# Patient Record
Sex: Male | Born: 1965 | Race: Black or African American | Hispanic: No | Marital: Single | Smoking: Current every day smoker
Health system: Southern US, Community
[De-identification: ages and names within clinical notes are randomized; demographics above are authoritative.]

## PROBLEM LIST (undated history)

## (undated) ENCOUNTER — Emergency Department (HOSPITAL_BASED_OUTPATIENT_CLINIC_OR_DEPARTMENT_OTHER): Admission: EM | Payer: MEDICAID

## (undated) DIAGNOSIS — K859 Acute pancreatitis without necrosis or infection, unspecified: Secondary | ICD-10-CM

## (undated) DIAGNOSIS — F419 Anxiety disorder, unspecified: Secondary | ICD-10-CM

## (undated) DIAGNOSIS — F32A Depression, unspecified: Secondary | ICD-10-CM

## (undated) DIAGNOSIS — E119 Type 2 diabetes mellitus without complications: Secondary | ICD-10-CM

## (undated) DIAGNOSIS — R45851 Suicidal ideations: Secondary | ICD-10-CM

## (undated) HISTORY — DX: Suicidal ideations: R45.851

---

## 2009-04-23 ENCOUNTER — Ambulatory Visit: Payer: Self-pay | Admitting: Internal Medicine

## 2010-11-06 ENCOUNTER — Inpatient Hospital Stay: Payer: Self-pay | Admitting: Internal Medicine

## 2011-01-13 ENCOUNTER — Ambulatory Visit: Payer: Self-pay | Admitting: Family Medicine

## 2011-01-20 ENCOUNTER — Ambulatory Visit: Payer: Self-pay | Admitting: Family Medicine

## 2011-02-20 ENCOUNTER — Ambulatory Visit: Payer: Self-pay | Admitting: Family Medicine

## 2011-06-26 ENCOUNTER — Emergency Department: Payer: Self-pay | Admitting: Emergency Medicine

## 2011-07-08 ENCOUNTER — Ambulatory Visit: Payer: Self-pay | Admitting: Adult Health

## 2011-07-08 DIAGNOSIS — R002 Palpitations: Secondary | ICD-10-CM

## 2011-11-19 ENCOUNTER — Inpatient Hospital Stay: Payer: Self-pay | Admitting: Internal Medicine

## 2011-11-23 LAB — LIPASE, BLOOD: Lipase: 319 U/L (ref 73–393)

## 2012-03-01 ENCOUNTER — Emergency Department: Payer: Self-pay | Admitting: Emergency Medicine

## 2012-03-01 LAB — CBC
MCH: 32.4 pg (ref 26.0–34.0)
RBC: 4.46 10*6/uL (ref 4.40–5.90)
WBC: 14.7 10*3/uL — ABNORMAL HIGH (ref 3.8–10.6)

## 2012-03-01 LAB — COMPREHENSIVE METABOLIC PANEL
Alkaline Phosphatase: 69 U/L (ref 50–136)
Anion Gap: 11 (ref 7–16)
BUN: 10 mg/dL (ref 7–18)
Chloride: 102 mmol/L (ref 98–107)
EGFR (African American): >60
Osmolality: 284 (ref 275–301)
Potassium: 3.6 mmol/L (ref 3.5–5.1)
Sodium: 135 mmol/L — ABNORMAL LOW (ref 136–145)
Total Protein: 8 g/dL (ref 6.4–8.2)

## 2012-03-01 LAB — LIPASE, BLOOD: Lipase: 83 U/L (ref 73–393)

## 2012-03-01 LAB — TROPONIN I: Troponin-I: <0.02 ng/mL

## 2012-04-11 ENCOUNTER — Emergency Department: Payer: Self-pay | Admitting: Unknown Physician Specialty

## 2012-04-11 LAB — COMPREHENSIVE METABOLIC PANEL
Albumin: 3.7 g/dL (ref 3.4–5.0)
Anion Gap: 9 (ref 7–16)
BUN: 8 mg/dL (ref 7–18)
Bilirubin,Total: 0.1 mg/dL — ABNORMAL LOW (ref 0.2–1.0)
Chloride: 110 mmol/L — ABNORMAL HIGH (ref 98–107)
Co2: 24 mmol/L (ref 21–32)
Creatinine: 0.7 mg/dL (ref 0.60–1.30)
EGFR (African American): >60
EGFR (Non-African Amer.): >60
Glucose: 161 mg/dL — ABNORMAL HIGH (ref 65–99)
Potassium: 3.7 mmol/L (ref 3.5–5.1)
Sodium: 143 mmol/L (ref 136–145)
Total Protein: 7.7 g/dL (ref 6.4–8.2)

## 2012-04-11 LAB — CBC
HCT: 43.8 % (ref 40.0–52.0)
HGB: 14.6 g/dL (ref 13.0–18.0)
MCHC: 33.2 g/dL (ref 32.0–36.0)
MCV: 96 fL (ref 80–100)
RBC: 4.56 10*6/uL (ref 4.40–5.90)

## 2012-06-20 ENCOUNTER — Emergency Department: Payer: Self-pay | Admitting: Emergency Medicine

## 2012-09-12 ENCOUNTER — Emergency Department: Payer: Self-pay

## 2012-09-13 ENCOUNTER — Inpatient Hospital Stay: Payer: Self-pay | Admitting: Internal Medicine

## 2012-09-13 LAB — URINALYSIS, COMPLETE
Bilirubin,UR: NEGATIVE
Glucose,UR: NEGATIVE mg/dL (ref 0–75)
Protein: 30
RBC,UR: 6 /HPF (ref 0–5)
Specific Gravity: 1.03 (ref 1.003–1.030)
Squamous Epithelial: NONE SEEN
WBC UR: 1 /HPF (ref 0–5)

## 2012-09-13 LAB — CBC WITH DIFFERENTIAL/PLATELET
Basophil #: 0.1 10*3/uL (ref 0.0–0.1)
Basophil %: 0.4 %
Eosinophil #: 0 10*3/uL (ref 0.0–0.7)
Eosinophil %: 0.1 %
HCT: 45.2 % (ref 40.0–52.0)
HGB: 15.7 g/dL (ref 13.0–18.0)
Lymphocyte #: 1.9 10*3/uL (ref 1.0–3.6)
MCH: 33.1 pg (ref 26.0–34.0)
MCHC: 34.8 g/dL (ref 32.0–36.0)
Monocyte #: 0.7 x10 3/mm (ref 0.2–1.0)
Neutrophil #: 11.9 10*3/uL — ABNORMAL HIGH (ref 1.4–6.5)
Neutrophil %: 82.2 %
RDW: 12.9 % (ref 11.5–14.5)

## 2012-09-13 LAB — COMPREHENSIVE METABOLIC PANEL
Albumin: 4 g/dL (ref 3.4–5.0)
Alkaline Phosphatase: 80 U/L (ref 50–136)
BUN: 14 mg/dL (ref 7–18)
Calcium, Total: 9 mg/dL (ref 8.5–10.1)
Co2: 22 mmol/L (ref 21–32)
EGFR (African American): >60
EGFR (Non-African Amer.): >60
Glucose: 120 mg/dL — ABNORMAL HIGH (ref 65–99)
Potassium: 3.8 mmol/L (ref 3.5–5.1)
SGOT(AST): 25 U/L (ref 15–37)
SGPT (ALT): 32 U/L (ref 12–78)
Total Protein: 8.6 g/dL — ABNORMAL HIGH (ref 6.4–8.2)

## 2012-09-13 LAB — LIPASE, BLOOD: Lipase: 1351 U/L — ABNORMAL HIGH (ref 73–393)

## 2012-09-14 LAB — LIPASE, BLOOD: Lipase: 2718 U/L — ABNORMAL HIGH (ref 73–393)

## 2012-09-14 LAB — COMPREHENSIVE METABOLIC PANEL
Alkaline Phosphatase: 78 U/L (ref 50–136)
Anion Gap: 9 (ref 7–16)
BUN: 10 mg/dL (ref 7–18)
Bilirubin,Total: 0.5 mg/dL (ref 0.2–1.0)
Calcium, Total: 8.7 mg/dL (ref 8.5–10.1)
Chloride: 103 mmol/L (ref 98–107)
Co2: 25 mmol/L (ref 21–32)
Creatinine: 0.63 mg/dL (ref 0.60–1.30)
EGFR (African American): >60
Osmolality: 275 (ref 275–301)
Potassium: 3.6 mmol/L (ref 3.5–5.1)
Sodium: 137 mmol/L (ref 136–145)

## 2012-09-14 LAB — CBC WITH DIFFERENTIAL/PLATELET
Basophil #: 0 10*3/uL (ref 0.0–0.1)
Basophil %: 0.2 %
Eosinophil #: 0 10*3/uL (ref 0.0–0.7)
HCT: 40.7 % (ref 40.0–52.0)
HGB: 14.1 g/dL (ref 13.0–18.0)
Lymphocyte %: 11.9 %
MCHC: 34.7 g/dL (ref 32.0–36.0)
Monocyte %: 7.7 %
Neutrophil #: 10.5 10*3/uL — ABNORMAL HIGH (ref 1.4–6.5)
Platelet: 193 10*3/uL (ref 150–440)
RDW: 12.8 % (ref 11.5–14.5)
WBC: 13.2 10*3/uL — ABNORMAL HIGH (ref 3.8–10.6)

## 2013-05-04 ENCOUNTER — Emergency Department: Payer: Self-pay | Admitting: Emergency Medicine

## 2013-05-04 LAB — BASIC METABOLIC PANEL
Anion Gap: 8 (ref 7–16)
Chloride: 105 mmol/L (ref 98–107)
Co2: 24 mmol/L (ref 21–32)
EGFR (African American): >60
EGFR (Non-African Amer.): >60
Potassium: 3.5 mmol/L (ref 3.5–5.1)

## 2013-05-04 LAB — CBC
HCT: 42.5 % (ref 40.0–52.0)
MCHC: 34.4 g/dL (ref 32.0–36.0)
MCV: 94 fL (ref 80–100)
RBC: 4.51 10*6/uL (ref 4.40–5.90)
RDW: 13.2 % (ref 11.5–14.5)
WBC: 19.3 10*3/uL — ABNORMAL HIGH (ref 3.8–10.6)

## 2013-05-04 LAB — ETHANOL: Ethanol: 142 mg/dL

## 2013-05-17 ENCOUNTER — Emergency Department: Payer: Self-pay | Admitting: Emergency Medicine

## 2013-05-17 LAB — COMPREHENSIVE METABOLIC PANEL
Anion Gap: 7 (ref 7–16)
BUN: 16 mg/dL (ref 7–18)
Bilirubin,Total: 0.6 mg/dL (ref 0.2–1.0)
Chloride: 96 mmol/L — ABNORMAL LOW (ref 98–107)
Co2: 27 mmol/L (ref 21–32)
Creatinine: 0.87 mg/dL (ref 0.60–1.30)
EGFR (Non-African Amer.): >60
Glucose: 273 mg/dL — ABNORMAL HIGH (ref 65–99)
Osmolality: 272 (ref 275–301)
SGPT (ALT): 34 U/L (ref 12–78)
Sodium: 130 mmol/L — ABNORMAL LOW (ref 136–145)
Total Protein: 8.9 g/dL — ABNORMAL HIGH (ref 6.4–8.2)

## 2013-05-17 LAB — URINALYSIS, COMPLETE
Bilirubin,UR: NEGATIVE
Nitrite: NEGATIVE
Protein: 100
RBC,UR: 3 /HPF (ref 0–5)
Specific Gravity: 1.042 (ref 1.003–1.030)

## 2013-05-17 LAB — CBC
MCH: 32.4 pg (ref 26.0–34.0)
MCHC: 35.2 g/dL (ref 32.0–36.0)
MCV: 92 fL (ref 80–100)
RBC: 5.05 10*6/uL (ref 4.40–5.90)

## 2013-10-23 ENCOUNTER — Emergency Department: Payer: Self-pay | Admitting: Emergency Medicine

## 2013-10-23 LAB — DRUG SCREEN, URINE
Amphetamines, Ur Screen: NEGATIVE (ref ?–1000)
Benzodiazepine, Ur Scrn: NEGATIVE (ref ?–200)
Cannabinoid 50 Ng, Ur ~~LOC~~: NEGATIVE (ref ?–50)
MDMA (Ecstasy)Ur Screen: NEGATIVE (ref ?–500)
Opiate, Ur Screen: NEGATIVE (ref ?–300)
Phencyclidine (PCP) Ur S: NEGATIVE (ref ?–25)
Tricyclic, Ur Screen: NEGATIVE (ref ?–1000)

## 2013-10-23 LAB — URINALYSIS, COMPLETE
Bilirubin,UR: NEGATIVE
Glucose,UR: >=500 mg/dL (ref 0–75)
Ketone: NEGATIVE
Ph: 6 (ref 4.5–8.0)
Specific Gravity: 1.037 (ref 1.003–1.030)
Squamous Epithelial: <1

## 2013-10-23 LAB — ETHANOL
Ethanol %: 0.027 % (ref 0.000–0.080)
Ethanol: 27 mg/dL

## 2013-10-23 LAB — COMPREHENSIVE METABOLIC PANEL
Albumin: 3.5 g/dL (ref 3.4–5.0)
Alkaline Phosphatase: 73 U/L
Anion Gap: 10 (ref 7–16)
Bilirubin,Total: 0.3 mg/dL (ref 0.2–1.0)
Calcium, Total: 9.2 mg/dL (ref 8.5–10.1)
Creatinine: 0.63 mg/dL (ref 0.60–1.30)
EGFR (African American): >60
Osmolality: 282 (ref 275–301)
Potassium: 3.5 mmol/L (ref 3.5–5.1)
SGOT(AST): 13 U/L — ABNORMAL LOW (ref 15–37)
Total Protein: 7.6 g/dL (ref 6.4–8.2)

## 2013-10-23 LAB — CBC
HCT: 41.2 % (ref 40.0–52.0)
MCH: 31.7 pg (ref 26.0–34.0)
MCHC: 33.9 g/dL (ref 32.0–36.0)
MCV: 94 fL (ref 80–100)
RBC: 4.4 10*6/uL (ref 4.40–5.90)
RDW: 13.5 % (ref 11.5–14.5)
WBC: 9 10*3/uL (ref 3.8–10.6)

## 2013-10-23 LAB — LIPASE, BLOOD: Lipase: 109 U/L (ref 73–393)

## 2014-08-02 ENCOUNTER — Emergency Department: Payer: Self-pay | Admitting: Emergency Medicine

## 2014-08-02 LAB — CBC
HCT: 47.1 % (ref 40.0–52.0)
HGB: 15.1 g/dL (ref 13.0–18.0)
MCH: 30.9 pg (ref 26.0–34.0)
MCHC: 32 g/dL (ref 32.0–36.0)
MCV: 96 fL (ref 80–100)
Platelet: 236 10*3/uL (ref 150–440)
RBC: 4.88 10*6/uL (ref 4.40–5.90)
RDW: 14.3 % (ref 11.5–14.5)
WBC: 12.5 10*3/uL — AB (ref 3.8–10.6)

## 2014-08-02 LAB — COMPREHENSIVE METABOLIC PANEL
ALBUMIN: 3.6 g/dL (ref 3.4–5.0)
ALT: 132 U/L — AB
ANION GAP: 12 (ref 7–16)
Alkaline Phosphatase: 82 U/L
BUN: 11 mg/dL (ref 7–18)
Bilirubin,Total: 0.2 mg/dL (ref 0.2–1.0)
CO2: 21 mmol/L (ref 21–32)
Calcium, Total: 8.6 mg/dL (ref 8.5–10.1)
Chloride: 109 mmol/L — ABNORMAL HIGH (ref 98–107)
Creatinine: 0.92 mg/dL (ref 0.60–1.30)
EGFR (African American): >60
GLUCOSE: 204 mg/dL — AB (ref 65–99)
Osmolality: 288 (ref 275–301)
POTASSIUM: 4.2 mmol/L (ref 3.5–5.1)
SGOT(AST): 79 U/L — ABNORMAL HIGH (ref 15–37)
Sodium: 142 mmol/L (ref 136–145)
Total Protein: 8.3 g/dL — ABNORMAL HIGH (ref 6.4–8.2)

## 2014-08-02 LAB — ETHANOL: Ethanol: 299 mg/dL

## 2014-08-02 LAB — LIPASE, BLOOD: Lipase: 141 U/L (ref 73–393)
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
## 2015-01-01 ENCOUNTER — Emergency Department: Payer: Self-pay | Admitting: Emergency Medicine

## 2015-03-10 NOTE — Discharge Summary (Signed)
PATIENT NAME:  Samuel [name], Samuel [name] MR#:  [id number] DATE OF BIRTH:  09-15-1966  DATE OF ADMISSION:  09/13/2012 DATE OF DISCHARGE:  09/15/2012  PRIMARY CARE PHYSICIAN: [facility]  DISCHARGE DIAGNOSES:  1. Acute alcoholic pancreatitis.  2. Diabetes mellitus, type 2.  3. Alcohol abuse.  4. Leukocytosis. 5. Tobacco abuse.   IMAGING STUDIES DONE: None.   CONSULTANTS: None.   ADMITTING HISTORY AND PHYSICAL: Please see detailed history and physical dictated by Dr. Vianne Bulls. In brief, 49 year old male patient presented to the Emergency Room complaining of acute onset of mid epigastric pain along with nausea, vomiting. In the ER and he was found to have a lipase of 1400, was admitted to the hospitalist service. Patient had alcohol abuse in the past, quit alcohol two months back but started to binge drink again and had the pancreatitis. Patient's LFTs were normal.   HOSPITAL COURSE:  1. Acute alcoholic pancreatitis. Patient was initially n.p.o., started on IV fluid hydration. On day two of hospitalization patients lipase elevated to over 2000. Patient was later started on liquid diet and on the day of discharge his lipase is trending down. His symptoms are much improved. He has tolerated oral food with no nausea, vomiting, minimal pain and is being discharged home in fair condition.  2. Patient's diabetes mellitus type 2 was well controlled. He is on metformin at home which will be continued.  3. Leukocytosis. Patient did have elevated white count of 13,000 during admission which has trended down with hydration. This was likely secondary to stress reaction and dehydration.  4. At the time of discharge patient's temperature 98, pulse 75, blood pressure 130/82. He has tolerated food. Abdominal examination has mild tenderness in the epigastric area with no rigidity, guarding. Bowel sounds present and he is being discharged home to follow up with his primary care physician.    DISCHARGE MEDICATIONS:   1. Percocet 10/325, 1 tablet oral 4 times a day as needed, 15 pills given.  2. Zofran ODT 4 mg oral 4 times a day as needed.  3. Metformin 500 mg oral 2 times a day.   DISCHARGE INSTRUCTIONS: Follow up with primary care physician in 1 to 2 weeks. Patient has been counseled to stay away from alcohol. He is to call his doctor or return to the Emergency Room if he has any worsening of symptoms. He will be on a diabetic diet with no restriction in activity. This plan was discussed with the patient who has verbalized understanding and is okay with the plan.   TIME SPENT: Time spent today on this discharge dictation along with coordinating care and counseling of the patient was 35 minutes.  ____________________________ Leia Alf Zanayah Shadowens, MD srs:cms D: 09/15/2012 13:29:20 ET T: 09/16/2012 15:41:03 ET JOB#: [id number]  cc: Alveta Heimlich R. Hero Kulish, MD, <Dictator> [facility] Neita Carp MD ELECTRONICALLY SIGNED 09/17/2012 13:58

## 2015-03-10 NOTE — H&P (Signed)
PATIENT NAME:  Samuel [name], Samuel [name] MR#:  [id number] DATE OF BIRTH:  09-Apr-1966  DATE OF ADMISSION:  09/13/2012  PRIMARY DOCTOR: None local.   ER PHYSICIAN: Dr. Benjaman Lobe   CHIEF COMPLAINT: Abdominal pain.   HISTORY OF PRESENT ILLNESS: The patient is a 49 year old male who started to have abdominal pain since this morning mainly in the midepigastric region radiating to the back associated with nausea and vomiting. The pain is increased by food intake and is partly relieved with pain medication given in the ER. The patient was here yesterday with right shoulder pain and was given ketorolac. The patient says that after taking the medicine he felt more nauseous and started to have abdominal pain. The patient also says he has been drinking heavily for the last three days including both liquor and wine. The patient had history of alcohol related pancreatitis before also. The patient's LFTs are normal. I was asked to admit the patient for acute pancreatitis.   PAST MEDICAL HISTORY: Significant for diabetes. No hypertension. Past medical history also includes admission December 29th to January 2nd for alcohol related pancreatitis.   ALLERGIES: Penicillin.   SOCIAL HISTORY: Smokes less than a pack of cigarettes a day. Heavy drinker, drinking for the last 3 to 4 days. No drugs. The patient works at [employer].   PAST SURGICAL HISTORY: No history of operations.   FAMILY HISTORY: Hypertension and diabetes in both father and mother.   MEDICATIONS: Metformin 500 mg p.o. b.i.d.   REVIEW OF SYSTEMS: CONSTITUTIONAL: Has no fever. No fatigue. EYES: No blurred vision. ENT: No tinnitus. No ear pain. No epistaxis. No difficulty swallowing. RESPIRATORY: No cough. No wheezing. CARDIOVASCULAR: No chest pain. No orthopnea. GI: Has nausea and abdominal pain. The patient has no jaundice. No constipation. Had a normal bowel movement this morning. GU: No dysuria. ENDOCRINE: No polyuria or nocturia. The patient has diabetes but no  increased sweating or cold intolerance. HEMATOLOGIC: No anemia. INTEGUMENTARY: No skin rashes. MUSCULOSKELETAL: Complains of right shoulder pain. NEUROLOGIC: No numbness or weakness. PSYCH: No anxiety or insomnia.   PHYSICAL EXAMINATION:   VITALS: Temperature 98.2, pulse 70, respirations 20, blood pressure 134/66, saturation 98% on room air.   GENERAL: Alert, awake, oriented in mild distress secondary to abdominal pain.   HEENT: Head atraumatic, normocephalic. Pupils equally reacting to light. Extraocular movements are intact.   ENT: No tympanic membrane congestion. No turbinate hypertrophy. No oropharyngeal erythema.   NECK: Normal range of motion. No JVD. No. No carotid bruit.   CARDIOVASCULAR: S1, S2 regular. No murmurs. PMI not displaced.   LUNGS: Clear to auscultation. No wheeze. No rales.   ABDOMEN: Abdomen is tender in the midepigastric region. Bowel sounds are diminished. No organomegaly. No hernia. Not distended.   EXTREMITIES: No cyanosis. No clubbing. No edema.    NEUROLOGIC: Alert, awake, oriented. No focal neurological deficit. Cranial nerves II through XII intact. Power 5/5 in upper and lower extremities. Sensation intact. Deep tendon reflexes 2+ bilaterally.   PSYCH: Mood and affect are within normal limits.   LABORATORY DATA: WBC elevated at 14.5, hemoglobin 15.7, hematocrit 45.2, platelets 223. Electrolytes sodium 140, potassium 3.8, chloride 106, bicarb 22, BUN 14, creatinine 0.81, glucose 120. Lipase 1351.   ASSESSMENT AND PLAN:  1. The patient is a 49 year old male with abdominal pain and elevated lipase with heavy alcohol abuse for the last three days, has acute alcoholic pancreatitis. The patient will be admitted to the hospitalist service. Keep him n.p.o. IV fluids. Check lipase in the  morning. Continue IV pain medication along with Zofran.  2. Elevated white count. Will empirically start him on meropenem and check the CBC tomorrow.  3. Diabetes mellitus. Hold  metformin because he is not eating and he is n.p.o. Continue sliding scale with coverage.  4. GI prophylaxis with Protonix.  5. Counseled the patient against drinking heavily. He has had two episodes of pancreatitis, one in January and one now. Explained that pancreatitis is coming because of his alcohol abuse. LFTs are normal. 6. Tobacco abuse. Counseled against smoking also for 3 to 10 minutes. Discussed risk of COPD and lung cancer risk involved with smoking.   TIME SPENT ON HISTORY AND PHYSICAL: About 60 minutes.   ____________________________ Epifanio Lesches, MD sk:drc D: 09/13/2012 19:54:53 ET T: 09/14/2012 06:34:33 ET JOB#: [id number]  cc: Epifanio Lesches, MD, <Dictator> Epifanio Lesches MD ELECTRONICALLY SIGNED 10/02/2012 19:37

## 2015-03-15 NOTE — H&P (Signed)
PATIENT NAME:  Samuel [name], Samuel [name] MR#:  [id number] DATE OF BIRTH:  [birth year]  DATE OF ADMISSION:  11/19/2011  CHIEF COMPLAINT: Abdominal pain.   HISTORY OF PRESENT ILLNESS: Mr. 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 His last alcoholic drink was two days prior to admission, at which time he drank rum. He has not been abstinent from alcohol despite prior hospitalizations for pancreatitis. He does drink beer occasionally but has historically avoided liquor with the misconception that liquor and beer were processed differently by the body. Several days ago, he had several drinks of Bacardi with his family. The following morning he developed abdominal pain, was still able to eat until the day prior to admission when he developed vomiting and vomited several times at home. He felt shaky and tremulous one day prior to admission, but today he denies any symptoms of such. He has no history of DTs.   PRIMARY CARE PHYSICIAN: He is a patient at the [facility], was last seen there this summer.   PAST MEDICAL HISTORY:  1. 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  2. History of alcohol abuse and alcoholic pancreatitis.  3. History of gastroesophageal reflux disease.  4. Tobacco abuse.  5. His last hospitalization was 12/17 through 46/96/2952 for alcoholic pancreatitis.   MEDICATIONS: He takes no medications. He was on metformin until this summer when it was discontinued.   PAST SURGICAL HISTORY: He has no past surgical history.   ALLERGIES:  He has no known drug allergies.   FAMILY HISTORY: Family history is notable for mom who is healthy except for a mild anemia and arthritis. She has diet-controlled diabetes as well. His father died of stomach cancer.  SOCIAL HISTORY: He is  a 30 pack-year smoker. He continues to drink alcohol.   REVIEW OF SYSTEMS: CONSTITUTIONAL: Negative for fever, fatigue, and weakness. No recent weight loss or weight gain. HEENT: He denies any changes in vision. He has no history of cataracts or glaucoma. He denies any history of seasonal rhinitis, snoring, postnasal drip or sinus pain. RESPIRATORY: He denies any history of cough, wheezing, or hemoptysis recently. CARDIOVASCULAR: He denies chest pain, orthopnea, edema, and dyspnea on exertion. GASTROINTESTINAL: He does note abdominal pain and nausea and vomiting, per history of present illness. No changes in bowel habits. GENITOURINARY: No history of dysuria, hematuria, or renal calculi. ENDOCRINE: He has no history of thyroid problems or nocturia. HEMATOLOGIC/LYMPHATIC: He has a history of mild anemia but with no history of bruising or bleeding. MUSCULOSKELETAL: He has no history of chronic neck, back, shoulder, knee or hip pain. NEUROLOGICAL: No numbness, weakness, or dysarthria. No history of seizures. PSYCHIATRIC: No history of anxiety or insomnia.   PHYSICAL EXAMINATION:  GENERAL: The patient is a well nourished, well developed middle-aged male in no apparent distress.   VITAL SIGNS: Blood pressure 135/77, pulse is 74 and regular, temperature is 98.5, respirations are 18, and he is sating 99% on room air. He is 5 feet 8 inches tall, and his current weight is 195.   HEENT: Pupils are equal, round, and reactive to light. Extraocular movements are intact. Sclerae are anicteric. Mouth is dry with halitosis.   NECK: Supple without lymphadenopathy, JVD, or thyromegaly.   LUNGS: Clear to auscultation bilaterally. No wheezing or rhonchi.   CARDIOVASCULAR: Regular rate and  rhythm with no murmurs, rubs, or gallops. Chest wall is nontender. He has good pedal pulses and no lower extremity edema.   ABDOMEN: Soft, tender in the mid epigastrium region without guarding or rebound. No masses appreciated. Bowel  sounds are very quiet.   MUSCULOSKELETAL: Exam is nonfocal. Gait was not tested.   SKIN: Skin is warm and dry without rashes or lesions.   LYMPH: There is no cervical, axillary, or inguinal lymphadenopathy.   NEUROLOGICAL: He is neurologically intact. He is alert and oriented to person, place, and time.   ADMISSION LABORATORY DATA: Sodium 139, potassium 3.7, chloride 98, bicarbonate 29, BUN 12, creatinine 0.86, glucose 164, white count 19.9, hemoglobin 16, platelets 234. Hepatic enzymes are normal. Lipase is elevated at 3027.   ASSESSMENT AND PLAN:  1. Alcoholic pancreatitis: We will admit to a Med/Surg bed and make n.p.o. except for ice chips and continue n.p.o. status until abdominal pain improves. A CT scan has not been ordered given the nature of his illness. We reserve that for worsening symptoms. We will use Dilaudid for pain control and IV Protonix for reflux symptoms.  2. History of alcohol abuse: Since he did note some tremors yesterday, we will place him on IV CIWA protocol,  3. Reflux: Again, Protonix IV.  4. History of diabetes: We will place the patient on sliding scale insulin and check hemoglobin A1c in the morning.  5. Alcohol and tobacco abuse: Counseling given today. His more immediate problem is alcohol abuse and his need for complete abstinence from all alcohol. He understands this now, and his mother was present and is supportive of his recovery.  6. Tobacco abuse: Counseling was given; however, given his current symptoms he is unlikely to give up tobacco at the same time as alcohol. He has requested a NicoDerm patch here in the ED.   ESTIMATED TIME OF CARE: 40 minutes.   ____________________________ Deborra Medina, MD tt:cbb D: 11/19/2011 21:52:47 ET T: 11/20/2011 10:41:41 ET JOB#: [id number]  cc: Deborra Medina, MD, <Dictator> [facility] Deborra Medina MD ELECTRONICALLY SIGNED 12/23/2011 18:21

## 2015-03-15 NOTE — Discharge Summary (Signed)
PATIENT NAME:  Samuel [name], Samuel [name] MR#:  [id number] DATE OF BIRTH:  [birth year]  DATE OF ADMISSION:  11/19/2011 DATE OF DISCHARGE:  11/23/2011  DISCHARGE DIAGNOSES:  1. Acute alcoholic pancreatitis, now resolved. 2. Alcoholism, counseled, not ready to stop completely yet.   SECONDARY DIAGNOSES:  1. Diabetes.  2. History of alcohol abuse and alcoholic pancreatitis.  3. Gastroesophageal reflux disease.   CONSULTATIONS: None.   LABORATORY, DIAGNOSTIC AND RADIOLOGICAL DATA: Abdominal ultrasound on 12/30 showed no cholelithiasis or acute cholecystitis.   HISTORY AND SHORT HOSPITAL COURSE: The patient is a 49 year old male with above-mentioned medical problems who was admitted for alcoholic pancreatitis. He was made n.p.o. and was started on pain control medication with Dilaudid. He was also placed on CIWA protocol. He was slowly started on advancing diet from clear liquid to full liquid and tolerated it well, although he did have one time flare while in the hospital and was again placed back as n.p.o., although his lipase continued to trend in a positive direction with an admission lipase of 3027 which was normalized on the date of discharge with a value of 319. He tolerated the diet well. On 01/02, he did not have much pain. His hemoglobin A1c was 6.7 and was doing much better on 01/02 and was discharged home in stable condition.   PERTINENT PHYSICAL EXAMINATION ON THE DATE OF DISCHARGE: On the date of discharge, his vital signs were as follows: Temperature 98, heart rate 106 per minute, respirations 20 per minute, blood pressure 131/83. He was saturating 95% on room air. CARDIOVASCULAR: S1, S2 normal. No murmur, rubs, or gallop. LUNGS: Clear to auscultation bilaterally. No wheezing, rales, rhonchi, or crepitation. ABDOMEN: Soft, benign. NEUROLOGIC: Nonfocal examination. All other physical examination remained at baseline.   DISCHARGE MEDICATIONS:  1. TUMS 500 mg chewable once daily as needed.   2. Tramadol 50 mg p.o. every six hours as needed, 15 tablets provided. No refill.   DISCHARGE DIET: Low sodium, 1,800 ADA. He was requested to eat light meal for the first time after which gradually advance as tolerated.   DISCHARGE ACTIVITY: As tolerated.   DISCHARGE INSTRUCTIONS AND FOLLOW-UP:  1. The patient was instructed to avoid fatty foods.  2. He was requested to follow up with [facility] with his primary care physician in 1 to 2 weeks.   TOTAL TIME DISCHARGING THIS PATIENT: 45 minutes.    ____________________________ Samuel Mellow. Manuella Ghazi, MD vss:ap D: 11/28/2011 11:28:18 ET T: 11/29/2011 13:09:06 ET JOB#: [id number]  cc: Mallarie Voorhies S. Manuella Ghazi, MD, <Dictator> [facility] Samuel Mellow [facility] MD ELECTRONICALLY SIGNED 11/29/2011 19:52

## 2015-04-02 ENCOUNTER — Encounter: Payer: Self-pay | Admitting: Emergency Medicine

## 2015-04-02 ENCOUNTER — Emergency Department
Admission: EM | Admit: 2015-04-02 | Discharge: 2015-04-02 | Disposition: A | Discharge status: Home / Self Care | Payer: Self-pay | Attending: Emergency Medicine | Admitting: Emergency Medicine

## 2015-04-02 DIAGNOSIS — Z880 Allergy status to penicillin: Secondary | ICD-10-CM | POA: Insufficient documentation

## 2015-04-02 DIAGNOSIS — H538 Other visual disturbances: Secondary | ICD-10-CM | POA: Insufficient documentation

## 2015-04-02 DIAGNOSIS — E1165 Type 2 diabetes mellitus with hyperglycemia: Secondary | ICD-10-CM | POA: Insufficient documentation

## 2015-04-02 DIAGNOSIS — Z720 Tobacco use: Secondary | ICD-10-CM | POA: Insufficient documentation

## 2015-04-02 DIAGNOSIS — R739 Hyperglycemia, unspecified: Secondary | ICD-10-CM

## 2015-04-02 HISTORY — DX: Type 2 diabetes mellitus without complications: E11.9

## 2015-04-02 HISTORY — DX: Acute pancreatitis without necrosis or infection, unspecified: K85.90

## 2015-04-02 LAB — URINALYSIS COMPLETE WITH MICROSCOPIC (ARMC ONLY)
BACTERIA UA: NONE SEEN
Bilirubin Urine: NEGATIVE
Glucose, UA: 50 mg/dL — AB
KETONES UR: NEGATIVE mg/dL
Leukocytes, UA: NEGATIVE
Nitrite: NEGATIVE
Protein, ur: NEGATIVE mg/dL
Specific Gravity, Urine: 1.014 (ref 1.005–1.030)
Squamous Epithelial / LPF: NONE SEEN
pH: 5 (ref 5.0–8.0)

## 2015-04-02 LAB — GLUCOSE, CAPILLARY: GLUCOSE-CAPILLARY: 189 mg/dL — AB (ref 65–99)

## 2015-04-02 LAB — CBC
HEMATOCRIT: 41.4 % (ref 40.0–52.0)
HEMOGLOBIN: 14.1 g/dL (ref 13.0–18.0)
MCH: 32.2 pg (ref 26.0–34.0)
MCHC: 34.1 g/dL (ref 32.0–36.0)
MCV: 94.4 fL (ref 80.0–100.0)
Platelets: 222 10*3/uL (ref 150–440)
RBC: 4.38 MIL/uL — AB (ref 4.40–5.90)
RDW: 12.8 % (ref 11.5–14.5)
WBC: 8.3 10*3/uL (ref 3.8–10.6)

## 2015-04-02 LAB — COMPREHENSIVE METABOLIC PANEL
ALT: 18 U/L (ref 17–63)
AST: 23 U/L (ref 15–41)
Albumin: 3.8 g/dL (ref 3.5–5.0)
Alkaline Phosphatase: 66 U/L (ref 38–126)
Anion gap: 8 (ref 5–15)
BILIRUBIN TOTAL: 0.4 mg/dL (ref 0.3–1.2)
BUN: 11 mg/dL (ref 6–20)
CO2: 26 mmol/L (ref 22–32)
Calcium: 9.1 mg/dL (ref 8.9–10.3)
Chloride: 103 mmol/L (ref 101–111)
Creatinine, Ser: 0.66 mg/dL (ref 0.61–1.24)
GFR calc Af Amer: >60 mL/min (ref >60)
Glucose, Bld: 223 mg/dL — ABNORMAL HIGH (ref 65–99)
Potassium: 3.8 mmol/L (ref 3.5–5.1)
SODIUM: 137 mmol/L (ref 135–145)
Total Protein: 7.6 g/dL (ref 6.5–8.1)

## 2015-04-02 MED ORDER — METFORMIN HCL 500 MG PO TABS: 500.0000 mg | ORAL_TABLET | Freq: Two times a day (BID) | ORAL | Status: DC

## 2015-04-02 MED ORDER — METFORMIN HCL 500 MG PO TABS
ORAL_TABLET | ORAL | Status: AC
  Administered 2015-04-02: 500 mg via ORAL
  Filled 2015-04-02: qty 1

## 2015-04-02 MED ORDER — METFORMIN HCL 500 MG PO TABS
500.0000 mg | ORAL_TABLET | Freq: Once | ORAL | Status: AC
  Administered 2015-04-02: 500 mg via ORAL

## 2015-04-02 MED ORDER — SODIUM CHLORIDE 0.9 % IV SOLN
Freq: Once | INTRAVENOUS | Status: AC
  Administered 2015-04-02: 13:00:00 via INTRAVENOUS

## 2015-04-02 NOTE — ED Notes (Signed)
Awaiting IV fluids to finish infusing before discharge.

## 2015-04-02 NOTE — ED Notes (Signed)
Pt states that he has been "feeling foggy" X 2 days. Denies CP, denies SOB. States vision has been blurry. Family gave him metformin today. Hx of diabetes. Pt alert and oriented X4, active, cooperative, pt in NAD. RR even and unlabored, color WNL. No neuro deficits

## 2015-04-02 NOTE — Discharge Instructions (Signed)
Blood Glucose Monitoring °Monitoring your blood glucose (also know as blood sugar) helps you to manage your diabetes. It also helps you and your health care provider monitor your diabetes and determine how well your treatment plan is working. °WHY SHOULD YOU MONITOR YOUR BLOOD GLUCOSE? °· It can help you understand how food, exercise, and medicine affect your blood glucose. °· It allows you to know what your blood glucose is at any given moment. You can quickly tell if you are having low blood glucose (hypoglycemia) or high blood glucose (hyperglycemia). °· It can help you and your health care provider know how to adjust your medicines. °· It can help you understand how to manage an illness or adjust medicine for exercise. °WHEN SHOULD YOU TEST? °Your health care provider will help you decide how often you should check your blood glucose. This may depend on the type of diabetes you have, your diabetes control, or the types of medicines you are taking. Be sure to write down all of your blood glucose readings so that this information can be reviewed with your health care provider. See below for examples of testing times that your health care provider may suggest. °Type 1 Diabetes °· Test 4 times a day if you are in good control, using an insulin pump, or perform multiple daily injections. °· If your diabetes is not well controlled or if you are sick, you may need to monitor more often. °· It is a good idea to also monitor: °· Before and after exercise. °· Between meals and 2 hours after a meal. °· Occasionally between 2:00 a.m. and 3:00 a.m. °Type 2 Diabetes °· It can vary with each person, but generally, if you are on insulin, test 4 times a day. °· If you take medicines by mouth (orally), test 2 times a day. °· If you are on a controlled diet, test once a day. °· If your diabetes is not well controlled or if you are sick, you may need to monitor more often. °HOW TO MONITOR YOUR BLOOD GLUCOSE °Supplies  Needed °· Blood glucose meter. °· Test strips for your meter. Each meter has its own strips. You must use the strips that go with your own meter. °· A pricking needle (lancet). °· A device that holds the lancet (lancing device). °· A journal or log book to write down your results. °Procedure °· Wash your hands with soap and water. Alcohol is not preferred. °· Prick the side of your finger (not the tip) with the lancet. °· Gently milk the finger until a small drop of blood appears. °· Follow the instructions that come with your meter for inserting the test strip, applying blood to the strip, and using your blood glucose meter. °Other Areas to Get Blood for Testing °Some meters allow you to use other areas of your body (other than your finger) to test your blood. These areas are called alternative sites. The most common alternative sites are: °· The forearm. °· The thigh. °· The back area of the lower leg. °· The palm of the hand. °The blood flow in these areas is slower. Therefore, the blood glucose values you get may be delayed, and the numbers are different from what you would get from your fingers. Do not use alternative sites if you think you are having hypoglycemia. Your reading will not be accurate. Always use a finger if you are having hypoglycemia. Also, if you cannot feel your lows (hypoglycemia unawareness), always use your fingers for your   blood glucose checks. ADDITIONAL TIPS FOR GLUCOSE MONITORING  Do not reuse lancets.  Always carry your supplies with you.  All blood glucose meters have a 24-hour "hotline" number to call if you have questions or need help.  Adjust (calibrate) your blood glucose meter with a control solution after finishing a few boxes of strips. BLOOD GLUCOSE RECORD KEEPING It is a good idea to keep a daily record or log of your blood glucose readings. Most glucose meters, if not all, keep your glucose records stored in the meter. Some meters come with the ability to download  your records to your home computer. Keeping a record of your blood glucose readings is especially helpful if you are wanting to look for patterns. Make notes to go along with the blood glucose readings because you might forget what happened at that exact time. Keeping good records helps you and your health care provider to work together to achieve good diabetes management.  Document Released: 11/10/2003 Document Revised: 03/24/2014 Document Reviewed: 04/01/2013 [facility] Patient Information 2015 [place], [place]. This information is not intended to replace advice given to you by your health care provider. Make sure you discuss any questions you have with your health care provider.  High Blood Sugar High blood sugar (hyperglycemia) means that the level of sugar in your blood is higher than it should be. Signs of high blood sugar include:  Feeling thirsty.  Frequent peeing (urinating).  Feeling tired or sleepy.  Dry mouth.  Vision changes.  Feeling weak.  Feeling hungry but losing weight.  Numbness and tingling in your hands or feet.  Headache. When you ignore these signs, your blood sugar may keep going up. These problems may get worse, and other problems may begin. HOME CARE  Check your blood sugars as told by your doctor. Write down the numbers with the date and time.  Take the right amount of insulin or diabetes pills at the right time. Write down the dose with date and time.  Refill your insulin or diabetes pills before running out.  Watch what you eat. Follow your meal plan.  Drink liquids without sugar, such as water. Check with your doctor if you have kidney or heart disease.  Follow your doctor's orders for exercise. Exercise at the same time of day.  Keep your doctor's appointments. GET HELP RIGHT AWAY IF:   You have trouble thinking or are confused.  You have fast breathing with fruity smelling breath.  You pass out (faint).  You have 2 to 3 days of high blood  sugars and you do not know why.  You have chest pain.  You are feeling sick to your stomach (nauseous) or throwing up (vomiting).  You have sudden vision changes. MAKE SURE YOU:   Understand these instructions.  Will watch your condition.  Will get help right away if you are not doing well or get worse. Document Released: 09/04/2009 Document Revised: 01/30/2012 Document Reviewed: 09/04/2009 [facility] Patient Information 2015 [place], [place]. This information is not intended to replace advice given to you by your health care provider. Make sure you discuss any questions you have with your health care provider.

## 2015-04-02 NOTE — ED Provider Notes (Signed)
[facility] Emergency Department Provider Note    Time seen: 12 PM  I have reviewed the triage vital signs and the nursing notes.   HISTORY  Chief Complaint Altered Mental Status    HPI Samuel [name] is a 49 y.o. male ER for some blurry vision,he states like he just didn't feel quite right. He was concerned that his blood sugar was elevated. He had been out of his metformin for some time, but did take 1 dose yesterday. Denies any fevers chills chest pain shortness of breath nausea vomiting diarrhea.  Past Medical History  Diagnosis Date  . Diabetes mellitus without complication   . Pancreatitis unk    There are no active problems to display for this patient.   History reviewed. No pertinent past surgical history.  No current outpatient prescriptions on file.  Allergies Penicillins  History reviewed. No pertinent family history.  Social History History  Substance Use Topics  . Smoking status: Current Every Day Smoker  . Smokeless tobacco: Not on file  . Alcohol Use: Yes    Review of Systems Constitutional: Negative for fever. Eyes: Positive for somewhat blurry vision ENT: Negative for sore throat. Cardiovascular: Negative for chest pain. Respiratory: Negative for shortness of breath. Gastrointestinal: Negative for abdominal pain, vomiting and diarrhea. Genitourinary: Negative for dysuria. Musculoskeletal: Negative for back pain. Skin: Negative for rash. Neurological: Negative for headaches, positive for generalized weakness  10-point ROS otherwise negative.  ____________________________________________   PHYSICAL EXAM:  VITAL SIGNS: ED Triage Vitals  Enc Vitals Group     BP 04/02/15 0935 143/89 mmHg     Pulse Rate 04/02/15 0935 63     Resp 04/02/15 0935 20     Temp 04/02/15 0935 99.1 F (37.3 C)     Temp Source 04/02/15 0935 Oral     SpO2 04/02/15 0935 99 %     Weight 04/02/15 0935 172 lb (78.019 kg)     Height 04/02/15  0935 '5\' 8"'$  (1.727 m)     Head Cir --      Peak Flow --      Pain Score --      Pain Loc --      Pain Edu? --      Excl. in [place]? --     Constitutional: Alert and oriented. Well appearing and in no distress. Eyes: Conjunctivae are normal. PERRL. Normal extraocular movements. ENT   Head: Normocephalic and atraumatic.   Nose: No congestion/rhinnorhea.   Mouth/Throat: Mucous membranes are moist.   Neck: No stridor. Hematological/Lymphatic/Immunilogical: No cervical lymphadenopathy. Cardiovascular: Normal rate, regular rhythm. Normal and symmetric distal pulses are present in all extremities. No murmurs, rubs, or gallops. Respiratory: Normal respiratory effort without tachypnea nor retractions. Breath sounds are clear and equal bilaterally. No wheezes/rales/rhonchi. Gastrointestinal: Soft and nontender. No distention. No abdominal bruits. There is no CVA tenderness. Musculoskeletal: Nontender with normal range of motion in all extremities. No joint effusions.  No lower extremity tenderness nor edema. Neurologic:  Normal speech and language. No gross focal neurologic deficits are appreciated. Speech is normal. No gait instability. Skin:  Skin is warm, dry and intact. No rash noted. Psychiatric: Mood and affect are normal. Speech and behavior are normal. Patient exhibits appropriate insight and judgment.  ____________________________________________    LABS (pertinent positives/negatives)  Labs Reviewed  GLUCOSE, CAPILLARY - Abnormal; Notable for the following:    Glucose-Capillary 189 (*)    All other components within normal limits  CBC - Abnormal; Notable for the  following:    RBC 4.38 (*)    All other components within normal limits  COMPREHENSIVE METABOLIC PANEL - Abnormal; Notable for the following:    Glucose, Bld 223 (*)    All other components within normal limits  URINALYSIS COMPLETEWITH MICROSCOPIC ([facility])  - Abnormal; Notable for the following:    Color,  Urine YELLOW (*)    APPearance CLEAR (*)    Glucose, UA 50 (*)    Hgb urine dipstick 1+ (*)    All other components within normal limits  CBG MONITORING, ED     ____________________________________________    RADIOLOGY  None  ____________________________________________    ED COURSE  Pertinent labs & imaging results that were available during my care of the patient were reviewed by me and considered in my medical decision making (see chart for details).  Patient hyperglycemia due to medication noncompliance. He'll receive normal saline Glucophage.  FINAL ASSESSMENT AND PLAN  Hyperglycemia  Plan: Patient received fluids and Glucophage and will receive refill prescription for same for him. He is in no acute distress can follow-up as an outpatient    Earleen Newport, MD   Earleen Newport, MD 04/02/15 1228

## 2015-04-02 NOTE — ED Notes (Signed)
Pt alert and oriented X4, active, cooperative, pt in NAD. RR even and unlabored, color WNL.  Pt informed to return if any life threatening symptoms occur.   

## 2015-04-02 NOTE — ED Notes (Signed)
MD at bedside. 

## 2015-04-02 NOTE — ED Notes (Signed)
States he has been a little "foggy"  With some blurred vision The patient has a history of diabetes and has not checked sugar recently

## 2015-09-07 ENCOUNTER — Emergency Department
Admission: EM | Admit: 2015-09-07 | Discharge: 2015-09-07 | Disposition: A | Discharge status: Home / Self Care | Payer: Self-pay | Attending: Emergency Medicine | Admitting: Emergency Medicine

## 2015-09-07 DIAGNOSIS — Z720 Tobacco use: Secondary | ICD-10-CM | POA: Insufficient documentation

## 2015-09-07 DIAGNOSIS — H538 Other visual disturbances: Secondary | ICD-10-CM | POA: Insufficient documentation

## 2015-09-07 DIAGNOSIS — R739 Hyperglycemia, unspecified: Secondary | ICD-10-CM

## 2015-09-07 DIAGNOSIS — R55 Syncope and collapse: Secondary | ICD-10-CM | POA: Insufficient documentation

## 2015-09-07 DIAGNOSIS — Z880 Allergy status to penicillin: Secondary | ICD-10-CM | POA: Insufficient documentation

## 2015-09-07 DIAGNOSIS — K08409 Partial loss of teeth, unspecified cause, unspecified class: Secondary | ICD-10-CM | POA: Insufficient documentation

## 2015-09-07 DIAGNOSIS — E1165 Type 2 diabetes mellitus with hyperglycemia: Secondary | ICD-10-CM | POA: Insufficient documentation

## 2015-09-07 LAB — BASIC METABOLIC PANEL WITH GFR
Anion gap: 11 (ref 5–15)
BUN: 8 mg/dL (ref 6–20)
CO2: 23 mmol/L (ref 22–32)
Calcium: 8.9 mg/dL (ref 8.9–10.3)
Chloride: 101 mmol/L (ref 101–111)
Creatinine, Ser: 0.59 mg/dL — ABNORMAL LOW (ref 0.61–1.24)
GFR calc Af Amer: >60 mL/min
GFR calc non Af Amer: >60 mL/min
Glucose, Bld: 383 mg/dL — ABNORMAL HIGH (ref 65–99)
Potassium: 3.8 mmol/L (ref 3.5–5.1)
Sodium: 135 mmol/L (ref 135–145)

## 2015-09-07 LAB — URINALYSIS COMPLETE WITH MICROSCOPIC (ARMC ONLY)
Bacteria, UA: NONE SEEN
Bilirubin Urine: NEGATIVE
Glucose, UA: >500 mg/dL — AB
Hgb urine dipstick: NEGATIVE
Leukocytes, UA: NEGATIVE
Nitrite: NEGATIVE
Protein, ur: NEGATIVE mg/dL
Specific Gravity, Urine: 1.036 — ABNORMAL HIGH (ref 1.005–1.030)
Squamous Epithelial / HPF: NONE SEEN
pH: 6 (ref 5.0–8.0)

## 2015-09-07 LAB — CBC
HCT: 39.5 % — ABNORMAL LOW (ref 40.0–52.0)
Hemoglobin: 13.3 g/dL (ref 13.0–18.0)
MCH: 32 pg (ref 26.0–34.0)
MCHC: 33.6 g/dL (ref 32.0–36.0)
MCV: 95.4 fL (ref 80.0–100.0)
Platelets: 268 K/uL (ref 150–440)
RBC: 4.14 MIL/uL — ABNORMAL LOW (ref 4.40–5.90)
RDW: 12.7 % (ref 11.5–14.5)
WBC: 9.1 K/uL (ref 3.8–10.6)

## 2015-09-07 LAB — GLUCOSE, CAPILLARY: Glucose-Capillary: 367 mg/dL — ABNORMAL HIGH (ref 65–99)

## 2015-09-07 MED ORDER — METFORMIN HCL 500 MG PO TABS: 500.0000 mg | ORAL_TABLET | Freq: Two times a day (BID) | ORAL | Status: DC

## 2015-09-07 NOTE — ED Provider Notes (Signed)
[facility] Emergency Department Provider Note  ____________________________________________  Time seen: 3:30 PM  I have reviewed the triage vital signs and the nursing notes.   HISTORY  Chief Complaint Dizziness    HPI Samuel [name] is a 49 y.o. male who has diabetes but does not take his metformin. Today he was brushing his teeth getting ready for work, brushing the back of his tongue when he suddenly got very dizzy. He also felt like his eyes were blurry so he sat down on the couch. He took a gram of metformin and then came to the ER. He also reports that he's had some nasal congestion for the past few days. No chest pain or shortness of breath. No headaches syncope numbness tingling or weakness. No vomiting or diarrhea. No fevers or chills     Past Medical History  Diagnosis Date  . Diabetes mellitus without complication   . Pancreatitis unk     There are no active problems to display for this patient.    No past surgical history on file.   Current Outpatient Rx  Name  Route  Sig  Dispense  Refill  . ibuprofen (ADVIL,MOTRIN) 200 MG tablet   Oral   Take 800 mg by mouth every 6 (six) hours as needed.         . metFORMIN (GLUCOPHAGE) 500 MG tablet   Oral   Take 1 tablet (500 mg total) by mouth 2 (two) times daily with a meal.   60 tablet   3      Allergies Penicillins   No family history on file.  Social History Social History  Substance Use Topics  . Smoking status: Current Every Day Smoker  . Smokeless tobacco: Not on file  . Alcohol Use: Yes    Review of Systems  Constitutional:   No fever or chills. No weight changes Eyes:   Positive blurry vision briefly which is now resolved ENT:   No sore throat. Cardiovascular:   No chest pain. Respiratory:   No dyspnea or cough. Gastrointestinal:   Negative for abdominal pain, vomiting and diarrhea.  No BRBPR or melena. Genitourinary:   Negative for dysuria, urinary retention,  bloody urine, or difficulty urinating. Musculoskeletal:   Negative for back pain. No joint swelling or pain. Skin:   Negative for rash. Neurological:   Negative for headaches, focal weakness or numbness. Psychiatric:  No anxiety or depression.   Endocrine:  No hot/cold intolerance, changes in energy, or sleep difficulty.  10-point ROS otherwise negative.  ____________________________________________   PHYSICAL EXAM:  VITAL SIGNS: ED Triage Vitals  Enc Vitals Group     BP 09/07/15 1425 133/86 mmHg     Pulse Rate 09/07/15 1425 86     Resp 09/07/15 1425 20     Temp 09/07/15 1425 98.4 F (36.9 C)     Temp Source 09/07/15 1425 Oral     SpO2 09/07/15 1425 98 %     Weight 09/07/15 1425 172 lb (78.019 kg)     Height 09/07/15 1425 '5\' 8"'$  (1.727 m)     Head Cir --      Peak Flow --      Pain Score --      Pain Loc --      Pain Edu? --      Excl. in [place]? --      Constitutional:   Alert and oriented. Well appearing and in no distress. Eyes:   No scleral icterus. No conjunctival pallor.  PERRL. EOMI ENT   Head:   Normocephalic and atraumatic.   Nose:   Nasal congestion. No septal hematoma   Mouth/Throat:   MMM, no pharyngeal erythema. No peritonsillar mass. No uvula shift. Multiple missing teeth.   Neck:   No stridor. No SubQ emphysema. No meningismus. Hematological/Lymphatic/Immunilogical:   No cervical lymphadenopathy. Cardiovascular:   RRR. Normal and symmetric distal pulses are present in all extremities. No murmurs, rubs, or gallops. Respiratory:   Normal respiratory effort without tachypnea nor retractions. Breath sounds are clear and equal bilaterally. No wheezes/rales/rhonchi. Gastrointestinal:   Soft and nontender. No distention. There is no CVA tenderness.  No rebound, rigidity, or guarding. Genitourinary:   deferred Musculoskeletal:   Nontender with normal range of motion in all extremities. No joint effusions.  No lower extremity tenderness.  No  edema. Neurologic:   Normal speech and language.  CN 2-10 normal. Motor grossly intact. No pronator drift.  Normal gait. No gross focal neurologic deficits are appreciated.  Skin:    Skin is warm, dry and intact. No rash noted.  No petechiae, purpura, or bullae. Psychiatric:   Mood and affect are normal. Speech and behavior are normal. Patient exhibits appropriate insight and judgment.  ____________________________________________    LABS (pertinent positives/negatives) (all labs ordered are listed, but only abnormal results are displayed) Labs Reviewed  BASIC METABOLIC PANEL - Abnormal; Notable for the following:    Glucose, Bld 383 (*)    Creatinine, Ser 0.59 (*)    All other components within normal limits  CBC - Abnormal; Notable for the following:    RBC 4.14 (*)    HCT 39.5 (*)    All other components within normal limits  URINALYSIS COMPLETEWITH MICROSCOPIC ([facility] ONLY) - Abnormal; Notable for the following:    Color, Urine STRAW (*)    APPearance CLEAR (*)    Glucose, UA >500 (*)    Ketones, ur TRACE (*)    Specific Gravity, Urine 1.036 (*)    All other components within normal limits  GLUCOSE, CAPILLARY - Abnormal; Notable for the following:    Glucose-Capillary 367 (*)    All other components within normal limits  CBG MONITORING, ED  CBG MONITORING, ED   ____________________________________________   EKG  Interpreted by me  Date: 09/07/2015  Rate: 69  Rhythm: normal sinus rhythm  QRS Axis: normal  Intervals: normal  ST/T Wave abnormalities: normal  Conduction Disutrbances: none  Narrative Interpretation: unremarkable      ____________________________________________    RADIOLOGY    ____________________________________________   PROCEDURES   ____________________________________________   INITIAL IMPRESSION / ASSESSMENT AND PLAN / ED COURSE  Pertinent labs & imaging results that were available during my care of the patient were  reviewed by me and considered in my medical decision making (see chart for details).  Patient presents with hyperglycemia due to metformin noncompliance. He is otherwise well-appearing. With his recent illness and the tooth brushing activity when the dizziness started I think that this is very likely to be due to a vagal stimulation from the back of the tongue with a toothbrush. Additionally the blurry vision appears to be due to his hyperglycemia. He is feeling much better and back to normal now with IV fluids. No evidence of any ketoacidosis, no evidence of stroke or intracranial hemorrhage. CT stable for discharge home. I will refill his metformin as he reports that his metformin at home is very old. I strongly encouraged him to be compliant with the as an inexpensive  means of controlling his chronic medical illness to prevent further decompensation and injury to organs.     ____________________________________________   FINAL CLINICAL IMPRESSION(S) / ED DIAGNOSES  Final diagnoses:  Hyperglycemia   vagal reaction  Carrie Mew, MD 09/07/15 1557

## 2015-09-07 NOTE — Discharge Instructions (Signed)
Hyperglycemia °Hyperglycemia occurs when the glucose (sugar) in your blood is too high. Hyperglycemia can happen for many reasons, but it most often happens to people who do not know they have diabetes or are not managing their diabetes properly.  °CAUSES  °Whether you have diabetes or not, there are other causes of hyperglycemia. Hyperglycemia can occur when you have diabetes, but it can also occur in other situations that you might not be as aware of, such as: °Diabetes °· If you have diabetes and are having problems controlling your blood glucose, hyperglycemia could occur because of some of the following reasons: °¨ Not following your meal plan. °¨ Not taking your diabetes medications or not taking it properly. °¨ Exercising less or doing less activity than you normally do. °¨ Being sick. °Pre-diabetes °· This cannot be ignored. Before people develop Type 2 diabetes, they almost always have "pre-diabetes." This is when your blood glucose levels are higher than normal, but not yet high enough to be diagnosed as diabetes. Research has shown that some long-term damage to the body, especially the heart and circulatory system, may already be occurring during pre-diabetes. If you take action to manage your blood glucose when you have pre-diabetes, you may delay or prevent Type 2 diabetes from developing. °Stress °· If you have diabetes, you may be "diet" controlled or on oral medications or insulin to control your diabetes. However, you may find that your blood glucose is higher than usual in the hospital whether you have diabetes or not. This is often referred to as "stress hyperglycemia." Stress can elevate your blood glucose. This happens because of hormones put out by the body during times of stress. If stress has been the cause of your high blood glucose, it can be followed regularly by your caregiver. That way he/she can make sure your hyperglycemia does not continue to get worse or progress to  diabetes. °Steroids °· Steroids are medications that act on the infection fighting system (immune system) to block inflammation or infection. One side effect can be a rise in blood glucose. Most people can produce enough extra insulin to allow for this rise, but for those who cannot, steroids make blood glucose levels go even higher. It is not unusual for steroid treatments to "uncover" diabetes that is developing. It is not always possible to determine if the hyperglycemia will go away after the steroids are stopped. A special blood test called an A1c is sometimes done to determine if your blood glucose was elevated before the steroids were started. °SYMPTOMS °· Thirsty. °· Frequent urination. °· Dry mouth. °· Blurred vision. °· Tired or fatigue. °· Weakness. °· Sleepy. °· Tingling in feet or leg. °DIAGNOSIS  °Diagnosis is made by monitoring blood glucose in one or all of the following ways: °· A1c test. This is a chemical found in your blood. °· Fingerstick blood glucose monitoring. °· Laboratory results. °TREATMENT  °First, knowing the cause of the hyperglycemia is important before the hyperglycemia can be treated. Treatment may include, but is not be limited to: °· Education. °· Change or adjustment in medications. °· Change or adjustment in meal plan. °· Treatment for an illness, infection, etc. °· More frequent blood glucose monitoring. °· Change in exercise plan. °· Decreasing or stopping steroids. °· Lifestyle changes. °HOME CARE INSTRUCTIONS  °· Test your blood glucose as directed. °· Exercise regularly. Your caregiver will give you instructions about exercise. Pre-diabetes or diabetes which comes on with stress is helped by exercising. °· Eat wholesome,   balanced meals. Eat often and at regular, fixed times. Your caregiver or nutritionist will give you a meal plan to guide your sugar intake. °· Being at an ideal weight is important. If needed, losing as little as 10 to 15 pounds may help improve blood  glucose levels. °SEEK MEDICAL CARE IF:  °· You have questions about medicine, activity, or diet. °· You continue to have symptoms (problems such as increased thirst, urination, or weight gain). °SEEK IMMEDIATE MEDICAL CARE IF:  °· You are vomiting or have diarrhea. °· Your breath smells fruity. °· You are breathing faster or slower. °· You are very sleepy or incoherent. °· You have numbness, tingling, or pain in your feet or hands. °· You have chest pain. °· Your symptoms get worse even though you have been following your caregiver's orders. °· If you have any other questions or concerns. °  °This information is not intended to replace advice given to you by your health care provider. Make sure you discuss any questions you have with your health care provider. °  °Document Released: 05/03/2001 Document Revised: 01/30/2012 Document Reviewed: 07/14/2015 °Elsevier Interactive Patient Education ©2016 [employer] ° °

## 2015-09-07 NOTE — ED Notes (Signed)
Pt reports was brushing his teeth and all of a sudden he started feeling dizzy and he couldn't get his eyes to adjust right. Pt also reports feet felt heavy. Denies difficulty speaking. Pt reports has trouble with his sugar levels so he took a metformin '100mg'$  when the sx's came on. Pt reports has no way to check his sugar at home.

## 2015-09-07 NOTE — ED Notes (Signed)
Pt says had dizzy spell this am.  Says he has beenoff his metformin due to no doctor and no insurance.  But he just started a job and will have insurance soon.  [facility] expllained and patient agrees to follo wup .  He as ivf infusing now annd in in nad.  Denies pain.

## 2015-10-26 ENCOUNTER — Emergency Department
Admission: EM | Admit: 2015-10-26 | Discharge: 2015-10-26 | Disposition: A | Discharge status: Home / Self Care | Payer: Self-pay | Attending: Emergency Medicine | Admitting: Emergency Medicine

## 2015-10-26 DIAGNOSIS — E119 Type 2 diabetes mellitus without complications: Secondary | ICD-10-CM | POA: Insufficient documentation

## 2015-10-26 DIAGNOSIS — K029 Dental caries, unspecified: Secondary | ICD-10-CM | POA: Insufficient documentation

## 2015-10-26 DIAGNOSIS — H53149 Visual discomfort, unspecified: Secondary | ICD-10-CM | POA: Insufficient documentation

## 2015-10-26 DIAGNOSIS — Z7984 Long term (current) use of oral hypoglycemic drugs: Secondary | ICD-10-CM | POA: Insufficient documentation

## 2015-10-26 DIAGNOSIS — F17200 Nicotine dependence, unspecified, uncomplicated: Secondary | ICD-10-CM | POA: Insufficient documentation

## 2015-10-26 DIAGNOSIS — Z880 Allergy status to penicillin: Secondary | ICD-10-CM | POA: Insufficient documentation

## 2015-10-26 MED ORDER — ERYTHROMYCIN BASE 500 MG PO TABS: 500.0000 mg | ORAL_TABLET | Freq: Four times a day (QID) | ORAL | Status: AC

## 2015-10-26 MED ORDER — IBUPROFEN 800 MG PO TABS: 800.0000 mg | ORAL_TABLET | Freq: Three times a day (TID) | ORAL | Status: DC | PRN

## 2015-10-26 MED ORDER — TRAMADOL HCL 50 MG PO TABS: 50.0000 mg | ORAL_TABLET | Freq: Four times a day (QID) | ORAL | Status: AC | PRN

## 2015-10-26 NOTE — ED Notes (Signed)
Pt in w/ complaints of dental pain and ear pain on left side. Pt reports waking up this morning w/ difficulty seeing out of left eye; pt states he has a tooth that needs to be removed but unable to get to dentist due to financial reasons.

## 2015-10-26 NOTE — ED Notes (Signed)
Pt c/o left toothache for the past week.Marland KitchenMarland Kitchen

## 2015-10-26 NOTE — ED Provider Notes (Signed)
[facility] Emergency Department Provider Note  ____________________________________________  Time seen: Approximately 4:51 PM  I have reviewed the triage vital signs and the nursing notes.   HISTORY  Chief Complaint Dental Pain    HPI Sheila Gervasi is a 49 y.o. male patient complaining of dental pain which is chronic in nature but has increased in the last 3 days. Patient also state he has difficulty seeing out of the left eye. Patient said  his dental issues is due due to lack of insurance. Patient state his dental pain is rated as a 7/10. Describes a sharp. Patient states been overdosing on ibuprofen which has not helped. Patient also noticed increased edema to the gum area on the left side today. No other palliative measures taken for this complaint.  Past Medical History  Diagnosis Date  . Diabetes mellitus without complication ([place])   . Pancreatitis unk    There are no active problems to display for this patient.   History reviewed. No pertinent past surgical history.  Current Outpatient Rx  Name  Route  Sig  Dispense  Refill  . erythromycin base (E-MYCIN) 500 MG tablet   Oral   Take 1 tablet (500 mg total) by mouth 4 (four) times daily.   40 tablet   0   . ibuprofen (ADVIL,MOTRIN) 200 MG tablet   Oral   Take 800 mg by mouth every 6 (six) hours as needed.         [name] ibuprofen (ADVIL,MOTRIN) 800 MG tablet   Oral   Take 1 tablet (800 mg total) by mouth every 8 (eight) hours as needed.   30 tablet   0   . metFORMIN (GLUCOPHAGE) 500 MG tablet   Oral   Take 1 tablet (500 mg total) by mouth 2 (two) times daily with a meal.   60 tablet   3   . traMADol (ULTRAM) 50 MG tablet   Oral   Take 1 tablet (50 mg total) by mouth every 6 (six) hours as needed.   20 tablet   0     Allergies Penicillins  No family history on file.  Social History Social History  Substance Use Topics  . Smoking status: Current Every Day Smoker  . Smokeless  tobacco: None  . Alcohol Use: Yes    Review of Systems Constitutional: No fever/chills Eyes: Decreased vision left eye ENT: No sore throat. Dental pain  Cardiovascular: Denies chest pain. Respiratory: Denies shortness of breath. Gastrointestinal: No abdominal pain.  No nausea, no vomiting.  No diarrhea.  No constipation. Genitourinary: Negative for dysuria. Musculoskeletal: Negative for back pain. Skin: Negative for rash. Neurological: Negative for headaches, focal weakness or numbness. Endocrine:Diabetes 10-point ROS otherwise negative.  ____________________________________________   PHYSICAL EXAM:  VITAL SIGNS: ED Triage Vitals  Enc Vitals Group     BP 10/26/15 1627 139/90 mmHg     Pulse Rate 10/26/15 1627 108     Resp 10/26/15 1627 18     Temp 10/26/15 1627 98.2 F (36.8 C)     Temp Source 10/26/15 1627 Oral     SpO2 10/26/15 1627 94 %     Weight 10/26/15 1627 170 lb (77.111 kg)     Height 10/26/15 1627 '5\' 8"'$  (1.727 m)     Head Cir --      Peak Flow --      Pain Score 10/26/15 1630 7     Pain Loc --      Pain Edu? --  Excl. in [place]? --     Constitutional: Alert and oriented. Well appearing and in no acute distress. Eyes: Conjunctivae are normal. PERRL. EOMI. see vision acuity results Head: Atraumatic. Nose: No congestion/rhinnorhea. Mouth/Throat: Mucous membranes are moist.  Oropharynx non-erythematous. Edematous gingiva multiple dental caries and devitalized teeth . No obvious abscess.  Neck: No stridor.  No cervical spine tenderness to palpation. Hematological/Lymphatic/Immunilogical: No cervical lymphadenopathy. Cardiovascular: Normal rate, regular rhythm. Grossly normal heart sounds.  Good peripheral circulation. Respiratory: Normal respiratory effort.  No retractions. Lungs CTAB. Gastrointestinal: Soft and nontender. No distention. No abdominal bruits. No CVA tenderness. Musculoskeletal: No lower extremity tenderness nor edema.  No joint  effusions. Neurologic:  Normal speech and language. No gross focal neurologic deficits are appreciated. No gait instability. Skin:  Skin is warm, dry and intact. No rash noted. Psychiatric: Mood and affect are normal. Speech and behavior are normal.  ____________________________________________   LABS (all labs ordered are listed, but only abnormal results are displayed)  Labs Reviewed - No data to display ____________________________________________  EKG   ____________________________________________  RADIOLOGY   ____________________________________________   PROCEDURES  Procedure(s) performed: None  Critical Care performed: No  ____________________________________________   INITIAL IMPRESSION / ASSESSMENT AND PLAN / ED COURSE  Pertinent labs & imaging results that were available during my care of the patient were reviewed by me and considered in my medical decision making (see chart for details).  Dental pain. Patient given a list of dental clinics but advised [facility] with [facility] in the morning since they have walk-in appointments. Patient given a prescription for erythromycin, ibuprofen, and tramadol. ____________________________________________   FINAL CLINICAL IMPRESSION(S) / ED DIAGNOSES  Final diagnoses:  Pain due to dental caries      Sable Feil, PA-C 10/26/15 [place], MD 10/27/15 [phone]

## 2015-10-26 NOTE — Discharge Instructions (Signed)
Follow-up from the list of dental clinics provided. OPTIONS FOR DENTAL FOLLOW UP CARE  [place] [facility] and [place] [name] [phone])  [facility] [phone])  [place] [phone])  [place] [phone])  [facility] [phone]) This clinic caters to the indigent population and is on a lottery system. Location: [employer] [facility], [facility], [place], [place] Clinic Hours: Wednesdays from 6pm - 9pm, patients seen by a lottery system. For dates, call or go to [email or web address] Services: Cleanings, fillings and simple extractions. Payment Options: DENTAL WORK IS FREE OF CHARGE. Bring proof of income or support. Best way to get seen: Arrive at 5:15 pm - this is a lottery, NOT first come/first serve, so arriving earlier will not increase your chances of being seen.     [facility] [phone] Select option 1 for emergencies   Location: [facility], [place], [address] [place] Clinic Hours: No walk-ins accepted - call the day before to schedule an appointment. Check in times are 9:30 am and 1:30 pm. Services: Simple extractions, temporary fillings, pulpectomy/pulp debridement, uncomplicated abscess drainage. Payment Options: PAYMENT IS DUE AT THE TIME OF SERVICE.  Fee is usually $100-200, additional surgical procedures (e.g. abscess drainage) may be extra. Cash, checks, Visa/MasterCard accepted.  Can file Medicaid if patient is covered for dental - patient should call case worker to check. No discount for [facility] patients. Best way to get seen: MUST call the day before and get onto the schedule. Can usually be seen the next 1-2 days. No walk-ins accepted.      [place] [phone]   Location: [place], [place] Clinic Hours: M, W, Th, F 8am or 1:30pm, Tues 9a or 1:30 - first come/first served. Services: Simple extractions, temporary fillings, uncomplicated abscess drainage.  You do not need to be an [facility] resident. Payment Options: PAYMENT IS DUE AT THE TIME OF SERVICE. Dental insurance, otherwise sliding scale - bring proof of income or support. Depending on income and treatment needed, cost is usually $50-200. Best way to get seen: Arrive early as it is first come/first served.     [facility] [phone]   Location: [place] [facility] Hours: Mon-Thu 8a-5p Services: Most basic dental services including extractions and fillings. Payment Options: PAYMENT IS DUE AT THE TIME OF SERVICE. Sliding scale, up to 50% off - bring proof if income or support. Medicaid with dental option accepted. Best way to get seen: Call to schedule an appointment, can usually be seen within 2 weeks OR they will try to see walk-ins - show up at [place] or 2p (you may have to wait).     [facility] [place] RESIDENTS ONLY   Location: [facility], [place] [address] [place],  [address] Clinic Hours: By appointment only. Monday - Thursday 8am-5pm, Friday 8am-12pm Services: Cleanings, fillings, extractions. Payment Options: PAYMENT IS DUE AT THE TIME OF SERVICE. Cash, Visa or MasterCard. Sliding scale - $30 minimum per service. Best way to get seen: Come in to office, complete packet and make an appointment - need proof of income or support monies for each household member and proof of [facility] residence. Usually takes about a month to get in.     [facility] [phone]   Location: [address], [facility] Hours: Walk-in Urgent Care  Dental Services are offered Monday-Friday mornings only. The numbers of emergencies accepted daily is limited to the number of providers available. Maximum 15 - Mondays, Wednesdays & Thursdays Maximum 10 - Tuesdays & Fridays Services: You do not need to be a [facility] resident to be seen for a dental emergency. Emergencies are defined as pain, swelling, abnormal bleeding, or dental trauma. Walkins will receive x-rays if needed. NOTE: Dental cleaning is not an emergency. Payment Options: PAYMENT IS DUE AT THE TIME OF SERVICE. Minimum co-pay is $40.00 for uninsured patients. Minimum co-pay is $3.00 for Medicaid with dental coverage. Dental Insurance is accepted and must be presented at time of visit. Medicare does not cover dental. Forms of payment: Cash, credit card, checks. Best way to get seen: If not previously registered with the clinic, walk-in dental registration begins at 7:15 am and is on a first come/first serve basis. If previously registered with the clinic, call to make an appointment.     [facility] [place] ONLY   Location: [address], [place], [place] Clinic Hours: Mon-Thu 10a-2p Services: Extractions only! Payment Options: FREE (donations accepted) - bring proof of income or support Best way to get seen: Call and schedule an appointment OR come at 8am on the 1st Monday of every month (except for holidays) when it is first come/first served.     [facility] [phone]   Location: [place], [place] Clinic Hours: Friday mornings Services, Payment Options, Best way to get seen: Call for info

## 2015-10-27 ENCOUNTER — Telehealth: Payer: Self-pay | Admitting: Emergency Medicine

## 2015-10-27 NOTE — ED Notes (Signed)
[facility] called asking for cheaper med as erythromycin as ordered would be about $800.  Per  Dr Jimmye Norman can change to clindamycin 300 mg 4 times a day for 10 days.  Can break this down into 2 -'150mg'$  tabs instead of 300 mg tab if that makes the medication cheaper.

## 2016-11-13 ENCOUNTER — Emergency Department
Admission: EM | Admit: 2016-11-13 | Discharge: 2016-11-13 | Payer: Self-pay | Attending: Emergency Medicine | Admitting: Emergency Medicine

## 2016-11-13 ENCOUNTER — Emergency Department: Payer: Self-pay

## 2016-11-13 ENCOUNTER — Encounter: Payer: Self-pay | Admitting: Emergency Medicine

## 2016-11-13 DIAGNOSIS — E119 Type 2 diabetes mellitus without complications: Secondary | ICD-10-CM | POA: Insufficient documentation

## 2016-11-13 DIAGNOSIS — F10929 Alcohol use, unspecified with intoxication, unspecified: Secondary | ICD-10-CM

## 2016-11-13 DIAGNOSIS — Z791 Long term (current) use of non-steroidal anti-inflammatories (NSAID): Secondary | ICD-10-CM | POA: Insufficient documentation

## 2016-11-13 DIAGNOSIS — F17200 Nicotine dependence, unspecified, uncomplicated: Secondary | ICD-10-CM | POA: Insufficient documentation

## 2016-11-13 DIAGNOSIS — Y9389 Activity, other specified: Secondary | ICD-10-CM | POA: Insufficient documentation

## 2016-11-13 DIAGNOSIS — F10129 Alcohol abuse with intoxication, unspecified: Secondary | ICD-10-CM | POA: Insufficient documentation

## 2016-11-13 DIAGNOSIS — Y999 Unspecified external cause status: Secondary | ICD-10-CM | POA: Insufficient documentation

## 2016-11-13 DIAGNOSIS — Y92410 Unspecified street and highway as the place of occurrence of the external cause: Secondary | ICD-10-CM | POA: Insufficient documentation

## 2016-11-13 DIAGNOSIS — S2241XA Multiple fractures of ribs, right side, initial encounter for closed fracture: Secondary | ICD-10-CM | POA: Insufficient documentation

## 2016-11-13 DIAGNOSIS — R319 Hematuria, unspecified: Secondary | ICD-10-CM | POA: Insufficient documentation

## 2016-11-13 DIAGNOSIS — Z7984 Long term (current) use of oral hypoglycemic drugs: Secondary | ICD-10-CM | POA: Insufficient documentation

## 2016-11-13 DIAGNOSIS — S80211A Abrasion, right knee, initial encounter: Secondary | ICD-10-CM | POA: Insufficient documentation

## 2016-11-13 DIAGNOSIS — S066X9A Traumatic subarachnoid hemorrhage with loss of consciousness of unspecified duration, initial encounter: Secondary | ICD-10-CM | POA: Insufficient documentation

## 2016-11-13 DIAGNOSIS — S0083XA Contusion of other part of head, initial encounter: Secondary | ICD-10-CM

## 2016-11-13 DIAGNOSIS — S129XXA Fracture of neck, unspecified, initial encounter: Secondary | ICD-10-CM | POA: Insufficient documentation

## 2016-11-13 LAB — CBC WITH DIFFERENTIAL/PLATELET
BASOS PCT: 0 %
Basophils Absolute: 0.1 10*3/uL (ref 0–0.1)
EOS ABS: 0.1 10*3/uL (ref 0–0.7)
EOS PCT: 1 %
HCT: 44.6 % (ref 40.0–52.0)
Hemoglobin: 14.8 g/dL (ref 13.0–18.0)
Lymphocytes Relative: 19 %
Lymphs Abs: 3.6 10*3/uL (ref 1.0–3.6)
MCH: 32.1 pg (ref 26.0–34.0)
MCHC: 33.2 g/dL (ref 32.0–36.0)
MCV: 96.8 fL (ref 80.0–100.0)
Monocytes Absolute: 1 10*3/uL (ref 0.2–1.0)
Monocytes Relative: 5 %
Neutro Abs: 14.4 10*3/uL — ABNORMAL HIGH (ref 1.4–6.5)
Neutrophils Relative %: 75 %
PLATELETS: 166 10*3/uL (ref 150–440)
RBC: 4.61 MIL/uL (ref 4.40–5.90)
RDW: 13 % (ref 11.5–14.5)
WBC: 19.2 10*3/uL — AB (ref 3.8–10.6)

## 2016-11-13 LAB — URINALYSIS, ROUTINE W REFLEX MICROSCOPIC
BACTERIA UA: NONE SEEN
BILIRUBIN URINE: NEGATIVE
Glucose, UA: >=500 mg/dL — AB
Ketones, ur: NEGATIVE mg/dL
Leukocytes, UA: NEGATIVE
NITRITE: NEGATIVE
PROTEIN: 100 mg/dL — AB
Specific Gravity, Urine: 1.012 (ref 1.005–1.030)
Squamous Epithelial / LPF: NONE SEEN
pH: 6 (ref 5.0–8.0)

## 2016-11-13 LAB — COMPREHENSIVE METABOLIC PANEL
ALBUMIN: 4.2 g/dL (ref 3.5–5.0)
ALT: 280 U/L — AB (ref 17–63)
AST: 624 U/L — AB (ref 15–41)
Alkaline Phosphatase: 63 U/L (ref 38–126)
Anion gap: 14 (ref 5–15)
BUN: 11 mg/dL (ref 6–20)
CHLORIDE: 100 mmol/L — AB (ref 101–111)
CO2: 21 mmol/L — AB (ref 22–32)
CREATININE: 0.79 mg/dL (ref 0.61–1.24)
Calcium: 9 mg/dL (ref 8.9–10.3)
GFR calc non Af Amer: >60 mL/min (ref >60)
Glucose, Bld: 446 mg/dL — ABNORMAL HIGH (ref 65–99)
Potassium: 3.5 mmol/L (ref 3.5–5.1)
Sodium: 135 mmol/L (ref 135–145)
Total Bilirubin: 0.3 mg/dL (ref 0.3–1.2)
Total Protein: 7.6 g/dL (ref 6.5–8.1)

## 2016-11-13 LAB — LIPASE, BLOOD: Lipase: 16 U/L (ref 11–51)

## 2016-11-13 LAB — TROPONIN I

## 2016-11-13 LAB — PROTIME-INR
INR: 0.99
Prothrombin Time: 13.1 seconds (ref 11.4–15.2)

## 2016-11-13 LAB — TYPE AND SCREEN
ABO/RH(D): A POS
Antibody Screen: NEGATIVE

## 2016-11-13 LAB — ETHANOL: Alcohol, Ethyl (B): 317 mg/dL (ref <5)

## 2016-11-13 MED ORDER — IOPAMIDOL (ISOVUE-300) INJECTION 61%
100.0000 mL | Freq: Once | INTRAVENOUS | Status: AC | PRN
  Administered 2016-11-13: 100 mL via INTRAVENOUS

## 2016-11-13 MED ORDER — ONDANSETRON HCL 4 MG/2ML IJ SOLN: 4.0000 mg | INTRAMUSCULAR | Status: DC

## 2016-11-13 MED ORDER — SODIUM CHLORIDE 0.9 % IV BOLUS (SEPSIS)
1000.0000 mL | INTRAVENOUS | Status: AC
  Administered 2016-11-13: 1000 mL via INTRAVENOUS

## 2016-11-13 MED ORDER — FENTANYL CITRATE (PF) 100 MCG/2ML IJ SOLN
100.0000 ug | INTRAMUSCULAR | Status: DC | PRN
  Administered 2016-11-13: 100 ug via INTRAVENOUS
  Filled 2016-11-13: qty 2

## 2016-11-13 MED ORDER — TETANUS-DIPHTH-ACELL PERTUSSIS 5-2.5-18.5 LF-MCG/0.5 IM SUSP
0.5000 mL | Freq: Once | INTRAMUSCULAR | Status: AC
  Administered 2016-11-13: 0.5 mL via INTRAMUSCULAR
  Filled 2016-11-13: qty 0.5

## 2016-11-13 NOTE — ED Notes (Signed)
Pt heard calling out for help; in to check on pt who voices need to void; in using urinal, pt accidentally spilled container; assist x 3 with proper c-spine alignment, to clean pt and place clean sheets on bed; side rails up x 2 for safety
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
## 2016-11-13 NOTE — ED Notes (Signed)
Lab called to report critical ethanal of 317, doctor notified.
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
## 2016-11-13 NOTE — ED Notes (Signed)
Irrigated skin tears of rt. Knee.

## 2016-11-14 LAB — URINE CULTURE: SPECIAL REQUESTS: NORMAL

## 2016-12-22 ENCOUNTER — Encounter: Payer: Self-pay | Admitting: *Deleted

## 2016-12-22 ENCOUNTER — Emergency Department
Admission: EM | Admit: 2016-12-22 | Discharge: 2016-12-23 | Disposition: A | Discharge status: Other Acute Inpatient Hospital | Payer: Self-pay | Attending: Student in an Organized Health Care Education/Training Program | Admitting: Student in an Organized Health Care Education/Training Program

## 2016-12-22 ENCOUNTER — Emergency Department: Payer: Self-pay

## 2016-12-22 DIAGNOSIS — Z7984 Long term (current) use of oral hypoglycemic drugs: Secondary | ICD-10-CM | POA: Insufficient documentation

## 2016-12-22 DIAGNOSIS — R45851 Suicidal ideations: Secondary | ICD-10-CM

## 2016-12-22 DIAGNOSIS — M25511 Pain in right shoulder: Secondary | ICD-10-CM | POA: Insufficient documentation

## 2016-12-22 DIAGNOSIS — E119 Type 2 diabetes mellitus without complications: Secondary | ICD-10-CM | POA: Insufficient documentation

## 2016-12-22 DIAGNOSIS — F17210 Nicotine dependence, cigarettes, uncomplicated: Secondary | ICD-10-CM | POA: Insufficient documentation

## 2016-12-22 DIAGNOSIS — G8929 Other chronic pain: Secondary | ICD-10-CM | POA: Insufficient documentation

## 2016-12-22 DIAGNOSIS — Z79899 Other long term (current) drug therapy: Secondary | ICD-10-CM | POA: Insufficient documentation

## 2016-12-22 DIAGNOSIS — Z791 Long term (current) use of non-steroidal anti-inflammatories (NSAID): Secondary | ICD-10-CM | POA: Insufficient documentation

## 2016-12-22 DIAGNOSIS — F1010 Alcohol abuse, uncomplicated: Secondary | ICD-10-CM | POA: Insufficient documentation

## 2016-12-22 LAB — URINE DRUG SCREEN, QUALITATIVE (ARMC ONLY)
Amphetamines, Ur Screen: NOT DETECTED
BARBITURATES, UR SCREEN: NOT DETECTED
Benzodiazepine, Ur Scrn: NOT DETECTED
CANNABINOID 50 NG, UR ~~LOC~~: NOT DETECTED
COCAINE METABOLITE, UR ~~LOC~~: NOT DETECTED
MDMA (Ecstasy)Ur Screen: NOT DETECTED
Methadone Scn, Ur: NOT DETECTED
Opiate, Ur Screen: NOT DETECTED
Phencyclidine (PCP) Ur S: NOT DETECTED
TRICYCLIC, UR SCREEN: NOT DETECTED

## 2016-12-22 LAB — CBC
HCT: 41.9 % (ref 40.0–52.0)
HEMOGLOBIN: 14.4 g/dL (ref 13.0–18.0)
MCH: 32.9 pg (ref 26.0–34.0)
MCHC: 34.4 g/dL (ref 32.0–36.0)
MCV: 95.8 fL (ref 80.0–100.0)
PLATELETS: 249 10*3/uL (ref 150–440)
RBC: 4.37 MIL/uL — ABNORMAL LOW (ref 4.40–5.90)
RDW: 13 % (ref 11.5–14.5)
WBC: 10.3 10*3/uL (ref 3.8–10.6)

## 2016-12-22 LAB — COMPREHENSIVE METABOLIC PANEL
ALK PHOS: 98 U/L (ref 38–126)
ALT: 99 U/L — AB (ref 17–63)
AST: 91 U/L — ABNORMAL HIGH (ref 15–41)
Albumin: 3.8 g/dL (ref 3.5–5.0)
Anion gap: 11 (ref 5–15)
BILIRUBIN TOTAL: 0.4 mg/dL (ref 0.3–1.2)
BUN: 9 mg/dL (ref 6–20)
CALCIUM: 9 mg/dL (ref 8.9–10.3)
CO2: 22 mmol/L (ref 22–32)
CREATININE: 0.59 mg/dL — AB (ref 0.61–1.24)
Chloride: 99 mmol/L — ABNORMAL LOW (ref 101–111)
Glucose, Bld: 334 mg/dL — ABNORMAL HIGH (ref 65–99)
Potassium: 3.8 mmol/L (ref 3.5–5.1)
Sodium: 132 mmol/L — ABNORMAL LOW (ref 135–145)
TOTAL PROTEIN: 7.4 g/dL (ref 6.5–8.1)

## 2016-12-22 LAB — ETHANOL: ALCOHOL ETHYL (B): 137 mg/dL — AB (ref <5)

## 2016-12-22 LAB — SALICYLATE LEVEL

## 2016-12-22 LAB — ACETAMINOPHEN LEVEL: Acetaminophen (Tylenol), Serum: <10 ug/mL — ABNORMAL LOW (ref 10–30)

## 2016-12-22 NOTE — ED Notes (Signed)
Report from Stephen, RN.

## 2016-12-22 NOTE — ED Notes (Signed)
Pt finished with telepsych
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
## 2016-12-22 NOTE — ED Notes (Signed)
Roslyn set up at bedside.

## 2016-12-22 NOTE — ED Notes (Signed)
Pt reports he was recently kicked out of house and is homeless at this time. This has increased the thoughts of suicide as well.

## 2016-12-22 NOTE — ED Notes (Signed)
X-ray at bedside
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
## 2016-12-23 ENCOUNTER — Inpatient Hospital Stay
Admission: RE | Admit: 2016-12-23 | Discharge: 2016-12-28 | DRG: 881 | Disposition: A | Discharge status: Home / Self Care | Payer: No Typology Code available for payment source | Source: Intra-hospital | Attending: Psychiatry | Admitting: Psychiatry

## 2016-12-23 DIAGNOSIS — F17200 Nicotine dependence, unspecified, uncomplicated: Secondary | ICD-10-CM

## 2016-12-23 DIAGNOSIS — Y906 Blood alcohol level of 120-199 mg/100 ml: Secondary | ICD-10-CM | POA: Diagnosis present

## 2016-12-23 DIAGNOSIS — F329 Major depressive disorder, single episode, unspecified: Principal | ICD-10-CM

## 2016-12-23 DIAGNOSIS — F17210 Nicotine dependence, cigarettes, uncomplicated: Secondary | ICD-10-CM | POA: Diagnosis present

## 2016-12-23 DIAGNOSIS — E1165 Type 2 diabetes mellitus with hyperglycemia: Secondary | ICD-10-CM | POA: Diagnosis present

## 2016-12-23 DIAGNOSIS — F1994 Other psychoactive substance use, unspecified with psychoactive substance-induced mood disorder: Secondary | ICD-10-CM

## 2016-12-23 DIAGNOSIS — R45851 Suicidal ideations: Secondary | ICD-10-CM | POA: Diagnosis present

## 2016-12-23 DIAGNOSIS — F10239 Alcohol dependence with withdrawal, unspecified: Secondary | ICD-10-CM | POA: Diagnosis present

## 2016-12-23 DIAGNOSIS — E119 Type 2 diabetes mellitus without complications: Secondary | ICD-10-CM

## 2016-12-23 DIAGNOSIS — Z590 Homelessness: Secondary | ICD-10-CM | POA: Diagnosis not present

## 2016-12-23 DIAGNOSIS — F1020 Alcohol dependence, uncomplicated: Secondary | ICD-10-CM

## 2016-12-23 DIAGNOSIS — G4700 Insomnia, unspecified: Secondary | ICD-10-CM | POA: Diagnosis present

## 2016-12-23 DIAGNOSIS — F10939 Alcohol use, unspecified with withdrawal, unspecified: Secondary | ICD-10-CM

## 2016-12-23 LAB — GLUCOSE, CAPILLARY: GLUCOSE-CAPILLARY: 244 mg/dL — AB (ref 65–99)

## 2016-12-23 MED ORDER — ACETAMINOPHEN 325 MG PO TABS
ORAL_TABLET | ORAL | Status: AC
Start: 2016-12-23 — End: 2016-12-23
  Administered 2016-12-23: 650 mg via ORAL
  Filled 2016-12-23: qty 2

## 2016-12-23 MED ORDER — ACETAMINOPHEN 325 MG PO TABS
650.0000 mg | ORAL_TABLET | Freq: Once | ORAL | Status: AC
  Administered 2016-12-23: 650 mg via ORAL

## 2016-12-23 MED ORDER — LORAZEPAM 2 MG PO TABS
0.0000 mg | ORAL_TABLET | Freq: Four times a day (QID) | ORAL | Status: DC
  Administered 2016-12-23: 1 mg via ORAL
  Administered 2016-12-23: 2 mg via ORAL
  Filled 2016-12-23: qty 1

## 2016-12-23 MED ORDER — CITALOPRAM HYDROBROMIDE 20 MG PO TABS: 20.0000 mg | ORAL_TABLET | Freq: Every day | ORAL | Status: DC

## 2016-12-23 MED ORDER — OXYCODONE-ACETAMINOPHEN 5-325 MG PO TABS
1.0000 | ORAL_TABLET | Freq: Once | ORAL | Status: AC
  Administered 2016-12-23: 1 via ORAL
  Filled 2016-12-23: qty 1

## 2016-12-23 MED ORDER — LORAZEPAM 2 MG PO TABS: 0.0000 mg | ORAL_TABLET | Freq: Two times a day (BID) | ORAL | Status: DC

## 2016-12-23 MED ORDER — IBUPROFEN 800 MG PO TABS
800.0000 mg | ORAL_TABLET | Freq: Once | ORAL | Status: AC
  Administered 2016-12-23: 800 mg via ORAL
  Filled 2016-12-23: qty 1

## 2016-12-23 MED ORDER — METFORMIN HCL 500 MG PO TABS
500.0000 mg | ORAL_TABLET | Freq: Two times a day (BID) | ORAL | Status: DC
  Administered 2016-12-23 (×2): 500 mg via ORAL
  Filled 2016-12-23 (×2): qty 1

## 2016-12-23 MED ORDER — LORAZEPAM 2 MG PO TABS
ORAL_TABLET | ORAL | Status: AC
  Administered 2016-12-23: 2 mg via ORAL
  Filled 2016-12-23: qty 1

## 2016-12-23 NOTE — Plan of Care (Signed)
Problem: Coping: Goal: Ability to verbalize feelings will improve Outcome: Progressing Able to discuss his feelings and concerns

## 2016-12-23 NOTE — Progress Notes (Signed)
Inpatient Diabetes Program Recommendations  AACE/ADA: New Consensus Statement on Inpatient Glycemic Control (2015)  Target Ranges:  Prepandial:   less than 140 mg/dL      Peak postprandial:   less than 180 mg/dL (1-2 hours)      Critically ill patients:  140 - 180 mg/dL   Lab Results  Component Value Date   GLUCAP 244 (H) 12/23/2016    Review of Glycemic Control:  Results for BENNEY, [place] (MRN [id number]) as of 12/23/2016 12:16  Ref. Range 12/23/2016 00:41  Glucose-Capillary Latest Ref Range: 65 - 99 mg/dL 244 (H)   Diabetes history: Type 2 diabetes Outpatient Diabetes medications: Metformin 500 mg bid Current orders for Inpatient glycemic control:  Metformin 500 mg bid  Inpatient Diabetes Program Recommendations:    Note history of ETOH use up to 18-24 beers daily.  May consider d/c of metformin due to history of alcohol use. Consider adding Novolog sensitive correction tid with meals and HS.    Thanks, Adah Perl, RN, BC-ADM Inpatient Diabetes Coordinator Pager [phone] (8a-5p)

## 2016-12-23 NOTE — ED Notes (Signed)
Patient resting quietly in room. No noted distress or abnormal behaviors noted. Will continue 15 minute checks and observation by security camera for safety. 

## 2016-12-23 NOTE — ED Notes (Signed)
ED BHU [place] Is the patient under IVC or is there intent for IVC: No. Is the patient medically cleared: Yes.   Is there vacancy in the ED BHU: Yes.   Is the population mix appropriate for patient: Yes.   Is the patient awaiting placement in inpatient or outpatient setting: No. Has the patient had a psychiatric consult: Yes.   Survey of unit performed for contraband, proper placement and condition of furniture, tampering with fixtures in bathroom, shower, and each patient room: Yes.  ; Findings: NA APPEARANCE/BEHAVIOR calm, cooperative and adequate rapport can be established NEURO ASSESSMENT Orientation: time, place and person Hallucinations: No.None noted (Hallucinations) Speech: Normal Gait: normal RESPIRATORY ASSESSMENT Normal expansion.  Clear to auscultation.  No rales, rhonchi, or wheezing. CARDIOVASCULAR ASSESSMENT regular rate and rhythm, S1, S2 normal, no murmur, click, rub or gallop GASTROINTESTINAL ASSESSMENT soft, nontender, BS WNL, no r/g EXTREMITIES normal strength, tone, and muscle mass PLAN OF CARE Provide calm/safe environment. Vital signs assessed twice daily. ED BHU Assessment once each 12-hour shift. Collaborate with intake RN daily or as condition indicates. Assure the ED provider has rounded once each shift. Provide and encourage hygiene. Provide redirection as needed. Assess for escalating behavior; address immediately and inform ED provider.  Assess family dynamic and appropriateness for visitation as needed: Yes.  ; If necessary, describe findings: NA Educate the patient/family about BHU procedures/visitation: Yes.  ; If necessary, describe findings: NA

## 2016-12-23 NOTE — ED Provider Notes (Signed)
-----------------------------------------   7:08 AM on 12/23/2016 -----------------------------------------   Blood pressure (!) 138/107, pulse 95, temperature 98.1 F (36.7 C), temperature source Oral, resp. rate 18, height '5\' 8"'$  (1.727 m), weight 160 lb (72.6 kg), SpO2 96 %.  The patient had no acute events since last update.  Calm and cooperative at this time.  Disposition is pending Psychiatry/Behavioral Medicine team recommendations.     Paulette Blanch, MD 12/23/16 [phone]

## 2016-12-23 NOTE — Progress Notes (Signed)
Per Dr. Weber Cooks request TTS has faxed pt referral to [facility] for review.

## 2016-12-23 NOTE — ED Notes (Signed)
Patient made aware of transfer to BMU unit. Patient in agreement with transfer. Patient states, "I really need to work on myself and this will be good."

## 2016-12-23 NOTE — Progress Notes (Signed)
Patient ID: Samuel [name], male   DOB: 1965/12/31, 51 y.o.   MRN: [id number] Patient presents voluntarily, complaining of increased depression, hopelessness, helplessness and suicidal thoughts. States that sometimes he wants to drink too much and "die in sleep". Patient reports multiple stressors. Reports that he has recently lost his job. His girlfriend left him and patient became homeless. Patient reports that he started drinking heavily after he lost his job. Has been drinking a 24 pack of beer on daily basis. Has been experiencing fatigue, insomnia, weight loss and increased depression. Has been having diarrhea and nausea and mild tremors. Lethargic and restless. Skin assessment performed by this RN assisted by Janne Lab, RN. No abnormal findings. Skin is within normal limits. No open wound, no rash, no bruises. Patient was educated and oriented to the unit. Food provided. Safety precautions initiated.

## 2016-12-23 NOTE — Consult Note (Signed)
[name]   Reason for Consult:  Consult for 51 year old man with alcohol abuse and mood symptoms Referring Physician:  Jimmye Norman Patient Identification: Samuel [name] MRN:  [id number] Principal Diagnosis: Substance induced mood disorder ([place]) Diagnosis:   Patient Active Problem List   Diagnosis Date Noted  . Substance induced mood disorder ([place]) [F19.94] 12/23/2016  . Alcohol abuse [F10.10] 12/23/2016    Total Time spent with patient: 1 hour  Subjective:   Samuel [name] is a 51 y.o. male patient admitted with "I've had a lot of stress".  HPI:  Patient interviewed. Chart reviewed. 51 year old man with a history of alcohol abuse. Today he was drinking heavily. He's been under a lot of stress lately. He lost his job, his girlfriend left him and left him homeless. He's been feeling overwhelmed and alone. Drank very heavily. Drinks most days. Has blackouts frequently. Get shaky when he tries to stop. Mood has been down and sad. Passive suicidal thoughts without specific intent or plan. No hallucinations. No homicidal thoughts. He is only occasionally compliant with his prescribed medicine for his diabetes. Reports self-care.  Medical history: Has had several episodes of pancreatitis and has diabetes. Not compliant with medication. Probably has had liver problems related to alcohol abuse.  Social history: Currently homeless. Put out of the apartment by his girlfriend. Not working right now. Feels overwhelmed and without resources.  Substance abuse history: Long-standing alcohol abuse. Denies that he is ever been in any kind of detox or mental health treatment in the past. Does not know of any history of seizures or delirium tremens.  Past Psychiatric History: No history of psychiatric hospitalization. No history of suicide attempt. Never been on any psychiatric medicine he knows of.  Risk to Self: Suicidal Ideation: Yes-Currently Present Suicidal Intent: Yes-Currently  Present Is patient at risk for suicide?: Yes Suicidal Plan?: Yes-Currently Present Specify Current Suicidal Plan: Pt reports plan to drink himself to death. Access to Means: Yes Specify Access to Suicidal Means: Pt has access to alcohol What has been your use of drugs/alcohol within the last 12 months?: 4 40oz beer How many times?: 0 Other Self Harm Risks: none identified Triggers for Past Attempts: None known Intentional Self Injurious Behavior: None Risk to Others: Homicidal Ideation: No Thoughts of Harm to Others: No Current Homicidal Intent: No Current Homicidal Plan: No Access to Homicidal Means: No Identified Victim: none identified History of harm to others?: No Assessment of Violence: None Noted Violent Behavior Description: None identified Does patient have access to weapons?: No Criminal Charges Pending?: No Does patient have a court date: No Prior Inpatient Therapy: Prior Inpatient Therapy: No Prior Therapy Dates: na Prior Therapy Facilty/Provider(s): na Reason for Treatment: na Prior Outpatient Therapy: Prior Outpatient Therapy: No Prior Therapy Dates: na Prior Therapy Facilty/Provider(s): na Reason for Treatment: na Does patient have an ACCT team?: No Does patient have Intensive In-House Services?  : No Does patient have [facility] services? : No Does patient have P4CC services?: No  Past Medical History:  Past Medical History:  Diagnosis Date  . Diabetes mellitus without complication ([place])   . Pancreatitis unk   History reviewed. No pertinent surgical history. Family History: History reviewed. No pertinent family history. Family Psychiatric  History: No known family mental health history Social History:  History  Alcohol Use  . 30.0 oz/week  . 50 Cans of beer per week     History  Drug Use No    Social History   Social History  .  Marital status: Single    Spouse name: N/A  . Number of children: N/A  . Years of education: N/A   Social History  Main Topics  . Smoking status: Current Every Day Smoker    Packs/day: 2.00    Types: Cigarettes  . Smokeless tobacco: Never Used  . Alcohol use 30.0 oz/week    50 Cans of beer per week  . Drug use: No  . Sexual activity: Not Asked   Other Topics Concern  . None   Social History Narrative  . None   Additional Social History:    Allergies:   Allergies  Allergen Reactions  . Penicillins Hives    Labs:  Results for orders placed or performed during the hospital encounter of 12/22/16 (from the past 48 hour(s))  Comprehensive metabolic panel     Status: Abnormal   Collection Time: 12/22/16  7:25 PM  Result Value Ref Range   Sodium 132 (L) 135 - 145 mmol/L   Potassium 3.8 3.5 - 5.1 mmol/L   Chloride 99 (L) 101 - 111 mmol/L   CO2 22 22 - 32 mmol/L   Glucose, Bld 334 (H) 65 - 99 mg/dL   BUN 9 6 - 20 mg/dL   Creatinine, Ser 0.59 (L) 0.61 - 1.24 mg/dL   Calcium 9.0 8.9 - 10.3 mg/dL   Total Protein 7.4 6.5 - 8.1 g/dL   Albumin 3.8 3.5 - 5.0 g/dL   AST 91 (H) 15 - 41 U/L   ALT 99 (H) 17 - 63 U/L   Alkaline Phosphatase 98 38 - 126 U/L   Total Bilirubin 0.4 0.3 - 1.2 mg/dL   GFR calc non Af Amer >60 >60 mL/min   GFR calc Af Amer >60 >60 mL/min    Comment: (NOTE) The eGFR has been calculated using the CKD EPI equation. This calculation has not been validated in all clinical situations. eGFR's persistently <60 mL/min signify possible Chronic Kidney Disease.    Anion gap 11 5 - 15  Ethanol     Status: Abnormal   Collection Time: 12/22/16  7:25 PM  Result Value Ref Range   Alcohol, Ethyl (B) 137 (H) <5 mg/dL    Comment:        LOWEST DETECTABLE LIMIT FOR SERUM ALCOHOL IS 5 mg/dL FOR MEDICAL PURPOSES ONLY   Salicylate level     Status: None   Collection Time: 12/22/16  7:25 PM  Result Value Ref Range   Salicylate Lvl <8.1 2.8 - 30.0 mg/dL  Acetaminophen level     Status: Abnormal   Collection Time: 12/22/16  7:25 PM  Result Value Ref Range   Acetaminophen (Tylenol),  Serum <10 (L) 10 - 30 ug/mL    Comment:        THERAPEUTIC CONCENTRATIONS VARY SIGNIFICANTLY. A RANGE OF 10-30 ug/mL MAY BE AN EFFECTIVE CONCENTRATION FOR MANY PATIENTS. HOWEVER, SOME ARE BEST TREATED AT CONCENTRATIONS OUTSIDE THIS RANGE. ACETAMINOPHEN CONCENTRATIONS >150 ug/mL AT 4 HOURS AFTER INGESTION AND >50 ug/mL AT 12 HOURS AFTER INGESTION ARE OFTEN ASSOCIATED WITH TOXIC REACTIONS.   cbc     Status: Abnormal   Collection Time: 12/22/16  7:25 PM  Result Value Ref Range   WBC 10.3 3.8 - 10.6 K/uL   RBC 4.37 (L) 4.40 - 5.90 MIL/uL   Hemoglobin 14.4 13.0 - 18.0 g/dL   HCT 41.9 40.0 - 52.0 %   MCV 95.8 80.0 - 100.0 fL   MCH 32.9 26.0 - 34.0 pg   MCHC 34.4 32.0 -  36.0 g/dL   RDW 13.0 11.5 - 14.5 %   Platelets 249 150 - 440 K/uL  Urine Drug Screen, Qualitative     Status: None   Collection Time: 12/22/16  7:25 PM  Result Value Ref Range   Tricyclic, Ur Screen NONE DETECTED NONE DETECTED   Amphetamines, Ur Screen NONE DETECTED NONE DETECTED   MDMA (Ecstasy)Ur Screen NONE DETECTED NONE DETECTED   Cocaine Metabolite,Ur [place] NONE DETECTED NONE DETECTED   Opiate, Ur Screen NONE DETECTED NONE DETECTED   Phencyclidine (PCP) Ur S NONE DETECTED NONE DETECTED   Cannabinoid 50 Ng, Ur [place] NONE DETECTED NONE DETECTED   Barbiturates, Ur Screen NONE DETECTED NONE DETECTED   Benzodiazepine, Ur Scrn NONE DETECTED NONE DETECTED   Methadone Scn, Ur NONE DETECTED NONE DETECTED    Comment: (NOTE) 416  Tricyclics, urine               Cutoff 1000 ng/mL 200  Amphetamines, urine             Cutoff 1000 ng/mL 300  MDMA (Ecstasy), urine           Cutoff 500 ng/mL 400  Cocaine Metabolite, urine       Cutoff 300 ng/mL 500  Opiate, urine                   Cutoff 300 ng/mL 600  Phencyclidine (PCP), urine      Cutoff 25 ng/mL 700  Cannabinoid, urine              Cutoff 50 ng/mL 800  Barbiturates, urine             Cutoff 200 ng/mL 900  Benzodiazepine, urine           Cutoff 200 ng/mL 1000 Methadone,  urine                Cutoff 300 ng/mL 1100 1200 The urine drug screen provides only a preliminary, unconfirmed 1300 analytical test result and should not be used for non-medical 1400 purposes. Clinical consideration and professional judgment should 1500 be applied to any positive drug screen result due to possible 1600 interfering substances. A more specific alternate chemical method 1700 must be used in order to obtain a confirmed analytical result.  1800 Gas chromato graphy / mass spectrometry (GC/MS) is the preferred 1900 confirmatory method.   Glucose, capillary     Status: Abnormal   Collection Time: 12/23/16 12:41 AM  Result Value Ref Range   Glucose-Capillary 244 (H) 65 - 99 mg/dL    Current Facility-Administered Medications  Medication Dose Route Frequency Provider Last Rate Last Dose  . citalopram (CELEXA) tablet 20 mg  20 mg Oral Daily Gonzella Lex, MD      . LORazepam (ATIVAN) tablet 0-4 mg  0-4 mg Oral Q6H Paulette Blanch, MD   1 mg at 12/23/16 1202   Followed by  . [START ON 12/25/2016] LORazepam (ATIVAN) tablet 0-4 mg  0-4 mg Oral Q12H Paulette Blanch, MD      . metFORMIN (GLUCOPHAGE) tablet 500 mg  500 mg Oral BID WC Paulette Blanch, MD   500 mg at 12/23/16 1751   Current Outpatient Prescriptions  Medication Sig Dispense Refill  . acetaminophen (TYLENOL) 325 MG tablet Take 650 mg by mouth every 4 (four) hours as needed.    [name] ibuprofen (ADVIL,MOTRIN) 200 MG tablet Take 800 mg by mouth every 6 (six) hours as needed.    [name] ibuprofen (  ADVIL,MOTRIN) 800 MG tablet Take 1 tablet (800 mg total) by mouth every 8 (eight) hours as needed. (Patient not taking: Reported on 12/22/2016) 30 tablet 0  . metFORMIN (GLUCOPHAGE) 500 MG tablet Take 1 tablet (500 mg total) by mouth 2 (two) times daily with a meal. 60 tablet 3    Musculoskeletal: Strength & Muscle Tone: within normal limits Gait & Station: normal Patient leans: N/A  Psychiatric Specialty Exam: Physical Exam  Nursing note and vitals  reviewed. Constitutional: He appears well-developed and well-nourished.  HENT:  Head: Normocephalic and atraumatic.  Eyes: Conjunctivae are normal. Pupils are equal, round, and reactive to light.  Neck: Normal range of motion.  Cardiovascular: Regular rhythm and normal heart sounds.   Respiratory: Effort normal. No respiratory distress.  GI: Soft.  Musculoskeletal: Normal range of motion.  Neurological: He is alert.  Skin: Skin is warm and dry.  Psychiatric: His affect is blunt. His speech is delayed. He is slowed and withdrawn. He expresses impulsivity. He exhibits a depressed mood. He expresses suicidal ideation. He expresses no suicidal plans. He exhibits abnormal recent memory.    Review of Systems  Constitutional: Negative.   HENT: Negative.   Eyes: Negative.   Respiratory: Negative.   Cardiovascular: Negative.   Gastrointestinal: Positive for abdominal pain.  Genitourinary: Positive for flank pain.  Skin: Negative.   Neurological: Negative.   Psychiatric/Behavioral: Positive for depression, memory loss, substance abuse and suicidal ideas. Negative for hallucinations. The patient has insomnia. The patient is not nervous/anxious.     Blood pressure 129/76, pulse 93, temperature 98.1 F (36.7 C), temperature source Oral, resp. rate 18, height _0  (1.727 m), weight 72.6 kg (160 lb), SpO2 96 %.Body mass index is 24.33 kg/m.  General Appearance: Disheveled  Eye Contact:  Minimal  Speech:  Slow  Volume:  Decreased  Mood:  Dysphoric  Affect:  Constricted  Thought Process:  Goal Directed  Orientation:  Full (Time, Place, and Person)  Thought Content:  Tangential  Suicidal Thoughts:  Yes.  without intent/plan  Homicidal Thoughts:  No  Memory:  Immediate;   Good Recent;   Fair Remote;   Fair  Judgement:  Impaired  Insight:  Shallow  Psychomotor Activity:  Decreased  Concentration:  Concentration: Fair  Recall:  [employer] of Knowledge:  Fair  Language:  Fair  Akathisia:   No  Handed:  Right  AIMS (if indicated):     Assets:  Desire for Improvement Resilience  ADL's:  Impaired  Cognition:  Impaired,  Mild  Sleep:        Treatment Plan Summary: Plan 51 year old man with alcohol abuse. Presented intoxicated to the emergency room. He is feeling a little bit jittery and sick to his stomach. Has blackouts with alcohol abuse. Mood is depressed with passive suicidal thoughts. He is not eligible to go to residential treatment services. We'll admit him to the psychiatric unit here for stabilization. Supportive counseling completed and reviewed plan with patient. Detox medicines in place. Continue metformin. Start citalopram for depression. 15 minute checks in place. Labs will be evaluated.  Disposition: Recommend psychiatric Inpatient admission when medically cleared.  Alethia Berthold, MD 12/23/2016 7:12 PM

## 2016-12-23 NOTE — ED Notes (Addendum)
Pt. To BHU from ED ambulatory without difficulty, to room  BHU 2. Report from [facility]. Pt. Is alert and oriented, warm and dry in no distress. Pt. Denies SI, HI, and AVH. Pt. Calm and cooperative. Pt complained of withdraw symptoms from alcohol CIWA = 6. Pt. Made aware of security cameras and Q15 minute rounds. Pt. Encouraged to let Nursing staff know of any concerns or needs.

## 2016-12-23 NOTE — ED Notes (Signed)
Pt up to bathroom, requesting medication for arm pain.

## 2016-12-23 NOTE — BHH Counselor (Signed)
Per Dr. Weber Cooks, pt meets criteria for inpatient admission.  Case reviewed with BMU Charge Nurse, Cyril Mourning.  Pt has been accepted at [facility]. Bed assignment:  460Q Attending MD: Dr. Jerilee Hoh. (Notified: Johnney Ou, BHU RN, Patient Access, EDP).

## 2016-12-23 NOTE — ED Notes (Signed)
Pt states he drinks 18-24 beers a day.

## 2016-12-23 NOTE — Plan of Care (Signed)
Problem: Education: Goal: Utilization of techniques to improve thought processes will improve Outcome: Progressing Behavior expectations discussed and understood

## 2016-12-23 NOTE — ED Notes (Signed)
Pt. Alert and oriented, warm and dry, in no distress. Pt. Denies SI, HI, and AVH. Pt. Encouraged to let nursing staff know of any concerns or needs. 

## 2016-12-23 NOTE — ED Notes (Signed)
Pt given medication for pain as ordered by ER MD.

## 2016-12-23 NOTE — ED Notes (Signed)

## 2016-12-23 NOTE — Tx Team (Signed)
Initial Treatment Plan 12/23/2016 11:51 PM [name] [id number]    PATIENT STRESSORS: Financial difficulties Marital or family conflict Medication change or noncompliance Occupational concerns Substance abuse   PATIENT STRENGTHS: Capable of independent living Communication skills General fund of knowledge Supportive family/friends   PATIENT IDENTIFIED PROBLEMS: "Get a hold of my alcohol problem." 12/23/2016  "Some help with my depresson." 12/23/2016                   DISCHARGE CRITERIA:  Adequate post-discharge living arrangements Improved stabilization in mood, thinking, and/or behavior Need for constant or close observation no longer present Verbal commitment to aftercare and medication compliance Withdrawal symptoms are absent or subacute and managed without 24-hour nursing intervention  PRELIMINARY DISCHARGE PLAN: Attend 12-step recovery group Outpatient therapy Placement in alternative living arrangements  PATIENT/FAMILY INVOLVEMENT: This treatment plan has been presented to and reviewed with the patient, Samuel [name].  The patient has been given the opportunity to ask questions and make suggestions.  Philomena Doheny, RN 12/23/2016, 11:51 PM

## 2016-12-23 NOTE — ED Notes (Addendum)
CBG 244. Pt type 2 diabetic, takes metformin '500mg'$  PO BID. Pt has not taken "in months". Pt reports 244 CBG "is good for me".   EDP informed.

## 2016-12-23 NOTE — ED Notes (Signed)
TTS called this writer to inform patient has been accepted to the BMU to bed 306 A. Admitting Dr Bari Edward, with Dr Jerilee Hoh as Attending.

## 2016-12-23 NOTE — ED Notes (Signed)
EDP made aware of Pt BP and CIWA score. Orders received for CIWA and Ativan protocol.

## 2016-12-23 NOTE — Tx Team (Signed)
Initial Treatment Plan 12/23/2016 11:55 PM Samuel [name] [id number]    PATIENT STRESSORS: Financial difficulties Substance abuse   PATIENT STRENGTHS: Curator fund of knowledge Motivation for treatment/growth   PATIENT IDENTIFIED PROBLEMS:     Substance Abuse  Depression    "To set up long term treatment"           DISCHARGE CRITERIA:  Improved stabilization in mood, thinking, and/or behavior Motivation to continue treatment in a less acute level of care  PRELIMINARY DISCHARGE PLAN: Outpatient therapy  PATIENT/FAMILY INVOLVEMENT: This treatment plan has been presented to and reviewed with the patient, Samuel [name], and/or family member,.  The patient and family have been given the opportunity to ask questions and make suggestions.  Amie Portland, RN 12/23/2016, 11:55 PM

## 2016-12-23 NOTE — ED Notes (Signed)
Pt pleasant and cooperative with RN. Pt endorses SI. "If I left here I don't know what I would do." Pt denies SI/AVH.  Pain 8/10 in right shoulder, arm and ribs. ER MD to be notified. No further concerns voiced. Maintained on 15 minute checks and observation by security camera for safety.

## 2016-12-23 NOTE — ED Notes (Signed)
Pt will be referred to [facility] per psychiatrist. Pt accepting. No concerns voiced. Maintained on 15 minute checks and observation by security camera for safety.

## 2016-12-23 NOTE — ED Notes (Addendum)
Pt walked to [facility] with ED Tech and ODS officer.   Report to Maudie Mercury, Therapist, sports.

## 2016-12-23 NOTE — ED Notes (Signed)
Report called to [employer] in BMU.

## 2016-12-24 DIAGNOSIS — F1994 Other psychoactive substance use, unspecified with psychoactive substance-induced mood disorder: Secondary | ICD-10-CM

## 2016-12-24 MED ORDER — CITALOPRAM HYDROBROMIDE 20 MG PO TABS
20.0000 mg | ORAL_TABLET | Freq: Every day | ORAL | Status: DC
  Administered 2016-12-24 – 2016-12-28 (×5): 20 mg via ORAL
  Filled 2016-12-24 (×5): qty 1

## 2016-12-24 MED ORDER — LORAZEPAM 2 MG PO TABS
0.0000 mg | ORAL_TABLET | Freq: Four times a day (QID) | ORAL | Status: AC
  Filled 2016-12-24: qty 1

## 2016-12-24 MED ORDER — ALUM & MAG HYDROXIDE-SIMETH 200-200-20 MG/5ML PO SUSP: 30.0000 mL | ORAL | Status: DC | PRN

## 2016-12-24 MED ORDER — NICOTINE 21 MG/24HR TD PT24
21.0000 mg | MEDICATED_PATCH | Freq: Every day | TRANSDERMAL | Status: DC
  Administered 2016-12-24 – 2016-12-28 (×5): 21 mg via TRANSDERMAL
  Filled 2016-12-24 (×5): qty 1

## 2016-12-24 MED ORDER — LORAZEPAM 2 MG PO TABS
0.0000 mg | ORAL_TABLET | Freq: Two times a day (BID) | ORAL | Status: DC
  Administered 2016-12-25 – 2016-12-26 (×3): 2 mg via ORAL
  Filled 2016-12-24 (×2): qty 1

## 2016-12-24 MED ORDER — ACETAMINOPHEN 325 MG PO TABS
650.0000 mg | ORAL_TABLET | Freq: Four times a day (QID) | ORAL | Status: DC | PRN
Start: 2016-12-24 — End: 2016-12-28
  Administered 2016-12-24 – 2016-12-27 (×3): 650 mg via ORAL
  Filled 2016-12-24 (×3): qty 2

## 2016-12-24 MED ORDER — METFORMIN HCL 500 MG PO TABS
500.0000 mg | ORAL_TABLET | Freq: Two times a day (BID) | ORAL | Status: DC
  Administered 2016-12-24 – 2016-12-26 (×5): 500 mg via ORAL
  Filled 2016-12-24 (×5): qty 1

## 2016-12-24 MED ORDER — MAGNESIUM HYDROXIDE 400 MG/5ML PO SUSP
30.0000 mL | Freq: Every day | ORAL | Status: DC | PRN
Start: 2016-12-24 — End: 2016-12-28

## 2016-12-24 NOTE — BHH Group Notes (Signed)
[facility] LCSW Group Therapy  12/24/2016 12:54 PM  Type of Therapy:  Group Therapy  Participation Level:  Active  Participation Quality:  Appropriate  Affect:  Appropriate  Cognitive:  Appropriate  Insight:  Developing/Improving  Engagement in Therapy:  Developing/Improving  Modes of Intervention:  Discussion, Education, Exploration and Support  Summary of Progress/Problems: The topic for group was balance in life. Today's group focused on defining balance in one's own words, identifying things that can knock one off balance, and exploring healthy ways to maintain balance in life. Group members were asked to provide an example of a time when they felt off balance, describe how they handled that situation, and process healthier ways to regain balance in the future. Group members were asked to share the most important tool for maintaining balance that they learned while at [facility] and how they plan to apply this method after discharge. Patient was rather withdrawn a bit today and did laugh appropriate at others during group activities.    [employer] LCSW [phone]  Joana Reamer 12/24/2016,
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
## 2016-12-24 NOTE — Progress Notes (Signed)
Care of patient taken over from [place] RN  D: Patient is alert and oriented on the unit this shift. Patient not attended and actively participated in groups today. Patient denies suicidal ideation, homicidal ideation, auditory or visual hallucinations at the present time.  A: Scheduled medications are administered to patient as per MD orders. Emotional support and encouragement are provided. Patient is maintained on q.15 minute safety checks. Patient is informed to notify staff with questions or concerns. R: No adverse medication reactions are noted. Patient is cooperative with medication administration  . Patient is receptive, calm,depressed and anxious and cooperative on the unit at this time. Patient does not interact with others on the unit this shift. Patient contracts for safety at this time. Patient remains safe at this time. Depression 8/10 Anxiety 8/10
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
## 2016-12-24 NOTE — BHH Group Notes (Signed)
[place] Group Notes:  (Nursing/MHT/Case Management/Adjunct)  Date:  12/24/2016  Time:  9:23 PM  Type of Therapy:  Psychoeducational Skills  Participation Level:  Active  Participation Quality:  Appropriate, Attentive and Sharing  Affect:  Appropriate  Cognitive:  Appropriate  Insight:  Appropriate and Good  Engagement in Group:  Engaged  Modes of Intervention:  Discussion, Socialization and Support  Summary of Progress/Problems:  Samuel [name] 12/24/2016, 9:23 PM

## 2016-12-24 NOTE — BHH Suicide Risk Assessment (Signed)
[facility] Admission Suicide Risk Assessment   Nursing information obtained from:   review of chart and discussion with nursing staff Demographic factors:   male. Recently lost living place. Substance abuse. Depressed mood Current Mental Status:   dysphoric and anxious. Denies suicidal ideation. Not psychotic. Loss Factors:   loss of a place to live. 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 No history of suicide attempt Risk Reduction Factors:   denies suicidal ideation. Has some positive things in his life he can articulate  Total Time spent with patient: 45 minutes Principal Problem: Substance induced mood disorder ([place]) Diagnosis:   Patient Active Problem List   Diagnosis Date Noted  . Substance induced mood disorder ([place]) [F19.94] 12/23/2016  . Alcohol abuse [F10.10] 12/23/2016   Subjective Data: Patient interviewed. Seen yesterday in the emergency room. Casually dressed gentleman moving up and around the unit. Says his mood today is feeling anxious but less depressed. Denies suicidal ideation. Has some complaints of alcohol withdrawal symptoms including tremulousness area denies any hallucinations.  Continued Clinical Symptoms:  Alcohol Use Disorder Identification Test Final Score (AUDIT): 29 The "Alcohol Use Disorders Identification Test", Guidelines for Use in Primary Care, Second Edition.  World Pharmacologist [facility]). Score between 0-7:  no or low risk or alcohol related problems. Score between 8-15:  moderate risk of alcohol related problems. Score between 16-19:  high risk of alcohol related problems. Score 20 or above:  warrants further diagnostic evaluation for alcohol dependence and treatment.   CLINICAL FACTORS:   Depression:   Anhedonia Dysthymia Alcohol/Substance Abuse/Dependencies   Musculoskeletal: Strength & Muscle Tone: within normal limits Gait & Station: unsteady Patient leans:  N/A  Psychiatric Specialty Exam: Physical Exam  Nursing note and vitals reviewed. Constitutional: He appears well-developed and well-nourished.  HENT:  Head: Normocephalic and atraumatic.  Eyes: Conjunctivae are normal. Pupils are equal, round, and reactive to light.  Neck: Normal range of motion.  Cardiovascular: Regular rhythm and normal heart sounds.   Respiratory: Effort normal.  GI: Soft.  Musculoskeletal: Normal range of motion.  Neurological: He is alert. He displays tremor.  Skin: Skin is warm and dry.  Psychiatric: Judgment normal. His mood appears anxious. His speech is delayed. He is slowed. He expresses no suicidal ideation. He exhibits abnormal recent memory.    Review of Systems  Constitutional: Negative.   HENT: Negative.   Eyes: Negative.   Respiratory: Negative.   Cardiovascular: Negative.   Gastrointestinal: Negative.   Musculoskeletal: Negative.   Skin: Negative.   Neurological: Positive for dizziness and tremors.  Psychiatric/Behavioral: Positive for depression, memory loss and substance abuse. Negative for hallucinations and suicidal ideas. The patient is nervous/anxious and has insomnia.     Blood pressure 135/81, pulse 87, temperature 98.7 F (37.1 C), temperature source Oral, resp. rate 18, height '5\' 8"'$  (1.727 m), weight 74.4 kg (164 lb 1.6 oz), SpO2 100 %.Body mass index is 24.95 kg/m.  General Appearance: Casual  Eye Contact:  Fair  Speech:  Slow  Volume:  Decreased  Mood:  Dysphoric  Affect:  Depressed  Thought Process:  Goal Directed  Orientation:  Full (Time, Place, and Person)  Thought Content:  Logical  Suicidal Thoughts:  No  Homicidal Thoughts:  No  Memory:  Immediate;   Good Recent;   Fair Remote;   Fair  Judgement:  Fair  Insight:  Fair  Psychomotor Activity:  Decreased  Concentration:  Concentration: Fair  Recall:  Fair  Fund of Knowledge:  Fair  Language:  Fair  Akathisia:  No  Handed:  Right  AIMS (if indicated):      Assets:  Desire for Improvement  ADL's:  Intact  Cognition:  Impaired,  Mild  Sleep:  Number of Hours: 5.3      COGNITIVE FEATURES THAT CONTRIBUTE TO RISK:  Loss of executive function    SUICIDE RISK:   Mild:  Suicidal ideation of limited frequency, intensity, duration, and specificity.  There are no identifiable plans, no associated intent, mild dysphoria and related symptoms, good self-control (both objective and subjective assessment), few other risk factors, and identifiable protective factors, including available and accessible social support.  PLAN OF CARE: Patient is on 15 minute checks. Continue alcohol detox. Medicine started for depression. Including group and individual therapy and regularly reassess suicidal thinking  I certify that inpatient services furnished can reasonably be expected to improve the patient's condition.   Alethia Berthold, MD 12/24/2016, 3:04 PM

## 2016-12-24 NOTE — Plan of Care (Signed)
Problem: Coping: Goal: Ability to cope will improve Outcome: Not Progressing [place] RN   Patient new to unit and not progressing at this time

## 2016-12-24 NOTE — Progress Notes (Signed)
D: Patient  Voice of drinking a 24 pack of beers daily of recent weeks . Stated his girlfriend left with the rent  Money . Stated he has  Lost his house and his job. Goal for today work on coping skill Patient stated slept                      poor last night .Stated appetite poor and energy level  Is normal. Stated concentration is good . Stated on Depression scale 7 , hopeless 7and anxiety 7 .( low 0-10 high) Denies suicidal  homicidal ideations  .  No auditory hallucinations  No pain concerns . Appropriate ADL'S. Interacting with peers and staff.  Voice of  Withdrawal  Symptoms tremors diarrhea chills agitation runny nose  Writer did not observe  Any.A: Encourage patient participation with unit programming . Instruction  Given on  Medication , verbalize understanding. R: Voice no other concerns. Staff continue to monitor

## 2016-12-24 NOTE — Progress Notes (Signed)
1930: Patient currently in the day room with staff and peers. Alert and oriented. Tired but reporting that "I finally got some rest". Pleasant and cooperative. Encouragements provided and safety maintained.

## 2016-12-24 NOTE — Plan of Care (Signed)
Problem: Coping: Goal: Ability to verbalize frustrations and anger appropriately will improve Outcome: Progressing Working on. Coping  Skills .   

## 2016-12-25 LAB — HEMOGLOBIN A1C
HEMOGLOBIN A1C: 11.3 % — AB (ref 4.8–5.6)
Mean Plasma Glucose: 278 mg/dL

## 2016-12-25 MED ORDER — TRAZODONE HCL 100 MG PO TABS
100.0000 mg | ORAL_TABLET | Freq: Every evening | ORAL | Status: DC | PRN
Start: 2016-12-25 — End: 2016-12-26
  Administered 2016-12-25: 100 mg via ORAL
  Filled 2016-12-25: qty 1

## 2016-12-25 NOTE — Progress Notes (Signed)
D: Pt denies SI/HI/AVH. Pt is pleasant and cooperative. Pt stated he felt "better" today due to not feeling as depressed. Pt stated he was hopeful for the future, pt is trying to get into [facility]. Pt is able to see things moving forward.   A: Pt was offered support and encouragement. Pt was given scheduled medications. Pt was encourage to attend groups. Q 15 minute checks were done for safety.   R:Pt attends groups and interacts well with peers and staff. Pt is taking medication. Pt has no complaints.Pt receptive to treatment and safety maintained on unit.

## 2016-12-25 NOTE — Progress Notes (Signed)
NUTRITION ASSESSMENT  Pt identified as at risk on the Malnutrition Screen Tool  INTERVENTION: 1. Educated patient on the importance of nutrition and encouraged intake of food and beverages. 2. Discussed weight goals. 3. Supplements: None at this time  NUTRITION DIAGNOSIS: Unintentional weight loss related to sub-optimal intake as evidenced by pt report.   Goal: Pt to meet >/= 90% of their estimated nutrition needs.  Monitor:  PO intake  Assessment:  Samuel [name] is a 51 yo male presents with depressed mood, suicidal ideations, alcohol abuse. Complains of poor appetite PTA related to alcohol use. Complains of 10# wt loss over 1 month - has regained a few pounds since admission. Overall 6#/3.5% insignificant wt loss over 1 month PO is 100% thus far.  Height: Ht Readings from Last 1 Encounters:  12/23/16 '5\' 8"'$  (1.727 m)    Weight: Wt Readings from Last 1 Encounters:  12/23/16 164 lb 1.6 oz (74.4 kg)    Weight Hx: Wt Readings from Last 10 Encounters:  12/23/16 164 lb 1.6 oz (74.4 kg)  12/22/16 160 lb (72.6 kg)  11/13/16 170 lb (77.1 kg)  10/26/15 170 lb (77.1 kg)  09/07/15 172 lb (78 kg)  04/02/15 172 lb (78 kg)    BMI:  Body mass index is 24.95 kg/m. Pt meets criteria for normal based on current BMI.  Estimated Nutritional Needs: Kcal: 25-30 kcal/kg Protein: > 1 gram protein/kg Fluid: 1 ml/kcal  Diet Order: Diet Carb Modified Fluid consistency: Thin; Room service appropriate? Yes Pt is also offered choice of unit snacks mid-morning and mid-afternoon.  Pt is eating as desired.   Lab results and medications reviewed.   Satira Anis. Chara Marquard, MS, RD LDN Inpatient Clinical Dietitian Pager [phone]

## 2016-12-25 NOTE — BHH Suicide Risk Assessment (Signed)
[place] INPATIENT:  Family/Significant Other Suicide Prevention Education  Suicide Prevention Education:  Education Completed;[place] Drollinger(sister [phone]), has been identified by the patient as the family member/significant other with whom the patient will be residing, and identified as the person(s) who will aid the patient in the event of a mental health crisis (suicidal ideations/suicide attempt).  With written consent from the patient, the family member/significant other has been provided the following suicide prevention education, prior to the and/or following the discharge of the patient. Sister confirms that patient will be residing with her in [place] once discharged. Sister stated she will work with patient on getting a new I.D. with [place] address so patient can go into residential treatment at [facility] in [place].   The suicide prevention education provided includes the following:  Suicide risk factors  Suicide prevention and interventions  National Suicide Hotline telephone number  [facility] assessment telephone number  [facility] Emergency Assistance [place] and/or Residential Mobile Crisis Unit telephone number  Request made of family/significant other to:  Remove weapons (e.g., guns, rifles, knives), all items previously/currently identified as safety concern.    Remove drugs/medications (over-the-counter, prescriptions, illicit drugs), all items previously/currently identified as a safety concern.  The family member/significant other verbalizes understanding of the suicide prevention education information provided.  The family member/significant other agrees to remove the items of safety concern listed above.  Kamarie Palma G. [place], [place] 12/25/2016, 1:41 PM

## 2016-12-25 NOTE — Plan of Care (Signed)
Problem: Coping: Goal: Ability to cope will improve Outcome: Progressing Pt stated he was not as depressed today as he as been in the past

## 2016-12-25 NOTE — BHH Group Notes (Signed)
Feb 4th 2018 1pm  Type of Therapy:  Group Therapy Emotional Regulation  Participation Level:  Active  Participation Quality:  Attentive  Affect:  Appropriate  Cognitive:  Appropriate  Insight:  Developing/Improving  Engagement in Therapy:  Developing/Improving  Modes of Intervention:  Activity, Discussion, Education, Exploration and Support  Summary of Progress/Problems:   The topic for group today was emotional regulation. This group focused on both positive and negative emotion identification and allowed group members to process ways to identify feelings, regulate negative emotions, and find healthy ways to manage internal/external emotions. Group members were asked to reflect on a time when their reaction to an emotion led to a negative outcome and explored how alternative responses using emotion regulation would have benefited them. Group members were also asked to discuss a time when emotion regulation was utilized when a negative emotion was experienced.  This patient today seemed to be more alert and had good group interaction and participation  Anilah Huck LCSW

## 2016-12-25 NOTE — Progress Notes (Signed)
Patient stated that he could not sleep last night.Denies suicidal or homicidal ideations and AV hallucinations.Stayed in most of the time.Rated his depression & anxiety 5/10.Denies suicidal ideations.Compliant with medications.Attended groups.Support & encouragement given.

## 2016-12-25 NOTE — Plan of Care (Signed)
Problem: Safety: Goal: Ability to disclose and discuss suicidal ideas will improve Outcome: Progressing Able to discuss his feelings and concerns. Currently denies SI/HI

## 2016-12-25 NOTE — Plan of Care (Signed)
Problem: Coping: Goal: Ability to demonstrate self-control will improve Outcome: Progressing Cooperative, interacting with staff and peers appropriately

## 2016-12-25 NOTE — Progress Notes (Signed)
Patient stayed around, often in the dayroom. Attended evening group, had a snack. Complained of shoulder pain and received Tylenol. Pleasant and cooperative "I feel rested, after taking a good shower". Had no concern throughout this shift. Currently in bed resting. Staff continue to monitor for safety and for other needs.

## 2016-12-25 NOTE — Progress Notes (Signed)
Samuel [name] Progress Note  12/25/2016 12:29 PM Samuel [name]  MRN:  [id number] Subjective:  Follow-up note for 51 year old man with alcohol abuse and depression. Patient states he is feeling better today in terms of his anxiety. Does not feel tremulous or overly nervous today area and still little bit worried although getting more confident. He reports that he had a great deal of difficulty sleeping last night and that his mind was racing but he has calm down a bit today. Denies any suicidal or homicidal ideation. He is neatly dressed today and participating in appropriate treatment on the unit. Principal Problem: Substance induced mood disorder ([place]) Diagnosis:   Patient Active Problem List   Diagnosis Date Noted  . Substance induced mood disorder ([place]) [F19.94] 12/23/2016  . Alcohol abuse [F10.10] 12/23/2016   Total Time spent with patient: 30 minutes  Past Psychiatric History: Patient has a history of alcohol abuse and depression with no history of real suicide attempts. Has had major losses in his life related to his behavior and alcohol abuse. Minimal outpatient compliance in the past  Past Medical History:  Past Medical History:  Diagnosis Date  . Diabetes mellitus without complication ([place])   . Pancreatitis unk   History reviewed. No pertinent surgical history. Family History: History reviewed. No pertinent family history. Family Psychiatric  History: Positive for alcohol Social History:  History  Alcohol Use  . 30.0 oz/week  . 50 Cans of beer per week     History  Drug Use No    Social History   Social History  . Marital status: Single    Spouse name: N/A  . Number of children: N/A  . Years of education: N/A   Social History Main Topics  . Smoking status: Current Every Day Smoker    Packs/day: 2.00    Types: Cigarettes  . Smokeless tobacco: Never Used  . Alcohol use 30.0 oz/week    50 Cans of beer per week  . Drug use: No  . Sexual activity: Not Asked   Other  Topics Concern  . None   Social History Narrative  . None   Additional Social History:    Pain Medications: See PTA Prescriptions: See PTA Over the Counter: See PTA History of alcohol / drug use?: Yes Longest period of sobriety (when/how long): 25 years Negative Consequences of Use: Legal, Financial, Personal relationships, Work / School Withdrawal Symptoms: Sweats Name of Substance 1: Alcohol 1 - Age of First Use: 12 1 - Amount (size/oz): 40 oz 1 - Frequency: daily 1 - Duration: daily 1 - Last Use / Amount: 12/22/2016                  Sleep: Poor  Appetite:  Good  Current Medications: Current Facility-Administered Medications  Medication Dose Route Frequency Provider Last Rate Last Dose  . acetaminophen (TYLENOL) tablet 650 mg  650 mg Oral Q6H PRN Gonzella Lex, [name]   650 mg at 12/24/16 2131  . alum & mag hydroxide-simeth (MAALOX/MYLANTA) 200-200-20 MG/5ML suspension 30 mL  30 mL Oral Q4H PRN Gonzella Lex, [name]      . citalopram (CELEXA) tablet 20 mg  20 mg Oral Daily Gonzella Lex, [name]   20 mg at 12/25/16 0901  . LORazepam (ATIVAN) tablet 0-4 mg  0-4 mg Oral Q12H Gonzella Lex, [name]   2 mg at 12/25/16 0900  . magnesium hydroxide (MILK OF MAGNESIA) suspension 30 mL  30 mL Oral Daily PRN Lazariah Savard T Dawayne Ohair,  [name]      . metFORMIN (GLUCOPHAGE) tablet 500 mg  500 mg Oral BID WC Gonzella Lex, [name]   500 mg at 12/25/16 0850  . nicotine (NICODERM CQ - dosed in mg/24 hours) patch 21 mg  21 mg Transdermal Daily Gonzella Lex, [name]   21 mg at 12/25/16 0901  . traZODone (DESYREL) tablet 100 mg  100 mg Oral QHS PRN Gonzella Lex, [name]        Lab Results:  Results for orders placed or performed during the hospital encounter of 12/23/16 (from the past 48 hour(s))  Hemoglobin A1c     Status: Abnormal   Collection Time: 12/24/16  7:06 AM  Result Value Ref Range   Hgb A1c MFr Bld 11.3 (H) 4.8 - 5.6 %    Comment: (NOTE)         Pre-diabetes: 5.7 - 6.4         Diabetes: >6.4          Glycemic control for adults with diabetes: <7.0    Mean Plasma Glucose 278 mg/dL    Comment: (NOTE) Performed At: [facility] [address] [place], [place] [id number] Lindon Romp [name] EX:[id number]     Blood Alcohol level:  Lab Results  Component Value Date   ETH 137 (H) 12/22/2016   ETH 317 (HH) 09/17/2535    Metabolic Disorder Labs: Lab Results  Component Value Date   HGBA1C 11.3 (H) 12/24/2016   MPG 278 12/24/2016   No results found for: PROLACTIN No results found for: CHOL, TRIG, HDL, CHOLHDL, VLDL, LDLCALC  Physical Findings: AIMS: Facial and Oral Movements Muscles of Facial Expression: None, normal Lips and Perioral Area: None, normal Jaw: None, normal Tongue: None, normal,Extremity Movements Upper (arms, wrists, hands, fingers): None, normal Lower (legs, knees, ankles, toes): None, normal, Trunk Movements Neck, shoulders, hips: None, normal, Overall Severity Severity of abnormal movements (highest score from questions above): None, normal Incapacitation due to abnormal movements: None, normal Patient's awareness of abnormal movements (rate only patient's report): No Awareness, Dental Status Current problems with teeth and/or dentures?: Yes Does patient usually wear dentures?: No  CIWA:  CIWA-Ar Total: 0 COWS:  COWS Total Score: 5  Musculoskeletal: Strength & Muscle Tone: within normal limits Gait & Station: normal Patient leans: N/A  Psychiatric Specialty Exam: Physical Exam  Nursing note and vitals reviewed. Constitutional: He appears well-developed and well-nourished.  HENT:  Head: Normocephalic and atraumatic.  Eyes: Conjunctivae are normal. Pupils are equal, round, and reactive to light.  Neck: Normal range of motion.  Cardiovascular: Regular rhythm and normal heart sounds.   Respiratory: Effort normal and breath sounds normal. No respiratory distress.  GI: Soft.  Musculoskeletal: Normal range of motion.  Neurological: He is alert.   Skin: Skin is warm and dry.  Psychiatric: His speech is normal. Judgment and thought content normal. His mood appears anxious. He is slowed. Cognition and memory are normal.    Review of Systems  Constitutional: Negative.   HENT: Negative.   Eyes: Negative.   Respiratory: Negative.   Cardiovascular: Negative.   Gastrointestinal: Negative.   Musculoskeletal: Negative.   Skin: Negative.   Neurological: Negative.  Negative for tremors.  Psychiatric/Behavioral: Positive for substance abuse. Negative for depression, hallucinations, memory loss and suicidal ideas. The patient has insomnia. The patient is not nervous/anxious.     Blood pressure 124/85, pulse 84, temperature 98.5 F (36.9 C), temperature source Oral, resp. rate 20, height '5\' 8"'$  (1.727 m), weight  74.4 kg (164 lb 1.6 oz), SpO2 100 %.Body mass index is 24.95 kg/m.  General Appearance: Casual  Eye Contact:  Fair  Speech:  Clear and Coherent  Volume:  Decreased  Mood:  Anxious  Affect:  Congruent  Thought Process:  Goal Directed  Orientation:  Full (Time, Place, and Person)  Thought Content:  Logical  Suicidal Thoughts:  No  Homicidal Thoughts:  No  Memory:  Immediate;   Good Recent;   Fair Remote;   Fair  Judgement:  Fair  Insight:  Fair  Psychomotor Activity:  Normal  Concentration:  Concentration: Fair  Recall:  [employer] of Knowledge:  Fair  Language:  Fair  Akathisia:  No  Handed:  Right  AIMS (if indicated):     Assets:  Desire for Improvement Resilience  ADL's:  Intact  Cognition:  WNL  Sleep:  Number of Hours: 6     Treatment Plan Summary: Daily contact with patient to assess and evaluate symptoms and progress in treatment, Medication management and Plan 51 year old man with depression and alcohol abuse. Mood is improving. Anxiety is improving. His ability to think lucidly about a future plan is improving. No longer feeling hopeless. Alcohol withdrawal symptoms appeared to be remitting and his vitals  are stable and he is not tremulous today. I have added a when necessary dose of trazodone if he continues to have difficulty sleeping. Continue the current citalopram for depression. Supportive counseling and encouraged patient to think seriously about discharge planning. Estimated length of stay 1-2 days.  Alethia Berthold, [name] 12/25/2016, 12:29 PM

## 2016-12-26 ENCOUNTER — Encounter: Payer: Self-pay | Admitting: Psychiatry

## 2016-12-26 DIAGNOSIS — F10939 Alcohol use, unspecified with withdrawal, unspecified: Secondary | ICD-10-CM

## 2016-12-26 DIAGNOSIS — F329 Major depressive disorder, single episode, unspecified: Secondary | ICD-10-CM

## 2016-12-26 DIAGNOSIS — E119 Type 2 diabetes mellitus without complications: Secondary | ICD-10-CM

## 2016-12-26 DIAGNOSIS — F10239 Alcohol dependence with withdrawal, unspecified: Secondary | ICD-10-CM

## 2016-12-26 DIAGNOSIS — F1020 Alcohol dependence, uncomplicated: Secondary | ICD-10-CM

## 2016-12-26 DIAGNOSIS — F17200 Nicotine dependence, unspecified, uncomplicated: Secondary | ICD-10-CM

## 2016-12-26 HISTORY — DX: Alcohol dependence, uncomplicated: F10.20

## 2016-12-26 LAB — GLUCOSE, CAPILLARY
GLUCOSE-CAPILLARY: 426 mg/dL — AB (ref 65–99)
GLUCOSE-CAPILLARY: 338 mg/dL — AB (ref 65–99)
GLUCOSE-CAPILLARY: 145 mg/dL — AB (ref 65–99)

## 2016-12-26 MED ORDER — GLIMEPIRIDE 2 MG PO TABS
2.0000 mg | ORAL_TABLET | Freq: Every day | ORAL | Status: DC
  Administered 2016-12-26 – 2016-12-28 (×3): 2 mg via ORAL
  Filled 2016-12-26 (×3): qty 1

## 2016-12-26 MED ORDER — LIVING WELL WITH DIABETES BOOK
Freq: Once | Status: AC
  Administered 2016-12-26: 12:00:00
  Filled 2016-12-26: qty 1

## 2016-12-26 MED ORDER — INSULIN ASPART 100 UNIT/ML ~~LOC~~ SOLN
0.0000 [IU] | Freq: Three times a day (TID) | SUBCUTANEOUS | Status: DC
Start: 2016-12-26 — End: 2016-12-26

## 2016-12-26 MED ORDER — INSULIN ASPART 100 UNIT/ML ~~LOC~~ SOLN
0.0000 [IU] | Freq: Every day | SUBCUTANEOUS | Status: DC
  Filled 2016-12-26 (×2): qty 5
  Filled 2016-12-26: qty 1

## 2016-12-26 MED ORDER — METFORMIN HCL 500 MG PO TABS
1000.0000 mg | ORAL_TABLET | Freq: Two times a day (BID) | ORAL | Status: DC
  Administered 2016-12-26 – 2016-12-28 (×5): 1000 mg via ORAL
  Filled 2016-12-26 (×5): qty 2

## 2016-12-26 MED ORDER — TRAZODONE HCL 100 MG PO TABS
100.0000 mg | ORAL_TABLET | Freq: Every day | ORAL | Status: DC
  Administered 2016-12-26 – 2016-12-27 (×2): 100 mg via ORAL
  Filled 2016-12-26 (×2): qty 1

## 2016-12-26 MED ORDER — INSULIN ASPART 100 UNIT/ML ~~LOC~~ SOLN
0.0000 [IU] | Freq: Three times a day (TID) | SUBCUTANEOUS | Status: DC
Start: 2016-12-26 — End: 2016-12-28
  Administered 2016-12-26: 11 [IU] via SUBCUTANEOUS
  Administered 2016-12-26: 15 [IU] via SUBCUTANEOUS
  Administered 2016-12-27: 11 [IU] via SUBCUTANEOUS
  Administered 2016-12-27: 5 [IU] via SUBCUTANEOUS
  Administered 2016-12-27: 11 [IU] via SUBCUTANEOUS
  Administered 2016-12-27 – 2016-12-28 (×2): 15 [IU] via SUBCUTANEOUS
  Administered 2016-12-28: 5 [IU] via SUBCUTANEOUS
  Administered 2016-12-28: 15 [IU] via SUBCUTANEOUS
  Filled 2016-12-26: qty 11
  Filled 2016-12-26: qty 9
  Filled 2016-12-26 (×2): qty 15
  Filled 2016-12-26: qty 11
  Filled 2016-12-26: qty 15

## 2016-12-26 MED ORDER — INSULIN ASPART 100 UNIT/ML ~~LOC~~ SOLN: 0.0000 [IU] | Freq: Every day | SUBCUTANEOUS | Status: DC

## 2016-12-26 MED ORDER — CHLORDIAZEPOXIDE HCL 25 MG PO CAPS
25.0000 mg | ORAL_CAPSULE | Freq: Three times a day (TID) | ORAL | Status: DC
  Administered 2016-12-26 – 2016-12-28 (×8): 25 mg via ORAL
  Filled 2016-12-26 (×8): qty 1

## 2016-12-26 NOTE — Progress Notes (Signed)
Recreation Therapy Notes  Date: 02.05.18 Time: 1:00 pm Location: Craft Room  Group Topic: Self-expression  Goal Area(s) Addresses:  Patient will effectively use art as a means of self-expression. Patient will be able to identify one emotion experienced during group session.  Behavioral Response: Attentive, Interactive  Intervention: Bottled Up  Activity: Patients were instructed to draw a bottle the way they see themselves. Inside the bottle, patients were instructed to write all the emotions they were feeling.  Education: LRT educated patients on different forms of self-expression.  Education Outcome: Acknowledges education/In group clarification offered  Clinical Observations/Feedback: Patient drew bottle and wrote how he felt. Patient contributed to group discussion by explaining his bottle and how this activity made him feel.  Leonette Monarch, LRT/CTRS 12/26/2016 1:59 PM

## 2016-12-26 NOTE — Plan of Care (Signed)
Problem: Safety: Goal: Ability to disclose and discuss suicidal ideas will improve Outcome: Progressing Discussed his feeling and denying SI

## 2016-12-26 NOTE — Plan of Care (Signed)
Problem: Activity: Goal: Interest or engagement in leisure activities will improve Outcome: Progressing Was visible in the milieu, attended unit activities

## 2016-12-26 NOTE — BHH Group Notes (Signed)
[facility] Group Notes:  (Nursing/MHT/Case Management/Adjunct)  Date:  12/26/2016  Time:  12:46 AM  Type of Therapy:  Psychoeducational Skills  Participation Level:  Active  Participation Quality:  Appropriate and Sharing  Affect:  Appropriate  Cognitive:  Appropriate  Insight:  Appropriate and Good  Engagement in Group:  Engaged  Modes of Intervention:  Discussion, Socialization and Support  Summary of Progress/Problems:  Samuel [name] 12/26/2016, 12:46 AM
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
## 2016-12-26 NOTE — Progress Notes (Signed)
1945: Patient currently in the dayroom with staff and peers. Alert and oriented. Pleasant and denying SI/HI. Denying hallucinations. Reports that he is feeling more rested. "They finally put me on the medications that help me". Motivated for outpatient services upon discharge. Has no concern so far. Therapeutic milieu promoted. Support and encouragements provided. Patient's safety maintained.

## 2016-12-26 NOTE — Progress Notes (Signed)
Inpatient Diabetes Program Recommendations  AACE/ADA: New Consensus Statement on Inpatient Glycemic Control (2015)  Target Ranges:  Prepandial:   less than 140 mg/dL      Peak postprandial:   less than 180 mg/dL (1-2 hours)      Critically ill patients:  140 - 180 mg/dL  Results for Samuel [name], Samuel [name] (MRN [id number]) as of 12/26/2016 10:12  Ref. Range 12/23/2016 00:41  Glucose-Capillary Latest Ref Range: 65 - 99 mg/dL 244 (H)   Results for Samuel [name], Samuel [name] (MRN [id number]) as of 12/26/2016 10:12  Ref. Range 12/22/2016 19:25 12/24/2016 07:06  Glucose Latest Ref Range: 65 - 99 mg/dL 334 (H)   Hemoglobin A1C Latest Ref Range: 4.8 - 5.6 %  11.3 (H)   Review of Glycemic Control  Diabetes history: DM2 Outpatient Diabetes medications: Metformin 500 mg BID Current orders for Inpatient glycemic control: Metformin 500 mg BID  Inpatient Diabetes Program Recommendations: Correction (SSI): Please consider using Glycemic Control order set and ordering CBGs with Novolog 0-15 units TID (Moderate scale) with meals and Novolog 0-5 units QHS. Oral Agents: Please consider increasing Metformin to 1000 mg BID and adding Amaryl 2 mg daily. HgbA1C: A1C 11.3% on 12/24/2016 indicating an average glucose of 278 mg/dl over the past 2-3 months. Patient needs to follow up with PCP regarding DM control.  Thanks, Barnie Alderman, RN, MSN, CDE Diabetes Coordinator Inpatient Diabetes Program [phone] (Team Pager from 8am to 5pm)

## 2016-12-26 NOTE — Plan of Care (Signed)
Problem: Role Relationship: Goal: Ability to demonstrate positive changes in social behaviors and relationships will improve Outcome: Progressing Patient noted in dayroom socializing with peers.

## 2016-12-26 NOTE — Progress Notes (Signed)
Pt pleasant and cooperative. Reports he rested well. States, "my body feels better, but my depression is still here" Pt reports going to [facility] when discharged. Pt reports poor care of diabetes and beginning to have vision concerns.  Encouragement and support offered. Encouraged pt to attend group and manage diet in reference to diabetes. Diabetes wellness book given to patient. Safety checks maintained. Pt receptive, interacts appropriately with staff and peers. Medication taken as prescribed. Pt remains safe with q 15 min checks.
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
## 2016-12-27 LAB — GLUCOSE, CAPILLARY
GLUCOSE-CAPILLARY: 338 mg/dL — AB (ref 65–99)
Glucose-Capillary: 407 mg/dL — ABNORMAL HIGH (ref 65–99)
Glucose-Capillary: 223 mg/dL — ABNORMAL HIGH (ref 65–99)
Glucose-Capillary: 302 mg/dL — ABNORMAL HIGH (ref 65–99)

## 2016-12-27 MED ORDER — CITALOPRAM HYDROBROMIDE 20 MG PO TABS: 20.0000 mg | ORAL_TABLET | Freq: Every day | ORAL | 0 refills | Status: DC

## 2016-12-27 MED ORDER — GLIMEPIRIDE 2 MG PO TABS: 2.0000 mg | ORAL_TABLET | Freq: Every day | ORAL | 0 refills | Status: DC

## 2016-12-27 MED ORDER — METFORMIN HCL 1000 MG PO TABS: 1000.0000 mg | ORAL_TABLET | Freq: Two times a day (BID) | ORAL | 0 refills | Status: DC

## 2016-12-27 MED ORDER — TRAZODONE HCL 100 MG PO TABS: 100.0000 mg | ORAL_TABLET | Freq: Every day | ORAL | 0 refills | Status: DC

## 2016-12-27 NOTE — Plan of Care (Signed)
Problem: [facility] Participation in Recreation Therapeutic Interventions Goal: STG-Patient will identify at least five coping skills for ** STG: Coping Skills - Within 4 treatment sessions, patient will verbalize at least 5 coping skills for substance abuse in each of 2 treatment sessions to decrease substance abuse post d/c.  Outcome: Progressing Treatment Session 1; Completed 1 out of 2: At approximately 3:55 pm, LRT met with patient in consultation room. Patient verbalized 5 coping skills for substance abuse. LRT educated patient on leisure and why it is important to implement it into his schedule. LRT educated and provided patient with blank schedules to help him plan his day and try to avoid using substances. LRT educated patient on healthy support systems.  Leonette Monarch, LRT/CTRS 02.06.18 4:11 pm Goal: STG-Other Recreation Therapy Goal (Specify) STG: Stress Management - Within 4 treatment sessions, patient will verbalize understanding of the stress management techniques in each of 2 treatment sessions to increase stress management skills post d/c.  Outcome: Progressing Treatment Session 1; Completed 1 out of 2: At approximately 3:55 pm, LRT met with patient in consultation room. LRT educated and provided patient with handouts on stress management techniques. Patient verbalized understanding. LRT encouraged patient to read over and practice the stress management techniques.  Leonette Monarch, LRT/CTRS 02.06.18 4:13 pm
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
## 2016-12-27 NOTE — BHH Group Notes (Signed)
[place] Group Notes:  (Nursing/MHT/Case Management/Adjunct)  Date:  12/27/2016  Time:  4:04 PM  Type of Therapy:  Psychoeducational Skills  Participation Level:  Active  Participation Quality:  Appropriate, Attentive and Sharing  Affect:  Appropriate  Cognitive:  Alert and Appropriate  Insight:  Appropriate  Engagement in Group:  Engaged  Modes of Intervention:  Discussion, Education and Support  Summary of Progress/Problems:  Adela Lank Fabricio Endsley 12/27/2016, 4:04 PM

## 2016-12-27 NOTE — Progress Notes (Signed)
Inpatient Diabetes Program Recommendations  AACE/ADA: New Consensus Statement on Inpatient Glycemic Control (2015)  Target Ranges:  Prepandial:   less than 140 mg/dL      Peak postprandial:   less than 180 mg/dL (1-2 hours)      Critically ill patients:  140 - 180 mg/dL   Lab Results  Component Value Date   GLUCAP 338 (H) 12/27/2016   HGBA1C 11.3 (H) 12/24/2016    Review of Glycemic Control  Results for ZACKERIAH, KISSLER (MRN [id number]) as of 12/27/2016 08:28  Ref. Range 12/26/2016 12:24 12/26/2016 16:24 12/26/2016 20:40 12/27/2016 07:14  Glucose-Capillary Latest Ref Range: 65 - 99 mg/dL 426 (H) 338 (H) 145 (H) 338 (H)    Diabetes history: DM2 Outpatient Diabetes medications: Metformin 500 mg BID Current orders for Inpatient glycemic control: Metformin 1000 mg BID, Novolog moderate correction 0-15 units tid, Novolog 0-5 units qhs,  Amaryl '2mg'$  qam  Inpatient Diabetes Program Recommendations:  Fasting blood sugar remains high- will follow and make recommendations tomorrow- I want to give the Amaryl some time to work.  May want to assess the appropriateness of Metformin with this patient since he has been drinking a lot of alcohol - Metformin is contraindicated for binge drinkers.   Gentry Fitz, RN, BA, MHA, CDE Diabetes Coordinator Inpatient Diabetes Program  [phone] (Team Pager) [phone] ([place]) 12/27/2016 8:35 AM

## 2016-12-27 NOTE — Plan of Care (Signed)
Problem: Nutrition: Goal: Adequate nutrition will be maintained Outcome: Not Progressing Patient continues to have high blood sugar results. Pt not selecting diabetic snacks. Encouraged patient to make better snack choices.

## 2016-12-27 NOTE — BHH Group Notes (Signed)
Goals Group Date/Time: 12/27/2016 9:00 AM Type of Therapy and Topic: Group Therapy: Goals Group: SMART Goals   Participation Level: PT did not attend group.  Description of Group:    The purpose of a daily goals group is to assist and guide patients in setting recovery/wellness-related goals. The objective is to set goals as they relate to the crisis in which they were admitted. Patients will be using SMART goal modalities to set measurable goals. Characteristics of realistic goals will be discussed and patients will be assisted in setting and processing how one will reach their goal. Facilitator will also assist patients in applying interventions and coping skills learned in psycho-education groups to the SMART goal and process how one will achieve defined goal.   Therapeutic Goals:   -Patients will develop and document one goal related to or their crisis in which brought them into treatment.  -Patients will be guided by LCSW using SMART goal setting modality in how to set a measurable, attainable, realistic and time sensitive goal.  -Patients will process barriers in reaching goal.  -Patients will process interventions in how to overcome and successful in reaching goal.   Patient's Goal:   Therapeutic Modalities:  Motivational Interviewing  Cognitive Behavioral Therapy  Crisis Intervention Model  SMART goals setting   Lurline Idol, LCSW

## 2016-12-27 NOTE — Plan of Care (Signed)
Problem: Food- and Nutrition-Related Knowledge Deficit (NB-1.1) Goal: Nutrition education Formal process to instruct or train a patient/client in a skill or to impart knowledge to help patients/clients voluntarily manage or modify food choices and eating behavior to maintain or improve health. Outcome: Completed/Met Date Met: 12/27/16  RD consulted for nutrition education regarding diabetes.   Lab Results  Component Value Date   HGBA1C 11.3 (H) 12/24/2016   CBG: 145-426 past 24 hrs  Met with patient in day room. Patient reports he has had DM for the past 20 years but has never met with a Dietitian to discuss carbohydrate counting before. He reports his CBGs are typically 160-170 at home and almost never go over 200. Patient reports typical intake of 3 meals per day and one snack at bedtime.  Typical Intake:  Breakfast: eggs, 2 slices toast with margarine, sausage Lunch: six inch sub sandwich with [place], ham, lettuce, tomato, onions Dinner: meat loaf with potatoes Snack: apple with chips Beverages: water  RD provided "Carbohydrate Counting for People with Diabetes" handout from the Academy of Nutrition and Dietetics. Discussed different food groups and their effects on blood sugar, emphasizing carbohydrate-containing foods. Provided list of carbohydrates and recommended serving sizes of common foods.  RD provided "Using Nutrition Labels: Carbohydrates" handout from the Academy of Nutrition and Dietetics. Discussed how to read a nutrition label including looking at serving size and servings per container. Reviewed that there are 15 grams of carbohydrate in one carbohydrate choice. Encouraged patient to use chart for range of carbohydrate grams per choice when reading nutrition labels.  Discussed importance of controlled and consistent carbohydrate intake throughout the day. Provided examples of ways to balance meals/snacks and encouraged intake of high-fiber, whole grain complex  carbohydrates. Teach back method used.  Expect good compliance.  Body mass index is 24.95 kg/m. Pt meets criteria for Normal Weight based on current BMI.  Current diet order is Carbohydrate Modified, patient is consuming approximately 100% of meals at this time. Labs and medications reviewed. No further nutrition interventions warranted at this time. RD contact information provided. If additional nutrition issues arise, please re-consult RD.  Willey Blade, MS, RD, LDN Pager: [phone] After Hours Pager: [phone]
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
## 2016-12-27 NOTE — [facility] Group Notes (Signed)
[place] LCSW Group Therapy Note  Date/Time  Type of Therapy/Topic:  Group Therapy:  Feelings about Diagnosis  Participation Level:  Active   Mood:pleasant  Description of Group:    This group will allow patients to explore their thoughts and feelings about diagnoses they have received. Patients will be guided to explore their level of understanding and acceptance of these diagnoses. Facilitator will encourage patients to process their thoughts and feelings about the reactions of others to their diagnosis, and will guide patients in identifying ways to discuss their diagnosis with significant others in their lives. This group will be process-oriented, with patients participating in exploration of their own experiences as well as giving and receiving support and challenge from other group members.   Therapeutic Goals: 1. Patient will demonstrate understanding of diagnosis as evidence by identifying two or more symptoms of the disorder:  2. Patient will be able to express two feelings regarding the diagnosis 3. Patient will demonstrate ability to communicate their needs through discussion and/or role plays  Summary of Patient Progress: Pt came in late due but participated actively upon arrival.  Pt shared that he recently was charged with his 6th DUI in the past 9 years and he has had to accept his diagnosis as an alcoholic.  Pt identified feeling both relief and anxiety in accepting this diagnosis.  He also processed his family's frustration due to the multiple legal charges that have led up to this point.   Therapeutic Modalities:   Cognitive Behavioral Therapy Brief Therapy Feelings Identification   Lurline Idol, LCSW

## 2016-12-27 NOTE — Progress Notes (Signed)
Recreation Therapy Notes  Date: 02.06.18 Time: 1:00 pm Location: Craft Room  Group Topic: Coping Skills  Goal Area(s) Addresses:  Patient will identify emotions related to their recovery. Patient will identify coping skills to aid in their recovery.  Behavioral Response: Attentive, Interactive  Intervention: Emotion Wheel  Activity: Patients were given a worksheet with eight sections. Patients were instructed to come up with eight emotions their feel during the recovery process. Patients were instructed to write or draw coping skills to cope with each emotion inside the section.  Education: LRT educated patients on healthy coping skills.  Education Outcome: Acknowledges education/In group clarification offered   Clinical Observations/Feedback: Patient helped identify emotions towards recovery. Patient wrote coping skills to help him cope with each emotion. Patient contributed to group discussion by stating it was difficult to think of emotions, it was easy to think of coping skills, but difficult to use them, and how he can remind himself to use his coping skills.  Leonette Monarch, LRT/CTRS 12/27/2016 1:49 PM

## 2016-12-27 NOTE — Progress Notes (Signed)
Patient pleasant and cooperative with care. Med and group compliant, appropriate with staff and peers. Denies SI, HI, AVH. CBG's continue to be elevated. Insulins given accordingly. Pt continues to endorse depression. Encouragement and support offered. Safety checks maintained. Medications given as prescribed. Pt receptive and remains safe on unit with q 15 min checks.

## 2016-12-28 LAB — GLUCOSE, CAPILLARY
GLUCOSE-CAPILLARY: 241 mg/dL — AB (ref 65–99)
Glucose-Capillary: 375 mg/dL — ABNORMAL HIGH (ref 65–99)
Glucose-Capillary: 384 mg/dL — ABNORMAL HIGH (ref 65–99)

## 2016-12-28 MED ORDER — INSULIN STARTER KIT- SYRINGES (ENGLISH)
1.0000 | Freq: Once | Status: AC
  Administered 2016-12-28: 1
  Filled 2016-12-28: qty 1

## 2016-12-28 MED ORDER — INSULIN NPH ISOPHANE & REGULAR (70-30) 100 UNIT/ML ~~LOC~~ SUSP: 20.0000 [IU] | Freq: Two times a day (BID) | SUBCUTANEOUS | 0 refills | Status: DC

## 2016-12-28 MED ORDER — BLOOD GLUCOSE MONITOR KIT: PACK | 0 refills | Status: DC

## 2016-12-28 MED ORDER — INSULIN STARTER KIT- SYRINGES (ENGLISH): 1.0000 | Freq: Once | 0 refills | Status: AC

## 2016-12-28 MED ORDER — LIVING WELL WITH DIABETES BOOK
Freq: Once | Status: AC
  Administered 2016-12-28: 12:00:00
  Filled 2016-12-28 (×2): qty 1

## 2016-12-28 MED ORDER — "INSULIN SYRINGE 31G X 5/16"" 0.3 ML MISC": 1.0000 | Freq: Two times a day (BID) | 0 refills | Status: DC

## 2016-12-28 NOTE — Progress Notes (Signed)
.  D: Patient is aware of  Discharge this shift .Patient denies suicidal /homicidal ideations. Patient received all belongings brought in  A: No Storage medications. Writer reviewed Discharge Summary, Suicide Risk Assessment, and Transitional Record. Patient also received Prescriptions   from  MD. A 7 day supply of medications given to patient A: Writer instructed on discharge criteria   Aware  Of follow up appointment . R: Patient left unit with no questions  Or concerns  With family
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
## 2016-12-28 NOTE — Progress Notes (Addendum)
Inpatient Diabetes Program Recommendations  AACE/ADA: New Consensus Statement on Inpatient Glycemic Control (2015)  Target Ranges:  Prepandial:   less than 140 mg/dL      Peak postprandial:   less than 180 mg/dL (1-2 hours)      Critically ill patients:  140 - 180 mg/dL   Results for Samuel [name], Samuel [name] (MRN [id number]) as of 12/28/2016 08:12  Ref. Range 12/27/2016 07:14 12/27/2016 11:44 12/27/2016 16:23 12/27/2016 20:26 12/28/2016 06:25  Glucose-Capillary Latest Ref Range: 65 - 99 mg/dL 338 (H) 407 (H) 223 (H) 302 (H) 375 (H)   Review of Glycemic Control  Diabetes history: DM2 Outpatient Diabetes medications: Metformin 500 mg BID Current orders for Inpatient glycemic control: Metformin 1000 mg BID, Amaryl 2 mg QAM, Novolog 0-15 units TID with meals, Novolog 0-5 units QHS  Inpatient Diabetes Program Recommendations:  Insulin - Basal: Please consider ordering 70/30 15 units BID (with breakfast and supper) to start now. This dose of 70/30 will provide a total of 21 units for basal and 9 units for meal coverage per day. Oral Agents: Does not appear that oral DM medications are improving glucose at all as glucose has ranged from 223-407 mg/dl over the past 24 hours and fasting glucose is 375 mg/dl. Please consider discontinuing Amaryl and Metformin and use insulin at this time.  HgbA1C: A1C 11.3% on 12/24/2016 indicating an average glucose of 278 mg/dl over the past 2-3 months. Patient needs to follow up with PCP regarding DM control. Inpatient Consult: Please consider consulting hospitalist for assistance with inpatient glycemic control and recommendations for discharge planning for DM control as an outpatient.  NOTE: Would recommend bedside nursing start educating patient on insulin and allow patient to practice self administering insulin in case patient is discharged on insulin.  Addendum2/7/18'@12'$ :4- Talked with patient regarding DM control and insulin. Patient does not have any insurance and is unemployed at  this time. Patient plans to go to his mothers home in [place] when he is discharged today and will stay with her until he goes to [facility] on February 12th. Patient reports that he has not been taking Metformin consistently and he has been under a lot of stress over the past several months. Discussed A1C results (11.3%% on 12/24/16) and explained that his current A1C indicates an average glucose of 278 mg/dl over the past 2-3 months. Patient reports that when he was checking his glucose (has been a few months since he checked) it was usually in the low to mid 200's mg/dl. Discussed glucose and A1C goals. Discussed importance of checking CBGs and maintaining good CBG control to prevent long-term and short-term complications. Explained how hyperglycemia leads to damage within blood vessels which lead to the common complications seen with uncontrolled diabetes. Stressed to the patient the importance of improving glycemic control to prevent further complications from uncontrolled diabetes. Discussed impact of nutrition, exercise, stress, sickness, and medications on diabetes control.Provided Living Well with Diabetes booklet and encouraged patient to read entire book.  Informed patient that he will be prescribed Novolin 70/30 since it is more affordable. Informed patient that Novolin 70/30 can be purchased at [facility] for $25 per vial. Provided patient with handout information on [employer] products. Patient plans to get Novolin 70/30 [facility] and will get glucometer, testing supplies, and syringes from [facility] and [facility]) today as well.  Discussed 70/30 insulin in detail (how to take it, when to take it) and instructed patient he would  begin taking it today with supper. Asked patient to check his glucose 4 times per day (before meals and at bedtime) and to keep a log book of glucose readings and insulin taken. Discussed  hypoglycemia and hyperglycemia along with treatment. Patient will be following up with [facility] and has an appointment for this Friday (12/31/16) @ 1:30pm. Asked patient to call [facility] if he experiences any issues at all with hypoglycemia for recommendations.  Explained how the doctor he follows up with can use the log book to continue to make insulin adjustments if needed. Reviewed and demonstrated how to draw up and administer insulin with vial and syringe. Patient was able to successfully demonstrate how to draw up and administer insulin with vial and syringe. Patient reports that he use to administer insulin injections to his mother in the past.  Informed patient that RN will be asking him to self-administer insulin to ensure proper technique and ability to administer self insulin shots.  Informed patient that Dr. Jerilee Hoh will be prescribing 70/30 15 units BID (decreased from previous recommendation of 20 units BID since patient will not be inpatient to determine effect on glucose). Patient verbalized understanding of information discussed and he states that he has no further questions at this time related to diabetes.   RNs to provide ongoing basic DM education at bedside with this patient and engage patient to actively check blood glucose and administer insulin injections.   Talked with Gwynn, RN and updated on conversation and patient. Patient will need Rx for: Novolin 70/30 15 units BID, Glucometer, test strips, lancets, and insulin syringes.  Thanks, Barnie Alderman, RN, MSN, CDE Diabetes Coordinator Inpatient Diabetes Program [phone] (Team Pager from 8am to 5pm)

## 2016-12-28 NOTE — Plan of Care (Signed)
Problem: [facility] Participation in Recreation Therapeutic Interventions Goal: STG-Patient will identify at least five coping skills for ** STG: Coping Skills - Within 4 treatment sessions, patient will verbalize at least 5 coping skills for substance abuse in each of 2 treatment sessions to decrease substance abuse post d/c.  Outcome: Completed/Met Date Met: 12/28/16 Treatment Session 2; Completed 2 out of 2: At approximately 8:35 am, LRT met with patient in craft room. Patient verbalized 5 coping skills. LRT encouraged patient to use his coping skills instead of substances.  Leonette Monarch, LRT/CTRS  02.0.18 3:18 pm Goal: STG-Other Recreation Therapy Goal (Specify) STG: Stress Management - Within 4 treatment sessions, patient will verbalize understanding of the stress management techniques in each of 2 treatment sessions to increase stress management skills post d/c.  Outcome: Completed/Met Date Met: 12/28/16 Treatment Session 2; Completed 2 out of 2: At approximately 8:35 am, LRT met with patient in craft room. Patient reported he read over and practiced the stress management techniques. Patient verbalized understanding and reported the techniques were helpful. LRT encouraged patient to continue practicing the techniques.  Leonette Monarch, LRT/CTRS 02.07.18 3:22 pm

## 2016-12-28 NOTE — Plan of Care (Signed)
Problem: Activity: Goal: Sleeping patterns will improve Outcome: Progressing Patient slept for Estimated Hours of 7; every 15 minutes safety round maintained, no injury or falls during this shift.    

## 2016-12-28 NOTE — Tx Team (Addendum)
Interdisciplinary Treatment and Diagnostic Plan Update  12/28/2016 Time of Session: 10:30 AM [name] MRN: [id number]  Principal Diagnosis: Depression  Secondary Diagnoses: Principal Problem:   Unspecified depresive disorder Active Problems:   Alcohol withdrawal (HCC)   Alcohol use disorder, severe, dependence ([place])   Diabetes ([place])   Tobacco use disorder   Current Medications:  Current Facility-Administered Medications  Medication Dose Route Frequency Provider Last Rate Last Dose  . acetaminophen (TYLENOL) tablet 650 mg  650 mg Oral Q6H PRN Gonzella Lex, MD   650 mg at 12/27/16 2028  . alum & mag hydroxide-simeth (MAALOX/MYLANTA) 200-200-20 MG/5ML suspension 30 mL  30 mL Oral Q4H PRN Gonzella Lex, MD      . chlordiazePOXIDE (LIBRIUM) capsule 25 mg  25 mg Oral TID Hildred Priest, MD   25 mg at 12/28/16 4098  . citalopram (CELEXA) tablet 20 mg  20 mg Oral Daily Gonzella Lex, MD   20 mg at 12/28/16 1191  . glimepiride (AMARYL) tablet 2 mg  2 mg Oral Q breakfast Hildred Priest, MD   2 mg at 12/28/16 4782  . insulin aspart (novoLOG) injection 0-15 Units  0-15 Units Subcutaneous TID WC Hildred Priest, MD   15 Units at 12/28/16 0631  . insulin aspart (novoLOG) injection 0-5 Units  0-5 Units Subcutaneous QHS Hildred Priest, MD      . insulin starter kit- syringes (English) 1 kit  1 kit Other Once Hildred Priest, MD      . living well with diabetes book MISC   Does not apply Once Hildred Priest, MD      . magnesium hydroxide (MILK OF MAGNESIA) suspension 30 mL  30 mL Oral Daily PRN Gonzella Lex, MD      . metFORMIN (GLUCOPHAGE) tablet 1,000 mg  1,000 mg Oral BID WC Hildred Priest, MD   1,000 mg at 12/28/16 9562  . nicotine (NICODERM CQ - dosed in mg/24 hours) patch 21 mg  21 mg Transdermal Daily Gonzella Lex, MD   21 mg at 12/28/16 0815  . traZODone (DESYREL) tablet 100 mg  100 mg Oral QHS Hildred Priest, MD   100 mg at 12/27/16 2116   PTA Medications: Prescriptions Prior to Admission  Medication Sig Dispense Refill Last Dose  . acetaminophen (TYLENOL) 325 MG tablet Take 650 mg by mouth every 4 (four) hours as needed.   PRN at PRN  . ibuprofen (ADVIL,MOTRIN) 200 MG tablet Take 800 mg by mouth every 6 (six) hours as needed.   PRN at PRN  . ibuprofen (ADVIL,MOTRIN) 800 MG tablet Take 1 tablet (800 mg total) by mouth every 8 (eight) hours as needed. (Patient not taking: Reported on 12/22/2016) 30 tablet 0 Completed Course at Unknown time  . metFORMIN (GLUCOPHAGE) 500 MG tablet Take 1 tablet (500 mg total) by mouth 2 (two) times daily with a meal. 60 tablet 3 Unknown at Unknown    Patient Stressors: Financial difficulties Substance abuse  Patient Strengths: Network engineer for treatment/growth  Treatment Modalities: Medication Management, Group therapy, Case management,  1 to 1 session with clinician, Psychoeducation, Recreational therapy.   Physician Treatment Plan for Primary Diagnosis: Depression Long Term Goal(s): Improvement in symptoms so as ready for discharge Improvement in symptoms so as ready for discharge   Short Term Goals: Ability to disclose and discuss suicidal ideas Ability to maintain clinical measurements within normal limits will improve Ability to verbalize feelings will improve Ability to demonstrate self-control  will improve  Medication Management: Evaluate patient's response, side effects, and tolerance of medication regimen.  Therapeutic Interventions: 1 to 1 sessions, Unit Group sessions and Medication administration.  Evaluation of Outcomes: Progressing  Physician Treatment Plan for Secondary Diagnosis: Principal Problem:   Unspecified depresive disorder Active Problems:   Alcohol withdrawal ([place])   Alcohol use disorder, severe, dependence ([place])   Diabetes ([place])   Tobacco use disorder  Long  Term Goal(s): Improvement in symptoms so as ready for discharge Improvement in symptoms so as ready for discharge   Short Term Goals: Ability to disclose and discuss suicidal ideas Ability to maintain clinical measurements within normal limits will improve Ability to verbalize feelings will improve Ability to demonstrate self-control will improve     Medication Management: Evaluate patient's response, side effects, and tolerance of medication regimen.  Therapeutic Interventions: 1 to 1 sessions, Unit Group sessions and Medication administration.  Evaluation of Outcomes: Progressing   RN Treatment Plan for Primary Diagnosis: Depression Long Term Goal(s): Knowledge of disease and therapeutic regimen to maintain health will improve  Short Term Goals: Ability to remain free from injury will improve, Ability to demonstrate self-control, Ability to participate in decision making will improve, Ability to identify and develop effective coping behaviors will improve and Compliance with prescribed medications will improve  Medication Management: RN will administer medications as ordered by provider, will assess and evaluate patient's response and provide education to patient for prescribed medication. RN will report any adverse and/or side effects to prescribing provider.  Therapeutic Interventions: 1 on 1 counseling sessions, Psychoeducation, Medication administration, Evaluate responses to treatment, Monitor vital signs and CBGs as ordered, Perform/monitor CIWA, COWS, AIMS and Fall Risk screenings as ordered, Perform wound care treatments as ordered.  Evaluation of Outcomes: Progressing   LCSW Treatment Plan for Primary Diagnosis: Depression Long Term Goal(s): Safe transition to appropriate next level of care at discharge, Engage patient in therapeutic group addressing interpersonal concerns.  Short Term Goals: Engage patient in aftercare planning with referrals and resources, Increase social  support, Increase emotional regulation, Facilitate acceptance of mental health diagnosis and concerns and Increase skills for wellness and recovery  Therapeutic Interventions: Assess for all discharge needs, 1 to 1 time with Social worker, Explore available resources and support systems, Assess for adequacy in community support network, Educate family and significant other(s) on suicide prevention, Complete Psychosocial Assessment, Interpersonal group therapy.  Evaluation of Outcomes: Progressing    Recreational Therapy Treatment Plan for Primary Diagnosis: Depression Long Term Goal(s): Interest or engagement in recreation therapeutic interventions will improve  Short Term Goals: Decrease substance abuse, Increase stress management skills  Treatment Modalities: Group and Pet Therapy  Therapeutic Interventions: Psychoeducation  Evaluation of Outcomes: Met   Progress in Treatment: Attending groups: Yes. Participating in groups: Yes. Taking medication as prescribed: Yes. Toleration medication: Yes. Family/Significant other contact made: Yes, individual(s) contacted:  CSW contacted sister Patient understands diagnosis: Yes. Discussing patient identified problems/goals with staff: Yes. Medical problems stabilized or resolved: Yes. Denies suicidal/homicidal ideation: Yes. Issues/concerns per patient self-inventory: No.   New problem(s) identified: No, Describe:  None identified.  Discharge Plan or Barriers: [facility] intake assessment   Reason for Continuation of Hospitalization: Depression Withdrawal symptoms Other; describe alcoholism  Estimated Length of Stay: D/C 12/28/2016  Attendees: Patient: Samuel [name] 12/28/2016 11:01 AM  Physician: Dr. Merlyn Albert, MD  12/28/2016 11:01 AM  Nursing: Carolynn Sayers, RN 12/28/2016 11:01 AM  RN Care Manager: 12/28/2016 11:01 AM  Social Worker: Glorious Peach, MSW, LCSW-A  12/28/2016 11:01 AM  Recreational Therapist: Everitt Amber, LRT/CTRS   12/28/2016 11:01 AM   Scribe for Treatment Team: Emilie Rutter, [place] 12/28/2016 11:01 AM

## 2016-12-28 NOTE — Progress Notes (Signed)
Recreation Therapy Notes  Date: 02.07.18 Time: 1:00 pm Location: Craft Room  Group Topic: Self-esteem  Goal Area(s) Addresses:  Patient will be able to identify benefit of healthy self-esteem. Patient will be able to identify ways to increase self-esteem.  Behavioral Response: Did not attend  Intervention: Self-Portrait  Activity: Patients were given a blank face worksheet and were instructed to draw their self-portrait of how they were feeling. Patients were given construction paper and were instructed to write a positive trait about themselves and their peers. Patients were instructed to draw their self-portrait again, but based on how they felt after reading positive comments from peers.  Education: LRT educated patients on ways they can increase their self-esteem.  Education Outcome: Patient did not attend group.  Clinical Observations/Feedback: Patient did not attend group.   Leonette [facility], LRT/CTRS 12/28/2016 2:05 PM

## 2016-12-28 NOTE — BHH Group Notes (Signed)
[place] Group Notes:  (Nursing/MHT/Case Management/Adjunct)  Date:  12/28/2016  Time:  6:58 AM  Type of Therapy:  Psychoeducational Skills  Participation Level:  Active  Participation Quality:  Appropriate  Affect:  Appropriate  Cognitive:  Appropriate  Insight:  Appropriate  Engagement in Group:  Supportive  Modes of Intervention:  Support  Summary of Progress/Problems:  Samuel [name] 12/28/2016, 6:58 AM

## 2016-12-28 NOTE — Progress Notes (Signed)
  [facility] Adult Case Management Discharge Plan :  Will you be returning to the same living situation after discharge:  Yes,  return with sister. At discharge, do you have transportation home?: Yes,  sister will pick pt up. Do you have the ability to pay for your medications: Yes,  medication management clinic assistance program.  Release of information consent forms completed and in the chart;  Patient's signature needed at discharge.  Patient to Follow up at: Follow-up Information    [facility]. Go in 7 day(s).   Specialty:  Behavioral Health Why:  Follow-up with [facility] for outpatient services including medication management/outpatient therapy within 7 days of discharge. Walk-in hours for new patients are M-F 8a-4p.Bring photo I.D., current medications, & discharge summary to first appointment. Contact information: [address] [place] [address] [phone]        [facility]. Call on 01/02/2017.   Why:  11:45am, Follow-up with [facility] for inpatient alcohol treatment.  MUST have proof of [facility] residence. Either, an ID, or Bill, or letter from someone you are living with letting saying you live in [place].  Must also have 30 DAY of MEDS. Contact information: [address] [address] [phone]        [place]. Schedule an appointment as soon as possible for a visit on 12/28/2016.   Why:  Per receptionist, patient will need to call tomorrow to schedule prior to discharge.. I did give her the patient's demographic information Contact information: [address] [place] [address] [phone]          Next level of care provider has access to [place] and Suicide Prevention discussed: Yes,  SPE completed with sister and patient.  Have you used any form of tobacco in the last 30 days? (Cigarettes, Smokeless Tobacco, Cigars, and/or Pipes):  Yes  Has patient been referred to the Quitline?: Patient refused referral  Patient has been referred for addiction treatment: Yes  Emilie Rutter, MSW, LCSW-A 12/28/2016, 10:01 AM

## 2016-12-28 NOTE — BHH Group Notes (Signed)
[place] LCSW Group Therapy  12/28/2016 10:21 AM  Type of Therapy:  Group Therapy  Participation Level:  Active  Participation Quality:  Appropriate and Sharing  Affect:  Appropriate  Cognitive:  Appropriate  Insight:  Developing/Improving  Engagement in Therapy:  Engaged  Modes of Intervention:  Discussion, Education, Problem-solving, Reality Testing and Support  Summary of Progress/Problems: Emotional Regulation: Patients will identify both negative and positive emotions. They will discuss emotions they have difficulty regulating and how they impact their lives. Patients will be asked to identify healthy coping skills to combat unhealthy reactions to negative emotions. Patient discussed his discharge for today and how he plans to maintain his sobriety. CSW offered support to patient and discussed ways to manage stress.   Ravyn Nikkel G. [place], [place] 12/28/2016, 10:22 AM

## 2016-12-29 NOTE — Progress Notes (Signed)
Recreation Therapy Notes  INPATIENT RECREATION TR PLAN  Patient Details Name: Samuel [name] MRN: [id number] DOB: 1966-05-13 Today's Date: 12/29/2016  Rec Therapy Plan Is patient appropriate for Therapeutic Recreation?: Yes Treatment times per week: At least once a week TR Treatment/Interventions: 1:1 session, Group participation (Comment) (Appropriate participation in daily recreational therapy tx)  Discharge Criteria Pt will be discharged from therapy if:: Treatment goals are met, Discharged Treatment plan/goals/alternatives discussed and agreed upon by:: Patient/family  Discharge Summary Short term goals set: See Care Plan Short term goals met: Complete Progress toward goals comments: One-to-one attended Which groups?: Coping skills, Other (Comment) (Self-expression) One-to-one attended: Stress management, coping skills Reason goals not met: N/A Therapeutic equipment acquired: None Reason patient discharged from therapy: Discharge from hospital Pt/family agrees with progress & goals achieved: Yes Date patient discharged from therapy: 12/28/16   Leon[facility], LRT/CTRS 12/29/2016, 11:54 AM

## 2016-12-30 ENCOUNTER — Inpatient Hospital Stay: Payer: Self-pay

## 2017-01-02 ENCOUNTER — Encounter: Payer: Self-pay | Admitting: Physician Assistant

## 2017-01-02 ENCOUNTER — Encounter: Payer: Self-pay | Admitting: Licensed Clinical Social Worker

## 2017-01-02 ENCOUNTER — Ambulatory Visit: Payer: Self-pay | Attending: Internal Medicine | Admitting: Physician Assistant

## 2017-01-02 VITALS — BP 114/70 | HR 68 | Temp 98.4°F | Resp 18 | Ht 68.0 in | Wt 158.6 lb

## 2017-01-02 DIAGNOSIS — E1165 Type 2 diabetes mellitus with hyperglycemia: Secondary | ICD-10-CM | POA: Insufficient documentation

## 2017-01-02 DIAGNOSIS — Z794 Long term (current) use of insulin: Secondary | ICD-10-CM | POA: Insufficient documentation

## 2017-01-02 DIAGNOSIS — F419 Anxiety disorder, unspecified: Secondary | ICD-10-CM | POA: Insufficient documentation

## 2017-01-02 DIAGNOSIS — F329 Major depressive disorder, single episode, unspecified: Secondary | ICD-10-CM | POA: Insufficient documentation

## 2017-01-02 DIAGNOSIS — F418 Other specified anxiety disorders: Secondary | ICD-10-CM

## 2017-01-02 DIAGNOSIS — Z590 Homelessness: Secondary | ICD-10-CM | POA: Insufficient documentation

## 2017-01-02 DIAGNOSIS — Z880 Allergy status to penicillin: Secondary | ICD-10-CM | POA: Insufficient documentation

## 2017-01-02 DIAGNOSIS — Z720 Tobacco use: Secondary | ICD-10-CM

## 2017-01-02 DIAGNOSIS — F17200 Nicotine dependence, unspecified, uncomplicated: Secondary | ICD-10-CM | POA: Insufficient documentation

## 2017-01-02 DIAGNOSIS — F1010 Alcohol abuse, uncomplicated: Secondary | ICD-10-CM

## 2017-01-02 DIAGNOSIS — Z8719 Personal history of other diseases of the digestive system: Secondary | ICD-10-CM | POA: Insufficient documentation

## 2017-01-02 MED ORDER — GLUCOSE BLOOD VI STRP: ORAL_STRIP | 12 refills | Status: DC

## 2017-01-02 MED ORDER — TRAZODONE HCL 100 MG PO TABS: 100.0000 mg | ORAL_TABLET | Freq: Every day | ORAL | 1 refills | Status: DC

## 2017-01-02 MED ORDER — NICOTINE 7 MG/24HR TD PT24: 14.0000 mg | MEDICATED_PATCH | Freq: Every day | TRANSDERMAL | Status: DC

## 2017-01-02 MED ORDER — INSULIN NPH ISOPHANE & REGULAR (70-30) 100 UNIT/ML ~~LOC~~ SUSP: 20.0000 [IU] | Freq: Two times a day (BID) | SUBCUTANEOUS | 1 refills | Status: DC

## 2017-01-02 MED ORDER — TRUE METRIX METER DEVI: 1.0000 | Freq: Four times a day (QID) | 0 refills | Status: DC

## 2017-01-02 MED ORDER — TRUEPLUS LANCETS 28G MISC: 1.0000 | Freq: Four times a day (QID) | 1 refills | Status: DC

## 2017-01-02 MED ORDER — "INSULIN SYRINGE 31G X 5/16"" 0.3 ML MISC": 1.0000 | Freq: Two times a day (BID) | 1 refills | Status: DC

## 2017-01-02 MED ORDER — CITALOPRAM HYDROBROMIDE 20 MG PO TABS: 20.0000 mg | ORAL_TABLET | Freq: Every day | ORAL | 1 refills | Status: DC

## 2017-01-02 MED FILL — TRUEplus LANCETS 28G MISC: 25 days supply | Qty: 100 | Fill #0

## 2017-01-02 MED FILL — !TRUE METRIX BLOOD GLUCOSE: 1 days supply | Qty: 1 | Fill #0

## 2017-01-02 MED FILL — ?CITALOPRAM HBR 20 MG TABLE: 20 | 30 days supply | Qty: 30 | Fill #0

## 2017-01-02 MED FILL — !NOVOLIN 70/30 100 UNITS/ML: (70-30) 100 | 25 days supply | Qty: 10 | Fill #0

## 2017-01-02 MED FILL — TRUE METRIX TEST STRIP: 25 days supply | Qty: 100 | Fill #0

## 2017-01-02 MED FILL — TRUEPLUS SYR 0.3ML 31GX5/16: 31G X 5/16" | 50 days supply | Qty: 100 | Fill #0

## 2017-01-02 MED FILL — traZODone HCL 100 MG TABS: 100 | 30 days supply | Qty: 30 | Fill #0
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
## 2017-01-02 NOTE — Patient Instructions (Signed)
Congratulations! Keep up the good work!

## 2017-01-03 LAB — MICROALBUMIN / CREATININE URINE RATIO
Creatinine, Urine: 97 mg/dL (ref 20–370)
MICROALB UR: 5.9 mg/dL
Microalb Creat Ratio: 61 mcg/mg creat — ABNORMAL HIGH (ref <30)

## 2017-01-03 NOTE — BH Specialist Note (Signed)
Session Start time: 8:45 AM   End Time: 9:15 AM Total Time:  30 minutes Type of Service: [place]: No.   Interpreter Name & Language: N/A # [facility] Visits July 2017-June 2018: 1st   SUBJECTIVE: Samuel [name] is a 51 y.o. male  Pt. was referred by Erline Levine for:  anxiety, depression and excessive alcohol consumption. Pt. reports the following symptoms/concerns: overwhelming feelings of sadness and worry, difficulty sleeping, low energy/motivation, difficulty concentrating, irritability, substance use, and suicidal ideations Duration of problem:  Pt reported that he was incarcerated at 51 years old and was released at 51 years old. In 2009 he began to notice symptoms; however, it became "really bad" in 2014 Severity: severe Previous treatment: Pt was admitted at [facility] for approx a week for suicidal ideations. Pt will be admitted in a treatment program at [facility] today for approx 30 days   OBJECTIVE: Mood: Pleasant & Affect: Appropriate Risk of harm to self or others: Pt has hx of suicidal ideations. Pt denies plan or intent to self-harm and/or harm others Assessments administered: PHQ-9; GAD-7  LIFE CONTEXT:  Family & Social: Pt relocated from [place] to [place]. He currently resides with his mother and sister. Pt reports having a large family who resides nearby School/ Work: Pt is unemployed. He receives food stamps [phone]) Self-Care: Pt reports hx of substance use (alcohol) noting he was drinking a 24 pack of beer daily. Pt reports that he has been sober from alcohol for two weeks. Pt reports smoking electronic cigarettes Life changes: Pt was incarcerated from [age] years of age. He has difficulty obtaining employment and has hx of substance use. Pt is open to treatment What is important to pt/family (values): Family, Good Health, Sobriety, Independence   GOALS ADDRESSED:  Decrease symptoms of depression Decrease symptoms of anxiety Increase  knowledge of coping skills  INTERVENTIONS: Solution Focused, Strength-based and Supportive   ASSESSMENT:  Pt currently experiencing depression and anxiety triggered by substance use and financial difficulties. Pt reports overwhelming feelings of sadness and worry, difficulty sleeping, low energy/motivation, difficulty concentrating, irritability, substance use, and suicidal ideations. He currently resides with mother and sister to assist in providing emotional support in sobriety. Pt may benefit from psychoeducation, psychotherapy, and medication management. [place] educated pt on the cycle of depression and anxiety and discussed how substance use can negatively impact pt's mental and physical health. LCSWA discussed healthy coping skills that can assist in a decrease symptoms and pt identified healthy strategies to assist in mental health and sobriety. Pt will be admitted in a treatment program at [facility] today for approx 30 days. He plans to follow up with [facility] for behavioral health services. LCSWA provided pt with community resources for crisis intervention, therapy, and medication management.     PLAN: 1. F/U with behavioral health clinician: Pt was encouraged to contact LCSWA if symptoms worsen or fail to improve to schedule behavioral appointments at [facility]. 2. Behavioral Health meds: Citalopram 3. Behavioral recommendations: LCSWA recommends that pt apply healthy coping skills discussed and follow through with substance use treatment. Pt is encouraged to schedule follow up appointment with LCSWA 4. Referral: Brief Counseling/Psychotherapy, [employer], Problem-solving teaching/coping strategies, Psychoeducation and Supportive Counseling 5. From scale of 1-10, how likely are you to follow plan: 9/10   Rebekah [place], MSW, [facility]  Clinical Social Worker 01/04/17 11:18 AM  Warmhandoff:   Warm Hand Off Completed.

## 2017-01-30 ENCOUNTER — Telehealth: Payer: Self-pay | Admitting: Pharmacist

## 2017-01-30 MED ORDER — INSULIN NPH ISOPHANE & REGULAR (70-30) 100 UNIT/ML ~~LOC~~ SUSP: 20.0000 [IU] | Freq: Two times a day (BID) | SUBCUTANEOUS | 1 refills | Status: DC

## 2017-01-30 MED ORDER — GLUCOSE BLOOD VI STRP
ORAL_STRIP | 12 refills | Status: DC
Start: 2017-01-30 — End: 2020-03-20

## 2017-01-30 NOTE — Telephone Encounter (Signed)
Pt Called requesting a refill on insulin until he can get an appt. Would like meds to go to our pharmacy. Please f/u with pt. Needs test strips as well.

## 2017-01-30 NOTE — Telephone Encounter (Signed)
Refilled insulin and test strips. Patient must have office visit for further refills

## 2017-02-28 ENCOUNTER — Encounter: Payer: Self-pay | Admitting: Family Medicine

## 2017-02-28 ENCOUNTER — Ambulatory Visit: Payer: Self-pay | Attending: Family Medicine | Admitting: Family Medicine

## 2017-02-28 VITALS — BP 106/70 | HR 74 | Temp 98.4°F | Ht 68.0 in | Wt 166.6 lb

## 2017-02-28 DIAGNOSIS — F1020 Alcohol dependence, uncomplicated: Secondary | ICD-10-CM | POA: Insufficient documentation

## 2017-02-28 DIAGNOSIS — R079 Chest pain, unspecified: Secondary | ICD-10-CM | POA: Insufficient documentation

## 2017-02-28 DIAGNOSIS — S42001A Fracture of unspecified part of right clavicle, initial encounter for closed fracture: Secondary | ICD-10-CM | POA: Insufficient documentation

## 2017-02-28 DIAGNOSIS — F321 Major depressive disorder, single episode, moderate: Secondary | ICD-10-CM

## 2017-02-28 DIAGNOSIS — Z794 Long term (current) use of insulin: Secondary | ICD-10-CM | POA: Insufficient documentation

## 2017-02-28 DIAGNOSIS — F329 Major depressive disorder, single episode, unspecified: Secondary | ICD-10-CM | POA: Insufficient documentation

## 2017-02-28 DIAGNOSIS — S2241XA Multiple fractures of ribs, right side, initial encounter for closed fracture: Secondary | ICD-10-CM | POA: Insufficient documentation

## 2017-02-28 DIAGNOSIS — Z880 Allergy status to penicillin: Secondary | ICD-10-CM | POA: Insufficient documentation

## 2017-02-28 DIAGNOSIS — S42009A Fracture of unspecified part of unspecified clavicle, initial encounter for closed fracture: Secondary | ICD-10-CM | POA: Insufficient documentation

## 2017-02-28 DIAGNOSIS — X58XXXA Exposure to other specified factors, initial encounter: Secondary | ICD-10-CM | POA: Insufficient documentation

## 2017-02-28 DIAGNOSIS — E1100 Type 2 diabetes mellitus with hyperosmolarity without nonketotic hyperglycemic-hyperosmolar coma (NKHHC): Secondary | ICD-10-CM | POA: Insufficient documentation

## 2017-02-28 DIAGNOSIS — Z79899 Other long term (current) drug therapy: Secondary | ICD-10-CM | POA: Insufficient documentation

## 2017-02-28 DIAGNOSIS — S2239XA Fracture of one rib, unspecified side, initial encounter for closed fracture: Secondary | ICD-10-CM | POA: Insufficient documentation

## 2017-02-28 DIAGNOSIS — G4700 Insomnia, unspecified: Secondary | ICD-10-CM | POA: Insufficient documentation

## 2017-02-28 LAB — GLUCOSE, POCT (MANUAL RESULT ENTRY): POC GLUCOSE: 362 mg/dL — AB (ref 70–99)

## 2017-02-28 MED ORDER — NALTREXONE HCL 50 MG PO TABS: 50.0000 mg | ORAL_TABLET | Freq: Every day | ORAL | 1 refills | Status: DC

## 2017-02-28 MED ORDER — TRAZODONE HCL 100 MG PO TABS: 100.0000 mg | ORAL_TABLET | Freq: Every day | ORAL | 5 refills | Status: DC

## 2017-02-28 MED ORDER — INSULIN NPH ISOPHANE & REGULAR (70-30) 100 UNIT/ML ~~LOC~~ SUSP: 20.0000 [IU] | Freq: Two times a day (BID) | SUBCUTANEOUS | 5 refills | Status: DC

## 2017-02-28 MED ORDER — ATORVASTATIN CALCIUM 20 MG PO TABS: 20.0000 mg | ORAL_TABLET | Freq: Every day | ORAL | 5 refills | Status: DC

## 2017-02-28 MED ORDER — MELOXICAM 7.5 MG PO TABS: 7.5000 mg | ORAL_TABLET | Freq: Every day | ORAL | 1 refills | Status: DC

## 2017-02-28 MED ORDER — CITALOPRAM HYDROBROMIDE 20 MG PO TABS: 20.0000 mg | ORAL_TABLET | Freq: Every day | ORAL | 5 refills | Status: DC

## 2017-02-28 MED FILL — NALTREXONE 50 MG TABLET: 50 | 30 days supply | Qty: 30 | Fill #0

## 2017-02-28 MED FILL — ?MELOXICAM 7.5 MG TABLET: 7.5 | 30 days supply | Qty: 30 | Fill #0

## 2017-02-28 MED FILL — traZODone HCL 100 MG TABS: 100 | 30 days supply | Qty: 30 | Fill #0

## 2017-02-28 MED FILL — TRUE METRIX TEST STRIP: 30 days supply | Qty: 100 | Fill #0

## 2017-02-28 MED FILL — !NOVOLIN 70/30 100 UNITS/ML: (70-30) 100 | 28 days supply | Qty: 10 | Fill #0

## 2017-02-28 MED FILL — ?CITALOPRAM HBR 20 MG TABLE: 20 | 30 days supply | Qty: 30 | Fill #0

## 2017-02-28 MED FILL — ?ATORVASTATIN 20 MG TABLET: 20 | 30 days supply | Qty: 30 | Fill #0

## 2017-02-28 NOTE — Progress Notes (Signed)
Refills on celexa, tramadol, test strips, insulin, trazodone  Couldn't get into rehab because he is on insulin and there is no medical staff where he was going.

## 2017-02-28 NOTE — Patient Instructions (Signed)
Clavicle Fracture A clavicle fracture is a broken collarbone. The collarbone is the long bone that connects your shoulder to your chest wall. A broken collarbone may be treated with a sling or with surgery. Treatment depends on whether the broken ends of the bone are out of place or not. Follow these instructions at home: If you have a sling:   Wear the sling as told by your doctor. Take it off only as told by your doctor.  Loosen the sling if your fingers tingle, become numb, or turn cold and blue.  Do not lift your arm. Keep it across your chest.  Keep the sling clean.  Ask your doctor if you may take off the sling for bathing.  If your sling is not waterproof, do not let it get wet. Cover the sling with a watertight covering if you take a bath or a shower while wearing it.  If you may take off your sling when you take a bath or a shower, keep your shoulder in the same position as when the sling is on. Managing pain, stiffness, and swelling   If told, put ice on the injured area:  If you have a removable sling, take it off as told by your doctor.  Put ice in a plastic bag.  Place a towel between your skin and the bag.  Leave the ice on for 20 minutes, 2-3 times a day. Activity   Avoid activities that make your symptoms worse for 4-6 weeks, or as long as told.  Ask your doctor when it is safe for you to drive.  Do exercises as told by your doctor. General instructions   Do not use any products that contain nicotine or tobacco, such as cigarettes and e-cigarettes. These can delay bone healing. If you need help quitting, ask your doctor.  Take over-the-counter and prescription medicines only as told by your doctor.  Keep all follow-up visits as told by your doctor. This is important. Contact a doctor if:  Your medicine is not making you feel less pain.  Your medicine is not making swelling better. Get help right away if:  Your cannot feel your arm (your arm is  numb).  Your arm is cold.  Your arm is a lighter color than normal. Summary  A clavicle fracture is a broken collarbone. The collarbone is the long bone that connects your shoulder to your chest wall.  Treatment depends on whether the broken ends of the bone are out of place or not.  If you have a sling, wear it as told by your doctor.  Do exercises when your doctor says you can. The exercises will help your arm get strong and move like it used to. This information is not intended to replace advice given to you by your health care provider. Make sure you discuss any questions you have with your health care provider. Document Released: 04/25/2008 Document Revised: 09/26/2016 Document Reviewed: 09/26/2016 Elsevier Interactive Patient Education  2017 [employer].
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
## 2017-03-29 ENCOUNTER — Ambulatory Visit: Payer: Self-pay | Admitting: Family Medicine

## 2017-05-01 ENCOUNTER — Ambulatory Visit: Payer: Self-pay | Admitting: Family Medicine

## 2017-05-17 ENCOUNTER — Encounter: Payer: Self-pay | Admitting: Family Medicine

## 2017-05-17 ENCOUNTER — Ambulatory Visit: Payer: Self-pay | Attending: Family Medicine | Admitting: Family Medicine

## 2017-05-17 VITALS — BP 91/54 | HR 90 | Temp 98.0°F | Wt 159.8 lb

## 2017-05-17 DIAGNOSIS — F4310 Post-traumatic stress disorder, unspecified: Secondary | ICD-10-CM | POA: Insufficient documentation

## 2017-05-17 DIAGNOSIS — F329 Major depressive disorder, single episode, unspecified: Secondary | ICD-10-CM | POA: Insufficient documentation

## 2017-05-17 DIAGNOSIS — F17200 Nicotine dependence, unspecified, uncomplicated: Secondary | ICD-10-CM

## 2017-05-17 DIAGNOSIS — E1100 Type 2 diabetes mellitus with hyperosmolarity without nonketotic hyperglycemic-hyperosmolar coma (NKHHC): Secondary | ICD-10-CM | POA: Insufficient documentation

## 2017-05-17 DIAGNOSIS — F1020 Alcohol dependence, uncomplicated: Secondary | ICD-10-CM | POA: Insufficient documentation

## 2017-05-17 DIAGNOSIS — K859 Acute pancreatitis without necrosis or infection, unspecified: Secondary | ICD-10-CM | POA: Insufficient documentation

## 2017-05-17 DIAGNOSIS — Z794 Long term (current) use of insulin: Secondary | ICD-10-CM | POA: Insufficient documentation

## 2017-05-17 DIAGNOSIS — F321 Major depressive disorder, single episode, moderate: Secondary | ICD-10-CM

## 2017-05-17 DIAGNOSIS — Z880 Allergy status to penicillin: Secondary | ICD-10-CM | POA: Insufficient documentation

## 2017-05-17 DIAGNOSIS — Z720 Tobacco use: Secondary | ICD-10-CM | POA: Insufficient documentation

## 2017-05-17 DIAGNOSIS — Z9119 Patient's noncompliance with other medical treatment and regimen: Secondary | ICD-10-CM | POA: Insufficient documentation

## 2017-05-17 LAB — GLUCOSE, POCT (MANUAL RESULT ENTRY)
POC GLUCOSE: 422 mg/dL — AB (ref 70–99)
POC Glucose: 346 mg/dl — AB (ref 70–99)

## 2017-05-17 LAB — POCT GLYCOSYLATED HEMOGLOBIN (HGB A1C): HEMOGLOBIN A1C: 12.3

## 2017-05-17 MED ORDER — ATORVASTATIN CALCIUM 20 MG PO TABS: 20.0000 mg | ORAL_TABLET | Freq: Every day | ORAL | 5 refills | Status: DC

## 2017-05-17 MED ORDER — INSULIN NPH ISOPHANE & REGULAR (70-30) 100 UNIT/ML ~~LOC~~ SUSP: 28.0000 [IU] | Freq: Two times a day (BID) | SUBCUTANEOUS | 5 refills | Status: DC

## 2017-05-17 MED ORDER — CITALOPRAM HYDROBROMIDE 20 MG PO TABS: 20.0000 mg | ORAL_TABLET | Freq: Every day | ORAL | 5 refills | Status: DC

## 2017-05-17 MED ORDER — INSULIN ASPART 100 UNIT/ML ~~LOC~~ SOLN
15.0000 [IU] | Freq: Once | SUBCUTANEOUS | Status: AC
  Administered 2017-05-17: 15 [IU] via SUBCUTANEOUS

## 2017-05-17 MED FILL — ATORVASTATIN 20 MG TABLET: 20 | 30 days supply | Qty: 30 | Fill #0

## 2017-05-17 MED FILL — NOVOLIN 70/30 100 UNITS/ML: (70-30) 100 | 35 days supply | Qty: 20 | Fill #0

## 2017-05-17 MED FILL — CITALOPRAM HBR 20 MG TABLET: 20 | 30 days supply | Qty: 30 | Fill #0

## 2017-05-17 NOTE — Patient Instructions (Signed)

## 2017-05-17 NOTE — Progress Notes (Signed)
Subjective:  Patient ID: Samuel [name], male    DOB: 17-Jun-1966  Age: 51 y.o. MRN: [id number]  CC: Diabetes   HPI Samuel [name] is a 51 year old male with a history of type 2 diabetes mellitus (A1c 12.3), tobacco abuse, alcohol abuse, major depressive disorder who presents today for a follow-up visit.  He ran out of his insulin one week ago and his fasting blood sugar today is 422 and 15 units of NovoLog was administered. He has not been compliant with a diabetic diet or exercise. He informs me that his blood sugars at home have been in the 200-300 range.  Currently takes Celexa and trazodone for depression and is seen by mental health [facility]; he informs me he was diagnosed with major depressive disorder and PTSD.  He informs me he will be going to jail in the next few days as he has had several counts of DUI. He admits that he is an alcoholic but states he has been unable to seek help due to not being able to drive and not having transportation. He is unable to get established with alcohol anonymous and inpatient rehabilitation is not an option because he takes insulin for diabetes mellitus.  Past Medical History:  Diagnosis Date  . Diabetes mellitus without complication ([place])   . Pancreatitis unk    No past surgical history on file.  Allergies  Allergen Reactions  . Penicillins Hives     Outpatient Medications Prior to Visit  Medication Sig Dispense Refill  . blood glucose meter kit and supplies KIT Dispense based on patient and insurance preference. Use up to four times daily as directed. (FOR ICD-9 250.00, 250.01). 1 each 0  . Blood Glucose Monitoring Suppl (TRUE METRIX METER) DEVI 1 kit by Does not apply route QID. 1 Device 0  . glucose blood (TRUE METRIX BLOOD GLUCOSE TEST) test strip Use as instructed 100 each 12  . Insulin Syringe-Needle U-100 (INSULIN SYRINGE .3CC/31GX5/16") 31G X 5/16" 0.3 ML MISC 1 Syringe by Does not apply route 2 (two) times daily with a meal. 100  each 1  . naltrexone (DEPADE) 50 MG tablet Take 1 tablet (50 mg total) by mouth daily. 30 tablet 1  . traZODone (DESYREL) 100 MG tablet Take 1 tablet (100 mg total) by mouth at bedtime. 30 tablet 5  . TRUEPLUS LANCETS 28G MISC 1 Stick by Does not apply route 4 (four) times daily. 120 each 1  . atorvastatin (LIPITOR) 20 MG tablet Take 1 tablet (20 mg total) by mouth daily. 30 tablet 5  . citalopram (CELEXA) 20 MG tablet Take 1 tablet (20 mg total) by mouth daily. 30 tablet 5  . insulin NPH-regular Human (NOVOLIN 70/30) (70-30) 100 UNIT/ML injection Inject 20 Units into the skin 2 (two) times daily with a meal. 30 mL 5  . meloxicam (MOBIC) 7.5 MG tablet Take 1 tablet (7.5 mg total) by mouth daily. 30 tablet 1   Facility-Administered Medications Prior to Visit  Medication Dose Route Frequency Provider Last Rate Last Dose  . nicotine (NICODERM CQ - dosed in mg/24 hr) patch 14 mg  14 mg Transdermal Daily Noel, Tiffany S, PA-C        ROS Review of Systems  Constitutional: Negative for activity change and appetite change.  HENT: Negative for sinus pressure and sore throat.   Eyes: Negative for visual disturbance.  Respiratory: Negative for cough, chest tightness and shortness of breath.   Cardiovascular: Negative for chest pain and leg swelling.  Gastrointestinal: Negative  for abdominal distention, abdominal pain, constipation and diarrhea.  Endocrine: Negative.   Genitourinary: Negative for dysuria.  Musculoskeletal: Negative for joint swelling and myalgias.  Skin: Negative for rash.  Allergic/Immunologic: Negative.   Neurological: Negative for weakness, light-headedness and numbness.  Psychiatric/Behavioral: Negative for dysphoric mood and suicidal ideas.    Objective:  BP (!) 91/54   Pulse 90   Temp 98 F (36.7 C) (Oral)   Wt 159 lb 12.8 oz (72.5 kg)   SpO2 98%   BMI 24.30 kg/m   BP/Weight 05/17/2017 02/28/2017 4/66/5993  Systolic BP 91 570 177  Diastolic BP 54 70 70  Wt. (Lbs)  159.8 166.6 158.6  BMI 24.3 25.33 24.12  Some encounter information is confidential and restricted. Go to Review Flowsheets activity to see all data.      Physical Exam  Constitutional: He is oriented to person, place, and time. He appears well-developed and well-nourished.  HENT:  Right Ear: External ear normal.  Left Ear: External ear normal.  Mouth/Throat: Oropharynx is clear and moist.  Poor dental hygiene  Cardiovascular: Normal rate, normal heart sounds and intact distal pulses.   No murmur heard. Pulmonary/Chest: Effort normal and breath sounds normal. He has no wheezes. He has no rales. He exhibits no tenderness.  Abdominal: Soft. Bowel sounds are normal. He exhibits no distension and no mass. There is no tenderness.  Musculoskeletal: Normal range of motion.  Neurological: He is alert and oriented to person, place, and time.     CMP Latest Ref Rng & Units 12/22/2016 11/13/2016 09/07/2015  Glucose 65 - 99 mg/dL 334(H) 446(H) 383(H)  BUN 6 - 20 mg/dL '9 11 8  ' Creatinine 0.61 - 1.24 mg/dL 0.59(L) 0.79 0.59(L)  Sodium 135 - 145 mmol/L 132(L) 135 135  Potassium 3.5 - 5.1 mmol/L 3.8 3.5 3.8  Chloride 101 - 111 mmol/L 99(L) 100(L) 101  CO2 22 - 32 mmol/L 22 21(L) 23  Calcium 8.9 - 10.3 mg/dL 9.0 9.0 8.9  Total Protein 6.5 - 8.1 g/dL 7.4 7.6 -  Total Bilirubin 0.3 - 1.2 mg/dL 0.4 0.3 -  Alkaline Phos 38 - 126 U/L 98 63 -  AST 15 - 41 U/L 91(H) 624(H) -  ALT 17 - 63 U/L 99(H) 280(H) -    Lab Results  Component Value Date   HGBA1C 12.3 05/17/2017    Assessment & Plan:   1. Type 2 diabetes mellitus with hyperosmolarity without coma, with long-term current use of insulin (HCC) A1c of 12.3 partly due to noncompliance CBG of 422; NovoLog 15 units administered and patient observed for 45 minutes prior to repeat Increase Novolin 70/30 to 28 units twice daily Diabetic diet, lifestyle modification - POCT glucose (manual entry) - POCT glycosylated hemoglobin (Hb A1C) - insulin  NPH-regular Human (NOVOLIN 70/30) (70-30) 100 UNIT/ML injection; Inject 28 Units into the skin 2 (two) times daily with a meal.  Dispense: 30 mL; Refill: 5 - atorvastatin (LIPITOR) 20 MG tablet; Take 1 tablet (20 mg total) by mouth daily.  Dispense: 30 tablet; Refill: 5 - Lipid panel - CMP14+EGFR  2. Alcohol use disorder, severe, dependence ([place]) He will need alcohol anonymous versus inpatient rehabilitation Inpatient rehabilitation not an option at this time as he currently takes insulin Transportation is a major hindrance to his attending alcohol anonymous-I have informed him the bus transportation could be explored Once he gets some reason we will explore other options  3. Tobacco use disorder Spent 3 minutes counseling on cessation and adverse effect He  is not ready to quit at this time  4. Current moderate episode of major depressive disorder without prior episode ([place]) Continue Celexa and trazodone Keep appointment with mental health [facility] - citalopram (CELEXA) 20 MG tablet; Take 1 tablet (20 mg total) by mouth daily.  Dispense: 30 tablet; Refill: 5   Meds ordered this encounter  Medications  . insulin NPH-regular Human (NOVOLIN 70/30) (70-30) 100 UNIT/ML injection    Sig: Inject 28 Units into the skin 2 (two) times daily with a meal.    Dispense:  30 mL    Refill:  5  . citalopram (CELEXA) 20 MG tablet    Sig: Take 1 tablet (20 mg total) by mouth daily.    Dispense:  30 tablet    Refill:  5  . atorvastatin (LIPITOR) 20 MG tablet    Sig: Take 1 tablet (20 mg total) by mouth daily.    Dispense:  30 tablet    Refill:  5    Follow-up: Return in about 3 months (around 08/17/2017) for Follow-up of diabetes mellitus.   Arnoldo Morale MD

## 2017-05-18 LAB — CMP14+EGFR
ALT: 28 IU/L (ref 0–44)
AST: 21 IU/L (ref 0–40)
Albumin/Globulin Ratio: 1.5 (ref 1.2–2.2)
Albumin: 4.3 g/dL (ref 3.5–5.5)
Alkaline Phosphatase: 94 IU/L (ref 39–117)
BUN/Creatinine Ratio: 11 (ref 9–20)
BUN: 8 mg/dL (ref 6–24)
Bilirubin Total: 0.2 mg/dL (ref 0.0–1.2)
CALCIUM: 9.3 mg/dL (ref 8.7–10.2)
CO2: 21 mmol/L (ref 20–29)
CREATININE: 0.73 mg/dL — AB (ref 0.76–1.27)
Chloride: 101 mmol/L (ref 96–106)
GFR, EST AFRICAN AMERICAN: 122 mL/min/{1.73_m2} (ref >59)
GFR, EST NON AFRICAN AMERICAN: 106 mL/min/{1.73_m2} (ref >59)
GLOBULIN, TOTAL: 2.8 g/dL (ref 1.5–4.5)
Glucose: 342 mg/dL — ABNORMAL HIGH (ref 65–99)
Potassium: 4 mmol/L (ref 3.5–5.2)
SODIUM: 137 mmol/L (ref 134–144)
TOTAL PROTEIN: 7.1 g/dL (ref 6.0–8.5)

## 2017-05-18 LAB — LIPID PANEL
CHOLESTEROL TOTAL: 159 mg/dL (ref 100–199)
Chol/HDL Ratio: 3.3 ratio (ref 0.0–5.0)
HDL: 48 mg/dL (ref >39)
LDL Calculated: 95 mg/dL (ref 0–99)
TRIGLYCERIDES: 79 mg/dL (ref 0–149)
VLDL Cholesterol Cal: 16 mg/dL (ref 5–40)

## 2017-10-18 DIAGNOSIS — E1100 Type 2 diabetes mellitus with hyperosmolarity without nonketotic hyperglycemic-hyperosmolar coma (NKHHC): Secondary | ICD-10-CM

## 2018-06-24 IMAGING — DX DG FOREARM 2V*R*
2 series · 2 of 2 positions shown · non-contrast
Comparison: None.

CLINICAL DATA: Right forearm pain after moving furniture today.
Known fracture 5-6 weeks ago; patient removed cast himself.

EXAM:
RIGHT FOREARM - 2 VIEW

[forearm ap]
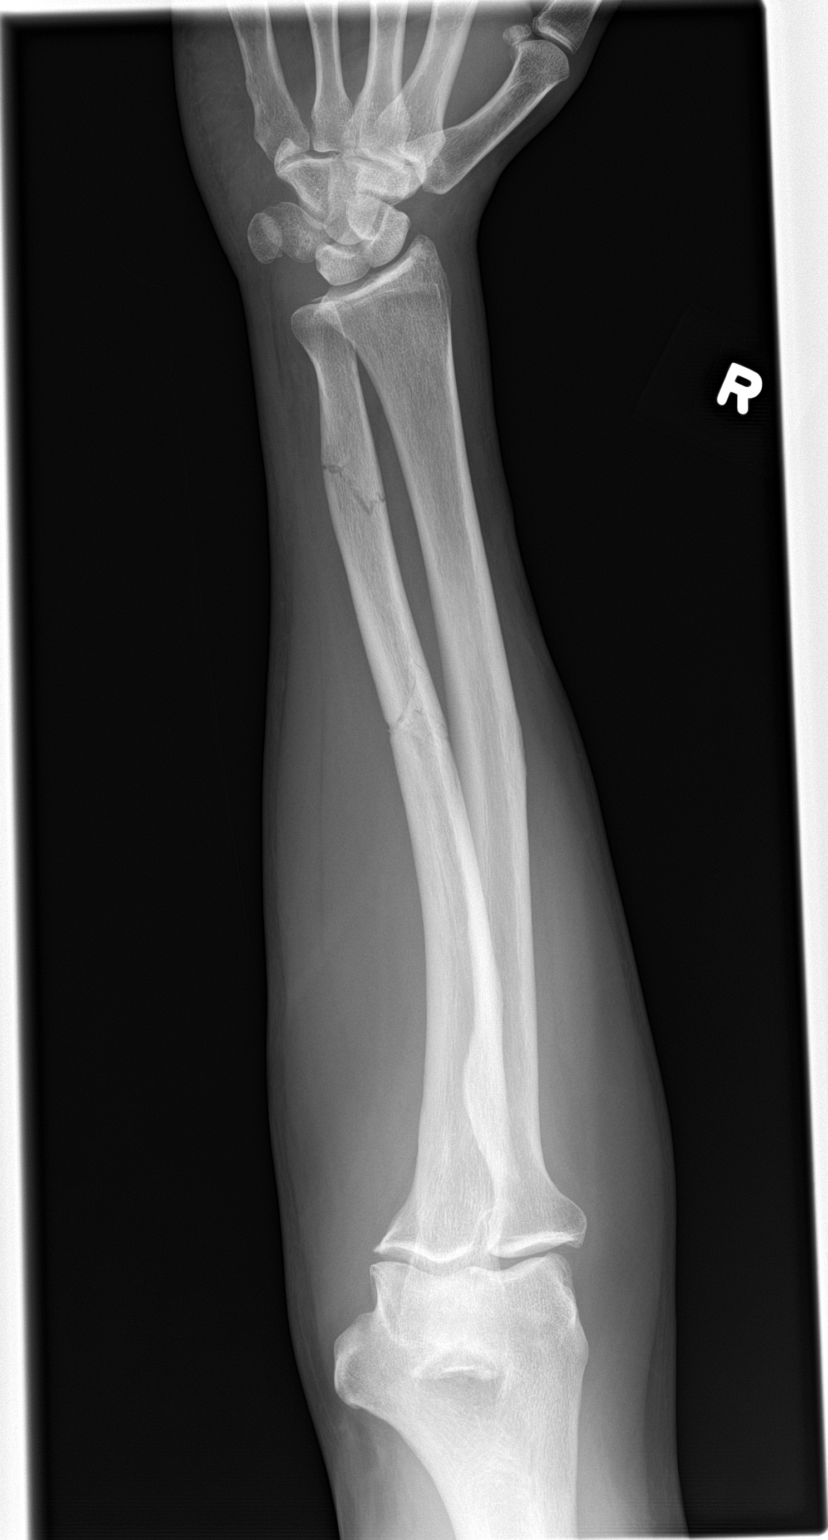

[forearm lat]
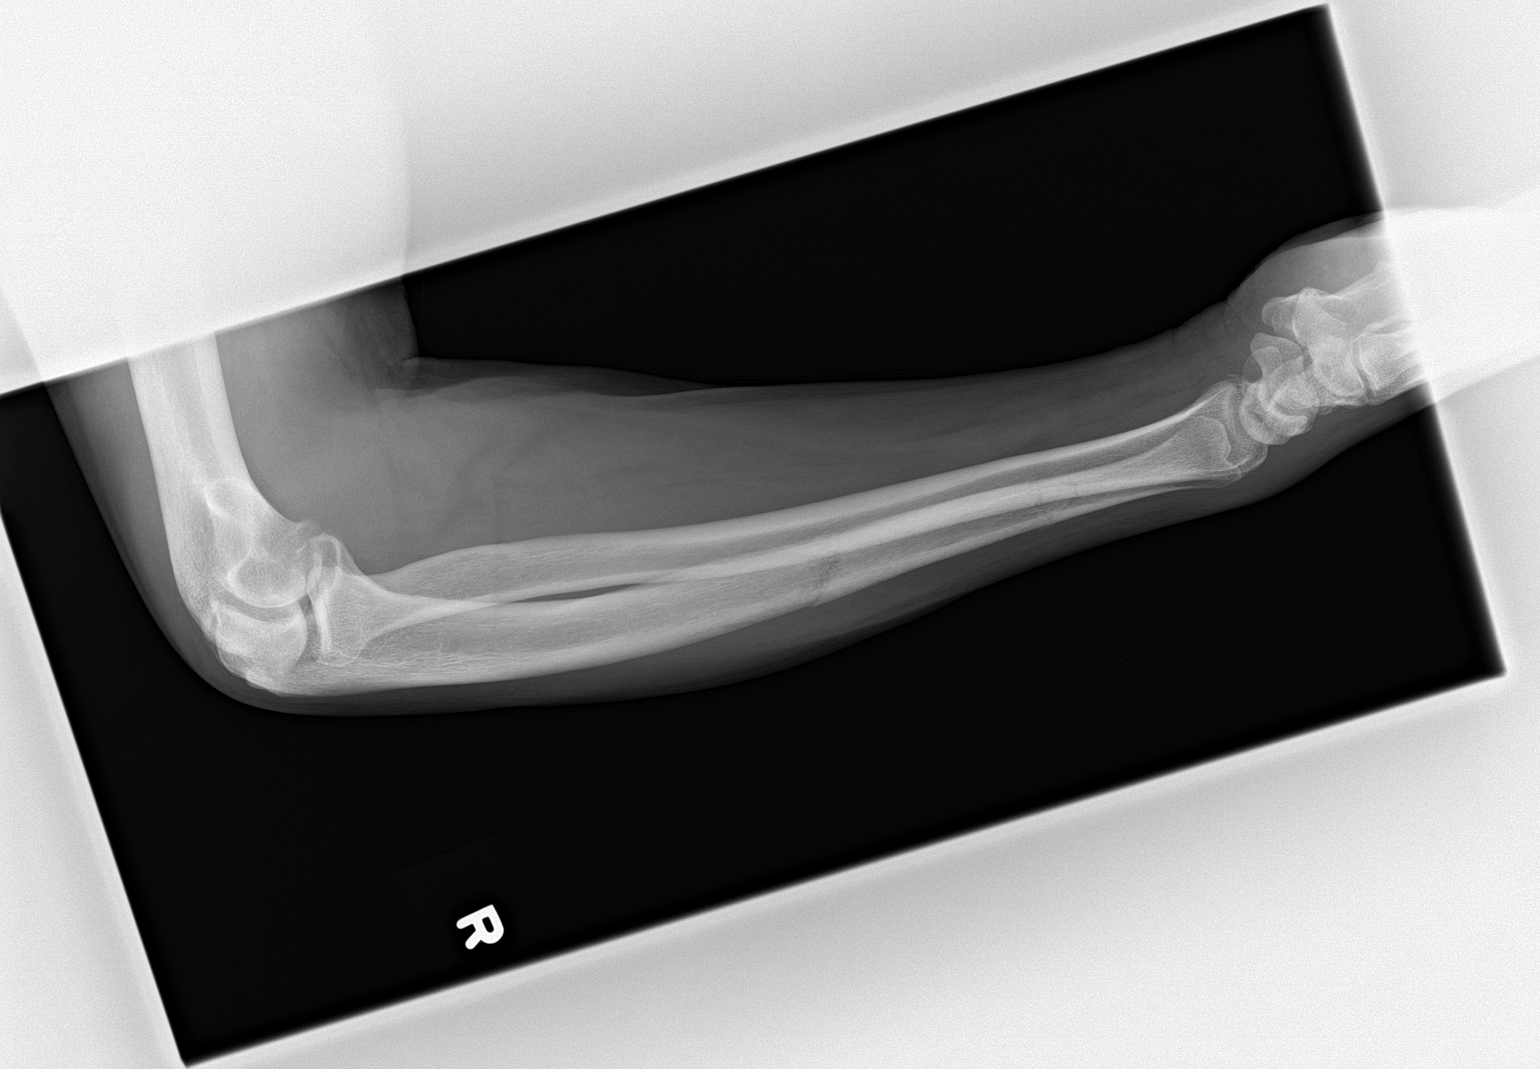

[2 of 2 positions shown; findings below may reference images not displayed]

FINDINGS: There are subacute diaphyseal fractures of the ulna at the midshaft
and distal diaphysis. These are healing in anatomic alignment. No
acute fracture is evident. No acute soft tissue abnormality. No
radiopaque foreign body.
IMPRESSION: Subacute fractures of the ulna, healing in anatomic alignment. No
acute findings.
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
## 2019-08-13 DIAGNOSIS — E1142 Type 2 diabetes mellitus with diabetic polyneuropathy: Secondary | ICD-10-CM | POA: Insufficient documentation

## 2019-08-13 DIAGNOSIS — F17200 Nicotine dependence, unspecified, uncomplicated: Secondary | ICD-10-CM | POA: Insufficient documentation

## 2019-09-10 DIAGNOSIS — R809 Proteinuria, unspecified: Secondary | ICD-10-CM | POA: Insufficient documentation

## 2019-09-10 DIAGNOSIS — E1129 Type 2 diabetes mellitus with other diabetic kidney complication: Secondary | ICD-10-CM | POA: Insufficient documentation

## 2019-11-20 DIAGNOSIS — N528 Other male erectile dysfunction: Secondary | ICD-10-CM | POA: Insufficient documentation

## 2020-03-20 ENCOUNTER — Observation Stay
Admission: EM | Admit: 2020-03-20 | Discharge: 2020-03-21 | Disposition: A | Discharge status: Home / Self Care | Payer: Medicaid Other | Attending: Family Medicine | Admitting: Family Medicine

## 2020-03-20 ENCOUNTER — Other Ambulatory Visit: Payer: Self-pay

## 2020-03-20 ENCOUNTER — Emergency Department: Payer: Medicaid Other

## 2020-03-20 DIAGNOSIS — R112 Nausea with vomiting, unspecified: Secondary | ICD-10-CM

## 2020-03-20 DIAGNOSIS — E872 Acidosis: Secondary | ICD-10-CM | POA: Diagnosis not present

## 2020-03-20 DIAGNOSIS — Z880 Allergy status to penicillin: Secondary | ICD-10-CM | POA: Diagnosis not present

## 2020-03-20 DIAGNOSIS — Z8719 Personal history of other diseases of the digestive system: Secondary | ICD-10-CM | POA: Insufficient documentation

## 2020-03-20 DIAGNOSIS — Z9114 Patient's other noncompliance with medication regimen: Secondary | ICD-10-CM | POA: Diagnosis not present

## 2020-03-20 DIAGNOSIS — F10239 Alcohol dependence with withdrawal, unspecified: Secondary | ICD-10-CM | POA: Diagnosis present

## 2020-03-20 DIAGNOSIS — F1020 Alcohol dependence, uncomplicated: Secondary | ICD-10-CM | POA: Insufficient documentation

## 2020-03-20 DIAGNOSIS — Z79899 Other long term (current) drug therapy: Secondary | ICD-10-CM | POA: Diagnosis not present

## 2020-03-20 DIAGNOSIS — E1142 Type 2 diabetes mellitus with diabetic polyneuropathy: Secondary | ICD-10-CM | POA: Insufficient documentation

## 2020-03-20 DIAGNOSIS — R932 Abnormal findings on diagnostic imaging of liver and biliary tract: Secondary | ICD-10-CM | POA: Insufficient documentation

## 2020-03-20 DIAGNOSIS — I700 Atherosclerosis of aorta: Secondary | ICD-10-CM | POA: Diagnosis not present

## 2020-03-20 DIAGNOSIS — Z20822 Contact with and (suspected) exposure to covid-19: Secondary | ICD-10-CM | POA: Insufficient documentation

## 2020-03-20 DIAGNOSIS — E119 Type 2 diabetes mellitus without complications: Secondary | ICD-10-CM

## 2020-03-20 DIAGNOSIS — R0789 Other chest pain: Secondary | ICD-10-CM | POA: Diagnosis not present

## 2020-03-20 DIAGNOSIS — F17210 Nicotine dependence, cigarettes, uncomplicated: Secondary | ICD-10-CM | POA: Insufficient documentation

## 2020-03-20 DIAGNOSIS — F329 Major depressive disorder, single episode, unspecified: Secondary | ICD-10-CM | POA: Insufficient documentation

## 2020-03-20 DIAGNOSIS — F17200 Nicotine dependence, unspecified, uncomplicated: Secondary | ICD-10-CM | POA: Diagnosis present

## 2020-03-20 DIAGNOSIS — K529 Noninfective gastroenteritis and colitis, unspecified: Secondary | ICD-10-CM | POA: Diagnosis not present

## 2020-03-20 DIAGNOSIS — K5730 Diverticulosis of large intestine without perforation or abscess without bleeding: Secondary | ICD-10-CM | POA: Diagnosis not present

## 2020-03-20 DIAGNOSIS — F10939 Alcohol use, unspecified with withdrawal, unspecified: Secondary | ICD-10-CM | POA: Diagnosis present

## 2020-03-20 DIAGNOSIS — K2980 Duodenitis without bleeding: Secondary | ICD-10-CM | POA: Diagnosis not present

## 2020-03-20 DIAGNOSIS — R1013 Epigastric pain: Secondary | ICD-10-CM

## 2020-03-20 DIAGNOSIS — Z720 Tobacco use: Secondary | ICD-10-CM

## 2020-03-20 DIAGNOSIS — K859 Acute pancreatitis without necrosis or infection, unspecified: Secondary | ICD-10-CM

## 2020-03-20 LAB — CBC
HCT: 49.1 % (ref 39.0–52.0)
HCT: 44.2 % (ref 39.0–52.0)
Hemoglobin: 16.7 g/dL (ref 13.0–17.0)
Hemoglobin: 14.9 g/dL (ref 13.0–17.0)
MCH: 32.7 pg (ref 26.0–34.0)
MCH: 33.2 pg (ref 26.0–34.0)
MCHC: 34 g/dL (ref 30.0–36.0)
MCHC: 33.7 g/dL (ref 30.0–36.0)
MCV: 96.1 fL (ref 80.0–100.0)
MCV: 98.4 fL (ref 80.0–100.0)
Platelets: 210 10*3/uL (ref 150–400)
Platelets: 189 10*3/uL (ref 150–400)
RBC: 5.11 MIL/uL (ref 4.22–5.81)
RBC: 4.49 MIL/uL (ref 4.22–5.81)
RDW: 11.9 % (ref 11.5–15.5)
RDW: 12.1 % (ref 11.5–15.5)
WBC: 7.6 10*3/uL (ref 4.0–10.5)
WBC: 7.6 10*3/uL (ref 4.0–10.5)
nRBC: 0 % (ref 0.0–0.2)
nRBC: 0 % (ref 0.0–0.2)

## 2020-03-20 LAB — COMPREHENSIVE METABOLIC PANEL
ALT: 24 U/L (ref 0–44)
AST: 20 U/L (ref 15–41)
Albumin: 4.3 g/dL (ref 3.5–5.0)
Alkaline Phosphatase: 57 U/L (ref 38–126)
Anion gap: 16 — ABNORMAL HIGH (ref 5–15)
BUN: 10 mg/dL (ref 6–20)
CO2: 22 mmol/L (ref 22–32)
Calcium: 9.5 mg/dL (ref 8.9–10.3)
Chloride: 96 mmol/L — ABNORMAL LOW (ref 98–111)
Creatinine, Ser: 0.56 mg/dL — ABNORMAL LOW (ref 0.61–1.24)
GFR calc Af Amer: >60 mL/min (ref >60)
GFR calc non Af Amer: >60 mL/min (ref >60)
Glucose, Bld: 268 mg/dL — ABNORMAL HIGH (ref 70–99)
Potassium: 4 mmol/L (ref 3.5–5.1)
Sodium: 134 mmol/L — ABNORMAL LOW (ref 135–145)
Total Bilirubin: 1.6 mg/dL — ABNORMAL HIGH (ref 0.3–1.2)
Total Protein: 8.1 g/dL (ref 6.5–8.1)

## 2020-03-20 LAB — BLOOD GAS, VENOUS
Acid-base deficit: 3.9 mmol/L — ABNORMAL HIGH (ref 0.0–2.0)
Bicarbonate: 20.8 mmol/L (ref 20.0–28.0)
O2 Saturation: 80.2 %
Patient temperature: 37
pCO2, Ven: 36 mmHg — ABNORMAL LOW (ref 44.0–60.0)
pH, Ven: 7.37 (ref 7.250–7.430)
pO2, Ven: 46 mmHg — ABNORMAL HIGH (ref 32.0–45.0)

## 2020-03-20 LAB — RESPIRATORY PANEL BY RT PCR (FLU A&B, COVID)
Influenza A by PCR: NEGATIVE
Influenza B by PCR: NEGATIVE
SARS Coronavirus 2 by RT PCR: NEGATIVE

## 2020-03-20 LAB — LIPASE, BLOOD: Lipase: 18 U/L (ref 11–51)

## 2020-03-20 LAB — CREATININE, SERUM
Creatinine, Ser: 0.46 mg/dL — ABNORMAL LOW (ref 0.61–1.24)
GFR calc Af Amer: >60 mL/min (ref >60)
GFR calc non Af Amer: >60 mL/min (ref >60)

## 2020-03-20 LAB — GLUCOSE, CAPILLARY
Glucose-Capillary: 237 mg/dL — ABNORMAL HIGH (ref 70–99)
Glucose-Capillary: 152 mg/dL — ABNORMAL HIGH (ref 70–99)
Glucose-Capillary: 178 mg/dL — ABNORMAL HIGH (ref 70–99)
Glucose-Capillary: 185 mg/dL — ABNORMAL HIGH (ref 70–99)

## 2020-03-20 LAB — URINALYSIS, COMPLETE (UACMP) WITH MICROSCOPIC
Bacteria, UA: NONE SEEN
Bilirubin Urine: NEGATIVE
Glucose, UA: >=500 mg/dL — AB
Hgb urine dipstick: NEGATIVE
Ketones, ur: 80 mg/dL — AB
Leukocytes,Ua: NEGATIVE
Nitrite: NEGATIVE
Protein, ur: 100 mg/dL — AB
Specific Gravity, Urine: 1.032 — ABNORMAL HIGH (ref 1.005–1.030)
pH: 5 (ref 5.0–8.0)

## 2020-03-20 LAB — TROPONIN I (HIGH SENSITIVITY)
Troponin I (High Sensitivity): 3 ng/L (ref <18)
Troponin I (High Sensitivity): 5 ng/L (ref <18)

## 2020-03-20 MED ORDER — SODIUM CHLORIDE 0.9 % IV BOLUS
1000.0000 mL | Freq: Once | INTRAVENOUS | Status: AC
  Administered 2020-03-20: 1000 mL via INTRAVENOUS

## 2020-03-20 MED ORDER — MORPHINE SULFATE (PF) 2 MG/ML IV SOLN
2.0000 mg | INTRAVENOUS | Status: DC | PRN
  Administered 2020-03-20: 2 mg via INTRAVENOUS
  Filled 2020-03-20: qty 1

## 2020-03-20 MED ORDER — ADULT MULTIVITAMIN W/MINERALS CH
1.0000 | ORAL_TABLET | Freq: Every day | ORAL | Status: DC
  Administered 2020-03-21: 1 via ORAL
  Filled 2020-03-20: qty 1

## 2020-03-20 MED ORDER — NICOTINE 14 MG/24HR TD PT24
14.0000 mg | MEDICATED_PATCH | Freq: Every day | TRANSDERMAL | Status: DC
  Administered 2020-03-20 – 2020-03-21 (×2): 14 mg via TRANSDERMAL
  Filled 2020-03-20 (×2): qty 1

## 2020-03-20 MED ORDER — PANTOPRAZOLE SODIUM 40 MG PO TBEC
40.0000 mg | DELAYED_RELEASE_TABLET | Freq: Two times a day (BID) | ORAL | Status: DC
  Administered 2020-03-20 – 2020-03-21 (×2): 40 mg via ORAL
  Filled 2020-03-20 (×2): qty 1

## 2020-03-20 MED ORDER — THIAMINE HCL 100 MG PO TABS
100.0000 mg | ORAL_TABLET | Freq: Every day | ORAL | Status: DC
  Administered 2020-03-20 – 2020-03-21 (×2): 100 mg via ORAL
  Filled 2020-03-20 (×2): qty 1

## 2020-03-20 MED ORDER — ONDANSETRON 4 MG PO TBDP
8.0000 mg | ORAL_TABLET | Freq: Three times a day (TID) | ORAL | Status: DC | PRN
  Administered 2020-03-20: 8 mg via ORAL
  Filled 2020-03-20: qty 2

## 2020-03-20 MED ORDER — FOLIC ACID 1 MG PO TABS
1.0000 mg | ORAL_TABLET | Freq: Every day | ORAL | Status: DC
  Administered 2020-03-21: 1 mg via ORAL
  Filled 2020-03-20: qty 1

## 2020-03-20 MED ORDER — LORAZEPAM 2 MG/ML IJ SOLN: 1.0000 mg | INTRAMUSCULAR | Status: DC | PRN

## 2020-03-20 MED ORDER — INSULIN GLARGINE 100 UNIT/ML ~~LOC~~ SOLN
10.0000 [IU] | Freq: Every day | SUBCUTANEOUS | Status: DC
  Administered 2020-03-20: 10 [IU] via SUBCUTANEOUS
  Filled 2020-03-20 (×2): qty 0.1

## 2020-03-20 MED ORDER — MORPHINE SULFATE (PF) 4 MG/ML IV SOLN
4.0000 mg | Freq: Once | INTRAVENOUS | Status: AC
  Administered 2020-03-20: 4 mg via INTRAVENOUS
  Filled 2020-03-20: qty 1

## 2020-03-20 MED ORDER — INSULIN ASPART 100 UNIT/ML ~~LOC~~ SOLN
10.0000 [IU] | Freq: Once | SUBCUTANEOUS | Status: AC
  Administered 2020-03-20: 10 [IU] via INTRAVENOUS
  Filled 2020-03-20: qty 1

## 2020-03-20 MED ORDER — INSULIN ASPART 100 UNIT/ML ~~LOC~~ SOLN
0.0000 [IU] | Freq: Three times a day (TID) | SUBCUTANEOUS | Status: DC
  Administered 2020-03-21 (×2): 5 [IU] via SUBCUTANEOUS
  Filled 2020-03-20 (×2): qty 1

## 2020-03-20 MED ORDER — GABAPENTIN 100 MG PO CAPS
100.0000 mg | ORAL_CAPSULE | Freq: Three times a day (TID) | ORAL | Status: DC
  Administered 2020-03-20 – 2020-03-21 (×2): 100 mg via ORAL
  Filled 2020-03-20 (×2): qty 1

## 2020-03-20 MED ORDER — ONDANSETRON HCL 4 MG/2ML IJ SOLN
4.0000 mg | Freq: Once | INTRAMUSCULAR | Status: AC
  Administered 2020-03-20: 4 mg via INTRAVENOUS
  Filled 2020-03-20: qty 2

## 2020-03-20 MED ORDER — LORAZEPAM 1 MG PO TABS
1.0000 mg | ORAL_TABLET | ORAL | Status: DC | PRN
  Administered 2020-03-20: 1 mg via ORAL
  Filled 2020-03-20: qty 1

## 2020-03-20 MED ORDER — ENOXAPARIN SODIUM 40 MG/0.4ML ~~LOC~~ SOLN
40.0000 mg | SUBCUTANEOUS | Status: DC
  Administered 2020-03-20: 40 mg via SUBCUTANEOUS
  Filled 2020-03-20: qty 0.4

## 2020-03-20 MED ORDER — IOHEXOL 300 MG/ML  SOLN
100.0000 mL | Freq: Once | INTRAMUSCULAR | Status: AC | PRN
  Administered 2020-03-20: 100 mL via INTRAVENOUS

## 2020-03-20 MED ORDER — SODIUM CHLORIDE 0.9 % IV SOLN: INTRAVENOUS | Status: DC

## 2020-03-20 NOTE — ED Notes (Signed)
Pt called out to let staff know he was finished using urinal.

## 2020-03-20 NOTE — ED Notes (Signed)
This RN at bedside. Pt IV leaking. This RN changed tube but IV still continued to leak. IV removed and another IV placed at this time

## 2020-03-20 NOTE — ED Provider Notes (Signed)
[facility] Emergency Department Provider Note  Time seen: 4:32 PM  I have reviewed the triage vital signs and the nursing notes.   HISTORY  Chief Complaint Abdominal Pain   HPI Samuel [name] is a 54 y.o. male with a past medical history of diabetes, alcohol use, recurrent pancreatitis, presents to the emergency department for upper abdominal pain nausea vomiting diarrhea.  According to the patient over the past 5 days or so he has been extremely nauseated with frequent episodes of vomiting.  States he can barely keep any fluids down.  Has had intermittent diarrhea.  Patient is a diabetic but has been out of his medications for the past 1 week.  States this feels like past episodes of pancreatitis per patient.  Denies any fever.  No cough.   Past Medical History:  Diagnosis Date  . Diabetes mellitus without complication ([place])   . Pancreatitis unk    Patient Active Problem List   Diagnosis Date Noted  . Clavicle fracture 02/28/2017  . Alcohol withdrawal ([place]) 12/26/2016  . Alcohol use disorder, severe, dependence ([place]) 12/26/2016  . Major depressive disorder 12/26/2016  . Diabetes ([place]) 12/26/2016  . Tobacco use disorder 12/26/2016    History reviewed. No pertinent surgical history.  Prior to Admission medications   Medication Sig Start Date End Date Taking? Authorizing Provider  atorvastatin (LIPITOR) 20 MG tablet Take 1 tablet (20 mg total) by mouth daily. 05/17/17   Charlott Rakes, MD  blood glucose meter kit and supplies KIT Dispense based on patient and insurance preference. Use up to four times daily as directed. (FOR ICD-9 250.00, 250.01). 12/28/16   Hildred Priest, MD  Blood Glucose Monitoring Suppl (TRUE METRIX METER) DEVI 1 kit by Does not apply route QID. 01/02/17   Brayton Caves, PA-C  citalopram (CELEXA) 20 MG tablet Take 1 tablet (20 mg total) by mouth daily. 05/17/17   Charlott Rakes, MD  glucose blood (TRUE METRIX BLOOD  GLUCOSE TEST) test strip Use as instructed 01/30/17   Brayton Caves, PA-C  insulin NPH-regular Human (NOVOLIN 70/30) (70-30) 100 UNIT/ML injection Inject 28 Units into the skin 2 (two) times daily with a meal. 05/17/17   Charlott Rakes, MD  Insulin Syringe-Needle U-100 (INSULIN SYRINGE .3CC/31GX5/16") 31G X 5/16" 0.3 ML MISC 1 Syringe by Does not apply route 2 (two) times daily with a meal. 01/02/17   Ena Dawley, Tiffany S, PA-C  naltrexone (DEPADE) 50 MG tablet Take 1 tablet (50 mg total) by mouth daily. 02/28/17   Charlott Rakes, MD  traZODone (DESYREL) 100 MG tablet Take 1 tablet (100 mg total) by mouth at bedtime. 02/28/17   Charlott Rakes, MD  TRUEPLUS LANCETS 28G MISC 1 Stick by Does not apply route 4 (four) times daily. 01/02/17   Brayton Caves, PA-C    Allergies  Allergen Reactions  . Penicillins Hives    No family history on file.  Social History Social History   Tobacco Use  . Smoking status: Current Every Day Smoker    Packs/day: 1.00    Types: Cigarettes  . Smokeless tobacco: Never Used  Substance Use Topics  . Alcohol use: Yes    Alcohol/week: 50.0 standard drinks    Types: 50 Cans of beer per week    Comment: last thursday - a couple of beers  . Drug use: No    Review of Systems Constitutional: Negative for fever. Cardiovascular: Negative for chest pain. Respiratory: Negative for shortness of breath. Gastrointestinal: Moderate cramping  epigastric pain.  Positive for nausea vomiting.  Occasional diarrhea. Genitourinary: Negative for urinary compaints Musculoskeletal: Negative for musculoskeletal complaints Neurological: Negative for headache All other ROS negative  ____________________________________________   PHYSICAL EXAM:  VITAL SIGNS: ED Triage Vitals  Enc Vitals Group     BP 03/20/20 1438 130/80     Pulse Rate 03/20/20 1438 88     Resp 03/20/20 1438 16     Temp 03/20/20 1438 98.3 F (36.8 C)     Temp Source 03/20/20 1438 Oral     SpO2 03/20/20 1438  99 %     Weight 03/20/20 1436 162 lb (73.5 kg)     Height 03/20/20 1436 _0  (1.727 m)     Head Circumference --      Peak Flow --      Pain Score 03/20/20 1436 9     Pain Loc --      Pain Edu? --      Excl. in [place]? --    Constitutional: Alert and oriented. Well appearing and in no distress. Eyes: Normal exam ENT      Head: Normocephalic and atraumatic.      Mouth/Throat: Mucous membranes are moist. Cardiovascular: Normal rate, regular rhythm.  Respiratory: Normal respiratory effort without tachypnea nor retractions. Breath sounds are clear  Gastrointestinal: Soft, moderate epigastric tenderness palpation without rebound guarding or distention. Musculoskeletal: Nontender with normal range of motion in all extremities.  Neurologic:  Normal speech and language. No gross focal neurologic deficits  Skin:  Skin is warm, dry and intact.  Psychiatric: Mood and affect are normal.  ____________________________________________    EKG  EKG viewed and interpreted by myself shows a sinus rhythm 87 bpm with a narrow QRS, normal axis, normal intervals, no concerning ST changes.  ____________________________________________    RADIOLOGY  IMPRESSION:  1. Fluid-filled duodenum with some mild mural thickening along the  second and third portions additional fluid-filled loops of non  thickened, nondistended small bowel. Findings are nonspecific but  can be seen in the setting of a duodenitis and/or gastroenteritis.  2. New region of linear hypoattenuation in the posterior right lobe  liver between segments VII and VIII, not evident on comparison  exams. Appearance is indeterminate on this examination with  potential etiologies including a small hepatic infarct, sequela of  prior injury or trauma, or irregular fatty infiltration. Could  consider further evaluation with MRI.  3. Colonic diverticulosis without evidence of diverticulitis.  4. Multilevel degenerative changes most pronounced at  L5-S1 with  mild retrolisthesis and facet hypertrophic changes resulting in mild  canal stenosis and moderate bilateral foraminal narrowing.  5. Aortic Atherosclerosis (ICD10-I70.0).   ____________________________________________   INITIAL IMPRESSION / ASSESSMENT AND PLAN / ED COURSE  Pertinent labs & imaging results that were available during my care of the patient were reviewed by me and considered in my medical decision making (see chart for details).   Patient presents emergency department for moderate epigastric pain along with nausea vomiting over the past several days and has been out of his insulin medication over the past 1 week.  Differential would include pancreatitis, DKA, HHS, gastroenteritis, gastritis, gallbladder pathology.  Patient is a chronic alcohol user but states he has not used alcohol in approximately 3 days due to the abdominal pain and vomiting.  States he is not been able to keep down fluids.  Patient states it feels like past episodes of pancreatitis.  Patient's lipase is normal however he states he has probably had  8-10 episodes of pancreatitis, we will obtain a CT scan to evaluate for pancreatic inflammation along with other intra-abdominal pathology.  Patient agreeable to plan of care.  Patient's labs show a blood glucose of 268 with an anion gap of 16.  Urinalysis shows ketones within the urine.  We will check a VBG, IV hydrate.  We will treat pain and nausea while awaiting results.  Patient with continued abdominal pain and nausea.  CT scan shows possible liver infarction among other possibilities of the differential.  Also shows what appears to be gastroenteritis.  Given the patient's continued abdominal pain and nausea we will discuss admission with the hospitalist team.  Jaymes Graff Luckow [name] was evaluated in Emergency Department on 03/20/2020 for the symptoms described in the history of present illness. He was evaluated in the context of the global COVID-19  pandemic, which necessitated consideration that the patient might be at risk for infection with the SARS-CoV-2 virus that causes COVID-19. Institutional protocols and algorithms that pertain to the evaluation of patients at risk for COVID-19 are in a state of rapid change based on information released by regulatory bodies including the CDC and federal and state organizations. These policies and algorithms were followed during the patient's care in the ED.  ____________________________________________   FINAL CLINICAL IMPRESSION(S) / ED DIAGNOSES  Epigastric pain Nausea vomiting   Harvest Dark, MD 03/20/20 1840

## 2020-03-20 NOTE — ED Notes (Signed)
Pt transported to CT ?

## 2020-03-20 NOTE — ED Notes (Signed)
This RN called lab to have them add on troponin to lab work sent earlier.

## 2020-03-20 NOTE — ED Triage Notes (Signed)
Reports his "pancreatitis is flaring up real bad". LUQ pain X 2 days. NVD. Upper back pain. Pt alert and oriented X4, cooperative, RR even and unlabored, color WNL. Pt in NAD.

## 2020-03-20 NOTE — ED Notes (Signed)
Pt family at bedside

## 2020-03-20 NOTE — H&P (Signed)
History and Physical    SHIGEO BAUGH II QBV:[id number] DOB: 1966/03/27 DOA: 03/20/2020  PCP: Patient, No Pcp Per  Patient coming from: home   Chief Complaint: nausea and vomiting  HPI: GENO SYDNOR II is a 54 y.o. male with medical history significant for etoh abuse, mdd, t2dm, smoking, pancreatitis, presenting with above.  Reports out of all meds for one week.  Symptoms began this Tuesday. Developed epigastric and LUQ abd pain with nausea and one episode of nbnb emesis. Last drink was shortly prior. Normally drinks daily. Cites history of alcohol withdral and says did withdraw for 2 days, with chills and shaking. Says those symptoms are resolved. However has had persistent nausea, vomited again yesterday. Has had some loose stool, none of it watery. No fevers, no sick contacts. Pain currently LUQ radiating to back. No RUQ pain. Also says since last night left chest pressure. Not exertional. Says has had rib fractures and sometimes has similar chest pain he attributes to hx of these fractures. Denies hx CAD/MI.   ED Course: 2 L fluids, morphine, labs, CT, insulin.  Review of Systems: As per HPI otherwise 10 point review of systems negative.    Past Medical History:  Diagnosis Date  . Diabetes mellitus without complication ([place])   . Pancreatitis unk    History reviewed. No pertinent surgical history.   reports that he has been smoking cigarettes. He has been smoking about 1.00 pack per day. He has never used smokeless tobacco. He reports current alcohol use of about 50.0 standard drinks of alcohol per week. He reports that he does not use drugs.  Allergies  Allergen Reactions  . Penicillins Hives    No family history on file.  Prior to Admission medications   Medication Sig Start Date End Date Taking? Authorizing Provider  atorvastatin (LIPITOR) 20 MG tablet Take 1 tablet (20 mg total) by mouth daily. 05/17/17   Charlott Rakes, MD  blood glucose meter kit and supplies  KIT Dispense based on patient and insurance preference. Use up to four times daily as directed. (FOR ICD-9 250.00, 250.01). 12/28/16   Hildred Priest, MD  Blood Glucose Monitoring Suppl (TRUE METRIX METER) DEVI 1 kit by Does not apply route QID. 01/02/17   Brayton Caves, PA-C  citalopram (CELEXA) 20 MG tablet Take 1 tablet (20 mg total) by mouth daily. 05/17/17   Charlott Rakes, MD  glucose blood (TRUE METRIX BLOOD GLUCOSE TEST) test strip Use as instructed 01/30/17   Brayton Caves, PA-C  insulin NPH-regular Human (NOVOLIN 70/30) (70-30) 100 UNIT/ML injection Inject 28 Units into the skin 2 (two) times daily with a meal. 05/17/17   Charlott Rakes, MD  Insulin Syringe-Needle U-100 (INSULIN SYRINGE .3CC/31GX5/16") 31G X 5/16" 0.3 ML MISC 1 Syringe by Does not apply route 2 (two) times daily with a meal. 01/02/17   Ena Dawley, Tiffany S, PA-C  naltrexone (DEPADE) 50 MG tablet Take 1 tablet (50 mg total) by mouth daily. 02/28/17   Charlott Rakes, MD  traZODone (DESYREL) 100 MG tablet Take 1 tablet (100 mg total) by mouth at bedtime. 02/28/17   Charlott Rakes, MD  TRUEPLUS LANCETS 28G MISC 1 Stick by Does not apply route 4 (four) times daily. 01/02/17   Brayton Caves, PA-C    Physical Exam: Vitals:   03/20/20 1438 03/20/20 1709 03/20/20 1710 03/20/20 1711  BP: 130/80   133/81  Pulse: 88 71 72 73  Resp: 16   16  Temp: 98.3 F (36.8 C)  TempSrc: Oral     SpO2: 99% 99% 99% 97%  Weight:      Height:        Constitutional: No acute distress Head: Atraumatic Eyes: Conjunctiva clear ENM: Moist mucous membranes. Normal dentition.  Neck: Supple Respiratory: Clear to auscultation bilaterally, no wheezing/rales/rhonchi. Normal respiratory effort. No accessory muscle use. . Cardiovascular: Regular rate and rhythm. No murmurs/rubs/gallops. Abdomen: mildly distended, active bowel sounds, mild ttp epigastrum and luq, no ruq tenderness, no rebound Musculoskeletal: No joint deformity upper and  lower extremities. Normal ROM, no contractures. Normal muscle tone.  Skin: No rashes, lesions, or ulcers.  Extremities: No peripheral edema. Palpable peripheral pulses. Neurologic: Alert, moving all 4 extremities. Psychiatric: Normal insight and judgement.   Labs on Admission: I have personally reviewed following labs and imaging studies  CBC: Recent Labs  Lab 03/20/20 1438  WBC 7.6  HGB 16.7  HCT 49.1  MCV 96.1  PLT 333   Basic Metabolic Panel: Recent Labs  Lab 03/20/20 1438  NA 134*  K 4.0  CL 96*  CO2 22  GLUCOSE 268*  BUN 10  CREATININE 0.56*  CALCIUM 9.5   GFR: Estimated Creatinine Clearance: 100.9 mL/min (A) (by C-G formula based on SCr of 0.56 mg/dL (L)). Liver Function Tests: Recent Labs  Lab 03/20/20 1438  AST 20  ALT 24  ALKPHOS 57  BILITOT 1.6*  PROT 8.1  ALBUMIN 4.3   Recent Labs  Lab 03/20/20 1438  LIPASE 18   No results for input(s): AMMONIA in the last 168 hours. Coagulation Profile: No results for input(s): INR, PROTIME in the last 168 hours. Cardiac Enzymes: No results for input(s): CKTOTAL, CKMB, CKMBINDEX, TROPONINI in the last 168 hours. BNP (last 3 results) No results for input(s): PROBNP in the last 8760 hours. HbA1C: No results for input(s): HGBA1C in the last 72 hours. CBG: Recent Labs  Lab 03/20/20 1719 03/20/20 1838  GLUCAP 237* 152*   Lipid Profile: No results for input(s): CHOL, HDL, LDLCALC, TRIG, CHOLHDL, LDLDIRECT in the last 72 hours. Thyroid Function Tests: No results for input(s): TSH, T4TOTAL, FREET4, T3FREE, THYROIDAB in the last 72 hours. Anemia Panel: No results for input(s): VITAMINB12, FOLATE, FERRITIN, TIBC, IRON, RETICCTPCT in the last 72 hours. Urine analysis:    Component Value Date/Time   COLORURINE YELLOW (A) 03/20/2020 1438   APPEARANCEUR CLEAR (A) 03/20/2020 1438   APPEARANCEUR Clear 10/23/2013 0801   LABSPEC 1.032 (H) 03/20/2020 1438   LABSPEC 1.037 10/23/2013 0801   PHURINE 5.0 03/20/2020  1438   GLUCOSEU >=500 (A) 03/20/2020 1438   GLUCOSEU >=500 10/23/2013 0801   HGBUR NEGATIVE 03/20/2020 1438   BILIRUBINUR NEGATIVE 03/20/2020 1438   BILIRUBINUR Negative 10/23/2013 0801   KETONESUR 80 (A) 03/20/2020 1438   PROTEINUR 100 (A) 03/20/2020 1438   NITRITE NEGATIVE 03/20/2020 1438   LEUKOCYTESUR NEGATIVE 03/20/2020 1438   LEUKOCYTESUR Negative 10/23/2013 0801    Radiological Exams on Admission: CT ABDOMEN PELVIS W CONTRAST  Result Date: 03/20/2020 CLINICAL DATA:  Epigastric and left upper quadrant pain for 2 days, nausea, vomiting and diarrhea. EXAM: CT ABDOMEN AND PELVIS WITH CONTRAST TECHNIQUE: Multidetector CT imaging of the abdomen and pelvis was performed using the standard protocol following bolus administration of intravenous contrast. CONTRAST:  115m OMNIPAQUE IOHEXOL 300 MG/ML  SOLN COMPARISON:  CT 11/13/2016 FINDINGS: Lower chest: Lung bases are clear. Normal heart size. No pericardial effusion. Hepatobiliary: New region of linear hypoattenuation in posterior right lobe liver between segments VII and VIII, extends from the  porta hepatis to the posterior surface of the liver, not evident on comparison exams. No other focal liver lesion. Smooth liver surface contour. Normal hepatic attenuation. Normal gallbladder. No visible calcified gallstones or biliary ductal dilatation. Pancreas: Mild pancreatic atrophy. No peripancreatic inflammation or ductal dilatation. Spleen: Normal in size without focal abnormality. Adrenals/Urinary Tract: Normal adrenal glands. Kidneys enhance and excrete symmetrically. No worrisome renal lesions. No urolithiasis or hydronephrosis. Urinary bladder is unremarkable. Stomach/Bowel: Distal esophagus and stomach are unremarkable. Fluid-filled duodenum with some mild mural thickening along the second and third portions. Numerous fluid-filled loops of small bowel. No frank wall thickening or distention. A normal appendix is visualized. No colonic dilatation or  wall thickening. Scattered colonic diverticula without focal pericolonic inflammation to suggest diverticulitis. No evidence of mechanical obstruction. Vascular/Lymphatic: Atherosclerotic plaque within the normal caliber aorta. No suspicious or enlarged lymph nodes in the included lymphatic chains. Reproductive: The prostate and seminal vesicles are unremarkable. Other: No abdominopelvic free fluid or free gas. No bowel containing hernias. Musculoskeletal: Multilevel degenerative changes are present in the imaged portions of the spine. Features most pronounced at L5-S1 with mild retrolisthesis and facet hypertrophic changes resulting in mild canal stenosis and moderate bilateral foraminal narrowing. IMPRESSION: 1. Fluid-filled duodenum with some mild mural thickening along the second and third portions additional fluid-filled loops of non thickened, nondistended small bowel. Findings are nonspecific but can be seen in the setting of a duodenitis and/or gastroenteritis. 2. New region of linear hypoattenuation in the posterior right lobe liver between segments VII and VIII, not evident on comparison exams. Appearance is indeterminate on this examination with potential etiologies including a small hepatic infarct, sequela of prior injury or trauma, or irregular fatty infiltration. Could consider further evaluation with MRI. 3. Colonic diverticulosis without evidence of diverticulitis. 4. Multilevel degenerative changes most pronounced at L5-S1 with mild retrolisthesis and facet hypertrophic changes resulting in mild canal stenosis and moderate bilateral foraminal narrowing. 5. Aortic Atherosclerosis (ICD10-I70.0). These results were called by telephone at the time of interpretation on 03/20/2020 at 5:11 pm to provider [facility] , who verbally acknowledged these results. Electronically Signed   By: Lovena Le M.D.   On: 03/20/2020 17:16    EKG: Independently reviewed. nsr  Assessment/Plan Active  Problems:   Alcohol withdrawal (HCC)   Alcohol use disorder, severe, dependence (HCC)   Major depressive disorder   Diabetes ([place])   Tobacco use disorder   Nausea and vomiting   Pancreatitis   # Nausea and vomiting:  Pt does report symptoms are improving and is tolerating fluids. Kidney function and potassium are wnl. As out of all meds and glucose elevated on presentation (268), developing dka is posible - gap was 16 and ketones in urine, though ph wnl. To that end has received 2 units NS and most recent glucose is 152. Withdrawal from alcohol may have contributed, but symptoms began very shortly after last drink so unlikely the precipitating cause. Pt has hx chronic pancreatitis; here lipase wnl and no signs pancreatitis on exam, but a mild case of pancreatitis is possible. CT shows signs duodenitis/enteritis, suggesting possible infectious cause; if so suspect viral and should resolve w/ supportive care. CT shows ill-defined lesion in the liver that could represent thrombus, but were that the case would suspect pt to have ruq pain and/or tenderness, which he does not. tbili is mildly elevated to 1.6; lfts are o/w wnl. Plan for now will be supportive, with monitoring. Denies melena/hematochezia - cont NS @ 150 ml/hr -  zofran prn for nausea, morphine prn for pain - repeat cmp in morning - increase home protonix to 40 bid - consider hepatic MRI w/ contrast clinical picture begins to point more toward hepatic of if symptoms persist - f/u hiv, hcv - f/u h pylori serology (ordered given ct findings of duodenitis)  # Left chest pain - atypical (non-exertional, at rest). Risk factors for acs present. ECG non-ischemic. Think unlikely ACS. - troponin x2, monitor symptoms closely  # New liver finding - "indeterminite" hypoattenuating linear region of liver on ct, ddx includes infarct. As above, given absence of ruq symptoms, for now presuming this to be incidental, pt does have hx multiple prior traumas  and sequelae of prior injury is in ddx  # Alcohol dependence - hx uncomplicated withdrawal in the past. Last drink over 72 hours ago. Pt reports 2 days of withdrawal symptoms, resolved since yesterday. On exam does not appear to be withdrawing. - monitor closely for s/s withdrawal - thiamine ordered - outpatient substance abuse tx  # T2DM w/ peripheral neuropathy - 1 wk w/o meds, poss partial dka on presentation as above. - hold home metformin, jardiance, and victoza - start lantus 10 units qhs and ssi - fluids as above - cont home gabapentin - hold home atorvastatin, aspirin, lisinopril  # tobacco abuse - patch ordered  DVT prophylaxis: lovenox Code Status: full  Family Communication: sister sydney, updated @ bedside  Disposition Plan: tbd  Consults called: none  Admission status: obs med/surg    Desma Maxim MD [facility] Pager [phone]  If 7PM-7AM, please contact night-coverage [email or web address] Password [facility]  03/20/2020, 7:21 PM

## 2020-03-21 DIAGNOSIS — K529 Noninfective gastroenteritis and colitis, unspecified: Secondary | ICD-10-CM

## 2020-03-21 DIAGNOSIS — E119 Type 2 diabetes mellitus without complications: Secondary | ICD-10-CM | POA: Diagnosis not present

## 2020-03-21 DIAGNOSIS — R112 Nausea with vomiting, unspecified: Secondary | ICD-10-CM

## 2020-03-21 DIAGNOSIS — E872 Acidosis: Secondary | ICD-10-CM | POA: Diagnosis not present

## 2020-03-21 LAB — COMPREHENSIVE METABOLIC PANEL WITH GFR
ALT: 16 U/L (ref 0–44)
AST: 15 U/L (ref 15–41)
Albumin: 3.3 g/dL — ABNORMAL LOW (ref 3.5–5.0)
Alkaline Phosphatase: 40 U/L (ref 38–126)
Anion gap: 11 (ref 5–15)
BUN: 8 mg/dL (ref 6–20)
CO2: 19 mmol/L — ABNORMAL LOW (ref 22–32)
Calcium: 8.1 mg/dL — ABNORMAL LOW (ref 8.9–10.3)
Chloride: 103 mmol/L (ref 98–111)
Creatinine, Ser: 0.61 mg/dL (ref 0.61–1.24)
GFR calc Af Amer: >60 mL/min (ref >60)
GFR calc non Af Amer: >60 mL/min (ref >60)
Glucose, Bld: 245 mg/dL — ABNORMAL HIGH (ref 70–99)
Potassium: 3.9 mmol/L (ref 3.5–5.1)
Sodium: 133 mmol/L — ABNORMAL LOW (ref 135–145)
Total Bilirubin: 1.2 mg/dL (ref 0.3–1.2)
Total Protein: 6 g/dL — ABNORMAL LOW (ref 6.5–8.1)

## 2020-03-21 LAB — HIV ANTIBODY (ROUTINE TESTING W REFLEX): HIV Screen 4th Generation wRfx: NONREACTIVE

## 2020-03-21 LAB — GLUCOSE, CAPILLARY
Glucose-Capillary: 225 mg/dL — ABNORMAL HIGH (ref 70–99)
Glucose-Capillary: 205 mg/dL — ABNORMAL HIGH (ref 70–99)

## 2020-03-21 LAB — HEMOGLOBIN A1C
Hgb A1c MFr Bld: 10.2 % — ABNORMAL HIGH (ref 4.8–5.6)
Mean Plasma Glucose: 246.04 mg/dL

## 2020-03-21 MED ORDER — ADULT MULTIVITAMIN W/MINERALS CH: 1.0000 | ORAL_TABLET | Freq: Every day | ORAL | Status: DC

## 2020-03-21 MED ORDER — THIAMINE HCL 100 MG PO TABS: 100.0000 mg | ORAL_TABLET | Freq: Every day | ORAL | Status: DC

## 2020-03-21 MED ORDER — FOLIC ACID 1 MG PO TABS: 1.0000 mg | ORAL_TABLET | Freq: Every day | ORAL | Status: DC

## 2020-03-21 NOTE — Plan of Care (Signed)
Continuing with plan of care. 
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
## 2020-03-21 NOTE — Plan of Care (Signed)
Discharge teaching completed with patient who verbalized understanding of teaching.  Patient is in stable condition and has all belongings.

## 2020-03-23 LAB — H PYLORI, IGM, IGG, IGA AB
H Pylori IgG: 0.27 Index Value (ref 0.00–0.79)
H. Pylogi, Iga Abs: 15.6 units — ABNORMAL HIGH (ref 0.0–8.9)
H. Pylogi, Igm Abs: <9 units (ref 0.0–8.9)

## 2020-03-25 LAB — HCV INTERPRETATION

## 2020-03-25 LAB — HCV AB W REFLEX TO QUANT PCR: HCV Ab: <0.1 s/co ratio (ref 0.0–0.9)

## 2020-12-21 ENCOUNTER — Telehealth: Payer: Self-pay | Admitting: Licensed Clinical Social Worker

## 2020-12-21 NOTE — Telephone Encounter (Signed)
LCSW received a voice message from patient. Return call placed to patient. Patient is interested in establishing primary care. States that he is a diabetic and is currently out of all his medications. Surveyor, quantity at [employer] at [facility] was informed and agreed to contact pt to schedule earliest appointment available. Pt agreed to contact LCSW with additional resource needs after meeting with PCP. No additional concerns noted.

## 2020-12-24 ENCOUNTER — Encounter: Payer: Self-pay | Admitting: Physician Assistant

## 2020-12-24 ENCOUNTER — Ambulatory Visit (INDEPENDENT_AMBULATORY_CARE_PROVIDER_SITE_OTHER): Payer: Medicaid Other | Admitting: Licensed Clinical Social Worker

## 2020-12-24 ENCOUNTER — Other Ambulatory Visit: Payer: Self-pay

## 2020-12-24 ENCOUNTER — Other Ambulatory Visit: Payer: Self-pay | Admitting: Physician Assistant

## 2020-12-24 ENCOUNTER — Ambulatory Visit (INDEPENDENT_AMBULATORY_CARE_PROVIDER_SITE_OTHER): Payer: Medicaid Other | Admitting: Physician Assistant

## 2020-12-24 VITALS — BP 131/82 | HR 79 | Temp 97.3°F | Resp 18 | Ht 68.0 in | Wt 146.0 lb

## 2020-12-24 DIAGNOSIS — Z794 Long term (current) use of insulin: Secondary | ICD-10-CM

## 2020-12-24 DIAGNOSIS — E559 Vitamin D deficiency, unspecified: Secondary | ICD-10-CM

## 2020-12-24 DIAGNOSIS — E1169 Type 2 diabetes mellitus with other specified complication: Secondary | ICD-10-CM

## 2020-12-24 DIAGNOSIS — K219 Gastro-esophageal reflux disease without esophagitis: Secondary | ICD-10-CM

## 2020-12-24 DIAGNOSIS — G629 Polyneuropathy, unspecified: Secondary | ICD-10-CM

## 2020-12-24 DIAGNOSIS — F332 Major depressive disorder, recurrent severe without psychotic features: Secondary | ICD-10-CM

## 2020-12-24 DIAGNOSIS — F331 Major depressive disorder, recurrent, moderate: Secondary | ICD-10-CM | POA: Diagnosis not present

## 2020-12-24 DIAGNOSIS — F411 Generalized anxiety disorder: Secondary | ICD-10-CM

## 2020-12-24 DIAGNOSIS — F5104 Psychophysiologic insomnia: Secondary | ICD-10-CM

## 2020-12-24 DIAGNOSIS — E785 Hyperlipidemia, unspecified: Secondary | ICD-10-CM

## 2020-12-24 DIAGNOSIS — Z125 Encounter for screening for malignant neoplasm of prostate: Secondary | ICD-10-CM

## 2020-12-24 LAB — POCT GLYCOSYLATED HEMOGLOBIN (HGB A1C): Hemoglobin A1C: 13.1 % — AB (ref 4.0–5.6)

## 2020-12-24 MED ORDER — ASPIRIN EC 81 MG PO TBEC: 81.0000 mg | DELAYED_RELEASE_TABLET | Freq: Every day | ORAL | 2 refills | Status: DC

## 2020-12-24 MED ORDER — ACCU-CHEK AVIVA PLUS VI STRP: ORAL_STRIP | 12 refills | Status: DC

## 2020-12-24 MED ORDER — METFORMIN HCL 1000 MG PO TABS: 1000.0000 mg | ORAL_TABLET | Freq: Two times a day (BID) | ORAL | 2 refills | Status: DC

## 2020-12-24 MED ORDER — PANTOPRAZOLE SODIUM 40 MG PO TBEC: 40.0000 mg | DELAYED_RELEASE_TABLET | Freq: Every day | ORAL | 2 refills | Status: DC

## 2020-12-24 MED ORDER — TRAZODONE HCL 50 MG PO TABS: 50.0000 mg | ORAL_TABLET | Freq: Every day | ORAL | 2 refills | Status: DC

## 2020-12-24 MED ORDER — TRUEPLUS 5-BEVEL PEN NEEDLES 32G X 4 MM MISC
1.0000 | Freq: Two times a day (BID) | 11 refills | Status: DC
Start: 2020-12-24 — End: 2021-01-21

## 2020-12-24 MED ORDER — ATORVASTATIN CALCIUM 40 MG PO TABS
40.0000 mg | ORAL_TABLET | Freq: Every day | ORAL | 2 refills | Status: DC
Start: 2020-12-24 — End: 2021-03-10

## 2020-12-24 MED ORDER — GABAPENTIN 100 MG PO CAPS: 100.0000 mg | ORAL_CAPSULE | Freq: Every day | ORAL | 2 refills | Status: DC

## 2020-12-24 MED ORDER — ACCU-CHEK SOFTCLIX LANCETS MISC
12 refills | Status: DC
Start: 2020-12-24 — End: 2021-01-21

## 2020-12-24 MED ORDER — ACCU-CHEK AVIVA PLUS W/DEVICE KIT: 1.0000 [IU] | PACK | Freq: Once | 0 refills | Status: AC

## 2020-12-24 MED ORDER — HYDROXYZINE HCL 25 MG PO TABS: 25.0000 mg | ORAL_TABLET | Freq: Four times a day (QID) | ORAL | 2 refills | Status: DC | PRN

## 2020-12-24 MED ORDER — SERTRALINE HCL 50 MG PO TABS
50.0000 mg | ORAL_TABLET | Freq: Every day | ORAL | 2 refills | Status: DC
Start: 2020-12-24 — End: 2021-03-10

## 2020-12-24 MED ORDER — INSULIN NPH ISOPHANE & REGULAR (70-30) 100 UNIT/ML ~~LOC~~ SUSP: 20.0000 [IU] | Freq: Two times a day (BID) | SUBCUTANEOUS | 2 refills | Status: DC

## 2020-12-24 MED FILL — TRUEplus LANCETS 28G MISC: 30 days supply | Qty: 100 | Fill #0

## 2020-12-24 MED FILL — GABAPENTIN 100 MG CAPSULE: 100 | 30 days supply | Qty: 30 | Fill #0

## 2020-12-24 MED FILL — TRUE METRIX GLUCOSE TEST ST: 30 days supply | Qty: 100 | Fill #0

## 2020-12-24 MED FILL — !TRUE METRIX BLOOD GLUCOSE: 1 days supply | Qty: 1 | Fill #0

## 2020-12-24 MED FILL — METFORMIN HCL 1000 MG TABS: 1000 | 30 days supply | Qty: 60 | Fill #0

## 2020-12-24 MED FILL — ?SERTRALINE HCL 50 MG TABS: 50 | 30 days supply | Qty: 30 | Fill #0

## 2020-12-24 MED FILL — ?TRAZODONE HCL 50 TABS: 50 | 30 days supply | Qty: 30 | Fill #0

## 2020-12-24 MED FILL — ATORVASTATIN CALCIUM 40 MG: 40 | 30 days supply | Qty: 30 | Fill #0

## 2020-12-24 MED FILL — hydrOXYzine HCL 25 MG TABS: 25 | 15 days supply | Qty: 60 | Fill #0

## 2020-12-24 MED FILL — TRUEPLUS PEN NDL 32GX5/32: 32G X 4 MM | 50 days supply | Qty: 100 | Fill #0

## 2020-12-24 MED FILL — PANTOPRAZOLE SOD DR 40 MG T: 40 | 30 days supply | Qty: 30 | Fill #0

## 2020-12-24 MED FILL — ?HUMULIN 70/30 VIAL: (70-30) 100 | 30 days supply | Qty: 10 | Fill #0

## 2020-12-24 NOTE — Progress Notes (Signed)
Patient presents for restart of DM medications Patient denies pain at this time. Patient has not eaten today.

## 2020-12-24 NOTE — Patient Instructions (Signed)
I sent your medications for you to restart them.  Please check your blood glucose levels at least twice a day, keep a written log and have that available when you follow-up with the clinic pharmacist in 1 month.  I did restart your gabapentin, we will start with 100 mg at bedtime, as your blood glucose levels become better under control the neuropathy should improve.  Please let us know if there is anything else we can do for you, we will call you with your lab results.   Kennieth Rad, PA-C Physician Assistant [facility] [email or web address]    Neuropathic Pain Neuropathic pain is pain caused by damage to the nerves that are responsible for certain sensations in your body (sensory nerves). The pain can be caused by:  Damage to the sensory nerves that send signals to your spinal cord and brain (peripheral nervous system).  Damage to the sensory nerves in your brain or spinal cord (central nervous system). Neuropathic pain can make you more sensitive to pain. Even a minor sensation can feel very painful. This is usually a long-term condition that can be difficult to treat. The type of pain differs from person to person. It may:  Start suddenly (acute), or it may develop slowly and last for a long time (chronic).  Come and go as damaged nerves heal, or it may stay at the same level for years.  Cause emotional distress, loss of sleep, and a lower quality of life. What are the causes? The most common cause of this condition is diabetes. Many other diseases and conditions can also cause neuropathic pain. Causes of neuropathic pain can be classified as:  Toxic. This is caused by medicines and chemicals. The most common cause of toxic neuropathic pain is damage from cancer treatments (chemotherapy).  Metabolic. This can be caused by: ? Diabetes. This is the most common disease that damages the nerves. ? Lack of vitamin B from  long-term alcohol abuse.  Traumatic. Any injury that cuts, crushes, or stretches a nerve can cause damage and pain. A common example is feeling pain after losing an arm or leg (phantom limb pain).  Compression-related. If a sensory nerve gets trapped or compressed for a long period of time, the blood supply to the nerve can be cut off.  Vascular. Many blood vessel diseases can cause neuropathic pain by decreasing blood supply and oxygen to nerves.  Autoimmune. This type of pain results from diseases in which the body's defense system (immune system) mistakenly attacks sensory nerves. Examples of autoimmune diseases that can cause neuropathic pain include lupus and multiple sclerosis.  Infectious. Many types of viral infections can damage sensory nerves and cause pain. Shingles infection is a common cause of this type of pain.  Inherited. Neuropathic pain can be a symptom of many diseases that are passed down through families (genetic). What increases the risk? You are more likely to develop this condition if:  You have diabetes.  You smoke.  You drink too much alcohol.  You are taking certain medicines, including medicines that kill cancer cells (chemotherapy) or that treat immune system disorders. What are the signs or symptoms? The main symptom is pain. Neuropathic pain is often described as:  Burning.  Shock-like.  Stinging.  Hot or cold.  Itching. How is this diagnosed? No single test can diagnose neuropathic pain. It is diagnosed based on:  Physical exam and your symptoms. Your health care provider will ask you about your pain. You may be asked  to use a pain scale to describe how bad your pain is.  Tests. These may be done to see if you have a high sensitivity to pain and to help find the cause and location of any sensory nerve damage. They include: ? Nerve conduction studies to test how well nerve signals travel through your sensory nerves (electrodiagnostic  testing). ? Stimulating your sensory nerves through electrodes on your skin and measuring the response in your spinal cord and brain (somatosensory evoked potential).  Imaging studies, such as: ? X-rays. ? CT scan. ? MRI. How is this treated? Treatment for neuropathic pain may change over time. You may need to try different treatment options or a combination of treatments. Some options include:  Treating the underlying cause of the neuropathy, such as diabetes, kidney disease, or vitamin deficiencies.  Stopping medicines that can cause neuropathy, such as chemotherapy.  Medicine to relieve pain. Medicines may include: ? Prescription or over-the-counter pain medicine. ? Anti-seizure medicine. ? Antidepressant medicines. ? Pain-relieving patches that are applied to painful areas of skin. ? A medicine to numb the area (local anesthetic), which can be injected as a nerve block.  Transcutaneous nerve stimulation. This uses electrical currents to block painful nerve signals. The treatment is painless.  Alternative treatments, such as: ? Acupuncture. ? Meditation. ? Massage. ? Physical therapy. ? Pain management programs. ? Counseling. Follow these instructions at home: Medicines  Take over-the-counter and prescription medicines only as told by your health care provider.  Do not drive or use heavy machinery while taking prescription pain medicine.  If you are taking prescription pain medicine, take actions to prevent or treat constipation. Your health care provider may recommend that you: ? Drink enough fluid to keep your urine pale yellow. ? Eat foods that are high in fiber, such as fresh fruits and vegetables, whole grains, and beans. ? Limit foods that are high in fat and processed sugars, such as fried or sweet foods. ? Take an over-the-counter or prescription medicine for constipation.   Lifestyle  Have a good support system at home.  Consider joining a chronic pain  support group.  Do not use any products that contain nicotine or tobacco, such as cigarettes and e-cigarettes. If you need help quitting, ask your health care provider.  Do not drink alcohol.   General instructions  Learn as much as you can about your condition.  Work closely with all your health care providers to find the treatment plan that works best for you.  Ask your health care provider what activities are safe for you.  Keep all follow-up visits as told by your health care provider. This is important. Contact a health care provider if:  Your pain treatments are not working.  You are having side effects from your medicines.  You are struggling with tiredness (fatigue), mood changes, depression, or anxiety. Summary  Neuropathic pain is pain caused by damage to the nerves that are responsible for certain sensations in your body (sensory nerves).  Neuropathic pain may come and go as damaged nerves heal, or it may stay at the same level for years.  Neuropathic pain is usually a long-term condition that can be difficult to treat. Consider joining a chronic pain support group. This information is not intended to replace advice given to you by your health care provider. Make sure you discuss any questions you have with your health care provider. Document Revised: 02/28/2019 Document Reviewed: 11/24/2017 Elsevier Patient Education  2021 [employer].

## 2020-12-24 NOTE — Progress Notes (Signed)
New Patient Office Visit  Subjective:  Patient ID: Samuel [name], male    DOB: [birth year]  Age: 55 y.o. MRN: [id number]  CC:  Chief Complaint  Patient presents with  . Diabetes    HPI Samuel [name] reports that he would like to get restarted on his medications.  Reports that he has been treated for diabetes, states that he was hospitalized in [place] in September 2021 and his medications were changed to insulin injections twice a day and Metformin.  Reports that he ran out of his medications in November and has not been taking anything since then.  Does endorse that he has been feeling poorly, increased thirst, increased urination.  Reports that he was previously treated for depression and insomnia.  Requests refills of those medications as well.    Reports previous treatment for neuropathy, states that he was taking 100 mg 3 times a day, states when he was in substance abuse treatment, they would not allow him to take the medication.  Reports that he has neuropathy in both feet.  His chart does show a history of H. pylori, states that he does not remember if he was treated for this, states that was when he was drinking heavily, also had episodes of pancreatitis.  Past Medical History:  Diagnosis Date  . Diabetes mellitus without complication ([place])   . Pancreatitis unk    History reviewed. No pertinent surgical history.  History reviewed. No pertinent family history.  Social History   Socioeconomic History  . Marital status: Single    Spouse name: Not on file  . Number of children: Not on file  . Years of education: Not on file  . Highest education level: Not on file  Occupational History  . Not on file  Tobacco Use  . Smoking status: Current Every Day Smoker    Packs/day: 1.00    Types: Cigarettes  . Smokeless tobacco: Never Used  Substance and Sexual Activity  . Alcohol use: Yes    Alcohol/week: 50.0 standard drinks    Types: 50 Cans of beer per  week    Comment: last thursday - a couple of beers  . Drug use: No  . Sexual activity: Not on file  Other Topics Concern  . Not on file  Social History Narrative  . Not on file   Social Determinants of Health   Financial Resource Strain: Not on file  Food Insecurity: Not on file  Transportation Needs: Not on file  Physical Activity: Not on file  Stress: Not on file  Social Connections: Not on file  Intimate Partner Violence: Not on file    ROS Review of Systems  Constitutional: Negative for chills and fever.  HENT: Negative.   Eyes: Negative.   Respiratory: Negative for shortness of breath.   Cardiovascular: Negative for chest pain.  Gastrointestinal: Negative for abdominal pain, nausea and vomiting.  Endocrine: Positive for polydipsia and polyuria.  Genitourinary: Negative for dysuria and hematuria.  Musculoskeletal: Negative.   Allergic/Immunologic: Negative.   Neurological: Negative.   Hematological: Negative.   Psychiatric/Behavioral: Positive for dysphoric mood and sleep disturbance. Negative for self-injury and suicidal ideas. The patient is nervous/anxious.     Objective:   Today's Vitals: BP 131/82 (BP Location: Left Arm, Patient Position: Sitting, Cuff Size: Normal)   Pulse 79   Temp (!) 97.3 F (36.3 C) (Oral)   Resp 18   Ht '5\' 8"'  (1.727 m)   Wt 146 lb (66.2 kg)  SpO2 97%   BMI 22.20 kg/m   Physical Exam Vitals and nursing note reviewed.  Constitutional:      General: He is not in acute distress.    Appearance: Normal appearance. He is not ill-appearing.  HENT:     Head: Normocephalic and atraumatic.     Right Ear: External ear normal.     Left Ear: External ear normal.     Nose: Nose normal.     Mouth/Throat:     Mouth: Mucous membranes are moist.     Pharynx: Oropharynx is clear.  Eyes:     Extraocular Movements: Extraocular movements intact.     Conjunctiva/sclera: Conjunctivae normal.     Pupils: Pupils are equal, round, and reactive to  light.  Cardiovascular:     Rate and Rhythm: Normal rate and regular rhythm.     Pulses: Normal pulses.     Heart sounds: Normal heart sounds.  Pulmonary:     Effort: Pulmonary effort is normal.     Breath sounds: Normal breath sounds.  Musculoskeletal:        General: Normal range of motion.     Cervical back: Normal range of motion and neck supple.  Skin:    General: Skin is warm.     Coloration: Skin is pale.  Neurological:     General: No focal deficit present.     Mental Status: He is alert and oriented to person, place, and time.  Psychiatric:        Mood and Affect: Mood normal.        Thought Content: Thought content normal.        Judgment: Judgment normal.     Assessment & Plan:   Problem List Items Addressed This Visit      Endocrine   Diabetes ([place]) - Primary   Relevant Medications   aspirin EC 81 MG tablet   atorvastatin (LIPITOR) 40 MG tablet   glucose blood (ACCU-CHEK AVIVA PLUS) test strip   Accu-Chek Softclix Lancets lancets   insulin NPH-regular Human (70-30) 100 UNIT/ML injection   Insulin Pen Needle (TRUEPLUS 5-BEVEL PEN NEEDLES) 32G X 4 MM MISC   metFORMIN (GLUCOPHAGE) 1000 MG tablet   Other Relevant Orders   HgB A1c (Completed)   Microalbumin / creatinine urine ratio (Completed)   CBC with Differential/Platelet (Completed)   Comp. Metabolic Panel (12) (Completed)   TSH (Completed)     Other   Major depressive disorder   Relevant Medications   hydrOXYzine (ATARAX/VISTARIL) 25 MG tablet   sertraline (ZOLOFT) 50 MG tablet   traZODone (DESYREL) 50 MG tablet    Other Visit Diagnoses    Hyperlipidemia, unspecified hyperlipidemia type       Relevant Medications   aspirin EC 81 MG tablet   atorvastatin (LIPITOR) 40 MG tablet   Other Relevant Orders   Lipid panel (Completed)   Vitamin D deficiency       Relevant Orders   Vitamin D, 25-hydroxy (Completed)   Psychophysiological insomnia       Neuropathy       Relevant Medications    gabapentin (NEURONTIN) 100 MG capsule   Screening PSA (prostate specific antigen)       Relevant Orders   PSA (Completed)   Gastroesophageal reflux disease without esophagitis       Relevant Medications   pantoprazole (PROTONIX) 40 MG tablet      Outpatient Encounter Medications as of 12/24/2020  Medication Sig  . Accu-Chek Softclix Lancets lancets Use as  instructed  . [EXPIRED] Blood Glucose Monitoring Suppl (ACCU-CHEK AVIVA PLUS) w/Device KIT 1 Units by Does not apply route once for 1 dose.  [name] glucose blood (ACCU-CHEK AVIVA PLUS) test strip Use as instructed  . [DISCONTINUED] aspirin EC 81 MG tablet Take 81 mg by mouth daily.  . [DISCONTINUED] atorvastatin (LIPITOR) 40 MG tablet Take 40 mg by mouth at bedtime.  . [DISCONTINUED] Blood Glucose Monitoring Suppl ([place]) w/Device KIT by Does not apply route.  . [DISCONTINUED] glucose blood test strip 1 each by Other route as needed for other. Use as instructed  . [DISCONTINUED] hydrOXYzine (ATARAX/VISTARIL) 25 MG tablet Take 25 mg by mouth every 6 (six) hours as needed.  . [DISCONTINUED] insulin NPH-regular Human (70-30) 100 UNIT/ML injection Inject 20 Units into the skin 2 (two) times daily with a meal.  . [DISCONTINUED] Insulin Pen Needle (TRUEPLUS 5-BEVEL PEN NEEDLES) 32G X 4 MM MISC 1 each by Does not apply route.  . [DISCONTINUED] metFORMIN (GLUCOPHAGE) 1000 MG tablet Take 1,000 mg by mouth 2 (two) times daily.  . [DISCONTINUED] OneTouch Delica Lancets 20U MISC 1 each by Does not apply route in the morning, at noon, and at bedtime.  . [DISCONTINUED] pantoprazole (PROTONIX) 40 MG tablet Take 40 mg by mouth daily.  . [DISCONTINUED] sertraline (ZOLOFT) 25 MG tablet Take 50 mg by mouth daily.  . [DISCONTINUED] traZODone (DESYREL) 50 MG tablet Take 50 mg by mouth at bedtime.  [name] aspirin EC 81 MG tablet Take 1 tablet (81 mg total) by mouth daily.  [name] atorvastatin (LIPITOR) 40 MG tablet Take 1 tablet (40 mg total) by mouth at  bedtime.  . folic acid (FOLVITE) 1 MG tablet Take 1 tablet (1 mg total) by mouth daily. (Patient not taking: Reported on 12/24/2020)  . gabapentin (NEURONTIN) 100 MG capsule Take 1 capsule (100 mg total) by mouth at bedtime.  . hydrOXYzine (ATARAX/VISTARIL) 25 MG tablet Take 1 tablet (25 mg total) by mouth every 6 (six) hours as needed.  . insulin NPH-regular Human (70-30) 100 UNIT/ML injection Inject 20 Units into the skin 2 (two) times daily with a meal.  . Insulin Pen Needle (TRUEPLUS 5-BEVEL PEN NEEDLES) 32G X 4 MM MISC 1 each by Does not apply route in the morning and at bedtime.  . metFORMIN (GLUCOPHAGE) 1000 MG tablet Take 1 tablet (1,000 mg total) by mouth 2 (two) times daily.  . Multiple Vitamin (MULTIVITAMIN WITH MINERALS) TABS tablet Take 1 tablet by mouth daily. (Patient not taking: Reported on 12/24/2020)  . pantoprazole (PROTONIX) 40 MG tablet Take 1 tablet (40 mg total) by mouth daily.  . sertraline (ZOLOFT) 50 MG tablet Take 1 tablet (50 mg total) by mouth daily.  [name] thiamine 100 MG tablet Take 1 tablet (100 mg total) by mouth daily.  . traZODone (DESYREL) 50 MG tablet Take 1 tablet (50 mg total) by mouth at bedtime.  . [DISCONTINUED] gabapentin (NEURONTIN) 100 MG capsule Take 100 mg by mouth 3 (three) times daily.  . [DISCONTINUED] JARDIANCE 25 MG TABS tablet Take 25 mg by mouth daily.  . [DISCONTINUED] lisinopril (ZESTRIL) 2.5 MG tablet Take 2.5 mg by mouth daily.  . [DISCONTINUED] VICTOZA 18 MG/3ML SOPN Inject 0.6 mg into the skin daily.   Facility-Administered Encounter Medications as of 12/24/2020  Medication  . nicotine (NICODERM CQ - dosed in mg/24 hr) patch 14 mg   1. Type 2 diabetes mellitus with other specified complication, with long-term current use of insulin [facility]) Patient had medication  list from his hospitalization in [place] in September 2021.  Medications refilled based on those records.  Gabapentin restarted at 100 mg at bedtime.  Fasting labs completed today.   Patient given appointment to follow-up with clinical pharmacist in 1 month, establish care with PCP in 2 to 3 months. - HgB A1c - Microalbumin / creatinine urine ratio - CBC with Differential/Platelet - Comp. Metabolic Panel (12) - TSH - Blood Glucose Monitoring Suppl (ACCU-CHEK AVIVA PLUS) w/Device KIT; 1 Units by Does not apply route once for 1 dose.  Dispense: 1 kit; Refill: 0 - glucose blood (ACCU-CHEK AVIVA PLUS) test strip; Use as instructed  Dispense: 100 each; Refill: 12 - Accu-Chek Softclix Lancets lancets; Use as instructed  Dispense: 100 each; Refill: 12 - insulin NPH-regular Human (70-30) 100 UNIT/ML injection; Inject 20 Units into the skin 2 (two) times daily with a meal.  Dispense: 10 mL; Refill: 2 - Insulin Pen Needle (TRUEPLUS 5-BEVEL PEN NEEDLES) 32G X 4 MM MISC; 1 each by Does not apply route in the morning and at bedtime.  Dispense: 100 each; Refill: 11 - metFORMIN (GLUCOPHAGE) 1000 MG tablet; Take 1 tablet (1,000 mg total) by mouth 2 (two) times daily.  Dispense: 60 tablet; Refill: 2  2. Moderate episode of recurrent major depressive disorder (HCC)  - hydrOXYzine (ATARAX/VISTARIL) 25 MG tablet; Take 1 tablet (25 mg total) by mouth every 6 (six) hours as needed.  Dispense: 60 tablet; Refill: 2 - sertraline (ZOLOFT) 50 MG tablet; Take 1 tablet (50 mg total) by mouth daily.  Dispense: 30 tablet; Refill: 2 - traZODone (DESYREL) 50 MG tablet; Take 1 tablet (50 mg total) by mouth at bedtime.  Dispense: 30 tablet; Refill: 2  3. Hyperlipidemia, unspecified hyperlipidemia type  - Lipid panel - aspirin EC 81 MG tablet; Take 1 tablet (81 mg total) by mouth daily.  Dispense: 30 tablet; Refill: 2 - atorvastatin (LIPITOR) 40 MG tablet; Take 1 tablet (40 mg total) by mouth at bedtime.  Dispense: 30 tablet; Refill: 2  4. Vitamin D deficiency  - Vitamin D, 25-hydroxy  5. Psychophysiological insomnia   6. Neuropathy  - gabapentin (NEURONTIN) 100 MG capsule; Take 1 capsule (100 mg  total) by mouth at bedtime.  Dispense: 30 capsule; Refill: 2  7. Screening PSA (prostate specific antigen)  - PSA  8. Gastroesophageal reflux disease without esophagitis  - pantoprazole (PROTONIX) 40 MG tablet; Take 1 tablet (40 mg total) by mouth daily.  Dispense: 30 tablet; Refill: 2   I have reviewed the patient's medical history (PMH, PSH, Social History, Family History, Medications, and allergies) , and have been updated if relevant. I spent 30 minutes reviewing chart and  face to face time with patient.     Follow-up: Return in about 1 month (around 01/21/2021).   Loraine Grip Sabreen Kitchen, PA-C

## 2020-12-25 ENCOUNTER — Telehealth: Payer: Self-pay | Admitting: General Practice

## 2020-12-25 LAB — CBC WITH DIFFERENTIAL/PLATELET
Basophils Absolute: 0 10*3/uL (ref 0.0–0.2)
Basos: 0 %
EOS (ABSOLUTE): 0.1 10*3/uL (ref 0.0–0.4)
Eos: 1 %
Hematocrit: 44.3 % (ref 37.5–51.0)
Hemoglobin: 14.7 g/dL (ref 13.0–17.7)
Immature Grans (Abs): 0 10*3/uL (ref 0.0–0.1)
Immature Granulocytes: 0 %
Lymphocytes Absolute: 2.5 10*3/uL (ref 0.7–3.1)
Lymphs: 29 %
MCH: 31.1 pg (ref 26.6–33.0)
MCHC: 33.2 g/dL (ref 31.5–35.7)
MCV: 94 fL (ref 79–97)
Monocytes Absolute: 0.7 10*3/uL (ref 0.1–0.9)
Monocytes: 8 %
Neutrophils Absolute: 5.4 10*3/uL (ref 1.4–7.0)
Neutrophils: 62 %
Platelets: 201 10*3/uL (ref 150–450)
RBC: 4.72 x10E6/uL (ref 4.14–5.80)
RDW: 12.3 % (ref 11.6–15.4)
WBC: 8.6 10*3/uL (ref 3.4–10.8)

## 2020-12-25 LAB — COMP. METABOLIC PANEL (12)
AST: 26 IU/L (ref 0–40)
Albumin/Globulin Ratio: 1.7 (ref 1.2–2.2)
Albumin: 4.3 g/dL (ref 3.8–4.9)
Alkaline Phosphatase: 74 IU/L (ref 44–121)
BUN/Creatinine Ratio: 12 (ref 9–20)
BUN: 7 mg/dL (ref 6–24)
Bilirubin Total: <0.2 mg/dL (ref 0.0–1.2)
Calcium: 9.8 mg/dL (ref 8.7–10.2)
Chloride: 102 mmol/L (ref 96–106)
Creatinine, Ser: 0.6 mg/dL — ABNORMAL LOW (ref 0.76–1.27)
GFR calc Af Amer: 130 mL/min/{1.73_m2} (ref >59)
GFR calc non Af Amer: 112 mL/min/{1.73_m2} (ref >59)
Globulin, Total: 2.6 g/dL (ref 1.5–4.5)
Glucose: 284 mg/dL — ABNORMAL HIGH (ref 65–99)
Potassium: 4.8 mmol/L (ref 3.5–5.2)
Sodium: 140 mmol/L (ref 134–144)
Total Protein: 6.9 g/dL (ref 6.0–8.5)

## 2020-12-25 LAB — MICROALBUMIN / CREATININE URINE RATIO
Creatinine, Urine: 67.2 mg/dL
Microalb/Creat Ratio: 58 mg/g creat — ABNORMAL HIGH (ref 0–29)
Microalbumin, Urine: 38.8 ug/mL

## 2020-12-25 LAB — LIPID PANEL
Chol/HDL Ratio: 2.8 ratio (ref 0.0–5.0)
Cholesterol, Total: 156 mg/dL (ref 100–199)
HDL: 55 mg/dL (ref >39)
LDL Chol Calc (NIH): 89 mg/dL (ref 0–99)
Triglycerides: 62 mg/dL (ref 0–149)
VLDL Cholesterol Cal: 12 mg/dL (ref 5–40)

## 2020-12-25 LAB — TSH: TSH: 0.953 u[IU]/mL (ref 0.450–4.500)

## 2020-12-25 LAB — VITAMIN D 25 HYDROXY (VIT D DEFICIENCY, FRACTURES): Vit D, 25-Hydroxy: 6.6 ng/mL — ABNORMAL LOW (ref 30.0–100.0)

## 2020-12-25 LAB — PSA: Prostate Specific Ag, Serum: 0.7 ng/mL (ref 0.0–4.0)

## 2020-12-25 NOTE — Telephone Encounter (Addendum)
Pt states he got his covid vaccines in [place].  He was living there for a while, but has returned home to [facility].  Pt doesn't remember which covid vaccine he go, but the last one was in August. Pt states he is going to go get his booster this week

## 2020-12-26 ENCOUNTER — Other Ambulatory Visit: Payer: Self-pay | Admitting: Physician Assistant

## 2020-12-26 DIAGNOSIS — E785 Hyperlipidemia, unspecified: Secondary | ICD-10-CM | POA: Insufficient documentation

## 2020-12-26 DIAGNOSIS — E559 Vitamin D deficiency, unspecified: Secondary | ICD-10-CM | POA: Insufficient documentation

## 2020-12-26 DIAGNOSIS — G4700 Insomnia, unspecified: Secondary | ICD-10-CM | POA: Insufficient documentation

## 2020-12-26 DIAGNOSIS — F5104 Psychophysiologic insomnia: Secondary | ICD-10-CM | POA: Insufficient documentation

## 2020-12-26 DIAGNOSIS — K219 Gastro-esophageal reflux disease without esophagitis: Secondary | ICD-10-CM | POA: Insufficient documentation

## 2020-12-26 DIAGNOSIS — G629 Polyneuropathy, unspecified: Secondary | ICD-10-CM | POA: Insufficient documentation

## 2020-12-26 MED ORDER — VITAMIN D (ERGOCALCIFEROL) 1.25 MG (50000 UNIT) PO CAPS: 50000.0000 [IU] | ORAL_CAPSULE | ORAL | 2 refills | Status: DC

## 2020-12-26 NOTE — Addendum Note (Signed)
Addended by: Kennieth Rad on: 12/26/2020 03:04 PM   Modules accepted: Orders

## 2020-12-28 ENCOUNTER — Telehealth: Payer: Self-pay | Admitting: *Deleted

## 2020-12-28 MED FILL — VIT D2 1.25 MG (50,000 UNIT: 1.25 MG | 28 days supply | Qty: 4 | Fill #0

## 2020-12-28 NOTE — Telephone Encounter (Signed)
Patient verified DOB Patient is aware of labs being normal except for needing to take once a week vitamin d and restarting his chronic medications to address increased creatinine levels. Patient understood and had no concerns.

## 2020-12-28 NOTE — Telephone Encounter (Signed)
-----   Message from Kennieth Rad, [place] sent at 12/26/2020  3:04 PM EST ----- Please call patient and let him know that his kidney and liver function are within normal limits, he does have increased microalbumin creatinine levels in his urine, however this should improve as he resumes his medications.  His screening for prostate cancer was negative, and his thyroid is within normal limits.  He does not show signs of anemia.  His vitamin D is very low, he needs to take 50,000 units once a week for the next 12 weeks.  Prescription sent to his pharmacy.

## 2021-01-01 NOTE — BH Specialist Note (Signed)
Integrated Behavioral Health Initial In-Person Visit  MRN: [id number] Name: Samuel [name]  Number of [place] Clinician visits:: 1/6 Session Start time: 9:50 AM  Session End time: 10:20 AM Total time: 30 minutes  Types of Service: Individual psychotherapy  Interpretor:No. Interpretor Name and Language: NA   Warm Hand Off Completed.       Subjective: Samuel [name] is a 55 y.o. male  Patient was referred by [facility] for depression. Patient reports the following symptoms/concerns: Pt reports difficulty managing symptoms of depression. Reports hx of alcohol abuse; however, he reports significant decrease in intake. Pt is experiencing psychosocial stressors, including upcoming court hearing Duration of problem: Ongoing; Severity of problem: severe  Objective: Mood: Anxious and Affect: Appropriate Risk of harm to self or others: Suicidal ideation No plan to harm self or others Pt scored positive on the phq9; however, denies current suicidal/homicidal ideation with no plan or intent. Protective factors identified, safety plan discussed, and crisis intervention resources provided  Life Context: Family and Social: Pt receives strong support from family. He currently resides with sister School/Work: Pt is not employed at this time. Currently uninsured Self-Care: Pt reports hx of alcohol use; however, endorses significant decrease in intake. Has participated in services through a half-way house in [place]. Life Changes: Pt is experiencing psychosocial stressors that has negatively impacted his ability to manage mental and physical health conditions  Patient and/or Family's Strengths/Protective Factors: Social connections, Social and Emotional competence, Concrete supports in place (healthy food, safe environments, etc.) and Sense of purpose  Goals Addressed: Patient will: 1. Reduce symptoms of: anxiety and depression Pt agreed to comply with prescribed  medications 2. Increase knowledge and/or ability of: coping skills and healthy habits Pt agreed to continue utilizing healthy coping skills discussed 3. Demonstrate ability to: Increase healthy adjustment to current life circumstances, Increase adequate support systems for patient/family and Decrease self-medicating behaviors Pt agreed to utilize crisis intervention resource, if needed. He will continue to limit alcohol use  Progress towards Goals: Ongoing  Interventions: Interventions utilized: Solution-Focused Strategies, Supportive Counseling, Psychoeducation and/or Health Education and Link to [employer]  Standardized Assessments completed: GAD-7 and PHQ 2&9 with C-SSRS  Patient Response: Pt was engaged during session and participated in identifying goals to assist with management of symptoms  Patient Centered Plan: Patient is on the following Treatment Plan(s):  Anxiety, Depression  Assessment: Patient currently experiencing psychosocial stressors that has negatively impacted his ability to manage mental and physical health conditions. Pt scored positive on the phq9; however, denies current suicidal/homicidal ideation with no plan or intent. Protective factors identified, safety plan discussed, and crisis intervention resources provided   Patient may benefit from establishing therapy and continuing medication management through PCP.  Plan: 1. Follow up with behavioral health clinician on : Contact LCSW with any additional behavioral health and/or resource needs 2. Behavioral recommendations: Utilize strategies discussed and resources provided  3. Referral(s): [place] (In Clinic) and [place] (LME/Outside Clinic) 4. "From scale of 1-10, how likely are you to follow plan?":   Rebekah Chesterfield, LCSW  01/01/21 3:55 PM

## 2021-01-20 ENCOUNTER — Encounter: Payer: Self-pay | Admitting: Family

## 2021-01-20 ENCOUNTER — Ambulatory Visit (INDEPENDENT_AMBULATORY_CARE_PROVIDER_SITE_OTHER): Payer: Self-pay | Admitting: Family

## 2021-01-20 VITALS — BP 116/78 | HR 99 | Ht 68.47 in | Wt 150.2 lb

## 2021-01-20 DIAGNOSIS — G629 Polyneuropathy, unspecified: Secondary | ICD-10-CM

## 2021-01-20 DIAGNOSIS — E1169 Type 2 diabetes mellitus with other specified complication: Secondary | ICD-10-CM

## 2021-01-20 DIAGNOSIS — Z794 Long term (current) use of insulin: Secondary | ICD-10-CM

## 2021-01-20 DIAGNOSIS — F331 Major depressive disorder, recurrent, moderate: Secondary | ICD-10-CM

## 2021-01-20 DIAGNOSIS — K219 Gastro-esophageal reflux disease without esophagitis: Secondary | ICD-10-CM

## 2021-01-20 DIAGNOSIS — Z7689 Persons encountering health services in other specified circumstances: Secondary | ICD-10-CM

## 2021-01-20 DIAGNOSIS — E785 Hyperlipidemia, unspecified: Secondary | ICD-10-CM

## 2021-01-20 DIAGNOSIS — F411 Generalized anxiety disorder: Secondary | ICD-10-CM

## 2021-01-20 NOTE — Patient Instructions (Signed)
Return for annual physical examination, labs, and health maintenance. Arrive fasting meaning having had no food and/or nothing to drink for at least 8 hours prior to appointment.  Keep diabetes checkup appointment with pharmacist scheduled for tomorrow 01/21/2021. Referral to Psychiatry. [facility] [address] [place], [place] [address] Phone # [phone] Walk-Ins Welcome Thank you for choosing Primary Care at [facility] for your medical home!    Samuel [name] was seen by Camillia Herter, NP today.   Samuel [name]'s primary care provider is Camillia Herter, NP.   For the best care possible,  you should try to see Samuel Fruits, NP whenever you come to clinic.   We look forward to seeing you again soon!  If you have any questions about your visit today,  please call us at [phone]  Or feel free to reach your provider via [place].    Type 2 Diabetes Mellitus, Diagnosis, Adult Type 2 diabetes (type 2 diabetes mellitus) is a long-term disease. It may happen when there is one or both of these problems:  The pancreas does not make enough insulin.  The body does not react in a normal way to insulin that it makes. Insulin lets sugars go into cells in your body. If you have type 2 diabetes, sugars cannot get into your cells. Sugars build up in the blood. This causes high blood sugar. What are the causes? The exact cause of this condition is not known. What increases the risk? The following factors may make you more likely to develop this condition:  Having type 2 diabetes in your family.  Being overweight or very overweight.  Not being active.  Your body not reacting in a normal way to the insulin it makes.  Having higher than normal blood sugar over time.  Having a type of diabetes when you were pregnant.  Having a condition that causes small fluid-filled sacs on your ovaries. What are the signs or symptoms? At first, you may  have no symptoms. You will get symptoms slowly. They may include:  More thirst than normal.  More hunger than normal.  Needing to pee more than normal.  Losing weight without trying.  Feeling tired.  Feeling weak.  Seeing things blurry.  Dark patches on your skin. How is this treated? This condition may be treated by a diabetes expert. You may need to:  Follow an eating plan made by a food expert (dietitian).  Get regular exercise.  Find ways to deal with stress.  Check blood sugar as often as told.  Take medicines. Your doctor will set treatment goals for you. Your blood sugar should be at these levels:  Before meals: 80-130 mg/dL (4.4-7.2 mmol/L).  After meals: below 180 mg/dL (10 mmol/L).  Over the last 2-3 months: less than 7%. Follow these instructions at home: Medicines  Take your diabetes medicines or insulin every day.  Take medicines to help you not get other problems caused by this condition. You may need: ? Aspirin. ? Medicine to lower cholesterol. ? Medicine to control blood pressure. Questions to ask your doctor  Should I meet with a diabetes educator?  What medicines do I need, and when should I take them?  What will I need to treat my condition at home?  When should I check my blood sugar?  Where can I find a support group?  Who can I call if I have questions?  When is my  next doctor visit? General instructions  Take over-the-counter and prescription medicines only as told by your doctor.  Keep all follow-up visits as told by your doctor. This is important. Where to find more information  American Diabetes Association (ADA): www.diabetes.org  American Association of Diabetes Care and Education Specialists (ADCES): www.diabeteseducator.org  International Diabetes Federation (IDF): MemberVerification.ca Contact a doctor if:  Your blood sugar is at or above 240 mg/dL (13.3 mmol/L) for 2 days in a row.  You have been sick for 2 days or  more, and you are not getting better.  You have had a fever for 2 days or more, and you are not getting better.  You have any of these problems for more than 6 hours: ? You cannot eat or drink. ? You feel like you may vomit. ? You vomit. ? You have watery poop (diarrhea). Get help right away if:  Your blood sugar is very low. This means it is lower than 54 mg/dL (3 mmol/L).  You feel mixed up (confused).  You have trouble thinking clearly.  You have trouble breathing.  You have medium or large ketone levels in your pee. These symptoms may be an emergency. Do not wait to see if the symptoms will go away. Get medical help right away. Call your local emergency services (911 in the [place] Do not drive yourself to the hospital. Summary  Type 2 diabetes is a long-term disease. Your pancreas may not make enough insulin, or your body may not react in a normal way to insulin that it makes.  This condition is treated with an eating plan, lifestyle changes, and medicines.  Your doctor will set treatment goals for you. These will help you keep your blood sugar in a healthy range.  Keep all follow-up visits as told by your doctor. This is important. This information is not intended to replace advice given to you by your health care provider. Make sure you discuss any questions you have with your health care provider. Document Revised: 06/03/2020 Document Reviewed: 06/03/2020 Elsevier Patient Education  [place].

## 2021-01-20 NOTE — Progress Notes (Signed)
Subjective:    Samuel [name] - 55 y.o. male MRN [id number]  Date of birth: 05/28/1966  HPI  Samuel [name] is to establish care. 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   Current issues and/or concerns: 1. DIABETES TYPE 2 FOLLOW-UP: Visit 12/24/2020 per PA note: Patient had medication list from his hospitalization in [place] in September 2021.  Medications refilled based on those records.  Gabapentin restarted at 100 mg at bedtime.  Fasting labs completed today.  Patient given appointment to follow-up with clinical pharmacist in 1 month, establish care with PCP in 2 to 3 months. Prescribed insulin and Metformin.  01/20/2021: Last A1C: 13.1% on 12/24/2020 Med Adherence:  []  Yes    [x]  No, states initially he was on an insulin pen. Then was switched to needles with the insulin bottle for what he believes is financial reasons. Difficulty to draw up the insulin with the needles because he feels shaky especially when withdrawing from alcohol (consuming a 6-pack at least 3 days weekly). Taking insulin at least every 2 days. Taking Metformin as prescribed. Medication side effects:  []  Yes    [x]  No Home Monitoring?  []  Yes    [x]  No Home glucose results range: none Diet Adherence: []  Yes    [x]  No Exercise: walking   2. ANXIETY AND DEPRESSION FOLLOW-UP: Visit 12/24/2020 per PA note: Prescribed Hydroxyzine, Sertraline, and Trazodone.  01/20/2021: Reports he quit taking the anxiety and depression medication because it made him feel loopy. Not interested in beginning new medications at this time. Reports anxiety and depression primarily related to be in prison for 26 years and 9 months. Says he would like  someone to talk to. Denies thoughts of self-harm, suicidal ideations, and homicidal ideations.   ROS per HPI   Health Maintenance:  Health Maintenance Due  Topic Date Due  . COVID-19 Vaccine (1) Never done  . FOOT EXAM  Never done  . OPHTHALMOLOGY EXAM  Never done  . COLONOSCOPY (Pts 45-43yrs Insurance coverage will need to be confirmed)  Never done  . INFLUENZA VACCINE  06/21/2020    Past Medical History: Patient Active Problem List   Diagnosis Date Noted  . Hyperlipidemia 12/26/2020  . Vitamin D deficiency 12/26/2020  . Psychophysiological insomnia 12/26/2020  . Neuropathy 12/26/2020  . Gastroesophageal reflux disease without esophagitis 12/26/2020  . Nausea and vomiting 03/20/2020  . Pancreatitis 03/20/2020  . Other male erectile dysfunction 11/20/2019  . Microalbuminuria due to type 2 diabetes mellitus ([place]) 09/10/2019  . Tobacco dependence 08/13/2019  . DM type 2 with diabetic peripheral neuropathy ([place]) 08/13/2019  . Clavicle fracture 02/28/2017  . Alcohol withdrawal ([place]) 12/26/2016  . Alcohol use disorder, severe, dependence ([place]) 12/26/2016  . Major depressive disorder 12/26/2016  . Diabetes ([place]) 12/26/2016  . Tobacco use disorder 12/26/2016    Social History   reports that he has been smoking cigarettes. He has been smoking about 1.00 pack per day. He has never used smokeless tobacco. He reports current alcohol use of about 50.0 standard drinks of alcohol per week. He reports that he does not use drugs.   Family History  family history is not on file.   Medications: reviewed and updated   Objective:   Physical Exam BP 116/78 (BP Location: Left Arm, Patient Position: Sitting)  Pulse 99   Ht 5' 8.47" (1.739 m)   Wt 150 lb 3.2 oz (68.1 kg)   SpO2 98%   BMI 22.53 kg/m  Physical Exam Constitutional:      Appearance: He is normal weight.  HENT:     Head: Normocephalic and atraumatic.  Eyes:     Extraocular Movements: Extraocular movements intact.      Pupils: Pupils are equal, round, and reactive to light.  Cardiovascular:     Rate and Rhythm: Normal rate and regular rhythm.     Pulses: Normal pulses.     Heart sounds: Normal heart sounds.  Pulmonary:     Effort: Pulmonary effort is normal.     Breath sounds: Normal breath sounds.  Musculoskeletal:     Cervical back: Normal range of motion and neck supple.  Neurological:     General: No focal deficit present.     Mental Status: He is alert and oriented to person, place, and time.  Psychiatric:        Mood and Affect: Mood normal.        Behavior: Behavior normal.      Assessment & Plan:  1. Encounter to establish care: - Patient presents today to establish care.  - Return for annual physical examination, labs, and health maintenance. Arrive fasting meaning having had no food and/or nothing to drink for at least 8 hours prior to appointment.  Please take scheduled medications as normal.  2. Type 2 diabetes mellitus with other specified complication, with long-term current use of insulin ([place]): - Continue Metformin, Insulin, and Gabapentin as prescribed.  - Keep appointment with pharmacist at [facility] scheduled tomorrow 01/21/2021. - Follow-up with primary provider as scheduled.   3. Moderate episode of recurrent major depressive disorder ([place]): 4. GAD (generalized anxiety disorder): - Reports he quit taking the anxiety medication because it made him feel loopy. Not interested in beginning new medications at this time. Reports anxiety and depression primarily related to be in prison for 26 years and 9 months. Says he would like someone to talk to. Denies thoughts of self-harm, suicidal ideations, and homicidal ideations.  - Referral to Psychiatry for further evaluation and management. - Follow-up with primary provider as needed.  - Ambulatory referral to Psychiatry  5. Hyperlipidemia, unspecified hyperlipidemia type: - Continue  Atorvastatin as prescribed.  - Practice low-fat heart healthy diet and at least 150 minutes of moderate intensity exercise weekly as tolerated.  - Follow-up with primary provider as scheduled.   6. Neuropathy: - Continue Gabapentin as prescribed. - Follow-up with primary provider as scheduled.   7. Gastroesophageal reflux disease without esophagitis: - Continue Pantoprazole as prescribed.  - Follow-up with primary provider as scheduled.   Patient was given clear instructions to go to Emergency Department or return to medical center if symptoms don't improve, worsen, or new problems develop.The patient verbalized understanding.  I discussed the assessment and treatment plan with the patient. The patient was provided an opportunity to ask questions and all were answered. The patient agreed with the plan and demonstrated an understanding of the instructions.   The patient was advised to call back or seek an in-person evaluation if the symptoms worsen or if the condition fails to improve as anticipated.    Durene Fruits, NP 01/21/2021, 11:12 PM Primary Care at [facility]

## 2021-01-20 NOTE — Progress Notes (Unsigned)
Follow-up pt seen Carrolyn Meiers, PA on 12/24/2020

## 2021-01-21 ENCOUNTER — Encounter: Payer: Self-pay | Admitting: Pharmacist

## 2021-01-21 ENCOUNTER — Other Ambulatory Visit: Payer: Self-pay | Admitting: Family Medicine

## 2021-01-21 ENCOUNTER — Ambulatory Visit: Payer: Self-pay | Attending: Family | Admitting: Pharmacist

## 2021-01-21 ENCOUNTER — Other Ambulatory Visit: Payer: Self-pay

## 2021-01-21 VITALS — BP 136/76 | HR 99

## 2021-01-21 DIAGNOSIS — E1142 Type 2 diabetes mellitus with diabetic polyneuropathy: Secondary | ICD-10-CM

## 2021-01-21 MED ORDER — ACCU-CHEK GUIDE VI STRP: ORAL_STRIP | 2 refills | Status: DC

## 2021-01-21 MED ORDER — TRUEPLUS PEN NEEDLES 32G X 4 MM MISC: 2 refills | Status: DC

## 2021-01-21 MED ORDER — TRUEPLUS LANCETS 28G MISC: 2 refills | Status: DC

## 2021-01-21 MED ORDER — ACCU-CHEK GUIDE ME W/DEVICE KIT: PACK | 0 refills | Status: DC

## 2021-01-21 MED ORDER — LANTUS SOLOSTAR 100 UNIT/ML ~~LOC~~ SOPN: 28.0000 [IU] | PEN_INJECTOR | Freq: Every day | SUBCUTANEOUS | 1 refills | Status: DC

## 2021-01-21 MED ORDER — ACCU-CHEK FASTCLIX LANCETS MISC: 2 refills | Status: DC

## 2021-01-21 MED FILL — TRUEplus LANCETS 28G MISC: 33 days supply | Qty: 100 | Fill #0

## 2021-01-21 MED FILL — TRUE METRIX GLUCOSE TEST ST: 30 days supply | Qty: 100 | Fill #1

## 2021-01-21 MED FILL — METFORMIN HCL 1000 MG TABS: 1000 | 30 days supply | Qty: 60 | Fill #1

## 2021-01-21 MED FILL — !LANTUS SOLOSTAR 100UNITS/M: 100 | 32 days supply | Qty: 9 | Fill #0

## 2021-01-21 NOTE — Progress Notes (Signed)
    S:    PCP: Durene Fruits  No chief complaint on file.  Patient arrives in good spirits. Presents for diabetes evaluation, education, and management. Patient was referred and last seen by Primary Care Provider on 01/20/2021.   Patient reports Diabetes was diagnosed in ~2011.   Family/Social History:  - FHx: no pertinent positives  - Tobacco: current 1 PPD - Alcohol: suffers from alcohol use disorder; denies use today since d/c from hospital  Insurance coverage/medication affordability: Dnbi  Medication adherence reported.   Current diabetes medications include: Novolin 70/30 20u BID, metformin 1000 mg BID  Patient denies hypoglycemic events.  Patient reported dietary habits:  - Diet is limited to what he can get   Patient-reported exercise habits:  - None   Patient reports nocturia (nighttime urination).  Patient reports neuropathy (nerve pain). Patient reports visual changes. Patient denies self foot exams.     O:  POCT: 232  Lab Results  Component Value Date   HGBA1C 13.1 (A) 12/24/2020   There were no vitals filed for this visit.  Lipid Panel     Component Value Date/Time   CHOL 156 12/24/2020 1144   TRIG 62 12/24/2020 1144   HDL 55 12/24/2020 1144   CHOLHDL 2.8 12/24/2020 1144   LDLCALC 89 12/24/2020 1144   Home fasting blood sugars: not checking  2 hour post-meal/random blood sugars: not checking.  Clinical Atherosclerotic Cardiovascular Disease (ASCVD): No  The 10-year ASCVD risk score Mikey Bussing DC Jr., et al., 2013) is: 23.3%   Values used to calculate the score:     Age: 55 years     Sex: Male     Is Non-Hispanic African American: Yes     Diabetic: Yes     Tobacco smoker: Yes     Systolic Blood Pressure: 341 mmHg     Is BP treated: No     HDL Cholesterol: 55 mg/dL     Total Cholesterol: 156 mg/dL    A/P: Diabetes longstanding currently uncontrolled. Complicated by alcohol use disorder. Patient is able to verbalize appropriate hypoglycemia  management plan. Medication adherence denied. Control is suboptimal due to medication nonadherence, dietary indiscretion, alcohol abuse, and sedentary lifestyle. -Discontinued Novolin 70/30. -Start Lantus 28 units daily.  -Continued metformin 1000 mg BID  -Extensively discussed pathophysiology of diabetes, recommended lifestyle interventions, dietary effects on blood sugar control -Counseled on s/sx of and management of hypoglycemia -Next A1C anticipated 03/2021.   Written patient instructions provided. Total time in face to face counseling 30 minutes.  Follow up Pharmacist Clinic Visit in 49.     Benard Halsted, PharmD, [facility], [place] [phone]

## 2021-02-12 ENCOUNTER — Emergency Department (HOSPITAL_COMMUNITY)
Admission: EM | Admit: 2021-02-12 | Discharge: 2021-02-12 | Disposition: A | Discharge status: Home / Self Care | Payer: BLUE CROSS/BLUE SHIELD | Attending: Emergency Medicine | Admitting: Emergency Medicine

## 2021-02-12 ENCOUNTER — Ambulatory Visit: Payer: BLUE CROSS/BLUE SHIELD | Attending: Family Medicine | Admitting: Pharmacist

## 2021-02-12 ENCOUNTER — Other Ambulatory Visit: Payer: Self-pay

## 2021-02-12 ENCOUNTER — Encounter (HOSPITAL_COMMUNITY): Payer: Self-pay | Admitting: Emergency Medicine

## 2021-02-12 DIAGNOSIS — E1140 Type 2 diabetes mellitus with diabetic neuropathy, unspecified: Secondary | ICD-10-CM | POA: Diagnosis not present

## 2021-02-12 DIAGNOSIS — Z7982 Long term (current) use of aspirin: Secondary | ICD-10-CM | POA: Diagnosis not present

## 2021-02-12 DIAGNOSIS — F17210 Nicotine dependence, cigarettes, uncomplicated: Secondary | ICD-10-CM | POA: Diagnosis not present

## 2021-02-12 DIAGNOSIS — Z7984 Long term (current) use of oral hypoglycemic drugs: Secondary | ICD-10-CM | POA: Insufficient documentation

## 2021-02-12 DIAGNOSIS — E1142 Type 2 diabetes mellitus with diabetic polyneuropathy: Secondary | ICD-10-CM

## 2021-02-12 DIAGNOSIS — R739 Hyperglycemia, unspecified: Secondary | ICD-10-CM

## 2021-02-12 DIAGNOSIS — E1165 Type 2 diabetes mellitus with hyperglycemia: Secondary | ICD-10-CM | POA: Insufficient documentation

## 2021-02-12 DIAGNOSIS — Z794 Long term (current) use of insulin: Secondary | ICD-10-CM | POA: Insufficient documentation

## 2021-02-12 LAB — BASIC METABOLIC PANEL
Anion gap: 7 (ref 5–15)
BUN: 8 mg/dL (ref 6–20)
CO2: 25 mmol/L (ref 22–32)
Calcium: 9.3 mg/dL (ref 8.9–10.3)
Chloride: 102 mmol/L (ref 98–111)
Creatinine, Ser: 0.59 mg/dL — ABNORMAL LOW (ref 0.61–1.24)
GFR, Estimated: >60 mL/min (ref >60)
Glucose, Bld: 361 mg/dL — ABNORMAL HIGH (ref 70–99)
Potassium: 4 mmol/L (ref 3.5–5.1)
Sodium: 134 mmol/L — ABNORMAL LOW (ref 135–145)

## 2021-02-12 LAB — POCT URINALYSIS DIP (CLINITEK)
Bilirubin, UA: NEGATIVE
Blood, UA: NEGATIVE
Glucose, UA: 500 mg/dL — AB
Leukocytes, UA: NEGATIVE
Nitrite, UA: NEGATIVE
POC PROTEIN,UA: NEGATIVE
Spec Grav, UA: 1.02 (ref 1.010–1.025)
Urobilinogen, UA: 0.2 E.U./dL
pH, UA: 6.5 (ref 5.0–8.0)

## 2021-02-12 LAB — CBC
HCT: 42.4 % (ref 39.0–52.0)
Hemoglobin: 14 g/dL (ref 13.0–17.0)
MCH: 32 pg (ref 26.0–34.0)
MCHC: 33 g/dL (ref 30.0–36.0)
MCV: 97 fL (ref 80.0–100.0)
Platelets: 226 10*3/uL (ref 150–400)
RBC: 4.37 MIL/uL (ref 4.22–5.81)
RDW: 12.6 % (ref 11.5–15.5)
WBC: 9.1 10*3/uL (ref 4.0–10.5)
nRBC: 0 % (ref 0.0–0.2)

## 2021-02-12 LAB — GLUCOSE, POCT (MANUAL RESULT ENTRY): POC Glucose: 406 mg/dl — AB (ref 70–99)

## 2021-02-12 LAB — URINALYSIS, ROUTINE W REFLEX MICROSCOPIC
Bacteria, UA: NONE SEEN
Bilirubin Urine: NEGATIVE
Glucose, UA: >=500 mg/dL — AB
Hgb urine dipstick: NEGATIVE
Ketones, ur: 20 mg/dL — AB
Leukocytes,Ua: NEGATIVE
Nitrite: NEGATIVE
Protein, ur: NEGATIVE mg/dL
Specific Gravity, Urine: 1.038 — ABNORMAL HIGH (ref 1.005–1.030)
pH: 6 (ref 5.0–8.0)

## 2021-02-12 LAB — CBG MONITORING, ED: Glucose-Capillary: 341 mg/dL — ABNORMAL HIGH (ref 70–99)

## 2021-02-12 MED ORDER — INSULIN GLARGINE 100 UNIT/ML ~~LOC~~ SOLN
28.0000 [IU] | Freq: Once | SUBCUTANEOUS | Status: AC
  Administered 2021-02-12: 28 [IU] via SUBCUTANEOUS
  Filled 2021-02-12: qty 0.28

## 2021-02-12 NOTE — ED Provider Notes (Addendum)
[facility] EMERGENCY DEPARTMENT Provider Note   CSN: [id number] Arrival date & time: 02/12/21  1027     History Chief Complaint  Patient presents with  . Hyperglycemia    Samuel [name] is a 55 y.o. male.  55 year old male presents with hyperglycemia from clinic.  Patient has been noncompliant with his insulin as well as adhering to a diabetic diet.  He denies any polyuria or polydipsia.  Denies any weakness.  Feels at his baseline.  Had a CBG that was elevated and some ketones in his urine.  was sent here for IV fluids        Past Medical History:  Diagnosis Date  . Diabetes mellitus without complication ([place])   . Pancreatitis unk    Patient Active Problem List   Diagnosis Date Noted  . Hyperlipidemia 12/26/2020  . Vitamin D deficiency 12/26/2020  . Psychophysiological insomnia 12/26/2020  . Neuropathy 12/26/2020  . Gastroesophageal reflux disease without esophagitis 12/26/2020  . Nausea and vomiting 03/20/2020  . Pancreatitis 03/20/2020  . Other male erectile dysfunction 11/20/2019  . Microalbuminuria due to type 2 diabetes mellitus ([place]) 09/10/2019  . Tobacco dependence 08/13/2019  . DM type 2 with diabetic peripheral neuropathy ([place]) 08/13/2019  . Clavicle fracture 02/28/2017  . Alcohol withdrawal ([place]) 12/26/2016  . Alcohol use disorder, severe, dependence ([place]) 12/26/2016  . Major depressive disorder 12/26/2016  . Diabetes ([place]) 12/26/2016  . Tobacco use disorder 12/26/2016    History reviewed. No pertinent surgical history.     No family history on file.  Social History   Tobacco Use  . Smoking status: Current Every Day Smoker    Packs/day: 1.00    Types: Cigarettes  . Smokeless tobacco: Never Used  Substance Use Topics  . Alcohol use: Yes    Alcohol/week: 50.0 standard drinks    Types: 50 Cans of beer per week    Comment: last thursday - a couple of beers  . Drug use: No    Home Medications Prior to Admission  medications   Medication Sig Start Date End Date Taking? Authorizing Provider  aspirin EC 81 MG tablet Take 1 tablet (81 mg total) by mouth daily. 12/24/20   Mayers, Cari S, PA-C  atorvastatin (LIPITOR) 40 MG tablet Take 1 tablet (40 mg total) by mouth at bedtime. 12/24/20   Mayers, Cari S, PA-C  Blood Glucose Monitoring Suppl (ACCU-CHEK GUIDE ME) w/Device KIT Use to check sugar TID. 01/21/21   Charlott Rakes, MD  folic acid (FOLVITE) 1 MG tablet Take 1 tablet (1 mg total) by mouth daily. Patient not taking: Reported on 12/24/2020 03/22/20   Samuella Cota, MD  gabapentin (NEURONTIN) 100 MG capsule Take 1 capsule (100 mg total) by mouth at bedtime. 12/24/20   Mayers, Cari S, PA-C  glucose blood (ACCU-CHEK GUIDE) test strip Use to check sugar TID. 01/21/21   Charlott Rakes, MD  hydrOXYzine (ATARAX/VISTARIL) 25 MG tablet Take 1 tablet (25 mg total) by mouth every 6 (six) hours as needed. Patient not taking: Reported on 01/20/2021 12/24/20   Mayers, Cari S, PA-C  insulin glargine (LANTUS SOLOSTAR) 100 UNIT/ML Solostar Pen Inject 28 Units into the skin daily. 01/21/21   Charlott Rakes, MD  Insulin Pen Needle (TRUEPLUS PEN NEEDLES) 32G X 4 MM MISC Use to inject Lantus once daily. 01/21/21   Charlott Rakes, MD  metFORMIN (GLUCOPHAGE) 1000 MG tablet Take 1 tablet (1,000 mg total) by mouth 2 (two) times daily. 12/24/20   Mayers, Johnette Abraham  S, PA-C  Multiple Vitamin (MULTIVITAMIN WITH MINERALS) TABS tablet Take 1 tablet by mouth daily. 03/22/20   Samuella Cota, MD  pantoprazole (PROTONIX) 40 MG tablet Take 1 tablet (40 mg total) by mouth daily. 12/24/20   Mayers, Cari S, PA-C  sertraline (ZOLOFT) 50 MG tablet Take 1 tablet (50 mg total) by mouth daily. Patient not taking: Reported on 01/20/2021 12/24/20   Mayers, Cari S, PA-C  thiamine 100 MG tablet Take 1 tablet (100 mg total) by mouth daily. 03/22/20   Samuella Cota, MD  traZODone (DESYREL) 50 MG tablet Take 1 tablet (50 mg total) by mouth at bedtime. Patient not taking:  Reported on 01/20/2021 12/24/20   Mayers, Cari S, PA-C  TRUEplus Lancets 28G MISC Use to check TID. 01/21/21   Charlott Rakes, MD  Vitamin D, Ergocalciferol, (DRISDOL) 1.25 MG (50000 UNIT) CAPS capsule Take 1 capsule (50,000 Units total) by mouth every 7 (seven) days. 12/26/20   Mayers, Cari S, PA-C    Allergies    Penicillins  Review of Systems   Review of Systems  All other systems reviewed and are negative.   Physical Exam Updated Vital Signs BP 128/81 (BP Location: Left Arm)   Pulse 86   Temp 98.7 F (37.1 C)   Resp 16   SpO2 100%   Physical Exam Vitals and nursing note reviewed.  Constitutional:      General: He is not in acute distress.    Appearance: Normal appearance. He is well-developed. He is not toxic-appearing.  HENT:     Head: Normocephalic and atraumatic.  Eyes:     General: Lids are normal.     Conjunctiva/sclera: Conjunctivae normal.     Pupils: Pupils are equal, round, and reactive to light.  Neck:     Thyroid: No thyroid mass.     Trachea: No tracheal deviation.  Cardiovascular:     Rate and Rhythm: Normal rate and regular rhythm.     Heart sounds: Normal heart sounds. No murmur heard. No gallop.   Pulmonary:     Effort: Pulmonary effort is normal. No respiratory distress.     Breath sounds: Normal breath sounds. No stridor. No decreased breath sounds, wheezing, rhonchi or rales.  Abdominal:     General: Bowel sounds are normal. There is no distension.     Palpations: Abdomen is soft.     Tenderness: There is no abdominal tenderness. There is no rebound.  Musculoskeletal:        General: No tenderness. Normal range of motion.     Cervical back: Normal range of motion and neck supple.  Skin:    General: Skin is warm and dry.     Findings: No abrasion or rash.  Neurological:     Mental Status: He is alert and oriented to person, place, and time.     GCS: GCS eye subscore is 4. GCS verbal subscore is 5. GCS motor subscore is 6.     Cranial Nerves: No  cranial nerve deficit.     Sensory: No sensory deficit.  Psychiatric:        Speech: Speech normal.        Behavior: Behavior normal.     ED Results / Procedures / Treatments   Labs (all labs ordered are listed, but only abnormal results are displayed) Labs Reviewed  CBG MONITORING, ED - Abnormal; Notable for the following components:      Result Value   Glucose-Capillary 341 (*)    All  other components within normal limits  BASIC METABOLIC PANEL  CBC  URINALYSIS, ROUTINE W REFLEX MICROSCOPIC    EKG None  Radiology No results found.  Procedures Procedures   Medications Ordered in ED Medications  insulin glargine (LANTUS) injection 28 Units (has no administration in time range)    ED Course  I have reviewed the triage vital signs and the nursing notes.  Pertinent labs & imaging results that were available during my care of the patient were reviewed by me and considered in my medical decision making (see chart for details).    MDM Rules/Calculators/A&P                         Patient's blood sugar here in the 300s.  He is well-appearing. Patient offered IV fluids here and has declined.  Patient given dose of his Lantus here.  And return precautions given Final Clinical Impression(s) / ED Diagnoses Final diagnoses:  None    Rx / DC Orders ED Discharge Orders    None       Lacretia Leigh, MD 02/12/21 1109    Lacretia Leigh, MD 02/12/21 1110

## 2021-02-12 NOTE — ED Triage Notes (Addendum)
[facility], CBG 402. Patient states he started a new job recently and has not been taking his lantus regularly. Patient alert, oriented, and in no apparent distress at this time. Patient has no complaints and [facility] for [facility]up.

## 2021-02-12 NOTE — Progress Notes (Signed)
    S:    PCP: Durene Fruits  No chief complaint on file.  Patient arrives in good spirits. Presents for diabetes evaluation, education, and management. Patient was referred and last seen by Primary Care Provider on 01/20/2021.  Last seen by CPP on 01/21/21. At this visit, the novolin 70/30 was discontinued and lantus was initiated. Pt arrives today reporting poor medication adherence without taking insulin in 2-3 days. Reports some neuropathy in his feet and "foggy vision" when he wakes up.   Patient reports Diabetes was diagnosed in ~2011.   Family/Social History:  - FHx: no pertinent positives  - Tobacco: current 1 PPD - Alcohol: suffers from alcohol use disorder  Insurance coverage/medication affordability: Dnbi  Medication adherence poor. (missed half of his doses in the past 2 weeks) Current diabetes medications include: lantus 28 units daily, metformin 1000 mg BID  Patient denies hypoglycemic events.  Patient reported dietary habits:  - Diet is limited to what he can get   Patient-reported exercise habits:  - None   Patient denies nocturia (nighttime urination).  Patient reports neuropathy (nerve pain). Patient reports visual changes. Patient denies self foot exams.     O:  POCT: 406  Lab Results  Component Value Date   HGBA1C 13.1 (A) 12/24/2020   There were no vitals filed for this visit.  Lipid Panel     Component Value Date/Time   CHOL 156 12/24/2020 1144   TRIG 62 12/24/2020 1144   HDL 55 12/24/2020 1144   CHOLHDL 2.8 12/24/2020 1144   LDLCALC 89 12/24/2020 1144   Home fasting blood sugars: not checking  2 hour post-meal/random blood sugars: not checking.  Clinical Atherosclerotic Cardiovascular Disease (ASCVD): No  The 10-year ASCVD risk score Mikey Bussing DC Jr., et al., 2013) is: 19.3%   Values used to calculate the score:     Age: 55 years     Sex: Male     Is Non-Hispanic African American: Yes     Diabetic: Yes     Tobacco smoker: Yes      Systolic Blood Pressure: 010 mmHg     Is BP treated: No     HDL Cholesterol: 55 mg/dL     Total Cholesterol: 156 mg/dL    A/P: Diabetes longstanding currently uncontrolled. Complicated by alcohol use disorder. Patient is able to verbalize appropriate hypoglycemia management plan. Medication adherence denied. Control is suboptimal due to medication nonadherence, dietary indiscretion, alcohol abuse, and sedentary lifestyle. Pt did have glucose of 406, urine was tested and came back with trace ketones. Coordinated with his PCP and she instructed to send him to the ED due to risk for DKA. No changes were made today due to non-adherence. -Continue lantus 28 units daily -Continue metformin 1000 mg BID  -Extensively discussed pathophysiology of diabetes, recommended lifestyle interventions, dietary effects on blood sugar control -Counseled on s/sx of and management of hypoglycemia -Next A1C anticipated 03/2021.   Written patient instructions provided. Total time in face to face counseling 30 minutes.  Follow up Pharmacist Clinic Visit in 41.     Benard Halsted, PharmD, [facility], [place]  Cheree Ditto, PharmD Candidate [employer] Class of 2024

## 2021-02-21 ENCOUNTER — Other Ambulatory Visit: Payer: Self-pay

## 2021-02-21 MED ORDER — TRUE METRIX METER W/DEVICE KIT: 1.0000 | PACK | Freq: Every day | 0 refills | Status: DC

## 2021-02-21 MED ORDER — TRAZODONE HCL 50 MG PO TABS: 50.0000 mg | ORAL_TABLET | Freq: Every day | ORAL | 2 refills | Status: DC

## 2021-02-21 MED ORDER — SERTRALINE HCL 50 MG PO TABS
50.0000 mg | ORAL_TABLET | Freq: Every day | ORAL | 2 refills | Status: DC
Start: 2020-12-24 — End: 2021-03-15

## 2021-02-23 ENCOUNTER — Encounter: Payer: BLUE CROSS/BLUE SHIELD | Admitting: Family

## 2021-02-23 NOTE — Progress Notes (Signed)
Patient did not show for appointment.   

## 2021-02-26 ENCOUNTER — Ambulatory Visit: Payer: Medicaid Other | Admitting: Pharmacist

## 2021-03-09 ENCOUNTER — Other Ambulatory Visit: Payer: Self-pay

## 2021-03-09 ENCOUNTER — Ambulatory Visit (HOSPITAL_COMMUNITY)
Admission: EM | Admit: 2021-03-09 | Discharge: 2021-03-10 | Disposition: A | Discharge status: Other Acute Inpatient Hospital | Payer: BLUE CROSS/BLUE SHIELD | Attending: Nurse Practitioner | Admitting: Nurse Practitioner

## 2021-03-09 ENCOUNTER — Emergency Department (HOSPITAL_BASED_OUTPATIENT_CLINIC_OR_DEPARTMENT_OTHER): Payer: BLUE CROSS/BLUE SHIELD | Admitting: Radiology

## 2021-03-09 ENCOUNTER — Encounter (HOSPITAL_BASED_OUTPATIENT_CLINIC_OR_DEPARTMENT_OTHER): Payer: Self-pay | Admitting: Obstetrics and Gynecology

## 2021-03-09 ENCOUNTER — Emergency Department (HOSPITAL_BASED_OUTPATIENT_CLINIC_OR_DEPARTMENT_OTHER)
Admission: EM | Admit: 2021-03-09 | Discharge: 2021-03-09 | Disposition: A | Discharge status: Other Acute Inpatient Hospital | Payer: BLUE CROSS/BLUE SHIELD | Source: Home / Self Care | Attending: Emergency Medicine | Admitting: Emergency Medicine

## 2021-03-09 ENCOUNTER — Ambulatory Visit
Admission: EM | Admit: 2021-03-09 | Discharge: 2021-03-09 | Disposition: A | Discharge status: Home / Self Care | Payer: BLUE CROSS/BLUE SHIELD

## 2021-03-09 DIAGNOSIS — F1020 Alcohol dependence, uncomplicated: Secondary | ICD-10-CM

## 2021-03-09 DIAGNOSIS — F10930 Alcohol use, unspecified with withdrawal, uncomplicated: Secondary | ICD-10-CM

## 2021-03-09 DIAGNOSIS — R45851 Suicidal ideations: Secondary | ICD-10-CM | POA: Insufficient documentation

## 2021-03-09 DIAGNOSIS — F332 Major depressive disorder, recurrent severe without psychotic features: Secondary | ICD-10-CM

## 2021-03-09 DIAGNOSIS — F10230 Alcohol dependence with withdrawal, uncomplicated: Secondary | ICD-10-CM

## 2021-03-09 DIAGNOSIS — F411 Generalized anxiety disorder: Secondary | ICD-10-CM | POA: Insufficient documentation

## 2021-03-09 LAB — ACETAMINOPHEN LEVEL: Acetaminophen (Tylenol), Serum: <10 ug/mL — ABNORMAL LOW (ref 10–30)

## 2021-03-09 LAB — COMPREHENSIVE METABOLIC PANEL
ALT: 23 U/L (ref 0–44)
AST: 19 U/L (ref 15–41)
Albumin: 4.2 g/dL (ref 3.5–5.0)
Alkaline Phosphatase: 67 U/L (ref 38–126)
Anion gap: 10 (ref 5–15)
BUN: 11 mg/dL (ref 6–20)
CO2: 28 mmol/L (ref 22–32)
Calcium: 9.9 mg/dL (ref 8.9–10.3)
Chloride: 100 mmol/L (ref 98–111)
Creatinine, Ser: 0.59 mg/dL — ABNORMAL LOW (ref 0.61–1.24)
GFR, Estimated: >60 mL/min (ref >60)
Glucose, Bld: 237 mg/dL — ABNORMAL HIGH (ref 70–99)
Potassium: 4 mmol/L (ref 3.5–5.1)
Sodium: 138 mmol/L (ref 135–145)
Total Bilirubin: 0.7 mg/dL (ref 0.3–1.2)
Total Protein: 7.5 g/dL (ref 6.5–8.1)

## 2021-03-09 LAB — RAPID URINE DRUG SCREEN, HOSP PERFORMED
Amphetamines: NOT DETECTED
Barbiturates: NOT DETECTED
Benzodiazepines: NOT DETECTED
Cocaine: NOT DETECTED
Opiates: NOT DETECTED
Tetrahydrocannabinol: NOT DETECTED

## 2021-03-09 LAB — URINALYSIS, ROUTINE W REFLEX MICROSCOPIC
Bilirubin Urine: NEGATIVE
Glucose, UA: >1000 mg/dL — AB
Hgb urine dipstick: NEGATIVE
Ketones, ur: 40 mg/dL — AB
Leukocytes,Ua: NEGATIVE
Nitrite: NEGATIVE
Protein, ur: 30 mg/dL — AB
Specific Gravity, Urine: 1.027 (ref 1.005–1.030)
pH: 7.5 (ref 5.0–8.0)

## 2021-03-09 LAB — CBC WITH DIFFERENTIAL/PLATELET
Abs Immature Granulocytes: 0.03 10*3/uL (ref 0.00–0.07)
Basophils Absolute: 0 10*3/uL (ref 0.0–0.1)
Basophils Relative: 0 %
Eosinophils Absolute: 0 10*3/uL (ref 0.0–0.5)
Eosinophils Relative: 0 %
HCT: 46.2 % (ref 39.0–52.0)
Hemoglobin: 15.2 g/dL (ref 13.0–17.0)
Immature Granulocytes: 0 %
Lymphocytes Relative: 17 %
Lymphs Abs: 1.5 10*3/uL (ref 0.7–4.0)
MCH: 31.7 pg (ref 26.0–34.0)
MCHC: 32.9 g/dL (ref 30.0–36.0)
MCV: 96.5 fL (ref 80.0–100.0)
Monocytes Absolute: 0.5 10*3/uL (ref 0.1–1.0)
Monocytes Relative: 5 %
Neutro Abs: 6.7 10*3/uL (ref 1.7–7.7)
Neutrophils Relative %: 78 %
Platelets: 216 10*3/uL (ref 150–400)
RBC: 4.79 MIL/uL (ref 4.22–5.81)
RDW: 13.2 % (ref 11.5–15.5)
WBC: 8.7 10*3/uL (ref 4.0–10.5)
nRBC: 0 % (ref 0.0–0.2)

## 2021-03-09 LAB — CBG MONITORING, ED
Glucose-Capillary: 298 mg/dL — ABNORMAL HIGH (ref 70–99)
Glucose-Capillary: 103 mg/dL — ABNORMAL HIGH (ref 70–99)

## 2021-03-09 LAB — RESP PANEL BY RT-PCR (FLU A&B, COVID) ARPGX2
Influenza A by PCR: NEGATIVE
Influenza B by PCR: NEGATIVE
SARS Coronavirus 2 by RT PCR: NEGATIVE

## 2021-03-09 LAB — HEMOGLOBIN A1C
Hgb A1c MFr Bld: 9.3 % — ABNORMAL HIGH (ref 4.8–5.6)
Mean Plasma Glucose: 220.21 mg/dL

## 2021-03-09 LAB — LIPASE, BLOOD: Lipase: 49 U/L (ref 11–51)

## 2021-03-09 LAB — TROPONIN I (HIGH SENSITIVITY): Troponin I (High Sensitivity): 4 ng/L (ref <18)

## 2021-03-09 LAB — ETHANOL: Alcohol, Ethyl (B): <10 mg/dL (ref <10)

## 2021-03-09 LAB — SALICYLATE LEVEL: Salicylate Lvl: <7 mg/dL — ABNORMAL LOW (ref 7.0–30.0)

## 2021-03-09 MED ORDER — THIAMINE HCL 100 MG/ML IJ SOLN
100.0000 mg | Freq: Every day | INTRAMUSCULAR | Status: DC
  Filled 2021-03-09: qty 2

## 2021-03-09 MED ORDER — INSULIN ASPART 100 UNIT/ML ~~LOC~~ SOLN
0.0000 [IU] | Freq: Three times a day (TID) | SUBCUTANEOUS | Status: DC
  Administered 2021-03-10: 2 [IU] via SUBCUTANEOUS
  Administered 2021-03-10: 3 [IU] via SUBCUTANEOUS
  Administered 2021-03-10: 2 [IU] via SUBCUTANEOUS

## 2021-03-09 MED ORDER — VITAMIN D (ERGOCALCIFEROL) 1.25 MG (50000 UNIT) PO CAPS
50000.0000 [IU] | ORAL_CAPSULE | ORAL | Status: DC
  Administered 2021-03-10: 50000 [IU] via ORAL
  Filled 2021-03-09: qty 1

## 2021-03-09 MED ORDER — PANTOPRAZOLE SODIUM 40 MG PO TBEC
40.0000 mg | DELAYED_RELEASE_TABLET | Freq: Every day | ORAL | Status: DC
  Administered 2021-03-09: 40 mg via ORAL
  Filled 2021-03-09: qty 1

## 2021-03-09 MED ORDER — METFORMIN HCL 500 MG PO TABS: 1000.0000 mg | ORAL_TABLET | Freq: Two times a day (BID) | ORAL | Status: DC

## 2021-03-09 MED ORDER — INSULIN ASPART 100 UNIT/ML ~~LOC~~ SOLN: 0.0000 [IU] | Freq: Every day | SUBCUTANEOUS | Status: DC

## 2021-03-09 MED ORDER — ACETAMINOPHEN 325 MG PO TABS: 650.0000 mg | ORAL_TABLET | ORAL | Status: DC | PRN

## 2021-03-09 MED ORDER — ACETAMINOPHEN 325 MG PO TABS
650.0000 mg | ORAL_TABLET | Freq: Four times a day (QID) | ORAL | Status: DC | PRN
  Administered 2021-03-10: 650 mg via ORAL
  Filled 2021-03-09: qty 2

## 2021-03-09 MED ORDER — LORAZEPAM 1 MG PO TABS
0.0000 mg | ORAL_TABLET | Freq: Four times a day (QID) | ORAL | Status: DC
  Administered 2021-03-09: 2 mg via ORAL
  Filled 2021-03-09: qty 2

## 2021-03-09 MED ORDER — SODIUM CHLORIDE 0.9 % IV BOLUS
1000.0000 mL | Freq: Once | INTRAVENOUS | Status: AC
  Administered 2021-03-09: 1000 mL via INTRAVENOUS

## 2021-03-09 MED ORDER — METFORMIN HCL 500 MG PO TABS
1000.0000 mg | ORAL_TABLET | Freq: Two times a day (BID) | ORAL | Status: DC
  Administered 2021-03-10 (×2): 1000 mg via ORAL
  Filled 2021-03-09 (×3): qty 2

## 2021-03-09 MED ORDER — ALUM & MAG HYDROXIDE-SIMETH 200-200-20 MG/5ML PO SUSP: 30.0000 mL | ORAL | Status: DC | PRN

## 2021-03-09 MED ORDER — INSULIN ASPART 100 UNIT/ML ~~LOC~~ SOLN
0.0000 [IU] | Freq: Three times a day (TID) | SUBCUTANEOUS | Status: DC
  Administered 2021-03-09: 5 [IU] via SUBCUTANEOUS

## 2021-03-09 MED ORDER — INSULIN ASPART 100 UNIT/ML ~~LOC~~ SOLN
3.0000 [IU] | Freq: Three times a day (TID) | SUBCUTANEOUS | Status: DC
  Administered 2021-03-09: 3 [IU] via SUBCUTANEOUS

## 2021-03-09 MED ORDER — ADULT MULTIVITAMIN W/MINERALS CH
1.0000 | ORAL_TABLET | Freq: Every day | ORAL | Status: DC
  Administered 2021-03-10: 1 via ORAL
  Filled 2021-03-09 (×2): qty 1

## 2021-03-09 MED ORDER — LORAZEPAM 2 MG/ML IJ SOLN: 0.0000 mg | Freq: Two times a day (BID) | INTRAMUSCULAR | Status: DC

## 2021-03-09 MED ORDER — LORAZEPAM 2 MG/ML IJ SOLN: 0.0000 mg | Freq: Four times a day (QID) | INTRAMUSCULAR | Status: DC

## 2021-03-09 MED ORDER — HYDROXYZINE HCL 25 MG PO TABS
25.0000 mg | ORAL_TABLET | Freq: Four times a day (QID) | ORAL | Status: DC | PRN
  Administered 2021-03-10: 25 mg via ORAL
  Filled 2021-03-09: qty 1

## 2021-03-09 MED ORDER — GABAPENTIN 300 MG PO CAPS
300.0000 mg | ORAL_CAPSULE | Freq: Three times a day (TID) | ORAL | Status: DC
  Administered 2021-03-10 (×2): 300 mg via ORAL
  Filled 2021-03-09 (×3): qty 1

## 2021-03-09 MED ORDER — THIAMINE HCL 100 MG PO TABS
100.0000 mg | ORAL_TABLET | Freq: Every day | ORAL | Status: DC
  Administered 2021-03-10: 100 mg via ORAL
  Filled 2021-03-09 (×2): qty 1

## 2021-03-09 MED ORDER — ATORVASTATIN CALCIUM 40 MG PO TABS: 40.0000 mg | ORAL_TABLET | Freq: Every day | ORAL | Status: DC

## 2021-03-09 MED ORDER — ESCITALOPRAM OXALATE 5 MG PO TABS: 5.0000 mg | ORAL_TABLET | Freq: Every day | ORAL | Status: DC

## 2021-03-09 MED ORDER — LORAZEPAM 1 MG PO TABS
1.0000 mg | ORAL_TABLET | Freq: Four times a day (QID) | ORAL | Status: DC | PRN
Start: 2021-03-09 — End: 2021-03-10

## 2021-03-09 MED ORDER — ASPIRIN EC 81 MG PO TBEC
81.0000 mg | DELAYED_RELEASE_TABLET | Freq: Every day | ORAL | Status: DC
  Administered 2021-03-10: 81 mg via ORAL
  Filled 2021-03-09 (×2): qty 1

## 2021-03-09 MED ORDER — THIAMINE HCL 100 MG PO TABS
100.0000 mg | ORAL_TABLET | Freq: Every day | ORAL | Status: DC
  Filled 2021-03-09: qty 1

## 2021-03-09 MED ORDER — GABAPENTIN 100 MG PO CAPS: 100.0000 mg | ORAL_CAPSULE | Freq: Every day | ORAL | Status: DC

## 2021-03-09 MED ORDER — MAGNESIUM HYDROXIDE 400 MG/5ML PO SUSP: 30.0000 mL | Freq: Every day | ORAL | Status: DC | PRN

## 2021-03-09 MED ORDER — TRAZODONE HCL 50 MG PO TABS: 50.0000 mg | ORAL_TABLET | Freq: Every day | ORAL | Status: DC

## 2021-03-09 MED ORDER — LORAZEPAM 1 MG PO TABS: 0.0000 mg | ORAL_TABLET | Freq: Two times a day (BID) | ORAL | Status: DC

## 2021-03-09 MED ORDER — METFORMIN HCL 500 MG PO TABS
1000.0000 mg | ORAL_TABLET | Freq: Two times a day (BID) | ORAL | Status: DC
  Administered 2021-03-09: 1000 mg via ORAL
  Filled 2021-03-09: qty 2

## 2021-03-09 MED ORDER — ASPIRIN EC 81 MG PO TBEC
81.0000 mg | DELAYED_RELEASE_TABLET | Freq: Every day | ORAL | Status: DC
  Administered 2021-03-09: 81 mg via ORAL
  Filled 2021-03-09: qty 1

## 2021-03-09 MED ORDER — PANTOPRAZOLE SODIUM 40 MG PO TBEC
40.0000 mg | DELAYED_RELEASE_TABLET | Freq: Every day | ORAL | Status: DC
  Administered 2021-03-10: 40 mg via ORAL
  Filled 2021-03-09 (×2): qty 1

## 2021-03-09 MED ORDER — LOPERAMIDE HCL 2 MG PO CAPS: 2.0000 mg | ORAL_CAPSULE | ORAL | Status: DC | PRN

## 2021-03-09 MED ORDER — INSULIN GLARGINE 100 UNIT/ML ~~LOC~~ SOLN
28.0000 [IU] | Freq: Every day | SUBCUTANEOUS | Status: DC
  Administered 2021-03-10: 28 [IU] via SUBCUTANEOUS

## 2021-03-09 MED ORDER — ONDANSETRON 4 MG PO TBDP: 4.0000 mg | ORAL_TABLET | Freq: Four times a day (QID) | ORAL | Status: DC | PRN

## 2021-03-09 MED ORDER — ONDANSETRON HCL 4 MG/2ML IJ SOLN
4.0000 mg | Freq: Once | INTRAMUSCULAR | Status: AC
  Administered 2021-03-09: 4 mg via INTRAVENOUS
  Filled 2021-03-09: qty 2
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
## 2021-03-09 NOTE — ED Notes (Signed)
Informed ED Provider that he did have SI thoughts yesterday and has had in the past and does have a plan in place

## 2021-03-09 NOTE — ED Provider Notes (Signed)
Patient has been evaluated by behavioral health.  Blood sugar has stabilized.  Derrek Monaco recommends transfer to be held for overnight observation and reevaluation by provider tomorrow morning.  Patient and spouse are happy with this plan.  Patient stable at time of transfer.   Lorelle Gibbs, DO 03/09/21 2131

## 2021-03-09 NOTE — ED Provider Notes (Signed)
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 Prostate Specific Ag, Serum 12/24/2020 0.7  0.0 - 4.0 ng/mL Final   Comment: Roche ECLIA methodology. According to the American Urological Association, Serum PSA should decrease and remain at undetectable levels after radical prostatectomy. The AUA defines biochemical recurrence as an  initial PSA value 0.2 ng/mL or greater followed by a subsequent confirmatory PSA value 0.2 ng/mL or greater. Values obtained with different assay methods or kits cannot be used interchangeably. Results cannot be interpreted as absolute evidence of the presence or absence of malignant disease.   . Vit D, 25-Hydroxy 12/24/2020 6.6* 30.0 - 100.0 ng/mL Final   Comment: Vitamin D deficiency has been defined by the [place] practice guideline as a level of serum 25-OH vitamin D less than 20 ng/mL (1,2). The Endocrine Society went on to further define vitamin D insufficiency as a level between 21 and 29 ng/mL (2). 1. IOM (Institute of Medicine). 2010. Dietary reference    intakes for calcium and D. [place]: The    [employer]. 2. Holick MF, Binkley Mel[place], Bischoff-Ferrari HA, et al.    Evaluation, treatment, and prevention of vitamin D    deficiency: an Endocrine Society clinical practice    guideline. JCEM. 2011 Jul; 96(7):1911-30.     Allergies: Penicillins  PTA Medications: (Not in a hospital admission)   Medical Decision Making  Patient was medically cleared in the ED  Reviewed TSH 0.953 and Lipid Panel WNLs on 12/24/2020. Hgb A1C 9. 3 on 03/09/2021, which has improved from previous.   Discussed risks/benefits and side effects of lexapro.  Patient is in agreement with starting Lexapro.  Patient takes gabapentin 100 mg daily for neuropathy.  Plan to increase to 300 mg 3 times daily to address alcohol use disorder.  Start Lexapro 5 mg daily for depression, first dose tonight. Increase gabapentin to 300 mg 3 times daily for alcohol use disorder and neuropathy Continue Glucophage 1000 mg twice daily for diabetes mellitus Continue Lantus 28 units daily for diabetes mellitus Continue Protonix 40 mg daily for GERD Continue trazodone 50 mg nightly as needed for sleep Continue hydroxyzine 25 mg as needed for anxiety  NovoLog sliding  scale insulin 0 to 60 units 3 times daily with meals  Monitor CBGs before meals and at bedtime  Initiate CIWA protocol -lorazepam 1 mg every 6 hours prn for CIWA >10 -thiamine 100 mg daily for nutritional supplementation -ondansetron 4 mg ODT every 6 hours prn nausea/vomiting -loperamide 2-4 mg capsule prn diarrhea or loose stools    Recommendations  Based on my evaluation the patient does not appear to have an emergency medical condition.  Rozetta Nunnery, NP 03/10/21  4:40  AM

## 2021-03-09 NOTE — ED Notes (Signed)
RN reached out to TTS and was told that patient had multiple people ahead of him for TTS assessment.

## 2021-03-09 NOTE — ED Notes (Signed)
Patient given frozen dinner tray and drink.

## 2021-03-09 NOTE — ED Notes (Signed)
Pt is calm and cooperative. Answers questions appropriately. Sister at bedside. ED Provider aware of CIWA score

## 2021-03-09 NOTE — ED Notes (Signed)
Pt's CBG result was 298. Informed Kaitlin - RN.

## 2021-03-09 NOTE — ED Notes (Signed)
[facility] Called to transport patient to [facility].

## 2021-03-09 NOTE — BH Assessment (Signed)
Sent nursing Fara Chute, RN) a secure chat requesting TTS machine to be placed in patients room for initial assessment.

## 2021-03-09 NOTE — ED Notes (Signed)
TTS assessing patient at this time.

## 2021-03-09 NOTE — ED Notes (Signed)
[facility] phoned re: TTS request, Unit Secretary stated they were aware. Currently awaiting call from [place]. Charge RN aware

## 2021-03-09 NOTE — ED Notes (Signed)
States had an anxiety attack yesterday, yesterday am was last ETOH drink , having some abdominal pain, which began last PM

## 2021-03-09 NOTE — ED Triage Notes (Signed)
Patient reports to the ER for Withdrawal and anxiety. Patient reports last drink was yesterday around 10:30am. Patient reports shakes and feeling SI at home. Patient reports he has been drinking for 12-13 years at current level. Patient states he took trazodone to help him sleep but is still having severe anxiety.

## 2021-03-09 NOTE — ED Notes (Signed)
Patient assisted to [facility]. Paperwork given to driver. Patient to the vehicle without incident
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
## 2021-03-10 ENCOUNTER — Inpatient Hospital Stay (HOSPITAL_COMMUNITY)
Admission: AD | Admit: 2021-03-10 | Discharge: 2021-03-15 | DRG: 885 | Disposition: A | Discharge status: Home / Self Care | Payer: BLUE CROSS/BLUE SHIELD | Source: Intra-hospital | Attending: Psychiatry | Admitting: Psychiatry

## 2021-03-10 DIAGNOSIS — F1020 Alcohol dependence, uncomplicated: Secondary | ICD-10-CM | POA: Diagnosis not present

## 2021-03-10 DIAGNOSIS — G4700 Insomnia, unspecified: Secondary | ICD-10-CM | POA: Diagnosis present

## 2021-03-10 DIAGNOSIS — Z794 Long term (current) use of insulin: Secondary | ICD-10-CM

## 2021-03-10 DIAGNOSIS — F17210 Nicotine dependence, cigarettes, uncomplicated: Secondary | ICD-10-CM | POA: Diagnosis present

## 2021-03-10 DIAGNOSIS — F322 Major depressive disorder, single episode, severe without psychotic features: Principal | ICD-10-CM | POA: Diagnosis present

## 2021-03-10 DIAGNOSIS — E119 Type 2 diabetes mellitus without complications: Secondary | ICD-10-CM | POA: Diagnosis present

## 2021-03-10 DIAGNOSIS — F410 Panic disorder [episodic paroxysmal anxiety] without agoraphobia: Secondary | ICD-10-CM | POA: Diagnosis present

## 2021-03-10 DIAGNOSIS — K219 Gastro-esophageal reflux disease without esophagitis: Secondary | ICD-10-CM | POA: Diagnosis present

## 2021-03-10 DIAGNOSIS — E1169 Type 2 diabetes mellitus with other specified complication: Secondary | ICD-10-CM

## 2021-03-10 DIAGNOSIS — F411 Generalized anxiety disorder: Secondary | ICD-10-CM | POA: Diagnosis present

## 2021-03-10 DIAGNOSIS — F332 Major depressive disorder, recurrent severe without psychotic features: Secondary | ICD-10-CM | POA: Diagnosis not present

## 2021-03-10 DIAGNOSIS — R45851 Suicidal ideations: Secondary | ICD-10-CM | POA: Diagnosis present

## 2021-03-10 LAB — GLUCOSE, CAPILLARY
Glucose-Capillary: 241 mg/dL — ABNORMAL HIGH (ref 70–99)
Glucose-Capillary: 236 mg/dL — ABNORMAL HIGH (ref 70–99)
Glucose-Capillary: 261 mg/dL — ABNORMAL HIGH (ref 70–99)

## 2021-03-10 MED ORDER — ESCITALOPRAM OXALATE 10 MG PO TABS
10.0000 mg | ORAL_TABLET | Freq: Every day | ORAL | Status: DC
  Administered 2021-03-11 – 2021-03-12 (×2): 10 mg via ORAL
  Filled 2021-03-10 (×3): qty 1

## 2021-03-10 MED ORDER — HYDROXYZINE HCL 25 MG PO TABS
25.0000 mg | ORAL_TABLET | Freq: Four times a day (QID) | ORAL | Status: AC | PRN
  Administered 2021-03-10 – 2021-03-13 (×5): 25 mg via ORAL
  Filled 2021-03-10 (×6): qty 1

## 2021-03-10 MED ORDER — PANTOPRAZOLE SODIUM 40 MG PO TBEC
40.0000 mg | DELAYED_RELEASE_TABLET | Freq: Every day | ORAL | Status: DC
  Administered 2021-03-11 – 2021-03-15 (×5): 40 mg via ORAL
  Filled 2021-03-10 (×8): qty 1

## 2021-03-10 MED ORDER — NICOTINE 21 MG/24HR TD PT24
21.0000 mg | MEDICATED_PATCH | Freq: Every day | TRANSDERMAL | Status: DC
  Administered 2021-03-11 – 2021-03-15 (×5): 21 mg via TRANSDERMAL
  Filled 2021-03-10 (×8): qty 1

## 2021-03-10 MED ORDER — TRAZODONE HCL 50 MG PO TABS
50.0000 mg | ORAL_TABLET | Freq: Every evening | ORAL | Status: DC | PRN
  Administered 2021-03-10 – 2021-03-14 (×5): 50 mg via ORAL
  Filled 2021-03-10 (×5): qty 1

## 2021-03-10 MED ORDER — THIAMINE HCL 100 MG/ML IJ SOLN: 100.0000 mg | Freq: Once | INTRAMUSCULAR | Status: DC

## 2021-03-10 MED ORDER — ADULT MULTIVITAMIN W/MINERALS CH
1.0000 | ORAL_TABLET | Freq: Every day | ORAL | Status: DC
  Administered 2021-03-11 – 2021-03-15 (×5): 1 via ORAL
  Filled 2021-03-10 (×8): qty 1

## 2021-03-10 MED ORDER — ESCITALOPRAM OXALATE 10 MG PO TABS
10.0000 mg | ORAL_TABLET | Freq: Every day | ORAL | Status: DC
  Administered 2021-03-10: 10 mg via ORAL
  Filled 2021-03-10 (×2): qty 1

## 2021-03-10 MED ORDER — ASPIRIN EC 81 MG PO TBEC
81.0000 mg | DELAYED_RELEASE_TABLET | Freq: Every day | ORAL | Status: DC
  Administered 2021-03-11 – 2021-03-15 (×5): 81 mg via ORAL
  Filled 2021-03-10 (×8): qty 1

## 2021-03-10 MED ORDER — THIAMINE HCL 100 MG PO TABS
100.0000 mg | ORAL_TABLET | Freq: Every day | ORAL | Status: DC
  Administered 2021-03-11 – 2021-03-15 (×5): 100 mg via ORAL
  Filled 2021-03-10 (×8): qty 1

## 2021-03-10 MED ORDER — LOPERAMIDE HCL 2 MG PO CAPS: 2.0000 mg | ORAL_CAPSULE | ORAL | Status: AC | PRN

## 2021-03-10 MED ORDER — ALUM & MAG HYDROXIDE-SIMETH 200-200-20 MG/5ML PO SUSP: 30.0000 mL | ORAL | Status: DC | PRN

## 2021-03-10 MED ORDER — LORAZEPAM 1 MG PO TABS: 1.0000 mg | ORAL_TABLET | Freq: Four times a day (QID) | ORAL | Status: AC | PRN

## 2021-03-10 MED ORDER — GABAPENTIN 300 MG PO CAPS
300.0000 mg | ORAL_CAPSULE | Freq: Three times a day (TID) | ORAL | Status: DC
  Administered 2021-03-11 – 2021-03-13 (×7): 300 mg via ORAL
  Filled 2021-03-10 (×10): qty 1

## 2021-03-10 MED ORDER — ATORVASTATIN CALCIUM 40 MG PO TABS
40.0000 mg | ORAL_TABLET | Freq: Every day | ORAL | Status: DC
  Administered 2021-03-11 – 2021-03-15 (×5): 40 mg via ORAL
  Filled 2021-03-10 (×8): qty 1

## 2021-03-10 MED ORDER — TRAZODONE HCL 50 MG PO TABS: 50.0000 mg | ORAL_TABLET | Freq: Every evening | ORAL | Status: DC | PRN

## 2021-03-10 MED ORDER — ESCITALOPRAM OXALATE 10 MG PO TABS: 10.0000 mg | ORAL_TABLET | Freq: Every day | ORAL | Status: DC

## 2021-03-10 MED ORDER — NICOTINE 21 MG/24HR TD PT24
21.0000 mg | MEDICATED_PATCH | Freq: Once | TRANSDERMAL | Status: DC
  Administered 2021-03-10: 21 mg via TRANSDERMAL
  Filled 2021-03-10: qty 1

## 2021-03-10 MED ORDER — ONDANSETRON 4 MG PO TBDP: 4.0000 mg | ORAL_TABLET | Freq: Four times a day (QID) | ORAL | Status: AC | PRN

## 2021-03-10 MED ORDER — FOLIC ACID 1 MG PO TABS
1.0000 mg | ORAL_TABLET | Freq: Every day | ORAL | Status: DC
  Administered 2021-03-10: 1 mg via ORAL
  Filled 2021-03-10 (×2): qty 1

## 2021-03-10 MED ORDER — MAGNESIUM HYDROXIDE 400 MG/5ML PO SUSP: 30.0000 mL | Freq: Every day | ORAL | Status: DC | PRN

## 2021-03-10 MED ORDER — METFORMIN HCL 500 MG PO TABS
1000.0000 mg | ORAL_TABLET | Freq: Two times a day (BID) | ORAL | Status: DC
  Administered 2021-03-11 – 2021-03-15 (×9): 1000 mg via ORAL
  Filled 2021-03-10 (×14): qty 2

## 2021-03-10 MED ORDER — INSULIN ASPART 100 UNIT/ML ~~LOC~~ SOLN
0.0000 [IU] | Freq: Three times a day (TID) | SUBCUTANEOUS | Status: DC
  Administered 2021-03-11: 3 [IU] via SUBCUTANEOUS
  Administered 2021-03-11: 2 [IU] via SUBCUTANEOUS
  Administered 2021-03-11: 1 [IU] via SUBCUTANEOUS
  Administered 2021-03-12: 2 [IU] via SUBCUTANEOUS
  Administered 2021-03-12: 4 [IU] via SUBCUTANEOUS
  Administered 2021-03-12 – 2021-03-13 (×2): 1 [IU] via SUBCUTANEOUS
  Administered 2021-03-13: 2 [IU] via SUBCUTANEOUS
  Administered 2021-03-14: 4 [IU] via SUBCUTANEOUS
  Administered 2021-03-14: 2 [IU] via SUBCUTANEOUS
  Administered 2021-03-14 – 2021-03-15 (×2): 1 [IU] via SUBCUTANEOUS

## 2021-03-10 MED ORDER — ADULT MULTIVITAMIN W/MINERALS CH: 1.0000 | ORAL_TABLET | Freq: Every day | ORAL | Status: DC

## 2021-03-10 MED ORDER — ACETAMINOPHEN 325 MG PO TABS
650.0000 mg | ORAL_TABLET | Freq: Four times a day (QID) | ORAL | Status: DC | PRN
  Administered 2021-03-11: 650 mg via ORAL
  Filled 2021-03-10: qty 2
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
## 2021-03-10 NOTE — Progress Notes (Signed)
Pt is alert and oriented. No signs of acute distress noted. Administered meds with no incident. Pt denies pain, SI, HI and AVH at this time. Staff will monitor for pt's safety.

## 2021-03-10 NOTE — ED Provider Notes (Signed)
Behavioral Health Progress Note  Date and Time: 03/10/2021 10:58 AM Name: Samuel [name] MRN:  [id number]  Subjective: Patient evaluated at bedside this morning.  He admits to active suicidal ideations and unable to contract for safety.  He states that he does not have a concrete plan but he would hurt himself with anything he can get his hands on.  He is endorsing depressed mood, difficulty sleeping at night, poor appetite, hopelessness, worthlessness.  He states he lives with his sister and has positive support system and would not want to hurt himself in front of the sister or at her home.  He states he recently started a job but he is unable to concentrate there and does not have energy to complete his job.  Diagnosis:  Final diagnoses:  Severe recurrent major depression without psychotic features (HCC)  Alcohol use disorder, severe, dependence ([place])    Total Time spent with patient: 30 minutes  Past Psychiatric History: Major depressive disorder, generalized anxiety disorder, alcohol use disorder, polysubstance abuse disorder Past Medical History:  Past Medical History:  Diagnosis Date  . Diabetes mellitus without complication ([place])   . Pancreatitis unk   No past surgical history on file. Family History: No family history on file. Family Psychiatric  History: Not pertinent Social History:  Social History   Substance and Sexual Activity  Alcohol Use Yes  . Alcohol/week: 50.0 standard drinks  . Types: 50 Cans of beer per week   Comment: last thursday - a couple of beers     Social History   Substance and Sexual Activity  Drug Use No    Social History   Socioeconomic History  . Marital status: Single    Spouse name: Not on file  . Number of children: Not on file  . Years of education: Not on file  . Highest education level: Not on file  Occupational History  . Not on file  Tobacco Use  . Smoking status: Current Every Day Smoker    Packs/day: 1.00    Types:  Cigarettes  . Smokeless tobacco: Never Used  Substance and Sexual Activity  . Alcohol use: Yes    Alcohol/week: 50.0 standard drinks    Types: 50 Cans of beer per week    Comment: last thursday - a couple of beers  . Drug use: No  . Sexual activity: Yes  Other Topics Concern  . Not on file  Social History Narrative  . Not on file   Social Determinants of Health   Financial Resource Strain: Not on file  Food Insecurity: Not on file  Transportation Needs: Not on file  Physical Activity: Not on file  Stress: Not on file  Social Connections: Not on file   SDOH:  SDOH Screenings   Alcohol Screen: Not on file  Depression (PHQ2-9): Medium Risk  . PHQ-2 Score: 11  Financial Resource Strain: Not on file  Food Insecurity: Not on file  Housing: Not on file  Physical Activity: Not on file  Social Connections: Not on file  Stress: Not on file  Tobacco Use: High Risk  . Smoking Tobacco Use: Current Every Day Smoker  . Smokeless Tobacco Use: Never Used  Transportation Needs: Not on file   Additional Social History:                         Sleep: Fair  Appetite:  Fair  Current Medications:  Current Facility-Administered Medications  Medication Dose Route Frequency  Provider Last Rate Last Admin  . acetaminophen (TYLENOL) tablet 650 mg  650 mg Oral Q6H PRN Lindon Romp A, NP      . alum & mag hydroxide-simeth (MAALOX/MYLANTA) 200-200-20 MG/5ML suspension 30 mL  30 mL Oral Q4H PRN Lindon Romp A, NP      . aspirin EC tablet 81 mg  81 mg Oral Daily Lindon Romp A, NP   81 mg at 03/10/21 0830  . atorvastatin (LIPITOR) tablet 40 mg  40 mg Oral QHS Lindon Romp A, NP      . escitalopram (LEXAPRO) tablet 10 mg  10 mg Oral Daily Rhyan Radler, Meredith Staggers, MD   10 mg at 03/10/21 6004  . folic acid (FOLVITE) tablet 1 mg  1 mg Oral Daily Kayvan Hoefling, MD   1 mg at 03/10/21 5997  . gabapentin (NEURONTIN) capsule 300 mg  300 mg Oral TID Lindon Romp A, NP   300 mg at 03/10/21 0831  .  hydrOXYzine (ATARAX/VISTARIL) tablet 25 mg  25 mg Oral Q6H PRN Lindon Romp A, NP      . insulin aspart (novoLOG) injection 0-6 Units  0-6 Units Subcutaneous TID WC Rozetta Nunnery, NP   2 Units at 03/10/21 [phone]  . insulin glargine (LANTUS) injection 28 Units  28 Units Subcutaneous Daily Lindon Romp A, NP   28 Units at 03/10/21 0829  . loperamide (IMODIUM) capsule 2-4 mg  2-4 mg Oral PRN Lindon Romp A, NP      . LORazepam (ATIVAN) tablet 1 mg  1 mg Oral Q6H PRN Lindon Romp A, NP      . magnesium hydroxide (MILK OF MAGNESIA) suspension 30 mL  30 mL Oral Daily PRN Lindon Romp A, NP      . metFORMIN (GLUCOPHAGE) tablet 1,000 mg  1,000 mg Oral BID WC Lindon Romp A, NP   1,000 mg at 03/10/21 0831  . multivitamin with minerals tablet 1 tablet  1 tablet Oral Daily Lindon Romp A, NP   1 tablet at 03/10/21 0830  . nicotine (NICODERM CQ - dosed in mg/24 hours) patch 21 mg  21 mg Transdermal Once Loneta Tamplin, Meredith Staggers, MD   21 mg at 03/10/21 0832  . ondansetron (ZOFRAN-ODT) disintegrating tablet 4 mg  4 mg Oral Q6H PRN Lindon Romp A, NP      . pantoprazole (PROTONIX) EC tablet 40 mg  40 mg Oral Daily Lindon Romp A, NP   40 mg at 03/10/21 0830  . thiamine tablet 100 mg  100 mg Oral Daily Lindon Romp A, NP   100 mg at 03/10/21 0831  . traZODone (DESYREL) tablet 50 mg  50 mg Oral QHS PRN Tristyn Demarest, Meredith Staggers, MD      . Vitamin D (Ergocalciferol) (DRISDOL) capsule 50,000 Units  50,000 Units Oral Q7 days Rozetta Nunnery, NP   50,000 Units at 03/10/21 0945   Current Outpatient Medications  Medication Sig Dispense Refill  . aspirin EC 81 MG tablet Take 1 tablet (81 mg total) by mouth daily. 30 tablet 2  . atorvastatin (LIPITOR) 40 MG tablet TAKE 1 TABLET (40 MG TOTAL) BY MOUTH AT BEDTIME. 30 tablet 2  . gabapentin (NEURONTIN) 100 MG capsule Take 1 capsule (100 mg total) by mouth at bedtime. (Patient taking differently: Take 200 mg by mouth at bedtime.) 30 capsule 2  . gabapentin (NEURONTIN) 100 MG capsule TAKE 1 CAPSULE  (100 MG TOTAL) BY MOUTH AT BEDTIME. 30 capsule 2  . hydrOXYzine (ATARAX/VISTARIL) 25 MG tablet TAKE 1 TABLET (25  MG TOTAL) BY MOUTH EVERY 6 (SIX) HOURS AS NEEDED. 60 tablet 2  . insulin glargine (LANTUS) 100 UNIT/ML Solostar Pen INJECT 28 UNITS INTO THE SKIN DAILY. 15 mL 1  . Insulin Pen Needle 32G X 4 MM MISC 1 EACH BY DOES NOT APPLY ROUTE IN THE MORNING AND AT BEDTIME. 100 each 11  . metFORMIN (GLUCOPHAGE) 1000 MG tablet Take 1 tablet (1,000 mg total) by mouth 2 (two) times daily. 60 tablet 2  . Multiple Vitamin (MULTIVITAMIN WITH MINERALS) TABS tablet Take 1 tablet by mouth daily.    . pantoprazole (PROTONIX) 40 MG tablet TAKE 1 TABLET (40 MG TOTAL) BY MOUTH DAILY. 30 tablet 2  . sertraline (ZOLOFT) 50 MG tablet TAKE 1 TABLET (50 MG TOTAL) BY MOUTH DAILY. 30 tablet 2  . traZODone (DESYREL) 50 MG tablet TAKE 1 TABLET (50 MG TOTAL) BY MOUTH AT BEDTIME. 30 tablet 2  . TRUEplus Lancets 28G MISC USE AS INSTRUCTED 100 each 12    Labs  Lab Results:  Admission on 03/09/2021, Discharged on 03/09/2021  Component Date Value Ref Range Status  . WBC 03/09/2021 8.7  4.0 - 10.5 K/uL Final  . RBC 03/09/2021 4.79  4.22 - 5.81 MIL/uL Final  . Hemoglobin 03/09/2021 15.2  13.0 - 17.0 g/dL Final  . HCT 03/09/2021 46.2  39.0 - 52.0 % Final  . MCV 03/09/2021 96.5  80.0 - 100.0 fL Final  . MCH 03/09/2021 31.7  26.0 - 34.0 pg Final  . MCHC 03/09/2021 32.9  30.0 - 36.0 g/dL Final  . RDW 03/09/2021 13.2  11.5 - 15.5 % Final  . Platelets 03/09/2021 216  150 - 400 K/uL Final  . nRBC 03/09/2021 0.0  0.0 - 0.2 % Final  . Neutrophils Relative % 03/09/2021 78  % Final  . Neutro Abs 03/09/2021 6.7  1.7 - 7.7 K/uL Final  . Lymphocytes Relative 03/09/2021 17  % Final  . Lymphs Abs 03/09/2021 1.5  0.7 - 4.0 K/uL Final  . Monocytes Relative 03/09/2021 5  % Final  . Monocytes Absolute 03/09/2021 0.5  0.1 - 1.0 K/uL Final  . Eosinophils Relative 03/09/2021 0  % Final  . Eosinophils Absolute 03/09/2021 0.0  0.0 - 0.5  K/uL Final  . Basophils Relative 03/09/2021 0  % Final  . Basophils Absolute 03/09/2021 0.0  0.0 - 0.1 K/uL Final  . Immature Granulocytes 03/09/2021 0  % Final  . Abs Immature Granulocytes 03/09/2021 0.03  0.00 - 0.07 K/uL Final   Performed at [facility]  . Sodium 03/09/2021 138  135 - 145 mmol/L Final  . Potassium 03/09/2021 4.0  3.5 - 5.1 mmol/L Final  . Chloride 03/09/2021 100  98 - 111 mmol/L Final  . CO2 03/09/2021 28  22 - 32 mmol/L Final  . Glucose, Bld 03/09/2021 237* 70 - 99 mg/dL Final   Glucose reference range applies only to samples taken after fasting for at least 8 hours.  . BUN 03/09/2021 11  6 - 20 mg/dL Final  . Creatinine, Ser 03/09/2021 0.59* 0.61 - 1.24 mg/dL Final  . Calcium 03/09/2021 9.9  8.9 - 10.3 mg/dL Final  . Total Protein 03/09/2021 7.5  6.5 - 8.1 g/dL Final  . Albumin 03/09/2021 4.2  3.5 - 5.0 g/dL Final  . AST 03/09/2021 19  15 - 41 U/L Final  . ALT 03/09/2021 23  0 - 44 U/L Final  . Alkaline Phosphatase 03/09/2021 67  38 - 126 U/L Final  . Total  Bilirubin 03/09/2021 0.7  0.3 - 1.2 mg/dL Final  . GFR, Estimated 03/09/2021 >60  >60 mL/min Final   Comment: (NOTE) Calculated using the CKD-EPI Creatinine Equation (2021)   . Anion gap 03/09/2021 10  5 - 15 Final   Performed at [facility]  . Lipase 03/09/2021 49  11 - 51 U/L Final   Performed at [facility]  . Acetaminophen (Tylenol), Serum 03/09/2021 <10* 10 - 30 ug/mL Final   Comment: (NOTE) Therapeutic concentrations vary significantly. A range of 10-30 ug/mL  may be an effective concentration for many patients. However, some  are best treated at concentrations outside of this range. Acetaminophen concentrations >150 ug/mL at 4 hours after ingestion  and >50 ug/mL at 12 hours after ingestion are often associated with  toxic reactions.  Performed at [facility]   . Salicylate Lvl 95/62/1308 <7.0* 7.0 - 30.0 mg/dL Final    Performed at [facility]  . Opiates 03/09/2021 NONE DETECTED  NONE DETECTED Corrected  . Cocaine 03/09/2021 NONE DETECTED  NONE DETECTED Corrected  . Benzodiazepines 03/09/2021 NONE DETECTED  NONE DETECTED Corrected  . Amphetamines 03/09/2021 NONE DETECTED  NONE DETECTED Corrected  . Tetrahydrocannabinol 03/09/2021 NONE DETECTED  NONE DETECTED Corrected  . Barbiturates 03/09/2021 NONE DETECTED  NONE DETECTED Corrected   Comment: (NOTE) DRUG SCREEN FOR MEDICAL PURPOSES ONLY.  IF CONFIRMATION IS NEEDED FOR ANY PURPOSE, NOTIFY LAB WITHIN 5 DAYS.  LOWEST DETECTABLE LIMITS FOR URINE DRUG SCREEN Drug Class                     Cutoff (ng/mL) Amphetamine and metabolites    1000 Barbiturate and metabolites    200 Benzodiazepine                 657 Tricyclics and metabolites     300 Opiates and metabolites        300 Cocaine and metabolites        300 THC                            50 Performed at [facility] CORRECTED ON 04/19 AT 1311: PREVIOUSLY REPORTED AS NONE DETECTED   . Color, Urine 03/09/2021 YELLOW  YELLOW Final  . APPearance 03/09/2021 CLEAR  CLEAR Final  . Specific Gravity, Urine 03/09/2021 1.027  1.005 - 1.030 Final  . pH 03/09/2021 7.5  5.0 - 8.0 Final  . Glucose, UA 03/09/2021 >1,000* NEGATIVE mg/dL Final  . Hgb urine dipstick 03/09/2021 NEGATIVE  NEGATIVE Final  . Bilirubin Urine 03/09/2021 NEGATIVE  NEGATIVE Final  . Ketones, ur 03/09/2021 40* NEGATIVE mg/dL Final  . Protein, ur 03/09/2021 30* NEGATIVE mg/dL Final  . Nitrite 03/09/2021 NEGATIVE  NEGATIVE Final  . Chalmers Guest 03/09/2021 NEGATIVE  NEGATIVE Final  . RBC / HPF 03/09/2021 0-5  0 - 5 RBC/hpf Final  . WBC, UA 03/09/2021 0-5  0 - 5 WBC/hpf Final  . Bacteria, UA 03/09/2021 RARE* NONE SEEN Final  . Squamous Epithelial / LPF 03/09/2021 0-5  0 - 5 Final  . Mucus 03/09/2021 PRESENT   Final   Performed at [facility]  . Alcohol, Ethyl (B) 03/09/2021 <10  <10  mg/dL Final   Comment: (NOTE) Lowest detectable limit for serum alcohol is 10 mg/dL.  For medical purposes only. Performed at [facility]   . Troponin I (High Sensitivity) 03/09/2021 4  <  18 ng/L Final   Comment: (NOTE) Elevated high sensitivity troponin I (hsTnI) values and significant  changes across serial measurements may suggest ACS but many other  chronic and acute conditions are known to elevate hsTnI results.  Refer to the "Links" section for chest pain algorithms and additional  guidance. Performed at [facility]   . SARS Coronavirus 2 by RT PCR 03/09/2021 NEGATIVE  NEGATIVE Final   Comment: (NOTE) SARS-CoV-2 target nucleic acids are NOT DETECTED.  The SARS-CoV-2 RNA is generally detectable in upper respiratory specimens during the acute phase of infection. The lowest concentration of SARS-CoV-2 viral copies this assay can detect is 138 copies/mL. A negative result does not preclude SARS-Cov-2 infection and should not be used as the sole basis for treatment or other patient management decisions. A negative result may occur with  improper specimen collection/handling, submission of specimen other than nasopharyngeal swab, presence of viral mutation(s) within the areas targeted by this assay, and inadequate number of viral copies(<138 copies/mL). A negative result must be combined with clinical observations, patient history, and epidemiological information. The expected result is Negative.  Fact Sheet for Patients:  EntrepreneurPulse.com.au  Fact Sheet for Healthcare Providers:  IncredibleEmployment.be  This test is no                          t yet approved or cleared by the [place] FDA and  has been authorized for detection and/or diagnosis of SARS-CoV-2 by FDA under an Emergency Use Authorization (EUA). This EUA will remain  in effect (meaning this test can be used) for the duration of  the COVID-19 declaration under Section 564(b)(1) of the Act, 21 U.S.C.section 360bbb-3(b)(1), unless the authorization is terminated  or revoked sooner.      . Influenza A by PCR 03/09/2021 NEGATIVE  NEGATIVE Final  . Influenza B by PCR 03/09/2021 NEGATIVE  NEGATIVE Final   Comment: (NOTE) The Xpert Xpress SARS-CoV-2/FLU/RSV plus assay is intended as an aid in the diagnosis of influenza from Nasopharyngeal swab specimens and should not be used as a sole basis for treatment. Nasal washings and aspirates are unacceptable for Xpert Xpress SARS-CoV-2/FLU/RSV testing.  Fact Sheet for Patients: EntrepreneurPulse.com.au  Fact Sheet for Healthcare Providers: IncredibleEmployment.be  This test is not yet approved or cleared by the [place] FDA and has been authorized for detection and/or diagnosis of SARS-CoV-2 by FDA under an Emergency Use Authorization (EUA). This EUA will remain in effect (meaning this test can be used) for the duration of the COVID-19 declaration under Section 564(b)(1) of the Act, 21 U.S.C. section 360bbb-3(b)(1), unless the authorization is terminated or revoked.  Performed at [facility]   . Hgb A1c MFr Bld 03/09/2021 9.3* 4.8 - 5.6 % Final   Comment: (NOTE) Pre diabetes:          5.7%-6.4%  Diabetes:              >6.4%  Glycemic control for   <7.0% adults with diabetes   . Mean Plasma Glucose 03/09/2021 220.21  mg/dL Final   Performed at [facility] [address], [place], [place] [address]  . Glucose-Capillary 03/09/2021 298* 70 - 99 mg/dL Final   Glucose reference range applies only to samples taken after fasting for at least 8 hours.  . Glucose-Capillary 03/09/2021 103* 70 - 99 mg/dL Final   Glucose reference range applies only to samples taken after fasting for at least 8  hours.  Admission on 02/12/2021, Discharged on 02/12/2021  Component Date Value Ref Range Status  . Sodium  02/12/2021 134* 135 - 145 mmol/L Final  . Potassium 02/12/2021 4.0  3.5 - 5.1 mmol/L Final  . Chloride 02/12/2021 102  98 - 111 mmol/L Final  . CO2 02/12/2021 25  22 - 32 mmol/L Final  . Glucose, Bld 02/12/2021 361* 70 - 99 mg/dL Final   Glucose reference range applies only to samples taken after fasting for at least 8 hours.  . BUN 02/12/2021 8  6 - 20 mg/dL Final  . Creatinine, Ser 02/12/2021 0.59* 0.61 - 1.24 mg/dL Final  . Calcium 02/12/2021 9.3  8.9 - 10.3 mg/dL Final  . GFR, Estimated 02/12/2021 >60  >60 mL/min Final   Comment: (NOTE) Calculated using the CKD-EPI Creatinine Equation (2021)   . Anion gap 02/12/2021 7  5 - 15 Final   Performed at [facility] [address] [place], [place] [address]  . WBC 02/12/2021 9.1  4.0 - 10.5 K/uL Final  . RBC 02/12/2021 4.37  4.22 - 5.81 MIL/uL Final  . Hemoglobin 02/12/2021 14.0  13.0 - 17.0 g/dL Final  . HCT 02/12/2021 42.4  39.0 - 52.0 % Final  . MCV 02/12/2021 97.0  80.0 - 100.0 fL Final  . MCH 02/12/2021 32.0  26.0 - 34.0 pg Final  . MCHC 02/12/2021 33.0  30.0 - 36.0 g/dL Final  . RDW 02/12/2021 12.6  11.5 - 15.5 % Final  . Platelets 02/12/2021 226  150 - 400 K/uL Final  . nRBC 02/12/2021 0.0  0.0 - 0.2 % Final   Performed at [facility], [place] [address], [place], [place] [address]  . Color, Urine 02/12/2021 YELLOW  YELLOW Final  . APPearance 02/12/2021 CLEAR  CLEAR Final  . Specific Gravity, Urine 02/12/2021 1.038* 1.005 - 1.030 Final  . pH 02/12/2021 6.0  5.0 - 8.0 Final  . Glucose, UA 02/12/2021 >=500* NEGATIVE mg/dL Final  . Hgb urine dipstick 02/12/2021 NEGATIVE  NEGATIVE Final  . Bilirubin Urine 02/12/2021 NEGATIVE  NEGATIVE Final  . Ketones, ur 02/12/2021 20* NEGATIVE mg/dL Final  . Protein, ur 02/12/2021 NEGATIVE  NEGATIVE mg/dL Final  . Nitrite 02/12/2021 NEGATIVE  NEGATIVE Final  . Chalmers Guest 02/12/2021 NEGATIVE  NEGATIVE Final  . RBC / HPF 02/12/2021 0-5  0 - 5 RBC/hpf Final  . WBC, UA 02/12/2021 0-5   0 - 5 WBC/hpf Final  . Bacteria, UA 02/12/2021 NONE SEEN  NONE SEEN Final   Performed at [facility], [place] [address], [place],  [place] [address]  . Glucose-Capillary 02/12/2021 341* 70 - 99 mg/dL Final   Glucose reference range applies only to samples taken after fasting for at least 8 hours.  . Comment 1 02/12/2021 Notify RN   Final  . Comment 2 02/12/2021 Document in Chart   Final  Office Visit on 02/12/2021  Component Date Value Ref Range Status  . Color, UA 02/12/2021 yellow  yellow Final  . Clarity, UA 02/12/2021 clear  clear Final  . Glucose, UA 02/12/2021 =500* negative mg/dL Final  . Bilirubin, UA 02/12/2021 negative  negative Final  . Ketones, POC UA 02/12/2021 trace (5)* negative mg/dL Final  . Spec Grav, UA 02/12/2021 1.020  1.010 - 1.025 Final  . Blood, UA 02/12/2021 negative  negative Final  . pH, UA 02/12/2021 6.5  5.0 - 8.0 Final  . POC PROTEIN,UA 02/12/2021 negative  negative, trace Final  . Urobilinogen, UA 02/12/2021  0.2  0.2 or 1.0 E.U./dL Final  . Nitrite, UA 02/12/2021 Negative  Negative Final  . Leukocytes, UA 02/12/2021 Negative  Negative Final  . POC Glucose 02/12/2021 406* 70 - 99 mg/dl Final  Office Visit on 12/24/2020  Component Date Value Ref Range Status  . Hemoglobin A1C 12/24/2020 13.1* 4.0 - 5.6 % Final  . Creatinine, Urine 12/24/2020 67.2  Not Estab. mg/dL Final  . Microalbumin, Urine 12/24/2020 38.8  Not Estab. ug/mL Final  . Microalb/Creat Ratio 12/24/2020 58* 0 - 29 mg/g creat Final   Comment:                        Normal:                0 -  29                        Moderately increased: 30 - 300                        Severely increased:       >300   . WBC 12/24/2020 8.6  3.4 - 10.8 x10E3/uL Final  . RBC 12/24/2020 4.72  4.14 - 5.80 x10E6/uL Final  . Hemoglobin 12/24/2020 14.7  13.0 - 17.7 g/dL Final  . Hematocrit 12/24/2020 44.3  37.5 - 51.0 % Final  . MCV 12/24/2020 94  79 - 97 fL Final  . MCH 12/24/2020 31.1  26.6 - 33.0 pg Final   . MCHC 12/24/2020 33.2  31.5 - 35.7 g/dL Final  . RDW 12/24/2020 12.3  11.6 - 15.4 % Final  . Platelets 12/24/2020 201  150 - 450 x10E3/uL Final  . Neutrophils 12/24/2020 62  Not Estab. % Final  . Lymphs 12/24/2020 29  Not Estab. % Final  . Monocytes 12/24/2020 8  Not Estab. % Final  . Eos 12/24/2020 1  Not Estab. % Final  . Basos 12/24/2020 0  Not Estab. % Final  . Neutrophils Absolute 12/24/2020 5.4  1.4 - 7.0 x10E3/uL Final  . Lymphocytes Absolute 12/24/2020 2.5  0.7 - 3.1 x10E3/uL Final  . Monocytes Absolute 12/24/2020 0.7  0.1 - 0.9 x10E3/uL Final  . EOS (ABSOLUTE) 12/24/2020 0.1  0.0 - 0.4 x10E3/uL Final  . Basophils Absolute 12/24/2020 0.0  0.0 - 0.2 x10E3/uL Final  . Immature Granulocytes 12/24/2020 0  Not Estab. % Final  . Immature Grans (Abs) 12/24/2020 0.0  0.0 - 0.1 x10E3/uL Final  . Glucose 12/24/2020 284* 65 - 99 mg/dL Final  . BUN 12/24/2020 7  6 - 24 mg/dL Final  . Creatinine, Ser 12/24/2020 0.60* 0.76 - 1.27 mg/dL Final  . GFR calc non Af Amer 12/24/2020 112  >59 mL/min/1.73 Final  . GFR calc Af Amer 12/24/2020 130  >59 mL/min/1.73 Final   Comment: **In accordance with recommendations from the NKF-ASN Task force,**   [facility] is in the process of updating its eGFR calculation to the   2021 CKD-EPI creatinine equation that estimates kidney function   without a race variable.   . BUN/Creatinine Ratio 12/24/2020 12  9 - 20 Final  . Sodium 12/24/2020 140  134 - 144 mmol/L Final  . Potassium 12/24/2020 4.8  3.5 - 5.2 mmol/L Final  . Chloride 12/24/2020 102  96 - 106 mmol/L Final  . Calcium 12/24/2020 9.8  8.7 - 10.2 mg/dL Final  . Total Protein 12/24/2020 6.9  6.0 - 8.5 g/dL  Final  . Albumin 12/24/2020 4.3  3.8 - 4.9 g/dL Final  . Globulin, Total 12/24/2020 2.6  1.5 - 4.5 g/dL Final  . Albumin/Globulin Ratio 12/24/2020 1.7  1.2 - 2.2 Final  . Bilirubin Total 12/24/2020 <0.2  0.0 - 1.2 mg/dL Final  . Alkaline Phosphatase 12/24/2020 74  44 - 121 IU/L Final  . AST  12/24/2020 26  0 - 40 IU/L Final  . Cholesterol, Total 12/24/2020 156  100 - 199 mg/dL Final  . Triglycerides 12/24/2020 62  0 - 149 mg/dL Final  . HDL 12/24/2020 55  >39 mg/dL Final  . VLDL Cholesterol Cal 12/24/2020 12  5 - 40 mg/dL Final  . LDL Chol Calc (NIH) 12/24/2020 89  0 - 99 mg/dL Final  . Chol/HDL Ratio 12/24/2020 2.8  0.0 - 5.0 ratio Final   Comment:                                   T. Chol/HDL Ratio                                             Men  Women                               1/2 Avg.Risk  3.4    3.3                                   Avg.Risk  5.0    4.4                                2X Avg.Risk  9.6    7.1                                3X Avg.Risk 23.4   11.0   . TSH 12/24/2020 0.953  0.450 - 4.500 uIU/mL Final  . Prostate Specific Ag, Serum 12/24/2020 0.7  0.0 - 4.0 ng/mL Final   Comment: Roche ECLIA methodology. According to the American Urological Association, Serum PSA should decrease and remain at undetectable levels after radical prostatectomy. The AUA defines biochemical recurrence as an initial PSA value 0.2 ng/mL or greater followed by a subsequent confirmatory PSA value 0.2 ng/mL or greater. Values obtained with different assay methods or kits cannot be used interchangeably. Results cannot be interpreted as absolute evidence of the presence or absence of malignant disease.   . Vit D, 25-Hydroxy 12/24/2020 6.6* 30.0 - 100.0 ng/mL Final   Comment: Vitamin D deficiency has been defined by the [place] practice guideline as a level of serum 25-OH vitamin D less than 20 ng/mL (1,2). The Endocrine Society went on to further define vitamin D insufficiency as a level between 21 and 29 ng/mL (2). 1. IOM (Institute of Medicine). 2010. Dietary reference    intakes for calcium and D. [place]: The    [employer]. 2. Holick MF, Binkley [place], Bischoff-Ferrari HA, et al.    Evaluation, treatment, and  prevention of vitamin D  deficiency: an Endocrine Society clinical practice    guideline. JCEM. 2011 Jul; 96(7):1911-30.     Blood Alcohol level:  Lab Results  Component Value Date   ETH <10 03/09/2021   ETH 137 (H) 94/70/9628    Metabolic Disorder Labs: Lab Results  Component Value Date   HGBA1C 9.3 (H) 03/09/2021   MPG 220.21 03/09/2021   MPG 246.04 03/21/2020   No results found for: PROLACTIN Lab Results  Component Value Date   CHOL 156 12/24/2020   TRIG 62 12/24/2020   HDL 55 12/24/2020   CHOLHDL 2.8 12/24/2020   LDLCALC 89 12/24/2020   [place] 95 05/17/2017    Therapeutic Lab Levels: No results found for: LITHIUM No results found for: VALPROATE No components found for:  CBMZ  Physical Findings   AIMS   Flowsheet Row Admission (Discharged) from 12/23/2016 in [facility] Total Score 0    AUDIT   [facility] Admission (Discharged) from 12/23/2016 in [facility]  Alcohol Use Disorder Identification Test Final Score (AUDIT) 29    GAD-7   Flowsheet Row ED from 03/09/2021 in [facility] from 12/24/2020 in [facility] at [facility] Visit from 05/17/2017 in [facility] Office Visit from 02/28/2017 in [facility] Office Visit from 01/02/2017 in [facility]  Total GAD-7 Score '15 15 20 17 17    ' PHQ2-9   [place] Office Visit from 01/20/2021 in [facility] at [place] from 12/24/2020 in [place] [facility] [place] Visit from 05/17/2017 in [place] Office Visit from 02/28/2017 in [place] Office Visit from 01/02/2017 in [place]  PHQ-2 Total Score '3 4 6 4 6  ' PHQ-9 Total Score '11 20 24 19 25    ' Flowsheet Row ED from 03/09/2021 in  [facility] ED from 02/12/2021 in [place] from 12/24/2020 in Primary [facility] at [place] High Risk Error: Question 6 not populated Low Risk       Musculoskeletal  Strength & Muscle Tone: within normal limits Gait & Station: normal Patient leans: N/A  Psychiatric Specialty Exam  Presentation  General Appearance: Appropriate for Environment  Eye Contact:Fair  Speech:Normal Rate  Speech Volume:Decreased  Handedness:Right   Mood and Affect  Mood:Anxious; Depressed; Hopeless; Worthless  Affect:Tearful   Thought Production assistant, radio Processes:Coherent  Descriptions of Associations:Intact  Orientation:Full (Time, Place and Person)  Thought Content:Logical  Diagnosis of Schizophrenia or Schizoaffective disorder in past: No    Hallucinations:Hallucinations: None Description of Auditory Hallucinations: states that he is hearing music play inside his head  Ideas of Reference:None  Suicidal Thoughts:Suicidal Thoughts: Yes, Active SI Active Intent and/or Plan: With Intent  Homicidal Thoughts:Homicidal Thoughts: No   Sensorium  Memory:Immediate Good; Recent Good; Remote Good  Judgment:Good  Insight:Good   Executive Functions  Concentration:Good  Attention Span:Good  [place] of Knowledge:Good  Language:Good   Psychomotor Activity  Psychomotor Activity:Psychomotor Activity: Normal   Assets  Assets:Communication Skills; Desire for Improvement; Housing; Resilience; Social Support; Vocational/Educational   Sleep  Sleep:Sleep: Fair   Nutritional Assessment (For OBS and FBC admissions only) Has the patient had a weight loss or gain of 10 pounds or more in the last 3 months?: No Has the  patient had a decrease in food intake/or appetite?: No Does the patient have dental problems?: No Does the patient have eating habits or  behaviors that may be indicators of an eating disorder including binging or inducing vomiting?: No Has the patient recently lost weight without trying?: No Has the patient been eating poorly because of a decreased appetite?: Yes Malnutrition Screening Tool Score: 1    Physical Exam  Physical Exam Vitals and nursing note reviewed.  Constitutional:      Appearance: He is well-developed.  HENT:     Head: Normocephalic and atraumatic.  Eyes:     Conjunctiva/sclera: Conjunctivae normal.  Cardiovascular:     Rate and Rhythm: Normal rate and regular rhythm.     Heart sounds: No murmur heard.   Pulmonary:     Effort: Pulmonary effort is normal. No respiratory distress.     Breath sounds: Normal breath sounds.  Abdominal:     Palpations: Abdomen is soft.     Tenderness: There is no abdominal tenderness.  Musculoskeletal:        General: Normal range of motion.     Cervical back: Neck supple.  Skin:    General: Skin is warm and dry.  Neurological:     Mental Status: He is alert and oriented to person, place, and time.    Review of Systems  Constitutional: Negative.   HENT: Negative.   Respiratory: Negative.   Cardiovascular: Negative.   Gastrointestinal: Negative.   Musculoskeletal: Negative.   Neurological: Negative.   Psychiatric/Behavioral: Positive for depression and suicidal ideas. The patient is nervous/anxious.    Blood pressure 127/87, pulse 74, temperature 98 F (36.7 C), resp. rate 16, SpO2 100 %. There is no height or weight on file to calculate BMI.  Assessment: 55 year old male with past psychiatric history of major depressive disorder, alcohol use disorder who presented to [facility] voluntarily due to worsening anxiety and suicidal ideations.  He was transferred to behavioral health urgent care for continuous assessment and further evaluation. #Major depressive disorder --On evaluation patient admits to suicidal ideation with a plan " whatever I can  get my hands on", endorses depressed mood, poor concentration and attention, hopelessness, worthlessness, low self-esteem, decreased energy .  He is unable to contract for safety, feeling helpless and asking for help.  He is alert, awake, oriented to time place and person and does not seem like responding to internal stimuli but looks extremely dysphoric, was tearful during whole conversation --Patient started on Lexapro 10 mg, gabapentin secure chat sent for patient case evaluation for inpatient psych bed. --Appreciate the social workers assistance   Treatment Plan Summary: -- Patient is suicidal and meets criteria for inpatient psychiatric hospitalization.  He would benefit from continued medication management and observation until able to find inpatient psych bed for him --Continue Lexapro 10 mg daily for depression and anxiety -- Daily contact with patient to assess and evaluate symptoms and progress in treatment  Honor Junes, MD 03/10/2021 10:58 AM  PGY-1, Resident

## 2021-03-10 NOTE — Discharge Instructions (Addendum)
Discharge to [facility]

## 2021-03-10 NOTE — ED Notes (Signed)
Patient is sitting in chair beside bed eating and watching television.

## 2021-03-10 NOTE — Progress Notes (Signed)
Pt under review with [place]. CSW awaiting decision from [facility].  Assunta Curtis, MSW, LCSW 03/10/2021 12:04 PM

## 2021-03-10 NOTE — Progress Notes (Signed)
Patient was accepted to [facility].  Meets inpatient criteria per Dr. Demaris Callander.    Attending physician is Dr. Mallie Darting.  Notified Ryta Kathlen Brunswick, RN and Marinda Elk, RN of acceptance.  Nurses call report to [phone].   Patient can arrive 03/10/2021 after 8:00 PM.    Assunta Curtis, MSW, LCSW 03/10/2021 3:37 PM

## 2021-03-10 NOTE — ED Notes (Signed)
Patient denies pain and is resting comfortably.  

## 2021-03-10 NOTE — ED Notes (Signed)
Pt asleep in bed. Respirations even and unlabored. Will continue to monitor for safety. ?

## 2021-03-10 NOTE — ED Notes (Signed)
Pt admitted to continuous assessment due to SI with plan to "jump into traffic." Pt A&O x4, calm and cooperative. Pt tolerated skin assessment well. Pt ambulated independently to unit. Oriented to unit/staff. No signs of acute distress noted. Will continue to monitor for safety.

## 2021-03-10 NOTE — ED Notes (Signed)
Called [facility] and provided hand off report to, RN Rudi

## 2021-03-11 ENCOUNTER — Encounter (HOSPITAL_COMMUNITY): Payer: Self-pay | Admitting: Student in an Organized Health Care Education/Training Program

## 2021-03-11 ENCOUNTER — Other Ambulatory Visit: Payer: Self-pay

## 2021-03-11 DIAGNOSIS — F322 Major depressive disorder, single episode, severe without psychotic features: Principal | ICD-10-CM

## 2021-03-11 LAB — TSH: TSH: 1.203 u[IU]/mL (ref 0.350–4.500)

## 2021-03-11 LAB — GLUCOSE, CAPILLARY
Glucose-Capillary: 232 mg/dL — ABNORMAL HIGH (ref 70–99)
Glucose-Capillary: 189 mg/dL — ABNORMAL HIGH (ref 70–99)
Glucose-Capillary: 285 mg/dL — ABNORMAL HIGH (ref 70–99)
Glucose-Capillary: 306 mg/dL — ABNORMAL HIGH (ref 70–99)

## 2021-03-11 LAB — LIPID PANEL
Cholesterol: 139 mg/dL (ref 0–200)
HDL: 58 mg/dL (ref >40)
LDL Cholesterol: 58 mg/dL (ref 0–99)
Total CHOL/HDL Ratio: 2.4 RATIO
Triglycerides: 117 mg/dL (ref <150)
VLDL: 23 mg/dL (ref 0–40)

## 2021-03-11 MED ORDER — INSULIN GLARGINE 100 UNIT/ML ~~LOC~~ SOLN
28.0000 [IU] | Freq: Every day | SUBCUTANEOUS | Status: DC
  Administered 2021-03-11 – 2021-03-14 (×4): 28 [IU] via SUBCUTANEOUS
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
## 2021-03-11 NOTE — Progress Notes (Signed)
Inpatient Diabetes Program Recommendations  AACE/ADA: New Consensus Statement on Inpatient Glycemic Control (2015)  Target Ranges:  Prepandial:   less than 140 mg/dL      Peak postprandial:   less than 180 mg/dL (1-2 hours)      Critically ill patients:  140 - 180 mg/dL   Lab Results  Component Value Date   GLUCAP 189 (H) 03/11/2021   HGBA1C 9.3 (H) 03/09/2021    Review of Glycemic Control Results for Samuel [name], Samuel [name] (MRN [id number]) as of 03/11/2021 15:00  Ref. Range 03/10/2021 16:59 03/11/2021 05:56 03/11/2021 11:24  Glucose-Capillary Latest Ref Range: 70 - 99 mg/dL 261 (H) 232 (H) 189 (H)   Diabetes history: Dm 2 Outpatient Diabetes medications:  Lantus 28 units daily, Metformin 1000 mg bid Current orders for Inpatient glycemic control:  Novolog 0-6 units tid with meals, Metformin 1000 mg bid  Inpatient Diabetes Program Recommendations:    Please add patients home dose of Lantus 28 units daily.  Thanks Adah Perl, RN, BC-ADM Inpatient Diabetes Coordinator Pager [phone] (8a-5p)

## 2021-03-11 NOTE — Progress Notes (Signed)
Admission Note:  Samuel [name] 03/10/2021 MRN: [id number] Patient is 55 year old male who presents voluntarily to [facility] from [facility] for worsening anxiety, depression, and alcohol abuse. Patient reports earlier in the week he had an anxiety attack at work and was unable to calm himself down. Patient stated that when the anxiety was not relieved by drinking he contemplated walking in front of a bus. Patient lives with sister and identifies her as his main support system. Patient states he would like help with coping mechanisms for his depression and relaxation techniques for his anxiety. Patient presents with depressed and anxious affect at time of assessment. Patient denies SI/HI at this time. Patient reports AH of music but denies VH. Patient contracts for safety.

## 2021-03-11 NOTE — Plan of Care (Signed)
  Problem: Education: Goal: Knowledge of [facility] Education information/materials will improve Outcome: Progressing Goal: Emotional status will improve Outcome: Progressing Goal: Mental status will improve Outcome: Progressing

## 2021-03-11 NOTE — Tx Team (Signed)
Initial Treatment Plan 03/11/2021 2:30 AM Seger Jani Girdler II [id number]    PATIENT STRESSORS: Substance abuse Other: anxiety attacks   PATIENT STRENGTHS: Ability for insight Active sense of humor Communication skills Supportive family/friends   PATIENT IDENTIFIED PROBLEMS: Suicidal ideation  Anxiety   (relaxation techniques and coping mechanisms)                 DISCHARGE CRITERIA:  Improved stabilization in mood, thinking, and/or behavior Reduction of life-threatening or endangering symptoms to within safe limits Verbal commitment to aftercare and medication compliance  PRELIMINARY DISCHARGE PLAN: Outpatient therapy Return to previous living arrangement Return to previous work or school arrangements  PATIENT/FAMILY INVOLVEMENT: This treatment plan has been presented to and reviewed with the patient, Keyon Liller Leh II, and/or family member.  The patient and family have been given the opportunity to ask questions and make suggestions.  Arvid Right, RN 03/11/2021, 2:30 AM
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
## 2021-03-11 NOTE — Progress Notes (Signed)
Progress note    03/11/21 0823  Psych Admission Type (Psych Patients Only)  Admission Status Voluntary  Psychosocial Assessment  Patient Complaints Anxiety;Depression  Eye Contact Fair  Facial Expression Anxious;Sullen;Sad;Worried  Affect Anxious;Depressed;Sad;Sullen  Catering manager Activity Slow  Appearance/Hygiene Unremarkable  Behavior Characteristics Cooperative;Appropriate to situation;Anxious  Mood Depressed;Anxious;Sad;Sullen;Pleasant  Thought Pension scheme manager thinking  Content WDL  Delusions None reported or observed  Perception Hallucinations  Hallucination Auditory  Judgment Poor  Confusion None  Danger to Self  Current suicidal ideation? Denies  Self-Injurious Behavior No self-injurious ideation or behavior indicators observed or expressed   Danger to Others  Danger to Others None reported or observed

## 2021-03-12 LAB — GLUCOSE, CAPILLARY
Glucose-Capillary: 207 mg/dL — ABNORMAL HIGH (ref 70–99)
Glucose-Capillary: 153 mg/dL — ABNORMAL HIGH (ref 70–99)
Glucose-Capillary: 306 mg/dL — ABNORMAL HIGH (ref 70–99)
Glucose-Capillary: 232 mg/dL — ABNORMAL HIGH (ref 70–99)

## 2021-03-12 MED ORDER — ESCITALOPRAM OXALATE 20 MG PO TABS
20.0000 mg | ORAL_TABLET | Freq: Every day | ORAL | Status: DC
  Administered 2021-03-13 – 2021-03-15 (×3): 20 mg via ORAL
  Filled 2021-03-12 (×2): qty 2
  Filled 2021-03-12 (×5): qty 1

## 2021-03-12 NOTE — BHH Counselor (Signed)
Pt requested scheduled for SAIOP. CSW informed him that at [facility] it is MWF from 9am-12pm. Pt states he is going to call his job and see if they are willing to work with him on these times before he makes a final decision between inpatient or outpatient.   Toney Reil, [place] Worker [employer]

## 2021-03-12 NOTE — Progress Notes (Signed)
Progress note    03/12/21 0742  Psych Admission Type (Psych Patients Only)  Admission Status Voluntary  Psychosocial Assessment  Patient Complaints Anxiety  Eye Contact Fair  Facial Expression Anxious;Pensive  Affect Anxious  Speech Logical/coherent  Interaction Assertive  Motor Activity Slow  Appearance/Hygiene Unremarkable  Behavior Characteristics Cooperative;Appropriate to situation;Anxious  Mood Anxious;Pleasant  Thought Process  Coherency Concrete thinking  Content WDL  Delusions None reported or observed  Perception WDL  Hallucination None reported or observed  Judgment Poor  Confusion None  Danger to Self  Current suicidal ideation? Denies  Danger to Others  Danger to Others None reported or observed

## 2021-03-12 NOTE — Progress Notes (Signed)
Recreation Therapy Notes  Date:  4.22.22 Time: 0930 Location: [address] Dayroom  Group Topic: Stress Management  Goal Area(s) Addresses:  Patient will identify positive stress management techniques. Patient will identify benefits of using stress management post d/c.  Intervention: Stress Management  Activity :  Progressive Muscle Relaxation.  LRT read a script that guided patients in tensing each muscle group individually and then relaxing it to release any tension they may have in the body.  Education:  Stress Management, Discharge Planning.   Education Outcome: Acknowledges Education  Clinical Observations/Feedback: Pt did not attend group session.    Victorino Sparrow, LRT/CTRS         Ria Comment, Pate Aylward A 03/12/2021 11:00 AM

## 2021-03-12 NOTE — Progress Notes (Signed)
[facility] MD Progress Note  03/12/2021 10:56 AM SHA AMER II  MRN:  [id number]   Subjective: Brantly reported " bad panic attack."  Evaluation: Ameet observed resting in bed.  He reports "I had a really bad panic/anxiety attack the worst of ever had before."   He reports he was prescribed medication for his depression and anxiety however has been noncompliant with medications. " I thought I could use drink and handle it on my own."  He denied suicidal or homicidal ideations.  Denies auditory or visual hallucinations.  Norris reports he is hopeful to follow-up with chemical dependency outpatient programming at discharge.  Rates his depression 8 out of 10 with 10 being the worst.   Denies alcohol cravings and/or withdrawal symptoms currently.  Discussed increasing Lexapro 10 mg to 20 mg.  Patient was receptive to plan.    Patient reports he is hopeful that he will have a job at discharge as he was working through a [employer], [employer].  Reports a fair appetite.  States he is present okay throughout the night. Staff to continue to monitor for safety.  Support,  Encouragement and reassurance was provided.  Principal Problem: Major depressive disorder, severe (HCC) Diagnosis: Principal Problem:   Major depressive disorder, severe ([place])  Total Time spent with patient: 15 minutes  Past Psychiatric History:   Past Medical History:  Past Medical History:  Diagnosis Date  . Diabetes mellitus without complication ([place])   . Pancreatitis unk   History reviewed. No pertinent surgical history. Family History: History reviewed. No pertinent family history. Family Psychiatric  History:  Social History:  Social History   Substance and Sexual Activity  Alcohol Use Yes  . Alcohol/week: 50.0 standard drinks  . Types: 50 Cans of beer per week   Comment: last thursday - a couple of beers     Social History   Substance and Sexual Activity  Drug Use No    Social History   Socioeconomic History  .  Marital status: Single    Spouse name: Not on file  . Number of children: Not on file  . Years of education: Not on file  . Highest education level: Not on file  Occupational History  . Not on file  Tobacco Use  . Smoking status: Current Every Day Smoker    Packs/day: 1.00    Types: Cigarettes  . Smokeless tobacco: Never Used  Substance and Sexual Activity  . Alcohol use: Yes    Alcohol/week: 50.0 standard drinks    Types: 50 Cans of beer per week    Comment: last thursday - a couple of beers  . Drug use: No  . Sexual activity: Yes  Other Topics Concern  . Not on file  Social History Narrative  . Not on file   Social Determinants of Health   Financial Resource Strain: Not on file  Food Insecurity: Not on file  Transportation Needs: Not on file  Physical Activity: Not on file  Stress: Not on file  Social Connections: Not on file   Additional Social History:                         Sleep: Fair  Appetite:  Fair  Current Medications: Current Facility-Administered Medications  Medication Dose Route Frequency Provider Last Rate Last Admin  . acetaminophen (TYLENOL) tablet 650 mg  650 mg Oral Q6H PRN Money, Lowry Ram, FNP   650 mg at 03/11/21 9381  . alum & mag  hydroxide-simeth (MAALOX/MYLANTA) 200-200-20 MG/5ML suspension 30 mL  30 mL Oral Q4H PRN Money, Lowry Ram, FNP      . aspirin EC tablet 81 mg  81 mg Oral Daily Money, Lowry Ram, FNP   81 mg at 03/12/21 1610  . atorvastatin (LIPITOR) tablet 40 mg  40 mg Oral Daily Money, Lowry Ram, FNP   40 mg at 03/12/21 9604  . escitalopram (LEXAPRO) tablet 10 mg  10 mg Oral Daily Money, Lowry Ram, FNP   10 mg at 03/12/21 5409  . gabapentin (NEURONTIN) capsule 300 mg  300 mg Oral TID Money, Lowry Ram, FNP   300 mg at 03/12/21 8119  . hydrOXYzine (ATARAX/VISTARIL) tablet 25 mg  25 mg Oral Q6H PRN Money, Lowry Ram, FNP   25 mg at 03/11/21 1147  . insulin aspart (novoLOG) injection 0-6 Units  0-6 Units Subcutaneous TID WC Money,  Lowry Ram, FNP   2 Units at 03/12/21 [phone]  . insulin glargine (LANTUS) injection 28 Units  28 Units Subcutaneous QHS [name], NP   28 Units at 03/11/21 2139  . loperamide (IMODIUM) capsule 2-4 mg  2-4 mg Oral PRN Money, Lowry Ram, FNP      . LORazepam (ATIVAN) tablet 1 mg  1 mg Oral Q6H PRN Money, Lowry Ram, FNP      . magnesium hydroxide (MILK OF MAGNESIA) suspension 30 mL  30 mL Oral Daily PRN Money, [name], FNP      . metFORMIN (GLUCOPHAGE) tablet 1,000 mg  1,000 mg Oral BID WC Money, [name], FNP   1,000 mg at 03/12/21 0742  . multivitamin with minerals tablet 1 tablet  1 tablet Oral Daily Money, Lowry Ram, FNP   1 tablet at 03/12/21 [phone]  . nicotine (NICODERM CQ - dosed in mg/24 hours) patch 21 mg  21 mg Transdermal Daily Money, Lowry Ram, FNP   21 mg at 03/12/21 0742  . ondansetron (ZOFRAN-ODT) disintegrating tablet 4 mg  4 mg Oral Q6H PRN Money, Lowry Ram, FNP      . pantoprazole (PROTONIX) EC tablet 40 mg  40 mg Oral Daily Money, Lowry Ram, FNP   40 mg at 03/12/21 2130  . thiamine (B-1) injection 100 mg  100 mg Intramuscular Once Money, [name], FNP      . thiamine tablet 100 mg  100 mg Oral Daily Money, Lowry Ram, FNP   100 mg at 03/12/21 8657  . traZODone (DESYREL) tablet 50 mg  50 mg Oral QHS PRN Money, Lowry Ram, FNP   50 mg at 03/11/21 2108    Lab Results:  Results for orders placed or performed during the hospital encounter of 03/10/21 (from the past 48 hour(s))  Glucose, capillary     Status: Abnormal   Collection Time: 03/11/21  5:56 AM  Result Value Ref Range   Glucose-Capillary 232 (H) 70 - 99 mg/dL    Comment: Glucose reference range applies only to samples taken after fasting for at least 8 hours.   Comment 1 Notify RN    Comment 2 Document in Chart   Glucose, capillary     Status: Abnormal   Collection Time: 03/11/21 11:24 AM  Result Value Ref Range   Glucose-Capillary 189 (H) 70 - 99 mg/dL    Comment: Glucose reference range applies only to samples taken after  fasting for at least 8 hours.  Glucose, capillary     Status: Abnormal   Collection Time: 03/11/21  5:05 PM  Result Value Ref  Range   Glucose-Capillary 285 (H) 70 - 99 mg/dL    Comment: Glucose reference range applies only to samples taken after fasting for at least 8 hours.  Lipid panel     Status: None   Collection Time: 03/11/21  6:43 PM  Result Value Ref Range   Cholesterol 139 0 - 200 mg/dL   Triglycerides 117 <150 mg/dL   HDL 58 >40 mg/dL   Total CHOL/HDL Ratio 2.4 RATIO   VLDL 23 0 - 40 mg/dL   LDL Cholesterol 58 0 - 99 mg/dL    Comment:        Total Cholesterol/HDL:CHD Risk Coronary Heart Disease Risk Table                     Men   Women  1/2 Average Risk   3.4   3.3  Average Risk       5.0   4.4  2 X Average Risk   9.6   7.1  3 X Average Risk  23.4   11.0        Use the calculated Patient Ratio above and the CHD Risk Table to determine the patient's CHD Risk.        ATP III CLASSIFICATION (LDL):  <100     mg/dL   Optimal  100-129  mg/dL   Near or Above                    Optimal  130-159  mg/dL   Borderline  160-189  mg/dL   High  >190     mg/dL   Very High Performed at [facility] [address], [place], [place] [address]   TSH     Status: None   Collection Time: 03/11/21  6:43 PM  Result Value Ref Range   TSH 1.203 0.350 - 4.500 uIU/mL    Comment: Performed by a 3rd Generation assay with a functional sensitivity of <=0.01 uIU/mL. Performed at [facility], [place] [address] [place], [place] [address]   Glucose, capillary     Status: Abnormal   Collection Time: 03/11/21  9:30 PM  Result Value Ref Range   Glucose-Capillary 306 (H) 70 - 99 mg/dL    Comment: Glucose reference range applies only to samples taken after fasting for at least 8 hours.  Glucose, capillary     Status: Abnormal   Collection Time: 03/12/21  6:19 AM  Result Value Ref Range   Glucose-Capillary 207 (H) 70 - 99 mg/dL    Comment: Glucose reference  range applies only to samples taken after fasting for at least 8 hours.    Blood Alcohol level:  Lab Results  Component Value Date   ETH <10 03/09/2021   ETH 137 (H) 51/12/5850    Metabolic Disorder Labs: Lab Results  Component Value Date   HGBA1C 9.3 (H) 03/09/2021   MPG 220.21 03/09/2021   MPG 246.04 03/21/2020   No results found for: PROLACTIN Lab Results  Component Value Date   CHOL 139 03/11/2021   TRIG 117 03/11/2021   HDL 58 03/11/2021   CHOLHDL 2.4 03/11/2021   VLDL 23 03/11/2021   LDLCALC 58 03/11/2021   LDLCALC 89 12/24/2020    Physical Findings: AIMS: Facial and Oral Movements Muscles of Facial Expression: None, normal Lips and Perioral Area: None, normal Jaw: None, normal Tongue: None, normal,Extremity Movements Upper (arms, wrists, hands, fingers): None, normal Lower (legs, knees, ankles, toes): None, normal, Trunk  Movements Neck, shoulders, hips: None, normal, Overall Severity Severity of abnormal movements (highest score from questions above): None, normal Incapacitation due to abnormal movements: None, normal Patient's awareness of abnormal movements (rate only patient's report): No Awareness, Dental Status Current problems with teeth and/or dentures?: Yes Does patient usually wear dentures?: Yes  CIWA:  CIWA-Ar Total: 0 COWS:     Musculoskeletal: Strength & Muscle Tone: within normal limits Gait & Station: normal Patient leans: N/A  Psychiatric Specialty Exam:  Presentation  General Appearance: Appropriate for Environment  Eye Contact:Fair  Speech:Normal Rate  Speech Volume:Decreased  Handedness:Right   Mood and Affect  Mood:Anxious; Depressed; Hopeless; Worthless  Affect:Tearful   Thought Production assistant, radio Processes:Coherent  Descriptions of Associations:Intact  Orientation:Full (Time, Place and Person)  Thought Content:Logical  History of Schizophrenia/Schizoaffective disorder:No  Duration of Psychotic Symptoms:No  data recorded Hallucinations:No data recorded Ideas of Reference:None  Suicidal Thoughts:No data recorded Homicidal Thoughts:No data recorded  Sensorium  Memory:Immediate Good; Recent Good; Remote Good  Judgment:Good  Insight:Good   Executive Functions  Concentration:Good  Attention Span:Good  [place] of Knowledge:Good  Language:Good   Psychomotor Activity  Psychomotor Activity:No data recorded  Assets  Assets:Communication Skills; Desire for Improvement; Housing; Resilience; Social Support; Vocational/Educational   Sleep  Sleep:No data recorded   Physical Exam: Physical Exam Vitals reviewed.  Neurological:     Mental Status: He is alert.  Psychiatric:        Mood and Affect: Mood normal.        Thought Content: Thought content normal.    Review of Systems  Psychiatric/Behavioral: Positive for depression. Negative for suicidal ideas. The patient is nervous/anxious.   All other systems reviewed and are negative.  Blood pressure 96/67, pulse 84, temperature 98.9 F (37.2 C), temperature source Oral, resp. rate 18, height 5\' 8"  (1.727 m), weight 67.1 kg, SpO2 100 %. Body mass index is 22.5 kg/m.   Treatment Plan Summary: Daily contact with patient to assess and evaluate symptoms and progress in treatment and Medication management   Continue with current treatment plan on 03/12/2021 as listed below except were noted  Major depressive disorder Generalized anxiety  Increase Lexapro 10 mg to 20 mg p.o. daily Continue gabapentin 300 mg p.o. 3 times daily Continue trazodone 50 mg p.o. nightly  Ativan detox/CIWA protocol as needed -Consider CD IOP at [facility] urgent care facility  CSW to continue working on discharge disposition Patient encouraged to participate in the milieu    Derrill Center, NP 03/12/2021, 10:56 AM

## 2021-03-12 NOTE — Tx Team (Signed)
Interdisciplinary Treatment and Diagnostic Plan Update  03/12/2021 Time of Session:  Samuel [name] MRN: [id number]  Principal Diagnosis: Major depressive disorder, severe (Samuel [name])  Secondary Diagnoses: Principal Problem:   Major depressive disorder, severe (Samuel [name])   Current Medications:  Current Facility-Administered Medications  Medication Dose Route Frequency Provider Last Rate Last Admin  . acetaminophen (TYLENOL) tablet 650 mg  650 mg Oral Q6H PRN Money, Lowry Ram, FNP   650 mg at 03/11/21 3086  . alum & mag hydroxide-simeth (MAALOX/MYLANTA) 200-200-20 MG/5ML suspension 30 mL  30 mL Oral Q4H PRN Money, Lowry Ram, FNP      . aspirin EC tablet 81 mg  81 mg Oral Daily Money, Lowry Ram, FNP   81 mg at 03/12/21 5784  . atorvastatin (LIPITOR) tablet 40 mg  40 mg Oral Daily Money, Lowry Ram, FNP   40 mg at 03/12/21 6962  . [START ON 03/13/2021] escitalopram (LEXAPRO) tablet 20 mg  20 mg Oral Daily Derrill Center, NP      . gabapentin (NEURONTIN) capsule 300 mg  300 mg Oral TID Money, [name], FNP   300 mg at 03/12/21 1116  . hydrOXYzine (ATARAX/VISTARIL) tablet 25 mg  25 mg Oral Q6H PRN Money, Lowry Ram, FNP   25 mg at 03/11/21 1147  . insulin aspart (novoLOG) injection 0-6 Units  0-6 Units Subcutaneous TID WC Money, Lowry Ram, FNP   1 Units at 03/12/21 1205  . insulin glargine (LANTUS) injection 28 Units  28 Units Subcutaneous QHS [name], NP   28 Units at 03/11/21 2139  . loperamide (IMODIUM) capsule 2-4 mg  2-4 mg Oral PRN Money, Lowry Ram, FNP      . LORazepam (ATIVAN) tablet 1 mg  1 mg Oral Q6H PRN Money, Lowry Ram, FNP      . magnesium hydroxide (MILK OF MAGNESIA) suspension 30 mL  30 mL Oral Daily PRN Money, [name], FNP      . metFORMIN (GLUCOPHAGE) tablet 1,000 mg  1,000 mg Oral BID WC Money, [name], FNP   1,000 mg at 03/12/21 0742  . multivitamin with minerals tablet 1 tablet  1 tablet Oral Daily Money, Lowry Ram, FNP   1 tablet at 03/12/21 [phone]  . nicotine (NICODERM CQ - dosed  in mg/24 hours) patch 21 mg  21 mg Transdermal Daily Money, Lowry Ram, FNP   21 mg at 03/12/21 0742  . ondansetron (ZOFRAN-ODT) disintegrating tablet 4 mg  4 mg Oral Q6H PRN Money, Lowry Ram, FNP      . pantoprazole (PROTONIX) EC tablet 40 mg  40 mg Oral Daily Money, Lowry Ram, FNP   40 mg at 03/12/21 4132  . thiamine (B-1) injection 100 mg  100 mg Intramuscular Once Money, [name], FNP      . thiamine tablet 100 mg  100 mg Oral Daily Money, Lowry Ram, FNP   100 mg at 03/12/21 4401  . traZODone (DESYREL) tablet 50 mg  50 mg Oral QHS PRN Money, Lowry Ram, FNP   50 mg at 03/11/21 2108   PTA Medications: Medications Prior to Admission  Medication Sig Dispense Refill Last Dose  . aspirin EC 81 MG tablet Take 1 tablet (81 mg total) by mouth daily. 30 tablet 2   . atorvastatin (LIPITOR) 40 MG tablet TAKE 1 TABLET (40 MG TOTAL) BY MOUTH AT BEDTIME. 30 tablet 2   . escitalopram (LEXAPRO) 10 MG tablet Take 1 tablet (10 mg total) by mouth daily.     [name]  gabapentin (NEURONTIN) 100 MG capsule Take 1 capsule (100 mg total) by mouth at bedtime. (Patient taking differently: Take 200 mg by mouth at bedtime.) 30 capsule 2   . gabapentin (NEURONTIN) 100 MG capsule TAKE 1 CAPSULE (100 MG TOTAL) BY MOUTH AT BEDTIME. 30 capsule 2   . hydrOXYzine (ATARAX/VISTARIL) 25 MG tablet TAKE 1 TABLET (25 MG TOTAL) BY MOUTH EVERY 6 (SIX) HOURS AS NEEDED. 60 tablet 2   . insulin glargine (LANTUS) 100 UNIT/ML Solostar Pen INJECT 28 UNITS INTO THE SKIN DAILY. 15 mL 1   . Insulin Pen Needle 32G X 4 MM MISC 1 EACH BY DOES NOT APPLY ROUTE IN THE MORNING AND AT BEDTIME. 100 each 11   . metFORMIN (GLUCOPHAGE) 1000 MG tablet Take 1 tablet (1,000 mg total) by mouth 2 (two) times daily. 60 tablet 2   . Multiple Vitamin (MULTIVITAMIN WITH MINERALS) TABS tablet Take 1 tablet by mouth daily.     . pantoprazole (PROTONIX) 40 MG tablet TAKE 1 TABLET (40 MG TOTAL) BY MOUTH DAILY. 30 tablet 2   . sertraline (ZOLOFT) 50 MG tablet TAKE 1 TABLET (50 MG  TOTAL) BY MOUTH DAILY. 30 tablet 2   . traZODone (DESYREL) 50 MG tablet TAKE 1 TABLET (50 MG TOTAL) BY MOUTH AT BEDTIME. 30 tablet 2   . TRUEplus Lancets 28G MISC USE AS INSTRUCTED 100 each 12     Patient Stressors: Substance abuse Other: anxiety attacks  Patient Strengths: Ability for insight Active sense of humor Communication skills Supportive family/friends  Treatment Modalities: Medication Management, Group therapy, Case management,  1 to 1 session with clinician, Psychoeducation, Recreational therapy.   Physician Treatment Plan for Primary Diagnosis: Major depressive disorder, severe (Samuel [name]) Long Term Goal(s): Improvement in symptoms so as ready for discharge Improvement in symptoms so as ready for discharge   Short Term Goals: Ability to identify changes in lifestyle to reduce recurrence of condition will improve Ability to verbalize feelings will improve Ability to disclose and discuss suicidal ideas Ability to demonstrate self-control will improve Ability to identify and develop effective coping behaviors will improve Ability to maintain clinical measurements within normal limits will improve Compliance with prescribed medications will improve Ability to identify triggers associated with substance abuse/mental health issues will improve Ability to identify changes in lifestyle to reduce recurrence of condition will improve Ability to verbalize feelings will improve Ability to disclose and discuss suicidal ideas Ability to demonstrate self-control will improve Ability to identify and develop effective coping behaviors will improve Ability to maintain clinical measurements within normal limits will improve Compliance with prescribed medications will improve Ability to identify triggers associated with substance abuse/mental health issues will improve  Medication Management: Evaluate patient's response, side effects, and tolerance of medication regimen.  Therapeutic  Interventions: 1 to 1 sessions, Unit Group sessions and Medication administration.  Evaluation of Outcomes: Not Met  Physician Treatment Plan for Secondary Diagnosis: Principal Problem:   Major depressive disorder, severe ([place])  Long Term Goal(s): Improvement in symptoms so as ready for discharge Improvement in symptoms so as ready for discharge   Short Term Goals: Ability to identify changes in lifestyle to reduce recurrence of condition will improve Ability to verbalize feelings will improve Ability to disclose and discuss suicidal ideas Ability to demonstrate self-control will improve Ability to identify and develop effective coping behaviors will improve Ability to maintain clinical measurements within normal limits will improve Compliance with prescribed medications will improve Ability to identify triggers associated with substance abuse/mental health issues  will improve Ability to identify changes in lifestyle to reduce recurrence of condition will improve Ability to verbalize feelings will improve Ability to disclose and discuss suicidal ideas Ability to demonstrate self-control will improve Ability to identify and develop effective coping behaviors will improve Ability to maintain clinical measurements within normal limits will improve Compliance with prescribed medications will improve Ability to identify triggers associated with substance abuse/mental health issues will improve     Medication Management: Evaluate patient's response, side effects, and tolerance of medication regimen.  Therapeutic Interventions: 1 to 1 sessions, Unit Group sessions and Medication administration.  Evaluation of Outcomes: Not Met   RN Treatment Plan for Primary Diagnosis: Major depressive disorder, severe ([place]) Long Term Goal(s): Knowledge of disease and therapeutic regimen to maintain health will improve  Short Term Goals: Ability to demonstrate self-control, Ability to participate in  decision making will improve and Ability to verbalize feelings will improve  Medication Management: RN will administer medications as ordered by provider, will assess and evaluate patient's response and provide education to patient for prescribed medication. RN will report any adverse and/or side effects to prescribing provider.  Therapeutic Interventions: 1 on 1 counseling sessions, Psychoeducation, Medication administration, Evaluate responses to treatment, Monitor vital signs and CBGs as ordered, Perform/monitor CIWA, COWS, AIMS and Fall Risk screenings as ordered, Perform wound care treatments as ordered.  Evaluation of Outcomes: Not Met   LCSW Treatment Plan for Primary Diagnosis: Major depressive disorder, severe ([place]) Long Term Goal(s): Safe transition to appropriate next level of care at discharge, Engage patient in therapeutic group addressing interpersonal concerns.  Short Term Goals: Engage patient in aftercare planning with referrals and resources, Increase social support and Increase ability to appropriately verbalize feelings  Therapeutic Interventions: Assess for all discharge needs, 1 to 1 time with Social worker, Explore available resources and support systems, Assess for adequacy in community support network, Educate family and significant other(s) on suicide prevention, Complete Psychosocial Assessment, Interpersonal group therapy.  Evaluation of Outcomes: Not Met   Progress in Treatment: Attending groups: Yes. Participating in groups: Yes. Taking medication as prescribed: Yes. Toleration medication: Yes. Family/Significant other contact made: No, will contact:  sister Patient understands diagnosis: Yes. Discussing patient identified problems/goals with staff: Yes. Medical problems stabilized or resolved: Yes. Denies suicidal/homicidal ideation: Yes. Issues/concerns per patient self-inventory: Yes. Other: None  New problem(s) identified: No, Describe:  None  New  Short Term/Long Term Goal(s):medication stabilization, elimination of SI thoughts, development of comprehensive mental wellness plan.  Patient Goals:  Unable to attend  Discharge Plan or Barriers: Patient recently admitted. CSW will continue to follow and assess for appropriate referrals and possible discharge planning.  Reason for Continuation of Hospitalization: Anxiety Depression Medication stabilization Suicidal ideation  Estimated Length of Stay:3-5 days  Attendees: Patient: 03/12/2021   Physician:  03/12/2021   Nursing:  03/12/2021   RN Care Manager: 03/12/2021   Social Worker: Toney Reil, Latanya Presser 03/12/2021   Recreational Therapist:  03/12/2021   Other:  03/12/2021   Other:  03/12/2021   Other: 03/12/2021     Scribe for Treatment Team: Mliss Fritz, Latanya Presser 03/12/2021 2:24 PM

## 2021-03-12 NOTE — [facility] Group Notes (Signed)
Type of Therapy and Topic: Group Therapy: Anger Management   Participation: Attended  Description of Group: In this group, patients will learn helpful strategies and techniques to manage anger, express anger in alternative ways, change hostile attitudes, and prevent aggressive acts, such as verbal abuse and violence.This group will be process-oriented and eductional, with patients participating in exploration of their own experiences as well as giving and receiving support and challenge from other group members.  Therapeutic Goals: 1. Patient will learn to manage anger. 2. Patient will learn to stop violence or the threat of violence. 3. Patient will learn to develop self control over thoughts and actions. 4. Patient will receive support and feedback from others  Therapeutic Modalities: Cognitive Behavioral Therapy Solution Focused Therapy Motivational Interviewing  Sagrario Lineberry, LCSWA Clinicial Social Worker [facility] 

## 2021-03-12 NOTE — Plan of Care (Signed)
  Problem: Education: Goal: Verbalization of understanding the information provided will improve Outcome: Progressing   Problem: Activity: Goal: Interest or engagement in activities will improve Outcome: Progressing Goal: Sleeping patterns will improve Outcome: Progressing

## 2021-03-13 LAB — GLUCOSE, CAPILLARY
Glucose-Capillary: 176 mg/dL — ABNORMAL HIGH (ref 70–99)
Glucose-Capillary: 223 mg/dL — ABNORMAL HIGH (ref 70–99)
Glucose-Capillary: 170 mg/dL — ABNORMAL HIGH (ref 70–99)

## 2021-03-13 MED ORDER — GABAPENTIN 300 MG PO CAPS
300.0000 mg | ORAL_CAPSULE | Freq: Three times a day (TID) | ORAL | Status: DC
  Administered 2021-03-13 – 2021-03-15 (×6): 300 mg via ORAL
  Filled 2021-03-13 (×14): qty 1

## 2021-03-13 NOTE — Progress Notes (Signed)
   03/12/21 2310  Psych Admission Type (Psych Patients Only)  Admission Status Voluntary  Psychosocial Assessment  Patient Complaints None  Eye Contact Fair  Facial Expression Anxious;Pensive  Affect Anxious  Speech Logical/coherent  Interaction Assertive  Motor Activity Slow  Appearance/Hygiene Unremarkable  Behavior Characteristics Cooperative  Mood Pleasant  Thought Process  Coherency Concrete thinking  Content WDL  Delusions None reported or observed  Perception WDL  Hallucination None reported or observed  Judgment Poor  Confusion None  Danger to Self  Current suicidal ideation? Denies  Self-Injurious Behavior No self-injurious ideation or behavior indicators observed or expressed   Agreement Not to Harm Self No  Description of Agreement verbal contract  Danger to Others  Danger to Others None reported or observed

## 2021-03-13 NOTE — [facility] Group Notes (Signed)
LCSW Group Therapy Note  03/13/2021     10:00-11:00AM  Type of Therapy and Topic:  Group Therapy:  Decisional Balance Exercise for Substance Use  Participation Level:  Active        . Description of Group:  The main focus of today's process group was learning how to use a decisional balance exercise to make a decision about whether to change an unhealthy coping skill, as well as how to use the information gathered in the actual process of planning that change.  Stages of Change were explained, then patients listed some of their most frequently utilized unhealthy coping techniques and CSW pointed out the similarities.  Motivational Interviewing and the whiteboard were utilized to help patients explore in-depth the perceived benefits and costs of a specific, shared unhealthy coping technique (drinking & drugging) as well as the benefits and costs of replacing that with other, healthy coping skills.    Therapeutic Goals Patient will be able to utilize the decision balance exercise on their own Patient will list coping skills they use to fulfill their needs Patient will identify the differences between healthy and  unhealthy coping skills Patient will verbalize the costs and benefits of drinking/drugging versus making the choice to change Patient will learn how to use the exercise to identify the most important supports to put in place so that they can succeed in a change to which they commit  Summary of Patient Progress: During group, patient expressed that he self-sabotages with alcohol.  He was one of the most participatory group members and each of his comments had meaning.  He appeared to be insightful and was appropriate at all times.   Therapeutic Modalities Cognitive Behavioral Therapy Motivational Interviewing   Maretta Los, LCSW

## 2021-03-13 NOTE — Progress Notes (Signed)
[place] NOVEL CORONAVIRUS (COVID-19) DAILY CHECK-OFF SYMPTOMS - answer yes or no to each - every day NO YES  Have you had a fever in the past 24 hours?  . Fever (Temp > 37.80C / 100F) X   Have you had any of these symptoms in the past 24 hours? . New Cough .  Sore Throat  .  Shortness of Breath .  Difficulty Breathing .  Unexplained Body Aches   X   Have you had any one of these symptoms in the past 24 hours not related to allergies?   . Runny Nose .  Nasal Congestion .  Sneezing   X   If you have had runny nose, nasal congestion, sneezing in the past 24 hours, has it worsened?  X   EXPOSURES - check yes or no X   Have you traveled outside the state in the past 14 days?  X   Have you been in contact with someone with a confirmed diagnosis of COVID-19 or PUI in the past 14 days without wearing appropriate PPE?  X   Have you been living in the same home as a person with confirmed diagnosis of COVID-19 or a PUI (household contact)?    X   Have you been diagnosed with COVID-19?    X              What to do next: Answered NO to all: Answered YES to anything:   Proceed with unit schedule Follow the [facility] Inpatient Flowsheet.   
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
## 2021-03-13 NOTE — Progress Notes (Signed)
   03/13/21 1500  Psych Admission Type (Psych Patients Only)  Admission Status Voluntary  Psychosocial Assessment  Patient Complaints None  Eye Contact Fair  Facial Expression Anxious;Pensive  Affect Anxious  Speech Logical/coherent  Interaction Assertive  Motor Activity Slow  Appearance/Hygiene Unremarkable  Behavior Characteristics Cooperative  Mood Pleasant  Thought Process  Coherency Concrete thinking  Content WDL  Delusions None reported or observed  Perception WDL  Hallucination None reported or observed  Judgment Poor  Confusion None  Danger to Self  Current suicidal ideation? Denies  Self-Injurious Behavior No self-injurious ideation or behavior indicators observed or expressed   Agreement Not to Harm Self No  Description of Agreement verbal contract  Danger to Others  Danger to Others None reported or observed

## 2021-03-13 NOTE — Progress Notes (Signed)
Adult Psychoeducational Group Note  Date:  03/13/2021 Time:  11:23 PM  Group Topic/Focus:  Wrap-Up Group:   The focus of this group is to help patients review their daily goal of treatment and discuss progress on daily workbooks.  Participation Level:  Minimal  Participation Quality:  Appropriate  Affect:  Appropriate  Cognitive:  Oriented  Insight: Appropriate and Good  Engagement in Group:  Engaged  Modes of Intervention:  Education and Support  Additional Comments:  Patient attended and participatedin group tonight.  Salley Scarlet [facility] 03/13/2021, 11:23 PM

## 2021-03-14 LAB — GLUCOSE, CAPILLARY
Glucose-Capillary: 216 mg/dL — ABNORMAL HIGH (ref 70–99)
Glucose-Capillary: 190 mg/dL — ABNORMAL HIGH (ref 70–99)
Glucose-Capillary: 325 mg/dL — ABNORMAL HIGH (ref 70–99)
Glucose-Capillary: 260 mg/dL — ABNORMAL HIGH (ref 70–99)

## 2021-03-14 NOTE — Progress Notes (Signed)
DAR NOTE: Patient presents with calm  affect and pleasant mood.  Denies pain, auditory and visual hallucinations.  Maintained on routine safety checks.  Medications given as prescribed.  Support and encouragement offered as needed.  Attended group and participated.  Will continue to monitor.

## 2021-03-14 NOTE — Progress Notes (Signed)
D. Pt presents as friendly,smiling during interactions- reported an improved mood- stating, "my thoughts are much clearer today".  Per pt's self inventory, pt rated his depression, hopelessness and anxiety all 3's today. Pt reported that his goal was to work on getting more rest and relaxation. Pt observed in the dayroom watching a movie with his peers this am.  Pt currently denies pain, SI/HI and AVH  A. Labs and vitals monitored. Pt given and educated on medications. Pt supported emotionally and encouraged to express concerns and ask questions.   R. Pt remains safe with 15 minute checks. Will continue POC.
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
## 2021-03-14 NOTE — Progress Notes (Signed)
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
## 2021-03-15 LAB — GLUCOSE, CAPILLARY
Glucose-Capillary: 130 mg/dL — ABNORMAL HIGH (ref 70–99)
Glucose-Capillary: 168 mg/dL — ABNORMAL HIGH (ref 70–99)
Glucose-Capillary: 192 mg/dL — ABNORMAL HIGH (ref 70–99)

## 2021-03-15 MED ORDER — ATORVASTATIN CALCIUM 40 MG PO TABS: 40.0000 mg | ORAL_TABLET | Freq: Every day | ORAL | 0 refills | Status: DC

## 2021-03-15 MED ORDER — METFORMIN HCL 1000 MG PO TABS: 1000.0000 mg | ORAL_TABLET | Freq: Two times a day (BID) | ORAL | 0 refills | Status: DC

## 2021-03-15 MED ORDER — ESCITALOPRAM OXALATE 20 MG PO TABS: 20.0000 mg | ORAL_TABLET | Freq: Every day | ORAL | 0 refills | Status: DC

## 2021-03-15 MED ORDER — ASPIRIN 81 MG PO TBEC: 81.0000 mg | DELAYED_RELEASE_TABLET | Freq: Every day | ORAL | 11 refills | Status: DC

## 2021-03-15 MED ORDER — HYDROXYZINE HCL 25 MG PO TABS: ORAL_TABLET | ORAL | 0 refills | Status: DC

## 2021-03-15 MED ORDER — PANTOPRAZOLE SODIUM 40 MG PO TBEC: 40.0000 mg | DELAYED_RELEASE_TABLET | Freq: Every day | ORAL | 0 refills | Status: DC

## 2021-03-15 MED ORDER — GABAPENTIN 300 MG PO CAPS: 300.0000 mg | ORAL_CAPSULE | Freq: Three times a day (TID) | ORAL | 0 refills | Status: DC

## 2021-03-15 MED ORDER — TRAZODONE HCL 50 MG PO TABS: 50.0000 mg | ORAL_TABLET | Freq: Every evening | ORAL | 0 refills | Status: DC | PRN

## 2021-03-15 NOTE — Progress Notes (Signed)
Recreation Therapy Notes  Date: 4.25.22 Time: 0930 Location: [address] Dayroom  Group Topic: Stress Management  Goal Area(s) Addresses:  Patient will identify positive stress management techniques. Patient will identify benefits of using stress management post d/c.  Behavioral Response: Engaged  Intervention: Stress Management  Activity: Meditation.  LRT played a meditation that focused on making the most of your day and putting an emphasis on things they wish to accomplish throughout the day.    Education:  Stress Management, Discharge Planning.   Education Outcome: Acknowledges Education  Clinical Observations/Feedback:  Pt attended and participated in group session.    Victorino Sparrow, LRT/CTRS         Victorino Sparrow A 03/15/2021 11:53 AM

## 2021-03-15 NOTE — Progress Notes (Signed)
RN met with pt and reviewed pt's discharge instructions.  Pt verbalized understanding of discharge instructions and pt did not have any questions. RN reviewed and provided pt with a copy of SRA, AVS and Transition Record.  RN returned pt's belongings to pt.  Pt denied SI/HI/AVH and voiced no concerns.  Pt was appreciative of the care pt received at [facility].  Patient discharged to the lobby without incident. 
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
## 2021-03-15 NOTE — Progress Notes (Signed)
  [facility] Adult Case Management Discharge Plan :  Will you be returning to the same living situation after discharge:  Yes,  sister's home At discharge, do you have transportation home?: Yes,  bus or sister Do you have the ability to pay for your medications: Yes,  has insurance  Release of information consent forms completed and in the chart;  Patient's signature needed at discharge.  Patient to Follow up at:  [name] Follow up.   Specialty: Behavioral Health Why: You have an intake appointment for SAIOP services on 5/5 at 10am. This appointment will be held in person.  Contact information: [place] [phone]              Next level of care provider has access to [place] and Suicide Prevention discussed: Yes,  w/ sister  Have you used any form of tobacco in the last 30 days? (Cigarettes, Smokeless Tobacco, Cigars, and/or Pipes): Yes  Has patient been referred to the Quitline?: Patient refused referral  Patient has been referred for addiction treatment: Yes  Mliss Fritz, [place] 03/15/2021, 10:25 AM

## 2021-03-15 NOTE — BHH Suicide Risk Assessment (Signed)
[facility] Discharge Suicide Risk Assessment   Principal Problem: Major depressive disorder, severe ([place]) Discharge Diagnoses: Principal Problem:   Major depressive disorder, severe ([place])   Total Time spent with patient: 20 minutes  Musculoskeletal: Strength & Muscle Tone: within normal limits Gait & Station: normal Patient leans: N/A  Psychiatric Specialty Exam: Review of Systems  Constitutional: Negative for fever.  HENT: Negative for sneezing.   Eyes: Negative for discharge.  Respiratory: Negative for cough and shortness of breath.   Cardiovascular: Negative for chest pain.  Gastrointestinal: Negative for nausea and vomiting.  Genitourinary: Negative for difficulty urinating.  Musculoskeletal: Negative for gait problem.  Neurological: Negative for tremors and headaches.  Psychiatric/Behavioral: Negative for dysphoric mood, hallucinations, self-injury and suicidal ideas. The patient is not nervous/anxious.     Blood pressure 115/70, pulse 70, temperature 98.1 F (36.7 C), temperature source Oral, resp. rate 18, height 5\' 8"  (1.727 m), weight 67.1 kg, SpO2 100 %.Body mass index is 22.5 kg/m.  General Appearance: Casual and Well Groomed  Eye Contact::  Good  Speech:  Clear and Coherent and Normal Rate  Volume:  Normal  Mood:  Euthymic  Affect:  Appropriate and Congruent  Thought Process:  Coherent, Goal Directed and Linear  Orientation:  Full (Time, Place, and Person)  Thought Content:  Logical  Suicidal Thoughts:  No  Homicidal Thoughts:  No  Memory:  Immediate;   Good Recent;   Good Remote;   Good  Judgement:  Good  Insight:  Good  Psychomotor Activity:  Normal  Concentration:  Good  Recall:  Good  Fund of Knowledge:Good  Language: Good  Akathisia:  No    AIMS (if indicated):     Assets:  Communication Skills Desire for Improvement Housing Leisure Time Physical Health Resilience Social Support Transportation Vocational/Educational  Sleep:  Number of Hours:  6.25  Cognition: WNL  ADL's:  Intact   Mental Status Per Nursing Assessment::   On Admission:  NA  Demographic Factors:  Male  Loss Factors: NA  Historical Factors: Prior suicide attempts, Family history of mental illness or substance abuse and Impulsivity  Risk Reduction Factors:   Sense of responsibility to family, Employed, Living with another person, especially a relative and Positive social support  Continued Clinical Symptoms:  Panic Attacks - improved Depression - improved Alcohol/Substance Abuse/Dependencies More than one psychiatric diagnosis Previous Psychiatric Diagnoses and Treatments Medical Diagnoses and Treatments/Surgeries  Cognitive Features That Contribute To Risk:  None    Suicide Risk:  Mild:  Suicidal ideation of limited frequency, intensity, duration, and specificity.  There are no identifiable plans, no associated intent, mild dysphoria and related symptoms, good self-control (both objective and subjective assessment), few other risk factors, and identifiable protective factors, including available and accessible social support.   [facility] Follow up.   Specialty: Behavioral Health Why: You have an intake appointment for SAIOP services on 5/5 at 10am. This appointment will be held in person.  Contact information: [place] [phone]              Plan Of Care/Follow-up recommendations:  Activity:  as tolerated  Other:   - Take medications as prescribed.   - Keep outpatient psychiatry and outpatient therapy follow-up appointments.   - Attend outpatient substance abuse treatment program.   - Do not drink alcohol.  Do not use marijuana or other drugs.   - Follow-up with primary care provider regarding treatment of  diabetes mellitus, hypertension and any other medical problems or issues.  Arthor Captain, MD 03/15/2021, 11:07 AM

## 2021-03-15 NOTE — BHH Group Notes (Signed)
The focus of this group is to help patients establish daily goals to achieve during treatment and discuss how the patient can incorporate goal setting into their daily lives to aide in recovery.  Pt attended group, goal is to get out today  Stay clean

## 2021-03-15 NOTE — Progress Notes (Signed)
   03/14/21 2223  Psych Admission Type (Psych Patients Only)  Admission Status Voluntary  Psychosocial Assessment  Patient Complaints None  Eye Contact Fair  Facial Expression Anxious;Pensive  Affect Anxious  Speech Logical/coherent  Interaction Assertive  Motor Activity Slow  Appearance/Hygiene Unremarkable  Behavior Characteristics Cooperative;Calm  Mood Pleasant  Thought Process  Coherency Concrete thinking  Content WDL  Delusions None reported or observed  Perception WDL  Hallucination None reported or observed  Judgment Poor  Confusion None  Danger to Self  Current suicidal ideation? Denies  Self-Injurious Behavior No self-injurious ideation or behavior indicators observed or expressed   Agreement Not to Harm Self No  Description of Agreement verbal contract  Danger to Others  Danger to Others None reported or observed

## 2021-03-15 NOTE — BHH Suicide Risk Assessment (Signed)
[place] INPATIENT:  Family/Significant Other Suicide Prevention Education  Suicide Prevention Education:  Education Completed; Samuel [name] (Sister)  [phone] ,  (name of family member/significant other) has been identified by the patient as the family member/significant other with whom the patient will be residing, and identified as the person(s) who will aid the patient in the event of a mental health crisis (suicidal ideations/suicide attempt).  With written consent from the patient, the family member/significant other has been provided the following suicide prevention education, prior to the and/or following the discharge of the patient.  The suicide prevention education provided includes the following:  Suicide risk factors  Suicide prevention and interventions  National Suicide Hotline telephone number  [facility] assessment telephone number  [facility] Emergency Assistance [place] and/or Residential Mobile Crisis Unit telephone number  Request made of family/significant other to:  Remove weapons (e.g., guns, rifles, knives), all items previously/currently identified as safety concern.    Remove drugs/medications (over-the-counter, prescriptions, illicit drugs), all items previously/currently identified as a safety concern.  The family member/significant other verbalizes understanding of the suicide prevention education information provided.  The family member/significant other agrees to remove the items of safety concern listed above.  No safety concerns noted. CSW informed sister of SAIOP follow-up in place. Sister states that there are weapons in the home but that they are secured.   Mliss Fritz 03/15/2021, 10:24 AM

## 2021-03-17 NOTE — BHH Group Notes (Signed)
ADULT GRIEF GROUP NOTE:   Spiritual care group on grief and loss facilitated by Kathrynn Humble, Bcc   Group Goal:   Support / Education around grief and loss   Members engage in facilitated group support and psycho-social education.   Group Description:   Following introductions and group rules, group members engaged in facilitated group dialog and support around topic of loss, with particular support around experiences of loss in their lives. Group Identified types of loss (relationships / self / things) and identified patterns, circumstances, and changes that precipitate losses. Reflected on thoughts / feelings around loss, normalized grief responses, and recognized variety in grief experience. Group noted Worden's four tasks of grief in discussion.   Group drew on Adlerian / Rogerian, narrative, MI,   Patient Progress:  Samuel [name] attended and participated in group.  He shared about loss of self and was able to articulate feelings related to his experience.  He showed compassion to other group members as they shared and actively participated in conversation.  [place], [place] Pager, [phone] 10:29 AM

## 2021-03-22 NOTE — Progress Notes (Signed)
Patient did not show for appointment.   

## 2021-03-23 ENCOUNTER — Encounter: Payer: BLUE CROSS/BLUE SHIELD | Admitting: Family

## 2021-03-23 DIAGNOSIS — E1169 Type 2 diabetes mellitus with other specified complication: Secondary | ICD-10-CM

## 2021-03-23 DIAGNOSIS — Z13220 Encounter for screening for lipoid disorders: Secondary | ICD-10-CM

## 2021-03-23 DIAGNOSIS — Z0000 Encounter for general adult medical examination without abnormal findings: Secondary | ICD-10-CM

## 2021-03-23 DIAGNOSIS — Z1329 Encounter for screening for other suspected endocrine disorder: Secondary | ICD-10-CM

## 2021-03-23 DIAGNOSIS — E785 Hyperlipidemia, unspecified: Secondary | ICD-10-CM

## 2021-03-23 DIAGNOSIS — Z130 Encounter for screening for diseases of the blood and blood-forming organs and certain disorders involving the immune mechanism: Secondary | ICD-10-CM

## 2021-03-23 DIAGNOSIS — F322 Major depressive disorder, single episode, severe without psychotic features: Secondary | ICD-10-CM

## 2021-03-23 DIAGNOSIS — Z13228 Encounter for screening for other metabolic disorders: Secondary | ICD-10-CM

## 2021-03-25 ENCOUNTER — Other Ambulatory Visit: Payer: Self-pay

## 2021-03-25 ENCOUNTER — Ambulatory Visit (INDEPENDENT_AMBULATORY_CARE_PROVIDER_SITE_OTHER): Payer: BLUE CROSS/BLUE SHIELD | Admitting: Behavioral Health

## 2021-03-25 ENCOUNTER — Telehealth (HOSPITAL_COMMUNITY): Payer: Self-pay | Admitting: Behavioral Health

## 2021-03-25 DIAGNOSIS — F1020 Alcohol dependence, uncomplicated: Secondary | ICD-10-CM | POA: Diagnosis not present

## 2021-03-25 DIAGNOSIS — F322 Major depressive disorder, single episode, severe without psychotic features: Secondary | ICD-10-CM

## 2021-03-25 NOTE — Progress Notes (Signed)
Samuel [name] is a 55 year old male who presents to [facility] outpatient for a scheduled SAIOP intake appointment with this Probation officer. Client reports alcohol to be his drug(s) of choice. Client was assessed on 03/11/2021 and meets criteria for ASAM Level 2.1 treatment. Samuel [name] is being recommended for Substance Abuse Intensive Outpatient Program (SAIOP). Client completed a person-centered treatment plan. Client has agreed to attend [place] group therapy on Mondays, Wednesdays, and Fridays from 9:00am - 12:00pm. Group rules and group expectations were discussed with client during today's session. Client did not express any concerns regarding group rules and expectations.    SAIOP Start Date:  03/29/2021

## 2021-03-26 ENCOUNTER — Other Ambulatory Visit (HOSPITAL_COMMUNITY): Payer: Self-pay | Admitting: Physician Assistant

## 2021-03-26 DIAGNOSIS — F411 Generalized anxiety disorder: Secondary | ICD-10-CM

## 2021-03-26 DIAGNOSIS — G4700 Insomnia, unspecified: Secondary | ICD-10-CM

## 2021-03-26 DIAGNOSIS — F322 Major depressive disorder, single episode, severe without psychotic features: Secondary | ICD-10-CM

## 2021-03-26 DIAGNOSIS — E785 Hyperlipidemia, unspecified: Secondary | ICD-10-CM

## 2021-03-26 DIAGNOSIS — K219 Gastro-esophageal reflux disease without esophagitis: Secondary | ICD-10-CM

## 2021-03-26 DIAGNOSIS — Z794 Long term (current) use of insulin: Secondary | ICD-10-CM

## 2021-03-26 DIAGNOSIS — E1169 Type 2 diabetes mellitus with other specified complication: Secondary | ICD-10-CM

## 2021-03-26 MED ORDER — ESCITALOPRAM OXALATE 20 MG PO TABS
20.0000 mg | ORAL_TABLET | Freq: Every day | ORAL | 0 refills | Status: DC
Start: 2021-03-26 — End: 2021-04-21

## 2021-03-26 MED ORDER — HYDROXYZINE HCL 25 MG PO TABS: ORAL_TABLET | ORAL | 0 refills | Status: DC

## 2021-03-26 MED ORDER — ASPIRIN 81 MG PO TBEC: 81.0000 mg | DELAYED_RELEASE_TABLET | Freq: Every day | ORAL | 11 refills | Status: DC

## 2021-03-26 MED ORDER — ATORVASTATIN CALCIUM 40 MG PO TABS: 40.0000 mg | ORAL_TABLET | Freq: Every day | ORAL | 0 refills | Status: DC

## 2021-03-26 MED ORDER — METFORMIN HCL 1000 MG PO TABS: 1000.0000 mg | ORAL_TABLET | Freq: Two times a day (BID) | ORAL | 0 refills | Status: DC

## 2021-03-26 MED ORDER — PANTOPRAZOLE SODIUM 40 MG PO TBEC
40.0000 mg | DELAYED_RELEASE_TABLET | Freq: Every day | ORAL | 0 refills | Status: DC
Start: 2021-03-26 — End: 2021-04-27

## 2021-03-26 MED ORDER — TRAZODONE HCL 50 MG PO TABS: 50.0000 mg | ORAL_TABLET | Freq: Every evening | ORAL | 0 refills | Status: DC | PRN

## 2021-03-26 NOTE — Progress Notes (Signed)
Patient informed Licensed Clinical Social Worker Field seismologist) that medications were e-prescribed to incorrect pharmacy upon discharge from [facility]. Patient request that medications be sent to [facility] on [address].  The following medications were e-prescribed to pharmacy of choice:  1. Type 2 diabetes mellitus with other specified complication, with long-term current use of insulin (HCC)  - metFORMIN (GLUCOPHAGE) 1000 MG tablet; Take 1 tablet (1,000 mg total) by mouth 2 (two) times daily.  Dispense: 60 tablet; Refill: 0  2. Gastroesophageal reflux disease without esophagitis  - pantoprazole (PROTONIX) 40 MG tablet; Take 1 tablet (40 mg total) by mouth daily.  Dispense: 30 tablet; Refill: 0  3. Hyperlipidemia, unspecified hyperlipidemia type  - aspirin 81 MG EC tablet; Take 1 tablet (81 mg total) by mouth daily. Swallow whole.  Dispense: 30 tablet; Refill: 11 - atorvastatin (LIPITOR) 40 MG tablet; Take 1 tablet (40 mg total) by mouth daily.  Dispense: 30 tablet; Refill: 0  4. Major depressive disorder, severe (HCC)  - escitalopram (LEXAPRO) 20 MG tablet; Take 1 tablet (20 mg total) by mouth daily.  Dispense: 30 tablet; Refill: 0  5. GAD (generalized anxiety disorder)  - escitalopram (LEXAPRO) 20 MG tablet; Take 1 tablet (20 mg total) by mouth daily.  Dispense: 30 tablet; Refill: 0 - hydrOXYzine (ATARAX/VISTARIL) 25 MG tablet; TAKE 1 TABLET (25 MG TOTAL) BY MOUTH EVERY 6 (SIX) HOURS AS NEEDED.  Dispense: 30 tablet; Refill: 0  Patient plans on attending group on Monday morning. Provider will follow up with therapist to see if patient received his medications.  6. Insomnia, unspecified type  - traZODone (DESYREL) 50 MG tablet; Take 1 tablet (50 mg total) by mouth at bedtime as needed for sleep.  Dispense: 30 tablet; Refill: 0

## 2021-03-29 ENCOUNTER — Ambulatory Visit (HOSPITAL_COMMUNITY): Payer: BLUE CROSS/BLUE SHIELD | Admitting: Behavioral Health

## 2021-03-29 ENCOUNTER — Other Ambulatory Visit: Payer: Self-pay

## 2021-03-29 NOTE — Group Note (Unsigned)
Group Topic: General Coping Skills  Group Date: 03/29/2021 Start Time:  9:00 AM End Time: 12:00 PM Facilitators: Allyne Gee, Counselor  Department: [facility]  Number of Participants: 4  Group Focus: affirmation, anxiety, communication, coping skills, depression, family, feeling awareness/expression, forgiveness, goals/reality orientation, and relapse prevention Treatment Modality:  Cognitive Behavioral Therapy Interventions utilized were clarification, confrontation, and exploration Purpose: enhance coping skills, explore maladaptive thinking, express feelings, express irrational fears, improve communication skills, increase insight, regain self-worth, reinforce self-care, relapse prevention strategies, and trigger / craving management   Name: Samuel [name] Date of Birth: 1966-03-24  MR: [id number]    Level of Participation: {THERAPIES; PSYCH GROUP PARTICIPATION NOIBB:04888} Quality of Participation: {THERAPIES; PSYCH QUALITY OF PARTICIPATION:23992} Interactions with others: {THERAPIES; PSYCH INTERACTIONS:23993} Mood/Affect: {THERAPIES; PSYCH MOOD/AFFECT:23994} Triggers (if applicable): *** Cognition: {THERAPIES; PSYCH COGNITION:23995} Progress: {THERAPIES; PSYCH PROGRESS:23997} Response: *** Plan: {THERAPIES; PSYCH BVQX:45038}  Patients Problems:  Patient Active Problem List   Diagnosis Date Noted   Major depressive disorder, severe ([place]) 03/10/2021   Suicidal ideation    GAD (generalized anxiety disorder)    Hyperlipidemia 12/26/2020   Vitamin D deficiency 12/26/2020   Insomnia 12/26/2020   Neuropathy 12/26/2020   Gastroesophageal reflux disease without esophagitis 12/26/2020   Nausea and vomiting 03/20/2020   Pancreatitis 03/20/2020   Other male erectile dysfunction 11/20/2019   Microalbuminuria due to type 2 diabetes mellitus ([place]) 09/10/2019   Tobacco dependence 08/13/2019   DM type 2 with diabetic peripheral neuropathy ([place])  08/13/2019   Clavicle fracture 02/28/2017   Alcohol withdrawal ([place]) 12/26/2016   Alcohol use disorder, severe, dependence ([place]) 12/26/2016   Major depressive disorder 12/26/2016   Diabetes ([place]) 12/26/2016   Tobacco use disorder 12/26/2016     Family Program: Family present? {BHH YES OR NO:22294}   Name of family member(s): ***  UDS collected: {BHH YES OR NO:22294} Results: {Findings; urine drug screen:60936}  AA/NA attended?: {BHH YES OR NO:22294}{DAYS OF UEKC:00349}  Sponsor?: {BHH YES OR ZP:91505}

## 2021-04-14 ENCOUNTER — Ambulatory Visit: Payer: Medicaid Other | Admitting: Pharmacist

## 2021-04-16 ENCOUNTER — Ambulatory Visit (HOSPITAL_COMMUNITY): Payer: BLUE CROSS/BLUE SHIELD | Admitting: Behavioral Health

## 2021-04-16 ENCOUNTER — Other Ambulatory Visit: Payer: Self-pay

## 2021-04-21 ENCOUNTER — Encounter (HOSPITAL_COMMUNITY): Payer: Self-pay

## 2021-04-21 ENCOUNTER — Other Ambulatory Visit: Payer: Self-pay

## 2021-04-21 ENCOUNTER — Ambulatory Visit (INDEPENDENT_AMBULATORY_CARE_PROVIDER_SITE_OTHER): Payer: BLUE CROSS/BLUE SHIELD | Admitting: Behavioral Health

## 2021-04-21 DIAGNOSIS — F19980 Other psychoactive substance use, unspecified with psychoactive substance-induced anxiety disorder: Secondary | ICD-10-CM

## 2021-04-21 DIAGNOSIS — F341 Dysthymic disorder: Secondary | ICD-10-CM | POA: Diagnosis not present

## 2021-04-21 DIAGNOSIS — F1994 Other psychoactive substance use, unspecified with psychoactive substance-induced mood disorder: Secondary | ICD-10-CM

## 2021-04-21 DIAGNOSIS — F515 Nightmare disorder: Secondary | ICD-10-CM

## 2021-04-21 DIAGNOSIS — F1020 Alcohol dependence, uncomplicated: Secondary | ICD-10-CM

## 2021-04-21 DIAGNOSIS — Z811 Family history of alcohol abuse and dependence: Secondary | ICD-10-CM | POA: Diagnosis not present

## 2021-04-21 DIAGNOSIS — F4312 Post-traumatic stress disorder, chronic: Secondary | ICD-10-CM | POA: Diagnosis not present

## 2021-04-21 DIAGNOSIS — F411 Generalized anxiety disorder: Secondary | ICD-10-CM

## 2021-04-21 DIAGNOSIS — F322 Major depressive disorder, single episode, severe without psychotic features: Secondary | ICD-10-CM

## 2021-04-21 DIAGNOSIS — G4700 Insomnia, unspecified: Secondary | ICD-10-CM

## 2021-04-21 MED ORDER — GABAPENTIN 300 MG PO CAPS
300.0000 mg | ORAL_CAPSULE | Freq: Three times a day (TID) | ORAL | 0 refills | Status: DC
  Filled 2021-04-21: qty 90, 30d supply, fill #0

## 2021-04-21 MED ORDER — TRAZODONE HCL 50 MG PO TABS
50.0000 mg | ORAL_TABLET | Freq: Every evening | ORAL | 0 refills | Status: DC | PRN
  Filled 2021-04-21: qty 30, 30d supply, fill #0

## 2021-04-21 MED ORDER — ESCITALOPRAM OXALATE 20 MG PO TABS
20.0000 mg | ORAL_TABLET | Freq: Every day | ORAL | 0 refills | Status: DC
  Filled 2021-04-21: qty 30, 30d supply, fill #0

## 2021-04-21 MED ORDER — HYDROXYZINE HCL 25 MG PO TABS
ORAL_TABLET | ORAL | 0 refills | Status: DC
  Filled 2021-04-21: qty 30, 7d supply, fill #0

## 2021-04-21 MED ORDER — BACLOFEN 10 MG PO TABS
10.0000 mg | ORAL_TABLET | Freq: Three times a day (TID) | ORAL | 1 refills | Status: AC
  Filled 2021-04-21: qty 90, 30d supply, fill #0

## 2021-04-21 NOTE — Group Note (Signed)
Group Topic: Cognitive Distortion  Group Date: 04/21/2021 Start Time:  9:00 AM End Time: 12:00 PM Facilitators: Allyne Gee, Counselor  Department: [facility]  Number of Participants: 2  Group Focus: acceptance, affirmation, anger management, anxiety, check in, clarity of thought, co-dependency, communication, coping skills, depression, family, impulsivity, leisure skills, personal responsibility, and relapse prevention Treatment Modality:  Cognitive Behavioral Therapy Interventions utilized were clarification, confrontation, exploration, story telling, and support Purpose: enhance coping skills, explore maladaptive thinking, express feelings, express irrational fears, improve communication skills, increase insight, regain self-worth, reinforce self-care, relapse prevention strategies, and trigger / craving management  Name: Samuel [name] Date of Birth: [birth year]  MR: [id number]    Level of Participation: active Quality of Participation: attentive, cooperative and offered feedback Interactions with others: gave feedback Mood/Affect: appropriate Triggers (if applicable): N/A Cognition: coherent/clear Progress: Gaining insight Response: Clt checked-in by sharing "I've been pretty good since Friday. I've been even kill. I'm conscience when I binge drink. I bought 3 beers and I noticed that on Monday I had 1 beer remaining". Clt appeared to be justifying that he refrained from drinking an extra beer he had purchased. Group leader asked clt to process if he relapsed. Clt did not see this as a relapse. Clt appeared to believe that he was doing well by not drinking the last beer he had in his fridge. Continue to monitor. 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 No   Name of family member(s): N/A  UDS collected: No Results: N/A  AA/NA attended?: No  Sponsor?: No
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
## 2021-04-27 ENCOUNTER — Other Ambulatory Visit: Payer: Self-pay

## 2021-04-27 ENCOUNTER — Other Ambulatory Visit: Payer: Self-pay | Admitting: Pharmacist

## 2021-04-27 ENCOUNTER — Ambulatory Visit: Payer: BLUE CROSS/BLUE SHIELD | Attending: Family | Admitting: Pharmacist

## 2021-04-27 DIAGNOSIS — E785 Hyperlipidemia, unspecified: Secondary | ICD-10-CM

## 2021-04-27 DIAGNOSIS — E1142 Type 2 diabetes mellitus with diabetic polyneuropathy: Secondary | ICD-10-CM

## 2021-04-27 DIAGNOSIS — K219 Gastro-esophageal reflux disease without esophagitis: Secondary | ICD-10-CM

## 2021-04-27 DIAGNOSIS — Z794 Long term (current) use of insulin: Secondary | ICD-10-CM

## 2021-04-27 LAB — GLUCOSE, POCT (MANUAL RESULT ENTRY): POC Glucose: 591 mg/dl — AB (ref 70–99)

## 2021-04-27 MED ORDER — INSULIN GLARGINE 100 UNIT/ML SOLOSTAR PEN
PEN_INJECTOR | SUBCUTANEOUS | 1 refills | Status: DC
  Filled 2021-04-27: qty 9, 32d supply, fill #0
  Filled 2021-04-27: qty 6, 21d supply, fill #0

## 2021-04-27 MED ORDER — METFORMIN HCL 1000 MG PO TABS
1000.0000 mg | ORAL_TABLET | Freq: Two times a day (BID) | ORAL | 0 refills | Status: DC
  Filled 2021-04-27: qty 60, 30d supply, fill #0

## 2021-04-27 MED ORDER — PANTOPRAZOLE SODIUM 40 MG PO TBEC
40.0000 mg | DELAYED_RELEASE_TABLET | Freq: Every day | ORAL | 0 refills | Status: DC
  Filled 2021-04-27: qty 30, 30d supply, fill #0

## 2021-04-27 MED ORDER — ATORVASTATIN CALCIUM 40 MG PO TABS
40.0000 mg | ORAL_TABLET | Freq: Every day | ORAL | 0 refills | Status: DC
  Filled 2021-04-27: qty 30, 30d supply, fill #0

## 2021-04-27 NOTE — Progress Notes (Signed)
S:    PCP: Durene Fruits  No chief complaint on file.  Patient arrives in good spirits. Presents for diabetes evaluation, education, and management. Patient was referred and last seen by Primary Care Provider on 01/20/2021.  Last seen by CPP on 02/12/21. At this visit, patient reported poor medication adherence with BG 406 with trace ketones in urine. Patient sent to ED for evaluation - he received IV fluids and insulin in ED. Discharged shortly after with no medication changes.   Today, patient reports not having any medications at all for the past 2 months (since being seen at the behavorial health facility). Patient states he has not had any medications due to affordability issues and insurance discrepancies. Overall, patient appears fatigued and drowsy. He admits to frequent alcohol use; last drink this AM at 10 am (40 oz beer). Reports blurry vision and tingling in feet.  BP taken at today's visit (92/60 mmHg). BG was 591. Appears that patient is highly dehydrated. We strongly recommended that patient leave clinic and go to ED. Patient refused, and verbalized understanding of risks of not going to be appropriately treated. Patient strongly encouraged to seek emergency help if he starts to experience chest pain or severe dizziness once leaving the clinic.   Patient reports Diabetes was diagnosed in ~2011.   Family/Social History:  - FHx: no pertinent positives  - Tobacco: current 1 PPD - Alcohol: suffers from alcohol use disorder  Insurance coverage/medication affordability: Medicaid   Medication adherence denied.   Current diabetes medications include: lantus 28 units daily, metformin 1000 mg BID (not taking)  Patient denies hypoglycemic events.   Patient denies nocturia (nighttime urination).  Patient reports neuropathy (nerve pain). Patient reports visual changes. Patient denies self foot exams.     O:  POCT: 332  Lab Results  Component Value Date   HGBA1C 9.3 (H)  03/09/2021   There were no vitals filed for this visit.  Lipid Panel     Component Value Date/Time   CHOL 139 03/11/2021 1843   CHOL 156 12/24/2020 1144   TRIG 117 03/11/2021 1843   HDL 58 03/11/2021 1843   HDL 55 12/24/2020 1144   CHOLHDL 2.4 03/11/2021 1843   VLDL 23 03/11/2021 1843   LDLCALC 58 03/11/2021 1843   LDLCALC 89 12/24/2020 1144   Home fasting blood sugars: not checking  2 hour post-meal/random blood sugars: not checking.  Clinical Atherosclerotic Cardiovascular Disease (ASCVD): No  The 10-year ASCVD risk score Mikey Bussing DC Jr., et al., 2013) is: 15.5%   Values used to calculate the score:     Age: 77 years     Sex: Male     Is Non-Hispanic African American: Yes     Diabetic: Yes     Tobacco smoker: Yes     Systolic Blood Pressure: 951 mmHg     Is BP treated: No     HDL Cholesterol: 58 mg/dL     Total Cholesterol: 139 mg/dL    A/P: Diabetes longstanding currently uncontrolled. Complicated by alcohol use disorder. Patient is able to verbalize appropriate hypoglycemia management plan. Medication adherence denied. States he has not been able to access medications due to cost and insurance issues. We are sending all scripts to [facility] today. I have confirmed all medications were picked up today after visit. Due to significantly high BG, we strongly recommended that patient leave clinic and go to ED. Patient refused, and verbalized understanding of risks of not going to be appropriately treated.  No changes were made today due to non-adherence. -Continue lantus 28 units daily -Continue metformin 1000 mg BID  -Extensively discussed pathophysiology of diabetes, recommended lifestyle interventions, dietary effects on blood sugar control -Counseled on s/sx of and management of hypoglycemia  Written patient instructions provided. Total time in face to face counseling 30 minutes.  Follow up Pharmacist Clinic Visit in 2 weeks.    Harriet Pho, PharmD PGY-1 [facility]  Pharmacy Resident   04/27/2021 3:58 PM

## 2021-04-30 ENCOUNTER — Encounter (HOSPITAL_COMMUNITY): Payer: Self-pay

## 2021-04-30 ENCOUNTER — Other Ambulatory Visit: Payer: Self-pay

## 2021-04-30 ENCOUNTER — Ambulatory Visit (INDEPENDENT_AMBULATORY_CARE_PROVIDER_SITE_OTHER): Payer: BLUE CROSS/BLUE SHIELD | Admitting: Licensed Clinical Social Worker

## 2021-04-30 DIAGNOSIS — F1020 Alcohol dependence, uncomplicated: Secondary | ICD-10-CM | POA: Diagnosis not present

## 2021-04-30 DIAGNOSIS — G4700 Insomnia, unspecified: Secondary | ICD-10-CM

## 2021-04-30 DIAGNOSIS — F4312 Post-traumatic stress disorder, chronic: Secondary | ICD-10-CM

## 2021-04-30 DIAGNOSIS — F19980 Other psychoactive substance use, unspecified with psychoactive substance-induced anxiety disorder: Secondary | ICD-10-CM

## 2021-04-30 DIAGNOSIS — F322 Major depressive disorder, single episode, severe without psychotic features: Secondary | ICD-10-CM

## 2021-04-30 DIAGNOSIS — Z794 Long term (current) use of insulin: Secondary | ICD-10-CM

## 2021-04-30 DIAGNOSIS — F411 Generalized anxiety disorder: Secondary | ICD-10-CM

## 2021-04-30 DIAGNOSIS — F515 Nightmare disorder: Secondary | ICD-10-CM

## 2021-04-30 DIAGNOSIS — F341 Dysthymic disorder: Secondary | ICD-10-CM

## 2021-04-30 DIAGNOSIS — F1994 Other psychoactive substance use, unspecified with psychoactive substance-induced mood disorder: Secondary | ICD-10-CM

## 2021-04-30 DIAGNOSIS — Z811 Family history of alcohol abuse and dependence: Secondary | ICD-10-CM

## 2021-04-30 NOTE — Progress Notes (Signed)
Pt came by to report his apprecition for meds . He has been able to resume his diabetes treatment and has seen PCP.He says they wanted him to go to ER because sugar ws 428 but he declined.

## 2021-04-30 NOTE — Progress Notes (Signed)
Patient ID: Samuel [name], male   DOB: 1966-10-18, 55 y.o.   MRN: [id number]  S Pt came by to report his apprecition for meds . He has been able to resume his diabetes treatment and has seen PCP.He says they wanted him to go to ER because sugar ws 428 but he declined he has found a comfortable psychological space in the frame work og 1 day at time so that the pst and future are not creating anxiety.Says he drank 1 ber yesterday.  O- Alert NAD Oriented fully Comprehensible  A- Beginning to attempt recovery again  P-Continue SAIOP FU PRN

## 2021-05-02 NOTE — Progress Notes (Signed)
    Daily Group Progress Note  Program: CD-IOP   Group Time: 9am-10:30am   Participation Level: Active  Behavioral Response: Appropriate and Sharing  Type of Therapy: Process Group  Topic: Clinician checked in with group members, assessing for SI/HI/psychosis and overall level of functioning, including cravings or triggers and coping skills used to deal with uncomfortable feelings. Clinician inquired about attendance at community support meetings and connection with sober support system. Clinician and group members discussed what went well and challenges to sobriety. Clinician facilitated group processing of triggers and relapses over the weekend and common struggles in early sobriety, such as wanting the instant gratification, acceptance of physical/mental/emotional states sober in comparison to expectations and related disappointments or resentments    Group Time: 10:30am-12pm  Participation Level: Active  Behavioral Response: Appropriate and Sharing  Type of Therapy: Psycho-education Group  Topic: Clinician presented the psycho-educational topic of Adult Children of Alcoholics (ACOA). Clinician reviewed with clients the 'Laundry List' and common thought and behavior patterns of families with substance use in the home.    Summary: Client presented fully oriented, did not endorse SI/HI, did not appear intoxicated or psychotic at the time of session Client voiced being thankful for being linked with diabetes medication but frustrated with lack of access to services and medications. Client stated previous group missed due to lack of finances for transportation. Client reported 7/10 with 10 being worst for both anxiety and depression. Client processed relapse with group with a sobriety date of 04/29/21 related to coping with mental health symptoms. Client was receptive to psycho-education and the idea of "being comfortable with being uncomfortable."   Family Program: Family present? No    Name of family member(s): NA  UDS collected: No Results: NA  AA/NA attended?: No  Sponsor?: No   Olegario Messier, LCSW

## 2021-05-03 ENCOUNTER — Ambulatory Visit (INDEPENDENT_AMBULATORY_CARE_PROVIDER_SITE_OTHER): Payer: BLUE CROSS/BLUE SHIELD | Admitting: Licensed Clinical Social Worker

## 2021-05-03 ENCOUNTER — Other Ambulatory Visit: Payer: Self-pay

## 2021-05-03 DIAGNOSIS — F1020 Alcohol dependence, uncomplicated: Secondary | ICD-10-CM

## 2021-05-03 DIAGNOSIS — F411 Generalized anxiety disorder: Secondary | ICD-10-CM

## 2021-05-03 DIAGNOSIS — F322 Major depressive disorder, single episode, severe without psychotic features: Secondary | ICD-10-CM

## 2021-05-04 ENCOUNTER — Ambulatory Visit (HOSPITAL_COMMUNITY): Payer: Self-pay

## 2021-05-05 ENCOUNTER — Ambulatory Visit (HOSPITAL_COMMUNITY): Payer: Self-pay

## 2021-05-06 ENCOUNTER — Ambulatory Visit (HOSPITAL_COMMUNITY): Payer: Self-pay

## 2021-05-07 ENCOUNTER — Ambulatory Visit (INDEPENDENT_AMBULATORY_CARE_PROVIDER_SITE_OTHER): Payer: BLUE CROSS/BLUE SHIELD | Admitting: Licensed Clinical Social Worker

## 2021-05-07 ENCOUNTER — Other Ambulatory Visit: Payer: Self-pay

## 2021-05-07 DIAGNOSIS — F1020 Alcohol dependence, uncomplicated: Secondary | ICD-10-CM | POA: Diagnosis not present

## 2021-05-07 DIAGNOSIS — F322 Major depressive disorder, single episode, severe without psychotic features: Secondary | ICD-10-CM

## 2021-05-07 DIAGNOSIS — F411 Generalized anxiety disorder: Secondary | ICD-10-CM

## 2021-05-10 ENCOUNTER — Ambulatory Visit (HOSPITAL_COMMUNITY): Payer: Self-pay | Admitting: Behavioral Health

## 2021-05-10 NOTE — Progress Notes (Signed)
    Daily Group Progress Note  Program: CD-IOP   Group Time: 9am-10:30am  Participation Level: Active  Behavioral Response: Appropriate  Type of Therapy: Process Group  Topic: Clinician checked in with group members, assessed for SI/HI/psychosis and overall level of functioning including continued cravings or use. Clinician facilitated processing changing automatic thinking from addicted brain to rational thinking. Clinician and group members discussed the effect of modeling behaviors from family of origin, including identification and coping with uncomfortable feelings on addiction. Clinician and group members processed the importance of pairing consequences with craving thoughts, including utilizing opposite action and the phrase "yet."    Group Time: 10:30am-12pm  Participation Level: Active  Behavioral Response: Appropriate  Type of Therapy: Psycho-education Group  Topic: Clinician provided mindfulness activity in session for client practice. Clinician presented the psycho-education topic of Guilt and Shame. Clinician presented Almond Lint video 'Listening to Shame.' Clinician and group members processed thoughts, feelings, behaviors, and previous events related to Guilt, Toxic Guilt, and Shame. Clinician provided active listening, summarizing statements and validated client feelings. Clinician presented the option to replace 'I'm sorry' when shaming with 'Thank you for.' to avoid inappropriate guilt with gratitude. Clinician and group members discussed regret in relation to behaviors in addiction and utilizing radical acceptance to acknowledge past behaviors and utilizing change in behaviors to address previous behavior patterns.   Summary: Client reports continuing to struggle with automatic unhealthy thoughts related to use. Client was receptive to group feedback and acknowledged identifying unhealthy thoughts was a positive step. Client identified multiple thoughts and some  other feelings related to shame and disappointment in personal behaviors.Client reported 7/10 for anxiety and depression with 10 being worst. Client reported trouble with drowsiness causing decreased motivation. Client notes despite recent use, he does maintain a desire to achieve and maintain sobriety from all substances.   Family Program: Family present? No   Name of family member(s): NA  UDS collected: No Results: NA  AA/NA attended?: No  Sponsor?: No   Olegario Messier, LCSW

## 2021-05-11 ENCOUNTER — Ambulatory Visit (HOSPITAL_COMMUNITY): Payer: Self-pay

## 2021-05-11 NOTE — Progress Notes (Signed)
    Daily Group Progress Note  Program: CD-IOP    Group Time: 9am-10:30am   Participation Level: Active   Behavioral Response: Appropriate   Type of Therapy: Process Group   Topic: Clinician checked in with group members, assessing for SI/HI/psychosis and overall level of functioning, including cravings or triggers and coping skills used to deal with uncomfortable feelings. Clinician inquired about attendance at community support meetings and connection with sober support system. Clinician and group members discussed what went well and challenges to sobriety. Clinician facilitated group processing of triggers and relapses over the weekend and common struggles in early sobriety.     Group Time: 10:30am-12pm   Participation Level: Active   Behavioral Response: Appropriate and Sharing   Type of Therapy: Psycho-education Group   Topic: Psycho-educational group facilitated by Pharmacist from [facility]. Pharmacist provided information on medications, side effects, and interactions. Clients were provided with time to as questions about medications. Pharmacist provided feedback on medications to help with cravings and encouraged the need for behavioral intervention in addition to medication changes. Group reviewed possible negative side effects of THC vs CBD product use and potential for abuse of substances other than drug of choice. Group discussed ways to advocate for self when meeting with doctors to avoid potentially addictive medications. Clinician inquired a self-care activity to support recovery to be completed prior to next session.   Summary: Client presented with reported sobriety date of 04/23/21 with 0 community support meetings attended. Client notes public transportation is useful but sometimes not possible. Client reported 3/10 for anxiety and depression with 10 being the worst. Client reports he is working on "staying in the middle" with his mindset though  struggling to manage intense uncomfortable emotions.   Family Program: Family present? No   Name of family member(s): NA  UDS collected: No Results: NA  AA/NA attended?: No  Sponsor?: NO   Olegario Messier, LCSW

## 2021-05-12 ENCOUNTER — Ambulatory Visit (HOSPITAL_COMMUNITY): Payer: Self-pay | Admitting: Behavioral Health

## 2021-05-18 ENCOUNTER — Encounter: Payer: Self-pay | Admitting: Family

## 2021-05-18 ENCOUNTER — Other Ambulatory Visit: Payer: Self-pay

## 2021-05-18 ENCOUNTER — Ambulatory Visit (INDEPENDENT_AMBULATORY_CARE_PROVIDER_SITE_OTHER): Payer: BLUE CROSS/BLUE SHIELD | Admitting: Family

## 2021-05-18 VITALS — BP 127/87 | HR 92 | Temp 98.0°F | Resp 16 | Ht 68.47 in | Wt 145.2 lb

## 2021-05-18 DIAGNOSIS — Z1211 Encounter for screening for malignant neoplasm of colon: Secondary | ICD-10-CM

## 2021-05-18 DIAGNOSIS — R1013 Epigastric pain: Secondary | ICD-10-CM

## 2021-05-18 DIAGNOSIS — R253 Fasciculation: Secondary | ICD-10-CM | POA: Diagnosis not present

## 2021-05-18 DIAGNOSIS — G629 Polyneuropathy, unspecified: Secondary | ICD-10-CM

## 2021-05-18 DIAGNOSIS — E1169 Type 2 diabetes mellitus with other specified complication: Secondary | ICD-10-CM

## 2021-05-18 DIAGNOSIS — Z0100 Encounter for examination of eyes and vision without abnormal findings: Secondary | ICD-10-CM

## 2021-05-18 DIAGNOSIS — Z13228 Encounter for screening for other metabolic disorders: Secondary | ICD-10-CM

## 2021-05-18 DIAGNOSIS — N529 Male erectile dysfunction, unspecified: Secondary | ICD-10-CM

## 2021-05-18 DIAGNOSIS — Z0000 Encounter for general adult medical examination without abnormal findings: Secondary | ICD-10-CM | POA: Diagnosis not present

## 2021-05-18 DIAGNOSIS — Z130 Encounter for screening for diseases of the blood and blood-forming organs and certain disorders involving the immune mechanism: Secondary | ICD-10-CM

## 2021-05-18 DIAGNOSIS — Z794 Long term (current) use of insulin: Secondary | ICD-10-CM

## 2021-05-18 DIAGNOSIS — E119 Type 2 diabetes mellitus without complications: Secondary | ICD-10-CM

## 2021-05-18 DIAGNOSIS — Z13220 Encounter for screening for lipoid disorders: Secondary | ICD-10-CM

## 2021-05-18 DIAGNOSIS — Z1329 Encounter for screening for other suspected endocrine disorder: Secondary | ICD-10-CM

## 2021-05-18 MED ORDER — SILDENAFIL CITRATE 25 MG PO TABS
ORAL_TABLET | ORAL | 1 refills | Status: DC
  Filled 2021-05-18: qty 10, 30d supply, fill #0

## 2021-05-18 NOTE — Progress Notes (Signed)
Pt presents for annual physical exam pt states that he has been experiencing aching stomach for about 1 month states does have acid reflux but doesn't feel like that -worried about stomach cancer Pt stated that right foot is going numb more that usual  Request refill on sildenafil

## 2021-05-18 NOTE — Progress Notes (Signed)
Patient ID: Samuel [name], male    DOB: 1966/04/04  MRN: [id number]  CC: Annual Exam   Subjective: Samuel [name] is a 55 y.o. male who presents for annual physical exam.   His concerns today include:  DIABETES FOLLOW-UP: Visit 04/27/2021 at [facility] [place] per PharmD note: Diabetes longstanding currently uncontrolled. Complicated by alcohol use disorder. Patient is able to verbalize appropriate hypoglycemia management plan. Medication adherence denied. States he has not been able to access medications due to cost and insurance issues. We are sending all scripts to [facility] today. I have confirmed all medications were picked up today after visit. Due to significantly high BG, we strongly recommended that patient leave clinic and go to ED. Patient refused, and verbalized understanding of risks of not going to be appropriately treated. No changes were made today due to non-adherence. -Continue lantus 28 units daily -Continue metformin 1000 mg BID -Extensively discussed pathophysiology of diabetes, recommended lifestyle interventions, dietary effects on blood sugar control -Counseled on s/sx of and management of hypoglycemia  05/18/2021: Doing well on current regimen. No side effects. No issues/concerns. Still having bilateral lower feet neuropathy. Only taking Gabapentin at bedtime because of the side effect of drowsiness. Reports he is currently not working so may try Gabapentin during the day to see if it helps with neuropathy.   2.  STOMACH PAIN: For at least 1 month located in the central area. Denies radiation. Reports difficult to describe. Initially began with diarrhea which soon resolved. Denies nausea and vomiting. Eating and drinking as normal. Family history of abdominal aneurysms and stomach cancer.   3. MUSCLE TWITCHING: Reports usually having twitching 3 days or less after discontinuing alcohol consumption. Reports it has been 5 days  without alcohol and still having muscle twitching. He is very concerned. Reports recently cutting an onion with a knife and unable to hold still. Would like further evaluation.    3. ERECTILE DYSFUNCTION: Requesting refills of Sildenafil.   Patient Active Problem List   Diagnosis Date Noted   Major depressive disorder, severe ([place]) 03/10/2021   Suicidal ideation    GAD (generalized anxiety disorder)    Hyperlipidemia 12/26/2020   Vitamin D deficiency 12/26/2020   Insomnia 12/26/2020   Neuropathy 12/26/2020   Gastroesophageal reflux disease without esophagitis 12/26/2020   Nausea and vomiting 03/20/2020   Pancreatitis 03/20/2020   Other male erectile dysfunction 11/20/2019   Microalbuminuria due to type 2 diabetes mellitus ([place]) 09/10/2019   Tobacco dependence 08/13/2019   DM type 2 with diabetic peripheral neuropathy ([place]) 08/13/2019   Clavicle fracture 02/28/2017   Alcohol withdrawal ([place]) 12/26/2016   Alcohol use disorder, severe, dependence ([place]) 12/26/2016   Major depressive disorder 12/26/2016   Diabetes ([place]) 12/26/2016   Tobacco use disorder 12/26/2016     Current Outpatient Medications on File Prior to Visit  Medication Sig Dispense Refill   aspirin 81 MG EC tablet Take 1 tablet (81 mg total) by mouth daily. Swallow whole. 30 tablet 11   atorvastatin (LIPITOR) 40 MG tablet Take 1 tablet (40 mg total) by mouth daily. 30 tablet 0   baclofen (LIORESAL) 10 MG tablet Take 1 tablet (10 mg total) by mouth 3 (three) times daily. 90 tablet 1   escitalopram (LEXAPRO) 20 MG tablet Take 1 tablet (20 mg total) by mouth daily. 30 tablet 0   gabapentin (NEURONTIN) 300 MG capsule Take 1 capsule (300 mg total) by mouth 3 (three) times daily. 90 capsule  0   hydrOXYzine (ATARAX/VISTARIL) 25 MG tablet TAKE 1 TABLET (25 MG TOTAL) BY MOUTH EVERY 6 (SIX) HOURS AS NEEDED. 30 tablet 0   insulin glargine (LANTUS) 100 UNIT/ML Solostar Pen INJECT 28 UNITS INTO THE SKIN DAILY. 15 mL 1   Insulin Pen  Needle 32G X 4 MM MISC 1 EACH BY DOES NOT APPLY ROUTE IN THE MORNING AND AT BEDTIME. 100 each 11   metFORMIN (GLUCOPHAGE) 1000 MG tablet Take 1 tablet (1,000 mg total) by mouth 2 (two) times daily. 60 tablet 0   Multiple Vitamin (MULTIVITAMIN WITH MINERALS) TABS tablet Take 1 tablet by mouth daily.     pantoprazole (PROTONIX) 40 MG tablet Take 1 tablet (40 mg total) by mouth daily. 30 tablet 0   traZODone (DESYREL) 50 MG tablet Take 1 tablet (50 mg total) by mouth at bedtime as needed for sleep. 30 tablet 0   TRUEplus Lancets 28G MISC USE AS INSTRUCTED 100 each 12   No current facility-administered medications on file prior to visit.    Allergies  Allergen Reactions   Penicillins Hives    Social History   Socioeconomic History   Marital status: Single    Spouse name: Not on file   Number of children: Not on file   Years of education: Not on file   Highest education level: Not on file  Occupational History   Not on file  Tobacco Use   Smoking status: Every Day    Packs/day: 1.00    Pack years: 0.00    Types: Cigarettes   Smokeless tobacco: Never  Substance and Sexual Activity   Alcohol use: Yes    Alcohol/week: 50.0 standard drinks    Types: 50 Cans of beer per week    Comment: last thursday - a couple of beers   Drug use: No   Sexual activity: Yes  Other Topics Concern   Not on file  Social History Narrative   Not on file   Social Determinants of Health   Financial Resource Strain: Not on file  Food Insecurity: Not on file  Transportation Needs: Not on file  Physical Activity: Not on file  Stress: Not on file  Social Connections: Not on file  Intimate Partner Violence: Not on file    History reviewed. No pertinent family history.  History reviewed. No pertinent surgical history.  ROS: Review of Systems Negative except as stated above  PHYSICAL EXAM: BP 127/87 (BP Location: Left Arm, Patient Position: Sitting, Cuff Size: Normal)   Pulse 92   Temp 98 F  (36.7 C)   Resp 16   Ht 5' 8.47" (1.739 m)   Wt 145 lb 3.2 oz (65.9 kg)   SpO2 97%   BMI 21.78 kg/m   Physical Exam Constitutional:      Appearance: He is normal weight.  HENT:     Head: Normocephalic and atraumatic.     Right Ear: Tympanic membrane, ear canal and external ear normal.     Left Ear: Tympanic membrane, ear canal and external ear normal.  Eyes:     Extraocular Movements: Extraocular movements intact.     Conjunctiva/sclera: Conjunctivae normal.     Pupils: Pupils are equal, round, and reactive to light.  Cardiovascular:     Rate and Rhythm: Normal rate and regular rhythm.     Pulses: Normal pulses.     Heart sounds: Normal heart sounds.  Pulmonary:     Effort: Pulmonary effort is normal.     Breath sounds:  Normal breath sounds.  Abdominal:     General: Abdomen is flat. Bowel sounds are normal.     Palpations: Abdomen is soft.  Genitourinary:    Comments: Patient declined examination.  Musculoskeletal:        General: Normal range of motion.     Cervical back: Normal range of motion and neck supple.  Skin:    General: Skin is warm and dry.     Capillary Refill: Capillary refill takes less than 2 seconds.  Neurological:     General: No focal deficit present.     Mental Status: He is alert and oriented to person, place, and time.  Psychiatric:        Mood and Affect: Mood normal.        Behavior: Behavior normal.   Physical Exam Constitutional:      Appearance: He is normal weight.  HENT:     Head: Normocephalic and atraumatic.     Right Ear: Tympanic membrane, ear canal and external ear normal.     Left Ear: Tympanic membrane, ear canal and external ear normal.  Eyes:     Extraocular Movements: Extraocular movements intact.     Conjunctiva/sclera: Conjunctivae normal.     Pupils: Pupils are equal, round, and reactive to light.  Cardiovascular:     Rate and Rhythm: Normal rate and regular rhythm.     Pulses: Normal pulses.     Heart sounds: Normal  heart sounds.  Pulmonary:     Effort: Pulmonary effort is normal.     Breath sounds: Normal breath sounds.  Abdominal:     General: Abdomen is flat. Bowel sounds are normal.     Palpations: Abdomen is soft.  Genitourinary:    Comments: Patient declined examination.  Musculoskeletal:        General: Normal range of motion.     Cervical back: Normal range of motion and neck supple.  Skin:    General: Skin is warm and dry.     Capillary Refill: Capillary refill takes less than 2 seconds.  Neurological:     General: No focal deficit present.     Mental Status: He is alert and oriented to person, place, and time.  Psychiatric:        Mood and Affect: Mood normal.        Behavior: Behavior normal.   Diabetic foot exam was performed with the following findings:   No deformities, ulcerations, or other skin breakdown Intact posterior tibialis and dorsalis pedis pulses Decrease sensation to touch and monofilament testing bilaterally.      ASSESSMENT AND PLAN: 1. Annual physical exam: - Counseled on 150 minutes of exercise per week as tolerated, healthy eating (including decreased daily intake of saturated fats, cholesterol, added sugars, sodium), STI prevention, and routine healthcare maintenance.  2. Screening for metabolic disorder: - CMP last obtained 03/09/2021 and essentially normal at that time.   3. Screening for deficiency anemia: - CBC last obtained 03/09/2021 and normal at that time.   4. Screening cholesterol level: - Lipid panel last obtained 03/11/2021 and normal at that time.   5. Thyroid disorder screen: - TSH last obtained 03/11/2021 and normal at that time.   6. Epigastric pain: - Ultrasound abdomen for further evaluation.  - US Abdomen Complete; Future  7. Colon cancer screening: - Referral to Gastroenterology for colon cancer screening by colonoscopy. - Ambulatory referral to Gastroenterology  8. Muscle twitching: - Per patient request referral to Neurology  for further evaluation  and management.  9. Erectile dysfunction, unspecified erectile dysfunction type: - Continue Sildenafil as prescribed.  - Follow-up with primary provider as scheduled.  - sildenafil (VIAGRA) 25 MG tablet; Take 1 tablet (25 mg total) 1/2 hour to 1 hour prior to intercourse as needed. Limit use to 1/2 tablet or 1 tablet per 24 hours.  Dispense: 30 tablet; Refill: 1  10. Type 2 diabetes mellitus with other specified complication, with long-term current use of insulin ([place]): 11. Neuropathy: - Hemoglobin last obtained 03/09/2021 and not at goal at 9.3%, goal < 7%. However, this was improved from previous hemoglobin A1c of 13.1% on 12/24/2020. Next hemoglobin A1c due July 2022.  - Continue Metformin and Lantus as prescribed.  - Keep appointment with Benard Halsted, PharmD for diabetes checkup in July 2022.   12. Diabetic eye exam ([place]): - Referral to Ophthalmology for diabetic eye exam.  - Ambulatory referral to Ophthalmology     Patient was given the opportunity to ask questions.  Patient verbalized understanding of the plan and was able to repeat key elements of the plan. Patient was given clear instructions to go to Emergency Department or return to medical center if symptoms don't improve, worsen, or new problems develop.The patient verbalized understanding.   Orders Placed This Encounter  Procedures   US Abdomen Complete   Ambulatory referral to Ophthalmology   Ambulatory referral to Gastroenterology     Requested Prescriptions   Signed Prescriptions Disp Refills   sildenafil (VIAGRA) 25 MG tablet 30 tablet 1    Sig: Take 1 tablet (25 mg total) 1/2 hour to 1 hour prior to intercourse as needed. Limit use to 1/2 tablet or 1 tablet per 24 hours.    Return in about 4 weeks (around 06/15/2021) for [facility] at [facility] for diabetes.  Camillia Herter, NP

## 2021-05-21 ENCOUNTER — Other Ambulatory Visit: Payer: Self-pay

## 2021-05-26 ENCOUNTER — Ambulatory Visit: Payer: BLUE CROSS/BLUE SHIELD | Admitting: Pharmacist

## 2021-05-26 ENCOUNTER — Ambulatory Visit: Payer: BLUE CROSS/BLUE SHIELD

## 2021-05-28 ENCOUNTER — Telehealth: Payer: Self-pay | Admitting: Family

## 2021-05-28 NOTE — Telephone Encounter (Signed)
I called the Pt, Samuel [name] to inform him that will be cancel the financial appt since Pt has BC/BS insurance active

## 2021-05-31 ENCOUNTER — Ambulatory Visit: Payer: BLUE CROSS/BLUE SHIELD

## 2021-06-01 ENCOUNTER — Ambulatory Visit: Payer: BLUE CROSS/BLUE SHIELD | Admitting: Pharmacist

## 2021-06-14 NOTE — Progress Notes (Signed)
S:    PCP: Durene Fruits  Patient arrives in good spirits. Presents for diabetes evaluation, education, and management. Patient was referred by Primary Care Provider on 01/20/2021. Last seen by CPP 04/27/21 at which time he reported not having any medications for the prior 2 months due to cost/insurance issues. BG was 591 and hypotensive. Recommended go to ED but patient refused. Lantus and metformin were resumed and confirmed to be picked up by [facility] pharmacy. Last seen by PCP 05/18/21, he reported to be doing well on current regimen and was referred to ophthalmology for diabetic eye exam.   Today, patient reports he has started taking his medications 4-5 times per week. When asked, he does not take it the other days because he does not like being on medications. He does report feeling much better, his appetite has been back, and states he has gained weight, which he is encouraged by. He has gained 17 lbs since his visit 1 month ago. He knows this is due to taking his medications and has motivated him to want to take his medications more. However, he has not been checking his BG at home due to issues with his glucometer. Denies symptoms of hypoglycemia including dizziness, lightheadedness, shakiness, sweating. Reports he has a court date tomorrow so he has been stressed. Drank 3 "fruity beers" today, with the last one around 1pm (~1-2 hours ago). Last ate around 4am when he had a meatloaf and mashed potato instant meal. Took 28 units of insulin last night and metformin this morning. He has been scheduled for an eye doctor appointment and with the neurologist but reports he has not yet heard about his colonoscopy and ultrasound referral.   Patient reports Diabetes was diagnosed in ~2011.   Family/Social History:  - FHx: no pertinent positives  - Tobacco: current 1 PPD - Alcohol: suffers from alcohol use disorder  Insurance coverage/medication affordability: Medicaid   Medication adherence denied. See  above.  Current diabetes medications include: lantus 28 units daily, metformin 1000 mg BID  Patient denies hypoglycemic events.   Patient reports nocturia (nighttime urination). States this is improving.  Patient reports neuropathy (nerve pain). Patient reports visual changes. Patient denies self foot exams.     O:  POCT: 322  Lab Results  Component Value Date   HGBA1C 9.3 (H) 03/09/2021   There were no vitals filed for this visit.  Lipid Panel     Component Value Date/Time   CHOL 139 03/11/2021 1843   CHOL 156 12/24/2020 1144   TRIG 117 03/11/2021 1843   HDL 58 03/11/2021 1843   HDL 55 12/24/2020 1144   CHOLHDL 2.4 03/11/2021 1843   VLDL 23 03/11/2021 1843   LDLCALC 58 03/11/2021 1843   LDLCALC 89 12/24/2020 1144   Home fasting blood sugars: not checking  2 hour post-meal/random blood sugars: not checking.  Clinical Atherosclerotic Cardiovascular Disease (ASCVD): No  The 10-year ASCVD risk score Mikey Bussing DC Jr., et al., 2013) is: 19%   Values used to calculate the score:     Age: 21 years     Sex: Male     Is Non-Hispanic African American: Yes     Diabetic: Yes     Tobacco smoker: Yes     Systolic Blood Pressure: 831 mmHg     Is BP treated: No     HDL Cholesterol: 58 mg/dL     Total Cholesterol: 139 mg/dL    A/P: Diabetes longstanding currently uncontrolled. Complicated by alcohol use  disorder. Patient is able to verbalize appropriate hypoglycemia management plan. Medication adherence has improved with reports of taking his medications 4-5 times per week and is taking the correct doses, though he is not checking BG. In clinic today BG is 322, which while not at goal, is much improved from his last visit. Encouraged patient regarding weight gain and symptom improvement and linked this to his improved adherence to medications. Encouraged him to start taking his medications 6-7 days per week until his next visit and start checking his BG at home to see how that improves  his symptoms and BG control. Will also increase dose of Lantus because even with daily adherence, do not expect current dose to bring BG to goal given BG today still significantly above goal.  -Increase Lantus to 32 units daily -Continue metformin 1000 mg BID  -Checking A1c today -Extensively discussed pathophysiology of diabetes, recommended lifestyle interventions, dietary effects on blood sugar control -Counseled on s/sx of and management of hypoglycemia  Written patient instructions provided. Total time in face to face counseling 30 minutes.  Follow up Pharmacist Clinic Visit in 4 weeks.    Rebbeca Paul, PharmD PGY2 Ambulatory Care Pharmacy Resident 06/16/2021 3:51 PM

## 2021-06-16 ENCOUNTER — Other Ambulatory Visit: Payer: Self-pay

## 2021-06-16 ENCOUNTER — Ambulatory Visit: Payer: BLUE CROSS/BLUE SHIELD | Attending: Family | Admitting: Pharmacist

## 2021-06-16 ENCOUNTER — Ambulatory Visit: Payer: BLUE CROSS/BLUE SHIELD

## 2021-06-16 ENCOUNTER — Encounter: Payer: Self-pay | Admitting: Pharmacist

## 2021-06-16 VITALS — Wt 162.2 lb

## 2021-06-16 DIAGNOSIS — E785 Hyperlipidemia, unspecified: Secondary | ICD-10-CM

## 2021-06-16 DIAGNOSIS — G4700 Insomnia, unspecified: Secondary | ICD-10-CM

## 2021-06-16 DIAGNOSIS — F411 Generalized anxiety disorder: Secondary | ICD-10-CM

## 2021-06-16 DIAGNOSIS — Z794 Long term (current) use of insulin: Secondary | ICD-10-CM

## 2021-06-16 DIAGNOSIS — F322 Major depressive disorder, single episode, severe without psychotic features: Secondary | ICD-10-CM

## 2021-06-16 DIAGNOSIS — K219 Gastro-esophageal reflux disease without esophagitis: Secondary | ICD-10-CM

## 2021-06-16 DIAGNOSIS — E1169 Type 2 diabetes mellitus with other specified complication: Secondary | ICD-10-CM

## 2021-06-16 LAB — GLUCOSE, POCT (MANUAL RESULT ENTRY): POC Glucose: 322 mg/dl — AB (ref 70–99)

## 2021-06-16 MED ORDER — GABAPENTIN 300 MG PO CAPS
300.0000 mg | ORAL_CAPSULE | Freq: Three times a day (TID) | ORAL | 2 refills | Status: DC
  Filled 2021-06-16: qty 90, 30d supply, fill #0

## 2021-06-16 MED ORDER — ATORVASTATIN CALCIUM 40 MG PO TABS
40.0000 mg | ORAL_TABLET | Freq: Every day | ORAL | 2 refills | Status: DC
  Filled 2021-06-16: qty 30, 30d supply, fill #0

## 2021-06-16 MED ORDER — METFORMIN HCL 1000 MG PO TABS
1000.0000 mg | ORAL_TABLET | Freq: Two times a day (BID) | ORAL | 2 refills | Status: DC
  Filled 2021-06-16: qty 60, 30d supply, fill #0

## 2021-06-16 MED ORDER — TRUE METRIX BLOOD GLUCOSE TEST VI STRP
ORAL_STRIP | 2 refills | Status: DC
  Filled 2021-06-16: qty 100, 33d supply, fill #0

## 2021-06-16 MED ORDER — INSULIN GLARGINE 100 UNIT/ML SOLOSTAR PEN
32.0000 [IU] | PEN_INJECTOR | Freq: Every day | SUBCUTANEOUS | 1 refills | Status: DC
  Filled 2021-06-16: qty 9, 28d supply, fill #0

## 2021-06-16 MED ORDER — PANTOPRAZOLE SODIUM 40 MG PO TBEC
40.0000 mg | DELAYED_RELEASE_TABLET | Freq: Every day | ORAL | 2 refills | Status: DC
  Filled 2021-06-16: qty 30, 30d supply, fill #0

## 2021-06-16 MED ORDER — TRAZODONE HCL 50 MG PO TABS
50.0000 mg | ORAL_TABLET | Freq: Every evening | ORAL | 2 refills | Status: DC | PRN
  Filled 2021-06-16: qty 30, 30d supply, fill #0

## 2021-06-16 MED ORDER — TRUE METRIX METER W/DEVICE KIT
PACK | 0 refills | Status: DC
  Filled 2021-06-16: qty 1, 365d supply, fill #0

## 2021-06-16 MED ORDER — TRUEPLUS LANCETS 28G MISC
2 refills | Status: DC
  Filled 2021-06-16: qty 100, 33d supply, fill #0

## 2021-06-16 MED ORDER — ESCITALOPRAM OXALATE 20 MG PO TABS
20.0000 mg | ORAL_TABLET | Freq: Every day | ORAL | 2 refills | Status: DC
  Filled 2021-06-16: qty 30, 30d supply, fill #0

## 2021-06-17 LAB — HEMOGLOBIN A1C
Est. average glucose Bld gHb Est-mCnc: 258 mg/dL
Hgb A1c MFr Bld: 10.6 % — ABNORMAL HIGH (ref 4.8–5.6)

## 2021-06-23 ENCOUNTER — Other Ambulatory Visit: Payer: Self-pay

## 2021-07-06 NOTE — Progress Notes (Unsigned)
S:    PCP: Durene Fruits  Patient arrives in good spirits. Presents for diabetes evaluation, education, and management. Patient was referred by Primary Care Provider on 01/20/2021. Seen by CPP 04/27/21 at which time he reported not having any medications for the prior 2 months due to cost/insurance issues. BG was 591 and hypotensive. Recommended go to ED but patient refused. Lantus and metformin were resumed and confirmed to be picked up by [facility] pharmacy. Last seen by PCP 05/18/21, he reported to be doing well on current regimen and was referred to ophthalmology for diabetic eye exam. At last CPP visit on 7/27, patient only taking medications 4-5 days per week. BG 322, increased Lantus from 28 to 32 units daily. A1c at that time was 10.6.   Today, *** BG at home?  Adherence?  Titrate Lantus  Patient reports Diabetes was diagnosed in ~2011.   Family/Social History:  - FHx: no pertinent positives  - Tobacco: current 1 PPD - Alcohol: suffers from alcohol use disorder  Insurance coverage/medication affordability: Medicaid   Medication adherence denied. See above.  Current diabetes medications include: lantus 32 units daily, metformin 1000 mg BID  Patient denies hypoglycemic events.   Patient reports nocturia (nighttime urination). States this is improving.  Patient reports neuropathy (nerve pain). Patient reports visual changes. Patient denies self foot exams.     O:  POCT: ***  Lab Results  Component Value Date   HGBA1C 10.6 (H) 06/16/2021   There were no vitals filed for this visit.  Lipid Panel     Component Value Date/Time   CHOL 139 03/11/2021 1843   CHOL 156 12/24/2020 1144   TRIG 117 03/11/2021 1843   HDL 58 03/11/2021 1843   HDL 55 12/24/2020 1144   CHOLHDL 2.4 03/11/2021 1843   VLDL 23 03/11/2021 1843   LDLCALC 58 03/11/2021 1843   LDLCALC 89 12/24/2020 1144   Home fasting blood sugars: not checking  2 hour post-meal/random blood sugars: not  checking.  Clinical Atherosclerotic Cardiovascular Disease (ASCVD): No  The 10-year ASCVD risk score Mikey Bussing DC Jr., et al., 2013) is: 19%   Values used to calculate the score:     Age: 55 years     Sex: Male     Is Non-Hispanic African American: Yes     Diabetic: Yes     Tobacco smoker: Yes     Systolic Blood Pressure: 202 mmHg     Is BP treated: No     HDL Cholesterol: 58 mg/dL     Total Cholesterol: 139 mg/dL    A/P: Diabetes longstanding currently uncontrolled. Complicated by alcohol use disorder. Patient is able to verbalize appropriate hypoglycemia management plan. Medication adherence has improved with reports of taking his medications 4-5 times per week and is taking the correct doses, though he is not checking BG. In clinic today BG is 322, which while not at goal, is much improved from his last visit. Encouraged patient regarding weight gain and symptom improvement and linked this to his improved adherence to medications. Encouraged him to start taking his medications 6-7 days per week until his next visit and start checking his BG at home to see how that improves his symptoms and BG control. Will also increase dose of Lantus because even with daily adherence, do not expect current dose to bring BG to goal given BG today still significantly above goal.  -Increase Lantus to 32 units daily -Continue metformin 1000 mg BID  -Checking A1c today -Extensively  discussed pathophysiology of diabetes, recommended lifestyle interventions, dietary effects on blood sugar control -Counseled on s/sx of and management of hypoglycemia  Written patient instructions provided. Total time in face to face counseling 30 minutes.  Follow up Pharmacist Clinic Visit in 4 weeks.    Rebbeca Paul, PharmD PGY2 Ambulatory Care Pharmacy Resident 07/06/2021 6:45 PM

## 2021-07-07 ENCOUNTER — Ambulatory Visit: Payer: BLUE CROSS/BLUE SHIELD | Admitting: Pharmacist

## 2021-08-05 ENCOUNTER — Telehealth: Payer: Self-pay | Admitting: Neurology

## 2021-08-05 NOTE — Telephone Encounter (Signed)
Dr. Krista Blue will be out of the office on 10/10. I called the patient to reschedule and it went right to vm but I wasn't able to leave a voice mail. I reschedule him for 10/14 at 10am. I sent a mychart message letting him know.

## 2021-08-13 ENCOUNTER — Other Ambulatory Visit: Payer: Self-pay

## 2021-08-30 ENCOUNTER — Ambulatory Visit: Payer: BLUE CROSS/BLUE SHIELD | Admitting: Neurology

## 2021-09-03 ENCOUNTER — Encounter: Payer: Self-pay | Admitting: Neurology

## 2021-09-03 ENCOUNTER — Ambulatory Visit: Payer: BLUE CROSS/BLUE SHIELD | Admitting: Neurology

## 2021-12-02 ENCOUNTER — Telehealth: Payer: Self-pay | Admitting: Family

## 2021-12-02 NOTE — Telephone Encounter (Signed)
Copied from [place] [id number]. Topic: General - Other >> Jun 15, 2021  3:25 PM Pawlus, Brayton Layman A wrote: Reason for CRM: Pt requested a call back from [place] regarding paperwork, pt went to the [place] office and needs further information.  I return Pt call, unable to LVM is not set up to inform him that he has [employer], that I verified today and is active for this reason he can not apply for the financial programs

## 2022-06-13 DIAGNOSIS — M899 Disorder of bone, unspecified: Secondary | ICD-10-CM | POA: Insufficient documentation

## 2022-06-13 DIAGNOSIS — R634 Abnormal weight loss: Secondary | ICD-10-CM | POA: Insufficient documentation

## 2022-06-13 DIAGNOSIS — E1165 Type 2 diabetes mellitus with hyperglycemia: Secondary | ICD-10-CM

## 2022-06-13 HISTORY — DX: Type 2 diabetes mellitus with hyperglycemia: E11.65

## 2022-07-17 ENCOUNTER — Encounter (HOSPITAL_BASED_OUTPATIENT_CLINIC_OR_DEPARTMENT_OTHER): Payer: Self-pay | Admitting: Emergency Medicine

## 2022-07-17 ENCOUNTER — Other Ambulatory Visit: Payer: Self-pay

## 2022-07-17 ENCOUNTER — Emergency Department (HOSPITAL_BASED_OUTPATIENT_CLINIC_OR_DEPARTMENT_OTHER): Payer: BLUE CROSS/BLUE SHIELD

## 2022-07-17 ENCOUNTER — Emergency Department (HOSPITAL_BASED_OUTPATIENT_CLINIC_OR_DEPARTMENT_OTHER)
Admission: EM | Admit: 2022-07-17 | Discharge: 2022-07-17 | Disposition: A | Discharge status: Home / Self Care | Payer: BLUE CROSS/BLUE SHIELD | Attending: Emergency Medicine | Admitting: Emergency Medicine

## 2022-07-17 DIAGNOSIS — R531 Weakness: Secondary | ICD-10-CM | POA: Diagnosis not present

## 2022-07-17 DIAGNOSIS — Z794 Long term (current) use of insulin: Secondary | ICD-10-CM | POA: Insufficient documentation

## 2022-07-17 DIAGNOSIS — S32020G Wedge compression fracture of second lumbar vertebra, subsequent encounter for fracture with delayed healing: Secondary | ICD-10-CM

## 2022-07-17 DIAGNOSIS — M545 Low back pain, unspecified: Secondary | ICD-10-CM | POA: Insufficient documentation

## 2022-07-17 DIAGNOSIS — E119 Type 2 diabetes mellitus without complications: Secondary | ICD-10-CM | POA: Insufficient documentation

## 2022-07-17 DIAGNOSIS — M549 Dorsalgia, unspecified: Secondary | ICD-10-CM | POA: Diagnosis present

## 2022-07-17 DIAGNOSIS — Z7984 Long term (current) use of oral hypoglycemic drugs: Secondary | ICD-10-CM | POA: Diagnosis not present

## 2022-07-17 DIAGNOSIS — Z7982 Long term (current) use of aspirin: Secondary | ICD-10-CM | POA: Diagnosis not present

## 2022-07-17 DIAGNOSIS — C7951 Secondary malignant neoplasm of bone: Secondary | ICD-10-CM

## 2022-07-17 MED ORDER — CYCLOBENZAPRINE HCL 10 MG PO TABS: 10.0000 mg | ORAL_TABLET | Freq: Two times a day (BID) | ORAL | 0 refills | Status: DC | PRN

## 2022-07-17 MED ORDER — OXYCODONE HCL 5 MG PO TABS
5.0000 mg | ORAL_TABLET | Freq: Once | ORAL | Status: AC
  Administered 2022-07-17: 5 mg via ORAL
  Filled 2022-07-17: qty 1

## 2022-07-17 MED ORDER — LIDOCAINE 5 % EX PTCH
1.0000 | MEDICATED_PATCH | Freq: Once | CUTANEOUS | Status: DC
  Administered 2022-07-17: 1 via TRANSDERMAL
  Filled 2022-07-17: qty 1

## 2022-07-17 MED ORDER — KETOROLAC TROMETHAMINE 30 MG/ML IJ SOLN
30.0000 mg | Freq: Once | INTRAMUSCULAR | Status: AC
  Administered 2022-07-17: 30 mg via INTRAMUSCULAR
  Filled 2022-07-17: qty 1

## 2022-07-17 MED ORDER — METHYLPREDNISOLONE 4 MG PO TBPK: ORAL_TABLET | ORAL | 0 refills | Status: DC

## 2022-07-17 MED ORDER — ACETAMINOPHEN 500 MG PO TABS
1000.0000 mg | ORAL_TABLET | Freq: Once | ORAL | Status: AC
  Administered 2022-07-17: 1000 mg via ORAL
  Filled 2022-07-17: qty 2

## 2022-07-17 MED ORDER — OXYCODONE HCL 5 MG PO TABS: 5.0000 mg | ORAL_TABLET | Freq: Four times a day (QID) | ORAL | 0 refills | Status: DC | PRN

## 2022-07-17 MED ORDER — CYCLOBENZAPRINE HCL 10 MG PO TABS
10.0000 mg | ORAL_TABLET | Freq: Once | ORAL | Status: AC
  Administered 2022-07-17: 10 mg via ORAL
  Filled 2022-07-17: qty 1

## 2022-07-17 NOTE — ED Notes (Signed)
Bladder scan results : 3ml

## 2022-07-17 NOTE — Progress Notes (Signed)
Orthopedic Tech Progress Note Patient Details:  Samuel [name] Jan 14, 1966 [id number]  Patient ID: Samuel [name], male   DOB: 05-15-1966, 56 y.o.   MRN: [id number] Order placed with [facility] for TLSO. Vernona Rieger 07/17/2022, 4:56 PM

## 2022-07-17 NOTE — ED Provider Notes (Signed)
56 yo male here with back pain, midline back ttp Pt had outpatient MRI done recently per his report at [facility] but it is not visible on our medical records  Pending CT L spine  Physical Exam  BP 112/70 (BP Location: Right Arm)   Pulse 89   Temp 98.6 F (37 C) (Oral)   Resp 16   SpO2 99%   Physical Exam  Procedures  Procedures  ED Course / MDM   Clinical Course as of 07/18/22 1049  Sun Jul 17, 2022  1545 Patient signed out to Dr. Ranelle Oyster pending CT and reassessment [VK]  1714 Dr Ladona Horns from neurosurgery recommending to the patient is able to walk and seems to have no neurological deficits he can be discharged with TLSO back brace, pain control, needs primarily follow-up with an oncologist to establish with primary source of malignancy, and treatment may largely include radiation or other forms of treatment.  However the patient can also follow-up in the neurosurgery clinic for the compression fracture, potential biopsy as well. [MT]  4401 The patient and his sister at the bedside and sister by phone were all updated regarding the compression fractures and likely diagnosis.  This appears to be malignancy but is not clear what the primary is.  A TLSO brace is ordered.  His pain is significantly improved with medications given here and we can represcribe these for home until he has the appropriate follow-up.  He would prefer a more local oncologist and we can give him the information for 1.  I did advise that he follow-up with his PCP regarding the outpatient work-up that is currently pending, as he reports he had a CT scan of the chest and an MRI performed recently but we are not able to review those results. [MT]    Clinical Course User Index [MT] Uchechukwu Dhawan, Carola Rhine, MD [VK] Ottie Glazier, DO   Medical Decision Making Amount and/or Complexity of Data Reviewed Radiology: ordered.  Risk OTC drugs. Prescription drug management.         Wyvonnia Dusky,  MD 07/18/22 1049

## 2022-07-17 NOTE — ED Notes (Signed)
TLSO was applied by Ortho Tech.

## 2022-07-17 NOTE — ED Notes (Signed)
Called Ortho Tech @ [facility]; stated that the order will be put in for someone to bring the TSLO Brace.

## 2022-07-17 NOTE — ED Triage Notes (Signed)
Pt has had back pain for months now, he has had MRI /no results given to pt yet. He was given percocet but feels like throat was closing up and his Md was on vacation and unable to change med. Has been off percocet for 2 weeks, now severe pain, hard to walk.

## 2022-07-17 NOTE — Discharge Instructions (Addendum)
You were diagnosed with a spinal fracture on your CAT scan today.  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  This cancer can weaken the bones and cause fractures.  I recommend that you wear the back brace that was provided at all times.  You will need to follow-up with an oncologist, please call the number above to schedule an appointment with an oncologist here.  You should also follow-up in the spine clinic.  Please reach out to your primary care doctor's office as well to let them know about your diagnosis and your work-up today.  Part of the cancer work-up has been started by your doctor.  They can coordinate with the oncologist.

## 2022-07-17 NOTE — ED Provider Notes (Signed)
[facility] EMERGENCY DEPT Provider Note   CSN: [id number] Arrival date & time: 07/17/22  1231     History  Chief Complaint  Patient presents with   Back Pain    Samuel [name] is a 56 y.o. male.  Patient is a 56 year old male with a history of diabetes and back pain currently being worked up by his primary doctor presenting to the emergency department with acute worsening of his back pain.  The patient states that his back pain has been going on for several months.  He had CT imaging that showed lytic lesions and states that he had a MRI by his primary doctor within the last few weeks.  He states that he was referred to urology for cancer work-up.  He states he was prescribed Percocet for his pain which had been helping but he felt like it made it difficult to swallow.  He states that he ran out 2 weeks ago and has been taking Tylenol and Motrin for pain but the pain has been worsening over the last few days.  He states that his primary doctor is on vacation so he was unable to get a medication refill or his results of his MRI.  He states that he feels a burning sensation in his bilateral thighs and feels generally weak in his legs.  He states he is not have any saddle anesthesia and denies any difficulty with urination or bowel movements.  He denies any fevers or chills.  He denies any trauma or falls.  He denies any history of IVDU.  The history is provided by the patient and the spouse.  Back Pain      Home Medications Prior to Admission medications   Medication Sig Start Date End Date Taking? Authorizing Provider  aspirin 81 MG EC tablet Take 1 tablet (81 mg total) by mouth daily. Swallow whole. 03/26/21   Nwoko, Terese Door, PA  atorvastatin (LIPITOR) 40 MG tablet Take 1 tablet (40 mg total) by mouth daily. 06/16/21   Charlott Rakes, MD  Blood Glucose Monitoring Suppl (TRUE METRIX METER) w/Device KIT Check blood sugar 3 time daily. 06/16/21   Charlott Rakes, MD   escitalopram (LEXAPRO) 20 MG tablet Take 1 tablet (20 mg total) by mouth daily. 06/16/21   Charlott Rakes, MD  gabapentin (NEURONTIN) 300 MG capsule Take 1 capsule (300 mg total) by mouth 3 (three) times daily. 06/16/21   Charlott Rakes, MD  glucose blood (TRUE METRIX BLOOD GLUCOSE TEST) test strip Check blood sugar 3 times daily. 06/16/21   Charlott Rakes, MD  hydrOXYzine (ATARAX/VISTARIL) 25 MG tablet TAKE 1 TABLET (25 MG TOTAL) BY MOUTH EVERY 6 (SIX) HOURS AS NEEDED. 04/21/21   Dara Hoyer, PA-C  insulin glargine (LANTUS) 100 UNIT/ML Solostar Pen Inject 32 Units into the skin daily. 06/16/21 09/18/21  Charlott Rakes, MD  metFORMIN (GLUCOPHAGE) 1000 MG tablet Take 1 tablet (1,000 mg total) by mouth 2 (two) times daily. 06/16/21   Charlott Rakes, MD  Multiple Vitamin (MULTIVITAMIN WITH MINERALS) TABS tablet Take 1 tablet by mouth daily. 03/22/20   Samuella Cota, MD  pantoprazole (PROTONIX) 40 MG tablet Take 1 tablet (40 mg total) by mouth daily. 06/16/21   Charlott Rakes, MD  sildenafil (VIAGRA) 25 MG tablet Take 1 tablet (25 mg total) 1/2 hour to 1 hour prior to intercourse as needed. Limit use to 1/2 tablet or 1 tablet per 24 hours. 05/18/21   Camillia Herter, NP  traZODone (DESYREL) 50 MG  tablet Take 1 tablet (50 mg total) by mouth at bedtime as needed for sleep. 06/16/21   Charlott Rakes, MD  TRUEplus Lancets 28G MISC Check blood sugar 3 times daily. 06/16/21   Charlott Rakes, MD      Allergies    Penicillins    Review of Systems   Review of Systems  Musculoskeletal:  Positive for back pain.    Physical Exam Updated Vital Signs BP 111/71 (BP Location: Left Arm)   Pulse 99   Temp 98.5 F (36.9 C)   Resp 16   SpO2 100%  Physical Exam Vitals and nursing note reviewed.  Constitutional:      Appearance: Normal appearance.     Comments: Uncomfortable appearing laying on left side  HENT:     Head: Normocephalic and atraumatic.     Nose: Nose normal.     Mouth/Throat:      Mouth: Mucous membranes are moist.     Pharynx: Oropharynx is clear.  Eyes:     Extraocular Movements: Extraocular movements intact.     Conjunctiva/sclera: Conjunctivae normal.  Neck:     Comments: No midline neck tenderness Cardiovascular:     Rate and Rhythm: Normal rate and regular rhythm.     Pulses: Normal pulses.     Heart sounds: Normal heart sounds.  Pulmonary:     Effort: Pulmonary effort is normal.     Breath sounds: Normal breath sounds.  Abdominal:     General: Abdomen is flat.     Palpations: Abdomen is soft.     Tenderness: There is no abdominal tenderness. There is no right CVA tenderness or left CVA tenderness.  Musculoskeletal:        General: Normal range of motion.     Cervical back: Normal range of motion and neck supple.     Comments: Midline lumbar spinal tenderness, right-sided lumbar paraspinal muscle tenderness with palpable spasm  Skin:    General: Skin is warm and dry.     Findings: No rash.  Neurological:     General: No focal deficit present.     Mental Status: He is alert and oriented to person, place, and time.     Sensory: No sensory deficit.     Motor: No weakness.  Psychiatric:        Mood and Affect: Mood normal.        Behavior: Behavior normal.     ED Results / Procedures / Treatments   Labs (all labs ordered are listed, but only abnormal results are displayed) Labs Reviewed - No data to display  EKG None  Radiology No results found.  Procedures Procedures    Medications Ordered in ED Medications  lidocaine (LIDODERM) 5 % 1-3 patch (1 patch Transdermal Patch Applied 07/17/22 1528)  acetaminophen (TYLENOL) tablet 1,000 mg (1,000 mg Oral Given 07/17/22 1523)  ketorolac (TORADOL) 30 MG/ML injection 30 mg (30 mg Intramuscular Given 07/17/22 1526)  cyclobenzaprine (FLEXERIL) tablet 10 mg (10 mg Oral Given 07/17/22 1523)  oxyCODONE (Oxy IR/ROXICODONE) immediate release tablet 5 mg (5 mg Oral Given 07/17/22 1523)    ED Course/  Medical Decision Making/ A&P Clinical Course as of 07/17/22 1546  Sun Jul 17, 2022  1545 Patient signed out to Dr. Ranelle Oyster pending CT and reassessment [VK]    Clinical Course User Index [VK] Ottie Glazier, DO  Medical Decision Making This patient presents to the ED with chief complaint(s) of back pain with pertinent past medical history of spinal lytic lesions which further complicates the presenting complaint. The complaint involves an extensive differential diagnosis and also carries with it a high risk of complications and morbidity.    The differential diagnosis includes fracture, spinal cord injury such as cauda equina or epidural abscess less likely as the patient has no neurologic deficits on exam, no fevers and no IVDU history, nephrolithiasis or pyelonephritis less likely as the patient has no urinary symptoms or CVA tenderness  Additional history obtained: Additional history obtained from family Records reviewed Primary Care Documents  ED Course and Reassessment: Patient will have CT of his lumbar spine performed to evaluate for occult fracture.  I am unfortunately unable to review the results of his recent MRI, but the patient has no acute neurologic deficits on exam at this time making cauda equina or spinal epidural abscess less likely.  The patient will have a PVR performed to evaluate for any urinary retention.  He will be given pain control and be reassessed.  Independent labs interpretation:  N/A  Independent visualization of imaging: - Pending     Amount and/or Complexity of Data Reviewed Radiology: ordered.  Risk OTC drugs. Prescription drug management.           Final Clinical Impression(s) / ED Diagnoses Final diagnoses:  None    Rx / DC Orders ED Discharge Orders     None         Ottie Glazier, DO 07/17/22 1546

## 2022-07-18 ENCOUNTER — Telehealth: Payer: Self-pay | Admitting: Radiation Therapy

## 2022-07-18 ENCOUNTER — Encounter: Payer: Self-pay | Admitting: Radiation Oncology

## 2022-07-18 NOTE — Telephone Encounter (Addendum)
Unable to reach the patient with the telephone numbers on file. I have also attempted contact by sending an e-mail to the address on file, awaiting reply to get patient set up with Dr. Tammi Klippel for metastatic cancer workup/staging and treatment.    Update:  Pt is homeless and has been staying at [facility] [place], [phone]. He was last seen yesterday, before going to the ED. I left a message with them requesting to have the patient call me back, if he returns.   Mont Dutton R.T.(R)(T) Radiation Special Procedures Navigator

## 2022-07-19 ENCOUNTER — Other Ambulatory Visit: Payer: Self-pay | Admitting: Radiation Therapy

## 2022-07-19 ENCOUNTER — Ambulatory Visit
Admission: RE | Admit: 2022-07-19 | Discharge: 2022-07-19 | Disposition: A | Discharge status: Home / Self Care | Payer: Self-pay | Source: Ambulatory Visit | Attending: Radiation Oncology | Admitting: Radiation Oncology

## 2022-07-19 ENCOUNTER — Other Ambulatory Visit: Payer: Self-pay

## 2022-07-19 ENCOUNTER — Ambulatory Visit
Admission: RE | Admit: 2022-07-19 | Discharge: 2022-07-19 | Disposition: A | Discharge status: Home / Self Care | Payer: BLUE CROSS/BLUE SHIELD | Source: Ambulatory Visit | Attending: Radiation Oncology | Admitting: Radiation Oncology

## 2022-07-19 DIAGNOSIS — C7951 Secondary malignant neoplasm of bone: Secondary | ICD-10-CM

## 2022-07-19 HISTORY — DX: Secondary malignant neoplasm of bone: C79.51

## 2022-07-19 NOTE — Progress Notes (Signed)
Radiation Oncology         [phone] ________________________________  Initial outpatient Consultation - Conducted via telephone due to current COVID-19 concerns for limiting Samuel exposure  Name: Samuel [name] MRN: [id number]  Date of Service: 07/19/2022 DOB: [birth year]  BE:MLJQGB, Nyoka Cowden, MD  Consuella Lose, MD   REFERRING PHYSICIAN: Consuella Lose, MD  DIAGNOSIS: 56 yo gentleman with L2 pathologic fracture from suspected malignancy    ICD-10-CM   1. 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 male seen at the request of the [facility]. He initially presented to the ED with back pain on 04/04/22, and abdomen/pelvic CT showed an expansile lucency in the L2 vertebral body and lucent lesion at L4.  Same day MRI lumbar spine confirmed these findings suggesting malignancy. He was given pain medications and released. He returned to the ED when his pain began to involve his right hip and radiate down his right leg. He established care with Dr. Martinique on 06/13/22. At that visit, in addition to the back pain, he also reported weight loss of ~30 lbs since 02/2022. PSA obtained that day was 0.6.  Serum protein electrophoresis was negative for myeloma.  He also underwent chest CT on June 28, 2022 which showed a few ill-defined elongated nodules within the lung fissures but no evidence of malignancy within the chest.  During this time, Samuel was homeless and residing in a shelter in [place] which led to some challenges with his work-up.  His back pain became precipitously worse and he proceeded to the ED at [facility] on 07/17/22 for back pain. Lumbar spine CT performed at that time showed: worsening pathologic fracture of L2 vertebral body, now with >50% height loss and approximately 5 mm of retropulsion; pathologic fracture of right L2 pedicle with intra-aricular extension into right L1-2 facet joint; lytic  lesion in right L3 pedicle with subtle pathologic fracture and in spinous process of L4 with involvement of bilateral lamina; canal stenosis at L2 secondary to bony retropulsion in presumed tumor involvement of epidural space and right L2-3 neural foramina.  The ED provider reviewed the case with neurosurgery and his case was presented at our brain and spine conference earlier this week.  I reached out today, to help establish care and expedite work-up of presumed new cancer.  PREVIOUS RADIATION THERAPY: No  PAST MEDICAL HISTORY:  Past Medical History:  Diagnosis Date   Diabetes mellitus without complication ([place])    Pancreatitis unk      PAST SURGICAL HISTORY:No past surgical history on file.  FAMILY HISTORY: No family history on file.  SOCIAL HISTORY:  Social History   Socioeconomic History   Marital status: Single    Spouse name: Not on file   Number of children: Not on file   Years of education: Not on file   Highest education level: Not on file  Occupational History   Not on file  Tobacco Use   Smoking status: Every Day    Packs/day: 1.00    Types: Cigarettes   Smokeless tobacco: Never  Substance and Sexual Activity   Alcohol use: Not Currently    Alcohol/week: 50.0 standard drinks of alcohol    Types: 50 Cans of beer per week    Comment: last thursday - a couple of beers   Drug use: No   Sexual activity: Yes  Other Topics Concern  Not on file  Social History Narrative   Not on file   Social Determinants of Health   Financial Resource Strain: Not on file  Food Insecurity: Not on file  Transportation Needs: Not on file  Physical Activity: Not on file  Stress: Not on file  Social Connections: Not on file  Intimate Partner Violence: Not on file    ALLERGIES: Penicillins  MEDICATIONS:  Current Outpatient Medications  Medication Sig Dispense Refill   aspirin 81 MG EC tablet Take 1 tablet (81 mg total) by mouth daily. Swallow whole. 30 tablet 11    atorvastatin (LIPITOR) 40 MG tablet Take 1 tablet (40 mg total) by mouth daily. 30 tablet 2   Blood Glucose Monitoring Suppl (TRUE METRIX METER) w/Device KIT Check blood sugar 3 time daily. 1 kit 0   cyclobenzaprine (FLEXERIL) 10 MG tablet Take 1 tablet (10 mg total) by mouth 2 (two) times daily as needed for up to 20 doses for muscle spasms. 20 tablet 0   escitalopram (LEXAPRO) 20 MG tablet Take 1 tablet (20 mg total) by mouth daily. 30 tablet 2   gabapentin (NEURONTIN) 300 MG capsule Take 1 capsule (300 mg total) by mouth 3 (three) times daily. 90 capsule 2   glucose blood (TRUE METRIX BLOOD GLUCOSE TEST) test strip Check blood sugar 3 times daily. 100 each 2   hydrOXYzine (ATARAX/VISTARIL) 25 MG tablet TAKE 1 TABLET (25 MG TOTAL) BY MOUTH EVERY 6 (SIX) HOURS AS NEEDED. 30 tablet 0   insulin glargine (LANTUS) 100 UNIT/ML Solostar Pen Inject 32 Units into the skin daily. 15 mL 1   metFORMIN (GLUCOPHAGE) 1000 MG tablet Take 1 tablet (1,000 mg total) by mouth 2 (two) times daily. 60 tablet 2   methylPREDNISolone (MEDROL DOSEPAK) 4 MG TBPK tablet Take as directed on package 21 tablet 0   Multiple Vitamin (MULTIVITAMIN WITH MINERALS) TABS tablet Take 1 tablet by mouth daily.     oxyCODONE (ROXICODONE) 5 MG immediate release tablet Take 1 tablet (5 mg total) by mouth every 6 (six) hours as needed for up to 20 doses for severe pain. 20 tablet 0   pantoprazole (PROTONIX) 40 MG tablet Take 1 tablet (40 mg total) by mouth daily. 30 tablet 2   sildenafil (VIAGRA) 25 MG tablet Take 1 tablet (25 mg total) 1/2 hour to 1 hour prior to intercourse as needed. Limit use to 1/2 tablet or 1 tablet per 24 hours. 30 tablet 1   traZODone (DESYREL) 50 MG tablet Take 1 tablet (50 mg total) by mouth at bedtime as needed for sleep. 30 tablet 2   TRUEplus Lancets 28G MISC Check blood sugar 3 times daily. 100 each 2   No current facility-administered medications for this encounter.    REVIEW OF SYSTEMS:  On review of  systems, the Samuel reports that he is doing well overall. He denies any chest pain, shortness of breath, cough, fevers, chills, night sweats, unintended weight changes. He denies any bowel or bladder disturbances, and denies abdominal pain, nausea or vomiting. He reports numbness/weakness to his lower back and right leg with right foot and thigh numbness. A complete review of systems is obtained and is otherwise negative.    PHYSICAL EXAM:  Wt Readings from Last 3 Encounters:  06/16/21 162 lb 3.2 oz (73.6 kg)  05/18/21 145 lb 3.2 oz (65.9 kg)  03/09/21 148 lb (67.1 kg)   Temp Readings from Last 3 Encounters:  07/17/22 98.6 F (37 C) (Oral)  05/18/21 98 F (  36.7 C)  03/09/21 99.3 F (37.4 C) (Oral)   BP Readings from Last 3 Encounters:  07/17/22 112/70  05/18/21 127/87  03/09/21 128/80   Pulse Readings from Last 3 Encounters:  07/17/22 89  05/18/21 92  03/09/21 83   Pain Assessment Pain Score: 8  Pain Loc: Back (lower back)/10  Physical exam not performed in light of telephone consult.   KPS = 80  100 - Normal; no complaints; no evidence of disease. 90   - Able to carry on normal activity; minor signs or symptoms of disease. 80   - Normal activity with effort; some signs or symptoms of disease. 45   - Cares for self; unable to carry on normal activity or to do active work. 60   - Requires occasional assistance, but is able to care for most of his personal needs. 50   - Requires considerable assistance and frequent medical care. 38   - Disabled; requires special care and assistance. 58   - Severely disabled; hospital admission is indicated although death not imminent. 47   - Very sick; hospital admission necessary; active supportive treatment necessary. 10   - Moribund; fatal processes progressing rapidly. 0     - Dead  Karnofsky DA, Abelmann [place], Craver LS and Burchenal [facility] [phone]) The use of the nitrogen mustards in the palliative treatment of carcinoma: with particular  reference to bronchogenic carcinoma Cancer 1 634-56  LABORATORY DATA:  Lab Results  Component Value Date   WBC 8.7 03/09/2021   HGB 15.2 03/09/2021   HCT 46.2 03/09/2021   MCV 96.5 03/09/2021   PLT 216 03/09/2021   Lab Results  Component Value Date   NA 138 03/09/2021   K 4.0 03/09/2021   CL 100 03/09/2021   CO2 28 03/09/2021   Lab Results  Component Value Date   ALT 23 03/09/2021   AST 19 03/09/2021   ALKPHOS 67 03/09/2021   BILITOT 0.7 03/09/2021     RADIOGRAPHY: CT Lumbar Spine Wo Contrast  Result Date: 07/17/2022 CLINICAL DATA:  Back pain.  Known osseous metastatic disease EXAM: CT LUMBAR SPINE WITHOUT CONTRAST TECHNIQUE: Multidetector CT imaging of the lumbar spine was performed without intravenous contrast administration. Multiplanar CT image reconstructions were also generated. RADIATION DOSE REDUCTION: This exam was performed according to the departmental dose-optimization program which includes automated exposure control, adjustment of the mA and/or kV according to Samuel size and/or use of iterative reconstruction technique. COMPARISON:  MRI 04/04/2022, CT 06/28/2022, 05/05/2022 FINDINGS: Segmentation: 5 lumbar type vertebrae. Alignment: Normal. Vertebrae: Lytic lesion occupying the majority of the L2 vertebral body with involvement of the right pedicle, right transverse process, right lamina, and spinous process. Worsening pathologic fracture of the vertebral body, now with greater than 50% vertebral body height loss. Approximately 5 mm of retropulsion. The right L2 pedicle is also pathologically fractured with intra-articular extension into the right L1-2 facet joint. Lytic lesion centered in the right L3 pedicle with subtle pathologic fracture (series 3, image 62). This lesion is new from 04/04/2022. Lytic lesion centered within the spinous process of the L4 vertebral body with involvement of the bilateral lamina. Similar in appearance to prior. Small extraosseous soft  tissue masses are seen associated with the lesion at the right L2 transverse process and pedicle as well as at the L4 spinous process. Paraspinal and other soft tissues: Punctate nonobstructing right renal calculi. Aortic atherosclerosis. Disc levels: Degenerative disc disease of L5-S1. Canal stenosis at the L2 vertebral body level secondary to  bony retropulsion in presumed tumor involvement of the epidural space and right L2-3 neural foramina. IMPRESSION: 1. Worsening pathologic fracture of the L2 vertebral body, now with greater than 50% vertebral body height loss and approximately 5 mm of retropulsion. The right L2 pedicle is also pathologically fractured with intra-articular extension into the right L1-2 facet joint. 2. Lytic lesion centered in the right L3 pedicle with subtle pathologic fracture, new from 04/04/2022. 3. Lytic lesion centered within the spinous process of L4 vertebral body with involvement of the bilateral lamina, similar in appearance to prior. 4. Canal stenosis at the L2 vertebral body level secondary to bony retropulsion in presumed tumor involvement of the epidural space and right L2-3 neural foramina. Aortic Atherosclerosis (ICD10-I70.0). Electronically Signed   By: Davina Poke D.O.   On: 07/17/2022 16:24      IMPRESSION/PLAN: This visit was conducted via telephone to spare the Samuel unnecessary potential exposure in the healthcare setting during the current COVID-19 pandemic.  62. 56 y.o. man with spinal osseous metastatic disease of unknown primary.  Next steps in his work-up could include a PET/CT for staging as well as CT-guided biopsy with radiofrequency ablation and kyphoplasty of the L2 vertebral body.  We will place a referral for both at this time.  Today, we talked to the Samuel and family about the findings and workup thus far. We discussed the osseous metastatic disease and general treatment, highlighting the role of radiotherapy in the management. We discussed the  available radiation techniques, and focused on the details and logistics of delivery. We reviewed the anticipated acute and late sequelae associated with radiation in this setting. The Samuel was encouraged to ask questions that were answered to his satisfaction.   I will refer him to Dr. Lorenza Chick for L2 biopsy, radiofrequency ablation and kyphoplasty.   Given current concerns for Samuel exposure during the COVID-19 pandemic, this encounter was conducted via telephone. The Samuel was notified in advance and was offered a [facility] meeting to allow for face to face communication but unfortunately reported that he did not have the appropriate resources/technology to support such a visit and instead preferred to proceed with telephone consult. The Samuel has given verbal consent for this type of encounter. The time spent during this encounter was 30 minutes. The attendants for this meeting include Tyler Pita MD, Ashlyn Bruning PA-C, Samuel [name]  During the encounter, Tyler Pita MD and Freeman Caldron PA-C were located at [facility] Radiation Oncology Department.  Samuel [name]  was located at home.       Tyler Pita, MD  [facility]  Radiation Oncology Direct Dial: [phone]  Fax: [phone] [email or web address]  Skype  LinkedIn   This document serves as a record of services personally performed by Tyler Pita, MD and Freeman Caldron, PA-C. It was created on their behalf by Wilburn Mylar, a trained medical scribe. The creation of this record is based on the scribe's personal observations and the provider's statements to them. This document has been checked and approved by the attending provider.

## 2022-07-19 NOTE — Progress Notes (Signed)
Histology and Location of Primary Cancer: Unknown Primary with Spinal mets  Sites of Visceral and Bony Metastatic Disease: Lumbar spine  Location(s) of Symptomatic Metastases: L2-L3  07/17/2022 Dr. Ernesto Rutherford CT Lumbar Spine without Contrast CLINICAL DATA:  Back pain.  Known osseous metastatic disease  FINDINGS: Segmentation: 5 lumbar type vertebrae.   Alignment: Normal.   Vertebrae: Lytic lesion occupying the majority of the L2 vertebral body with involvement of the right pedicle, right transverse process, right lamina, and spinous process. Worsening pathologic fracture of the vertebral body, now with greater than 50% vertebral body height loss. Approximately 5 mm of retropulsion. The right L2 pedicle is also pathologically fractured with intra-articular extension into the right L1-2 facet joint.   Lytic lesion centered in the right L3 pedicle with subtle pathologic fracture (series 3, image 62). This lesion is new from 04/04/2022.   Lytic lesion centered within the spinous process of the L4 vertebral body with involvement of the bilateral lamina. Similar in appearance to prior.   Small extraosseous soft tissue masses are seen associated with the lesion at the right L2 transverse process and pedicle as well as at the L4 spinous process.   Paraspinal and other soft tissues: Punctate nonobstructing right renal calculi. Aortic atherosclerosis.   Disc levels: Degenerative disc disease of L5-S1. Canal stenosis at the L2 vertebral body level secondary to bony retropulsion in presumed tumor involvement of the epidural space and right L2-3 neural foramina.   IMPRESSION: 1. Worsening pathologic fracture of the L2 vertebral body, now with greater than 50% vertebral body height loss and approximately 5 mm of retropulsion. The right L2 pedicle is also pathologically fractured with intra-articular extension into the right L1-2 facet joint. 2. Lytic lesion centered in the right L3  pedicle with subtle pathologic fracture, new from 04/04/2022. 3. Lytic lesion centered within the spinous process of L4 vertebral body with involvement of the bilateral lamina, similar in appearance to prior. 4. Canal stenosis at the L2 vertebral body level secondary to bony retropulsion in presumed tumor involvement of the epidural space and right L2-3 neural foramina. Aortic Atherosclerosis (ICD10-I70.0).  03/20/2020 Dr. Kerman Passey CT Abdomen Pelvis with Contrast CLINICAL DATA:  Epigastric and left upper quadrant pain for 2 days, nausea, vomiting and diarrhea.  FINDINGS: Lower chest: Lung bases are clear. Normal heart size. No pericardial effusion.   Hepatobiliary: New region of linear hypoattenuation in posterior right lobe liver between segments VII and VIII, extends from the porta hepatis to the posterior surface of the liver, not evident on comparison exams. No other focal liver lesion. Smooth liver surface contour. Normal hepatic attenuation. Normal gallbladder. No visible calcified gallstones or biliary ductal dilatation.   Pancreas: Mild pancreatic atrophy. No peripancreatic inflammation or ductal dilatation.   Spleen: Normal in size without focal abnormality.   Adrenals/Urinary Tract: Normal adrenal glands. Kidneys enhance and excrete symmetrically. No worrisome renal lesions. No urolithiasis or hydronephrosis. Urinary bladder is unremarkable.   Stomach/Bowel: Distal esophagus and stomach are unremarkable.  Fluid-filled duodenum with some mild mural thickening along the second and third portions. Numerous fluid-filled loops of small bowel. No frank wall thickening or distention. A normal appendix is visualized. No colonic dilatation or wall thickening. Scattered colonic diverticula without focal pericolonic inflammation to suggest diverticulitis. No evidence of mechanical obstruction.   Vascular/Lymphatic: Atherosclerotic plaque within the normal caliber aorta. No suspicious or  enlarged lymph nodes in the included lymphatic chains.   Reproductive: The prostate and seminal vesicles are unremarkable.   Other: No abdominopelvic  free fluid or free gas. No bowel containing hernias.   Musculoskeletal: Multilevel degenerative changes are present in the imaged portions of the spine. Features most pronounced at L5-S1 with mild retrolisthesis and facet hypertrophic changes resulting in mild canal stenosis and moderate bilateral foraminal narrowing.   IMPRESSION: 1. Fluid-filled duodenum with some mild mural thickening along the second and third portions additional fluid-filled loops of non thickened, nondistended small bowel. Findings are nonspecific but can be seen in the setting of a duodenitis and/or gastroenteritis. 2. New region of linear hypoattenuation in the posterior right lobe liver between segments VII and VIII, not evident on comparison exams. Appearance is indeterminate on this examination with potential etiologies including a small hepatic infarct, sequela of prior injury or trauma, or irregular fatty infiltration. Could consider further evaluation with MRI. 3. Colonic diverticulosis without evidence of diverticulitis. 4. Multilevel degenerative changes most pronounced at L5-S1 with mild retrolisthesis and facet hypertrophic changes resulting in mild canal stenosis and moderate bilateral foraminal narrowing. 5. Aortic Atherosclerosis (ICD10-I70.0).     Past/Anticipated chemotherapy by medical oncology, if any: NA  Pain on a scale of 0-10 is:  8/10 to lower back, right hip and right thigh.   If Spine Met(s), symptoms, if any, include: Bowel/Bladder retention or incontinence (please describe):  No Numbness or weakness in extremities (please describe): Yes , lower back and right leg, and right foot/thigh numbness. Current Decadron regimen, if applicable: No  Ambulatory status? Walker? Wheelchair?: Ambulates independently.  SAFETY ISSUES: Prior radiation?  No Pacemaker/ICD? No Possible current pregnancy? Male Is the patient on methotrexate? No  Current Complaints / other details:  Need more information on treatment options.

## 2022-07-20 ENCOUNTER — Other Ambulatory Visit (HOSPITAL_COMMUNITY): Payer: Self-pay | Admitting: Neuroradiology

## 2022-07-20 ENCOUNTER — Other Ambulatory Visit: Payer: Self-pay | Admitting: Radiation Therapy

## 2022-07-20 DIAGNOSIS — C7951 Secondary malignant neoplasm of bone: Secondary | ICD-10-CM

## 2022-07-21 ENCOUNTER — Telehealth: Payer: Self-pay | Admitting: Hematology and Oncology

## 2022-07-21 NOTE — Telephone Encounter (Signed)
Scheduled appt per 8/29 referral. Pt is aware of appt date and time. Pt is aware to arrive 15 mins prior to appt time and to bring and updated insurance card. Pt is aware of appt location.   

## 2022-07-22 ENCOUNTER — Ambulatory Visit (HOSPITAL_COMMUNITY)
Admission: RE | Admit: 2022-07-22 | Discharge: 2022-07-22 | Disposition: A | Discharge status: Home / Self Care | Payer: BLUE CROSS/BLUE SHIELD | Source: Ambulatory Visit | Attending: Neuroradiology | Admitting: Neuroradiology

## 2022-07-22 ENCOUNTER — Other Ambulatory Visit: Payer: Self-pay | Admitting: Radiation Oncology

## 2022-07-22 ENCOUNTER — Telehealth: Payer: Self-pay | Admitting: Radiation Therapy

## 2022-07-22 DIAGNOSIS — C7951 Secondary malignant neoplasm of bone: Secondary | ICD-10-CM

## 2022-07-22 MED ORDER — OXYCODONE HCL 5 MG PO TABS: 5.0000 mg | ORAL_TABLET | Freq: Four times a day (QID) | ORAL | 0 refills | Status: DC | PRN

## 2022-07-22 NOTE — Telephone Encounter (Signed)
I called Mr. Samuel [name] to check in and see how his visit with IR went this morning. He felt good about the explanation of the procedure and now is just waiting for that team to get insurance approval and add him to their schedule. While we were on the phone, Mr. Samuel [name] requested a refill on his pain medication. Dr. Tammi Klippel sent in the refill to [facility] on [address]. Mr. Samuel [name] was notified and very thankful for the call and the quick response to help insure he does not go into a long weekend without pain medication.   Mont Dutton R.T.(R)(T) Radiation Special Procedures Navigator

## 2022-07-26 ENCOUNTER — Other Ambulatory Visit (HOSPITAL_COMMUNITY): Payer: Self-pay | Admitting: Neuroradiology

## 2022-07-26 DIAGNOSIS — C7951 Secondary malignant neoplasm of bone: Secondary | ICD-10-CM

## 2022-07-26 NOTE — Consult Note (Signed)
Chief Complaint: Patient was seen in consultation today for pathologic lumbar spinal fracture.  Referring Physician(s): Tyler Pita  Supervising Physician: Pedro Earls  Patient Status: [facility] - Out-pt  History of Present Illness: Samuel [name] is a 56 y.o. male with a past medical history significant for pancreatitis and diabetes mellitus. Patient reports back pain and weight loss for approximately 4 months.  Samuel [name] presented to ED in May due to back pain and a CT of the abdomen showed a lytic L2 vertebral body lesion as well as an L4 spinous process lesion without intra-abdominal malignancy.  A chest CT performed June 28, 2022 showed no definitive evidence of malignancy.  Due to worsening back pain, patient again presented to ED on 07/17/2022.  CT of the lumbar spine showed worsening presumed pathologic fracture of the L2 vertebral body with small retropulsion, extension into the posterior elements and more than 50% height loss, new lytic lesion in the L3 right pedicle and progression of the lytic lesion in the L4 spinous process, now involving the lamina bilaterally.   Patient has persistent back pain severely limiting quality of life.  Samuel [name] rates the pain 12/10 when Samuel [name] wakes up with some improvement after Samuel [name] takes medication when it lowers to a more tolerable degree, with worsening to 7-8/10 when moving.  The pain radiates to the right leg and severely limits his ability to walk.    Past Medical History:  Diagnosis Date   Diabetes mellitus without complication ([place])    Pancreatitis unk    No past surgical history on file.  Allergies: Penicillins  Medications: Prior to Admission medications   Medication Sig Start Date End Date Taking? Authorizing Provider  aspirin 81 MG EC tablet Take 1 tablet (81 mg total) by mouth daily. Swallow whole. 03/26/21   Nwoko, Terese Door, PA  atorvastatin (LIPITOR) 40 MG tablet Take 1 tablet (40 mg total) by mouth daily. 06/16/21    Charlott Rakes, MD  Blood Glucose Monitoring Suppl (TRUE METRIX METER) w/Device KIT Check blood sugar 3 time daily. 06/16/21   Charlott Rakes, MD  cyclobenzaprine (FLEXERIL) 10 MG tablet Take 1 tablet (10 mg total) by mouth 2 (two) times daily as needed for up to 20 doses for muscle spasms. 07/17/22   Wyvonnia Dusky, MD  escitalopram (LEXAPRO) 20 MG tablet Take 1 tablet (20 mg total) by mouth daily. 06/16/21   Charlott Rakes, MD  gabapentin (NEURONTIN) 300 MG capsule Take 1 capsule (300 mg total) by mouth 3 (three) times daily. 06/16/21   Charlott Rakes, MD  glucose blood (TRUE METRIX BLOOD GLUCOSE TEST) test strip Check blood sugar 3 times daily. 06/16/21   Charlott Rakes, MD  hydrOXYzine (ATARAX/VISTARIL) 25 MG tablet TAKE 1 TABLET (25 MG TOTAL) BY MOUTH EVERY 6 (SIX) HOURS AS NEEDED. 04/21/21   Dara Hoyer, PA-C  insulin glargine (LANTUS) 100 UNIT/ML Solostar Pen Inject 32 Units into the skin daily. 06/16/21 09/18/21  Charlott Rakes, MD  metFORMIN (GLUCOPHAGE) 1000 MG tablet Take 1 tablet (1,000 mg total) by mouth 2 (two) times daily. 06/16/21   Charlott Rakes, MD  methylPREDNISolone (MEDROL DOSEPAK) 4 MG TBPK tablet Take as directed on package 07/17/22   Wyvonnia Dusky, MD  Multiple Vitamin (MULTIVITAMIN WITH MINERALS) TABS tablet Take 1 tablet by mouth daily. 03/22/20   Samuella Cota, MD  oxyCODONE (ROXICODONE) 5 MG immediate release tablet Take 1 tablet (5 mg total) by mouth every 6 (six) hours as needed for up to  60 doses (For Cancer Pain in the Spine). 07/22/22   Tyler Pita, MD  pantoprazole (PROTONIX) 40 MG tablet Take 1 tablet (40 mg total) by mouth daily. 06/16/21   Charlott Rakes, MD  sildenafil (VIAGRA) 25 MG tablet Take 1 tablet (25 mg total) 1/2 hour to 1 hour prior to intercourse as needed. Limit use to 1/2 tablet or 1 tablet per 24 hours. 05/18/21   Camillia Herter, NP  traZODone (DESYREL) 50 MG tablet Take 1 tablet (50 mg total) by mouth at bedtime as needed for sleep.  06/16/21   Charlott Rakes, MD  TRUEplus Lancets 28G MISC Check blood sugar 3 times daily. 06/16/21   Charlott Rakes, MD     No family history on file.  Social History   Socioeconomic History   Marital status: Single    Spouse name: Not on file   Number of children: Not on file   Years of education: Not on file   Highest education level: Not on file  Occupational History   Not on file  Tobacco Use   Smoking status: Every Day    Packs/day: 1.00    Types: Cigarettes   Smokeless tobacco: Never  Substance and Sexual Activity   Alcohol use: Not Currently    Alcohol/week: 50.0 standard drinks of alcohol    Types: 50 Cans of beer per week    Comment: last thursday - a couple of beers   Drug use: No   Sexual activity: Yes  Other Topics Concern   Not on file  Social History Narrative   Not on file   Social Determinants of Health   Financial Resource Strain: Not on file  Food Insecurity: Not on file  Transportation Needs: Not on file  Physical Activity: Not on file  Stress: Not on file  Social Connections: Not on file     Review of Systems: A 12 point ROS discussed and pertinent positives are indicated in the HPI above.  All other systems are negative.  Review of Systems  Vital Signs: There were no vitals taken for this visit.  Physical Exam Constitutional:      Appearance: Normal appearance.  HENT:     Head: Normocephalic and atraumatic.  Pulmonary:     Effort: Pulmonary effort is normal. No respiratory distress.  Musculoskeletal:       Back:  Neurological:     Mental Status: Samuel [name] is alert and oriented to person, place, and time. Mental status is at baseline.          Imaging: CT Lumbar Spine Wo Contrast  Result Date: 07/17/2022 CLINICAL DATA:  Back pain.  Known osseous metastatic disease EXAM: CT LUMBAR SPINE WITHOUT CONTRAST TECHNIQUE: Multidetector CT imaging of the lumbar spine was performed without intravenous contrast administration. Multiplanar CT  image reconstructions were also generated. RADIATION DOSE REDUCTION: This exam was performed according to the departmental dose-optimization program which includes automated exposure control, adjustment of the mA and/or kV according to patient size and/or use of iterative reconstruction technique. COMPARISON:  MRI 04/04/2022, CT 06/28/2022, 05/05/2022 FINDINGS: Segmentation: 5 lumbar type vertebrae. Alignment: Normal. Vertebrae: Lytic lesion occupying the majority of the L2 vertebral body with involvement of the right pedicle, right transverse process, right lamina, and spinous process. Worsening pathologic fracture of the vertebral body, now with greater than 50% vertebral body height loss. Approximately 5 mm of retropulsion. The right L2 pedicle is also pathologically fractured with intra-articular extension into the right L1-2 facet joint. Lytic lesion centered in the  right L3 pedicle with subtle pathologic fracture (series 3, image 62). This lesion is new from 04/04/2022. Lytic lesion centered within the spinous process of the L4 vertebral body with involvement of the bilateral lamina. Similar in appearance to prior. Small extraosseous soft tissue masses are seen associated with the lesion at the right L2 transverse process and pedicle as well as at the L4 spinous process. Paraspinal and other soft tissues: Punctate nonobstructing right renal calculi. Aortic atherosclerosis. Disc levels: Degenerative disc disease of L5-S1. Canal stenosis at the L2 vertebral body level secondary to bony retropulsion in presumed tumor involvement of the epidural space and right L2-3 neural foramina. IMPRESSION: 1. Worsening pathologic fracture of the L2 vertebral body, now with greater than 50% vertebral body height loss and approximately 5 mm of retropulsion. The right L2 pedicle is also pathologically fractured with intra-articular extension into the right L1-2 facet joint. 2. Lytic lesion centered in the right L3 pedicle with  subtle pathologic fracture, new from 04/04/2022. 3. Lytic lesion centered within the spinous process of L4 vertebral body with involvement of the bilateral lamina, similar in appearance to prior. 4. Canal stenosis at the L2 vertebral body level secondary to bony retropulsion in presumed tumor involvement of the epidural space and right L2-3 neural foramina. Aortic Atherosclerosis (ICD10-I70.0). Electronically Signed   By: Davina Poke D.O.   On: 07/17/2022 16:24    Labs:  CBC: No results for input(s): "WBC", "HGB", "HCT", "PLT" in the last 8760 hours.  COAGS: No results for input(s): "INR", "APTT" in the last 8760 hours.  BMP: No results for input(s): "NA", "K", "CL", "CO2", "GLUCOSE", "BUN", "CALCIUM", "CREATININE", "GFRNONAA", "GFRAA" in the last 8760 hours.  Invalid input(s): "CMP"  LIVER FUNCTION TESTS: No results for input(s): "BILITOT", "AST", "ALT", "ALKPHOS", "PROT", "ALBUMIN" in the last 8760 hours.  TUMOR MARKERS: No results for input(s): "AFPTM", "CEA", "CA199", "CHROMGRNA" in the last 8760 hours.  Assessment and Plan:  Samuel [name] has at least 3 spinal lesions identified at L2, L3 and L4 concerning for malignancy.  The most critical lesion is at the L2 vertebral body resulting in height loss or and retropulsion.  I explained to Samuel [name] that given the absence of a diagnosis, it is important to collect a biopsy from his spinal lesion for definitive diagnosis and treatment planning.  Additionally, an L2 radiofrequency ablation for local disease control and pain treatment followed by balloon kyphoplasty for bone stabilization could benefit the patient.  Samuel [name] and his sisters asked appropriate questions.  They would like to proceed with the L2 core bone biopsy followed by radiofrequency ablation and balloon kyphoplasty.  Our schedulers we reach out to them to book the procedure under moderate sedation.   Thank you for this interesting consult.  I greatly enjoyed meeting Samuel [name] and look forward to participating in their care.  A copy of this report was sent to the requesting provider on this date.  Electronically Signed: Pedro Earls, MD 07/26/2022, 10:26 AM   I spent a total of  30 Minutes   in face to face in clinical consultation, greater than 50% of which was counseling/coordinating care for pathologic spinal fracture.

## 2022-07-28 ENCOUNTER — Inpatient Hospital Stay (HOSPITAL_COMMUNITY)
Admission: EM | Admit: 2022-07-28 | Discharge: 2022-08-17 | DRG: 477 | Disposition: A | Discharge status: Home Health | Payer: BLUE CROSS/BLUE SHIELD | Attending: Family Medicine | Admitting: Family Medicine

## 2022-07-28 ENCOUNTER — Emergency Department (HOSPITAL_COMMUNITY): Payer: BLUE CROSS/BLUE SHIELD

## 2022-07-28 ENCOUNTER — Encounter (HOSPITAL_COMMUNITY): Payer: Self-pay | Admitting: *Deleted

## 2022-07-28 ENCOUNTER — Encounter: Payer: Self-pay | Admitting: Radiation Therapy

## 2022-07-28 DIAGNOSIS — M8458XA Pathological fracture in neoplastic disease, other specified site, initial encounter for fracture: Principal | ICD-10-CM | POA: Diagnosis present

## 2022-07-28 DIAGNOSIS — R739 Hyperglycemia, unspecified: Secondary | ICD-10-CM

## 2022-07-28 DIAGNOSIS — E860 Dehydration: Secondary | ICD-10-CM | POA: Diagnosis present

## 2022-07-28 DIAGNOSIS — E876 Hypokalemia: Secondary | ICD-10-CM | POA: Diagnosis present

## 2022-07-28 DIAGNOSIS — E0800 Diabetes mellitus due to underlying condition with hyperosmolarity without nonketotic hyperglycemic-hyperosmolar coma (NKHHC): Secondary | ICD-10-CM

## 2022-07-28 DIAGNOSIS — C3490 Malignant neoplasm of unspecified part of unspecified bronchus or lung: Secondary | ICD-10-CM

## 2022-07-28 DIAGNOSIS — M549 Dorsalgia, unspecified: Secondary | ICD-10-CM

## 2022-07-28 DIAGNOSIS — E1165 Type 2 diabetes mellitus with hyperglycemia: Secondary | ICD-10-CM | POA: Diagnosis not present

## 2022-07-28 DIAGNOSIS — M5416 Radiculopathy, lumbar region: Secondary | ICD-10-CM | POA: Diagnosis present

## 2022-07-28 DIAGNOSIS — C7951 Secondary malignant neoplasm of bone: Secondary | ICD-10-CM | POA: Diagnosis present

## 2022-07-28 DIAGNOSIS — M8458XS Pathological fracture in neoplastic disease, other specified site, sequela: Secondary | ICD-10-CM

## 2022-07-28 DIAGNOSIS — Z681 Body mass index (BMI) 19 or less, adult: Secondary | ICD-10-CM

## 2022-07-28 DIAGNOSIS — Z885 Allergy status to narcotic agent status: Secondary | ICD-10-CM

## 2022-07-28 DIAGNOSIS — Z599 Problem related to housing and economic circumstances, unspecified: Secondary | ICD-10-CM

## 2022-07-28 DIAGNOSIS — E8809 Other disorders of plasma-protein metabolism, not elsewhere classified: Secondary | ICD-10-CM | POA: Diagnosis present

## 2022-07-28 DIAGNOSIS — E785 Hyperlipidemia, unspecified: Secondary | ICD-10-CM | POA: Diagnosis present

## 2022-07-28 DIAGNOSIS — Z79899 Other long term (current) drug therapy: Secondary | ICD-10-CM

## 2022-07-28 DIAGNOSIS — Z794 Long term (current) use of insulin: Secondary | ICD-10-CM

## 2022-07-28 DIAGNOSIS — D630 Anemia in neoplastic disease: Secondary | ICD-10-CM | POA: Diagnosis present

## 2022-07-28 DIAGNOSIS — S32029A Unspecified fracture of second lumbar vertebra, initial encounter for closed fracture: Secondary | ICD-10-CM | POA: Diagnosis present

## 2022-07-28 DIAGNOSIS — E119 Type 2 diabetes mellitus without complications: Secondary | ICD-10-CM

## 2022-07-28 DIAGNOSIS — M48061 Spinal stenosis, lumbar region without neurogenic claudication: Secondary | ICD-10-CM | POA: Diagnosis present

## 2022-07-28 DIAGNOSIS — F419 Anxiety disorder, unspecified: Secondary | ICD-10-CM | POA: Diagnosis present

## 2022-07-28 DIAGNOSIS — F32A Depression, unspecified: Secondary | ICD-10-CM | POA: Diagnosis present

## 2022-07-28 DIAGNOSIS — C3492 Malignant neoplasm of unspecified part of left bronchus or lung: Secondary | ICD-10-CM | POA: Diagnosis present

## 2022-07-28 DIAGNOSIS — R627 Adult failure to thrive: Secondary | ICD-10-CM | POA: Diagnosis not present

## 2022-07-28 DIAGNOSIS — R64 Cachexia: Secondary | ICD-10-CM | POA: Diagnosis present

## 2022-07-28 DIAGNOSIS — F17210 Nicotine dependence, cigarettes, uncomplicated: Secondary | ICD-10-CM | POA: Diagnosis present

## 2022-07-28 DIAGNOSIS — C7931 Secondary malignant neoplasm of brain: Secondary | ICD-10-CM | POA: Diagnosis present

## 2022-07-28 DIAGNOSIS — Z7982 Long term (current) use of aspirin: Secondary | ICD-10-CM

## 2022-07-28 DIAGNOSIS — G893 Neoplasm related pain (acute) (chronic): Secondary | ICD-10-CM | POA: Diagnosis present

## 2022-07-28 DIAGNOSIS — R54 Age-related physical debility: Secondary | ICD-10-CM | POA: Diagnosis present

## 2022-07-28 DIAGNOSIS — Z880 Allergy status to penicillin: Secondary | ICD-10-CM

## 2022-07-28 DIAGNOSIS — D696 Thrombocytopenia, unspecified: Secondary | ICD-10-CM | POA: Diagnosis not present

## 2022-07-28 DIAGNOSIS — E43 Unspecified severe protein-calorie malnutrition: Secondary | ICD-10-CM | POA: Diagnosis present

## 2022-07-28 DIAGNOSIS — E1140 Type 2 diabetes mellitus with diabetic neuropathy, unspecified: Secondary | ICD-10-CM | POA: Diagnosis present

## 2022-07-28 LAB — URINALYSIS, ROUTINE W REFLEX MICROSCOPIC
Bacteria, UA: NONE SEEN
Bilirubin Urine: NEGATIVE
Glucose, UA: >=500 mg/dL — AB
Hgb urine dipstick: NEGATIVE
Ketones, ur: 80 mg/dL — AB
Leukocytes,Ua: NEGATIVE
Nitrite: NEGATIVE
Protein, ur: NEGATIVE mg/dL
Specific Gravity, Urine: 1.036 — ABNORMAL HIGH (ref 1.005–1.030)
pH: 5 (ref 5.0–8.0)

## 2022-07-28 LAB — CBC WITH DIFFERENTIAL/PLATELET
Abs Immature Granulocytes: 0.04 10*3/uL (ref 0.00–0.07)
Basophils Absolute: 0 10*3/uL (ref 0.0–0.1)
Basophils Relative: 0 %
Eosinophils Absolute: 0.1 10*3/uL (ref 0.0–0.5)
Eosinophils Relative: 1 %
HCT: 41.8 % (ref 39.0–52.0)
Hemoglobin: 14.3 g/dL (ref 13.0–17.0)
Immature Granulocytes: 1 %
Lymphocytes Relative: 26 %
Lymphs Abs: 2 10*3/uL (ref 0.7–4.0)
MCH: 32 pg (ref 26.0–34.0)
MCHC: 34.2 g/dL (ref 30.0–36.0)
MCV: 93.5 fL (ref 80.0–100.0)
Monocytes Absolute: 0.6 10*3/uL (ref 0.1–1.0)
Monocytes Relative: 8 %
Neutro Abs: 4.9 10*3/uL (ref 1.7–7.7)
Neutrophils Relative %: 64 %
Platelets: 219 10*3/uL (ref 150–400)
RBC: 4.47 MIL/uL (ref 4.22–5.81)
RDW: 12.5 % (ref 11.5–15.5)
WBC: 7.6 10*3/uL (ref 4.0–10.5)
nRBC: 0 % (ref 0.0–0.2)

## 2022-07-28 LAB — BASIC METABOLIC PANEL
Anion gap: 17 — ABNORMAL HIGH (ref 5–15)
BUN: 13 mg/dL (ref 6–20)
CO2: 19 mmol/L — ABNORMAL LOW (ref 22–32)
Calcium: 10.1 mg/dL (ref 8.9–10.3)
Chloride: 102 mmol/L (ref 98–111)
Creatinine, Ser: 0.92 mg/dL (ref 0.61–1.24)
GFR, Estimated: >60 mL/min (ref >60)
Glucose, Bld: 314 mg/dL — ABNORMAL HIGH (ref 70–99)
Potassium: 3.9 mmol/L (ref 3.5–5.1)
Sodium: 138 mmol/L (ref 135–145)

## 2022-07-28 LAB — C-REACTIVE PROTEIN: CRP: 0.6 mg/dL (ref <1.0)

## 2022-07-28 LAB — SEDIMENTATION RATE: Sed Rate: 20 mm/hr — ABNORMAL HIGH (ref 0–16)

## 2022-07-28 NOTE — ED Provider Triage Note (Addendum)
Emergency Medicine Provider Triage Evaluation Note  Samuel [name] , a 56 y.o. male  was evaluated in triage.  Pt complains of Worsening low back pain.  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  Provider was concerned about worsening numbness to the lower extremities and looked worsening lower extremity weakness and advised ER for emergent work-up and likely admission for pain management.  Review of Systems  Positive:  Bilat le weakness Negative: Fecal incontinence, urinary retention  Physical Exam  BP 112/68   Pulse (!) 114   Temp 98.6 F (37 C) (Oral)   Resp 16   SpO2 99%  Gen:   Awake, no distress .  Cachectic Resp:  Normal effort  MSK:   Moves extremities without difficulty  Other:  Cranial nerves [name] through XII are grossly intact.  Upper extremity strength symmetric bilaterally.  Lower extremity strength is significantly reduced but symmetric bilaterally.  Medical Decision Making  Medically screening exam initiated at 7:38 PM.  Appropriate orders placed.  Samuel [name] was informed that the remainder of the evaluation will be completed by another provider, this initial triage assessment does not replace that evaluation, and the importance of remaining in the ED until their evaluation is complete.  Spoke with Dr. Sabra Heck as he received the initial phone call on call from patient's doctor.  Chest x-ray added, UA added.  We will also proceed with MRI of the thoracic and lumbar to evaluate for mets.   Sherrill Raring, PA-C 07/28/22 1939    Sherrill Raring, PA-C 07/28/22 1954

## 2022-07-28 NOTE — Progress Notes (Addendum)
This patient presented originally to [facility] in May 2023 with Rt Flank pain. 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 He was sent home and no further workup was completed. He presented again with back pain to [facility] ED on 8/27. Imaging detailed worsening of known metastatic disease in his spine. The patient was discharged again and given instruction to follow-up with Oncology. Due to his out of state insurance, he has not been able to have the ordered biopsy or PET scan to determine his primary or extent of disease involvement.    He is homeless and currently staying with his sister or the [facility] in [place]. I received a call from Samuel [name] this afternoon stating that his condition is worsening. He is now having trouble standing and or walking, and he has run out of pain medication. I have instructed Samuel [name] to go to [facility] ED and tell the staff that he was sent there by his oncology team requesting admission for proper workup and diagnosis. He needs emergent workup due to the increased weakness in his lower extremities and pain associated with the known metastatic involvement in his spine It is important to complete this work up in order to properly treat him from an oncology standpoint.   I called to inform Dr. Sabra Heck that Samuel [name] will be coming in this evening as soon as his sister gets home from work and is able to get him there.  (Between 6:30 and 7:00pm)    Dr. Tyler Pita is the Radiation Oncologist working with Samuel [name] and Dr. Consuella Lose, neurosurgeon,  is also familiar with him.   Mont Dutton R.T.(R)(T) Radiation Special Procedures Navigator

## 2022-07-28 NOTE — ED Triage Notes (Signed)
Pt c/o worsening lower back pain; has been having ongoing back pain for 4 months, scheduled for PET scan tomorrow. Pt reports his legs giving out yesterday but was able to catch himself. Denies bowel/bladder incontinence

## 2022-07-29 ENCOUNTER — Encounter (HOSPITAL_COMMUNITY): Admission: RE | Admit: 2022-07-29 | Payer: BLUE CROSS/BLUE SHIELD | Source: Ambulatory Visit

## 2022-07-29 ENCOUNTER — Emergency Department (HOSPITAL_COMMUNITY): Payer: BLUE CROSS/BLUE SHIELD

## 2022-07-29 ENCOUNTER — Encounter (HOSPITAL_COMMUNITY): Payer: Self-pay | Admitting: Internal Medicine

## 2022-07-29 ENCOUNTER — Observation Stay (HOSPITAL_COMMUNITY): Payer: BLUE CROSS/BLUE SHIELD

## 2022-07-29 DIAGNOSIS — E1140 Type 2 diabetes mellitus with diabetic neuropathy, unspecified: Secondary | ICD-10-CM | POA: Diagnosis present

## 2022-07-29 DIAGNOSIS — M8458XA Pathological fracture in neoplastic disease, other specified site, initial encounter for fracture: Secondary | ICD-10-CM | POA: Diagnosis present

## 2022-07-29 DIAGNOSIS — C3490 Malignant neoplasm of unspecified part of unspecified bronchus or lung: Secondary | ICD-10-CM | POA: Diagnosis not present

## 2022-07-29 DIAGNOSIS — E1165 Type 2 diabetes mellitus with hyperglycemia: Secondary | ICD-10-CM

## 2022-07-29 DIAGNOSIS — M48061 Spinal stenosis, lumbar region without neurogenic claudication: Secondary | ICD-10-CM | POA: Diagnosis present

## 2022-07-29 DIAGNOSIS — E119 Type 2 diabetes mellitus without complications: Secondary | ICD-10-CM | POA: Diagnosis not present

## 2022-07-29 DIAGNOSIS — D696 Thrombocytopenia, unspecified: Secondary | ICD-10-CM | POA: Diagnosis not present

## 2022-07-29 DIAGNOSIS — M5416 Radiculopathy, lumbar region: Secondary | ICD-10-CM | POA: Diagnosis present

## 2022-07-29 DIAGNOSIS — M8458XS Pathological fracture in neoplastic disease, other specified site, sequela: Secondary | ICD-10-CM | POA: Diagnosis not present

## 2022-07-29 DIAGNOSIS — E876 Hypokalemia: Secondary | ICD-10-CM | POA: Diagnosis present

## 2022-07-29 DIAGNOSIS — E43 Unspecified severe protein-calorie malnutrition: Secondary | ICD-10-CM | POA: Diagnosis present

## 2022-07-29 DIAGNOSIS — D630 Anemia in neoplastic disease: Secondary | ICD-10-CM | POA: Diagnosis present

## 2022-07-29 DIAGNOSIS — C7951 Secondary malignant neoplasm of bone: Secondary | ICD-10-CM | POA: Diagnosis present

## 2022-07-29 DIAGNOSIS — F17210 Nicotine dependence, cigarettes, uncomplicated: Secondary | ICD-10-CM | POA: Diagnosis present

## 2022-07-29 DIAGNOSIS — E1169 Type 2 diabetes mellitus with other specified complication: Secondary | ICD-10-CM | POA: Diagnosis not present

## 2022-07-29 DIAGNOSIS — E785 Hyperlipidemia, unspecified: Secondary | ICD-10-CM | POA: Diagnosis present

## 2022-07-29 DIAGNOSIS — Z681 Body mass index (BMI) 19 or less, adult: Secondary | ICD-10-CM | POA: Diagnosis not present

## 2022-07-29 DIAGNOSIS — C3492 Malignant neoplasm of unspecified part of left bronchus or lung: Secondary | ICD-10-CM | POA: Diagnosis present

## 2022-07-29 DIAGNOSIS — C801 Malignant (primary) neoplasm, unspecified: Secondary | ICD-10-CM | POA: Diagnosis not present

## 2022-07-29 DIAGNOSIS — E860 Dehydration: Secondary | ICD-10-CM | POA: Diagnosis present

## 2022-07-29 DIAGNOSIS — R54 Age-related physical debility: Secondary | ICD-10-CM | POA: Diagnosis present

## 2022-07-29 DIAGNOSIS — E11649 Type 2 diabetes mellitus with hypoglycemia without coma: Secondary | ICD-10-CM | POA: Diagnosis not present

## 2022-07-29 DIAGNOSIS — F32A Depression, unspecified: Secondary | ICD-10-CM | POA: Diagnosis present

## 2022-07-29 DIAGNOSIS — R64 Cachexia: Secondary | ICD-10-CM | POA: Diagnosis present

## 2022-07-29 DIAGNOSIS — E8809 Other disorders of plasma-protein metabolism, not elsewhere classified: Secondary | ICD-10-CM | POA: Diagnosis present

## 2022-07-29 DIAGNOSIS — Z794 Long term (current) use of insulin: Secondary | ICD-10-CM | POA: Diagnosis not present

## 2022-07-29 DIAGNOSIS — S32029A Unspecified fracture of second lumbar vertebra, initial encounter for closed fracture: Secondary | ICD-10-CM | POA: Diagnosis not present

## 2022-07-29 DIAGNOSIS — M549 Dorsalgia, unspecified: Secondary | ICD-10-CM | POA: Diagnosis present

## 2022-07-29 DIAGNOSIS — D649 Anemia, unspecified: Secondary | ICD-10-CM | POA: Diagnosis not present

## 2022-07-29 DIAGNOSIS — G893 Neoplasm related pain (acute) (chronic): Secondary | ICD-10-CM | POA: Diagnosis present

## 2022-07-29 DIAGNOSIS — C7931 Secondary malignant neoplasm of brain: Secondary | ICD-10-CM | POA: Diagnosis present

## 2022-07-29 DIAGNOSIS — R627 Adult failure to thrive: Secondary | ICD-10-CM | POA: Diagnosis not present

## 2022-07-29 LAB — CBC WITH DIFFERENTIAL/PLATELET
Abs Immature Granulocytes: 0.03 10*3/uL (ref 0.00–0.07)
Basophils Absolute: 0 10*3/uL (ref 0.0–0.1)
Basophils Relative: 1 %
Eosinophils Absolute: 0.1 10*3/uL (ref 0.0–0.5)
Eosinophils Relative: 1 %
HCT: 36 % — ABNORMAL LOW (ref 39.0–52.0)
Hemoglobin: 12.4 g/dL — ABNORMAL LOW (ref 13.0–17.0)
Immature Granulocytes: 0 %
Lymphocytes Relative: 36 %
Lymphs Abs: 2.4 10*3/uL (ref 0.7–4.0)
MCH: 32.6 pg (ref 26.0–34.0)
MCHC: 34.4 g/dL (ref 30.0–36.0)
MCV: 94.7 fL (ref 80.0–100.0)
Monocytes Absolute: 0.5 10*3/uL (ref 0.1–1.0)
Monocytes Relative: 8 %
Neutro Abs: 3.7 10*3/uL (ref 1.7–7.7)
Neutrophils Relative %: 54 %
Platelets: 190 10*3/uL (ref 150–400)
RBC: 3.8 MIL/uL — ABNORMAL LOW (ref 4.22–5.81)
RDW: 12.8 % (ref 11.5–15.5)
WBC: 6.7 10*3/uL (ref 4.0–10.5)
nRBC: 0 % (ref 0.0–0.2)

## 2022-07-29 LAB — COMPREHENSIVE METABOLIC PANEL
ALT: 8 U/L (ref 0–44)
AST: 7 U/L — ABNORMAL LOW (ref 15–41)
Albumin: 2.8 g/dL — ABNORMAL LOW (ref 3.5–5.0)
Alkaline Phosphatase: 71 U/L (ref 38–126)
Anion gap: 13 (ref 5–15)
BUN: 13 mg/dL (ref 6–20)
CO2: 20 mmol/L — ABNORMAL LOW (ref 22–32)
Calcium: 9 mg/dL (ref 8.9–10.3)
Chloride: 104 mmol/L (ref 98–111)
Creatinine, Ser: 0.89 mg/dL (ref 0.61–1.24)
GFR, Estimated: >60 mL/min (ref >60)
Glucose, Bld: 245 mg/dL — ABNORMAL HIGH (ref 70–99)
Potassium: 3.4 mmol/L — ABNORMAL LOW (ref 3.5–5.1)
Sodium: 137 mmol/L (ref 135–145)
Total Bilirubin: 0.8 mg/dL (ref 0.3–1.2)
Total Protein: 5.6 g/dL — ABNORMAL LOW (ref 6.5–8.1)

## 2022-07-29 LAB — HEMOGLOBIN A1C
Hgb A1c MFr Bld: 13 % — ABNORMAL HIGH (ref 4.8–5.6)
Mean Plasma Glucose: 326.4 mg/dL

## 2022-07-29 LAB — TSH: TSH: 1.203 u[IU]/mL (ref 0.350–4.500)

## 2022-07-29 LAB — CBG MONITORING, ED
Glucose-Capillary: 266 mg/dL — ABNORMAL HIGH (ref 70–99)
Glucose-Capillary: 210 mg/dL — ABNORMAL HIGH (ref 70–99)
Glucose-Capillary: 195 mg/dL — ABNORMAL HIGH (ref 70–99)
Glucose-Capillary: 170 mg/dL — ABNORMAL HIGH (ref 70–99)
Glucose-Capillary: 218 mg/dL — ABNORMAL HIGH (ref 70–99)

## 2022-07-29 LAB — PROTIME-INR
INR: 1.1 (ref 0.8–1.2)
Prothrombin Time: 14 seconds (ref 11.4–15.2)

## 2022-07-29 LAB — MAGNESIUM: Magnesium: 1.5 mg/dL — ABNORMAL LOW (ref 1.7–2.4)

## 2022-07-29 LAB — GLUCOSE, CAPILLARY: Glucose-Capillary: 319 mg/dL — ABNORMAL HIGH (ref 70–99)

## 2022-07-29 MED ORDER — ACETAMINOPHEN 325 MG PO TABS
650.0000 mg | ORAL_TABLET | Freq: Four times a day (QID) | ORAL | Status: DC | PRN
  Administered 2022-08-04 – 2022-08-13 (×5): 650 mg via ORAL
  Filled 2022-07-29 (×5): qty 2

## 2022-07-29 MED ORDER — SODIUM CHLORIDE 0.9 % IV BOLUS (SEPSIS)
1000.0000 mL | Freq: Once | INTRAVENOUS | Status: AC
  Administered 2022-07-29: 1000 mL via INTRAVENOUS

## 2022-07-29 MED ORDER — HYDROXYZINE HCL 25 MG PO TABS
25.0000 mg | ORAL_TABLET | Freq: Four times a day (QID) | ORAL | Status: DC | PRN
  Administered 2022-07-30 – 2022-08-01 (×2): 25 mg via ORAL
  Filled 2022-07-29 (×2): qty 1

## 2022-07-29 MED ORDER — ESCITALOPRAM OXALATE 10 MG PO TABS
20.0000 mg | ORAL_TABLET | Freq: Every day | ORAL | Status: DC
Start: 2022-07-29 — End: 2022-08-18
  Administered 2022-07-29 – 2022-08-17 (×20): 20 mg via ORAL
  Filled 2022-07-29 (×20): qty 2

## 2022-07-29 MED ORDER — ACETAMINOPHEN 650 MG RE SUPP: 650.0000 mg | Freq: Four times a day (QID) | RECTAL | Status: DC | PRN

## 2022-07-29 MED ORDER — TRAZODONE HCL 50 MG PO TABS
50.0000 mg | ORAL_TABLET | Freq: Every day | ORAL | Status: DC
  Administered 2022-07-29 – 2022-08-16 (×19): 50 mg via ORAL
  Filled 2022-07-29 (×20): qty 1

## 2022-07-29 MED ORDER — POTASSIUM CHLORIDE 10 MEQ/100ML IV SOLN
10.0000 meq | Freq: Once | INTRAVENOUS | Status: AC
Start: 2022-07-29 — End: 2022-07-29
  Administered 2022-07-29: 10 meq via INTRAVENOUS
  Filled 2022-07-29: qty 100

## 2022-07-29 MED ORDER — POLYETHYLENE GLYCOL 3350 17 G PO PACK
17.0000 g | PACK | Freq: Every day | ORAL | Status: DC
  Administered 2022-07-29 – 2022-08-16 (×12): 17 g via ORAL
  Filled 2022-07-29 (×15): qty 1

## 2022-07-29 MED ORDER — GABAPENTIN 300 MG PO CAPS
300.0000 mg | ORAL_CAPSULE | Freq: Three times a day (TID) | ORAL | Status: DC
  Administered 2022-07-29 – 2022-08-17 (×58): 300 mg via ORAL
  Filled 2022-07-29 (×58): qty 1

## 2022-07-29 MED ORDER — LACTATED RINGERS IV SOLN: INTRAVENOUS | Status: DC

## 2022-07-29 MED ORDER — INSULIN GLARGINE-YFGN 100 UNIT/ML ~~LOC~~ SOLN
32.0000 [IU] | Freq: Every day | SUBCUTANEOUS | Status: DC
  Administered 2022-07-29 – 2022-08-06 (×8): 32 [IU] via SUBCUTANEOUS
  Filled 2022-07-29 (×10): qty 0.32

## 2022-07-29 MED ORDER — MORPHINE SULFATE ER 15 MG PO TBCR
15.0000 mg | EXTENDED_RELEASE_TABLET | Freq: Two times a day (BID) | ORAL | Status: DC
  Administered 2022-07-29 – 2022-08-17 (×39): 15 mg via ORAL
  Filled 2022-07-29 (×39): qty 1

## 2022-07-29 MED ORDER — GLUCERNA SHAKE PO LIQD
237.0000 mL | Freq: Three times a day (TID) | ORAL | Status: DC
  Administered 2022-07-29 – 2022-08-09 (×24): 237 mL via ORAL
  Filled 2022-07-29 (×5): qty 237

## 2022-07-29 MED ORDER — INSULIN ASPART 100 UNIT/ML IJ SOLN
0.0000 [IU] | Freq: Three times a day (TID) | INTRAMUSCULAR | Status: DC
  Administered 2022-07-29 (×2): 5 [IU] via SUBCUTANEOUS
  Administered 2022-07-29: 3 [IU] via SUBCUTANEOUS
  Administered 2022-07-29: 8 [IU] via SUBCUTANEOUS
  Administered 2022-07-30: 2 [IU] via SUBCUTANEOUS
  Administered 2022-07-30: 5 [IU] via SUBCUTANEOUS
  Administered 2022-07-30: 2 [IU] via SUBCUTANEOUS
  Administered 2022-07-31: 3 [IU] via SUBCUTANEOUS
  Administered 2022-07-31: 5 [IU] via SUBCUTANEOUS
  Administered 2022-07-31 – 2022-08-01 (×2): 2 [IU] via SUBCUTANEOUS
  Administered 2022-08-01 – 2022-08-03 (×4): 5 [IU] via SUBCUTANEOUS
  Administered 2022-08-03: 2 [IU] via SUBCUTANEOUS
  Administered 2022-08-05: 8 [IU] via SUBCUTANEOUS
  Administered 2022-08-05 – 2022-08-06 (×3): 2 [IU] via SUBCUTANEOUS
  Administered 2022-08-06: 11 [IU] via SUBCUTANEOUS

## 2022-07-29 MED ORDER — DOCUSATE SODIUM 100 MG PO CAPS
100.0000 mg | ORAL_CAPSULE | Freq: Two times a day (BID) | ORAL | Status: DC
  Administered 2022-07-29 – 2022-08-17 (×33): 100 mg via ORAL
  Filled 2022-07-29 (×37): qty 1

## 2022-07-29 MED ORDER — LIDOCAINE 5 % EX PTCH
1.0000 | MEDICATED_PATCH | CUTANEOUS | Status: DC
  Administered 2022-07-29 – 2022-08-17 (×20): 1 via TRANSDERMAL
  Filled 2022-07-29 (×20): qty 1

## 2022-07-29 MED ORDER — FENTANYL CITRATE PF 50 MCG/ML IJ SOSY
50.0000 ug | PREFILLED_SYRINGE | Freq: Once | INTRAMUSCULAR | Status: AC
  Administered 2022-07-29: 50 ug via INTRAVENOUS
  Filled 2022-07-29: qty 1

## 2022-07-29 MED ORDER — CYCLOBENZAPRINE HCL 10 MG PO TABS
10.0000 mg | ORAL_TABLET | Freq: Two times a day (BID) | ORAL | Status: DC | PRN
  Administered 2022-07-30 – 2022-08-17 (×11): 10 mg via ORAL
  Filled 2022-07-29 (×11): qty 1

## 2022-07-29 MED ORDER — FENTANYL CITRATE PF 50 MCG/ML IJ SOSY
100.0000 ug | PREFILLED_SYRINGE | Freq: Once | INTRAMUSCULAR | Status: AC
  Administered 2022-07-29: 100 ug via INTRAVENOUS
  Filled 2022-07-29: qty 2

## 2022-07-29 MED ORDER — ATORVASTATIN CALCIUM 40 MG PO TABS
40.0000 mg | ORAL_TABLET | Freq: Every day | ORAL | Status: DC
  Administered 2022-07-29 – 2022-08-17 (×20): 40 mg via ORAL
  Filled 2022-07-29 (×20): qty 1

## 2022-07-29 MED ORDER — METFORMIN HCL 500 MG PO TABS: 1000.0000 mg | ORAL_TABLET | Freq: Two times a day (BID) | ORAL | Status: DC

## 2022-07-29 MED ORDER — POTASSIUM CHLORIDE 10 MEQ/100ML IV SOLN
10.0000 meq | INTRAVENOUS | Status: AC
  Administered 2022-07-29 (×2): 10 meq via INTRAVENOUS
  Filled 2022-07-29 (×2): qty 100

## 2022-07-29 MED ORDER — ONDANSETRON HCL 4 MG/2ML IJ SOLN: 4.0000 mg | Freq: Four times a day (QID) | INTRAMUSCULAR | Status: DC | PRN

## 2022-07-29 MED ORDER — HYDROMORPHONE HCL 1 MG/ML IJ SOLN
0.5000 mg | INTRAMUSCULAR | Status: DC | PRN
  Administered 2022-07-29 – 2022-08-04 (×26): 0.5 mg via INTRAVENOUS
  Filled 2022-07-29 (×4): qty 0.5
  Filled 2022-07-29: qty 1
  Filled 2022-07-29 (×3): qty 0.5
  Filled 2022-07-29: qty 1
  Filled 2022-07-29 (×18): qty 0.5
  Filled 2022-07-29: qty 1

## 2022-07-29 MED ORDER — FENTANYL CITRATE PF 50 MCG/ML IJ SOSY
50.0000 ug | PREFILLED_SYRINGE | INTRAMUSCULAR | Status: DC | PRN
  Administered 2022-07-29: 50 ug via INTRAVENOUS
  Filled 2022-07-29: qty 1

## 2022-07-29 MED ORDER — MAGNESIUM SULFATE 4 GM/100ML IV SOLN
4.0000 g | Freq: Once | INTRAVENOUS | Status: AC
  Administered 2022-07-29: 4 g via INTRAVENOUS
  Filled 2022-07-29: qty 100

## 2022-07-29 MED ORDER — GADOBUTROL 1 MMOL/ML IV SOLN
6.5000 mL | Freq: Once | INTRAVENOUS | Status: AC | PRN
  Administered 2022-07-29: 6.5 mL via INTRAVENOUS

## 2022-07-29 MED ORDER — KETOROLAC TROMETHAMINE 15 MG/ML IJ SOLN
15.0000 mg | Freq: Four times a day (QID) | INTRAMUSCULAR | Status: AC
  Administered 2022-07-29 – 2022-08-03 (×19): 15 mg via INTRAVENOUS
  Filled 2022-07-29 (×19): qty 1

## 2022-07-29 MED ORDER — NALOXONE HCL 0.4 MG/ML IJ SOLN: 0.4000 mg | INTRAMUSCULAR | Status: DC | PRN

## 2022-07-29 NOTE — Progress Notes (Signed)
  Radiation Oncology         [phone] ________________________________  Name: Samuel [name] MRN: [id number]  Date: 07/28/2022  DOB: [birth year]  Chart Note:  Greatly appreciate [facility] for the inpatient care of this patient.  He presented with back pain 4 months ago and has been receiving fragmented and incomplete care as an outpatient due to his social and financial circumstances.  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  The patient needs CT biopsy of L2 ideally coupled with radiofrequency ablation and kyphoplasty as an inpatient.  This will likely be followed by radiotherapy, pending biopsy results.  It is critical during the current admission that we don't simply increase pain meds and discharge the patient without addressing his disease.   ________________________________  Sheral Apley. Tammi Klippel, M.D.

## 2022-07-29 NOTE — H&P (Addendum)
History and Physical    Patient: Samuel [name] QUI:[id number] DOB: Mar 14, 1966 DOA: 07/28/2022 DOS: the patient was seen and examined on 07/29/2022 PCP: [place], [name], MD  Patient coming from: Home with sister  Chief Complaint:  Chief Complaint  Patient presents with   Back Pain   HPI: Samuel [name] is a 56 y.o. male with medical history significant of diabetes mellitus on insulin, anxiety and depression, and diabetic neuropathy.  Patient was in the process of undergoing malignancy work-up.  He was found to have back pain 4 months ago and has been receiving fragmented and incomplete care due to his social and financial circumstances.  He is currently being followed by Dr. Tyler Pita radiation oncology.  He presented to the hospital overnight with complaints of intractable pain despite use of muscle relaxers and oxycodone in the outpatient setting.  An outpatient PET scan as well as lumbar biopsy and potential kyphoplasty were planned.  Noted patient reports intractable pain, radiculopathy the type pain going into right hip and down into right leg.  He is able to walk but after he starts to walk he takes about 4 steps and is no longer able to weight-bear.  He is able to move feet on exam and lift legs from knees supine in bed.In regards to pain although patient was prescribed 5 mg of oxycodone every 6 hours this was not adequate and he had increased to 10 mg every 6 hours as needed but again was having intractable pain.  He also states that he had run out of his gabapentin.  Flexeril was not helping.  Tylenol was not helping.  Since arrival to the ER he has had some improvement in his pain with administration of IV Dilaudid and fentanyl.  Patient has lost a significant amount of weight since April noting at that time his weight was 172 pounds and now his weight is 127 pounds.  He states he cannot eat because of the pain.  He has difficulty sleeping because of the pain.  His abdomen is flat  and he states he has not been having a bowel movement likely because he has not been ingesting enough food.  He was normotensive and afebrile and slightly tachycardic.  He was not hypoxic.  Glucoses have ranged in the mid to high 200s to just above 300.  Magnesium low at 1.5.  Albumin low at 2.8.  Renal function normal with a normal BUN, CRP normal.  White count normal.  Urinalysis abnormal with glucose greater than 500 and ketones of 80 with an elevated specific gravity of 1.036.  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   Review of Systems: As mentioned in the history of present illness. All other systems reviewed and are negative. Past Medical History:  Diagnosis Date   Diabetes mellitus without complication ([place])    Pancreatitis unk   History reviewed. No pertinent surgical history. Social History:  reports that he has been smoking cigarettes. He has been smoking an average of 1 pack per day. He has never used smokeless tobacco. He reports that he does not currently use alcohol after a past usage of about 50.0 standard drinks of alcohol per week. He reports that he does not use drugs.  Allergies  Allergen Reactions   Penicillins Hives   Percocet [Oxycodone-Acetaminophen] Nausea  And Vomiting    No family history on file.  Prior to Admission medications   Medication Sig Start Date End Date Taking? Authorizing Provider  aspirin 81 MG EC tablet Take 1 tablet (81 mg total) by mouth daily. Swallow whole. 03/26/21   Nwoko, Terese Door, PA  atorvastatin (LIPITOR) 40 MG tablet Take 1 tablet (40 mg total) by mouth daily. 06/16/21   Charlott Rakes, MD  Blood Glucose Monitoring Suppl (TRUE METRIX METER) w/Device KIT Check blood sugar 3 time daily. 06/16/21   Charlott Rakes,  MD  cyclobenzaprine (FLEXERIL) 10 MG tablet Take 1 tablet (10 mg total) by mouth 2 (two) times daily as needed for up to 20 doses for muscle spasms. 07/17/22   Wyvonnia Dusky, MD  escitalopram (LEXAPRO) 20 MG tablet Take 1 tablet (20 mg total) by mouth daily. 06/16/21   Charlott Rakes, MD  gabapentin (NEURONTIN) 300 MG capsule Take 1 capsule (300 mg total) by mouth 3 (three) times daily. 06/16/21   Charlott Rakes, MD  glucose blood (TRUE METRIX BLOOD GLUCOSE TEST) test strip Check blood sugar 3 times daily. 06/16/21   Charlott Rakes, MD  hydrOXYzine (ATARAX/VISTARIL) 25 MG tablet TAKE 1 TABLET (25 MG TOTAL) BY MOUTH EVERY 6 (SIX) HOURS AS NEEDED. 04/21/21   Dara Hoyer, PA-C  insulin glargine (LANTUS) 100 UNIT/ML Solostar Pen Inject 32 Units into the skin daily. 06/16/21 09/18/21  Charlott Rakes, MD  metFORMIN (GLUCOPHAGE) 1000 MG tablet Take 1 tablet (1,000 mg total) by mouth 2 (two) times daily. 06/16/21   Charlott Rakes, MD  methylPREDNISolone (MEDROL DOSEPAK) 4 MG TBPK tablet Take as directed on package 07/17/22   Wyvonnia Dusky, MD  Multiple Vitamin (MULTIVITAMIN WITH MINERALS) TABS tablet Take 1 tablet by mouth daily. 03/22/20   Samuella Cota, MD  oxyCODONE (ROXICODONE) 5 MG immediate release tablet Take 1 tablet (5 mg total) by mouth every 6 (six) hours as needed for up to 60 doses (For Cancer Pain in the Spine). 07/22/22   Tyler Pita, MD  pantoprazole (PROTONIX) 40 MG tablet Take 1 tablet (40 mg total) by mouth daily. 06/16/21   Charlott Rakes, MD  sildenafil (VIAGRA) 25 MG tablet Take 1 tablet (25 mg total) 1/2 hour to 1 hour prior to intercourse as needed. Limit use to 1/2 tablet or 1 tablet per 24 hours. 05/18/21   Camillia Herter, NP  traZODone (DESYREL) 50 MG tablet Take 1 tablet (50 mg total) by mouth at bedtime as needed for sleep. 06/16/21   Charlott Rakes, MD  TRUEplus Lancets 28G MISC Check blood sugar 3 times daily. 06/16/21   Charlott Rakes, MD    Physical  Exam: Vitals:   07/29/22 0515 07/29/22 0530 07/29/22 0600 07/29/22 0609  BP: 116/69 120/72 109/73   Pulse:  88 98   Resp:  16 16   Temp:    97.9 F (36.6 C)  TempSrc:    Oral  SpO2:  100% 100%   Weight:      Height:       Constitutional: NAD, calm, uncomfortable Eyes: PERRL, lids and conjunctivae normal ENMT: Mucous membranes are dry. Posterior pharynx clear of any exudate or lesions.  Poor dentition with multiple missing teeth Neck: normal, supple, no masses, no thyromegaly Respiratory: clear to auscultation bilaterally, no wheezing, no crackles. Normal respiratory effort. No accessory muscle use.  Cardiovascular: Regular rate and rhythm, no murmurs / rubs / gallops. No extremity edema. 2+ pedal pulses. No carotid bruits.  Abdomen: no tenderness,  flat and nondistended, no masses palpated. No hepatosplenomegaly. Bowel sounds positive.  Musculoskeletal: no clubbing / cyanosis. No joint deformity upper and lower extremities. Good ROM, no contractures. Normal muscle tone.  Skin: no rashes, lesions, ulcers. No induration Neurologic: CN 2-12 grossly intact. Sensation intact, DTR normal slightly diminished on the right. Strength 5/5 on both upper extremities and LLE.  RLE weak at 4/5.  Slightly diminished DTR.  Sensation intact all 4 extremities Psychiatric: Normal judgment and insight. Alert and oriented x 3. Normal mood.   Data Reviewed:  See above in HPI  Assessment and Plan: Intractable lumbar pain secondary to metastatic disease of uncertain etiology Patient reports right lower extremity radiculopathy symptoms with some superficial numbness of right thigh region. Appreciate Dr. Tammi Klippel assistance.  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  Agree with need for CT biopsy of L2 with radiofrequency ablation and kyphoplasty.  Patient may need to transfer to [facility]  especially if previously ordered PET scan needs to be accomplished to assist with diagnosis and treatment. I have discontinued short acting oxycodone in favor of MS Contin 15 mg twice daily along with Dilaudid 0.5 mg IV every 2 hours for breakthrough pain Have added a lidocaine patch and will also add IV Toradol but will need to follow renal function May benefit from palliative care consultation to assist with pain management and once source of malignancy identified may benefit from goals of care conversation Check TSH Common sources for spinal metastasis are lung, breast, prostate, melanoma, kidney and thyroid.  Patient had recent PSA that was normal.  Chest x-ray normal.  Breast exam unremarkable. Begin bowel prophylaxis with Colace and MiraLAX  PT eval/tx  Severe protein calorie malnutrition suspected due to malignancy Patient states in April that his weight was 172 pounds and reports that he believes his weight has decreased to 127 pounds. His laboratory data reveals a slightly elevated anion gap and low CO2 probably from hyperglycemia noting glucose is 314 but there is probably a component of starvation ketosis as well Based on reported weight of 127 pounds BMI would be 19.3 which has decreased from a previous BMI of 20.98 We will consult nutrition Begin liquid protein supplements with Glucerna since patient is diabetic  Diabetes mellitus on insulin poorly controlled/hyperglycemia Current hemoglobin A1c 13, resented with significant glycosuria and ketones in urine with mild elevation in anion gap Continue Lantus Receive IV contrast so will hold metformin for now  Hypomagnesemia Mg+ 1.5 Give 4 g IV and repeat labs in a.m.  Depression and anxiety Continue Lexapro and trazodone  HLD Continue Lipitor noting that LFTs are normal    Advance Care Planning:   Code Status: Full Code   Consults: Radiation oncology  DVT prophylaxis: SCDs for now.  Will eventually need to transition to  pharmacological DVT prophylaxis after L2 biopsy  Family Communication: Patient only-livess with his sister  Severity of Illness: The appropriate patient status for this patient is OBSERVATION. Observation status is judged to be reasonable and necessary in order to provide the required intensity of service to ensure the patient's safety. The patient's presenting symptoms, physical exam findings, and initial radiographic and laboratory data in the context of their medical condition is felt to place them at decreased risk for further clinical deterioration. Furthermore, it is anticipated that the patient will be medically stable for discharge from the hospital within 2 midnights of admission.  Author: Erin Hearing, NP 07/29/2022 7:04 AM  For on call review [email or web address].

## 2022-07-29 NOTE — ED Provider Notes (Signed)
[facility] Provider Note   CSN: [id number] Arrival date & time: 07/28/22  1846     History  Chief Complaint  Patient presents with   Back Pain    Samuel [name] is a 56 y.o. male.   Back Pain  Patient reports continued back pain. Patient has had low back pain for several months, and recently found to have a pathologic fracture in the lumbar spine, and patient reports it is unknown where the original cancerous lesion is located He reports over the past several days he has had increasing back pain and leg weakness and numbness.  He reports it is difficult to walk.  He reports he fell yesterday but did not sustain any injury.  He is able to ambulate.  No new urinary or fecal incontinence.  No fevers or vomiting.  No significant headache or neck pain.  Patient reports wearing a back brace recently and now has bruising to his back from the device  Home Medications Prior to Admission medications   Medication Sig Start Date End Date Taking? Authorizing Provider  aspirin 81 MG EC tablet Take 1 tablet (81 mg total) by mouth daily. Swallow whole. 03/26/21   Nwoko, Terese Door, PA  atorvastatin (LIPITOR) 40 MG tablet Take 1 tablet (40 mg total) by mouth daily. 06/16/21   Charlott Rakes, MD  Blood Glucose Monitoring Suppl (TRUE METRIX METER) w/Device KIT Check blood sugar 3 time daily. 06/16/21   Charlott Rakes, MD  cyclobenzaprine (FLEXERIL) 10 MG tablet Take 1 tablet (10 mg total) by mouth 2 (two) times daily as needed for up to 20 doses for muscle spasms. 07/17/22   Wyvonnia Dusky, MD  escitalopram (LEXAPRO) 20 MG tablet Take 1 tablet (20 mg total) by mouth daily. 06/16/21   Charlott Rakes, MD  gabapentin (NEURONTIN) 300 MG capsule Take 1 capsule (300 mg total) by mouth 3 (three) times daily. 06/16/21   Charlott Rakes, MD  glucose blood (TRUE METRIX BLOOD GLUCOSE TEST) test strip Check blood sugar 3 times daily. 06/16/21   Charlott Rakes, MD   hydrOXYzine (ATARAX/VISTARIL) 25 MG tablet TAKE 1 TABLET (25 MG TOTAL) BY MOUTH EVERY 6 (SIX) HOURS AS NEEDED. 04/21/21   Dara Hoyer, PA-C  insulin glargine (LANTUS) 100 UNIT/ML Solostar Pen Inject 32 Units into the skin daily. 06/16/21 09/18/21  Charlott Rakes, MD  metFORMIN (GLUCOPHAGE) 1000 MG tablet Take 1 tablet (1,000 mg total) by mouth 2 (two) times daily. 06/16/21   Charlott Rakes, MD  methylPREDNISolone (MEDROL DOSEPAK) 4 MG TBPK tablet Take as directed on package 07/17/22   Wyvonnia Dusky, MD  Multiple Vitamin (MULTIVITAMIN WITH MINERALS) TABS tablet Take 1 tablet by mouth daily. 03/22/20   Samuella Cota, MD  oxyCODONE (ROXICODONE) 5 MG immediate release tablet Take 1 tablet (5 mg total) by mouth every 6 (six) hours as needed for up to 60 doses (For Cancer Pain in the Spine). 07/22/22   Tyler Pita, MD  pantoprazole (PROTONIX) 40 MG tablet Take 1 tablet (40 mg total) by mouth daily. 06/16/21   Charlott Rakes, MD  sildenafil (VIAGRA) 25 MG tablet Take 1 tablet (25 mg total) 1/2 hour to 1 hour prior to intercourse as needed. Limit use to 1/2 tablet or 1 tablet per 24 hours. 05/18/21   Camillia Herter, NP  traZODone (DESYREL) 50 MG tablet Take 1 tablet (50 mg total) by mouth at bedtime as needed for sleep. 06/16/21   Charlott Rakes, MD  TRUEplus Lancets 28G MISC Check blood sugar 3 times daily. 06/16/21   Charlott Rakes, MD      Allergies    Penicillins and Percocet [oxycodone-acetaminophen]    Review of Systems   Review of Systems  Musculoskeletal:  Positive for back pain.    Physical Exam Updated Vital Signs BP 117/73   Pulse 94   Temp 98 F (36.7 C) (Oral)   Resp 18   Ht 1.727 m ('5\' 8"' )   Wt 62.6 kg   SpO2 100%   BMI 20.98 kg/m  Physical Exam CONSTITUTIONAL: Ill, cachectic HEAD: Normocephalic/atraumatic EYES: EOMI/PERRL ENMT: Mucous membranes moist NECK: supple no meningeal signs SPINE/BACK: Diffuse tenderness to lumbar spine.  Bruising noted to his  back CV: S1/S2 noted, no murmurs/rubs/gallops noted LUNGS: Lungs are clear to auscultation bilaterally, no apparent distress ABDOMEN: soft, nontender GU:no cva tenderness NEURO: Pt is awake/alert/appropriate, moves all extremitiesx4.  No facial droop.  Limitations of range of motion of bilateral hips due to pain but he is able to flex both hips and flex and extend both knees and ankles.  He reports numbness in bilateral thighs EXTREMITIES: pulses normal/equal, full ROM, no deformities SKIN: warm, color normal PSYCH: mildly anxious  ED Results / Procedures / Treatments   Labs (all labs ordered are listed, but only abnormal results are displayed) Labs Reviewed  BASIC METABOLIC PANEL - Abnormal; Notable for the following components:      Result Value   CO2 19 (*)    Glucose, Bld 314 (*)    Anion gap 17 (*)    All other components within normal limits  SEDIMENTATION RATE - Abnormal; Notable for the following components:   Sed Rate 20 (*)    All other components within normal limits  URINALYSIS, ROUTINE W REFLEX MICROSCOPIC - Abnormal; Notable for the following components:   Specific Gravity, Urine 1.036 (*)    Glucose, UA >=500 (*)    Ketones, ur 80 (*)    All other components within normal limits  CBC WITH DIFFERENTIAL/PLATELET  C-REACTIVE PROTEIN    EKG None  Radiology MR Lumbar Spine W Wo Contrast  Result Date: 07/29/2022 CLINICAL DATA:  Worsening low back pain EXAM: MRI THORACIC AND LUMBAR SPINE WITHOUT AND WITH CONTRAST TECHNIQUE: Multiplanar and multiecho pulse sequences of the thoracic and lumbar spine were obtained without and with intravenous contrast. CONTRAST:  6.43m GADAVIST GADOBUTROL 1 MMOL/ML IV SOLN COMPARISON:  MRI lumbar spine 04/04/2022 FINDINGS: MRI THORACIC SPINE FINDINGS Alignment:  No significant listhesis. Vertebrae: Focal area of T1 hypointense and T2 hyperintense signal with contrast enhancement in the posteroinferior aspect of T9 (series 25, image 7) is  concerning for osseous metastatic disease. No acute fracture. Cord:  Normal signal and morphology.  No abnormal enhancement. Paraspinal and other soft tissues: Negative. Disc levels: No spinal canal stenosis or significant neural foraminal narrowing. MRI LUMBAR SPINE FINDINGS Segmentation:  Standard. Alignment: Straightening of the normal lumbar lordosis. No significant listhesis. Vertebrae: Decreased T1 signal, increased T2 signal and abnormal enhancement in the L2 vertebral body and right-greater-than-left posterior elements (series 6, images 6, 8, and 11), with pathologic compression fracture that has progressed compared to 04/04/2022, with approximately 40% vertebral body height loss and 7 mm of retropulsion. Additional abnormal signal is noted in the right posterosuperior aspect of L3, extending into the right pedicle and proximal spinous process (series 6, images 5-8); L4 posterior elements (series 6, images 4-13); and inferoposterior left aspect of L5 (series 6, image 10). Vertebral body heights  are otherwise preserved. Conus medullaris: Extends to the L1 level and appears normal. No abnormal enhancement. Paraspinal and other soft tissues: Increased T2 signal in the left paraspinous musculature at L4, which may be inflammatory. Disc levels: T12-L1: No significant disc bulge. No spinal canal stenosis or neural foraminal narrowing. L1-L2: No significant disc bulge. No spinal canal stenosis at the disc space. No neural foraminal narrowing. Posterior to the L1 vertebral body, there is moderate spinal canal stenosis secondary to retropulsion. This likely compresses the descending right-greater-than-left L2 nerve roots. L2-L3: No significant disc bulge. No spinal canal stenosis. Mild left and moderate right neural foraminal narrowing, secondary to the vertebral body fracture and epidural extension of tumor. L3-L4: No significant disc bulge. No spinal canal stenosis or neural foraminal narrowing. L4-L5: Mild disc  bulge. Narrowing of the left lateral recess, which could affect the descending left L5 nerve roots. Mild-to-moderate left neural foraminal narrowing. L5-S1: Disc height loss with right paracentral, subarticular, and foraminal disc protrusion, which narrows the right lateral recess, likely affecting the descending right S1 nerve roots. No spinal canal stenosis. Mild right-greater-than-left neural foraminal narrowing. IMPRESSION: 1. Progression of the previously noted pathologic fracture at L2, with approximately 40% vertebral body height loss and 7 mm retropulsion causes moderate spinal canal stenosis posterior to the L2 vertebral body. This likely compresses the descending right-greater-than-left L2 nerve roots. This also causes moderate right and mild left neural foraminal narrowing at L2-L3. 2. Abnormal signal throughout the L2 vertebral body and right-greater-than-left posterior elements, most likely representing metastatic disease with myeloma or lymphoma felt to be less likely. Additional osseous lesions in T9, L3, L4 and L5. No additional pathologic fracture. 3. Increased T2 signal in the left paraspinous musculature at L4, which may be inflammatory. 4. No additional spinal canal stenosis. L4-L5 mild-to-moderate left neural foraminal narrowing and L5-S1 mild bilateral neural foraminal narrowing. Electronically Signed   By: Merilyn Baba M.D.   On: 07/29/2022 02:35   MR THORACIC SPINE W WO CONTRAST  Result Date: 07/29/2022 CLINICAL DATA:  Worsening low back pain EXAM: MRI THORACIC AND LUMBAR SPINE WITHOUT AND WITH CONTRAST TECHNIQUE: Multiplanar and multiecho pulse sequences of the thoracic and lumbar spine were obtained without and with intravenous contrast. CONTRAST:  6.68m GADAVIST GADOBUTROL 1 MMOL/ML IV SOLN COMPARISON:  MRI lumbar spine 04/04/2022 FINDINGS: MRI THORACIC SPINE FINDINGS Alignment:  No significant listhesis. Vertebrae: Focal area of T1 hypointense and T2 hyperintense signal with contrast  enhancement in the posteroinferior aspect of T9 (series 25, image 7) is concerning for osseous metastatic disease. No acute fracture. Cord:  Normal signal and morphology.  No abnormal enhancement. Paraspinal and other soft tissues: Negative. Disc levels: No spinal canal stenosis or significant neural foraminal narrowing. MRI LUMBAR SPINE FINDINGS Segmentation:  Standard. Alignment: Straightening of the normal lumbar lordosis. No significant listhesis. Vertebrae: Decreased T1 signal, increased T2 signal and abnormal enhancement in the L2 vertebral body and right-greater-than-left posterior elements (series 6, images 6, 8, and 11), with pathologic compression fracture that has progressed compared to 04/04/2022, with approximately 40% vertebral body height loss and 7 mm of retropulsion. Additional abnormal signal is noted in the right posterosuperior aspect of L3, extending into the right pedicle and proximal spinous process (series 6, images 5-8); L4 posterior elements (series 6, images 4-13); and inferoposterior left aspect of L5 (series 6, image 10). Vertebral body heights are otherwise preserved. Conus medullaris: Extends to the L1 level and appears normal. No abnormal enhancement. Paraspinal and other soft tissues: Increased T2  signal in the left paraspinous musculature at L4, which may be inflammatory. Disc levels: T12-L1: No significant disc bulge. No spinal canal stenosis or neural foraminal narrowing. L1-L2: No significant disc bulge. No spinal canal stenosis at the disc space. No neural foraminal narrowing. Posterior to the L1 vertebral body, there is moderate spinal canal stenosis secondary to retropulsion. This likely compresses the descending right-greater-than-left L2 nerve roots. L2-L3: No significant disc bulge. No spinal canal stenosis. Mild left and moderate right neural foraminal narrowing, secondary to the vertebral body fracture and epidural extension of tumor. L3-L4: No significant disc bulge. No  spinal canal stenosis or neural foraminal narrowing. L4-L5: Mild disc bulge. Narrowing of the left lateral recess, which could affect the descending left L5 nerve roots. Mild-to-moderate left neural foraminal narrowing. L5-S1: Disc height loss with right paracentral, subarticular, and foraminal disc protrusion, which narrows the right lateral recess, likely affecting the descending right S1 nerve roots. No spinal canal stenosis. Mild right-greater-than-left neural foraminal narrowing. IMPRESSION: 1. Progression of the previously noted pathologic fracture at L2, with approximately 40% vertebral body height loss and 7 mm retropulsion causes moderate spinal canal stenosis posterior to the L2 vertebral body. This likely compresses the descending right-greater-than-left L2 nerve roots. This also causes moderate right and mild left neural foraminal narrowing at L2-L3. 2. Abnormal signal throughout the L2 vertebral body and right-greater-than-left posterior elements, most likely representing metastatic disease with myeloma or lymphoma felt to be less likely. Additional osseous lesions in T9, L3, L4 and L5. No additional pathologic fracture. 3. Increased T2 signal in the left paraspinous musculature at L4, which may be inflammatory. 4. No additional spinal canal stenosis. L4-L5 mild-to-moderate left neural foraminal narrowing and L5-S1 mild bilateral neural foraminal narrowing. Electronically Signed   By: Merilyn Baba M.D.   On: 07/29/2022 02:35   DG Chest 2 View  Result Date: 07/28/2022 CLINICAL DATA:  Weakness EXAM: CHEST - 2 VIEW COMPARISON:  03/09/2021 FINDINGS: The heart size and mediastinal contours are within normal limits. Aortic atherosclerosis. Both lungs are clear. Multiple old right-sided rib fractures. 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 IMPRESSION: No active cardiopulmonary disease. Electronically Signed   By: Donavan Foil M.D.   On: 07/28/2022 20:35    Procedures Procedures    Medications Ordered  in ED Medications  sodium chloride 0.9 % bolus 1,000 mL (has no administration in time range)  fentaNYL (SUBLIMAZE) injection 100 mcg (100 mcg Intravenous Given 07/29/22 0054)  sodium chloride 0.9 % bolus 1,000 mL (1,000 mLs Intravenous New Bag/Given 07/29/22 0058)  gadobutrol (GADAVIST) 1 MMOL/ML injection 6.5 mL (6.5 mLs Intravenous Contrast Given 07/29/22 0147)  fentaNYL (SUBLIMAZE) injection 50 mcg (50 mcg Intravenous Given 07/29/22 0243)    ED Course/ Medical Decision Making/ A&P Clinical Course as of 07/29/22 0329  Fri Jul 29, 2022  0016 Glucose(!): 314 Hyperglycemia [DW]  0016 Ketones, ur(!): 80 Ketonuria noted [DW]  0055 Patient presents with increasing back pain, difficulty walking or leg weakness and numbness.  Patient with history of spinal metastasis of unknown primary.  He has had MRI several months ago, will obtain stat MRI thoracic and lumbar spine.  Patient is also clearly dehydrated, will give IV fluids [DW]  0228 Patient reporting continued back pain.  Will give another round of medicine while awaiting MRI results [DW]  0302 Discussed with neurosurgery, will review MRI and call back [DW]  0305 D/w PA Meyran with NSGY.  We discussed MRI results and his history.  Would recommend continued TLSO brace, steroids  as needed, but no other acute intervention. [DW]  5750 Patient reporting continued back pain.  Pain is uncontrolled.  Plan will be to admit for pain control, hydration and monitoring.  Patient will need to have PET scan rescheduled [DW]    Clinical Course User Index [DW] Ripley Fraise, MD                           Medical Decision Making Amount and/or Complexity of Data Reviewed Labs:  Decision-making details documented in ED Course.  Risk Prescription drug management. Decision regarding hospitalization.   This patient presents to the ED for concern of back pain, this involves an extensive number of treatment options, and is a complaint that carries with it a high  risk of complications and morbidity.  The differential diagnosis includes but is not limited to cauda equina syndrome, discitis, epidural abscess, muscle strain, compression fracture  Comorbidities that complicate the patient evaluation: Patient's presentation is complicated by their history of spinal metastasis of unknown primary  Social Determinants of Health: Patient's  recently unhoused   increases the complexity of managing their presentation  Additional history obtained: Additional history obtained from family Records reviewed  oncology notes reviewed  Lab Tests: I Ordered, and personally interpreted labs.  The pertinent results include: Dehydration hyperglycemia  Imaging Studies ordered: I ordered imaging studies including X-ray chest x-ray   I independently visualized and interpreted imaging which showed no acute findings I agree with the radiologist interpretation  MRI thoracic and lumbar spine shows worsened pathologic fracture and retropulsion  Medicines ordered and prescription drug management: I ordered medication including fentanyl for pain Reevaluation of the patient after these medicines showed that the patient    improved    Consultations Obtained: I requested consultation with the admitting physician dr Velia Meyer , and discussed  findings as well as pertinent plan - they recommend: admission Discussed with neurosurgery, recommend pain control, no surgical intervention  Reevaluation: After the interventions noted above, I reevaluated the patient and found that they have :improved  Complexity of problems addressed: Patient's presentation is most consistent with  acute presentation with potential threat to life or bodily function  Disposition: After consideration of the diagnostic results and the patient's response to treatment,  I feel that the patent would benefit from admission   .           Final Clinical Impression(s) / ED Diagnoses Final  diagnoses:  Dehydration  Intractable back pain  Pathological fracture of vertebra due to neoplastic disease, sequela  Hyperglycemia    Rx / DC Orders ED Discharge Orders     None         Ripley Fraise, MD 07/29/22 [phone]

## 2022-07-29 NOTE — Consult Note (Signed)
Chief Complaint: Patient was seen in consultation today for back pain, L2 lesion  Referring Physician(s): Dr. Tyler Pita  Supervising Physician: Katherina Right Lyla Glassing  Patient Status: [facility] - ED  History of Present Illness: Samuel [name] is a 56 y.o. male with a past medical history significant for pancreatitis and diabetes mellitus. Patient reports back pain and weight loss for approximately 4 months.  He presented to ED in May due to back pain and a CT of the abdomen showed a lytic L2 vertebral body lesion as well as an L4 spinous process lesion without intra-abdominal malignancy.  A chest CT performed June 28, 2022 showed no definitive evidence of malignancy.  Due to worsening back pain, patient again presented to ED on 07/17/2022.  CT of the lumbar spine showed worsening presumed pathologic fracture of the L2 vertebral body with small retropulsion, extension into the posterior elements and more than 50% height loss, new lytic lesion in the L3 right pedicle and progression of the lytic lesion in the L4 spinous process, now involving the lamina bilaterally.   Patient is now again readmitted with pain and progressive weakness.  He states that "my legs went out from under me yesterday when I was at the sink."  He was scheduled for PET scan today, however due to weakness and pain presented to the ED. He has experienced progressive weight loss stating that he was 172 lbs in April of this year.  IR consulted for biopsy and treatment of his L2, L3 lumbar lesions.   Patient assessed alongside Dr. Karenann Cai who is familiar with the patient from consultation 07/22/22.  We will proceed with work-up for possible biopsy, radiofrequency (Osteocool) ablation, and vertebroplasty/kyphoplasty at L2, L3.   Patient did eat breakfast this AM.  He does not take blood thinners.   Past Medical History:  Diagnosis Date   Diabetes mellitus without complication (Derma)    Pancreatitis  unk    History reviewed. No pertinent surgical history.  Allergies: Penicillins and Percocet [oxycodone-acetaminophen]  Medications: Prior to Admission medications   Medication Sig Start Date End Date Taking? Authorizing Provider  acetaminophen (TYLENOL) 500 MG tablet Take 1,000 mg by mouth every 6 (six) hours as needed for mild pain or moderate pain.   Yes [provider]  oxyCODONE (ROXICODONE) 5 MG immediate release tablet Take 1 tablet (5 mg total) by mouth every 6 (six) hours as needed for up to 60 doses (For Cancer Pain in the Spine). 07/22/22  Yes Tyler Pita, MD  cyclobenzaprine (FLEXERIL) 10 MG tablet Take 1 tablet (10 mg total) by mouth 2 (two) times daily as needed for up to 20 doses for muscle spasms. Patient not taking: Reported on 07/29/2022 07/17/22   Wyvonnia Dusky, MD  methylPREDNISolone (MEDROL DOSEPAK) 4 MG TBPK tablet Take as directed on package Patient not taking: Reported on 07/29/2022 07/17/22   Wyvonnia Dusky, MD     History reviewed. No pertinent family history.  Social History   Socioeconomic History   Marital status: Single    Spouse name: Not on file   Number of children: Not on file   Years of education: Not on file   Highest education level: Not on file  Occupational History   Not on file  Tobacco Use   Smoking status: Every Day    Packs/day: 1.00    Types: Cigarettes   Smokeless tobacco: Never  Substance and Sexual Activity   Alcohol use: Not Currently  Alcohol/week: 50.0 standard drinks of alcohol    Types: 50 Cans of beer per week    Comment: last thursday - a couple of beers   Drug use: No   Sexual activity: Yes  Other Topics Concern   Not on file  Social History Narrative   Not on file   Social Determinants of Health   Financial Resource Strain: Not on file  Food Insecurity: Not on file  Transportation Needs: Not on file  Physical Activity: Not on file  Stress: Not on file  Social Connections: Not on file      Review of Systems: A 12 point ROS discussed and pertinent positives are indicated in the HPI above.  All other systems are negative.  Review of Systems  Constitutional:  Negative for fatigue and fever.  Respiratory:  Negative for cough and shortness of breath.   Cardiovascular:  Negative for chest pain.  Gastrointestinal:  Negative for abdominal pain, nausea and vomiting.  Musculoskeletal:  Negative for back pain.  Psychiatric/Behavioral:  Negative for behavioral problems and confusion.     Vital Signs: BP 121/76   Pulse 94   Temp 97.8 F (36.6 C) (Oral)   Resp (!) 22   Ht 5\' 8"  (1.727 m)   Wt 138 lb (62.6 kg)   SpO2 100%   BMI 20.98 kg/m   Physical Exam Vitals and nursing note reviewed.  Constitutional:      General: He is not in acute distress.    Appearance: Normal appearance. He is not ill-appearing.  HENT:     Mouth/Throat:     Mouth: Mucous membranes are moist.     Pharynx: Oropharynx is clear.  Cardiovascular:     Rate and Rhythm: Normal rate and regular rhythm.  Pulmonary:     Effort: Pulmonary effort is normal. No respiratory distress.     Breath sounds: Normal breath sounds.  Skin:    General: Skin is warm and dry.  Neurological:     General: No focal deficit present.     Mental Status: He is alert and oriented to person, place, and time. Mental status is at baseline.  Psychiatric:        Mood and Affect: Mood normal.        Behavior: Behavior normal.        Thought Content: Thought content normal.        Judgment: Judgment normal.      MD Evaluation Airway: WNL Heart: WNL Abdomen: WNL Chest/ Lungs: WNL ASA  Classification: 3 Mallampati/Airway Score: Two   Imaging: MR Lumbar Spine W Wo Contrast  Result Date: 07/29/2022 CLINICAL DATA:  Worsening low back pain EXAM: MRI THORACIC AND LUMBAR SPINE WITHOUT AND WITH CONTRAST TECHNIQUE: Multiplanar and multiecho pulse sequences of the thoracic and lumbar spine were obtained without and with  intravenous contrast. CONTRAST:  6.57mL GADAVIST GADOBUTROL 1 MMOL/ML IV SOLN COMPARISON:  MRI lumbar spine 04/04/2022 FINDINGS: MRI THORACIC SPINE FINDINGS Alignment:  No significant listhesis. Vertebrae: Focal area of T1 hypointense and T2 hyperintense signal with contrast enhancement in the posteroinferior aspect of T9 (series 25, image 7) is concerning for osseous metastatic disease. No acute fracture. Cord:  Normal signal and morphology.  No abnormal enhancement. Paraspinal and other soft tissues: Negative. Disc levels: No spinal canal stenosis or significant neural foraminal narrowing. MRI LUMBAR SPINE FINDINGS Segmentation:  Standard. Alignment: Straightening of the normal lumbar lordosis. No significant listhesis. Vertebrae: Decreased T1 signal, increased T2 signal and abnormal enhancement in the L2  vertebral body and right-greater-than-left posterior elements (series 6, images 6, 8, and 11), with pathologic compression fracture that has progressed compared to 04/04/2022, with approximately 40% vertebral body height loss and 7 mm of retropulsion. Additional abnormal signal is noted in the right posterosuperior aspect of L3, extending into the right pedicle and proximal spinous process (series 6, images 5-8); L4 posterior elements (series 6, images 4-13); and inferoposterior left aspect of L5 (series 6, image 10). Vertebral body heights are otherwise preserved. Conus medullaris: Extends to the L1 level and appears normal. No abnormal enhancement. Paraspinal and other soft tissues: Increased T2 signal in the left paraspinous musculature at L4, which may be inflammatory. Disc levels: T12-L1: No significant disc bulge. No spinal canal stenosis or neural foraminal narrowing. L1-L2: No significant disc bulge. No spinal canal stenosis at the disc space. No neural foraminal narrowing. Posterior to the L1 vertebral body, there is moderate spinal canal stenosis secondary to retropulsion. This likely compresses the  descending right-greater-than-left L2 nerve roots. L2-L3: No significant disc bulge. No spinal canal stenosis. Mild left and moderate right neural foraminal narrowing, secondary to the vertebral body fracture and epidural extension of tumor. L3-L4: No significant disc bulge. No spinal canal stenosis or neural foraminal narrowing. L4-L5: Mild disc bulge. Narrowing of the left lateral recess, which could affect the descending left L5 nerve roots. Mild-to-moderate left neural foraminal narrowing. L5-S1: Disc height loss with right paracentral, subarticular, and foraminal disc protrusion, which narrows the right lateral recess, likely affecting the descending right S1 nerve roots. No spinal canal stenosis. Mild right-greater-than-left neural foraminal narrowing. IMPRESSION: 1. Progression of the previously noted pathologic fracture at L2, with approximately 40% vertebral body height loss and 7 mm retropulsion causes moderate spinal canal stenosis posterior to the L2 vertebral body. This likely compresses the descending right-greater-than-left L2 nerve roots. This also causes moderate right and mild left neural foraminal narrowing at L2-L3. 2. Abnormal signal throughout the L2 vertebral body and right-greater-than-left posterior elements, most likely representing metastatic disease with myeloma or lymphoma felt to be less likely. Additional osseous lesions in T9, L3, L4 and L5. No additional pathologic fracture. 3. Increased T2 signal in the left paraspinous musculature at L4, which may be inflammatory. 4. No additional spinal canal stenosis. L4-L5 mild-to-moderate left neural foraminal narrowing and L5-S1 mild bilateral neural foraminal narrowing. Electronically Signed   By: Merilyn Baba M.D.   On: 07/29/2022 02:35   MR THORACIC SPINE W WO CONTRAST  Result Date: 07/29/2022 CLINICAL DATA:  Worsening low back pain EXAM: MRI THORACIC AND LUMBAR SPINE WITHOUT AND WITH CONTRAST TECHNIQUE: Multiplanar and multiecho pulse  sequences of the thoracic and lumbar spine were obtained without and with intravenous contrast. CONTRAST:  6.25mL GADAVIST GADOBUTROL 1 MMOL/ML IV SOLN COMPARISON:  MRI lumbar spine 04/04/2022 FINDINGS: MRI THORACIC SPINE FINDINGS Alignment:  No significant listhesis. Vertebrae: Focal area of T1 hypointense and T2 hyperintense signal with contrast enhancement in the posteroinferior aspect of T9 (series 25, image 7) is concerning for osseous metastatic disease. No acute fracture. Cord:  Normal signal and morphology.  No abnormal enhancement. Paraspinal and other soft tissues: Negative. Disc levels: No spinal canal stenosis or significant neural foraminal narrowing. MRI LUMBAR SPINE FINDINGS Segmentation:  Standard. Alignment: Straightening of the normal lumbar lordosis. No significant listhesis. Vertebrae: Decreased T1 signal, increased T2 signal and abnormal enhancement in the L2 vertebral body and right-greater-than-left posterior elements (series 6, images 6, 8, and 11), with pathologic compression fracture that has progressed compared to 04/04/2022,  with approximately 40% vertebral body height loss and 7 mm of retropulsion. Additional abnormal signal is noted in the right posterosuperior aspect of L3, extending into the right pedicle and proximal spinous process (series 6, images 5-8); L4 posterior elements (series 6, images 4-13); and inferoposterior left aspect of L5 (series 6, image 10). Vertebral body heights are otherwise preserved. Conus medullaris: Extends to the L1 level and appears normal. No abnormal enhancement. Paraspinal and other soft tissues: Increased T2 signal in the left paraspinous musculature at L4, which may be inflammatory. Disc levels: T12-L1: No significant disc bulge. No spinal canal stenosis or neural foraminal narrowing. L1-L2: No significant disc bulge. No spinal canal stenosis at the disc space. No neural foraminal narrowing. Posterior to the L1 vertebral body, there is moderate spinal  canal stenosis secondary to retropulsion. This likely compresses the descending right-greater-than-left L2 nerve roots. L2-L3: No significant disc bulge. No spinal canal stenosis. Mild left and moderate right neural foraminal narrowing, secondary to the vertebral body fracture and epidural extension of tumor. L3-L4: No significant disc bulge. No spinal canal stenosis or neural foraminal narrowing. L4-L5: Mild disc bulge. Narrowing of the left lateral recess, which could affect the descending left L5 nerve roots. Mild-to-moderate left neural foraminal narrowing. L5-S1: Disc height loss with right paracentral, subarticular, and foraminal disc protrusion, which narrows the right lateral recess, likely affecting the descending right S1 nerve roots. No spinal canal stenosis. Mild right-greater-than-left neural foraminal narrowing. IMPRESSION: 1. Progression of the previously noted pathologic fracture at L2, with approximately 40% vertebral body height loss and 7 mm retropulsion causes moderate spinal canal stenosis posterior to the L2 vertebral body. This likely compresses the descending right-greater-than-left L2 nerve roots. This also causes moderate right and mild left neural foraminal narrowing at L2-L3. 2. Abnormal signal throughout the L2 vertebral body and right-greater-than-left posterior elements, most likely representing metastatic disease with myeloma or lymphoma felt to be less likely. Additional osseous lesions in T9, L3, L4 and L5. No additional pathologic fracture. 3. Increased T2 signal in the left paraspinous musculature at L4, which may be inflammatory. 4. No additional spinal canal stenosis. L4-L5 mild-to-moderate left neural foraminal narrowing and L5-S1 mild bilateral neural foraminal narrowing. Electronically Signed   By: Merilyn Baba M.D.   On: 07/29/2022 02:35   DG Chest 2 View  Result Date: 07/28/2022 CLINICAL DATA:  Weakness EXAM: CHEST - 2 VIEW COMPARISON:  03/09/2021 FINDINGS: The heart  size and mediastinal contours are within normal limits. Aortic atherosclerosis. Both lungs are clear. Multiple old right-sided rib fractures. 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 IMPRESSION: No active cardiopulmonary disease. Electronically Signed   By: Donavan Foil M.D.   On: 07/28/2022 20:35   CT Lumbar Spine Wo Contrast  Result Date: 07/17/2022 CLINICAL DATA:  Back pain.  Known osseous metastatic disease EXAM: CT LUMBAR SPINE WITHOUT CONTRAST TECHNIQUE: Multidetector CT imaging of the lumbar spine was performed without intravenous contrast administration. Multiplanar CT image reconstructions were also generated. RADIATION DOSE REDUCTION: This exam was performed according to the departmental dose-optimization program which includes automated exposure control, adjustment of the mA and/or kV according to patient size and/or use of iterative reconstruction technique. COMPARISON:  MRI 04/04/2022, CT 06/28/2022, 05/05/2022 FINDINGS: Segmentation: 5 lumbar type vertebrae. Alignment: Normal. Vertebrae: Lytic lesion occupying the majority of the L2 vertebral body with involvement of the right pedicle, right transverse process, right lamina, and spinous process. Worsening pathologic fracture of the vertebral body, now with greater than 50% vertebral body height loss. Approximately 5  mm of retropulsion. The right L2 pedicle is also pathologically fractured with intra-articular extension into the right L1-2 facet joint. Lytic lesion centered in the right L3 pedicle with subtle pathologic fracture (series 3, image 62). This lesion is new from 04/04/2022. Lytic lesion centered within the spinous process of the L4 vertebral body with involvement of the bilateral lamina. Similar in appearance to prior. Small extraosseous soft tissue masses are seen associated with the lesion at the right L2 transverse process and pedicle as well as at the L4 spinous process. Paraspinal and other soft tissues: Punctate nonobstructing right  renal calculi. Aortic atherosclerosis. Disc levels: Degenerative disc disease of L5-S1. Canal stenosis at the L2 vertebral body level secondary to bony retropulsion in presumed tumor involvement of the epidural space and right L2-3 neural foramina. IMPRESSION: 1. Worsening pathologic fracture of the L2 vertebral body, now with greater than 50% vertebral body height loss and approximately 5 mm of retropulsion. The right L2 pedicle is also pathologically fractured with intra-articular extension into the right L1-2 facet joint. 2. Lytic lesion centered in the right L3 pedicle with subtle pathologic fracture, new from 04/04/2022. 3. Lytic lesion centered within the spinous process of L4 vertebral body with involvement of the bilateral lamina, similar in appearance to prior. 4. Canal stenosis at the L2 vertebral body level secondary to bony retropulsion in presumed tumor involvement of the epidural space and right L2-3 neural foramina. Aortic Atherosclerosis (ICD10-I70.0). Electronically Signed   By: Davina Poke D.O.   On: 07/17/2022 16:24    Labs:  CBC: Recent Labs    07/28/22 1954 07/29/22 0615  WBC 7.6 6.7  HGB 14.3 12.4*  HCT 41.8 36.0*  PLT 219 190    COAGS: No results for input(s): "INR", "APTT" in the last 8760 hours.  BMP: Recent Labs    07/28/22 1954 07/29/22 0615  NA 138 137  K 3.9 3.4*  CL 102 104  CO2 19* 20*  GLUCOSE 314* 245*  BUN 13 13  CALCIUM 10.1 9.0  CREATININE 0.92 0.89  GFRNONAA >60 >60    LIVER FUNCTION TESTS: Recent Labs    07/29/22 0615  BILITOT 0.8  AST 7*  ALT 8  ALKPHOS 71  PROT 5.6*  ALBUMIN 2.8*    TUMOR MARKERS: No results for input(s): "AFPTM", "CEA", "CA199", "CHROMGRNA" in the last 8760 hours.  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  Patient assessed alongside Dr. Karenann Cai in the ED.  He states he was unable to undergo PET scan today due to severe intractable back pain.  He is  being admitted for pain control and his current regimen is insufficient in light of progressive and worsening suspected metastatic disease.  Patient has been undergoing work-up for bone lesion biopsy, osteocool ablation, and vertebroplasty/kyphoplasty at L2, possibly L3 as an outpatient, however has been unable to do so.  IR consulted for ongoing work-up for procedure during admission.  Spoke with patient at bedside who states he could not manage his pain at home. Discussed possible procedure pending insurance approval.  He did eat breakfast this AM.  NPO now for possible procedure this afternoon.   Risks and benefits were discussed with the patient including, but not limited to education regarding the natural healing process of compression fractures without intervention, bleeding, infection, cement migration which may cause spinal cord damage, paralysis, pulmonary embolism or even death.  This interventional procedure involves the use of X-rays and because of the nature of  the planned procedure, it is possible that we will have prolonged use of X-ray fluoroscopy.  Potential radiation risks to you include (but are not limited to) the following: - A slightly elevated risk for cancer  several years later in life. This risk is typically less than 0.5% percent. This risk is low in comparison to the normal incidence of human cancer, which is 33% for women and 50% for men according to the [place]. - Radiation induced injury can include skin redness, resembling a rash, tissue breakdown / ulcers and hair loss (which can be temporary or permanent).   The likelihood of either of these occurring depends on the difficulty of the procedure and whether you are sensitive to radiation due to previous procedures, disease, or genetic conditions.   IF your procedure requires a prolonged use of radiation, you will be notified and given written instructions for further action.  It is your  responsibility to monitor the irradiated area for the 2 weeks following the procedure and to notify your physician if you are concerned that you have suffered a radiation induced injury.    All of the patient's questions were answered, patient is agreeable to proceed.  Consent signed and in chart.  Thank you for this interesting consult.  I greatly enjoyed meeting Samuel [name] and look forward to participating in their care.  A copy of this report was sent to the requesting provider on this date.  Electronically Signed: Docia Barrier, PA 07/29/2022, 10:24 AM   I spent a total of 40 Minutes    in face to face in clinical consultation, greater than 50% of which was counseling/coordinating care for L2, L3 lesion.

## 2022-07-29 NOTE — Progress Notes (Signed)
PT Cancellation Note  Patient Details Name: Samuel [name] MRN: [id number] DOB: 1966/03/04   Cancelled Treatment:    Reason Eval/Treat Not Completed: Medical issues which prohibited therapy - pending  procedure this afternoon, will check back post-procedure.   Stacie Glaze, PT DPT Acute Rehabilitation Services Pager [phone]  Office [phone]    Louis Matte 07/29/2022, 1:23 PM

## 2022-07-29 NOTE — Progress Notes (Signed)
  Carryover admission to the Day Admitter.  I discussed this case with the EDP, Dr. Christy Gentles.  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  The patient has recently established with Dr. Tammi Klippel of oncology, with PET scan that was originally scheduled for today.  Additionally, arrangements have been underway for associated kyphoplasty.  In setting of worsening of low back pain with the above associated symptoms, MRI of the thoracic/lumbar spine was performed in the ED today, demonstrating known pathological fracture of L2 with 40% vertebral body height loss, as well as moderate spinal canal stenosis with moderate compression of the L2 nerve roots, without report of associated spinal cord impingement.  EDP discussed the patient's case as well as these updated MRI findings with the on-call neurosurgeon, who conveyed that there are no indications for neurosurgical intervention at this time, but rather recommended continuation of existing T SLO as well as consideration for initiation of steroids.  EDP also conveys that the patient clinically appears dehydrated, and labs notable for mild hyperglycemia, with presenting glucose of approximately 300.  Following 2 doses of IV fentanyl in the ED, patient still reports suboptimal pain control, the patient is subsequently being admitted for observation for pain control as relates to his known pathological fracture of L2.   I have placed an order for observation to med telemetry for this purpose.  I have placed  some additional preliminary admit orders via the adult multi-morbid admission order set. I have also ordered morning labs, as well as prn IV Dilaudid and fall precautions.  Relating to his dehydration/hyperglycemia, the patient is status post 2 L of NS bolus.  I have ordered continuous LR at 125 cc/h as well as CBG monitoring with sliding scale insulin.    Babs Bertin, DO Hospitalist

## 2022-07-29 NOTE — Progress Notes (Signed)
Interventional Radiology Brief Note:  Unable to accomodate patient in IR today.  Procedure expected early next week.  Primary team made aware.  Patient may eat.   Brynda Greathouse, MS RD PA-C

## 2022-07-29 NOTE — ED Notes (Signed)
Pt to IR with IR nurses.

## 2022-07-29 NOTE — ED Notes (Signed)
Patient transported to MRI 

## 2022-07-29 NOTE — Inpatient Diabetes Management (Signed)
Inpatient Diabetes Program Recommendations  AACE/ADA: New Consensus Statement on Inpatient Glycemic Control (2015)  Target Ranges:  Prepandial:   less than 140 mg/dL      Peak postprandial:   less than 180 mg/dL (1-2 hours)      Critically ill patients:  140 - 180 mg/dL   Lab Results  Component Value Date   GLUCAP 170 (H) 07/29/2022   HGBA1C 13.0 (H) 07/29/2022    Review of Glycemic Control  Diabetes history: DM 2 Outpatient Diabetes medications: None (pt was on 70/30 16 units bid in April) Current orders for Inpatient glycemic control:  Semglee 32 units Novolog 0-15 units tid  A1c 13%  Spoke with pt at bedside regarding A1c of 13% and glucose control at home. Pt reports ever since getting out of Jail April 17th he has not been on medication. Pt reports his A1c was a little over 7% at that time and was on 70/30 16 units bid. Pt reports weighing 40 pounds heavier. Pt also reports being prescribed a prednisone taper outpatient recently but was unable to complete the packet. Pt reports having supplies to check his glucose with at home. Discussed importance of glucose control. Reviewed lifestyle modifications with diet. Told pt he will need to be on insulin again when he is discharged.  Thanks,  Tama Headings RN, MSN, BC-ADM Inpatient Diabetes Coordinator Team Pager [phone] (8a-5p)

## 2022-07-30 DIAGNOSIS — S32029A Unspecified fracture of second lumbar vertebra, initial encounter for closed fracture: Secondary | ICD-10-CM | POA: Diagnosis not present

## 2022-07-30 DIAGNOSIS — E11649 Type 2 diabetes mellitus with hypoglycemia without coma: Secondary | ICD-10-CM

## 2022-07-30 LAB — GLUCOSE, CAPILLARY
Glucose-Capillary: 129 mg/dL — ABNORMAL HIGH (ref 70–99)
Glucose-Capillary: 161 mg/dL — ABNORMAL HIGH (ref 70–99)
Glucose-Capillary: 247 mg/dL — ABNORMAL HIGH (ref 70–99)
Glucose-Capillary: 223 mg/dL — ABNORMAL HIGH (ref 70–99)

## 2022-07-30 LAB — MAGNESIUM: Magnesium: 1.6 mg/dL — ABNORMAL LOW (ref 1.7–2.4)

## 2022-07-30 LAB — CBC WITH DIFFERENTIAL/PLATELET
Abs Immature Granulocytes: 0.02 10*3/uL (ref 0.00–0.07)
Basophils Absolute: 0 10*3/uL (ref 0.0–0.1)
Basophils Relative: 0 %
Eosinophils Absolute: 0 10*3/uL (ref 0.0–0.5)
Eosinophils Relative: 1 %
HCT: 32.6 % — ABNORMAL LOW (ref 39.0–52.0)
Hemoglobin: 11.5 g/dL — ABNORMAL LOW (ref 13.0–17.0)
Immature Granulocytes: 0 %
Lymphocytes Relative: 34 %
Lymphs Abs: 2 10*3/uL (ref 0.7–4.0)
MCH: 32 pg (ref 26.0–34.0)
MCHC: 35.3 g/dL (ref 30.0–36.0)
MCV: 90.8 fL (ref 80.0–100.0)
Monocytes Absolute: 0.4 10*3/uL (ref 0.1–1.0)
Monocytes Relative: 7 %
Neutro Abs: 3.3 10*3/uL (ref 1.7–7.7)
Neutrophils Relative %: 58 %
Platelets: 173 10*3/uL (ref 150–400)
RBC: 3.59 MIL/uL — ABNORMAL LOW (ref 4.22–5.81)
RDW: 12.3 % (ref 11.5–15.5)
WBC: 5.7 10*3/uL (ref 4.0–10.5)
nRBC: 0 % (ref 0.0–0.2)

## 2022-07-30 LAB — COMPREHENSIVE METABOLIC PANEL
ALT: 9 U/L (ref 0–44)
AST: 13 U/L — ABNORMAL LOW (ref 15–41)
Albumin: 2.5 g/dL — ABNORMAL LOW (ref 3.5–5.0)
Alkaline Phosphatase: 62 U/L (ref 38–126)
Anion gap: 5 (ref 5–15)
BUN: 9 mg/dL (ref 6–20)
CO2: 26 mmol/L (ref 22–32)
Calcium: 8.7 mg/dL — ABNORMAL LOW (ref 8.9–10.3)
Chloride: 106 mmol/L (ref 98–111)
Creatinine, Ser: 0.49 mg/dL — ABNORMAL LOW (ref 0.61–1.24)
GFR, Estimated: >60 mL/min (ref >60)
Glucose, Bld: 124 mg/dL — ABNORMAL HIGH (ref 70–99)
Potassium: 3.5 mmol/L (ref 3.5–5.1)
Sodium: 137 mmol/L (ref 135–145)
Total Bilirubin: 0.1 mg/dL — ABNORMAL LOW (ref 0.3–1.2)
Total Protein: 5.1 g/dL — ABNORMAL LOW (ref 6.5–8.1)

## 2022-07-30 LAB — PHOSPHORUS: Phosphorus: 3 mg/dL (ref 2.5–4.6)

## 2022-07-30 LAB — IRON AND TIBC
Iron: 77 ug/dL (ref 45–182)
Saturation Ratios: 44 % — ABNORMAL HIGH (ref 17.9–39.5)
TIBC: 176 ug/dL — ABNORMAL LOW (ref 250–450)
UIBC: 99 ug/dL

## 2022-07-30 LAB — RETICULOCYTES
Immature Retic Fract: 5 % (ref 2.3–15.9)
RBC.: 3.61 MIL/uL — ABNORMAL LOW (ref 4.22–5.81)
Retic Count, Absolute: 34.3 10*3/uL (ref 19.0–186.0)
Retic Ct Pct: 1 % (ref 0.4–3.1)

## 2022-07-30 LAB — FERRITIN: Ferritin: 153 ng/mL (ref 24–336)

## 2022-07-30 LAB — FOLATE: Folate: 10.5 ng/mL (ref >5.9)

## 2022-07-30 LAB — VITAMIN B12: Vitamin B-12: 783 pg/mL (ref 180–914)

## 2022-07-30 MED ORDER — LACTATED RINGERS IV SOLN
INTRAVENOUS | Status: AC
Start: 2022-07-30 — End: 2022-07-31

## 2022-07-30 MED ORDER — ADULT MULTIVITAMIN W/MINERALS CH
1.0000 | ORAL_TABLET | Freq: Every day | ORAL | Status: DC
  Administered 2022-07-30 – 2022-08-17 (×19): 1 via ORAL
  Filled 2022-07-30 (×19): qty 1

## 2022-07-30 MED ORDER — POTASSIUM CHLORIDE CRYS ER 20 MEQ PO TBCR
40.0000 meq | EXTENDED_RELEASE_TABLET | Freq: Two times a day (BID) | ORAL | Status: AC
  Administered 2022-07-30 (×2): 40 meq via ORAL
  Filled 2022-07-30 (×2): qty 2

## 2022-07-30 MED ORDER — ORAL CARE MOUTH RINSE: 15.0000 mL | OROMUCOSAL | Status: DC | PRN

## 2022-07-30 MED ORDER — ENOXAPARIN SODIUM 40 MG/0.4ML IJ SOSY
40.0000 mg | PREFILLED_SYRINGE | INTRAMUSCULAR | Status: AC
  Administered 2022-07-30 – 2022-07-31 (×2): 40 mg via SUBCUTANEOUS
  Filled 2022-07-30 (×2): qty 0.4

## 2022-07-30 MED ORDER — MAGNESIUM SULFATE 2 GM/50ML IV SOLN
2.0000 g | Freq: Once | INTRAVENOUS | Status: AC
  Administered 2022-07-30: 2 g via INTRAVENOUS
  Filled 2022-07-30: qty 50

## 2022-07-30 NOTE — Progress Notes (Signed)
PROGRESS NOTE    Samuel [name]  KZS:[id number] DOB: 06-Aug-1966 DOA: 07/28/2022 PCP: Martinique, [name], MD   Brief Narrative:   Samuel [name] is a 56 y.o. male with medical history significant of diabetes mellitus on insulin, anxiety and depression, and diabetic neuropathy.  Patient was in the process of undergoing malignancy work-up.  He was found to have back pain 4 months ago and has been receiving fragmented and incomplete care due to his social and financial circumstances.  He is currently being followed by Dr. Tyler Pita radiation oncology.  He presented to the hospital overnight with complaints of intractable pain despite use of muscle relaxers and oxycodone in the outpatient setting.  An outpatient PET scan as well as lumbar biopsy and potential kyphoplasty were planned.  Noted patient reports intractable pain, radiculopathy the type pain going into right hip and down into right leg.  He is able to walk but after he starts to walk he takes about 4 steps and is no longer able to weight-bear.  He is able to move feet on exam and lift legs from knees supine in bed.In regards to pain although patient was prescribed 5 mg of oxycodone every 6 hours this was not adequate and he had increased to 10 mg every 6 hours as needed but again was having intractable pain.  He also states that he had run out of his gabapentin.  Flexeril was not helping.  Tylenol was not helping.   Since arrival to the ER he has had some improvement in his pain with administration of IV Dilaudid and fentanyl.  Patient has lost a significant amount of weight since April noting at that time his weight was 172 pounds and now his weight is 127 pounds.  He states he cannot eat because of the pain.  He has difficulty sleeping because of the pain.  His abdomen is flat and he states he has not been having a bowel movement likely because he has not been ingesting enough food.  He was normotensive and afebrile and slightly  tachycardic.  He was not hypoxic.  Glucoses have ranged in the mid to high 200s to just above 300.  Magnesium low at 1.5.  Albumin low at 2.8.  Renal function normal with a normal BUN, CRP normal.  White count normal.  Urinalysis abnormal with glucose greater than 500 and ketones of 80 with an elevated specific gravity of 1.036.  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  Interim History Interventional radiology has been consulted for bone lesion biopsy, osteochondral ablation and vertebral plasty/kyphoplasty at L2 and L3 possibly and this was post be done Friday however patient had to be moved to next week given emergent cases.  Currently his pain is doing a little bit better.   Assessment and Plan:  Intractable lumbar pain secondary to metastatic disease of uncertain etiology -Patient reports right lower extremity radiculopathy symptoms with some superficial numbness of right thigh region. -Appreciate Dr. Tammi Klippel assistance.   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  -Agree with need for CT biopsy of L2 with radiofrequency ablation and kyphoplasty.  Patient may need to transfer to [facility] especially if previously ordered PET scan needs to be accomplished to  assist with diagnosis and treatment. -Have added a lidocaine patch  -May benefit from palliative care consultation to assist with pain management and once source of malignancy identified may benefit from goals of care conversation -Checked TSH was 1.203 -He is getting IV fluid hydration with lactated Ringer's at 1.5 MLS per hour but will reduce the rate to 75 MLS per hour for 12 more  hours -Continue with analgesics including hydromorphone 0.5 mg IV every 2 as needed severe pain, ketorolac 15 mg IV every 6, morphine 15 mg p.o. every 12; also has some naloxone if necessary -Common sources for spinal metastasis are lung, breast, prostate, melanoma, kidney and thyroid.  Patient had recent PSA that was normal.  Chest x-ray normal.   -Begin bowel prophylaxis with Colace and MiraLAX  -We will have PT and OT to further evaluate and treat   Severe protein calorie malnutrition suspected due to malignancy -Patient states in April that his weight was 172 pounds and reports that he believes his weight has decreased to 127 pounds. -His laboratory data reveals a slightly elevated anion gap and low CO2 probably from hyperglycemia noting glucose is 314 but there is probably a component of starvation ketosis as well -Based on reported weight of 127 pounds BMI would be 19.3 which has decreased from a previous BMI of 20.98 -We will consult nutrition and appreciate their assistance -Nutrition Status: Nutrition Problem: Increased nutrient needs Etiology: chronic illness Signs/Symptoms: estimated needs Interventions: Glucerna shake, MVI, Education -Begin liquid protein supplements with Glucerna since patient is diabetic   Uncontrolled diabetes mellitus type 2 associated with hyperglycemia  -Current hemoglobin A1c 13, presented with significant glycosuria and ketones in urine with mild elevation in anion gap -Continue Lantus -Receive IV contrast so will hold metformin for now -Continue to monitor CBGs per protocol; CBGs ranging from 129-247   Hypomagnesemia -Mag level was 1.6 -Replete with IV mag sulfate 2 g and will replete with IV mag sulfate 4 g yesterday -Continue monitor and replete as necessary -Repeat mag level in a.m.  Hypokalemia -Repleted and improved -Continue to monitor replete as necessary -Repeat CMP in a.m.  Depression and Anxiety -Continue Lexapro and trazodone    HLD -Continue Lipitor noting that LFTs are normal   Hypoalbuminemia -Patient's albumin level is 2.5 now -Continue to monitor and trend and repeat CMP in a.m.  Normocytic Anemia -Patient's hemoglobin/hematocrit went from 12.4/36.0 and is now 11.5/32.6 -Checked anemia panel and showed an iron level of 77, U IBC of 99, TIBC 176, saturation ratios of 44%, ferritin level 153, folate level 10.5, vitamin B12 level 783 -Continue to monitor for signs and symptoms bleeding; no overt bleeding noted For repeat CBC in a.m.  DVT prophylaxis: SCDs Start: 07/29/22 0412    Code Status: Full Code Family Communication: No family currently at bedside  Disposition Plan:  Level of care: Telemetry Medical Status is: Inpatient Remains inpatient appropriate because: Needs a lumbar biopsy and likely kyphoplasty   Consultants:  Interventional radiology Radiation oncology  Procedures:  As needed as above  Antimicrobials:  Anti-infectives (From admission, onward)    None       Subjective: Seen and examined at bedside and thinks he is doing a little bit better today.  States that his pain is not as bad.  States that he actually slept last night and slept for at least 5 hours straight.  No nausea or vomiting.  No other concerns or complaints this time.  Objective: Vitals:   07/30/22 0981 07/30/22 1914  07/30/22 0753 07/30/22 1126  BP:  92/64 (!) 89/62 106/75  Pulse:  86 87 83  Resp:   16 16  Temp:   98.4 F (36.9 C) 97.9 F (36.6 C)  TempSrc:   Oral Oral  SpO2:  98% 98% 99%  Weight: 59.4 kg     Height:        Intake/Output Summary (Last 24 hours) at 07/30/2022 1408 Last data filed at 07/30/2022 1540 Gross per 24 hour  Intake --  Output 875 ml  Net -875 ml   Filed Weights   07/28/22 1956 07/30/22 0434  Weight: 62.6 kg 59.4 kg   Examination: Physical Exam:  Constitutional: Thin cachectic appearing chronically ill-appearing African-American male currently no acute  distress Respiratory: Diminished to auscultation bilaterally, no wheezing, rales, rhonchi or crackles. Normal respiratory effort and patient is not tachypenic. No accessory muscle use.  Unlabored breathing Cardiovascular: RRR, no murmurs / rubs / gallops. S1 and S2 auscultated. No extremity edema.  Abdomen: Soft, non-tender, non-distended. Bowel sounds positive.  GU: Deferred. Musculoskeletal: No clubbing / cyanosis of digits/nails. No joint deformity upper and lower extremities. .  Skin: No rashes, lesions, ulcers on limited skin evaluation. No induration; Warm and dry.  Neurologic: CN 2-12 grossly intact with no focal deficits.  Romberg sign and cerebellar reflexes not assessed.  Psychiatric: Normal judgment and insight. Alert and oriented x 3. Normal mood and appropriate affect.   Data Reviewed: I have personally reviewed following labs and imaging studies  CBC: Recent Labs  Lab 07/28/22 1954 07/29/22 0615 07/30/22 0651  WBC 7.6 6.7 5.7  NEUTROABS 4.9 3.7 3.3  HGB 14.3 12.4* 11.5*  HCT 41.8 36.0* 32.6*  MCV 93.5 94.7 90.8  PLT 219 190 086   Basic Metabolic Panel: Recent Labs  Lab 07/28/22 1954 07/29/22 0615 07/30/22 0651  NA 138 137 137  K 3.9 3.4* 3.5  CL 102 104 106  CO2 19* 20* 26  GLUCOSE 314* 245* 124*  BUN 13 13 9   CREATININE 0.92 0.89 0.49*  CALCIUM 10.1 9.0 8.7*  MG  --  1.5* 1.6*  PHOS  --   --  3.0   GFR: Estimated Creatinine Clearance: 84.6 mL/min (A) (by C-G formula based on SCr of 0.49 mg/dL (L)). Liver Function Tests: Recent Labs  Lab 07/29/22 0615 07/30/22 0651  AST 7* 13*  ALT 8 9  ALKPHOS 71 62  BILITOT 0.8 0.1*  PROT 5.6* 5.1*  ALBUMIN 2.8* 2.5*   No results for input(s): "LIPASE", "AMYLASE" in the last 168 hours. No results for input(s): "AMMONIA" in the last 168 hours. Coagulation Profile: Recent Labs  Lab 07/29/22 1016  INR 1.1   Cardiac Enzymes: No results for input(s): "CKTOTAL", "CKMB", "CKMBINDEX", "TROPONINI" in the last  168 hours. BNP (last 3 results) No results for input(s): "PROBNP" in the last 8760 hours. HbA1C: Recent Labs    07/29/22 0615  HGBA1C 13.0*   CBG: Recent Labs  Lab 07/29/22 1307 07/29/22 1643 07/29/22 2225 07/30/22 0634 07/30/22 1128  GLUCAP 170* 218* 319* 129* 161*   Lipid Profile: No results for input(s): "CHOL", "HDL", "LDLCALC", "TRIG", "CHOLHDL", "LDLDIRECT" in the last 72 hours. Thyroid Function Tests: Recent Labs    07/29/22 0500  TSH 1.203   Anemia Panel: Recent Labs    07/30/22 0651  VITAMINB12 783  FOLATE 10.5  FERRITIN 153  TIBC 176*  IRON 77  RETICCTPCT 1.0   Sepsis Labs: No results for input(s): "PROCALCITON", "LATICACIDVEN" in the last 168 hours.  No results found for this or any previous visit (from the past 240 hour(s)).   Radiology Studies: MR Lumbar Spine W Wo Contrast  Result Date: 07/29/2022 CLINICAL DATA:  Worsening low back pain EXAM: MRI THORACIC AND LUMBAR SPINE WITHOUT AND WITH CONTRAST TECHNIQUE: Multiplanar and multiecho pulse sequences of the thoracic and lumbar spine were obtained without and with intravenous contrast. CONTRAST:  6.14mL GADAVIST GADOBUTROL 1 MMOL/ML IV SOLN COMPARISON:  MRI lumbar spine 04/04/2022 FINDINGS: MRI THORACIC SPINE FINDINGS Alignment:  No significant listhesis. Vertebrae: Focal area of T1 hypointense and T2 hyperintense signal with contrast enhancement in the posteroinferior aspect of T9 (series 25, image 7) is concerning for osseous metastatic disease. No acute fracture. Cord:  Normal signal and morphology.  No abnormal enhancement. Paraspinal and other soft tissues: Negative. Disc levels: No spinal canal stenosis or significant neural foraminal narrowing. MRI LUMBAR SPINE FINDINGS Segmentation:  Standard. Alignment: Straightening of the normal lumbar lordosis. No significant listhesis. Vertebrae: Decreased T1 signal, increased T2 signal and abnormal enhancement in the L2 vertebral body and right-greater-than-left  posterior elements (series 6, images 6, 8, and 11), with pathologic compression fracture that has progressed compared to 04/04/2022, with approximately 40% vertebral body height loss and 7 mm of retropulsion. Additional abnormal signal is noted in the right posterosuperior aspect of L3, extending into the right pedicle and proximal spinous process (series 6, images 5-8); L4 posterior elements (series 6, images 4-13); and inferoposterior left aspect of L5 (series 6, image 10). Vertebral body heights are otherwise preserved. Conus medullaris: Extends to the L1 level and appears normal. No abnormal enhancement. Paraspinal and other soft tissues: Increased T2 signal in the left paraspinous musculature at L4, which may be inflammatory. Disc levels: T12-L1: No significant disc bulge. No spinal canal stenosis or neural foraminal narrowing. L1-L2: No significant disc bulge. No spinal canal stenosis at the disc space. No neural foraminal narrowing. Posterior to the L1 vertebral body, there is moderate spinal canal stenosis secondary to retropulsion. This likely compresses the descending right-greater-than-left L2 nerve roots. L2-L3: No significant disc bulge. No spinal canal stenosis. Mild left and moderate right neural foraminal narrowing, secondary to the vertebral body fracture and epidural extension of tumor. L3-L4: No significant disc bulge. No spinal canal stenosis or neural foraminal narrowing. L4-L5: Mild disc bulge. Narrowing of the left lateral recess, which could affect the descending left L5 nerve roots. Mild-to-moderate left neural foraminal narrowing. L5-S1: Disc height loss with right paracentral, subarticular, and foraminal disc protrusion, which narrows the right lateral recess, likely affecting the descending right S1 nerve roots. No spinal canal stenosis. Mild right-greater-than-left neural foraminal narrowing. IMPRESSION: 1. Progression of the previously noted pathologic fracture at L2, with approximately  40% vertebral body height loss and 7 mm retropulsion causes moderate spinal canal stenosis posterior to the L2 vertebral body. This likely compresses the descending right-greater-than-left L2 nerve roots. This also causes moderate right and mild left neural foraminal narrowing at L2-L3. 2. Abnormal signal throughout the L2 vertebral body and right-greater-than-left posterior elements, most likely representing metastatic disease with myeloma or lymphoma felt to be less likely. Additional osseous lesions in T9, L3, L4 and L5. No additional pathologic fracture. 3. Increased T2 signal in the left paraspinous musculature at L4, which may be inflammatory. 4. No additional spinal canal stenosis. L4-L5 mild-to-moderate left neural foraminal narrowing and L5-S1 mild bilateral neural foraminal narrowing. Electronically Signed   By: Merilyn Baba M.D.   On: 07/29/2022 02:35   MR THORACIC SPINE W WO  CONTRAST  Result Date: 07/29/2022 CLINICAL DATA:  Worsening low back pain EXAM: MRI THORACIC AND LUMBAR SPINE WITHOUT AND WITH CONTRAST TECHNIQUE: Multiplanar and multiecho pulse sequences of the thoracic and lumbar spine were obtained without and with intravenous contrast. CONTRAST:  6.66mL GADAVIST GADOBUTROL 1 MMOL/ML IV SOLN COMPARISON:  MRI lumbar spine 04/04/2022 FINDINGS: MRI THORACIC SPINE FINDINGS Alignment:  No significant listhesis. Vertebrae: Focal area of T1 hypointense and T2 hyperintense signal with contrast enhancement in the posteroinferior aspect of T9 (series 25, image 7) is concerning for osseous metastatic disease. No acute fracture. Cord:  Normal signal and morphology.  No abnormal enhancement. Paraspinal and other soft tissues: Negative. Disc levels: No spinal canal stenosis or significant neural foraminal narrowing. MRI LUMBAR SPINE FINDINGS Segmentation:  Standard. Alignment: Straightening of the normal lumbar lordosis. No significant listhesis. Vertebrae: Decreased T1 signal, increased T2 signal and  abnormal enhancement in the L2 vertebral body and right-greater-than-left posterior elements (series 6, images 6, 8, and 11), with pathologic compression fracture that has progressed compared to 04/04/2022, with approximately 40% vertebral body height loss and 7 mm of retropulsion. Additional abnormal signal is noted in the right posterosuperior aspect of L3, extending into the right pedicle and proximal spinous process (series 6, images 5-8); L4 posterior elements (series 6, images 4-13); and inferoposterior left aspect of L5 (series 6, image 10). Vertebral body heights are otherwise preserved. Conus medullaris: Extends to the L1 level and appears normal. No abnormal enhancement. Paraspinal and other soft tissues: Increased T2 signal in the left paraspinous musculature at L4, which may be inflammatory. Disc levels: T12-L1: No significant disc bulge. No spinal canal stenosis or neural foraminal narrowing. L1-L2: No significant disc bulge. No spinal canal stenosis at the disc space. No neural foraminal narrowing. Posterior to the L1 vertebral body, there is moderate spinal canal stenosis secondary to retropulsion. This likely compresses the descending right-greater-than-left L2 nerve roots. L2-L3: No significant disc bulge. No spinal canal stenosis. Mild left and moderate right neural foraminal narrowing, secondary to the vertebral body fracture and epidural extension of tumor. L3-L4: No significant disc bulge. No spinal canal stenosis or neural foraminal narrowing. L4-L5: Mild disc bulge. Narrowing of the left lateral recess, which could affect the descending left L5 nerve roots. Mild-to-moderate left neural foraminal narrowing. L5-S1: Disc height loss with right paracentral, subarticular, and foraminal disc protrusion, which narrows the right lateral recess, likely affecting the descending right S1 nerve roots. No spinal canal stenosis. Mild right-greater-than-left neural foraminal narrowing. IMPRESSION: 1.  Progression of the previously noted pathologic fracture at L2, with approximately 40% vertebral body height loss and 7 mm retropulsion causes moderate spinal canal stenosis posterior to the L2 vertebral body. This likely compresses the descending right-greater-than-left L2 nerve roots. This also causes moderate right and mild left neural foraminal narrowing at L2-L3. 2. Abnormal signal throughout the L2 vertebral body and right-greater-than-left posterior elements, most likely representing metastatic disease with myeloma or lymphoma felt to be less likely. Additional osseous lesions in T9, L3, L4 and L5. No additional pathologic fracture. 3. Increased T2 signal in the left paraspinous musculature at L4, which may be inflammatory. 4. No additional spinal canal stenosis. L4-L5 mild-to-moderate left neural foraminal narrowing and L5-S1 mild bilateral neural foraminal narrowing. Electronically Signed   By: Merilyn Baba M.D.   On: 07/29/2022 02:35   DG Chest 2 View  Result Date: 07/28/2022 CLINICAL DATA:  Weakness EXAM: CHEST - 2 VIEW COMPARISON:  03/09/2021 FINDINGS: The heart size and mediastinal contours are  within normal limits. Aortic atherosclerosis. Both lungs are clear. Multiple old right-sided rib fractures. 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 IMPRESSION: No active cardiopulmonary disease. Electronically Signed   By: Donavan Foil M.D.   On: 07/28/2022 20:35    Scheduled Meds:  atorvastatin  40 mg Oral Daily   docusate sodium  100 mg Oral BID   escitalopram  20 mg Oral Daily   feeding supplement (GLUCERNA SHAKE)  237 mL Oral TID BM   gabapentin  300 mg Oral TID   insulin aspart  0-15 Units Subcutaneous TID WC   insulin glargine-yfgn  32 Units Subcutaneous Daily   ketorolac  15 mg Intravenous Q6H   lidocaine  1 patch Transdermal Q24H   morphine  15 mg Oral Q12H   multivitamin with minerals  1 tablet Oral Daily   polyethylene glycol  17 g Oral QHS   potassium chloride  40 mEq Oral BID    traZODone  50 mg Oral QHS   Continuous Infusions:  lactated ringers 125 mL/hr at 07/30/22 0732    LOS: 1 day   Raiford Noble, DO [facility] Available via Epic secure chat 7am-7pm After these hours, please refer to coverage provider listed on [email or web address] 07/30/2022, 2:08 PM

## 2022-07-30 NOTE — Progress Notes (Signed)
Initial Nutrition Assessment  INTERVENTION:   -Glucerna Shake po TID, each supplement provides 220 kcal and 10 grams of protein   -Multivitamin with minerals daily  -Placed "Carbohydrate Counting" handout in AVS  NUTRITION DIAGNOSIS:   Increased nutrient needs related to chronic illness as evidenced by estimated needs.  GOAL:   Patient will meet greater than or equal to 90% of their needs  MONITOR:   PO intake, Supplement acceptance, Labs, Weight trends, I & O's  REASON FOR ASSESSMENT:   Consult Assessment of nutrition requirement/status  ASSESSMENT:   56 y.o. male with medical history significant of diabetes mellitus on insulin, anxiety and depression, and diabetic neuropathy.  Patient was in the process of undergoing malignancy work-up.  He was found to have back pain 4 months ago and has been receiving fragmented and incomplete care due to his social and financial circumstances.  He is currently being followed by Dr. Tyler Pita radiation oncology.  Patient with new spinal mets, awaiting biopsy, ablation and kyphoplasty. Pt did eat some breakfast 9/8 but was NPO for most of day pending procedures. Glucerna shakes were ordered given pt's elevated blood sugars. Accepting most of these supplements. Will order daily MVI as suspect has not been eating well d/t weight loss and continued alcohol use.  Pt reports 40 lbs of weight loss since released from jail in April 2023. Current weight: 130 lbs.  Medications: Colace, Miralax, KLOR-CON, IV Mg sulfate  Labs reviewed:  CBGs: 129-319 Low Mg  NUTRITION - FOCUSED PHYSICAL EXAM:  Unable to complete, working remote  Diet Order:   Diet Order             Diet regular Room service appropriate? Yes; Fluid consistency: Thin  Diet effective now                   EDUCATION NEEDS:   Not appropriate for education at this time  Skin:  Skin Assessment: Reviewed RN Assessment  Last BM:  9/4  Height:   Ht Readings  from Last 1 Encounters:  07/28/22 5\' 8"  (1.727 m)    Weight:   Wt Readings from Last 1 Encounters:  07/30/22 59.4 kg    BMI:  Body mass index is 19.91 kg/m.  Estimated Nutritional Needs:   Kcal:  1610-9604  Protein:  90-105g  Fluid:  1.9L/day  Clayton Bibles, MS, RD, LDN Inpatient Clinical Dietitian Contact information available via Amion

## 2022-07-30 NOTE — Evaluation (Signed)
Physical Therapy Evaluation Patient Details Name: Samuel [name] MRN: [id number] DOB: 1966-02-28 Today's Date: 07/30/2022  History of Present Illness  56 yo male with onset of intractible back pain with spine mets (unsure of primary per hx) was admitted on 9/7, now has plan for repletion of electrolytes, pain management and has pending plan for biopsy of lumbar spine and potential kyphoplasty.  Pain is in R hip and leg, low back and upper back at times.  Imaging has found mult thoracic spine spots that may be mets.  PMHx:  DM, pancreatitis, smoker, R rib and clavicle fractures,  Clinical Impression  Pt was seen for initial effort to move but pain hinders ability to stand and wb on RLE with buckling appearance on RLE.  Even so his strength is fairly good and no sensory changes from R LE to LLE are found.  Pt is truly struggling with pain to even step on side of bed and so limited his movement.  Follow up is planned to go to SNF for rehab after his intervention with neuro is done, to increase safe and independent balance, gait and management of steps.  Acute PT goals are outlined below.     Recommendations for follow up therapy are one component of a multi-disciplinary discharge planning process, led by the attending physician.  Recommendations may be updated based on patient status, additional functional criteria and insurance authorization.  Follow Up Recommendations Skilled nursing-short term rehab (<3 hours/day) Can patient physically be transported by private vehicle: No    Assistance Recommended at Discharge Frequent or constant Supervision/Assistance  Patient can return home with the following  Two people to help with walking and/or transfers;A lot of help with bathing/dressing/bathroom;Assistance with cooking/housework;Assist for transportation;Help with stairs or ramp for entrance    Equipment Recommendations Rolling walker (2 wheels)  Recommendations for Other Services        Functional Status Assessment Patient has had a recent decline in their functional status and demonstrates the ability to make significant improvements in function in a reasonable and predictable amount of time.     Precautions / Restrictions Precautions Precautions: Fall;Back Precaution Booklet Issued: No Precaution Comments: verbally reviewed back precautions, has already received information Required Braces or Orthoses: Spinal Brace Spinal Brace: Thoracolumbosacral orthotic;Applied in sitting position Restrictions Weight Bearing Restrictions: No      Mobility  Bed Mobility Overal bed mobility: Needs Assistance Bed Mobility: Supine to Sit, Sit to Supine     Supine to sit: Mod assist Sit to supine: Mod assist   General bed mobility comments: mod assist due to pain    Transfers Overall transfer level: Needs assistance Equipment used: Rolling walker (2 wheels) Transfers: Sit to/from Stand Sit to Stand: Mod assist           General transfer comment: mod with pain initially that worsens on RLE    Ambulation/Gait               General Gait Details: too painful to attempt  Stairs            Wheelchair Mobility    Modified Rankin (Stroke Patients Only)       Balance Overall balance assessment: Needs assistance Sitting-balance support: Feet supported Sitting balance-Leahy Scale: Fair   Postural control: Posterior lean Standing balance support: Bilateral upper extremity supported, During functional activity Standing balance-Leahy Scale: Poor  Pertinent Vitals/Pain Pain Assessment Pain Assessment: Faces Faces Pain Scale: Hurts whole lot Pain Location: back and RLE upon standing Pain Descriptors / Indicators: Grimacing, Aching, Spasm, Sharp Pain Intervention(s): Limited activity within patient's tolerance, Monitored during session, Repositioned, Premedicated before session    Home Living Family/patient  expects to be discharged to:: Private residence Living Arrangements: Other relatives (home with sister) Available Help at Discharge: Family Type of Home: House Home Access: Stairs to enter Entrance Stairs-Rails: Can reach both Entrance Stairs-Number of Steps: 5   Home Layout: One level Home Equipment: None Additional Comments: TLSO for spine but no other equipment    Prior Function Prior Level of Function : Independent/Modified Independent             Mobility Comments: no AD used but recently having to walk just a few steps at a time       Hand Dominance   Dominant Hand: Right    Extremity/Trunk Assessment   Upper Extremity Assessment Upper Extremity Assessment: Overall WFL for tasks assessed    Lower Extremity Assessment Lower Extremity Assessment: RLE deficits/detail RLE Deficits / Details: strength and sensation WFL but coordination is less in standing with pain limiting him RLE Coordination: decreased gross motor    Cervical / Trunk Assessment Cervical / Trunk Assessment: Other exceptions (spinal mets multi level thoracic and lumbar spine) Cervical / Trunk Exceptions: TLSO for L2 fracture  Communication   Communication: No difficulties  Cognition Arousal/Alertness: Awake/alert Behavior During Therapy: Anxious Overall Cognitive Status: Within Functional Limits for tasks assessed                                 General Comments: pt in pain mainly with movement and fairly comfortable at rest        General Comments General comments (skin integrity, edema, etc.): pt is too painful to walk, struggling to control descent to bed to sit comfortably, very unmanaged pain with spine    Exercises     Assessment/Plan    PT Assessment Patient needs continued PT services  PT Problem List Pain;Decreased coordination;Decreased balance;Decreased activity tolerance;Decreased mobility       PT Treatment Interventions DME instruction;Gait training;Stair  training;Functional mobility training;Therapeutic activities;Therapeutic exercise;Balance training;Neuromuscular re-education;Patient/family education    PT Goals (Current goals can be found in the Care Plan section)  Acute Rehab PT Goals Patient Stated Goal: to walk and get pain managed PT Goal Formulation: With patient Time For Goal Achievement: 08/13/22 Potential to Achieve Goals: Good    Frequency Min 3X/week     Co-evaluation               AM-PAC PT "6 Clicks" Mobility  Outcome Measure Help needed turning from your back to your side while in a flat bed without using bedrails?: A Lot Help needed moving from lying on your back to sitting on the side of a flat bed without using bedrails?: A Lot Help needed moving to and from a bed to a chair (including a wheelchair)?: A Lot Help needed standing up from a chair using your arms (e.g., wheelchair or bedside chair)?: A Lot Help needed to walk in hospital room?: Total Help needed climbing 3-5 steps with a railing? : Total 6 Click Score: 10    End of Session Equipment Utilized During Treatment: Gait belt Activity Tolerance: Treatment limited secondary to medical complications (Comment);Patient limited by pain Patient left: in bed;with call bell/phone within reach;with bed  alarm set Nurse Communication: Mobility status PT Visit Diagnosis: Unsteadiness on feet (R26.81);Pain;Difficulty in walking, not elsewhere classified (R26.2) Pain - Right/Left: Right Pain - part of body: Hip;Knee;Leg    Time: 3009-7949 PT Time Calculation (min) (ACUTE ONLY): 23 min   Charges:   PT Evaluation $PT Eval Moderate Complexity: 1 Mod PT Treatments $Therapeutic Activity: 8-22 mins       Ramond Dial 07/30/2022, 2:31 PM  Mee Hives, PT PhD Acute Rehab Dept. Number: [facility]

## 2022-07-30 NOTE — Discharge Instructions (Signed)

## 2022-07-30 NOTE — Plan of Care (Signed)

## 2022-07-31 DIAGNOSIS — S32029A Unspecified fracture of second lumbar vertebra, initial encounter for closed fracture: Secondary | ICD-10-CM | POA: Diagnosis not present

## 2022-07-31 LAB — CBC WITH DIFFERENTIAL/PLATELET
Abs Immature Granulocytes: 0.02 10*3/uL (ref 0.00–0.07)
Basophils Absolute: 0 10*3/uL (ref 0.0–0.1)
Basophils Relative: 0 %
Eosinophils Absolute: 0.1 10*3/uL (ref 0.0–0.5)
Eosinophils Relative: 1 %
HCT: 31.8 % — ABNORMAL LOW (ref 39.0–52.0)
Hemoglobin: 11.2 g/dL — ABNORMAL LOW (ref 13.0–17.0)
Immature Granulocytes: 0 %
Lymphocytes Relative: 25 %
Lymphs Abs: 1.8 10*3/uL (ref 0.7–4.0)
MCH: 32.3 pg (ref 26.0–34.0)
MCHC: 35.2 g/dL (ref 30.0–36.0)
MCV: 91.6 fL (ref 80.0–100.0)
Monocytes Absolute: 0.4 10*3/uL (ref 0.1–1.0)
Monocytes Relative: 5 %
Neutro Abs: 5 10*3/uL (ref 1.7–7.7)
Neutrophils Relative %: 69 %
Platelets: 149 10*3/uL — ABNORMAL LOW (ref 150–400)
RBC: 3.47 MIL/uL — ABNORMAL LOW (ref 4.22–5.81)
RDW: 12.4 % (ref 11.5–15.5)
WBC: 7.3 10*3/uL (ref 4.0–10.5)
nRBC: 0 % (ref 0.0–0.2)

## 2022-07-31 LAB — COMPREHENSIVE METABOLIC PANEL
ALT: 21 U/L (ref 0–44)
AST: 30 U/L (ref 15–41)
Albumin: 2.4 g/dL — ABNORMAL LOW (ref 3.5–5.0)
Alkaline Phosphatase: 67 U/L (ref 38–126)
Anion gap: 6 (ref 5–15)
BUN: 13 mg/dL (ref 6–20)
CO2: 29 mmol/L (ref 22–32)
Calcium: 8.8 mg/dL — ABNORMAL LOW (ref 8.9–10.3)
Chloride: 102 mmol/L (ref 98–111)
Creatinine, Ser: 0.37 mg/dL — ABNORMAL LOW (ref 0.61–1.24)
GFR, Estimated: >60 mL/min (ref >60)
Glucose, Bld: 150 mg/dL — ABNORMAL HIGH (ref 70–99)
Potassium: 4.1 mmol/L (ref 3.5–5.1)
Sodium: 137 mmol/L (ref 135–145)
Total Bilirubin: 0.4 mg/dL (ref 0.3–1.2)
Total Protein: 5 g/dL — ABNORMAL LOW (ref 6.5–8.1)

## 2022-07-31 LAB — PHOSPHORUS: Phosphorus: 3.3 mg/dL (ref 2.5–4.6)

## 2022-07-31 LAB — MAGNESIUM: Magnesium: 1.8 mg/dL (ref 1.7–2.4)

## 2022-07-31 LAB — GLUCOSE, CAPILLARY
Glucose-Capillary: 247 mg/dL — ABNORMAL HIGH (ref 70–99)
Glucose-Capillary: 153 mg/dL — ABNORMAL HIGH (ref 70–99)
Glucose-Capillary: 123 mg/dL — ABNORMAL HIGH (ref 70–99)
Glucose-Capillary: 109 mg/dL — ABNORMAL HIGH (ref 70–99)

## 2022-07-31 MED ORDER — MAGNESIUM SULFATE 2 GM/50ML IV SOLN
2.0000 g | Freq: Once | INTRAVENOUS | Status: AC
  Administered 2022-07-31: 2 g via INTRAVENOUS
  Filled 2022-07-31: qty 50

## 2022-07-31 NOTE — Progress Notes (Signed)
Interventional Radiology Brief Note:  Possible procedure with IR tomorrow.  NPO p MN.  Lovenox to be given today, then none further.   Brynda Greathouse, MS RD PA-C

## 2022-07-31 NOTE — TOC Initial Note (Addendum)
Transition of Care [facility]) - Initial/Assessment Note    Patient Details  Name: Samuel [name] MRN: [id number] Date of Birth: 1966/08/03  Transition of Care [facility]) CM/SW Contact:    Milas Gain, [place] Phone Number: 07/31/2022, 11:31 AM  Clinical Narrative:                  CSW received consult for possible SNF placement at time of discharge. CSW spoke with patient regarding PT recommendation of SNF placement at time of discharge. Patient reports he comes from home with sister. Patient expressed understanding of PT recommendation and is agreeable to SNF placement at time of discharge. Patient gave CSW permission to fax out initial referral near the [place] and [place] area. CSW discussed insurance authorization process and will provide Medicare SNF ratings list with accepted SNF bed offers when available. Patient reports he has received the COVID vaccines and 2 boosters. Patients passr is pending. CSW awaiting FL2 and 30 day note to be signed by MD to submit requested clinicals to [facility] must for review. No further questions reported at this time. CSW to continue to follow and assist with discharge planning needs.   Expected Discharge Plan: Skilled Nursing Facility Barriers to Discharge: Continued Medical Work up   Patient Goals and CMS Choice Patient states their goals for this hospitalization and ongoing recovery are:: SNF CMS Medicare.gov Compare Post Acute Care list provided to:: Patient Choice offered to / list presented to : Patient  Expected Discharge Plan and Services Expected Discharge Plan: [place] In-house Referral: Clinical Social Work     Living arrangements for the past 2 months: [place]                                      Prior Living Arrangements/Services Living arrangements for the past 2 months: Single Family Home Lives with:: Siblings (Lives with his sister Dominica) Patient language and need for interpreter reviewed::  Yes Do you feel safe going back to the place where you live?: No   SNF  Need for Family Participation in Patient Care: Yes (Comment) Care giver support system in place?: Yes (comment)   Criminal Activity/Legal Involvement Pertinent to Current Situation/Hospitalization: No - Comment as needed  Activities of Daily Living Home Assistive Devices/Equipment: Brace (specify type) ADL Screening (condition at time of admission) Patient's cognitive ability adequate to safely complete daily activities?: Yes Is the patient deaf or have difficulty hearing?: No Does the patient have difficulty seeing, even when wearing glasses/contacts?: No Does the patient have difficulty concentrating, remembering, or making decisions?: No Patient able to express need for assistance with ADLs?: Yes Does the patient have difficulty dressing or bathing?: No Independently performs ADLs?: Yes (appropriate for developmental age) Does the patient have difficulty walking or climbing stairs?: No Weakness of Legs: Both Weakness of Arms/Hands: None  Permission Sought/Granted Permission sought to share information with : Case Manager, Family Supports, Chartered certified accountant granted to share information with : Yes, Verbal Permission Granted  Share Information with NAME: Jodi Mourning  Permission granted to share info w AGENCY: SNF  Permission granted to share info w Relationship: sister  Permission granted to share info w Contact Information: Jodi Mourning [phone]  Emotional Assessment   Attitude/Demeanor/Rapport: Gracious Affect (typically observed): Calm Orientation: : Oriented to Self, Oriented to Place, Oriented to  Time, Oriented to Situation Alcohol / Substance Use: Not Applicable Psych Involvement: No (  comment)  Admission diagnosis:  Dehydration [E86.0] Hyperglycemia [R73.9] Intractable back pain [M54.9] L2 vertebral fracture ([place]) [S32.029A] Pathological fracture of vertebra due to neoplastic  disease, sequela [M84.58XS] Patient Active Problem List   Diagnosis Date Noted   L2 vertebral fracture ([place]) 07/29/2022   Metastasis to spinal column ([place]) 07/19/2022   Major depressive disorder, severe ([place]) 03/10/2021   Suicidal ideation    GAD (generalized anxiety disorder)    Hyperlipidemia 12/26/2020   Vitamin D deficiency 12/26/2020   Insomnia 12/26/2020   Neuropathy 12/26/2020   Gastroesophageal reflux disease without esophagitis 12/26/2020   Nausea and vomiting 03/20/2020   Pancreatitis 03/20/2020   Other male erectile dysfunction 11/20/2019   Microalbuminuria due to type 2 diabetes mellitus ([place]) 09/10/2019   Tobacco dependence 08/13/2019   DM type 2 with diabetic peripheral neuropathy ([place]) 08/13/2019   Clavicle fracture 02/28/2017   Alcohol withdrawal ([place]) 12/26/2016   Alcohol use disorder, severe, dependence ([place]) 12/26/2016   Major depressive disorder 12/26/2016   Diabetes ([place]) 12/26/2016   Tobacco use disorder 12/26/2016   PCP:  [place], [name], MD Pharmacy:   [id number] [name], [place] [address] [place] [address] Phone: [phone] Fax: [place] [facility] [place] [employer], [place] [address] Phone: [phone] Fax: [phone]  [id number] - [place], [place] [place] [address] Phone: [phone] Fax: [phone]  [facility] [address]), [place] [address] [place] [address] Phone: [phone] Fax: [phone]     Social Determinants of Health (SDOH) Interventions    Readmission Risk Interventions     No data to display

## 2022-07-31 NOTE — Progress Notes (Signed)
PROGRESS NOTE    Samuel [name]  RAQ:[id number] DOB: [birth year] DOA: 07/28/2022 PCP: [place], [name], MD   Brief Narrative:  Samuel [name] is a 56 y.o. male with medical history significant of diabetes mellitus on insulin, anxiety and depression, and diabetic neuropathy.  Patient was in the process of undergoing malignancy work-up.  He was found to have back pain 4 months ago and has been receiving fragmented and incomplete care due to his social and financial circumstances.  He is currently being followed by Dr. Tyler Pita radiation oncology.  He presented to the hospital overnight with complaints of intractable pain despite use of muscle relaxers and oxycodone in the outpatient setting.  An outpatient PET scan as well as lumbar biopsy and potential kyphoplasty were planned.  Noted patient reports intractable pain, radiculopathy the type pain going into right hip and down into right leg.  He is able to walk but after he starts to walk he takes about 4 steps and is no longer able to weight-bear.  He is able to move feet on exam and lift legs from knees supine in bed.In regards to pain although patient was prescribed 5 mg of oxycodone every 6 hours this was not adequate and he had increased to 10 mg every 6 hours as needed but again was having intractable pain.  He also states that he had run out of his gabapentin.  Flexeril was not helping.  Tylenol was not helping.   Since arrival to the ER he has had some improvement in his pain with administration of IV Dilaudid and fentanyl.  Patient has lost a significant amount of weight since April noting at that time his weight was 172 pounds and now his weight is 127 pounds.  He states he cannot eat because of the pain.  He has difficulty sleeping because of the pain.  His abdomen is flat and he states he has not been having a bowel movement likely because he has not been ingesting enough food.  He was normotensive and afebrile and slightly  tachycardic.  He was not hypoxic.  Glucoses have ranged in the mid to high 200s to just above 300.  Magnesium low at 1.5.  Albumin low at 2.8.  Renal function normal with a normal BUN, CRP normal.  White count normal.  Urinalysis abnormal with glucose greater than 500 and ketones of 80 with an elevated specific gravity of 1.036.  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   Interim History Interventional radiology has been consulted for bone lesion biopsy, osteochondral ablation and vertebral plasty/kyphoplasty at L2 and L3 possibly and this was post be done Friday however patient had to be moved to next week given emergent cases.  Currently his pain is doing a little bit better.   Assessment and Plan:  Intractable lumbar pain secondary to metastatic disease of uncertain etiology -Patient reports right lower extremity radiculopathy symptoms with some superficial numbness of right thigh region. -Appreciate Dr. Tammi Klippel assistance.   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  -Agree with need for CT biopsy of L2 with radiofrequency ablation and kyphoplasty.  Patient may need to transfer to [facility] especially if previously ordered PET scan needs to be accomplished to  assist with diagnosis and treatment. -Have added a lidocaine patch  -May benefit from palliative care consultation to assist with pain management and once source of malignancy identified may benefit from goals of care conversation -Checked TSH was 1.203 -He is getting IV fluid hydration with lactated Ringer's at 1.5 MLS per hour but will reduce the rate to 75 MLS per hour for 12 more  hours -Continue with analgesics including hydromorphone 0.5 mg IV every 2 as needed severe pain, ketorolac 15 mg IV every 6, morphine 15 mg p.o. every 12; also has some naloxone if necessary -Common sources for spinal metastasis are lung, breast, prostate, melanoma, kidney and thyroid.  Patient had recent PSA that was normal.  Chest x-ray normal.   -Begin bowel prophylaxis with Colace and MiraLAX  -We will have PT and OT to further evaluate and treat and they will likely recommend SNF -IR came by and they are recommending n.p.o. after midnight and holding Lovenox for possible procedure tomorrow   Severe protein calorie malnutrition suspected due to malignancy -Patient states in April that his weight was 172 pounds and reports that he believes his weight has decreased to 127 pounds. -His laboratory data reveals a slightly elevated anion gap and low CO2 probably from hyperglycemia noting glucose is 314 but there is probably a component of starvation ketosis as well -Based on reported weight of 127 pounds BMI would be 19.3 which has decreased from a previous BMI of 20.98 -We will consult nutrition and appreciate their assistance -Nutrition Status: Nutrition Problem: Increased nutrient needs Etiology: chronic illness Signs/Symptoms: estimated needs Interventions: Glucerna shake, MVI, Education -Begin liquid protein supplements with Glucerna since patient is diabetic   Uncontrolled diabetes mellitus type 2 associated with hyperglycemia  -Current hemoglobin A1c 13, presented with significant glycosuria and ketones in urine with mild elevation in anion gap -Continue Lantus -Receive IV contrast so will hold metformin for now -Continue to monitor CBGs per protocol; CBGs ranging from 153-247   Hypomagnesemia -Mag level is now 1.8 -Replete with IV mag sulfate 2 g  -Continue monitor and replete as necessary -Repeat mag level in a.m.   Hypokalemia -Repleted and improved -Continue to monitor  replete as necessary -Repeat CMP in a.m.   Depression and Anxiety -Continue Lexapro and trazodone   HLD -Continue Lipitor noting that LFTs are normal   Hypoalbuminemia -Patient's albumin level is 2.5 now today today's 2.4 -Continue to monitor and trend and repeat CMP in a.m.   Normocytic Anemia -Patient's hemoglobin/hematocrit went from 12.4/36.0 -> 11.5/32.6 -> 11.2/31.8 -Checked anemia panel and showed an iron level of 77, U IBC of 99, TIBC 176, saturation ratios of 44%, ferritin level 153, folate level 10.5, vitamin B12 level 783 -Continue to monitor for signs and symptoms bleeding; no overt bleeding noted -Repeat CBC in a.m.   DVT prophylaxis: enoxaparin (LOVENOX) injection 40 mg Start: 07/30/22 1545 SCDs Start: 07/29/22 0412    Code Status: Full Code Family Communication: No family currently at bedside  Disposition Plan:  Level of care: Telemetry Medical Status is: Inpatient Remains inpatient appropriate because: Undergoing interventional radiology procedure likely tomorrow   Consultants:  Interventional radiology Radiation oncology  Procedures:  As delineated as above  Antimicrobials:  Anti-infectives (From admission, onward)    None       Subjective: Seen and examined at bedside and he thinks that his pain is doing little bit better but states that he did not sleep very well last night.  No nausea or vomiting. denies any  lightheadedness or dizziness.  He about some thigh numbness in his lower extremity specifically on his right side compared to the left.  Still appears weak.  No other concerns or complaints at this time.  Objective: Vitals:   07/31/22 0445 07/31/22 0750 07/31/22 1141 07/31/22 1639  BP:  98/68 122/82 116/80  Pulse:  93 94 96  Resp:  16 16 16   Temp:  98 F (36.7 C) 98.2 F (36.8 C) 98.1 F (36.7 C)  TempSrc:  Oral Oral Oral  SpO2:  98% 99% 99%  Weight: 64.4 kg     Height:        Intake/Output Summary (Last 24 hours) at 07/31/2022  1649 Last data filed at 07/31/2022 0900 Gross per 24 hour  Intake 734.59 ml  Output 1325 ml  Net -590.41 ml   Filed Weights   07/28/22 1956 07/30/22 0434 07/31/22 0445  Weight: 62.6 kg 59.4 kg 64.4 kg   Examination: Physical Exam:  Constitutional: WN/WD in no acute distress appears calm laying in the bed Respiratory: Diminished to auscultation bilaterally with coarse breath sounds, no wheezing, rales, rhonchi or crackles. Normal respiratory effort and patient is not tachypenic. No accessory muscle use.  Unlabored breathing Cardiovascular: RRR, no murmurs / rubs / gallops. S1 and S2 auscultated.  Abdomen: Soft, non-tender, non-distended. Bowel sounds positive.  GU: Deferred. Musculoskeletal: No clubbing / cyanosis of digits/nails. No joint deformity upper and lower extremities. Skin: No rashes, lesions, ulcers on limited skin evaluation. No induration; Warm and dry.  Neurologic: CN 2-12 grossly intact with no focal deficits.  Romberg sign and cerebellar reflexes not assessed.  Psychiatric: Normal judgment and insight. Alert and oriented x 3. Normal mood and appropriate affect.   Data Reviewed: I have personally reviewed following labs and imaging studies  CBC: Recent Labs  Lab 07/28/22 1954 07/29/22 0615 07/30/22 0651 07/31/22 0206  WBC 7.6 6.7 5.7 7.3  NEUTROABS 4.9 3.7 3.3 5.0  HGB 14.3 12.4* 11.5* 11.2*  HCT 41.8 36.0* 32.6* 31.8*  MCV 93.5 94.7 90.8 91.6  PLT 219 190 173 357*   Basic Metabolic Panel: Recent Labs  Lab 07/28/22 1954 07/29/22 0615 07/30/22 0651 07/31/22 0206  NA 138 137 137 137  K 3.9 3.4* 3.5 4.1  CL 102 104 106 102  CO2 19* 20* 26 29  GLUCOSE 314* 245* 124* 150*  BUN 13 13 9 13   CREATININE 0.92 0.89 0.49* 0.37*  CALCIUM 10.1 9.0 8.7* 8.8*  MG  --  1.5* 1.6* 1.8  PHOS  --   --  3.0 3.3   GFR: Estimated Creatinine Clearance: 91.7 mL/min (A) (by C-G formula based on SCr of 0.37 mg/dL (L)). Liver Function Tests: Recent Labs  Lab  07/29/22 0615 07/30/22 0651 07/31/22 0206  AST 7* 13* 30  ALT 8 9 21   ALKPHOS 71 62 67  BILITOT 0.8 0.1* 0.4  PROT 5.6* 5.1* 5.0*  ALBUMIN 2.8* 2.5* 2.4*   No results for input(s): "LIPASE", "AMYLASE" in the last 168 hours. No results for input(s): "AMMONIA" in the last 168 hours. Coagulation Profile: Recent Labs  Lab 07/29/22 1016  INR 1.1   Cardiac Enzymes: No results for input(s): "CKTOTAL", "CKMB", "CKMBINDEX", "TROPONINI" in the last 168 hours. BNP (last 3 results) No results for input(s): "PROBNP" in the last 8760 hours. HbA1C: Recent Labs    07/29/22 0615  HGBA1C 13.0*   CBG: Recent Labs  Lab 07/30/22 1128 07/30/22 1540 07/30/22 2205 07/31/22 0657 07/31/22 1142  GLUCAP 161* 247* 223*  247* 153*   Lipid Profile: No results for input(s): "CHOL", "HDL", "LDLCALC", "TRIG", "CHOLHDL", "LDLDIRECT" in the last 72 hours. Thyroid Function Tests: Recent Labs    07/29/22 0500  TSH 1.203   Anemia Panel: Recent Labs    07/30/22 0651  VITAMINB12 783  FOLATE 10.5  FERRITIN 153  TIBC 176*  IRON 77  RETICCTPCT 1.0   Sepsis Labs: No results for input(s): "PROCALCITON", "LATICACIDVEN" in the last 168 hours.  No results found for this or any previous visit (from the past 240 hour(s)).   Radiology Studies: No results found.  Scheduled Meds:  atorvastatin  40 mg Oral Daily   docusate sodium  100 mg Oral BID   enoxaparin (LOVENOX) injection  40 mg Subcutaneous Q24H   escitalopram  20 mg Oral Daily   feeding supplement (GLUCERNA SHAKE)  237 mL Oral TID BM   gabapentin  300 mg Oral TID   insulin aspart  0-15 Units Subcutaneous TID WC   insulin glargine-yfgn  32 Units Subcutaneous Daily   ketorolac  15 mg Intravenous Q6H   lidocaine  1 patch Transdermal Q24H   morphine  15 mg Oral Q12H   multivitamin with minerals  1 tablet Oral Daily   polyethylene glycol  17 g Oral QHS   traZODone  50 mg Oral QHS   Continuous Infusions:   LOS: 2 days   Raiford Noble,  DO [facility] Available via Epic secure chat 7am-7pm After these hours, please refer to coverage provider listed on [email or web address] 07/31/2022, 4:49 PM

## 2022-07-31 NOTE — [place] FL2 (Signed)
[place] MEDICAID FL2 LEVEL OF CARE SCREENING TOOL     IDENTIFICATION  Patient Name: Samuel [name] Birthdate: June 09, 1966 Sex: male Admission Date (Current Location): 07/28/2022  [facility] and [place] Number:  Herbalist and Address:  [facility] [employer], [place] [address] [place], [place] [address]      Provider Number: [id number]  Attending Physician Name and Address:  Kerney Elbe, DO  Relative Name and Phone Number:  Jodi Mourning (Sister) [phone]    Current Level of Care: Hospital Recommended Level of Care: [place] Prior Approval Number:    Date Approved/Denied:   PASRR Number: PASRR under review  Discharge Plan: SNF    Current Diagnoses: Patient Active Problem List   Diagnosis Date Noted   L2 vertebral fracture ([place]) 07/29/2022   Metastasis to spinal column ([place]) 07/19/2022   Major depressive disorder, severe ([place]) 03/10/2021   Suicidal ideation    GAD (generalized anxiety disorder)    Hyperlipidemia 12/26/2020   Vitamin D deficiency 12/26/2020   Insomnia 12/26/2020   Neuropathy 12/26/2020   Gastroesophageal reflux disease without esophagitis 12/26/2020   Nausea and vomiting 03/20/2020   Pancreatitis 03/20/2020   Other male erectile dysfunction 11/20/2019   Microalbuminuria due to type 2 diabetes mellitus ([place]) 09/10/2019   Tobacco dependence 08/13/2019   DM type 2 with diabetic peripheral neuropathy ([place]) 08/13/2019   Clavicle fracture 02/28/2017   Alcohol withdrawal ([place]) 12/26/2016   Alcohol use disorder, severe, dependence ([place]) 12/26/2016   Major depressive disorder 12/26/2016   Diabetes ([place]) 12/26/2016   Tobacco use disorder 12/26/2016    Orientation RESPIRATION BLADDER Height & Weight     Self, Time, Situation, Place  Normal Continent Weight: 141 lb 15.6 oz (64.4 kg) Height:  5\' 8"  (172.7 cm)  BEHAVIORAL SYMPTOMS/MOOD NEUROLOGICAL BOWEL NUTRITION STATUS      Continent (WDL) Diet (Please see  discharge summary)  AMBULATORY STATUS COMMUNICATION OF NEEDS Skin   Extensive Assist Verbally Other (Comment) (WDL)                       Personal Care Assistance Level of Assistance  Bathing, Feeding, Dressing Bathing Assistance: Maximum assistance Feeding assistance: Independent (can feed self) Dressing Assistance: Maximum assistance     Functional Limitations Info  Sight, Hearing, Speech Sight Info: Adequate (WDL) Hearing Info: Adequate Speech Info: Adequate    SPECIAL CARE FACTORS FREQUENCY  PT (By licensed PT), OT (By licensed OT)     PT Frequency: 5x min weekly OT Frequency: 5x min weekly            Contractures Contractures Info: Not present    Additional Factors Info  Code Status, Allergies, Psychotropic, Insulin Sliding Scale Code Status Info: FULL Allergies Info: Penicillins,Percocet (oxycodone-acetaminophen) Psychotropic Info: escitalopram (LEXAPRO) tablet 20 mg daily,traZODone (DESYREL) tablet 50 mg daily at bedtime Insulin Sliding Scale Info: insulin aspart (novoLOG) injection 0-15 Units 3 times daily with meals,insulin glargine-yfgn (SEMGLEE) injection 32 Units daily       Current Medications (07/31/2022):  This is the current hospital active medication list Current Facility-Administered Medications  Medication Dose Route Frequency Provider Last Rate Last Admin   acetaminophen (TYLENOL) tablet 650 mg  650 mg Oral Q6H PRN Howerter, Justin B, DO       Or   acetaminophen (TYLENOL) suppository 650 mg  650 mg Rectal Q6H PRN Howerter, Justin B, DO       atorvastatin (LIPITOR) tablet 40 mg  40 mg  Oral Daily Samella Parr, NP   40 mg at 07/31/22 9476   cyclobenzaprine (FLEXERIL) tablet 10 mg  10 mg Oral Q12H PRN Samella Parr, NP   10 mg at 07/30/22 5465   docusate sodium (COLACE) capsule 100 mg  100 mg Oral BID Samella Parr, NP   100 mg at 07/31/22 0937   enoxaparin (LOVENOX) injection 40 mg  40 mg Subcutaneous Q24H Sheikh, Omair [place], DO   40 mg  at 07/30/22 1654   escitalopram (LEXAPRO) tablet 20 mg  20 mg Oral Daily Samella Parr, NP   20 mg at 07/31/22 0936   feeding supplement (GLUCERNA SHAKE) (GLUCERNA SHAKE) liquid 237 mL  237 mL Oral TID BM Samella Parr, NP   237 mL at 07/31/22 0937   gabapentin (NEURONTIN) capsule 300 mg  300 mg Oral TID Samella Parr, NP   300 mg at 07/31/22 0354   HYDROmorphone (DILAUDID) injection 0.5 mg  0.5 mg Intravenous Q2H PRN Howerter, Justin B, DO   0.5 mg at 07/31/22 0945   hydrOXYzine (ATARAX) tablet 25 mg  25 mg Oral Q6H PRN Samella Parr, NP   25 mg at 07/30/22 1411   insulin aspart (novoLOG) injection 0-15 Units  0-15 Units Subcutaneous TID WC Howerter, Justin B, DO   5 Units at 07/31/22 0700   insulin glargine-yfgn (SEMGLEE) injection 32 Units  32 Units Subcutaneous Daily Samella Parr, NP   32 Units at 07/31/22 0946   ketorolac (TORADOL) 15 MG/ML injection 15 mg  15 mg Intravenous Q6H Erin Hearing L, NP   15 mg at 07/31/22 0551   lidocaine (LIDODERM) 5 % 1 patch  1 patch Transdermal Q24H Samella Parr, NP   1 patch at 07/31/22 0936   morphine (MS CONTIN) 12 hr tablet 15 mg  15 mg Oral Q12H Samella Parr, NP   15 mg at 07/31/22 6568   multivitamin with minerals tablet 1 tablet  1 tablet Oral Daily Raiford Noble [place], DO   1 tablet at 07/31/22 0936   naloxone [facility]) injection 0.4 mg  0.4 mg Intravenous PRN Howerter, Justin B, DO       ondansetron (ZOFRAN) injection 4 mg  4 mg Intravenous Q6H PRN Howerter, Justin B, DO       Oral care mouth rinse  15 mL Mouth Rinse PRN Sheikh, Omair Latif, DO       polyethylene glycol (MIRALAX / GLYCOLAX) packet 17 g  17 g Oral QHS Samella Parr, NP   17 g at 07/30/22 2139   traZODone (DESYREL) tablet 50 mg  50 mg Oral QHS Samella Parr, NP   50 mg at 07/30/22 2139     Discharge Medications: Please see discharge summary for a list of discharge medications.  Relevant Imaging Results:  Relevant Lab Results:   Additional  Information [phone], Both Covid Vaccines, 2 boosters  Milas Gain, LCSWA

## 2022-07-31 NOTE — Progress Notes (Addendum)
RE: Samuel [name]   Date of Birth: Sep 23, 1966  Date: 07/31/2022  To Whom It May Concern:  Please be advised that the above-named patient will require a short-term nursing home stay - anticipated 30 days or less for rehabilitation and strengthening. The plan is for return home.

## 2022-08-01 ENCOUNTER — Inpatient Hospital Stay (HOSPITAL_COMMUNITY): Payer: BLUE CROSS/BLUE SHIELD

## 2022-08-01 ENCOUNTER — Inpatient Hospital Stay: Payer: Medicaid Other

## 2022-08-01 DIAGNOSIS — S32029A Unspecified fracture of second lumbar vertebra, initial encounter for closed fracture: Secondary | ICD-10-CM | POA: Diagnosis not present

## 2022-08-01 HISTORY — PX: IR KYPHO LUMBAR INC FX REDUCE BONE BX UNI/BIL CANNULATION INC/IMAGING: IMG5519

## 2022-08-01 HISTORY — PX: IR BONE TUMOR(S)RF ABLATION: IMG2284

## 2022-08-01 LAB — GLUCOSE, CAPILLARY
Glucose-Capillary: 132 mg/dL — ABNORMAL HIGH (ref 70–99)
Glucose-Capillary: 119 mg/dL — ABNORMAL HIGH (ref 70–99)
Glucose-Capillary: 213 mg/dL — ABNORMAL HIGH (ref 70–99)

## 2022-08-01 LAB — CBC WITH DIFFERENTIAL/PLATELET
Abs Immature Granulocytes: 0.02 10*3/uL (ref 0.00–0.07)
Basophils Absolute: 0 10*3/uL (ref 0.0–0.1)
Basophils Relative: 0 %
Eosinophils Absolute: 0.1 10*3/uL (ref 0.0–0.5)
Eosinophils Relative: 1 %
HCT: 36.4 % — ABNORMAL LOW (ref 39.0–52.0)
Hemoglobin: 12.6 g/dL — ABNORMAL LOW (ref 13.0–17.0)
Immature Granulocytes: 0 %
Lymphocytes Relative: 15 %
Lymphs Abs: 1.3 10*3/uL (ref 0.7–4.0)
MCH: 32.4 pg (ref 26.0–34.0)
MCHC: 34.6 g/dL (ref 30.0–36.0)
MCV: 93.6 fL (ref 80.0–100.0)
Monocytes Absolute: 0.6 10*3/uL (ref 0.1–1.0)
Monocytes Relative: 7 %
Neutro Abs: 6.3 10*3/uL (ref 1.7–7.7)
Neutrophils Relative %: 77 %
Platelets: 153 10*3/uL (ref 150–400)
RBC: 3.89 MIL/uL — ABNORMAL LOW (ref 4.22–5.81)
RDW: 12.5 % (ref 11.5–15.5)
WBC: 8.3 10*3/uL (ref 4.0–10.5)
nRBC: 0 % (ref 0.0–0.2)

## 2022-08-01 LAB — COMPREHENSIVE METABOLIC PANEL
ALT: 37 U/L (ref 0–44)
AST: 38 U/L (ref 15–41)
Albumin: 2.7 g/dL — ABNORMAL LOW (ref 3.5–5.0)
Alkaline Phosphatase: 76 U/L (ref 38–126)
Anion gap: 7 (ref 5–15)
BUN: 15 mg/dL (ref 6–20)
CO2: 28 mmol/L (ref 22–32)
Calcium: 8.9 mg/dL (ref 8.9–10.3)
Chloride: 101 mmol/L (ref 98–111)
Creatinine, Ser: 0.51 mg/dL — ABNORMAL LOW (ref 0.61–1.24)
GFR, Estimated: >60 mL/min (ref >60)
Glucose, Bld: 145 mg/dL — ABNORMAL HIGH (ref 70–99)
Potassium: 4.4 mmol/L (ref 3.5–5.1)
Sodium: 136 mmol/L (ref 135–145)
Total Bilirubin: 0.2 mg/dL — ABNORMAL LOW (ref 0.3–1.2)
Total Protein: 5.7 g/dL — ABNORMAL LOW (ref 6.5–8.1)

## 2022-08-01 LAB — PHOSPHORUS: Phosphorus: 4.2 mg/dL (ref 2.5–4.6)

## 2022-08-01 LAB — MAGNESIUM: Magnesium: 1.9 mg/dL (ref 1.7–2.4)

## 2022-08-01 MED ORDER — MIDAZOLAM HCL 2 MG/2ML IJ SOLN
INTRAMUSCULAR | Status: AC | PRN
  Administered 2022-08-01 (×2): .5 mg via INTRAVENOUS
  Administered 2022-08-01: 1 mg via INTRAVENOUS
  Administered 2022-08-01: .5 mg via INTRAVENOUS

## 2022-08-01 MED ORDER — BUPIVACAINE HCL (PF) 0.5 % IJ SOLN
INTRAMUSCULAR | Status: AC
  Filled 2022-08-01: qty 60

## 2022-08-01 MED ORDER — LIDOCAINE HCL (PF) 1 % IJ SOLN
INTRAMUSCULAR | Status: AC
  Filled 2022-08-01: qty 30

## 2022-08-01 MED ORDER — FENTANYL CITRATE (PF) 100 MCG/2ML IJ SOLN
INTRAMUSCULAR | Status: AC | PRN
  Administered 2022-08-01: 25 ug via INTRAVENOUS
  Administered 2022-08-01 (×2): 50 ug via INTRAVENOUS
  Administered 2022-08-01: 25 ug via INTRAVENOUS

## 2022-08-01 MED ORDER — LIDOCAINE HCL (PF) 1 % IJ SOLN
INTRAMUSCULAR | Status: AC | PRN
  Administered 2022-08-01: 15 mL

## 2022-08-01 MED ORDER — IOHEXOL 300 MG/ML  SOLN: 100.0000 mL | Freq: Once | INTRAMUSCULAR | Status: DC | PRN

## 2022-08-01 MED ORDER — VANCOMYCIN HCL IN DEXTROSE 1-5 GM/200ML-% IV SOLN
1000.0000 mg | INTRAVENOUS | Status: AC
Start: 2022-08-01 — End: 2022-08-01
  Administered 2022-08-01: 1000 mg via INTRAVENOUS
  Filled 2022-08-01: qty 200

## 2022-08-01 MED ORDER — BUPIVACAINE HCL 0.25 % IJ SOLN
INTRAMUSCULAR | Status: AC | PRN
  Administered 2022-08-01: 15 mL

## 2022-08-01 MED ORDER — FENTANYL CITRATE (PF) 100 MCG/2ML IJ SOLN
INTRAMUSCULAR | Status: AC
  Filled 2022-08-01: qty 4

## 2022-08-01 MED ORDER — MIDAZOLAM HCL 2 MG/2ML IJ SOLN
INTRAMUSCULAR | Status: AC
  Filled 2022-08-01: qty 4

## 2022-08-01 MED ORDER — ENOXAPARIN SODIUM 40 MG/0.4ML IJ SOSY
40.0000 mg | PREFILLED_SYRINGE | Freq: Every day | INTRAMUSCULAR | Status: DC
  Administered 2022-08-02 – 2022-08-04 (×3): 40 mg via SUBCUTANEOUS
  Filled 2022-08-01 (×3): qty 0.4

## 2022-08-01 NOTE — Progress Notes (Signed)
Physical Therapy Treatment Patient Details Name: Samuel [name] MRN: [id number] DOB: 04-30-1966 Today's Date: 08/01/2022   History of Present Illness 56 yo male with onset of intractible back pain with spine mets (unsure of primary per hx) was admitted on 9/7, now has plan for repletion of electrolytes, pain management and has pending plan for biopsy of lumbar spine and potential kyphoplasty.  Pain is in R hip and leg, low back and upper back at times.  Imaging has found mult thoracic spine spots that may be mets.  PMHx:  DM, pancreatitis, smoker, R rib and clavicle fractures,    PT Comments    Pt eager to re-attempt ambulation with report of improved overall pain at 8/10 vs 10/10. Pt requiring less assist for transfers today and was able to amb 20' with use of RW. Pt very dependent on UEs as pt with report of loss of sensation/numbness in R LE with amb ultimately feeling like his R LE would give way. Pt reports dec in pain to 7/10 s/p ambulation and was very appreciative of therapy. Continue to recommend SNF upon d/c to allow for progression towards safe mod I level of function for safe transition home with sister. Acute PT to cont to follow.    Recommendations for follow up therapy are one component of a multi-disciplinary discharge planning process, led by the attending physician.  Recommendations may be updated based on patient status, additional functional criteria and insurance authorization.  Follow Up Recommendations  Skilled nursing-short term rehab (<3 hours/day) Can patient physically be transported by private vehicle: No   Assistance Recommended at Discharge Frequent or constant Supervision/Assistance  Patient can return home with the following Two people to help with walking and/or transfers;A lot of help with bathing/dressing/bathroom;Assistance with cooking/housework;Assist for transportation;Help with stairs or ramp for entrance   Equipment Recommendations  Rolling walker (2  wheels)    Recommendations for Other Services       Precautions / Restrictions Precautions Precautions: Fall;Back Precaution Booklet Issued: No Precaution Comments: verbally reviewed back precautions, has already received information Required Braces or Orthoses: Spinal Brace Spinal Brace: Thoracolumbosacral orthotic;Applied in sitting position Restrictions Weight Bearing Restrictions: No     Mobility  Bed Mobility Overal bed mobility: Needs Assistance Bed Mobility: Supine to Sit, Rolling Rolling: Min guard   Supine to sit: Min assist     General bed mobility comments: verbal cues for log roll technique, minA for trunk elevation, incresaed time, labored effort due to pain but able to complete    Transfers Overall transfer level: Needs assistance Equipment used: Rolling walker (2 wheels) Transfers: Sit to/from Stand Sit to Stand: Min assist           General transfer comment: minA to power up, bed slightly elevated, verbal cues for safe hand placement to push up from bed then transition to walker    Ambulation/Gait Ambulation/Gait assistance: Min assist Gait Distance (Feet): 20 Feet Assistive device: Rolling walker (2 wheels) Gait Pattern/deviations: Step-through pattern, Decreased stride length Gait velocity: dec Gait velocity interpretation: <1.31 ft/sec, indicative of household ambulator   General Gait Details: increased UE dependence on RW, report of onset of R LE numbness decreased sensation with sensation of R LE giving way, pt reports dec in pain from 8 to 7/10 s/p amb   Stairs             Wheelchair Mobility    Modified Rankin (Stroke Patients Only)       Balance Overall balance assessment:  Needs assistance Sitting-balance support: Feet supported Sitting balance-Leahy Scale: Fair Sitting balance - Comments: reliant on UEs to offset back pain   Standing balance support: Bilateral upper extremity supported, During functional  activity Standing balance-Leahy Scale: Poor Standing balance comment: dependent on RW                            Cognition Arousal/Alertness: Awake/alert Behavior During Therapy: WFL for tasks assessed/performed Overall Cognitive Status: Within Functional Limits for tasks assessed                                 General Comments: pt motivated and eager to get back to walking, felt better now that he had a BM yesterday        Exercises      General Comments General comments (skin integrity, edema, etc.): VSS, RN provided lidacain patch      Pertinent Vitals/Pain Pain Assessment Pain Assessment: 0-10 Faces Pain Scale: Hurts whole lot (decreased to 7/10 s/p amb) Pain Location: back and RLE upon standing Pain Descriptors / Indicators: Grimacing, Aching, Spasm, Sharp Pain Intervention(s): Limited activity within patient's tolerance    Home Living                          Prior Function            PT Goals (current goals can now be found in the care plan section) Acute Rehab PT Goals Patient Stated Goal: to walk and get pain managed PT Goal Formulation: With patient Time For Goal Achievement: 08/13/22 Potential to Achieve Goals: Good Progress towards PT goals: Progressing toward goals    Frequency    Min 3X/week      PT Plan Current plan remains appropriate    Co-evaluation              AM-PAC PT "6 Clicks" Mobility   Outcome Measure  Help needed turning from your back to your side while in a flat bed without using bedrails?: A Little Help needed moving from lying on your back to sitting on the side of a flat bed without using bedrails?: A Little Help needed moving to and from a bed to a chair (including a wheelchair)?: A Lot Help needed standing up from a chair using your arms (e.g., wheelchair or bedside chair)?: A Lot Help needed to walk in hospital room?: A Lot Help needed climbing 3-5 steps with a railing? :  Total 6 Click Score: 13    End of Session Equipment Utilized During Treatment: Gait belt Activity Tolerance: Treatment limited secondary to medical complications (Comment);Patient limited by pain Patient left: with call bell/phone within reach;in chair Nurse Communication: Mobility status PT Visit Diagnosis: Unsteadiness on feet (R26.81);Pain;Difficulty in walking, not elsewhere classified (R26.2) Pain - Right/Left: Right Pain - part of body: Hip;Knee;Leg     Time: 1117-3567 PT Time Calculation (min) (ACUTE ONLY): 17 min  Charges:  $Gait Training: 8-22 mins                     Kittie Plater, PT, DPT Acute Rehabilitation Services Secure chat preferred Office #: [phone]    Berline Lopes 08/01/2022, 9:00 AM

## 2022-08-01 NOTE — Progress Notes (Signed)
Mobility Specialist Progress Note:   08/01/22 1725  Mobility  Activity Ambulated with assistance in room  Level of Assistance Contact guard assist, steadying assist  Assistive Device Front wheel walker  Distance Ambulated (ft) 50 ft  Activity Response Tolerated fair  $Mobility charge 1 Mobility   Pt received in bed and agreeable. C/o of generalized fatigue. Pt left in bed with all needs met and call bell in reach.   Samuel [name] Mobility Specialist-Acute Rehab Secure Chat only

## 2022-08-01 NOTE — Plan of Care (Signed)
  Problem: Pain Managment: Goal: General experience of comfort will improve Outcome: Not Progressing   Problem: Safety: Goal: Ability to remain free from injury will improve Outcome: Not Progressing

## 2022-08-01 NOTE — Progress Notes (Signed)
Mobility Specialist: Progress Note   08/01/22 1035  Mobility  Activity Ambulated with assistance in room  Level of Assistance Contact guard assist, steadying assist  Assistive Device Front wheel walker  Distance Ambulated (ft) 72 ft  Activity Response Tolerated well  $Mobility charge 1 Mobility   Pt received in the bed and agreeable to mobility. Mod I with bed mobility and contact guard during ambulation. C/o 6-7/10 pain during ambulation, otherwise asymptomatic. Cues for RW proximity when turning. Pt back to bed per request with call bell in reach.   [facility] Rose-Marie Hickling Mobility Specialist Mobility Specialist [address]: [phone]

## 2022-08-01 NOTE — Progress Notes (Signed)
PROGRESS NOTE    Samuel [name]  ZOX:[id number] DOB: 1966/09/01 DOA: 07/28/2022 PCP: Martinique, [name], MD   Brief Narrative:  Samuel [name] is a 56 y.o. male with medical history significant of diabetes mellitus on insulin, anxiety and depression, and diabetic neuropathy.  Patient was in the process of undergoing malignancy work-up.  He was found to have back pain 4 months ago and has been receiving fragmented and incomplete care due to his social and financial circumstances.  He is currently being followed by Dr. Tyler Pita radiation oncology.  He presented to the hospital overnight with complaints of intractable pain despite use of muscle relaxers and oxycodone in the outpatient setting.  An outpatient PET scan as well as lumbar biopsy and potential kyphoplasty were planned.  Noted patient reports intractable pain, radiculopathy the type pain going into right hip and down into right leg.  He is able to walk but after he starts to walk he takes about 4 steps and is no longer able to weight-bear.  He is able to move feet on exam and lift legs from knees supine in bed.In regards to pain although patient was prescribed 5 mg of oxycodone every 6 hours this was not adequate and he had increased to 10 mg every 6 hours as needed but again was having intractable pain.  He also states that he had run out of his gabapentin.  Flexeril was not helping.  Tylenol was not helping.   Since arrival to the ER he has had some improvement in his pain with administration of IV Dilaudid and fentanyl.  Patient has lost a significant amount of weight since April noting at that time his weight was 172 pounds and now his weight is 127 pounds.  He states he cannot eat because of the pain.  He has difficulty sleeping because of the pain.  His abdomen is flat and he states he has not been having a bowel movement likely because he has not been ingesting enough food.  He was normotensive and afebrile and slightly  tachycardic.  He was not hypoxic.  Glucoses have ranged in the mid to high 200s to just above 300.  Magnesium low at 1.5.  Albumin low at 2.8.  Renal function normal with a normal BUN, CRP normal.  White count normal.  Urinalysis abnormal with glucose greater than 500 and ketones of 80 with an elevated specific gravity of 1.036.  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   Interim History Interventional radiology has been consulted for bone lesion biopsy, osteochondral ablation and vertebral plasty/kyphoplasty at L2 and L3 possibly and this was post be done Friday however patient had to be moved to next week given emergent cases.  Currently his pain is doing a little bit better.  Patient underwent a fluoroscopic guided L2 core biopsy, RFA and kyphoplasty today by Dr. Karenann Cai and he tolerated the procedure well without incident or complication.  We will need to follow-up on the L2 core biopsy findings.  Patient is ambulating fairly well and continues to have some pain.  Assessment and Plan:  Intractable lumbar pain secondary to metastatic disease of uncertain etiology -Patient reports right lower extremity radiculopathy symptoms with some superficial numbness of right thigh region. -Appreciate Dr. Tammi Klippel assistance.   -As noted on imaging, L2  metastatic disease has progressed to compression fracture, spinal canal stenosis with associated lower extremity weakness and radiculopathy.  -Agree with need for CT biopsy of L2 with radiofrequency ablation and kyphoplasty.  Patient may need to transfer to [facility] especially if previously ordered PET scan needs to be accomplished to assist with diagnosis and treatment. -Have added a lidocaine patch  -May benefit from  palliative care consultation to assist with pain management and once source of malignancy identified may benefit from goals of care conversation -Checked TSH was 1.203 -He is getting IV fluid hydration with lactated Ringer's at 1.5 MLS per hour but will reduce the rate to 75 MLS per hour for 12 more hours -Continue with analgesics including hydromorphone 0.5 mg IV every 2 as needed severe pain, ketorolac 15 mg IV every 6, morphine 15 mg p.o. every 12; also has some naloxone if necessary -Common sources for spinal metastasis are lung, breast, prostate, melanoma, kidney and thyroid.  Patient had recent PSA that was normal.  Chest x-ray normal.   -Patient has a brace and has been ambulating better and has a moderate 1 with bed mobility and contact-guard during ambulation -Begin bowel prophylaxis with Colace and MiraLAX  -We will have PT and OT to further evaluate and treat and they will likely recommend SNF -IR came by and they are recommending n.p.o. after midnight and holding Lovenox for possible procedure tomorrow; patient is status post fluoroscopic guided L2 core biopsy, RFA and kyphoplasty under moderate sedation with Versed and fentanyl   Severe protein calorie malnutrition suspected due to malignancy -Patient states in April that his weight was 172 pounds and reports that he believes his weight has decreased to 127 pounds. -His laboratory data reveals a slightly elevated anion gap and low CO2 probably from hyperglycemia noting glucose is 314 but there is probably a component of starvation ketosis as well -Based on reported weight of 127 pounds BMI would be 19.3 which has decreased from a previous BMI of 20.98 -We will consult nutrition and appreciate their assistance -Nutrition Status: Nutrition Problem: Increased nutrient needs Etiology: chronic illness Signs/Symptoms: estimated needs Interventions: Glucerna shake, MVI, Education   Uncontrolled diabetes mellitus type 2 associated with  hyperglycemia  -Current hemoglobin A1c 13, presented with significant glycosuria and ketones in urine with mild elevation in anion gap -Continue Lantus -Receive IV contrast so will hold metformin for now -Continue to monitor CBGs per protocol; CBGs ranging from 109-153   Hypomagnesemia -Mag level is now 1.9  -Continue monitor and replete as necessary -Repeat mag level in a.m.   Hypokalemia -Repleted and improved -Patient's potassium is now gone from 4.1 is now 4.4 -Continue to monitor replete as necessary -Repeat CMP in a.m.   Depression and Anxiety -Continue Lexapro and Trazodone   HLD -Continue Lipitor noting that LFTs are normal -AST is now 38 and ALT is now 37   Hypoalbuminemia -Patient's albumin level has gone from 2.5 -> 2.4 -> 2.7 -Continue to monitor and trend and repeat CMP in a.m.   Normocytic Anemia -Patient's hemoglobin/hematocrit went from 12.4/36.0 -> 11.5/32.6 -> 11.2/31.8 -> 12.6/36.4 -Checked anemia panel and showed an iron level of 77, U IBC of 99, TIBC 176, saturation ratios of 44%, ferritin level 153, folate level 10.5, vitamin B12 level 783 -Continue to monitor for signs and symptoms bleeding; no overt bleeding noted -Repeat CBC in a.m.   DVT prophylaxis: SCDs Start: 07/29/22 0412    Code Status: Full Code Family Communication: Family currently at bedside  Disposition Plan:  Level of care: Telemetry Medical Status is: Inpatient Remains inpatient appropriate because: Has undergone interventional radiology procedure   Consultants:  Interventional radiology Radiation oncology  Procedures:  As delineated as above  FLUOROSCOPY GUIDED L2 CORE BIOPSY, RFA AND KYPHOPLASTY  Antimicrobials:  Anti-infectives (From admission, onward)    Start     Dose/Rate Route Frequency Ordered Stop   08/01/22 0900  vancomycin (VANCOCIN) IVPB 1000 mg/200 mL premix        1,000 mg 200 mL/hr over 60 Minutes Intravenous To Radiology 08/01/22 0803 08/01/22 1155        Subjective: Seen and examined at bedside and he was resting in the bed with his baseline.  Awaiting his procedure today.  Felt hungry.  No nausea or vomiting.  States pain is doing little bit better.  States that his right thigh is still a little numb little bit better.  Has been ambulating.  States that he felt better after he had a bowel movement yesterday.  No nausea or vomiting and no other concerns or complaints at this time  Objective: Vitals:   08/01/22 1220 08/01/22 1225 08/01/22 1230 08/01/22 1235  BP: 107/78 108/80 109/87 124/87  Pulse: 93 94 96 98  Resp: 14 16 14 14   Temp:      TempSrc:      SpO2: 100% 100% 99% 98%  Weight:      Height:        Intake/Output Summary (Last 24 hours) at 08/01/2022 1239 Last data filed at 08/01/2022 0831 Gross per 24 hour  Intake 480 ml  Output 1200 ml  Net -720 ml   Filed Weights   07/30/22 0434 07/31/22 0445 08/01/22 0425  Weight: 59.4 kg 64.4 kg 64.6 kg   Examination: Physical Exam:  Constitutional: WN/WD thin African-American male currently no acute distress appears calm laying in the bed but states that he is hungry Respiratory: Diminished to auscultation bilaterally, no wheezing, rales, rhonchi or crackles. Normal respiratory effort and patient is not tachypenic. No accessory muscle use.  Unlabored breathing Cardiovascular: RRR, no murmurs / rubs / gallops. S1 and S2 auscultated.  Abdomen: Soft, non-tender, non-distended. Bowel sounds positive.  GU: Deferred. Musculoskeletal: No clubbing / cyanosis of digits/nails. No joint deformity upper and lower extremities. Wearing Back brace Skin: No rashes, lesions, ulcers on a limited skin evaluation. No induration; Warm and dry.  Neurologic: CN 2-12 grossly intact with no focal deficits.  Romberg sign and cerebellar reflexes not assessed.  Psychiatric: Normal judgment and insight. Alert and oriented x 3. Normal mood and appropriate affect.   Data Reviewed: I have personally reviewed  following labs and imaging studies  CBC: Recent Labs  Lab 07/28/22 1954 07/29/22 0615 07/30/22 0651 07/31/22 0206 08/01/22 0818  WBC 7.6 6.7 5.7 7.3 8.3  NEUTROABS 4.9 3.7 3.3 5.0 6.3  HGB 14.3 12.4* 11.5* 11.2* 12.6*  HCT 41.8 36.0* 32.6* 31.8* 36.4*  MCV 93.5 94.7 90.8 91.6 93.6  PLT 219 190 173 149* 076   Basic Metabolic Panel: Recent Labs  Lab 07/28/22 1954 07/29/22 0615 07/30/22 0651 07/31/22 0206 08/01/22 0818  NA 138 137 137 137 136  K 3.9 3.4* 3.5 4.1 4.4  CL 102 104 106 102 101  CO2 19* 20* 26 29 28   GLUCOSE 314* 245* 124* 150* 145*  BUN 13 13 9 13 15   CREATININE 0.92 0.89 0.49* 0.37* 0.51*  CALCIUM 10.1 9.0 8.7* 8.8* 8.9  MG  --  1.5* 1.6* 1.8 1.9  PHOS  --   --  3.0 3.3 4.2   GFR: Estimated Creatinine Clearance: 92 mL/min (A) (by C-G formula based on SCr of 0.51 mg/dL (L)). Liver Function Tests: Recent Labs  Lab 07/29/22 0615 07/30/22 0651 07/31/22 0206 08/01/22 0818  AST 7* 13* 30 38  ALT 8 9 21  37  ALKPHOS 71 62 67 76  BILITOT 0.8 0.1* 0.4 0.2*  PROT 5.6* 5.1* 5.0* 5.7*  ALBUMIN 2.8* 2.5* 2.4* 2.7*   No results for input(s): "LIPASE", "AMYLASE" in the last 168 hours. No results for input(s): "AMMONIA" in the last 168 hours. Coagulation Profile: Recent Labs  Lab 07/29/22 1016  INR 1.1   Cardiac Enzymes: No results for input(s): "CKTOTAL", "CKMB", "CKMBINDEX", "TROPONINI" in the last 168 hours. BNP (last 3 results) No results for input(s): "PROBNP" in the last 8760 hours. HbA1C: No results for input(s): "HGBA1C" in the last 72 hours. CBG: Recent Labs  Lab 07/31/22 0657 07/31/22 1142 07/31/22 1716 07/31/22 2109 08/01/22 0626  GLUCAP 247* 153* 123* 109* 132*   Lipid Profile: No results for input(s): "CHOL", "HDL", "LDLCALC", "TRIG", "CHOLHDL", "LDLDIRECT" in the last 72 hours. Thyroid Function Tests: No results for input(s): "TSH", "T4TOTAL", "FREET4", "T3FREE", "THYROIDAB" in the last 72 hours. Anemia Panel: Recent Labs     07/30/22 0651  VITAMINB12 783  FOLATE 10.5  FERRITIN 153  TIBC 176*  IRON 77  RETICCTPCT 1.0   Sepsis Labs: No results for input(s): "PROCALCITON", "LATICACIDVEN" in the last 168 hours.  No results found for this or any previous visit (from the past 240 hour(s)).   Radiology Studies: No results found.  Scheduled Meds:  atorvastatin  40 mg Oral Daily   bupivacaine(PF)       docusate sodium  100 mg Oral BID   escitalopram  20 mg Oral Daily   feeding supplement (GLUCERNA SHAKE)  237 mL Oral TID BM   fentaNYL       gabapentin  300 mg Oral TID   insulin aspart  0-15 Units Subcutaneous TID WC   insulin glargine-yfgn  32 Units Subcutaneous Daily   ketorolac  15 mg Intravenous Q6H   lidocaine  1 patch Transdermal Q24H   lidocaine (PF)       midazolam       morphine  15 mg Oral Q12H   multivitamin with minerals  1 tablet Oral Daily   polyethylene glycol  17 g Oral QHS   traZODone  50 mg Oral QHS   Continuous Infusions:   LOS: 3 days   Raiford Noble, DO [facility] Available via Epic secure chat 7am-7pm After these hours, please refer to coverage provider listed on [email or web address] 08/01/2022, 12:39 PM

## 2022-08-01 NOTE — Procedures (Signed)
INTERVENTIONAL NEURORADIOLOGY BRIEF POSTPROCEDURE NOTE  FLUOROSCOPY GUIDED L2 CORE BIOPSY, RFA AND KYPHOPLASTY  Attending: Dr. Pedro Earls  Diagnosis: L2 pathologic fracture  Access site: Percutaneous, bilateral transpedicular  Anesthesia: Moderate sedation  Medication used: 2.5 mg Versed IV; 150 mcg Fentanyl IV.  Complications: None.  Estimated blood loss: Negligible.  Specimen: L2 core biopsy  Findings: Pathologic compression fracture of the L2 vertebral body including right pedicle and lateral wall.  A core biopsy sample was obtained. Subsequently, radiofrequency ablation was performed followed by balloon kyphoplasty.  The patient tolerated the procedure well without incident or complication and is in stable condition.

## 2022-08-01 NOTE — Plan of Care (Signed)

## 2022-08-02 DIAGNOSIS — S32029A Unspecified fracture of second lumbar vertebra, initial encounter for closed fracture: Secondary | ICD-10-CM | POA: Diagnosis not present

## 2022-08-02 DIAGNOSIS — D649 Anemia, unspecified: Secondary | ICD-10-CM | POA: Diagnosis not present

## 2022-08-02 LAB — GLUCOSE, CAPILLARY
Glucose-Capillary: 75 mg/dL (ref 70–99)
Glucose-Capillary: 206 mg/dL — ABNORMAL HIGH (ref 70–99)
Glucose-Capillary: 213 mg/dL — ABNORMAL HIGH (ref 70–99)
Glucose-Capillary: 93 mg/dL (ref 70–99)

## 2022-08-02 LAB — CBC WITH DIFFERENTIAL/PLATELET
Abs Immature Granulocytes: 0.04 10*3/uL (ref 0.00–0.07)
Basophils Absolute: 0 10*3/uL (ref 0.0–0.1)
Basophils Relative: 0 %
Eosinophils Absolute: 0.1 10*3/uL (ref 0.0–0.5)
Eosinophils Relative: 1 %
HCT: 32.1 % — ABNORMAL LOW (ref 39.0–52.0)
Hemoglobin: 10.8 g/dL — ABNORMAL LOW (ref 13.0–17.0)
Immature Granulocytes: 1 %
Lymphocytes Relative: 16 %
Lymphs Abs: 1.4 10*3/uL (ref 0.7–4.0)
MCH: 31.7 pg (ref 26.0–34.0)
MCHC: 33.6 g/dL (ref 30.0–36.0)
MCV: 94.1 fL (ref 80.0–100.0)
Monocytes Absolute: 0.7 10*3/uL (ref 0.1–1.0)
Monocytes Relative: 8 %
Neutro Abs: 6.6 10*3/uL (ref 1.7–7.7)
Neutrophils Relative %: 74 %
Platelets: 146 10*3/uL — ABNORMAL LOW (ref 150–400)
RBC: 3.41 MIL/uL — ABNORMAL LOW (ref 4.22–5.81)
RDW: 12.5 % (ref 11.5–15.5)
WBC: 8.8 10*3/uL (ref 4.0–10.5)
nRBC: 0 % (ref 0.0–0.2)

## 2022-08-02 LAB — COMPREHENSIVE METABOLIC PANEL
ALT: 27 U/L (ref 0–44)
AST: 26 U/L (ref 15–41)
Albumin: 2.4 g/dL — ABNORMAL LOW (ref 3.5–5.0)
Alkaline Phosphatase: 59 U/L (ref 38–126)
Anion gap: 5 (ref 5–15)
BUN: 16 mg/dL (ref 6–20)
CO2: 30 mmol/L (ref 22–32)
Calcium: 9.1 mg/dL (ref 8.9–10.3)
Chloride: 101 mmol/L (ref 98–111)
Creatinine, Ser: 0.64 mg/dL (ref 0.61–1.24)
GFR, Estimated: >60 mL/min (ref >60)
Glucose, Bld: 111 mg/dL — ABNORMAL HIGH (ref 70–99)
Potassium: 4.6 mmol/L (ref 3.5–5.1)
Sodium: 136 mmol/L (ref 135–145)
Total Bilirubin: 0.3 mg/dL (ref 0.3–1.2)
Total Protein: 5.3 g/dL — ABNORMAL LOW (ref 6.5–8.1)

## 2022-08-02 LAB — MAGNESIUM: Magnesium: 1.8 mg/dL (ref 1.7–2.4)

## 2022-08-02 LAB — PHOSPHORUS: Phosphorus: 4.6 mg/dL (ref 2.5–4.6)

## 2022-08-02 MED ORDER — MAGNESIUM SULFATE 2 GM/50ML IV SOLN
2.0000 g | Freq: Once | INTRAVENOUS | Status: AC
  Administered 2022-08-02: 2 g via INTRAVENOUS
  Filled 2022-08-02: qty 50

## 2022-08-02 NOTE — Plan of Care (Signed)

## 2022-08-02 NOTE — Progress Notes (Signed)
PROGRESS NOTE    Samuel [name]  ATF:[id number] DOB: 08-16-1966 DOA: 07/28/2022 PCP: Martin[place], [name], MD   Brief Narrative:  Samuel [name] is a 56 y.o. male with medical history significant of diabetes mellitus on insulin, anxiety and depression, and diabetic neuropathy.  Patient was in the process of undergoing malignancy work-up.  He was found to have back pain 4 months ago and has been receiving fragmented and incomplete care due to his social and financial circumstances.  He is currently being followed by Dr. Tyler Pita radiation oncology.  He presented to the hospital overnight with complaints of intractable pain despite use of muscle relaxers and oxycodone in the outpatient setting.  An outpatient PET scan as well as lumbar biopsy and potential kyphoplasty were planned.  Noted patient reports intractable pain, radiculopathy the type pain going into right hip and down into right leg.  He is able to walk but after he starts to walk he takes about 4 steps and is no longer able to weight-bear.  He is able to move feet on exam and lift legs from knees supine in bed.In regards to pain although patient was prescribed 5 mg of oxycodone every 6 hours this was not adequate and he had increased to 10 mg every 6 hours as needed but again was having intractable pain.  He also states that he had run out of his gabapentin.  Flexeril was not helping.  Tylenol was not helping.   Since arrival to the ER he has had some improvement in his pain with administration of IV Dilaudid and fentanyl.  Patient has lost a significant amount of weight since April noting at that time his weight was 172 pounds and now his weight is 127 pounds.  He states he cannot eat because of the pain.  He has difficulty sleeping because of the pain.  His abdomen is flat and he states he has not been having a bowel movement likely because he has not been ingesting enough food.  He was normotensive and afebrile and slightly  tachycardic.  He was not hypoxic.  Glucoses have ranged in the mid to high 200s to just above 300.  Magnesium low at 1.5.  Albumin low at 2.8.  Renal function normal with a normal BUN, CRP normal.  White count normal.  Urinalysis abnormal with glucose greater than 500 and ketones of 80 with an elevated specific gravity of 1.036.  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   Interim History Interventional radiology has been consulted for bone lesion biopsy, osteochondral ablation and vertebral plasty/kyphoplasty at L2 and L3 possibly and this was post be done Friday however patient had to be moved to next week given emergent cases.  Currently his pain is doing a little bit better.   Patient underwent a fluoroscopic guided L2 core biopsy, RFA and kyphoplasty today by Dr. Karenann Cai and he tolerated the procedure well without incident or complication.  We will need to follow-up on the L2 core biopsy findings.  Patient is ambulating fairly well and continues to have some pain and PT OT were consulted for further evaluation.  PT recommends skilled nursing facility for the patient.  Case was discussed with radiation oncology now that his biopsy is completed and they are hoping that he can be discharged to  skilled nursing facility and can move forward with getting a PET scan to complete his work-up in the outpatient setting with plans to bring him back in 10 to 14 days for radiation treatment planning.  He appears medically stable for discharge at this time however does not have a safe discharge disposition given that PT is recommending SNF and he is homeless.  TOC assisting with disposition.  Assessment and Plan: Intractable lumbar pain secondary to metastatic disease of  uncertain etiology -Patient reports right lower extremity radiculopathy symptoms with some superficial numbness of right thigh region. -Appreciate Dr. Tammi Klippel assistance.   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  -Agree with need for CT biopsy of L2 with radiofrequency ablation and kyphoplasty.  Patient may need to transfer to [facility] especially if previously ordered PET scan needs to be accomplished to assist with diagnosis and treatment. -Have added a lidocaine patch  -May benefit from palliative care consultation to assist with pain management and once source of malignancy identified may benefit from goals of care conversation -Checked TSH was 1.203 -He is getting IV fluid hydration with lactated Ringer's at 1.5 MLS per hour but will reduce the rate to 75 MLS per hour for 12 more hours -Continue with analgesics including hydromorphone 0.5 mg IV every 2 as needed severe pain, ketorolac 15 mg IV every 6, morphine 15 mg p.o. every 12; also has some naloxone if necessary -Common sources for spinal metastasis are lung, breast, prostate, melanoma, kidney and thyroid.  Patient had recent PSA that was normal.  Chest x-ray normal.   -Patient has a brace and has been ambulating better and has a moderate 1 with bed mobility and contact-guard during ambulation -Begin bowel prophylaxis with Colace and MiraLAX  -We will have PT and OT to further evaluate and treat and they will likely recommend SNF -IR came by and they are recommending n.p.o. after midnight and holding Lovenox for possible procedure tomorrow; patient is status post fluoroscopic guided L2 core biopsy, RFA and kyphoplasty under moderate sedation with Versed and fentanyl -Patient had a complicated biopsy but is improved and awaiting pathology results.  He is medically stable to be discharged and PT OT recommending  SNF -I discussed the case with radiation oncology and now that his biopsy is completed and they are hoping that he can be discharged to skilled nursing facility or home with his sister and can move forward with getting a PET scan to complete his work-up in the outpatient setting with plans to bring him back in 10 to 14 days for radiation treatment planning.  He appears medically stable for discharge at this time however does not have a safe discharge disposition given that PT is recommending SNF and he is homeless. TOC assisting with disposition.   Severe protein calorie malnutrition suspected due to malignancy -Patient states in April that his weight was 172 pounds and reports that he believes his weight has decreased to 127 pounds. -His laboratory data reveals a slightly elevated anion gap and low CO2 probably from hyperglycemia noting glucose is 314 but there is probably a component of starvation ketosis as well -Based on reported weight of 127 pounds BMI would be 19.3 which has decreased from a previous BMI of 20.98 -We will consult nutrition and appreciate their assistance -Nutrition Status: Nutrition Problem: Increased nutrient needs Etiology: chronic illness Signs/Symptoms: estimated needs Interventions: Glucerna shake, MVI, Education  Uncontrolled diabetes mellitus type 2 associated with hyperglycemia  -Current hemoglobin A1c 13, presented with significant glycosuria and ketones in urine with mild elevation in anion gap -Continue Lantus -Receive IV contrast so will hold metformin for now -Continue to monitor CBGs per protocol; CBGs ranging from 75-213   Hypomagnesemia -Mag level is now 1.8 -Replete with IV mag sulfate 2 g -Continue monitor and replete as necessary -Repeat mag level in a.m.   Hypokalemia -Repleted and improved -Patient's potassium is now 4.6 -Continue to monitor replete as necessary -Repeat CMP in a.m.   Depression and Anxiety -Continue Lexapro and  Trazodone   HLD -Continue Lipitor noting that LFTs are normal -AST is now 26 and ALT is now 27   Hypoalbuminemia -Patient's albumin level has gone from 2.5 -> 2.4 -> 2.7 and is now 2.4 -Continue to monitor and trend and repeat CMP in a.m.   Normocytic Anemia -Patient's hemoglobin/hematocrit went from 12.4/36.0 -> 11.5/32.6 -> 11.2/31.8 -> 12.6/36.4 and is now 10.8/32.1 -Checked anemia panel and showed an iron level of 77, U IBC of 99, TIBC 176, saturation ratios of 44%, ferritin level 153, folate level 10.5, vitamin B12 level 783 -Continue to monitor for signs and symptoms bleeding; no overt bleeding noted -Repeat CBC in a.m.  Thrombocytopenia -The patient's platelet count went from 150 and trended down to 146 -Continue to monitor for signs and symptoms bleeding; no overt bleeding noted   DVT prophylaxis: enoxaparin (LOVENOX) injection 40 mg Start: 08/02/22 1000 SCDs Start: 07/29/22 0412    Code Status: Full Code Family Communication: No family currently at bedside  Disposition Plan:  Level of care: Telemetry Medical Status is: Inpatient Remains inpatient appropriate because: Needs a safe discharge disposition and PT OT recommending SNF and will need insurance authorization and bed availability   Consultants:  Interventional radiology Radiation oncology  Procedures:  As delineated as above   FLUOROSCOPY GUIDED L2 CORE BIOPSY, RFA AND KYPHOPLASTY  Antimicrobials:  Anti-infectives (From admission, onward)    Start     Dose/Rate Route Frequency Ordered Stop   08/01/22 0900  vancomycin (VANCOCIN) IVPB 1000 mg/200 mL premix        1,000 mg 200 mL/hr over 60 Minutes Intravenous To Radiology 08/01/22 0803 08/01/22 1155       Subjective: Patient examined at bedside and he was resting and states that he had a bowel movement last night.  States that he is able to stand little bit straighter and improving and states the pain is not as bad.  States that he continues to still  have some right thigh numbness but is getting better.  He has been ambulating and he has a brace on currently.  Denies any other concerns or complaints at this time.  Objective: Vitals:   08/01/22 2009 08/02/22 0039 08/02/22 0803 08/02/22 1150  BP: (!) 88/60 96/64 107/70 (!) 90/55  Pulse: 98  96 81  Resp: 19 16 16 16   Temp: 98.2 F (36.8 C) 98.7 F (37.1 C) 98.4 F (36.9 C) 98.4 F (36.9 C)  TempSrc: Oral Oral Oral Oral  SpO2: 96% 94% 95% 96%  Weight:      Height:        Intake/Output Summary (Last 24 hours) at 08/02/2022 1440 Last data filed at 08/02/2022 0200 Gross per 24 hour  Intake 877 ml  Output 790 ml  Net 87 ml   Filed Weights   07/30/22 0434 07/31/22 0445 08/01/22 0425  Weight: 59.4 kg 64.4 kg 64.6 kg  Examination: Physical Exam:  Constitutional: WN/WD thin African-American male currently no acute distress appears calm Respiratory: Diminished to auscultation bilaterally, no wheezing, rales, rhonchi or crackles. Normal respiratory effort and patient is not tachypenic. No accessory muscle use.  Unlabored breathing Cardiovascular: RRR, no murmurs / rubs / gallops. S1 and S2 auscultated. No extremity edema.  Abdomen: Soft, non-tender, non-distended. Bowel sounds positive.  GU: Deferred. Musculoskeletal: No clubbing / cyanosis of digits/nails. No joint deformity upper and lower extremities.   Skin: No rashes, lesions, ulcers on limited skin evaluation. No induration; Warm and dry.  Neurologic: CN 2-12 grossly intact with no focal deficits.  Romberg sign and cerebellar reflexes not assessed.  Psychiatric: Normal judgment and insight. Alert and oriented x 3. Normal mood and appropriate affect.   Data Reviewed: I have personally reviewed following labs and imaging studies  CBC: Recent Labs  Lab 07/29/22 0615 07/30/22 0651 07/31/22 0206 08/01/22 0818 08/02/22 0309  WBC 6.7 5.7 7.3 8.3 8.8  NEUTROABS 3.7 3.3 5.0 6.3 6.6  HGB 12.4* 11.5* 11.2* 12.6* 10.8*  HCT  36.0* 32.6* 31.8* 36.4* 32.1*  MCV 94.7 90.8 91.6 93.6 94.1  PLT 190 173 149* 153 086*   Basic Metabolic Panel: Recent Labs  Lab 07/29/22 0615 07/30/22 0651 07/31/22 0206 08/01/22 0818 08/02/22 0309  NA 137 137 137 136 136  K 3.4* 3.5 4.1 4.4 4.6  CL 104 106 102 101 101  CO2 20* 26 29 28 30   GLUCOSE 245* 124* 150* 145* 111*  BUN 13 9 13 15 16   CREATININE 0.89 0.49* 0.37* 0.51* 0.64  CALCIUM 9.0 8.7* 8.8* 8.9 9.1  MG 1.5* 1.6* 1.8 1.9 1.8  PHOS  --  3.0 3.3 4.2 4.6   GFR: Estimated Creatinine Clearance: 92 mL/min (by C-G formula based on SCr of 0.64 mg/dL). Liver Function Tests: Recent Labs  Lab 07/29/22 0615 07/30/22 0651 07/31/22 0206 08/01/22 0818 08/02/22 0309  AST 7* 13* 30 38 26  ALT 8 9 21  37 27  ALKPHOS 71 62 67 76 59  BILITOT 0.8 0.1* 0.4 0.2* 0.3  PROT 5.6* 5.1* 5.0* 5.7* 5.3*  ALBUMIN 2.8* 2.5* 2.4* 2.7* 2.4*   No results for input(s): "LIPASE", "AMYLASE" in the last 168 hours. No results for input(s): "AMMONIA" in the last 168 hours. Coagulation Profile: Recent Labs  Lab 07/29/22 1016  INR 1.1   Cardiac Enzymes: No results for input(s): "CKTOTAL", "CKMB", "CKMBINDEX", "TROPONINI" in the last 168 hours. BNP (last 3 results) No results for input(s): "PROBNP" in the last 8760 hours. HbA1C: No results for input(s): "HGBA1C" in the last 72 hours. CBG: Recent Labs  Lab 08/01/22 0626 08/01/22 1410 08/01/22 1636 08/02/22 0648 08/02/22 1209  GLUCAP 132* 119* 213* 75 206*   Lipid Profile: No results for input(s): "CHOL", "HDL", "LDLCALC", "TRIG", "CHOLHDL", "LDLDIRECT" in the last 72 hours. Thyroid Function Tests: No results for input(s): "TSH", "T4TOTAL", "FREET4", "T3FREE", "THYROIDAB" in the last 72 hours. Anemia Panel: No results for input(s): "VITAMINB12", "FOLATE", "FERRITIN", "TIBC", "IRON", "RETICCTPCT" in the last 72 hours. Sepsis Labs: No results for input(s): "PROCALCITON", "LATICACIDVEN" in the last 168 hours.  No results found for  this or any previous visit (from the past 240 hour(s)).   Radiology Studies: IR KYPHO LUMBAR INC FX REDUCE BONE BX UNI/BIL CANNULATION INC/IMAGING  Result Date: 08/01/2022 INDICATION: Samuel [name] is a 56 y.o. male with a past medical history significant for pancreatitis and diabetes mellitus. Patient reports back pain and weight loss for approximately 4  months. He presented to ED in May due to back pain and a CT of the abdomen showed a lytic L2 vertebral body lesion as well as an L4 spinous process lesion without intra-abdominal malignancy. A chest CT performed June 28, 2022 showed no definitive evidence of malignancy. Due to worsening back pain, patient again presented to ED on 07/17/2022. CT of the lumbar spine showed worsening presumed pathologic fracture of the L2 vertebral body with small retropulsion, extension into the posterior elements and more than 50% height loss, new lytic lesion in the L3 right pedicle and progression of the lytic lesion in the L4 spinous process, now involving the lamina bilaterally. Patient has persistent back pain severely limiting quality of life. During office visit on 07/22/2022, he rated the pain 12/10 when he wakes up with some improvement after he takes medication when it lowers to a more tolerable degree, with worsening to 7-8/10 when moving. The pain radiates to the right leg and severely limits his ability to walk. On 07/29/2022, he presented again to ED and was admitted due to worsening pain and weakness. He comes to our service today for fluoroscopy guided L2 core biopsy, RFA and kyphoplasty. EXAM: FLUOROSCOPY GUIDED L2 CORE BONE BIOPSY, RADIOFREQUENCY ABLATION AND BALLOON KYPHOPLASTY COMPARISON:  MRI of the lumbar spine September 2023. MEDICATIONS: Vancomycin 1 g IV; The antibiotic was administered in an appropriate time interval prior to needle puncture of the skin. ANESTHESIA/SEDATION: Total intra-service Moderate Sedation Time: 60 minutes. A total of 2.5  mg of Versed IV and 150 mcg of fentanyl IV were administered by the Radiology nurse monitored the patients level of consciousness and vital signs continuously throughout the procedure under my direct supervision. FLUOROSCOPY: Radiation Exposure Index (as provided by the fluoroscopic device): 1,962 mGy Kerma COMPLICATIONS: None immediate. TECHNIQUE: Informed written consent was obtained from the patient after a thorough discussion of the procedural risks, benefits and alternatives. All questions were addressed. Maximal Sterile Barrier Technique was utilized including caps, mask, sterile gowns, sterile gloves, sterile drape, hand hygiene and skin antiseptic. A timeout was performed prior to the initiation of the procedure. The patient was placed in prone position on the angiography table. The lumbar region was prepped and draped in a sterile fashion. Under fluoroscopy, the L2 vertebral body was delineated and the skin area was marked. The skin was infiltrated with a 1% Lidocaine approximately 5 cm lateral to the spinous process projection on the right. Using a 22-gauge spinal needle, the soft issue and the peripedicular space and periosteum were infiltrated with Bupivacaine 0.5%. A skin incision was made at the access site. Subsequently, an 11-gauge Kyphon trocar was inserted under fluoroscopic guidance until contact with the pedicle was obtained. The trocar was inserted into the pedicle with light hammer tapping until the posterior boundaries of the vertebral body was reached. The diamond mandrill was removed and one core biopsy was obtained. The skin was infiltrated with a 1% Lidocaine approximately 4 cm lateral to the spinous process projection on the left. Using a 22-gauge spinal needle, the soft issue and the peripedicular space and periosteum were infiltrated with Bupivacaine 0.5%. A skin incision was made at the access site. Subsequently, an 11-gauge Kyphon trocar was inserted under fluoroscopic guidance until  contact with the pedicle was obtained. The trocar was inserted with light hammer tapping into the pedicle until the posterior boundaries of the vertebral body was reached. A bone drill was coaxially advanced within the anterior third of the vertebral body on each side for  proper RF probes size selection. An OsteoCool RF probe was inserted bilaterally within the mid-posterior third of the vertebral body. The radiofrequency ablation cycle was performed. Thereafter, the RF probes were exchanged for inflatable Kyphon balloons, which were centered within the mid-aspect of the vertebral body. The balloons were inflated to create a void to serve as a repository for the bone cement. Both balloons were deflated and through both cannulas, under continuous fluoroscopy guidance in the AP and lateral views, the vertebral body was filled with previously mixed polymethyl-methacrylate (PMMA) added to barium for opacification. Both cannulas were later removed. Post procedural radiographed shows evidence of significant cement extravasation. FINDINGS: Pathologic compression fracture of the L2 vertebral body with compromise of the right lateral wall and right pedicle. IMPRESSION: Successful L2 core biopsy followed by radiofrequency ablation and balloon kyphoplasty. Electronically Signed   By: Pedro Earls M.D.   On: 08/01/2022 14:48   IR Bone Tumor(s)RF Ablation  Result Date: 08/01/2022 INDICATION: Samuel [name] is a 56 y.o. male with a past medical history significant for pancreatitis and diabetes mellitus. Patient reports back pain and weight loss for approximately 4 months. He presented to ED in May due to back pain and a CT of the abdomen showed a lytic L2 vertebral body lesion as well as an L4 spinous process lesion without intra-abdominal malignancy. A chest CT performed June 28, 2022 showed no definitive evidence of malignancy. Due to worsening back pain, patient again presented to ED on 07/17/2022. CT  of the lumbar spine showed worsening presumed pathologic fracture of the L2 vertebral body with small retropulsion, extension into the posterior elements and more than 50% height loss, new lytic lesion in the L3 right pedicle and progression of the lytic lesion in the L4 spinous process, now involving the lamina bilaterally. Patient has persistent back pain severely limiting quality of life. During office visit on 07/22/2022, he rated the pain 12/10 when he wakes up with some improvement after he takes medication when it lowers to a more tolerable degree, with worsening to 7-8/10 when moving. The pain radiates to the right leg and severely limits his ability to walk. On 07/29/2022, he presented again to ED and was admitted due to worsening pain and weakness. He comes to our service today for fluoroscopy guided L2 core biopsy, RFA and kyphoplasty. EXAM: FLUOROSCOPY GUIDED L2 CORE BONE BIOPSY, RADIOFREQUENCY ABLATION AND BALLOON KYPHOPLASTY COMPARISON:  MRI of the lumbar spine September 2023. MEDICATIONS: Vancomycin 1 g IV; The antibiotic was administered in an appropriate time interval prior to needle puncture of the skin. ANESTHESIA/SEDATION: Total intra-service Moderate Sedation Time: 60 minutes. A total of 2.5 mg of Versed IV and 150 mcg of fentanyl IV were administered by the Radiology nurse monitored the patients level of consciousness and vital signs continuously throughout the procedure under my direct supervision. FLUOROSCOPY: Radiation Exposure Index (as provided by the fluoroscopic device): 0,102 mGy Kerma COMPLICATIONS: None immediate. TECHNIQUE: Informed written consent was obtained from the patient after a thorough discussion of the procedural risks, benefits and alternatives. All questions were addressed. Maximal Sterile Barrier Technique was utilized including caps, mask, sterile gowns, sterile gloves, sterile drape, hand hygiene and skin antiseptic. A timeout was performed prior to the initiation of  the procedure. The patient was placed in prone position on the angiography table. The lumbar region was prepped and draped in a sterile fashion. Under fluoroscopy, the L2 vertebral body was delineated and the skin area was marked. The skin was  infiltrated with a 1% Lidocaine approximately 5 cm lateral to the spinous process projection on the right. Using a 22-gauge spinal needle, the soft issue and the peripedicular space and periosteum were infiltrated with Bupivacaine 0.5%. A skin incision was made at the access site. Subsequently, an 11-gauge Kyphon trocar was inserted under fluoroscopic guidance until contact with the pedicle was obtained. The trocar was inserted into the pedicle with light hammer tapping until the posterior boundaries of the vertebral body was reached. The diamond mandrill was removed and one core biopsy was obtained. The skin was infiltrated with a 1% Lidocaine approximately 4 cm lateral to the spinous process projection on the left. Using a 22-gauge spinal needle, the soft issue and the peripedicular space and periosteum were infiltrated with Bupivacaine 0.5%. A skin incision was made at the access site. Subsequently, an 11-gauge Kyphon trocar was inserted under fluoroscopic guidance until contact with the pedicle was obtained. The trocar was inserted with light hammer tapping into the pedicle until the posterior boundaries of the vertebral body was reached. A bone drill was coaxially advanced within the anterior third of the vertebral body on each side for proper RF probes size selection. An OsteoCool RF probe was inserted bilaterally within the mid-posterior third of the vertebral body. The radiofrequency ablation cycle was performed. Thereafter, the RF probes were exchanged for inflatable Kyphon balloons, which were centered within the mid-aspect of the vertebral body. The balloons were inflated to create a void to serve as a repository for the bone cement. Both balloons were deflated and  through both cannulas, under continuous fluoroscopy guidance in the AP and lateral views, the vertebral body was filled with previously mixed polymethyl-methacrylate (PMMA) added to barium for opacification. Both cannulas were later removed. Post procedural radiographed shows evidence of significant cement extravasation. FINDINGS: Pathologic compression fracture of the L2 vertebral body with compromise of the right lateral wall and right pedicle. IMPRESSION: Successful L2 core biopsy followed by radiofrequency ablation and balloon kyphoplasty. Electronically Signed   By: Pedro Earls M.D.   On: 08/01/2022 14:48    Scheduled Meds:  atorvastatin  40 mg Oral Daily   docusate sodium  100 mg Oral BID   enoxaparin (LOVENOX) injection  40 mg Subcutaneous Daily   escitalopram  20 mg Oral Daily   feeding supplement (GLUCERNA SHAKE)  237 mL Oral TID BM   gabapentin  300 mg Oral TID   insulin aspart  0-15 Units Subcutaneous TID WC   insulin glargine-yfgn  32 Units Subcutaneous Daily   ketorolac  15 mg Intravenous Q6H   lidocaine  1 patch Transdermal Q24H   morphine  15 mg Oral Q12H   multivitamin with minerals  1 tablet Oral Daily   polyethylene glycol  17 g Oral QHS   traZODone  50 mg Oral QHS   Continuous Infusions:   LOS: 4 days   Raiford Noble, DO [facility] Available via Epic secure chat 7am-7pm After these hours, please refer to coverage provider listed on [email or web address] 08/02/2022, 2:40 PM

## 2022-08-02 NOTE — Progress Notes (Signed)
Physical Therapy Treatment Patient Details Name: Samuel [name] MRN: [id number] DOB: Jun 17, 1966 Today's Date: 08/02/2022   History of Present Illness 56 yo male with onset of intractible back pain with spine mets (unsure of primary per hx) was admitted on 9/7, now has plan for repletion of electrolytes, pain management and has pending plan for biopsy of lumbar spine and potential kyphoplasty.  Pain is in R hip and leg, low back and upper back at times.  Imaging has found mult thoracic spine spots that may be mets.  PMHx:  DM, pancreatitis, smoker, R rib and clavicle fractures,    PT Comments    Pt progressing towards all goals. Pt with improved pain s/p kyphoplasty yesterday. Pt continues with bilat LE weakness requiring increased dependence on UEs during ambulation. Pt to benefit from SNF upon d/c to achieve safe mod I level of function to minimize fall risk and for safe transition home with sister. Acute PT to cont to follow.   Recommendations for follow up therapy are one component of a multi-disciplinary discharge planning process, led by the attending physician.  Recommendations may be updated based on patient status, additional functional criteria and insurance authorization.  Follow Up Recommendations  Skilled nursing-short term rehab (<3 hours/day) Can patient physically be transported by private vehicle: No   Assistance Recommended at Discharge Frequent or constant Supervision/Assistance  Patient can return home with the following Two people to help with walking and/or transfers;A lot of help with bathing/dressing/bathroom;Assistance with cooking/housework;Assist for transportation;Help with stairs or ramp for entrance   Equipment Recommendations  Rolling walker (2 wheels)    Recommendations for Other Services       Precautions / Restrictions Precautions Precautions: Fall;Back Precaution Booklet Issued: No Precaution Comments: verbally reviewed back precautions, has  already received information Required Braces or Orthoses: Spinal Brace Spinal Brace: Thoracolumbosacral orthotic;Applied in sitting position Restrictions Weight Bearing Restrictions: No     Mobility  Bed Mobility Overal bed mobility: Needs Assistance Bed Mobility: Supine to Sit, Rolling Rolling: Min guard   Supine to sit: Min assist     General bed mobility comments: verbal cues for log roll technique, minA for trunk elevation, incresaed time, labored effort due to pain but able to complete    Transfers Overall transfer level: Needs assistance Equipment used: Rolling walker (2 wheels) Transfers: Sit to/from Stand Sit to Stand: Min assist           General transfer comment: minA to power up, bed slightly elevated, verbal cues for safe hand placement to push up from bed then transition to walker    Ambulation/Gait Ambulation/Gait assistance: Min assist Gait Distance (Feet): 40 Feet (x1, 100'x1) Assistive device: Rolling walker (2 wheels) Gait Pattern/deviations: Step-through pattern, Decreased stride length Gait velocity: dec Gait velocity interpretation: <1.31 ft/sec, indicative of household ambulator   General Gait Details: increased UE dependence on RW, pt with onset of back pain requiring seated rest break, pt with improved management during second bout   Stairs             Wheelchair Mobility    Modified Rankin (Stroke Patients Only)       Balance Overall balance assessment: Needs assistance Sitting-balance support: Feet supported Sitting balance-Leahy Scale: Fair Sitting balance - Comments: reliant on UEs to offset back pain Postural control: Posterior lean Standing balance support: Bilateral upper extremity supported, During functional activity Standing balance-Leahy Scale: Poor Standing balance comment: dependent on RW  Cognition Arousal/Alertness: Awake/alert Behavior During Therapy: WFL for tasks  assessed/performed Overall Cognitive Status: Within Functional Limits for tasks assessed                                 General Comments: pt motivated and eager to get back to walking, felt better now that he had a BM yesterday        Exercises      General Comments General comments (skin integrity, edema, etc.): VSS      Pertinent Vitals/Pain Pain Assessment Pain Assessment: 0-10 Pain Score: 6  (increased to 9/10 at end of amb however diminishes upon sitting) Pain Location: back and RLE upon standing Pain Descriptors / Indicators: Grimacing, Aching, Spasm, Sharp Pain Intervention(s): Monitored during session    Home Living                          Prior Function            PT Goals (current goals can now be found in the care plan section) Acute Rehab PT Goals Patient Stated Goal: to walk and get pain managed PT Goal Formulation: With patient Time For Goal Achievement: 08/13/22 Potential to Achieve Goals: Good Progress towards PT goals: Progressing toward goals    Frequency    Min 3X/week      PT Plan Current plan remains appropriate    Co-evaluation              AM-PAC PT "6 Clicks" Mobility   Outcome Measure  Help needed turning from your back to your side while in a flat bed without using bedrails?: A Little Help needed moving from lying on your back to sitting on the side of a flat bed without using bedrails?: A Little Help needed moving to and from a bed to a chair (including a wheelchair)?: A Lot Help needed standing up from a chair using your arms (e.g., wheelchair or bedside chair)?: A Lot Help needed to walk in hospital room?: A Lot Help needed climbing 3-5 steps with a railing? : Total 6 Click Score: 13    End of Session Equipment Utilized During Treatment: Gait belt Activity Tolerance: Treatment limited secondary to medical complications (Comment);Patient limited by pain Patient left: with call bell/phone within  reach;in chair Nurse Communication: Mobility status PT Visit Diagnosis: Unsteadiness on feet (R26.81);Pain;Difficulty in walking, not elsewhere classified (R26.2) Pain - Right/Left: Right Pain - part of body: Hip;Knee;Leg     Time: 7741-4239 PT Time Calculation (min) (ACUTE ONLY): 34 min  Charges:  $Gait Training: 8-22 mins $Therapeutic Activity: 8-22 mins                     Kittie Plater, PT, DPT Acute Rehabilitation Services Secure chat preferred Office #: [phone]    Berline Lopes 08/02/2022, 1:44 PM

## 2022-08-02 NOTE — Progress Notes (Signed)
Referring Physician(s): Dr Claybon Jabs  Supervising Physician: Pedro Earls  Patient Status:  Samuel [name] - In-pt  Chief Complaint:  Back pain  Subjective:  FLUOROSCOPY GUIDED L2 CORE BIOPSY, RFA AND KYPHOPLASTY in IR 9/11 Pt is doing better already today Able to stand with walker and walk to restroom last night Able to move in bed better and less pain Feels more "stable" in back  Allergies: Penicillins and Percocet [oxycodone-acetaminophen]  Medications: Prior to Admission medications   Medication Sig Start Date End Date Taking? Authorizing Provider  acetaminophen (TYLENOL) 500 MG tablet Take 1,000 mg by mouth every 6 (six) hours as needed for mild pain or moderate pain.   Yes [provider]  oxyCODONE (ROXICODONE) 5 MG immediate release tablet Take 1 tablet (5 mg total) by mouth every 6 (six) hours as needed for up to 60 doses (For Cancer Pain in the Spine). 07/22/22  Yes Tyler Pita, MD  cyclobenzaprine (FLEXERIL) 10 MG tablet Take 1 tablet (10 mg total) by mouth 2 (two) times daily as needed for up to 20 doses for muscle spasms. Patient not taking: Reported on 07/29/2022 07/17/22   Wyvonnia Dusky, MD  methylPREDNISolone (MEDROL DOSEPAK) 4 MG TBPK tablet Take as directed on package Patient not taking: Reported on 07/29/2022 07/17/22   Wyvonnia Dusky, MD     Vital Signs: BP 96/64 (BP Location: Right Arm)   Pulse 98   Temp 98.7 F (37.1 C) (Oral)   Resp 16   Ht 5\' 8"  (1.727 m)   Wt 142 lb 6.7 oz (64.6 kg)   SpO2 94%   BMI 21.65 kg/m   Physical Exam Musculoskeletal:        General: Normal range of motion.  Skin:    General: Skin is warm.     Comments: Site is clean and dry NT small blood on Rt bandage- less than 1 cm Left bandage dry       Imaging: IR KYPHO LUMBAR INC FX REDUCE BONE BX UNI/BIL CANNULATION INC/IMAGING  Result Date: 08/01/2022 INDICATION: Samuel [name] is a 56 y.o. male with a past medical history significant  for pancreatitis and diabetes mellitus. Patient reports back pain and weight loss for approximately 4 months. He presented to ED in May due to back pain and a CT of the abdomen showed a lytic L2 vertebral body lesion as well as an L4 spinous process lesion without intra-abdominal malignancy. A chest CT performed June 28, 2022 showed no definitive evidence of malignancy. Due to worsening back pain, patient again presented to ED on 07/17/2022. CT of the lumbar spine showed worsening presumed pathologic fracture of the L2 vertebral body with small retropulsion, extension into the posterior elements and more than 50% height loss, new lytic lesion in the L3 right pedicle and progression of the lytic lesion in the L4 spinous process, now involving the lamina bilaterally. Patient has persistent back pain severely limiting quality of life. During office visit on 07/22/2022, he rated the pain 12/10 when he wakes up with some improvement after he takes medication when it lowers to a more tolerable degree, with worsening to 7-8/10 when moving. The pain radiates to the right leg and severely limits his ability to walk. On 07/29/2022, he presented again to ED and was admitted due to worsening pain and weakness. He comes to our service today for fluoroscopy guided L2 core biopsy, RFA and kyphoplasty. EXAM: FLUOROSCOPY GUIDED L2 CORE BONE BIOPSY, RADIOFREQUENCY ABLATION AND BALLOON  KYPHOPLASTY COMPARISON:  MRI of the lumbar spine September 2023. MEDICATIONS: Vancomycin 1 g IV; The antibiotic was administered in an appropriate time interval prior to needle puncture of the skin. ANESTHESIA/SEDATION: Total intra-service Moderate Sedation Time: 60 minutes. A total of 2.5 mg of Versed IV and 150 mcg of fentanyl IV were administered by the Radiology nurse monitored the patients level of consciousness and vital signs continuously throughout the procedure under my direct supervision. FLUOROSCOPY: Radiation Exposure Index (as provided by  the fluoroscopic device): 3,614 mGy Kerma COMPLICATIONS: None immediate. TECHNIQUE: Informed written consent was obtained from the patient after a thorough discussion of the procedural risks, benefits and alternatives. All questions were addressed. Maximal Sterile Barrier Technique was utilized including caps, mask, sterile gowns, sterile gloves, sterile drape, hand hygiene and skin antiseptic. A timeout was performed prior to the initiation of the procedure. The patient was placed in prone position on the angiography table. The lumbar region was prepped and draped in a sterile fashion. Under fluoroscopy, the L2 vertebral body was delineated and the skin area was marked. The skin was infiltrated with a 1% Lidocaine approximately 5 cm lateral to the spinous process projection on the right. Using a 22-gauge spinal needle, the soft issue and the peripedicular space and periosteum were infiltrated with Bupivacaine 0.5%. A skin incision was made at the access site. Subsequently, an 11-gauge Kyphon trocar was inserted under fluoroscopic guidance until contact with the pedicle was obtained. The trocar was inserted into the pedicle with light hammer tapping until the posterior boundaries of the vertebral body was reached. The diamond mandrill was removed and one core biopsy was obtained. The skin was infiltrated with a 1% Lidocaine approximately 4 cm lateral to the spinous process projection on the left. Using a 22-gauge spinal needle, the soft issue and the peripedicular space and periosteum were infiltrated with Bupivacaine 0.5%. A skin incision was made at the access site. Subsequently, an 11-gauge Kyphon trocar was inserted under fluoroscopic guidance until contact with the pedicle was obtained. The trocar was inserted with light hammer tapping into the pedicle until the posterior boundaries of the vertebral body was reached. A bone drill was coaxially advanced within the anterior third of the vertebral body on each  side for proper RF probes size selection. An OsteoCool RF probe was inserted bilaterally within the mid-posterior third of the vertebral body. The radiofrequency ablation cycle was performed. Thereafter, the RF probes were exchanged for inflatable Kyphon balloons, which were centered within the mid-aspect of the vertebral body. The balloons were inflated to create a void to serve as a repository for the bone cement. Both balloons were deflated and through both cannulas, under continuous fluoroscopy guidance in the AP and lateral views, the vertebral body was filled with previously mixed polymethyl-methacrylate (PMMA) added to barium for opacification. Both cannulas were later removed. Post procedural radiographed shows evidence of significant cement extravasation. FINDINGS: Pathologic compression fracture of the L2 vertebral body with compromise of the right lateral wall and right pedicle. IMPRESSION: Successful L2 core biopsy followed by radiofrequency ablation and balloon kyphoplasty. Electronically Signed   By: Pedro Earls M.D.   On: 08/01/2022 14:48   IR Bone Tumor(s)RF Ablation  Result Date: 08/01/2022 INDICATION: Samuel [name] is a 56 y.o. male with a past medical history significant for pancreatitis and diabetes mellitus. Patient reports back pain and weight loss for approximately 4 months. He presented to ED in May due to back pain and a CT of the  abdomen showed a lytic L2 vertebral body lesion as well as an L4 spinous process lesion without intra-abdominal malignancy. A chest CT performed June 28, 2022 showed no definitive evidence of malignancy. Due to worsening back pain, patient again presented to ED on 07/17/2022. CT of the lumbar spine showed worsening presumed pathologic fracture of the L2 vertebral body with small retropulsion, extension into the posterior elements and more than 50% height loss, new lytic lesion in the L3 right pedicle and progression of the lytic lesion  in the L4 spinous process, now involving the lamina bilaterally. Patient has persistent back pain severely limiting quality of life. During office visit on 07/22/2022, he rated the pain 12/10 when he wakes up with some improvement after he takes medication when it lowers to a more tolerable degree, with worsening to 7-8/10 when moving. The pain radiates to the right leg and severely limits his ability to walk. On 07/29/2022, he presented again to ED and was admitted due to worsening pain and weakness. He comes to our service today for fluoroscopy guided L2 core biopsy, RFA and kyphoplasty. EXAM: FLUOROSCOPY GUIDED L2 CORE BONE BIOPSY, RADIOFREQUENCY ABLATION AND BALLOON KYPHOPLASTY COMPARISON:  MRI of the lumbar spine September 2023. MEDICATIONS: Vancomycin 1 g IV; The antibiotic was administered in an appropriate time interval prior to needle puncture of the skin. ANESTHESIA/SEDATION: Total intra-service Moderate Sedation Time: 60 minutes. A total of 2.5 mg of Versed IV and 150 mcg of fentanyl IV were administered by the Radiology nurse monitored the patients level of consciousness and vital signs continuously throughout the procedure under my direct supervision. FLUOROSCOPY: Radiation Exposure Index (as provided by the fluoroscopic device): 4,098 mGy Kerma COMPLICATIONS: None immediate. TECHNIQUE: Informed written consent was obtained from the patient after a thorough discussion of the procedural risks, benefits and alternatives. All questions were addressed. Maximal Sterile Barrier Technique was utilized including caps, mask, sterile gowns, sterile gloves, sterile drape, hand hygiene and skin antiseptic. A timeout was performed prior to the initiation of the procedure. The patient was placed in prone position on the angiography table. The lumbar region was prepped and draped in a sterile fashion. Under fluoroscopy, the L2 vertebral body was delineated and the skin area was marked. The skin was infiltrated with a  1% Lidocaine approximately 5 cm lateral to the spinous process projection on the right. Using a 22-gauge spinal needle, the soft issue and the peripedicular space and periosteum were infiltrated with Bupivacaine 0.5%. A skin incision was made at the access site. Subsequently, an 11-gauge Kyphon trocar was inserted under fluoroscopic guidance until contact with the pedicle was obtained. The trocar was inserted into the pedicle with light hammer tapping until the posterior boundaries of the vertebral body was reached. The diamond mandrill was removed and one core biopsy was obtained. The skin was infiltrated with a 1% Lidocaine approximately 4 cm lateral to the spinous process projection on the left. Using a 22-gauge spinal needle, the soft issue and the peripedicular space and periosteum were infiltrated with Bupivacaine 0.5%. A skin incision was made at the access site. Subsequently, an 11-gauge Kyphon trocar was inserted under fluoroscopic guidance until contact with the pedicle was obtained. The trocar was inserted with light hammer tapping into the pedicle until the posterior boundaries of the vertebral body was reached. A bone drill was coaxially advanced within the anterior third of the vertebral body on each side for proper RF probes size selection. An OsteoCool RF probe was inserted bilaterally within the mid-posterior third  of the vertebral body. The radiofrequency ablation cycle was performed. Thereafter, the RF probes were exchanged for inflatable Kyphon balloons, which were centered within the mid-aspect of the vertebral body. The balloons were inflated to create a void to serve as a repository for the bone cement. Both balloons were deflated and through both cannulas, under continuous fluoroscopy guidance in the AP and lateral views, the vertebral body was filled with previously mixed polymethyl-methacrylate (PMMA) added to barium for opacification. Both cannulas were later removed. Post procedural  radiographed shows evidence of significant cement extravasation. FINDINGS: Pathologic compression fracture of the L2 vertebral body with compromise of the right lateral wall and right pedicle. IMPRESSION: Successful L2 core biopsy followed by radiofrequency ablation and balloon kyphoplasty. Electronically Signed   By: Pedro Earls M.D.   On: 08/01/2022 14:48    Labs:  CBC: Recent Labs    07/30/22 0651 07/31/22 0206 08/01/22 0818 08/02/22 0309  WBC 5.7 7.3 8.3 8.8  HGB 11.5* 11.2* 12.6* 10.8*  HCT 32.6* 31.8* 36.4* 32.1*  PLT 173 149* 153 146*    COAGS: Recent Labs    07/29/22 1016  INR 1.1    BMP: Recent Labs    07/30/22 0651 07/31/22 0206 08/01/22 0818 08/02/22 0309  NA 137 137 136 136  K 3.5 4.1 4.4 4.6  CL 106 102 101 101  CO2 26 29 28 30   GLUCOSE 124* 150* 145* 111*  BUN 9 13 15 16   CALCIUM 8.7* 8.8* 8.9 9.1  CREATININE 0.49* 0.37* 0.51* 0.64  GFRNONAA >60 >60 >60 >60    LIVER FUNCTION TESTS: Recent Labs    07/30/22 0651 07/31/22 0206 08/01/22 0818 08/02/22 0309  BILITOT 0.1* 0.4 0.2* 0.3  AST 13* 30 38 26  ALT 9 21 37 27  ALKPHOS 62 67 76 59  PROT 5.1* 5.0* 5.7* 5.3*  ALBUMIN 2.5* 2.4* 2.7* 2.4*    Assessment and Plan:  L2 KP; RFA in IR 9/11 Doing well; less pain already Feels better   Electronically Signed: Lavonia Drafts, PA-C 08/02/2022, 7:02 AM   I spent a total of 15 Minutes at the the patient's bedside AND on the patient's [name] floor or unit, greater than 50% of which was counseling/coordinating care for L2 RFA/KP

## 2022-08-02 NOTE — Progress Notes (Signed)
Mobility Specialist: Progress Note   08/02/22 1500  Mobility  Activity Ambulated with assistance in hallway  Level of Assistance Contact guard assist, steadying assist  Assistive Device Front wheel walker  Distance Ambulated (ft) 300 ft (150'x2)  Activity Response Tolerated well  $Mobility charge 1 Mobility   During Mobility: 108 HR, 94% SpO2  Pt received in the bed and agreeable to mobility. Stopped x1 for seated break secondary to SOB and general fatigue. Pt to BR after walk and then back to bed per request. Pt has call bell and phone at his side.   [name] Mobility Specialist Mobility Specialist [address]: [phone]

## 2022-08-03 DIAGNOSIS — G893 Neoplasm related pain (acute) (chronic): Secondary | ICD-10-CM | POA: Diagnosis not present

## 2022-08-03 DIAGNOSIS — R627 Adult failure to thrive: Secondary | ICD-10-CM | POA: Diagnosis not present

## 2022-08-03 DIAGNOSIS — C801 Malignant (primary) neoplasm, unspecified: Secondary | ICD-10-CM

## 2022-08-03 DIAGNOSIS — R739 Hyperglycemia, unspecified: Secondary | ICD-10-CM | POA: Diagnosis not present

## 2022-08-03 DIAGNOSIS — Z794 Long term (current) use of insulin: Secondary | ICD-10-CM

## 2022-08-03 DIAGNOSIS — E1169 Type 2 diabetes mellitus with other specified complication: Secondary | ICD-10-CM | POA: Diagnosis not present

## 2022-08-03 DIAGNOSIS — F418 Other specified anxiety disorders: Secondary | ICD-10-CM

## 2022-08-03 DIAGNOSIS — E785 Hyperlipidemia, unspecified: Secondary | ICD-10-CM

## 2022-08-03 DIAGNOSIS — E43 Unspecified severe protein-calorie malnutrition: Secondary | ICD-10-CM

## 2022-08-03 DIAGNOSIS — E876 Hypokalemia: Secondary | ICD-10-CM

## 2022-08-03 DIAGNOSIS — C7951 Secondary malignant neoplasm of bone: Secondary | ICD-10-CM | POA: Diagnosis not present

## 2022-08-03 DIAGNOSIS — D508 Other iron deficiency anemias: Secondary | ICD-10-CM

## 2022-08-03 LAB — COMPREHENSIVE METABOLIC PANEL
ALT: 22 U/L (ref 0–44)
AST: 17 U/L (ref 15–41)
Albumin: 2.1 g/dL — ABNORMAL LOW (ref 3.5–5.0)
Alkaline Phosphatase: 55 U/L (ref 38–126)
Anion gap: 7 (ref 5–15)
BUN: 16 mg/dL (ref 6–20)
CO2: 27 mmol/L (ref 22–32)
Calcium: 8.4 mg/dL — ABNORMAL LOW (ref 8.9–10.3)
Chloride: 104 mmol/L (ref 98–111)
Creatinine, Ser: 0.4 mg/dL — ABNORMAL LOW (ref 0.61–1.24)
GFR, Estimated: >60 mL/min (ref >60)
Glucose, Bld: 150 mg/dL — ABNORMAL HIGH (ref 70–99)
Potassium: 3.8 mmol/L (ref 3.5–5.1)
Sodium: 138 mmol/L (ref 135–145)
Total Bilirubin: 0.3 mg/dL (ref 0.3–1.2)
Total Protein: 5.1 g/dL — ABNORMAL LOW (ref 6.5–8.1)

## 2022-08-03 LAB — CBC WITH DIFFERENTIAL/PLATELET
Abs Immature Granulocytes: 0.03 10*3/uL (ref 0.00–0.07)
Basophils Absolute: 0 10*3/uL (ref 0.0–0.1)
Basophils Relative: 0 %
Eosinophils Absolute: 0.1 10*3/uL (ref 0.0–0.5)
Eosinophils Relative: 1 %
HCT: 29.5 % — ABNORMAL LOW (ref 39.0–52.0)
Hemoglobin: 10.3 g/dL — ABNORMAL LOW (ref 13.0–17.0)
Immature Granulocytes: 0 %
Lymphocytes Relative: 17 %
Lymphs Abs: 1.4 10*3/uL (ref 0.7–4.0)
MCH: 32.8 pg (ref 26.0–34.0)
MCHC: 34.9 g/dL (ref 30.0–36.0)
MCV: 93.9 fL (ref 80.0–100.0)
Monocytes Absolute: 1 10*3/uL (ref 0.1–1.0)
Monocytes Relative: 12 %
Neutro Abs: 5.9 10*3/uL (ref 1.7–7.7)
Neutrophils Relative %: 70 %
Platelets: 159 10*3/uL (ref 150–400)
RBC: 3.14 MIL/uL — ABNORMAL LOW (ref 4.22–5.81)
RDW: 12.7 % (ref 11.5–15.5)
WBC: 8.4 10*3/uL (ref 4.0–10.5)
nRBC: 0 % (ref 0.0–0.2)

## 2022-08-03 LAB — GLUCOSE, CAPILLARY
Glucose-Capillary: 149 mg/dL — ABNORMAL HIGH (ref 70–99)
Glucose-Capillary: 101 mg/dL — ABNORMAL HIGH (ref 70–99)
Glucose-Capillary: 232 mg/dL — ABNORMAL HIGH (ref 70–99)
Glucose-Capillary: 128 mg/dL — ABNORMAL HIGH (ref 70–99)

## 2022-08-03 LAB — SURGICAL PATHOLOGY

## 2022-08-03 LAB — PHOSPHORUS: Phosphorus: 3.2 mg/dL (ref 2.5–4.6)

## 2022-08-03 LAB — MAGNESIUM: Magnesium: 1.9 mg/dL (ref 1.7–2.4)

## 2022-08-03 NOTE — Progress Notes (Signed)
Inpatient Rehab Admissions Coordinator:  ? ?Per therapy recommendations,  patient was screened for CIR candidacy by Donise Woodle, MS, CCC-SLP. At this time, Pt. Appears to be a a potential candidate for CIR. I will place   order for rehab consult per protocol for full assessment. Please contact me any with questions. ? ?Cathern Tahir, MS, CCC-SLP ?Rehab Admissions Coordinator  ?[phone] (celll) ?[phone] (office) ? ?

## 2022-08-03 NOTE — Progress Notes (Signed)
  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 --Continue interventions as per dietitian    Uncontrolled diabetes mellitus type 2 associated with hyperglycemia  --Hemoglobin A1c 13  --Stable, continue Lantus   Hypomagnesemia -- Resolved   Hypokalemia --Resolved   Depression and Anxiety --Continue Lexapro and Trazodone   Hyperlipidemia --Continue Lipitor   Normocytic Anemia --Stable.  Follow-up as an outpatient.     Subjective:  Feels ok, but has some pain in left leg and couldn't go as far with mobility tech today.  Physical Exam: Vitals:   08/03/22 0512 08/03/22 0731 08/03/22 1100 08/03/22 1500  BP: 98/62 106/68 110/78 120/78  Pulse: 87 89 85 87  Resp: 14 16 16 16   Temp: (!) 97.5 F (36.4 C) 98.4 F (36.9 C) 98.3 F (36.8 C) 98.2 F (36.8 C)  TempSrc: Oral Oral Oral Oral  SpO2: 100% 98% 98% 99%  Weight:      Height:       Physical Exam Vitals reviewed.  Constitutional:      General: He is not in acute distress.    Appearance: He is not ill-appearing or toxic-appearing.  Cardiovascular:     Rate and Rhythm: Normal rate and regular rhythm.     Heart sounds: No murmur heard. Pulmonary:     Effort: Pulmonary effort is normal. No respiratory distress.     Breath sounds: No wheezing, rhonchi or rales.  Musculoskeletal:     Right lower leg: No edema.     Left lower leg: No edema.     Comments: Moves both legs to command  Neurological:     Mental Status: He is alert.  Psychiatric:        Mood and Affect: Mood normal.        Behavior: Behavior  normal.     Data Reviewed:  CMP noted Hgb stable 10.3  Family Communication:   Disposition: Status is: Inpatient Remains inpatient appropriate because: needs rehab  Planned Discharge Destination:  CIR?    Time spent: 25 minutes  Author: Murray Hodgkins, MD 08/03/2022 7:12 PM  For on call review [email or web address].

## 2022-08-03 NOTE — Progress Notes (Signed)
Physical Therapy Treatment Patient Details Name: Samuel [name] MRN: [id number] DOB: [birth year] Today's Date: 08/03/2022   History of Present Illness 56 yo male with onset of intractible back pain with spine mets (unsure of primary per hx) was admitted on 9/7, now has plan for repletion of electrolytes, pain management and has pending plan for biopsy of lumbar spine and potential kyphoplasty.  Pain is in R hip and leg, low back and upper back at times.  Imaging has found mult thoracic spine spots that may be mets.  PMHx:  DM, pancreatitis, smoker, R rib and clavicle fractures,    PT Comments    Pt with increased L thigh pain today limiting ambulation tolerance this date. Recommended OT consult to address ability to perform ADLs in effort to help with d/c planning. Pt continues to be motivated to return to indep. Recommend AIR upon d/c to achieve safe mod I level of function. Pt demonstrates excellent rehab potential. Acute PT to continue to follow.    Recommendations for follow up therapy are one component of a multi-disciplinary discharge planning process, led by the attending physician.  Recommendations may be updated based on patient status, additional functional criteria and insurance authorization.  Follow Up Recommendations  Acute inpatient rehab (3hours/day)     Assistance Recommended at Discharge Frequent or constant Supervision/Assistance  Patient can return home with the following Assistance with cooking/housework;Assist for transportation;Help with stairs or ramp for entrance;A little help with walking and/or transfers;A little help with bathing/dressing/bathroom   Equipment Recommendations  Rolling walker (2 wheels)    Recommendations for Other Services       Precautions / Restrictions Precautions Precautions: Fall;Back Precaution Booklet Issued: No Precaution Comments: verbally reviewed back precautions, has already received information Required Braces or Orthoses:  Spinal Brace Spinal Brace: Thoracolumbosacral orthotic;Applied in sitting position Restrictions Weight Bearing Restrictions: No     Mobility  Bed Mobility Overal bed mobility: Needs Assistance Bed Mobility: Supine to Sit, Rolling Rolling: Min guard   Supine to sit: Min assist     General bed mobility comments: verbal cues for log roll technique, minA for trunk elevation, incresaed time, labored effort due to pain but able to complete    Transfers Overall transfer level: Needs assistance Equipment used: Rolling walker (2 wheels) Transfers: Sit to/from Stand Sit to Stand: Min assist           General transfer comment: minA to power up, bed slightly elevated, verbal cues for safe hand placement to push up from bed then transition to walker    Ambulation/Gait Ambulation/Gait assistance: Min assist Gait Distance (Feet): 60 Feet (x2) Assistive device: Rolling walker (2 wheels) Gait Pattern/deviations: Step-through pattern, Decreased stride length Gait velocity: dec Gait velocity interpretation: <1.31 ft/sec, indicative of household ambulator   General Gait Details: pt with c/o L thigh pain today suspect from swelling at surgical site limiting tolerance this date   Stairs             Wheelchair Mobility    Modified Rankin (Stroke Patients Only)       Balance Overall balance assessment: Needs assistance Sitting-balance support: Feet supported Sitting balance-Leahy Scale: Fair Sitting balance - Comments: reliant on UEs to offset back pain Postural control: Posterior lean Standing balance support: Bilateral upper extremity supported, During functional activity Standing balance-Leahy Scale: Poor Standing balance comment: dependent on RW  Cognition Arousal/Alertness: Awake/alert Behavior During Therapy: WFL for tasks assessed/performed Overall Cognitive Status: Within Functional Limits for tasks assessed                                  General Comments: pt motivated and eager to get back to walking        Exercises      General Comments General comments (skin integrity, edema, etc.): VSS      Pertinent Vitals/Pain Pain Assessment Pain Assessment: 0-10 Pain Score: 6  Pain Location: pt reports minimal back pain but more L thigh pain this morning, suspect is myotomal from the L2 fx s/p kyphoplasty Pain Descriptors / Indicators: Grimacing, Aching, Sharp Pain Intervention(s): Monitored during session    Home Living Family/patient expects to be discharged to:: Private residence Living Arrangements: Other relatives (sister) Available Help at Discharge: Family Type of Home: House Home Access: Stairs to enter Entrance Stairs-Rails: Can reach both Entrance Stairs-Number of Steps: 5   Home Layout: One level Home Equipment: None Additional Comments: TLSO for spine but no other equipment    Prior Function            PT Goals (current goals can now be found in the care plan section) Acute Rehab PT Goals Patient Stated Goal: to walk and get pain managed PT Goal Formulation: With patient Time For Goal Achievement: 08/13/22 Potential to Achieve Goals: Good Progress towards PT goals: Progressing toward goals    Frequency    Min 5X/week      PT Plan Discharge plan needs to be updated;Frequency needs to be updated    Co-evaluation              AM-PAC PT "6 Clicks" Mobility   Outcome Measure  Help needed turning from your back to your side while in a flat bed without using bedrails?: A Little Help needed moving from lying on your back to sitting on the side of a flat bed without using bedrails?: A Little Help needed moving to and from a bed to a chair (including a wheelchair)?: A Lot Help needed standing up from a chair using your arms (e.g., wheelchair or bedside chair)?: A Lot Help needed to walk in hospital room?: A Lot Help needed climbing 3-5 steps with a  railing? : Total 6 Click Score: 13    End of Session Equipment Utilized During Treatment: Gait belt Activity Tolerance: Patient limited by pain Patient left: with call bell/phone within reach;in chair Nurse Communication: Mobility status PT Visit Diagnosis: Unsteadiness on feet (R26.81);Pain;Difficulty in walking, not elsewhere classified (R26.2) Pain - Right/Left: Right Pain - part of body: Hip;Knee;Leg     Time: 3818-2993 PT Time Calculation (min) (ACUTE ONLY): 26 min  Charges:  $Gait Training: 23-37 mins                     Kittie Plater, PT, DPT Acute Rehabilitation Services Secure chat preferred Office #: [phone]    Berline Lopes 08/03/2022, 11:00 AM

## 2022-08-03 NOTE — Progress Notes (Signed)
Mobility Specialist: Progress Note   08/03/22 1227  Mobility  Activity Ambulated with assistance in hallway  Level of Assistance Contact guard assist, steadying assist  Assistive Device Front wheel walker  Distance Ambulated (ft) 210 ft  Activity Response Tolerated fair  $Mobility charge 1 Mobility   Post-Mobility: 104 HR, 92% SpO2  Pt received in the bed and agreeable to mobility. Slow gait secondary to LLE pain, no rating given. Pt back to bed after session with call bell and phone in reach.   [facility] Malee Grays Mobility Specialist Mobility Specialist [address]: [phone]

## 2022-08-03 NOTE — Evaluation (Signed)
Occupational Therapy Evaluation Patient Details Name: Samuel [name] MRN: [id number] DOB: 05-26-1966 Today's Date: 08/03/2022   History of Present Illness 56 yo male with onset of intractible back pain with spine mets (unsure of primary per hx) was admitted on 9/7, now has plan for repletion of electrolytes, pain management and has pending plan for biopsy of lumbar spine and potential kyphoplasty.  Pain is in R hip and leg, low back and upper back at times.  Imaging has found mult thoracic spine spots that may be mets. 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 PMHx:  DM, pancreatitis, smoker, R rib and clavicle fractures,   Clinical Impression   PTA patient independent with ADLs, mobility. Admitted for above and presents with problem list below, including back pain and precautions, impaired balance and decreased activity tolerance.  Patient currently requires min guard for bed mobility, min assist for transfers and limited in room mobility, and min to mod assist for ADLs.  He is limited by pain in L thigh today, premedicated but demonstrates limited OOB tolerance today.  He his highly motivated and I believe he will best benefit from intensive AIR level rehab to optimize independence, safety and return to PLOF.  Will follow acutely.      Recommendations for follow up therapy are one component of a multi-disciplinary discharge planning process, led by the attending physician.  Recommendations may be updated based on patient status, additional functional criteria and insurance authorization.   Follow Up Recommendations  Acute inpatient rehab (3hours/day)    Assistance Recommended at Discharge Intermittent Supervision/Assistance  Patient can return home with the following A little help with walking and/or transfers;A little help with bathing/dressing/bathroom;Assistance with cooking/housework;Assist for transportation;Help with stairs or ramp for entrance    Functional Status Assessment  Patient has had a  recent decline in their functional status and demonstrates the ability to make significant improvements in function in a reasonable and predictable amount of time.  Equipment Recommendations  Other (comment) (defer)    Recommendations for Other Services Rehab consult     Precautions / Restrictions Precautions Precautions: Fall;Back Precaution Booklet Issued: No Precaution Comments: verbally reviewed back precautions, has already received information Required Braces or Orthoses: Spinal Brace Spinal Brace: Thoracolumbosacral orthotic;Applied in sitting position Restrictions Weight Bearing Restrictions: No      Mobility Bed Mobility Overal bed mobility: Needs Assistance Bed Mobility: Rolling, Sidelying to Sit, Sit to Sidelying Rolling: Supervision Sidelying to sit: Min guard     Sit to sidelying: Min guard General bed mobility comments: verbal cueing for log roll technique, min guard for safety and increased time    Transfers Overall transfer level: Needs assistance Equipment used: Rolling walker (2 wheels) Transfers: Sit to/from Stand Sit to Stand: Min assist           General transfer comment: to power up and steady, cueing for hand placement      Balance Overall balance assessment: Needs assistance Sitting-balance support: Feet supported Sitting balance-Leahy Scale: Fair Sitting balance - Comments: min guard for safety, preference to UE support   Standing balance support: Bilateral upper extremity supported, During functional activity Standing balance-Leahy Scale: Poor Standing balance comment: dependent on RW, brief bouts of 1 UE support for functional ADLs with min assist for balance                           ADL either performed or assessed with clinical judgement   ADL Overall ADL's : Needs assistance/impaired  Grooming: Minimal assistance;Standing Grooming Details (indicate cue type and reason): relies on at least 1 UE support in  standing, poor tolerance         Upper Body Dressing : Minimal assistance;Sitting Upper Body Dressing Details (indicate cue type and reason): brace mgmt Lower Body Dressing: Minimal assistance;Sit to/from stand   Toilet Transfer: Minimal assistance;Ambulation;Rolling walker (2 wheels) Toilet Transfer Details (indicate cue type and reason): simulated in room Toileting- Clothing Manipulation and Hygiene: Moderate assistance;Sit to/from stand       Functional mobility during ADLs: Minimal assistance;Rolling walker (2 wheels) General ADL Comments: pt limited by pain in L thigh today, poor tolerance     Vision   Vision Assessment?: No apparent visual deficits     Perception     Praxis      Pertinent Vitals/Pain Pain Assessment Pain Assessment: 0-10 Pain Score: 8  (4 rest, up to 8 during activity) Pain Location: L thigh Pain Descriptors / Indicators: Grimacing, Aching, Sharp Pain Intervention(s): Limited activity within patient's tolerance, Monitored during session, Repositioned     Hand Dominance Right   Extremity/Trunk Assessment Upper Extremity Assessment Upper Extremity Assessment: Overall WFL for tasks assessed   Lower Extremity Assessment Lower Extremity Assessment: Defer to PT evaluation   Cervical / Trunk Assessment Cervical / Trunk Assessment: Back Surgery Cervical / Trunk Exceptions: kyphoplasty, TLSO   Communication Communication Communication: No difficulties   Cognition Arousal/Alertness: Awake/alert Behavior During Therapy: WFL for tasks assessed/performed Overall Cognitive Status: Within Functional Limits for tasks assessed                                       General Comments  VSS    Exercises     Shoulder Instructions      Home Living Family/patient expects to be discharged to:: Private residence Living Arrangements: Other relatives (sister) Available Help at Discharge: Family Type of Home: House Home Access: Stairs to  enter Technical brewer of Steps: 5 Entrance Stairs-Rails: Can reach both Home Layout: One level     Bathroom Shower/Tub: Teacher, early years/pre: Standard     Home Equipment: None   Additional Comments: TLSO for spine but no other equipment      Prior Functioning/Environment Prior Level of Function : Independent/Modified Independent             Mobility Comments: independent with mobility PTA ADLs Comments: independent bathing at sink (increased effort but able), dressing slip on shoes; microwave meals        OT Problem List: Decreased activity tolerance;Impaired balance (sitting and/or standing);Decreased knowledge of use of DME or AE;Decreased knowledge of precautions;Pain      OT Treatment/Interventions: Self-care/ADL training;Therapeutic exercise;DME and/or AE instruction;Therapeutic activities;Balance training;Patient/family education;Energy conservation    OT Goals(Current goals can be found in the care plan section) Acute Rehab OT Goals Patient Stated Goal: less pain OT Goal Formulation: With patient Time For Goal Achievement: 08/17/22 Potential to Achieve Goals: Good  OT Frequency: Min 2X/week    Co-evaluation              AM-PAC OT "6 Clicks" Daily Activity     Outcome Measure Help from another person eating meals?: None Help from another person taking care of personal grooming?: A Little Help from another person toileting, which includes using toliet, bedpan, or urinal?: A Lot Help from another person bathing (including washing, rinsing, drying)?: A Little Help from  another person to put on and taking off regular upper body clothing?: A Little Help from another person to put on and taking off regular lower body clothing?: A Little 6 Click Score: 18   End of Session Equipment Utilized During Treatment: Gait belt;Rolling walker (2 wheels);Back brace Nurse Communication: Mobility status  Activity Tolerance: Patient limited by  pain Patient left: in bed;with call bell/phone within reach;with bed alarm set  OT Visit Diagnosis: Other abnormalities of gait and mobility (R26.89);Muscle weakness (generalized) (M62.81);Pain Pain - Right/Left: Left Pain - part of body: Leg                Time: 6063-0160 OT Time Calculation (min): 16 min Charges:  OT General Charges $OT Visit: 1 Visit OT Evaluation $OT Eval Moderate Complexity: 1 Mod  Jolaine Artist, OT Acute Rehabilitation Services Office [phone]   Delight Stare 08/03/2022, 12:00 PM

## 2022-08-04 ENCOUNTER — Telehealth: Payer: Self-pay | Admitting: Internal Medicine

## 2022-08-04 ENCOUNTER — Inpatient Hospital Stay (HOSPITAL_COMMUNITY)
Admit: 2022-08-04 | Discharge: 2022-08-04 | Disposition: A | Discharge status: Home / Self Care | Payer: BLUE CROSS/BLUE SHIELD | Attending: Radiation Oncology | Admitting: Radiation Oncology

## 2022-08-04 ENCOUNTER — Inpatient Hospital Stay (HOSPITAL_COMMUNITY): Payer: BLUE CROSS/BLUE SHIELD

## 2022-08-04 DIAGNOSIS — M549 Dorsalgia, unspecified: Secondary | ICD-10-CM | POA: Diagnosis not present

## 2022-08-04 DIAGNOSIS — S32029A Unspecified fracture of second lumbar vertebra, initial encounter for closed fracture: Secondary | ICD-10-CM | POA: Diagnosis not present

## 2022-08-04 DIAGNOSIS — C7951 Secondary malignant neoplasm of bone: Secondary | ICD-10-CM

## 2022-08-04 DIAGNOSIS — M8458XS Pathological fracture in neoplastic disease, other specified site, sequela: Secondary | ICD-10-CM

## 2022-08-04 DIAGNOSIS — E119 Type 2 diabetes mellitus without complications: Secondary | ICD-10-CM

## 2022-08-04 LAB — GLUCOSE, CAPILLARY
Glucose-Capillary: 89 mg/dL (ref 70–99)
Glucose-Capillary: 81 mg/dL (ref 70–99)
Glucose-Capillary: 83 mg/dL (ref 70–99)
Glucose-Capillary: 210 mg/dL — ABNORMAL HIGH (ref 70–99)

## 2022-08-04 MED ORDER — MORPHINE SULFATE 15 MG PO TABS
15.0000 mg | ORAL_TABLET | ORAL | Status: DC | PRN
  Administered 2022-08-05 – 2022-08-17 (×25): 15 mg via ORAL
  Filled 2022-08-04 (×25): qty 1

## 2022-08-04 MED ORDER — FLUDEOXYGLUCOSE F - 18 (FDG) INJECTION
5.3200 | Freq: Once | INTRAVENOUS | Status: AC | PRN
  Administered 2022-08-04: 5.32 via INTRAVENOUS

## 2022-08-04 MED ORDER — ENOXAPARIN SODIUM 30 MG/0.3ML IJ SOSY
30.0000 mg | PREFILLED_SYRINGE | Freq: Every day | INTRAMUSCULAR | Status: DC
  Administered 2022-08-05 – 2022-08-13 (×9): 30 mg via SUBCUTANEOUS
  Filled 2022-08-04 (×9): qty 0.3

## 2022-08-04 MED ORDER — HYDROMORPHONE HCL 1 MG/ML IJ SOLN
0.5000 mg | INTRAMUSCULAR | Status: DC | PRN
  Administered 2022-08-05 – 2022-08-15 (×16): 0.5 mg via INTRAVENOUS
  Filled 2022-08-04 (×18): qty 0.5

## 2022-08-04 NOTE — Progress Notes (Signed)
Inpatient Rehab Admissions Coordinator:   Pt off grounds to [facility] for PET scans.  Will f/u tomorrow.   Shann Medal, PT, DPT Admissions Coordinator [phone] 08/04/22  3:27 PM

## 2022-08-04 NOTE — Progress Notes (Signed)
Patient off unit to [facility] for PET scan.

## 2022-08-04 NOTE — Progress Notes (Signed)
  Progress Note   Patient: Samuel [name] JEH:[id number] DOB: 1966-06-30 DOA: 07/28/2022     6 DOS: the patient was seen and examined on 08/04/2022   Brief hospital course: 56 year old man first present with back pain 5 months ago was found to have metastatic disease to the spine.  Care fragmented and incomplete secondary to social and financial constraints.  Imaging revealed metastatic deposit L2 which had progressed to compression fracture, spinal canal stenosis and lower extremity weakness.  Patient underwent CT-guided biopsy of L2 coupled followed by radiofrequency ablation and balloon kyphoplasty with clinical improvement.  Biopsy results notable for likely lung primary.  Completed PET scan 9/14.  Plan for CIR.  Medically stable.  Assessment and Plan: Intractable lumbar pain secondary to metastatic disease of uncertain etiology, with L2 pathologic compression fracture, pathology consistent with lung origin -- Status post L2 biopsy, radiofrequency ablation and balloon kyphoplasty with improved pain control and mobility.   --Plan for rehab, possibly CIR --Per neurosurgery no intervention recommended.  TLSO brace, steroids as needed, but no other acute intervention.  --Continue analgesia    Uncontrolled diabetes mellitus type 2 associated with hyperglycemia  --Hemoglobin A1c 13  --Stable, continue Lantus, sliding scale insulin   Hypomagnesemia -- Resolved   Hypokalemia --Resolved   Depression and Anxiety --Continue Lexapro and Trazodone   Hyperlipidemia --Continue Lipitor   Normocytic Anemia --Stable.  Follow-up as an outpatient.       Subjective:  Feels ok, pain controlled but feels weaker.  Physical Exam: Vitals:   08/03/22 2356 08/04/22 0333 08/04/22 0500 08/04/22 0737  BP: 107/67 111/67  110/78  Pulse: (!) 103 (!) 102  85  Resp: 19 18  16   Temp: 98 F (36.7 C) 98.8 F (37.1 C)  97.8 F (36.6 C)  TempSrc:  Oral  Oral  SpO2: 93% 93%  100%  Weight:   48.3 kg    Height:       Physical Exam Vitals reviewed.  Constitutional:      General: He is not in acute distress.    Appearance: He is not ill-appearing or toxic-appearing.  Cardiovascular:     Rate and Rhythm: Normal rate and regular rhythm.     Heart sounds: No murmur heard. Pulmonary:     Effort: Pulmonary effort is normal. No respiratory distress.     Breath sounds: No wheezing, rhonchi or rales.  Neurological:     Mental Status: He is alert.  Psychiatric:        Mood and Affect: Mood normal.        Behavior: Behavior normal.     Data Reviewed:  CBG stable  Family Communication:   Disposition: Status is: Inpatient Remains inpatient appropriate because: needs CIR  Planned Discharge Destination:  CIR    Time spent: 35 minutes  Author: Murray Hodgkins, MD 08/04/2022 6:53 PM  For on call review [email or web address].

## 2022-08-04 NOTE — Progress Notes (Signed)
Nutrition Follow-up  DOCUMENTATION CODES:   Not applicable  INTERVENTION:   -Continue Glucerna Shake po TID, each supplement provides 220 kcal and 10 grams of protein  -Double protein portions at meals -MVI with minerals daily -Referred to [facility] for further education and support  NUTRITION DIAGNOSIS:   Increased nutrient needs related to chronic illness as evidenced by estimated needs.  Ongoing  GOAL:   Patient will meet greater than or equal to 90% of their needs  Progressing   MONITOR:   PO intake, Supplement acceptance, Labs, Weight trends, I & O's  REASON FOR ASSESSMENT:   Consult Assessment of nutrition requirement/status  ASSESSMENT:   56 y.o. male with medical history significant of diabetes mellitus on insulin, anxiety and depression, and diabetic neuropathy.  Patient was in the process of undergoing malignancy work-up.  He was found to have back pain 4 months ago and has been receiving fragmented and incomplete care due to his social and financial circumstances.  He is currently being followed by Dr. Tyler Pita radiation oncology.  9/11- L2 core biopsy, RFA, and kyphoplasty  Reviewed I/O's: -2.3 L x 24 hours and -2.8 L since admission  UOP: 3.5 L x 24 hours   Attempted to speak with pt x 3, however, pt off units for PET scans at [facility]. Unable to obtain nutrition-focused physical exam or further history at this time.   Pt remains with good oral intake. Noted meal completions 75-100%. Pt is also consuming Glucerna supplements.   Noted severe decline in weight since admission. Unsure of accuracy of most recent wt.   Highly suspect pt with malnutrition, however, unable to identify at this time.   Medications reviewed and include morphine, miralax, and colace.   Lab Results  Component Value Date   HGBA1C 13.0 (H) 07/29/2022   PTA DM medications are none.   Labs reviewed: CBGS: 89-232 (inpatient orders  for glycemic control are 0-15 units insulin aspart TID with meals and 32 units insulin glargine-yfgn daily).    Diet Order:   Diet Order             Diet regular Room service appropriate? Yes; Fluid consistency: Thin  Diet effective now                   EDUCATION NEEDS:   Not appropriate for education at this time  Skin:  Skin Assessment: Skin Integrity Issues: Skin Integrity Issues:: Incisions Incisions: lower vertebral column  Last BM:  08/04/22  Height:   Ht Readings from Last 1 Encounters:  07/28/22 5\' 8"  (1.727 m)    Weight:   Wt Readings from Last 1 Encounters:  08/04/22 48.3 kg   BMI:  Body mass index is 16.19 kg/m.  Estimated Nutritional Needs:   Kcal:  1950-2150  Protein:  105-120 grams  Fluid:  >1.9 L    Loistine Chance, RD, LDN, [place] Registered Dietitian II Certified Diabetes Care and Education Specialist Please refer to [facility] for RD and/or RD on-call/weekend/after hours pager

## 2022-08-04 NOTE — Progress Notes (Signed)
PT Cancellation Note  Patient Details Name: RENSO SWETT MRN: [id number] DOB: 08/15/1966   Cancelled Treatment:    Reason Eval/Treat Not Completed: Patient at procedure or test/unavailable; informed pt just left for [facility] for PET scans.  Will attempt later as schedule permits.    Reginia Naas 08/04/2022, 10:41 AM Magda Kiel, PT Acute Rehabilitation Services Office:[phone] 08/04/2022

## 2022-08-04 NOTE — Telephone Encounter (Signed)
R/s pt's new pt appt per 9/14 staff msg. Received a call from RN Mont Dutton, stating that pt was inpatient and would not be discharged in time for his original appt on 9/21. Also r/s pt's appt to see Dr. Julien Nordmann as his path came back for lung cancer.   Called pt, no answer. Left msg with new appt date/time. Requested for pt to call back to confirm appt change.

## 2022-08-04 NOTE — Hospital Course (Signed)
56 year old man first present with back pain 5 months ago was found to have metastatic disease to the spine.  Care fragmented and incomplete secondary to social and financial constraints.  Imaging revealed metastatic deposit L2 which had progressed to compression fracture, spinal canal stenosis and lower extremity weakness.  Patient underwent CT-guided biopsy of L2 coupled followed by radiofrequency ablation and balloon kyphoplasty with clinical improvement.  Biopsy results notable for likely lung primary.  Completed PET scan 9/14.  Plan for CIR.  Medically stable.

## 2022-08-05 ENCOUNTER — Encounter (HOSPITAL_COMMUNITY): Payer: Self-pay | Admitting: Internal Medicine

## 2022-08-05 ENCOUNTER — Inpatient Hospital Stay (HOSPITAL_COMMUNITY): Payer: BLUE CROSS/BLUE SHIELD

## 2022-08-05 DIAGNOSIS — C3490 Malignant neoplasm of unspecified part of unspecified bronchus or lung: Secondary | ICD-10-CM

## 2022-08-05 DIAGNOSIS — C7931 Secondary malignant neoplasm of brain: Secondary | ICD-10-CM | POA: Diagnosis not present

## 2022-08-05 DIAGNOSIS — C7951 Secondary malignant neoplasm of bone: Secondary | ICD-10-CM | POA: Diagnosis not present

## 2022-08-05 DIAGNOSIS — S32029A Unspecified fracture of second lumbar vertebra, initial encounter for closed fracture: Secondary | ICD-10-CM | POA: Diagnosis not present

## 2022-08-05 HISTORY — DX: Secondary malignant neoplasm of brain: C79.31

## 2022-08-05 HISTORY — DX: Malignant neoplasm of unspecified part of unspecified bronchus or lung: C34.90

## 2022-08-05 LAB — GLUCOSE, CAPILLARY
Glucose-Capillary: 263 mg/dL — ABNORMAL HIGH (ref 70–99)
Glucose-Capillary: 139 mg/dL — ABNORMAL HIGH (ref 70–99)
Glucose-Capillary: 127 mg/dL — ABNORMAL HIGH (ref 70–99)
Glucose-Capillary: 315 mg/dL — ABNORMAL HIGH (ref 70–99)

## 2022-08-05 MED ORDER — GADOPICLENOL 0.5 MMOL/ML IV SOLN
4.8000 mL | Freq: Once | INTRAVENOUS | Status: AC | PRN
  Administered 2022-08-05: 4.8 mL via INTRAVENOUS

## 2022-08-05 NOTE — Progress Notes (Signed)
Progress Note   Patient: Samuel [name] YDX:[id number] DOB: November 09, 1966 DOA: 07/28/2022     7 DOS: the patient was seen and examined on 08/05/2022   Brief hospital course: 56 year old man first present with back pain 5 months ago was found to have metastatic disease to the spine.  Care fragmented and incomplete secondary to social and financial constraints.  Imaging revealed metastatic deposit L2 which had progressed to compression fracture, spinal canal stenosis and lower extremity weakness.  Patient underwent CT-guided biopsy of L2 coupled followed by radiofrequency ablation and balloon kyphoplasty with clinical improvement.  Biopsy results notable for likely lung primary.  Completed PET scan 9/14.  Plan for CIR.  Medically stable.  Assessment and Plan: Intractable lumbar pain secondary to metastatic disease of uncertain etiology, with L2 pathologic compression fracture, pathology consistent with lung origin; pain secondary to malignancy -- Status post L2 biopsy, radiofrequency ablation and balloon kyphoplasty with improved pain control and mobility.   --Plan for CIR --Per neurosurgery no intervention recommended.  TLSO brace, steroids as needed, but no other acute intervention.  --Continue analgesia with MS Contin and immediately release morphine for breakthrough  Metastatic lung cancer --New diagnosis.  Metastatic to bone and brain.   --PET scan notable for 2 hypermetabolic right pulmonary nodules and adenopathy which may relate to metastatic bronchogenic carcinoma. --MRI brain notable for punctate lesions right cerebellum and left temporal lobe consistent with metastatic disease --Discussed with Dr. Chryl Heck -- no role for inpatient chemotherapy, does recommend referral to radiation oncology and outpatient follow-up.  Abnormal appearance of the lungs on PET scan with groundglass opacities that are hypermetabolic.  Discussed with pulmonary medicine, in the absence of fever, cough, hypoxia or  respiratory signs or symptoms, recommends observation for now.    Uncontrolled diabetes mellitus type 2 associated with hyperglycemia  --Hemoglobin A1c 13  --Stable, continue Lantus, sliding scale insulin   Hypomagnesemia -- Resolved   Hypokalemia --Resolved   Depression and Anxiety --Continue Lexapro and Trazodone   Hyperlipidemia --Continue Lipitor   Normocytic Anemia --Stable.  Follow-up as an outpatient.  Subjective:  Feels ok but has left thigh pain  Physical Exam: Vitals:   08/05/22 0433 08/05/22 0757 08/05/22 1114 08/05/22 1117  BP: 97/66 99/66 (!) 87/59 (!) 90/59  Pulse: 92 86 91   Resp:  18 16   Temp:  98.6 F (37 C) 98.4 F (36.9 C)   TempSrc:  Oral Oral   SpO2:  93% 93%   Weight: 48.3 kg     Height:       Physical Exam Vitals reviewed.  Constitutional:      General: He is not in acute distress.    Appearance: He is not ill-appearing or toxic-appearing.  Cardiovascular:     Rate and Rhythm: Normal rate and regular rhythm.     Heart sounds: No murmur heard. Pulmonary:     Effort: Pulmonary effort is normal. No respiratory distress.     Breath sounds: No wheezing, rhonchi or rales.  Musculoskeletal:     Comments: Left thigh nontender, appears unremarkable  Neurological:     Mental Status: He is alert.  Psychiatric:        Mood and Affect: Mood normal.        Behavior: Behavior normal.   Data Reviewed:  CBG stable  Family Communication: none  Disposition: Status is: Inpatient Remains inpatient appropriate because: needs CIR  Planned Discharge Destination:  CIR    Time spent: 20 minutes  Author: Murray Hodgkins,  MD 08/05/2022 12:55 PM  For on call review [email or web address].

## 2022-08-05 NOTE — Progress Notes (Signed)
Patient returned from MRI.

## 2022-08-05 NOTE — Progress Notes (Signed)
Occupational Therapy Treatment Patient Details Name: Samuel [name] MRN: [id number] DOB: 1966-04-23 Today's Date: 08/05/2022   History of present illness 56 yo male with onset of intractible back pain with spine mets (unsure of primary per hx) was admitted on 9/7, now has plan for repletion of electrolytes, pain management and has pending plan for biopsy of lumbar spine and potential kyphoplasty.  Pain is in R hip and leg, low back and upper back at times.  Imaging has found mult thoracic spine spots that may be mets. 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 PMHx:  DM, pancreatitis, smoker, R rib and clavicle fractures,   OT comments  Pt. Seen for skilled OT treatment session.  Eager and motivated but limited by LLE pain.  Able to complete ub/lb dressing without assistance.  Good technique for transfer to recliner but unable to sit greater than 3 min. Before requesting back to bed. States supine flat feels the most comfortable and controls LLE pain also.  Cont. To progress as pt. Able. He is very motivated.    Recommendations for follow up therapy are one component of a multi-disciplinary discharge planning process, led by the attending physician.  Recommendations may be updated based on patient status, additional functional criteria and insurance authorization.    Follow Up Recommendations  Acute inpatient rehab (3hours/day)    Assistance Recommended at Discharge Intermittent Supervision/Assistance  Patient can return home with the following  A little help with walking and/or transfers;A little help with bathing/dressing/bathroom;Assistance with cooking/housework;Assist for transportation;Help with stairs or ramp for entrance   Equipment Recommendations  Other (comment)    Recommendations for Other Services Rehab consult    Precautions / Restrictions Precautions Precautions: Fall;Back Precaution Comments: verbally reviewed back precautions, has already received information Required Braces or  Orthoses: Spinal Brace Spinal Brace: Thoracolumbosacral orthotic;Applied in sitting position       Mobility Bed Mobility Overal bed mobility: Needs Assistance Bed Mobility: Rolling, Sidelying to Sit, Sit to Sidelying Rolling: Supervision Sidelying to sit: Supervision     Sit to sidelying: Supervision General bed mobility comments: light cues for log roll but good demo in/out of bed    Transfers Overall transfer level: Needs assistance Equipment used: Rolling walker (2 wheels) Transfers: Sit to/from Stand, Bed to chair/wheelchair/BSC Sit to Stand: Min guard Stand pivot transfers: Min guard         General transfer comment: better adhearance to hand placement     Balance                                           ADL either performed or assessed with clinical judgement   ADL Overall ADL's : Needs assistance/impaired                 Upper Body Dressing : Set up;Sitting Upper Body Dressing Details (indicate cue type and reason): able to don brace correctly without cues or assistance Lower Body Dressing: Supervision/safety;Bed level Lower Body Dressing Details (indicate cue type and reason): pt. lays on back and bends knees bringing each foot closer and dons socks without assistance states it helps manage the pain to be flat Toilet Transfer: Minimal assistance;Ambulation;Rolling walker (2 wheels) Toilet Transfer Details (indicate cue type and reason): simulated in room to recliner         Functional mobility during ADLs: Minimal assistance;Rolling walker (2 wheels) General ADL Comments: pt limited by pain in  L thigh today, poor tolerance-unabel to sit in chair longer that 3 min. before stating "ive got to get back to the bed"    Extremity/Trunk Assessment              Vision       Perception     Praxis      Cognition Arousal/Alertness: Awake/alert Behavior During Therapy: WFL for tasks assessed/performed Overall Cognitive Status:  Within Functional Limits for tasks assessed                                 General Comments: pt motivated and eager to get back to walking        Exercises      Shoulder Instructions       General Comments      Pertinent Vitals/ Pain       Pain Assessment Pain Assessment: 0-10 Pain Score: 8  Pain Location: L thigh Pain Descriptors / Indicators: Grimacing, Aching, Sharp Pain Intervention(s): Limited activity within patient's tolerance, Monitored during session, Repositioned  Home Living                                          Prior Functioning/Environment              Frequency  Min 2X/week        Progress Toward Goals  OT Goals(current goals can now be found in the care plan section)  Progress towards OT goals: Progressing toward goals     Plan Discharge plan remains appropriate    Co-evaluation                 AM-PAC OT "6 Clicks" Daily Activity     Outcome Measure   Help from another person eating meals?: None Help from another person taking care of personal grooming?: A Little Help from another person toileting, which includes using toliet, bedpan, or urinal?: A Lot Help from another person bathing (including washing, rinsing, drying)?: A Little Help from another person to put on and taking off regular upper body clothing?: A Little Help from another person to put on and taking off regular lower body clothing?: A Little 6 Click Score: 18    End of Session Equipment Utilized During Treatment: Gait belt;Rolling walker (2 wheels);Back brace  OT Visit Diagnosis: Other abnormalities of gait and mobility (R26.89);Muscle weakness (generalized) (M62.81);Pain Pain - Right/Left: Left Pain - part of body: Leg   Activity Tolerance Patient limited by pain   Patient Left in bed;with call bell/phone within reach   Nurse Communication Other (comment) (rn states ok to work with pt. today)        Time:  8563-1497 OT Time Calculation (min): 12 min  Charges: OT General Charges $OT Visit: 1 Visit OT Treatments $Self Care/Home Management : 8-22 mins  Sonia Baller, COTA/L Acute Rehabilitation [phone]   Clearnce Sorrel Lorraine-COTA/L 08/05/2022, 11:29 AM

## 2022-08-05 NOTE — Progress Notes (Signed)
Physical Therapy Treatment Patient Details Name: Samuel [name] MRN: [id number] DOB: 1966/04/26 Today's Date: 08/05/2022   History of Present Illness 56 yo male with onset of intractible back pain with spine mets (unsure of primary per hx) was admitted on 9/7, now has plan for repletion of electrolytes, pain management and has pending plan for biopsy of lumbar spine and potential kyphoplasty.  Pain is in R hip and leg, low back and upper back at times.  Imaging has found mult thoracic spine spots that may be mets. 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 PMHx:  DM, pancreatitis, smoker, R rib and clavicle fractures,    PT Comments    Patient progressing well towards PT goals. Session focused on ambulation progression and mobility. Requires less assist for bed mobility, transfers and gait training today with use of RW. Continues to have left anterior thigh pain worse with AROM and improved with PROM. Reports some numbness as well in medial quad. Pt reports inconsistent pain control impacting his mobility. Would benefit from pain management routine/schedule to stay ahead of the pain so pt can tolerate mobility. Continues to be highly motivated to mobilize and improve function. Will follow.    Recommendations for follow up therapy are one component of a multi-disciplinary discharge planning process, led by the attending physician.  Recommendations may be updated based on patient status, additional functional criteria and insurance authorization.  Follow Up Recommendations  Acute inpatient rehab (3hours/day) Can patient physically be transported by private vehicle: Yes   Assistance Recommended at Discharge Frequent or constant Supervision/Assistance  Patient can return home with the following Assistance with cooking/housework;Assist for transportation;Help with stairs or ramp for entrance;A little help with walking and/or transfers;A little help with bathing/dressing/bathroom   Equipment Recommendations   Rolling walker (2 wheels)    Recommendations for Other Services       Precautions / Restrictions Precautions Precautions: Fall;Back Precaution Booklet Issued: No Precaution Comments: verbally reviewed back precautions, has already received information Required Braces or Orthoses: Spinal Brace Spinal Brace: Thoracolumbosacral orthotic;Applied in sitting position Restrictions Weight Bearing Restrictions: No     Mobility  Bed Mobility Overal bed mobility: Needs Assistance Bed Mobility: Rolling, Sidelying to Sit, Sit to Sidelying Rolling: Supervision Sidelying to sit: Supervision     Sit to sidelying: Supervision General bed mobility comments: HOB flat, good demo of log roll technique.    Transfers Overall transfer level: Needs assistance Equipment used: Rolling walker (2 wheels) Transfers: Sit to/from Stand Sit to Stand: Supervision           General transfer comment: Supervision for safety. Stood from [employer].    Ambulation/Gait Ambulation/Gait assistance: Min guard Gait Distance (Feet): 120 Feet Assistive device: Rolling walker (2 wheels) Gait Pattern/deviations: Step-through pattern, Decreased stride length Gait velocity: dec Gait velocity interpretation: <1.31 ft/sec, indicative of household ambulator   General Gait Details: Slow, guarded gait using RW, cues for upright posture. Pain in left thigh.Increased WB through [place].   Stairs             Wheelchair Mobility    Modified Rankin (Stroke Patients Only)       Balance Overall balance assessment: Needs assistance Sitting-balance support: Feet supported, No upper extremity supported Sitting balance-Leahy Scale: Good Sitting balance - Comments: Able to donn brace independently.   Standing balance support: During functional activity Standing balance-Leahy Scale: Poor Standing balance comment: needs UE support  Cognition Arousal/Alertness:  Awake/alert Behavior During Therapy: WFL for tasks assessed/performed Overall Cognitive Status: Within Functional Limits for tasks assessed                                 General Comments: pt motivated and eager to get back to walking        Exercises      General Comments        Pertinent Vitals/Pain Pain Assessment Pain Assessment: 0-10 Pain Score: 6  Pain Location: Lft thigh Pain Descriptors / Indicators: Aching, Sharp, Discomfort, Numbness, Sore Pain Intervention(s): Monitored during session, Repositioned, Limited activity within patient's tolerance    Home Living                          Prior Function            PT Goals (current goals can now be found in the care plan section) Progress towards PT goals: Progressing toward goals    Frequency    Min 5X/week      PT Plan Current plan remains appropriate    Co-evaluation              AM-PAC PT "6 Clicks" Mobility   Outcome Measure  Help needed turning from your back to your side while in a flat bed without using bedrails?: A Little Help needed moving from lying on your back to sitting on the side of a flat bed without using bedrails?: A Little Help needed moving to and from a bed to a chair (including a wheelchair)?: A Little Help needed standing up from a chair using your arms (e.g., wheelchair or bedside chair)?: A Little Help needed to walk in hospital room?: A Little Help needed climbing 3-5 steps with a railing? : A Lot 6 Click Score: 17    End of Session Equipment Utilized During Treatment: Gait belt Activity Tolerance: Patient limited by pain;Patient tolerated treatment well Patient left: in bed;with call bell/phone within reach;with family/visitor present Nurse Communication: Mobility status PT Visit Diagnosis: Unsteadiness on feet (R26.81);Pain;Difficulty in walking, not elsewhere classified (R26.2) Pain - Right/Left: Right Pain - part of body: Hip;Knee;Leg      Time: 1740-8144 PT Time Calculation (min) (ACUTE ONLY): 16 min  Charges:  $Gait Training: 8-22 mins                     Marisa Severin, PT, DPT Acute Rehabilitation Services Secure chat preferred Office [place] 08/05/2022, 1:29 PM

## 2022-08-05 NOTE — Progress Notes (Signed)
Inpatient Rehab Coordinator Note:  I met with patient and his nephew at bedside to discuss CIR recommendations and goals/expectations of CIR stay.  We reviewed 3 hrs/day of therapy, physician follow up, and average length of stay 2 weeks (dependent upon progress) with goals of supervision to mod I.  Per pt, his sister works during the day so there will be times he will need to be safe to be alone.  I think this is reasonable.  We discussed new dx of lung cancer, with mets, and pending consultation with oncology (unclear whether expected inpatient/outpatient at this time).  I will touch base with medical team to determine best timing to open prior auth request with insurance.  Will follow.   Shann Medal, PT, DPT Admissions Coordinator [phone] 08/05/22  2:52 PM

## 2022-08-05 NOTE — TOC Progression Note (Signed)
Transition of Care [facility]) - Progression Note    Patient Details  Name: Samuel [name] MRN: [id number] Date of Birth: Mar 09, 1966  Transition [facility]) CM/SW [place], [place] Phone Number: 08/05/2022, 3:39 PM  Clinical Narrative:   CSW received call from patient's insurance case manager. Patient has a BCBS out of [place], but should switch to [place] if he is planning on staying up here (which he is). CSW received information for patient to call [employer] and update his address to change coverage to [place] instead. CSW met with patient to provide information, and he agreed to call and take care of it. CSW updated Rehab Admissions. CSW to follow.    Expected Discharge Plan: IP Rehab Facility Barriers to Discharge: Continued Medical Work up, Ship broker  Expected Discharge Plan and Services Expected Discharge Plan: [place] In-house Referral: Clinical Social Work     Living arrangements for the past 2 months: Single Family Home                                       Social Determinants of Health (SDOH) Interventions    Readmission Risk Interventions     No data to display

## 2022-08-06 DIAGNOSIS — S32029A Unspecified fracture of second lumbar vertebra, initial encounter for closed fracture: Secondary | ICD-10-CM | POA: Diagnosis not present

## 2022-08-06 DIAGNOSIS — C3490 Malignant neoplasm of unspecified part of unspecified bronchus or lung: Secondary | ICD-10-CM | POA: Diagnosis not present

## 2022-08-06 DIAGNOSIS — C7951 Secondary malignant neoplasm of bone: Secondary | ICD-10-CM | POA: Diagnosis not present

## 2022-08-06 DIAGNOSIS — E119 Type 2 diabetes mellitus without complications: Secondary | ICD-10-CM | POA: Diagnosis not present

## 2022-08-06 LAB — GLUCOSE, CAPILLARY
Glucose-Capillary: 340 mg/dL — ABNORMAL HIGH (ref 70–99)
Glucose-Capillary: 130 mg/dL — ABNORMAL HIGH (ref 70–99)
Glucose-Capillary: 150 mg/dL — ABNORMAL HIGH (ref 70–99)
Glucose-Capillary: 146 mg/dL — ABNORMAL HIGH (ref 70–99)

## 2022-08-06 MED ORDER — INSULIN ASPART 100 UNIT/ML IJ SOLN
3.0000 [IU] | Freq: Three times a day (TID) | INTRAMUSCULAR | Status: DC
  Administered 2022-08-06 – 2022-08-10 (×8): 3 [IU] via SUBCUTANEOUS

## 2022-08-06 MED ORDER — INSULIN GLARGINE-YFGN 100 UNIT/ML ~~LOC~~ SOLN
36.0000 [IU] | Freq: Every day | SUBCUTANEOUS | Status: DC
  Administered 2022-08-07 – 2022-08-09 (×3): 36 [IU] via SUBCUTANEOUS
  Filled 2022-08-06 (×3): qty 0.36

## 2022-08-06 MED ORDER — INSULIN ASPART 100 UNIT/ML IJ SOLN
0.0000 [IU] | Freq: Three times a day (TID) | INTRAMUSCULAR | Status: DC
  Administered 2022-08-06 – 2022-08-07 (×2): 2 [IU] via SUBCUTANEOUS
  Administered 2022-08-08 – 2022-08-09 (×3): 5 [IU] via SUBCUTANEOUS
  Administered 2022-08-09: 2 [IU] via SUBCUTANEOUS
  Administered 2022-08-10: 5 [IU] via SUBCUTANEOUS
  Administered 2022-08-10: 2 [IU] via SUBCUTANEOUS
  Administered 2022-08-11: 8 [IU] via SUBCUTANEOUS

## 2022-08-06 MED ORDER — INSULIN ASPART 100 UNIT/ML IJ SOLN
0.0000 [IU] | Freq: Every day | INTRAMUSCULAR | Status: DC
  Administered 2022-08-08 – 2022-08-09 (×2): 2 [IU] via SUBCUTANEOUS
  Administered 2022-08-10: 4 [IU] via SUBCUTANEOUS

## 2022-08-06 NOTE — Progress Notes (Signed)
Progress Note   Patient: Samuel [name] AST:[id number] DOB: 21-Dec-1965 DOA: 07/28/2022     8 DOS: the patient was seen and examined on 08/06/2022   Brief hospital course: 56 year old man first present with back pain 5 months ago was found to have metastatic disease to the spine.  Care fragmented and incomplete secondary to social and financial constraints.  Imaging revealed metastatic deposit L2 which had progressed to compression fracture, spinal canal stenosis and lower extremity weakness.  Patient underwent CT-guided biopsy of L2 coupled followed by radiofrequency ablation and balloon kyphoplasty with clinical improvement.  Biopsy results notable for likely lung primary.  Completed PET scan 9/14.  Plan for CIR.  Medically stable.  Assessment and Plan: Intractable lumbar pain secondary to metastatic disease of uncertain etiology, with L2 pathologic compression fracture, pathology consistent with lung origin; pain secondary to malignancy -- Status post L2 biopsy, radiofrequency ablation and balloon kyphoplasty with improved pain control and mobility.   --Plan for CIR --TLSO brace, steroids as needed, but no other acute intervention.  --Continue analgesia with MS Contin and immediately release morphine for breakthrough   Metastatic lung cancer --New diagnosis.  Metastatic to bone and brain.   --PET scan notable for 2 hypermetabolic right pulmonary nodules and adenopathy which may relate to metastatic bronchogenic carcinoma. --MRI brain notable for punctate lesions right cerebellum and left temporal lobe consistent with metastatic disease --Discussed with Dr. Chryl Heck, no role for inpatient chemotherapy, does recommend referral to radiation oncology and outpatient follow-up.   Abnormal appearance of the lungs on PET scan with groundglass opacities that are hypermetabolic.  Discussed with pulmonary medicine, in the absence of fever, cough, hypoxia or respiratory signs or symptoms, recommends  observation for now.    Uncontrolled diabetes mellitus type 2 associated with hyperglycemia  --Hemoglobin A1c 13  -- CBG running high, increase Lantus, add meal coverage, add night coverage, continue sliding scale insulin   Hypomagnesemia -- Resolved   Hypokalemia --Resolved   Depression and Anxiety --Continue Lexapro and Trazodone   Hyperlipidemia --Continue Lipitor   Normocytic Anemia --Stable.  Follow-up as an outpatient.   Subjective:  Feels ok but has left thigh pain      Subjective:  Appetite poor but overall better. Less left thigh pain.  Physical Exam: Vitals:   08/06/22 0427 08/06/22 0823 08/06/22 1234 08/06/22 1553  BP:  98/69 99/66 128/71  Pulse:  91 100 (!) 106  Resp:  15 17 20   Temp:  98.3 F (36.8 C) 97.7 F (36.5 C) 99.1 F (37.3 C)  TempSrc:  Oral Oral Oral  SpO2:  95% 96% 94%  Weight: 27.2 kg     Height:       Physical Exam Vitals reviewed.  Constitutional:      General: He is not in acute distress.    Appearance: He is not ill-appearing or toxic-appearing.  Cardiovascular:     Rate and Rhythm: Normal rate and regular rhythm.     Heart sounds: No murmur heard. Pulmonary:     Effort: Pulmonary effort is normal. No respiratory distress.     Breath sounds: No wheezing, rhonchi or rales.  Musculoskeletal:     Right lower leg: No edema.     Left lower leg: No edema.     Comments: Moves both legs  Neurological:     Mental Status: He is alert.  Psychiatric:        Mood and Affect: Mood normal.        Behavior: Behavior  normal.     Data Reviewed:  CG 340 this AM  Family Communication:   Disposition: Status is: Inpatient Remains inpatient appropriate because: awaiting CIR  Planned Discharge Destination:  CIR    Time spent: 20 minutes  Author: Murray Hodgkins, MD 08/06/2022 5:51 PM  For on call review [email or web address].

## 2022-08-07 DIAGNOSIS — E119 Type 2 diabetes mellitus without complications: Secondary | ICD-10-CM | POA: Diagnosis not present

## 2022-08-07 DIAGNOSIS — C3490 Malignant neoplasm of unspecified part of unspecified bronchus or lung: Secondary | ICD-10-CM | POA: Diagnosis not present

## 2022-08-07 DIAGNOSIS — C7951 Secondary malignant neoplasm of bone: Secondary | ICD-10-CM | POA: Diagnosis not present

## 2022-08-07 DIAGNOSIS — E43 Unspecified severe protein-calorie malnutrition: Secondary | ICD-10-CM

## 2022-08-07 DIAGNOSIS — C7931 Secondary malignant neoplasm of brain: Secondary | ICD-10-CM | POA: Diagnosis not present

## 2022-08-07 LAB — GLUCOSE, CAPILLARY
Glucose-Capillary: 83 mg/dL (ref 70–99)
Glucose-Capillary: 137 mg/dL — ABNORMAL HIGH (ref 70–99)
Glucose-Capillary: 190 mg/dL — ABNORMAL HIGH (ref 70–99)

## 2022-08-07 NOTE — Progress Notes (Signed)
Progress Note   Patient: Samuel [name] II QMG:[id number] DOB: 19-Dec-1965 DOA: 07/28/2022     9 DOS: the patient was seen and examined on 08/07/2022   Brief hospital course: 56 year old man first present with back pain 5 months ago was found to have metastatic disease to the spine.  Care fragmented and incomplete secondary to social and financial constraints.  Imaging revealed metastatic deposit L2 which had progressed to compression fracture, spinal canal stenosis and lower extremity weakness.  Patient underwent CT-guided biopsy of L2 coupled followed by radiofrequency ablation and balloon kyphoplasty with clinical improvement.  Biopsy results notable for likely lung primary.  Completed PET scan 9/14.  Plan for CIR.  Medically stable.  Assessment and Plan: Intractable lumbar pain secondary to metastatic disease of uncertain etiology, with L2 pathologic compression fracture, pathology consistent with lung origin; pain secondary to malignancy -- Status post L2 biopsy, radiofrequency ablation and balloon kyphoplasty with improved pain control and mobility.   --Plan for CIR --TLSO brace, steroids as needed, but no other acute intervention.  --Continue analgesia with MS Contin and immediately release morphine for breakthrough   Metastatic lung cancer --New diagnosis.  Metastatic to bone and brain.   --PET scan notable for 2 hypermetabolic right pulmonary nodules and adenopathy which may relate to metastatic bronchogenic carcinoma. --MRI brain notable for punctate lesions right cerebellum and left temporal lobe consistent with metastatic disease --Discussed with Dr. Chryl Heck, no role for inpatient chemotherapy, does recommend referral to radiation oncology and outpatient follow-up.   Abnormal appearance of the lungs on PET scan with groundglass opacities that are hypermetabolic.  Discussed with pulmonary medicine, in the absence of fever, cough, hypoxia or respiratory signs or symptoms, recommends  observation for now. --remains asymptomatic    Uncontrolled diabetes mellitus type 2 associated with hyperglycemia  --Hemoglobin A1c 13  -- CBG stable. Continue Lantus, meal coverage, night coverage, sliding scale insulin   Hypomagnesemia -- Resolved   Hypokalemia --Resolved   Depression and Anxiety --Continue Lexapro and Trazodone   Hyperlipidemia --Continue Lipitor   Normocytic Anemia --Stable.  Follow-up as an outpatient.       Subjective:  Left thigh pain stable, has some numbness at times Overall pain is stable, but has some breakthrough and hydromorphone not helping  Physical Exam: Vitals:   08/06/22 2000 08/07/22 0000 08/07/22 0400 08/07/22 0739  BP: 115/78 103/70 97/71 108/74  Pulse: 94 96 99 91  Resp: 18 18 18 19   Temp: 99.8 F (37.7 C) 99.2 F (37.3 C) 99.4 F (37.4 C) 98.7 F (37.1 C)  TempSrc: Oral Oral Axillary Oral  SpO2: 95% 94% 93% 94%  Weight:      Height:       Physical Exam Vitals reviewed.  Constitutional:      General: He is not in acute distress.    Appearance: He is not ill-appearing or toxic-appearing.  Cardiovascular:     Rate and Rhythm: Normal rate and regular rhythm.     Heart sounds: No murmur heard. Pulmonary:     Effort: Pulmonary effort is normal. No respiratory distress.     Breath sounds: No wheezing, rhonchi or rales.  Neurological:     Mental Status: He is alert.  Psychiatric:        Mood and Affect: Mood normal.        Behavior: Behavior normal.     Data Reviewed:  CBG stable  Family Communication:   Disposition: Status is: Inpatient Remains inpatient appropriate because: needs rehab  Planned  Discharge Destination:  CIR    Time spent: 20 minutes  Author: Murray Hodgkins, MD 08/07/2022 12:16 PM  For on call review [email or web address].

## 2022-08-07 NOTE — Progress Notes (Signed)
Mobility Specialist Progress Note:   08/07/22 1628  Mobility  Activity Refused mobility   Pt refused mobility d/t being up in the chair all day. Pt requests earlier time to be seen. Will f/u as able.    Samuel [name] Mobility Specialist-Acute Rehab Secure Chat only

## 2022-08-07 NOTE — Plan of Care (Signed)

## 2022-08-08 ENCOUNTER — Inpatient Hospital Stay: Payer: Medicaid Other | Attending: Radiation Oncology

## 2022-08-08 DIAGNOSIS — C7931 Secondary malignant neoplasm of brain: Secondary | ICD-10-CM | POA: Diagnosis not present

## 2022-08-08 DIAGNOSIS — C7951 Secondary malignant neoplasm of bone: Secondary | ICD-10-CM | POA: Diagnosis not present

## 2022-08-08 DIAGNOSIS — C3490 Malignant neoplasm of unspecified part of unspecified bronchus or lung: Secondary | ICD-10-CM | POA: Diagnosis not present

## 2022-08-08 LAB — GLUCOSE, CAPILLARY
Glucose-Capillary: 118 mg/dL — ABNORMAL HIGH (ref 70–99)
Glucose-Capillary: 197 mg/dL — ABNORMAL HIGH (ref 70–99)
Glucose-Capillary: 208 mg/dL — ABNORMAL HIGH (ref 70–99)
Glucose-Capillary: 232 mg/dL — ABNORMAL HIGH (ref 70–99)
Glucose-Capillary: 234 mg/dL — ABNORMAL HIGH (ref 70–99)

## 2022-08-08 NOTE — Plan of Care (Signed)

## 2022-08-08 NOTE — Progress Notes (Signed)
Inpatient Rehab Admissions Coordinator:   Per Dr. Sarajane Jews all oncology workup to be completed outpatient.  Will open insurance today once PT/OT have a chance to see patient and document.   Shann Medal, PT, DPT Admissions Coordinator [phone] 08/08/22  9:53 AM

## 2022-08-08 NOTE — Progress Notes (Signed)
Mobility Specialist: Progress Note   08/08/22 1034  Mobility  Activity Ambulated with assistance in hallway  Level of Assistance Contact guard assist, steadying assist  Assistive Device Front wheel walker  Distance Ambulated (ft) 130 ft  Activity Response Tolerated well  $Mobility charge 1 Mobility   Pt received in the bed and agreeable to mobility. Distance limited secondary to 9.5/10 LLE pain. Pt to the chair after session per request with call bell and phone at his side.  [facility] Patte Winkel Mobility Specialist Mobility Specialist [address]: [phone]

## 2022-08-08 NOTE — Progress Notes (Signed)
Occupational Therapy Treatment Patient Details Name: Samuel [name] MRN: [id number] DOB: 31-Mar-1966 Today's Date: 08/08/2022   History of present illness 56 yo male with onset of intractible back pain with spine mets (unsure of primary per hx) was admitted on 9/7, now has plan for repletion of electrolytes, pain management and has pending plan for biopsy of lumbar spine and potential kyphoplasty.  Pain is in R hip and leg, low back and upper back at times.  Imaging has found mult thoracic spine spots that may be mets. 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 PMHx:  DM, pancreatitis, smoker, R rib and clavicle fractures,   OT comments  Pt seated in recliner, agreeable to OT session. Completing transfers with min guard, mobility with min guard using RW in room.  Engaged in standing grooming task x 1 (washing face) min assist, highly anxious without BUE support and reports increased pain.  Returned to sitting to complete grooming and UB bathing, with setup.  Pt educated on compensatory lateral lean strategies for LB bathing/dressing, voiced understanding.  Highly recommend AIR to optimize tolerance, strength, balance for ADL participation.     Recommendations for follow up therapy are one component of a multi-disciplinary discharge planning process, led by the attending physician.  Recommendations may be updated based on patient status, additional functional criteria and insurance authorization.    Follow Up Recommendations  Acute inpatient rehab (3hours/day)    Assistance Recommended at Discharge Intermittent Supervision/Assistance  Patient can return home with the following  A little help with walking and/or transfers;A little help with bathing/dressing/bathroom;Assistance with cooking/housework;Assist for transportation;Help with stairs or ramp for entrance   Equipment Recommendations  Other (comment)    Recommendations for Other Services Rehab consult    Precautions / Restrictions  Precautions Precautions: Fall;Back Precaution Booklet Issued: No Precaution Comments: reviewed precautions, no recall Required Braces or Orthoses: Spinal Brace Spinal Brace: Thoracolumbosacral orthotic;Applied in sitting position Restrictions Weight Bearing Restrictions: No       Mobility Bed Mobility Overal bed mobility: Needs Assistance Bed Mobility: Sit to Sidelying, Rolling Rolling: Supervision       Sit to sidelying: Supervision General bed mobility comments: log roll technique    Transfers Overall transfer level: Needs assistance Equipment used: Rolling walker (2 wheels) Transfers: Sit to/from Stand Sit to Stand: Min guard           General transfer comment: min guard for safety, hand placement     Balance Overall balance assessment: Needs assistance Sitting-balance support: No upper extremity supported, Feet supported Sitting balance-Leahy Scale: Good Sitting balance - Comments: limited dynamically, cueing for precautions   Standing balance support: Bilateral upper extremity supported, During functional activity Standing balance-Leahy Scale: Poor Standing balance comment: needs UE support, able to briefly engage in ADLs with 1 UE support                           ADL either performed or assessed with clinical judgement   ADL Overall ADL's : Needs assistance/impaired     Grooming: Standing;Min guard;Wash/dry face Grooming Details (indicate cue type and reason): min guard washing face using R UE, preference to BUE support and demonstrates greatly increased anxiety with tasks completed standing; completed sitting Upper Body Bathing: Set up;Sitting       Upper Body Dressing : Sitting;Minimal assistance Upper Body Dressing Details (indicate cue type and reason): min assist for brace in chair seated, due to pain and anxiety     Toilet Transfer: Minimal  assistance;Ambulation;Rolling walker (2 wheels) Toilet Transfer Details (indicate cue type  and reason): simulated in room         Functional mobility during ADLs: Minimal assistance;Rolling walker (2 wheels) General ADL Comments: limited by pain in back, anxiety with standing ADLs without BUE support; educated on lateral leans for LB bathing/dressing    Extremity/Trunk Assessment              Vision       Perception     Praxis      Cognition Arousal/Alertness: Awake/alert Behavior During Therapy: WFL for tasks assessed/performed, Anxious Overall Cognitive Status: Impaired/Different from baseline Area of Impairment: Memory                     Memory: Decreased recall of precautions         General Comments: decreased recall of back precautions, reviewed with pt; anxious during ADLs in standing        Exercises      Shoulder Instructions       General Comments VSS    Pertinent Vitals/ Pain       Pain Assessment Pain Assessment: 0-10 Faces Pain Scale: Hurts whole lot Pain Location: back Pain Descriptors / Indicators: Aching, Discomfort, Grimacing Pain Intervention(s): Limited activity within patient's tolerance, Monitored during session, Repositioned, Premedicated before session  Home Living                                          Prior Functioning/Environment              Frequency  Min 2X/week        Progress Toward Goals  OT Goals(current goals can now be found in the care plan section)  Progress towards OT goals: Progressing toward goals  Acute Rehab OT Goals Patient Stated Goal: less pain OT Goal Formulation: With patient Time For Goal Achievement: 08/17/22 Potential to Achieve Goals: Good  Plan Discharge plan remains appropriate;Frequency remains appropriate    Co-evaluation                 AM-PAC OT "6 Clicks" Daily Activity     Outcome Measure   Help from another person eating meals?: None Help from another person taking care of personal grooming?: A Little Help from another  person toileting, which includes using toliet, bedpan, or urinal?: A Lot Help from another person bathing (including washing, rinsing, drying)?: A Little Help from another person to put on and taking off regular upper body clothing?: A Little Help from another person to put on and taking off regular lower body clothing?: A Little 6 Click Score: 18    End of Session Equipment Utilized During Treatment: Rolling walker (2 wheels);Back brace  OT Visit Diagnosis: Other abnormalities of gait and mobility (R26.89);Muscle weakness (generalized) (M62.81);Pain Pain - Right/Left: Left Pain - part of body: Leg   Activity Tolerance Patient tolerated treatment well   Patient Left in bed;with call bell/phone within reach   Nurse Communication Other (comment) (defer)        Time: 1660-6301 OT Time Calculation (min): 22 min  Charges: OT General Charges $OT Visit: 1 Visit OT Treatments $Self Care/Home Management : 8-22 mins  Jolaine Artist, [place] [phone]   Delight Stare 08/08/2022, 1:47 PM

## 2022-08-08 NOTE — Progress Notes (Signed)
Progress Note   Patient: Samuel [name] FMB:[id number] DOB: [birth year] DOA: 07/28/2022     10 DOS: the patient was seen and examined on 08/08/2022   Brief hospital course: 56 year old man first present with back pain 5 months ago was found to have metastatic disease to the spine.  Care fragmented and incomplete secondary to social and financial constraints.  Imaging revealed metastatic deposit L2 which had progressed to compression fracture, spinal canal stenosis and lower extremity weakness.  Patient underwent CT-guided biopsy of L2 coupled followed by radiofrequency ablation and balloon kyphoplasty with clinical improvement.  Biopsy results notable for likely lung primary.  Completed PET scan 9/14.  Plan for CIR.  Medically stable.  Assessment and Plan: Intractable lumbar pain secondary to metastatic disease of uncertain etiology, with L2 pathologic compression fracture, pathology consistent with lung origin; pain secondary to malignancy -- Status post L2 biopsy, radiofrequency ablation and balloon kyphoplasty with improved pain control and mobility.   --Plan for CIR --TLSO brace, steroids as needed, but no other acute intervention.  --Continue analgesia with MS Contin and immediately release morphine for breakthrough --Possible radiation to the spine as an outpatient.   Metastatic lung cancer --New diagnosis.  Metastatic to bone and brain.   --PET scan notable for 2 hypermetabolic right pulmonary nodules and adenopathy which may relate to metastatic bronchogenic carcinoma. --MRI brain notable for punctate lesions right cerebellum and left temporal lobe consistent with metastatic disease --Discussed with Dr. Chryl Heck, no role for inpatient chemotherapy, follow-up as an outpatient --Discussed with Dr. Isidore Moos.  Outpatient follow-up for radiation therapy to the brain and possibly spine.  No emergent radiation indicated.   Abnormal appearance of the lungs on PET scan with groundglass opacities  that are hypermetabolic.  Discussed with pulmonary medicine, in the absence of fever, cough, hypoxia or respiratory signs or symptoms, recommends observation for now. --remains asymptomatic, no hypoxia, no further evaluation suggested at this point    Uncontrolled diabetes mellitus type 2 associated with hyperglycemia  --Hemoglobin A1c 13  -- CBG stable. Continue Lantus, meal coverage, night coverage, sliding scale insulin   Hypomagnesemia -- Resolved   Hypokalemia --Resolved   Depression and Anxiety --Continue Lexapro and Trazodone   Hyperlipidemia --Continue Lipitor   Normocytic Anemia --Stable.  Follow-up as an outpatient.       Subjective:  Feels tired, poor appetite. Pain better controlled.  Physical Exam: Vitals:   08/08/22 0000 08/08/22 0400 08/08/22 0820 08/08/22 1220  BP: 102/70 99/68 105/74 102/74  Pulse: 91 94 98 97  Resp: 18 18 18 18   Temp: 98.1 F (36.7 C) 97.6 F (36.4 C) 98.5 F (36.9 C) 98.7 F (37.1 C)  TempSrc: Axillary Axillary Oral Oral  SpO2: 95% 95% 95% 98%  Weight:      Height:       Physical Exam Vitals reviewed.  Constitutional:      General: He is not in acute distress.    Appearance: He is not ill-appearing or toxic-appearing.  Cardiovascular:     Rate and Rhythm: Normal rate and regular rhythm.     Heart sounds: No murmur heard. Pulmonary:     Effort: Pulmonary effort is normal. No respiratory distress.     Breath sounds: No wheezing, rhonchi or rales.  Neurological:     Mental Status: He is alert.  Psychiatric:        Mood and Affect: Mood normal.        Behavior: Behavior normal.     Data Reviewed:  CBG stable  Family Communication:   Disposition: Status is: Inpatient Remains inpatient appropriate because: await rehab  Planned Discharge Destination:  CIR    Time spent: 20 minutes  Author: Murray Hodgkins, MD 08/08/2022 2:09 PM  For on call review [email or web address].

## 2022-08-08 NOTE — Progress Notes (Signed)
PT Cancellation Note  Patient Details Name: Samuel [name] MRN: [id number] DOB: 10/18/1966   Cancelled Treatment:    Reason Eval/Treat Not Completed: Other (comment). Pt politely declined as he just returned to bed after bathing with OT while standing as sink. Pt also ambulated with the mobility specialist this morning as well. PT to return as able to progress ambulation.  Kittie Plater, PT, DPT Acute Rehabilitation Services Secure chat preferred Office #: [phone]    Berline Lopes 08/08/2022, 2:19 PM

## 2022-08-09 DIAGNOSIS — C3490 Malignant neoplasm of unspecified part of unspecified bronchus or lung: Secondary | ICD-10-CM | POA: Diagnosis not present

## 2022-08-09 DIAGNOSIS — C7931 Secondary malignant neoplasm of brain: Secondary | ICD-10-CM | POA: Diagnosis not present

## 2022-08-09 DIAGNOSIS — C7951 Secondary malignant neoplasm of bone: Secondary | ICD-10-CM | POA: Diagnosis not present

## 2022-08-09 LAB — GLUCOSE, CAPILLARY
Glucose-Capillary: 243 mg/dL — ABNORMAL HIGH (ref 70–99)
Glucose-Capillary: 106 mg/dL — ABNORMAL HIGH (ref 70–99)
Glucose-Capillary: 144 mg/dL — ABNORMAL HIGH (ref 70–99)
Glucose-Capillary: 214 mg/dL — ABNORMAL HIGH (ref 70–99)

## 2022-08-09 MED ORDER — ENSURE ENLIVE PO LIQD
237.0000 mL | Freq: Three times a day (TID) | ORAL | Status: DC
  Administered 2022-08-09 – 2022-08-16 (×19): 237 mL via ORAL

## 2022-08-09 MED ORDER — INSULIN GLARGINE-YFGN 100 UNIT/ML ~~LOC~~ SOLN
40.0000 [IU] | Freq: Every day | SUBCUTANEOUS | Status: DC
  Administered 2022-08-10: 40 [IU] via SUBCUTANEOUS
  Filled 2022-08-09 (×2): qty 0.4

## 2022-08-09 NOTE — Consult Note (Signed)
[place] [phone]   [phone]  CONSULT NOTE  REFERRING PHYSICIAN: Dr. Murray Hodgkins  REASON FOR CONSULTATION:  56 years old African-American male recently diagnosed with lung cancer.  HPI Samuel [name] is a 56 y.o. male with past medical history significant for diabetes mellitus as well as history of pancreatitis.  The patient lives in [place].  He has been complaining of pain in the back and weakness in his lower extremities.  He presented to the emergency department at [facility] on 07/17/2022 and MRI of the lumbar spine showed worsening pathologic fracture of the L2 vertebral body with greater than 50% vertebral body height loss and approximately 5 mm of retropulsion.  The right L2 pedicle is also pathological fracture with intra-articular extension into the right L1-2 facet joints.  There was a lytic lesion centered in the right L3 pedicle with supple pathologic fracture new from Apr 04, 2022.  There was also a lytic lesion centered within the spinous process of the L4 vertebral body with involvement of the bilateral lamina and canal stenosis at the L2 vertebral body level secondary to bony retropulsion.  Repeat MRI of the thoracolumbar spine on 07/29/2022 showed progression of the previously noted pathologic fracture at L2 with approximately 40% vertebral body height loss and 7 mm retropulsion causing moderate spinal canal stenosis posterior to the L2 vertebral body likely compresses the descending right greater than left L2 nerve roots.  This is also because moderate right and mild left neural foraminal narrowing at L2-L3.  There was abnormal signal throughout the L2 vertebral body and right greater than left posterior elements most likely representing metastatic disease with myeloma or lymphoma felt to be less likely.  On 08/01/2022 the patient underwent L2 core biopsy and radiofrequency ablation and balloon kyphoplasty  under the care of Dr. Norma Fredrickson from interventional radiology.  The final pathology ([id number]) showed metastatic carcinoma involving the bone and the characteristics are consistent with metastatic adenocarcinoma of the lung. The tumor cells are CKAE1/AE3 positive (epithelial marker) and positive for TTF-1 and CK7.  The tumor cells are negative for CK20, MelanA and Prostein.  This immunoprofile is characteristic for primary adenocarcinoma of the lung. On 08/04/2022 the patient had a PET scan and that showed the known osseous metastatic disease in the lumbar spine significantly hypermetabolic.  There was interval development of widespread groundglass opacities through both lungs with moderate bilateral pleural effusion most likely reflecting viral infection or drug reaction.  This pulmonary findings Limited assessment of the lung but there are at least 2 hypermetabolic right pulmonary nodules as well as hypermetabolic mediastinal and right hilar adenopathy which may relate to metastatic bronchogenic carcinoma.  There was also hypermetabolic activity in the gastrohepatic ligament likely related to a small lymph node and no evidence of metastatic disease within the liver or the adrenal glands.  The patient also had MRI of the brain on 08/04/2022 and it showed punctate contrast-enhancing lesions of the right cerebellum and left temporal lobe consistent with metastatic disease.  A third small right occipital pole enhancing metastasis is also identified and the lesion is 4 mm in diameter and there is mild regional edema. The patient was seen by Dr. Tammi Klippel for consideration of palliative radiotherapy to the back as well as the multiple brain lesions. I was asked to see the patient today for evaluation and establish care with medical oncology for future treatment options. When seen today he is feeling fine except  for the back pain and weakness in the lower extremities is much better.  He denied having any current  chest pain but has mild shortness of breath with mild cough and no hemoptysis.  He has no nausea, vomiting, diarrhea or constipation.  He has no headache or visual changes.  He lost few pounds recently. Family history is unremarkable. The patient is single and has no children.  He did have a history of smoking for over 40 years and unfortunately continues to smoke.  He also has a history of alcohol abuse in the past. 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 No pertinent family history.  Social History Social History   Tobacco Use   Smoking status: Every Day    Packs/day: 1.00    Types: Cigarettes   Smokeless tobacco: Never  Substance Use Topics   Alcohol use: Not Currently    Alcohol/week: 50.0 standard drinks of alcohol    Types: 50 Cans of beer per week    Comment: last thursday - a couple of beers   Drug use: No    Allergies  Allergen Reactions   Penicillins Hives   Percocet [Oxycodone-Acetaminophen] Nausea Only and Other (See Comments)    Pt states the pill is hard to swallow    Current Facility-Administered Medications  Medication Dose Route Frequency Provider Last Rate Last Admin   acetaminophen (TYLENOL) tablet 650 mg  650 mg Oral Q6H PRN Howerter, Justin B, DO   650 mg at 08/07/22 1341   Or   acetaminophen (TYLENOL) suppository 650 mg  650 mg Rectal Q6H PRN Howerter, Justin B, DO       atorvastatin (LIPITOR) tablet 40 mg  40 mg Oral Daily Samella Parr, NP   40 mg at 08/09/22 0808   cyclobenzaprine (FLEXERIL) tablet 10 mg  10 mg Oral Q12H PRN Samella Parr,  NP   10 mg at 08/05/22 2344   docusate sodium (COLACE) capsule 100 mg  100 mg Oral BID Samella Parr, NP   100 mg at 08/09/22 0808   enoxaparin (LOVENOX) injection 30 mg  30 mg Subcutaneous Daily Reome, Earle J, RPH   30 mg at 08/09/22 0807   escitalopram (LEXAPRO) tablet 20 mg  20 mg Oral Daily Samella Parr, NP   20 mg at 08/09/22 0808   feeding supplement (ENSURE ENLIVE / ENSURE PLUS) liquid 237 mL  237 mL Oral TID BM Samuella Cota, MD       gabapentin (NEURONTIN) capsule 300 mg  300 mg Oral TID Samella Parr, NP   300 mg at 08/09/22 1507   HYDROmorphone (DILAUDID) injection 0.5 mg  0.5 mg Intravenous Q2H PRN Samuella Cota, MD   0.5 mg at 08/09/22 1207   hydrOXYzine (ATARAX) tablet 25 mg  25 mg Oral Q6H PRN Samella Parr, NP   25 mg at 08/01/22 2056   insulin aspart (novoLOG) injection 0-15 Units  0-15 Units Subcutaneous TID WC Samuella Cota, MD   5 Units at 08/09/22 0726   insulin aspart (novoLOG) injection  0-5 Units  0-5 Units Subcutaneous QHS Samuella Cota, MD   2 Units at 08/08/22 2225   insulin aspart (novoLOG) injection 3 Units  3 Units Subcutaneous TID WC Samuella Cota, MD   3 Units at 08/09/22 0726   [START ON 08/10/2022] insulin glargine-yfgn (SEMGLEE) injection 40 Units  40 Units Subcutaneous Daily Samuella Cota, MD       iohexol (OMNIPAQUE) 300 MG/ML solution 100 mL  100 mL Per Tube Once PRN de Sindy Messing, Erven Colla, MD       lidocaine (LIDODERM) 5 % 1 patch  1 patch Transdermal Q24H Samella Parr, NP   1 patch at 08/09/22 0805   morphine (MS CONTIN) 12 hr tablet 15 mg  15 mg Oral Q12H Samella Parr, NP   15 mg at 08/09/22 0808   morphine (MSIR) tablet 15 mg  15 mg Oral Q4H PRN Samuella Cota, MD   15 mg at 08/09/22 1507   multivitamin with minerals tablet 1 tablet  1 tablet Oral Daily Raiford Noble Otway, DO   1 tablet at 08/09/22 1610   naloxone [facility]) injection 0.4 mg  0.4 mg Intravenous PRN Howerter, Justin B, DO        ondansetron (ZOFRAN) injection 4 mg  4 mg Intravenous Q6H PRN Howerter, Justin B, DO       Oral care mouth rinse  15 mL Mouth Rinse PRN Sheikh, Omair Latif, DO       polyethylene glycol (MIRALAX / GLYCOLAX) packet 17 g  17 g Oral QHS Samella Parr, NP   17 g at 08/08/22 2105   traZODone (DESYREL) tablet 50 mg  50 mg Oral QHS Samella Parr, NP   50 mg at 08/08/22 2104    Review of Systems  Constitutional: positive for fatigue and weight loss Eyes: negative Ears, nose, mouth, throat, and face: negative Respiratory: positive for cough and dyspnea on exertion Cardiovascular: negative Gastrointestinal: negative Genitourinary:negative Integument/breast: negative Hematologic/lymphatic: negative Musculoskeletal:positive for back pain Neurological: positive for weakness Behavioral/Psych: negative Endocrine: negative Allergic/Immunologic: negative  Physical Exam  RUE:AVWUJ, healthy, no distress, well nourished, and well developed SKIN: skin color, texture, turgor are normal, no rashes or significant lesions HEAD: Normocephalic, No masses, lesions, tenderness or abnormalities EYES: normal, PERRLA, Conjunctiva are pink and non-injected EARS: External ears normal, Canals clear OROPHARYNX:no exudate, no erythema, and lips, buccal mucosa, and tongue normal  NECK: supple, no adenopathy, no JVD LYMPH:  no palpable lymphadenopathy, no hepatosplenomegaly LUNGS: clear to auscultation , and palpation HEART: regular rate & rhythm, no murmurs, and no gallops ABDOMEN:abdomen soft, non-tender, normal bowel sounds, and no masses or organomegaly BACK: No CVA tenderness EXTREMITIES:no joint deformities, effusion, or inflammation, no edema  NEURO: alert & oriented x 3 with fluent speech, no focal motor/sensory deficits  PERFORMANCE STATUS: ECOG 1  LABORATORY DATA: Lab Results  Component Value Date   WBC 8.4 08/03/2022   HGB 10.3 (L) 08/03/2022   HCT 29.5 (L) 08/03/2022   MCV 93.9 08/03/2022    PLT 159 08/03/2022    '@LASTCHEM' @  RADIOGRAPHIC STUDIES: MR BRAIN W WO CONTRAST  Addendum Date: 08/07/2022   ADDENDUM REPORT: 08/07/2022 14:05 ADDENDUM: A THIRD small right occipital pole enhancing metastasis is identified on series 100, image 59 and series 9, image 47. This lesion is 4 mm diameter and there is mild regional edema on series 4, image 22. This will be discussed at [place] [facility] on 08/08/2022. Electronically Signed   By: Lemmie Evens  [place] Crane M.D.   On: 08/07/2022 14:05   Result Date: 08/07/2022 CLINICAL DATA:  Non-small cell lung carcinoma staging EXAM: MRI HEAD WITHOUT AND WITH CONTRAST TECHNIQUE: Multiplanar, multiecho pulse sequences of the brain and surrounding structures were obtained without and with intravenous contrast. CONTRAST:  4.8 mL gadopiclenol (Vueway) 0.5 mmol/ml solution COMPARISON:  None Available. FINDINGS: Brain: No acute infarct, mass effect or extra-axial collection. Single right cerebellar chronic microhemorrhage. Normal white matter signal, parenchymal volume and CSF spaces. The midline structures are normal. There are punctate contrast enhancing lesions of the right cerebellum and left temporal lobe (series 1100, images 117 and 147). Vascular: Major flow voids are preserved. Skull and upper cervical spine: Normal calvarium and skull base. Visualized upper cervical spine and soft tissues are normal. Sinuses/Orbits:No paranasal sinus fluid levels or advanced mucosal thickening. No mastoid or middle ear effusion. Normal orbits. IMPRESSION: Punctate contrast enhancing lesions of the right cerebellum and left temporal lobe, consistent with metastatic disease. Electronically Signed: By: Ulyses Jarred M.D. On: 08/05/2022 01:28   DG FEMUR MIN 2 VIEWS LEFT  Result Date: 08/05/2022 CLINICAL DATA:  Metastatic carcinoma, pain left thigh EXAM: LEFT FEMUR 2 VIEWS COMPARISON:  None Available. FINDINGS: No fracture or dislocation is seen. There are no aggressive  appearing focal lytic or sclerotic lesions. Small sclerotic density in neck of left femur may suggest benign bone island. In the PET-CT done on 08/04/2022, there was no hypermetabolic activity in the proximal left femur. IMPRESSION: No fracture or dislocation is seen. There is no evidence of any aggressive appearing lytic or sclerotic lesions. Electronically Signed   By: Elmer Picker M.D.   On: 08/05/2022 14:49   NM PET Image Initial (PI) Skull Base To Thigh  Result Date: 08/04/2022 CLINICAL DATA:  Initial treatment strategy for osseous metastatic disease. Adenocarcinoma on recent L2 biopsy. EXAM: NUCLEAR MEDICINE PET SKULL BASE TO THIGH TECHNIQUE: 5.32 mCi F-18 FDG was injected intravenously. Full-ring PET imaging was performed from the skull base to thigh after the radiotracer. CT data was obtained and used for attenuation correction and anatomic localization. Fasting blood glucose: 81 mg/dl COMPARISON:  MRI of the thoracolumbar spine 07/29/2022, chest CT 06/28/2022 and abdominopelvic CT 04/04/2022 and 05/05/2022. FINDINGS: Mediastinal blood pool activity: SUV max 0.9 NECK: No hypermetabolic cervical lymph nodes are identified.Fairly symmetric activity within the lymphoid tissue of Waldeyer's ring is within physiologic limits. No suspicious activity identified within the pharyngeal mucosal space. Incidental CT findings: none CHEST: There are multiple hypermetabolic mediastinal and right hilar lymph nodes. There is a high right paratracheal node with an SUV max of 8.9, a subcarinal node with an SUV max of 10.9 and a right hilar node with an SUV max of 13.6. No hypermetabolic axillary or left hilar adenopathy. Pulmonary assessment is limited by interval development of extensive ground-glass opacities in both lungs, demonstrating an upper lobe predominance and moderate bilateral pleural effusions. These pulmonary opacities are hypermetabolic (SUV max 8.2 in the right upper lobe), likely infectious. There  is a hypermetabolic solid nodule medially in the right upper lobe measuring 1.1 cm on image 30/7 (SUV max 6.2). There is a hypermetabolic nodule medially in the right lower lobe measuring 1.3 x 0.9 cm on image 36/7 (SUV max 5.1). Incidental CT findings: As above, since previous chest CT of 5 weeks ago, the patient has developed diffuse ground-glass opacities in both lungs with associated hypermetabolic activity. There are moderate bilateral pleural effusions with resulting compressive atelectasis in both lungs. Mild aortic atherosclerosis. ABDOMEN/PELVIS: There  is no hypermetabolic activity within the liver, adrenal glands, spleen or pancreas. Mild hypermetabolic activity in the gastrohepatic ligament (SUV max 4.9) which may correspond with proximal stomach or a small gastrohepatic ligament node. No other hypermetabolic or enlarged abdominopelvic lymph nodes are identified. No other suspicious bowel activity. Incidental CT findings: Limited by paucity of intra-abdominal fat. Aortic and branch vessel atherosclerosis with probable small nonobstructing right renal calculi. SKELETON: There is intense hypermetabolic activity associated with the known osseous metastatic disease within the lumbar spine. Patient has undergone interval L2 spinal augmentation. There is hypermetabolic activity in the vertebral body and posterior elements (SUV max 11.3). There is additional hypermetabolic metastatic disease within the L3 vertebral body and posterior elements at L4. No definite osseous metastases outside of the lumbar spine (see thoracic spine MRI report). Incidental CT findings: Cannot exclude residual significant mass effect on the spinal canal by the pathologic fracture at L2 (assessment limited by beam hardening artifact from the methylmethacrylate). IMPRESSION: 1. Known osseous metastatic disease in the lumbar spine is significantly hypermetabolic. Cannot exclude residual spinal stenosis at the L2 pathologic fracture post  spinal augmentation. 2. Interval development of widespread ground-glass opacities throughout both lungs with moderate bilateral pleural effusions, most likely reflecting viral infection or drug reaction. Correlate clinically. 3. These pulmonary findings limit assessment of the lungs, but there are at least 2 hypermetabolic right pulmonary nodules as well as hypermetabolic mediastinal and right hilar adenopathy which may relate to metastatic bronchogenic carcinoma. 4. Hypermetabolic activity in the gastrohepatic ligament, likely related to a small lymph node. No evidence of metastatic disease within the liver or adrenal glands. Electronically Signed   By: Richardean Sale M.D.   On: 08/04/2022 14:29   IR KYPHO LUMBAR INC FX REDUCE BONE BX UNI/BIL CANNULATION INC/IMAGING  Result Date: 08/01/2022 INDICATION: HERNANDEZ LOSASSO [name] is a 57 y.o. male with a past medical history significant for pancreatitis and diabetes mellitus. Patient reports back pain and weight loss for approximately 4 months. He presented to ED in May due to back pain and a CT of the abdomen showed a lytic L2 vertebral body lesion as well as an L4 spinous process lesion without intra-abdominal malignancy. A chest CT performed June 28, 2022 showed no definitive evidence of malignancy. Due to worsening back pain, patient again presented to ED on 07/17/2022. CT of the lumbar spine showed worsening presumed pathologic fracture of the L2 vertebral body with small retropulsion, extension into the posterior elements and more than 50% height loss, new lytic lesion in the L3 right pedicle and progression of the lytic lesion in the L4 spinous process, now involving the lamina bilaterally. Patient has persistent back pain severely limiting quality of life. During office visit on 07/22/2022, he rated the pain 12/10 when he wakes up with some improvement after he takes medication when it lowers to a more tolerable degree, with worsening to 7-8/10 when moving.  The pain radiates to the right leg and severely limits his ability to walk. On 07/29/2022, he presented again to ED and was admitted due to worsening pain and weakness. He comes to our service today for fluoroscopy guided L2 core biopsy, RFA and kyphoplasty. EXAM: FLUOROSCOPY GUIDED L2 CORE BONE BIOPSY, RADIOFREQUENCY ABLATION AND BALLOON KYPHOPLASTY COMPARISON:  MRI of the lumbar spine September 2023. MEDICATIONS: Vancomycin 1 g IV; The antibiotic was administered in an appropriate time interval prior to needle puncture of the skin. ANESTHESIA/SEDATION: Total intra-service Moderate Sedation Time: 60 minutes. A total of 2.5 mg of  Versed IV and 150 mcg of fentanyl IV were administered by the Radiology nurse monitored the patients level of consciousness and vital signs continuously throughout the procedure under my direct supervision. FLUOROSCOPY: Radiation Exposure Index (as provided by the fluoroscopic device): 9,379 mGy Kerma COMPLICATIONS: None immediate. TECHNIQUE: Informed written consent was obtained from the patient after a thorough discussion of the procedural risks, benefits and alternatives. All questions were addressed. Maximal Sterile Barrier Technique was utilized including caps, mask, sterile gowns, sterile gloves, sterile drape, hand hygiene and skin antiseptic. A timeout was performed prior to the initiation of the procedure. The patient was placed in prone position on the angiography table. The lumbar region was prepped and draped in a sterile fashion. Under fluoroscopy, the L2 vertebral body was delineated and the skin area was marked. The skin was infiltrated with a 1% Lidocaine approximately 5 cm lateral to the spinous process projection on the right. Using a 22-gauge spinal needle, the soft issue and the peripedicular space and periosteum were infiltrated with Bupivacaine 0.5%. A skin incision was made at the access site. Subsequently, an 11-gauge Kyphon trocar was inserted under fluoroscopic  guidance until contact with the pedicle was obtained. The trocar was inserted into the pedicle with light hammer tapping until the posterior boundaries of the vertebral body was reached. The diamond mandrill was removed and one core biopsy was obtained. The skin was infiltrated with a 1% Lidocaine approximately 4 cm lateral to the spinous process projection on the left. Using a 22-gauge spinal needle, the soft issue and the peripedicular space and periosteum were infiltrated with Bupivacaine 0.5%. A skin incision was made at the access site. Subsequently, an 11-gauge Kyphon trocar was inserted under fluoroscopic guidance until contact with the pedicle was obtained. The trocar was inserted with light hammer tapping into the pedicle until the posterior boundaries of the vertebral body was reached. A bone drill was coaxially advanced within the anterior third of the vertebral body on each side for proper RF probes size selection. An OsteoCool RF probe was inserted bilaterally within the mid-posterior third of the vertebral body. The radiofrequency ablation cycle was performed. Thereafter, the RF probes were exchanged for inflatable Kyphon balloons, which were centered within the mid-aspect of the vertebral body. The balloons were inflated to create a void to serve as a repository for the bone cement. Both balloons were deflated and through both cannulas, under continuous fluoroscopy guidance in the AP and lateral views, the vertebral body was filled with previously mixed polymethyl-methacrylate (PMMA) added to barium for opacification. Both cannulas were later removed. Post procedural radiographed shows evidence of significant cement extravasation. FINDINGS: Pathologic compression fracture of the L2 vertebral body with compromise of the right lateral wall and right pedicle. IMPRESSION: Successful L2 core biopsy followed by radiofrequency ablation and balloon kyphoplasty. Electronically Signed   By: Pedro Earls M.D.   On: 08/01/2022 14:48   IR Bone Tumor(s)RF Ablation  Result Date: 08/01/2022 INDICATION: Samuel KELEMEN [name] is a 56 y.o. male with a past medical history significant for pancreatitis and diabetes mellitus. Patient reports back pain and weight loss for approximately 4 months. He presented to ED in May due to back pain and a CT of the abdomen showed a lytic L2 vertebral body lesion as well as an L4 spinous process lesion without intra-abdominal malignancy. A chest CT performed June 28, 2022 showed no definitive evidence of malignancy. Due to worsening back pain, patient again presented to ED on 07/17/2022. CT  of the lumbar spine showed worsening presumed pathologic fracture of the L2 vertebral body with small retropulsion, extension into the posterior elements and more than 50% height loss, new lytic lesion in the L3 right pedicle and progression of the lytic lesion in the L4 spinous process, now involving the lamina bilaterally. Patient has persistent back pain severely limiting quality of life. During office visit on 07/22/2022, he rated the pain 12/10 when he wakes up with some improvement after he takes medication when it lowers to a more tolerable degree, with worsening to 7-8/10 when moving. The pain radiates to the right leg and severely limits his ability to walk. On 07/29/2022, he presented again to ED and was admitted due to worsening pain and weakness. He comes to our service today for fluoroscopy guided L2 core biopsy, RFA and kyphoplasty. EXAM: FLUOROSCOPY GUIDED L2 CORE BONE BIOPSY, RADIOFREQUENCY ABLATION AND BALLOON KYPHOPLASTY COMPARISON:  MRI of the lumbar spine September 2023. MEDICATIONS: Vancomycin 1 g IV; The antibiotic was administered in an appropriate time interval prior to needle puncture of the skin. ANESTHESIA/SEDATION: Total intra-service Moderate Sedation Time: 60 minutes. A total of 2.5 mg of Versed IV and 150 mcg of fentanyl IV were administered by the Radiology  nurse monitored the patients level of consciousness and vital signs continuously throughout the procedure under my direct supervision. FLUOROSCOPY: Radiation Exposure Index (as provided by the fluoroscopic device): 1,884 mGy Kerma COMPLICATIONS: None immediate. TECHNIQUE: Informed written consent was obtained from the patient after a thorough discussion of the procedural risks, benefits and alternatives. All questions were addressed. Maximal Sterile Barrier Technique was utilized including caps, mask, sterile gowns, sterile gloves, sterile drape, hand hygiene and skin antiseptic. A timeout was performed prior to the initiation of the procedure. The patient was placed in prone position on the angiography table. The lumbar region was prepped and draped in a sterile fashion. Under fluoroscopy, the L2 vertebral body was delineated and the skin area was marked. The skin was infiltrated with a 1% Lidocaine approximately 5 cm lateral to the spinous process projection on the right. Using a 22-gauge spinal needle, the soft issue and the peripedicular space and periosteum were infiltrated with Bupivacaine 0.5%. A skin incision was made at the access site. Subsequently, an 11-gauge Kyphon trocar was inserted under fluoroscopic guidance until contact with the pedicle was obtained. The trocar was inserted into the pedicle with light hammer tapping until the posterior boundaries of the vertebral body was reached. The diamond mandrill was removed and one core biopsy was obtained. The skin was infiltrated with a 1% Lidocaine approximately 4 cm lateral to the spinous process projection on the left. Using a 22-gauge spinal needle, the soft issue and the peripedicular space and periosteum were infiltrated with Bupivacaine 0.5%. A skin incision was made at the access site. Subsequently, an 11-gauge Kyphon trocar was inserted under fluoroscopic guidance until contact with the pedicle was obtained. The trocar was inserted with light  hammer tapping into the pedicle until the posterior boundaries of the vertebral body was reached. A bone drill was coaxially advanced within the anterior third of the vertebral body on each side for proper RF probes size selection. An OsteoCool RF probe was inserted bilaterally within the mid-posterior third of the vertebral body. The radiofrequency ablation cycle was performed. Thereafter, the RF probes were exchanged for inflatable Kyphon balloons, which were centered within the mid-aspect of the vertebral body. The balloons were inflated to create a void to serve as a repository for the  bone cement. Both balloons were deflated and through both cannulas, under continuous fluoroscopy guidance in the AP and lateral views, the vertebral body was filled with previously mixed polymethyl-methacrylate (PMMA) added to barium for opacification. Both cannulas were later removed. Post procedural radiographed shows evidence of significant cement extravasation. FINDINGS: Pathologic compression fracture of the L2 vertebral body with compromise of the right lateral wall and right pedicle. IMPRESSION: Successful L2 core biopsy followed by radiofrequency ablation and balloon kyphoplasty. Electronically Signed   By: Pedro Earls M.D.   On: 08/01/2022 14:48   MR Lumbar Spine W Wo Contrast  Result Date: 07/29/2022 CLINICAL DATA:  Worsening low back pain EXAM: MRI THORACIC AND LUMBAR SPINE WITHOUT AND WITH CONTRAST TECHNIQUE: Multiplanar and multiecho pulse sequences of the thoracic and lumbar spine were obtained without and with intravenous contrast. CONTRAST:  6.51m GADAVIST GADOBUTROL 1 MMOL/ML IV SOLN COMPARISON:  MRI lumbar spine 04/04/2022 FINDINGS: MRI THORACIC SPINE FINDINGS Alignment:  No significant listhesis. Vertebrae: Focal area of T1 hypointense and T2 hyperintense signal with contrast enhancement in the posteroinferior aspect of T9 (series 25, image 7) is concerning for osseous metastatic disease. No  acute fracture. Cord:  Normal signal and morphology.  No abnormal enhancement. Paraspinal and other soft tissues: Negative. Disc levels: No spinal canal stenosis or significant neural foraminal narrowing. MRI LUMBAR SPINE FINDINGS Segmentation:  Standard. Alignment: Straightening of the normal lumbar lordosis. No significant listhesis. Vertebrae: Decreased T1 signal, increased T2 signal and abnormal enhancement in the L2 vertebral body and right-greater-than-left posterior elements (series 6, images 6, 8, and 11), with pathologic compression fracture that has progressed compared to 04/04/2022, with approximately 40% vertebral body height loss and 7 mm of retropulsion. Additional abnormal signal is noted in the right posterosuperior aspect of L3, extending into the right pedicle and proximal spinous process (series 6, images 5-8); L4 posterior elements (series 6, images 4-13); and inferoposterior left aspect of L5 (series 6, image 10). Vertebral body heights are otherwise preserved. Conus medullaris: Extends to the L1 level and appears normal. No abnormal enhancement. Paraspinal and other soft tissues: Increased T2 signal in the left paraspinous musculature at L4, which may be inflammatory. Disc levels: T12-L1: No significant disc bulge. No spinal canal stenosis or neural foraminal narrowing. L1-L2: No significant disc bulge. No spinal canal stenosis at the disc space. No neural foraminal narrowing. Posterior to the L1 vertebral body, there is moderate spinal canal stenosis secondary to retropulsion. This likely compresses the descending right-greater-than-left L2 nerve roots. L2-L3: No significant disc bulge. No spinal canal stenosis. Mild left and moderate right neural foraminal narrowing, secondary to the vertebral body fracture and epidural extension of tumor. L3-L4: No significant disc bulge. No spinal canal stenosis or neural foraminal narrowing. L4-L5: Mild disc bulge. Narrowing of the left lateral recess,  which could affect the descending left L5 nerve roots. Mild-to-moderate left neural foraminal narrowing. L5-S1: Disc height loss with right paracentral, subarticular, and foraminal disc protrusion, which narrows the right lateral recess, likely affecting the descending right S1 nerve roots. No spinal canal stenosis. Mild right-greater-than-left neural foraminal narrowing. IMPRESSION: 1. Progression of the previously noted pathologic fracture at L2, with approximately 40% vertebral body height loss and 7 mm retropulsion causes moderate spinal canal stenosis posterior to the L2 vertebral body. This likely compresses the descending right-greater-than-left L2 nerve roots. This also causes moderate right and mild left neural foraminal narrowing at L2-L3. 2. Abnormal signal throughout the L2 vertebral body and right-greater-than-left posterior elements, most likely  representing metastatic disease with myeloma or lymphoma felt to be less likely. Additional osseous lesions in T9, L3, L4 and L5. No additional pathologic fracture. 3. Increased T2 signal in the left paraspinous musculature at L4, which may be inflammatory. 4. No additional spinal canal stenosis. L4-L5 mild-to-moderate left neural foraminal narrowing and L5-S1 mild bilateral neural foraminal narrowing. Electronically Signed   By: Merilyn Baba M.D.   On: 07/29/2022 02:35   MR THORACIC SPINE W WO CONTRAST  Result Date: 07/29/2022 CLINICAL DATA:  Worsening low back pain EXAM: MRI THORACIC AND LUMBAR SPINE WITHOUT AND WITH CONTRAST TECHNIQUE: Multiplanar and multiecho pulse sequences of the thoracic and lumbar spine were obtained without and with intravenous contrast. CONTRAST:  6.69m GADAVIST GADOBUTROL 1 MMOL/ML IV SOLN COMPARISON:  MRI lumbar spine 04/04/2022 FINDINGS: MRI THORACIC SPINE FINDINGS Alignment:  No significant listhesis. Vertebrae: Focal area of T1 hypointense and T2 hyperintense signal with contrast enhancement in the posteroinferior aspect of  T9 (series 25, image 7) is concerning for osseous metastatic disease. No acute fracture. Cord:  Normal signal and morphology.  No abnormal enhancement. Paraspinal and other soft tissues: Negative. Disc levels: No spinal canal stenosis or significant neural foraminal narrowing. MRI LUMBAR SPINE FINDINGS Segmentation:  Standard. Alignment: Straightening of the normal lumbar lordosis. No significant listhesis. Vertebrae: Decreased T1 signal, increased T2 signal and abnormal enhancement in the L2 vertebral body and right-greater-than-left posterior elements (series 6, images 6, 8, and 11), with pathologic compression fracture that has progressed compared to 04/04/2022, with approximately 40% vertebral body height loss and 7 mm of retropulsion. Additional abnormal signal is noted in the right posterosuperior aspect of L3, extending into the right pedicle and proximal spinous process (series 6, images 5-8); L4 posterior elements (series 6, images 4-13); and inferoposterior left aspect of L5 (series 6, image 10). Vertebral body heights are otherwise preserved. Conus medullaris: Extends to the L1 level and appears normal. No abnormal enhancement. Paraspinal and other soft tissues: Increased T2 signal in the left paraspinous musculature at L4, which may be inflammatory. Disc levels: T12-L1: No significant disc bulge. No spinal canal stenosis or neural foraminal narrowing. L1-L2: No significant disc bulge. No spinal canal stenosis at the disc space. No neural foraminal narrowing. Posterior to the L1 vertebral body, there is moderate spinal canal stenosis secondary to retropulsion. This likely compresses the descending right-greater-than-left L2 nerve roots. L2-L3: No significant disc bulge. No spinal canal stenosis. Mild left and moderate right neural foraminal narrowing, secondary to the vertebral body fracture and epidural extension of tumor. L3-L4: No significant disc bulge. No spinal canal stenosis or neural foraminal  narrowing. L4-L5: Mild disc bulge. Narrowing of the left lateral recess, which could affect the descending left L5 nerve roots. Mild-to-moderate left neural foraminal narrowing. L5-S1: Disc height loss with right paracentral, subarticular, and foraminal disc protrusion, which narrows the right lateral recess, likely affecting the descending right S1 nerve roots. No spinal canal stenosis. Mild right-greater-than-left neural foraminal narrowing. IMPRESSION: 1. Progression of the previously noted pathologic fracture at L2, with approximately 40% vertebral body height loss and 7 mm retropulsion causes moderate spinal canal stenosis posterior to the L2 vertebral body. This likely compresses the descending right-greater-than-left L2 nerve roots. This also causes moderate right and mild left neural foraminal narrowing at L2-L3. 2. Abnormal signal throughout the L2 vertebral body and right-greater-than-left posterior elements, most likely representing metastatic disease with myeloma or lymphoma felt to be less likely. Additional osseous lesions in T9, L3, L4 and L5. No additional  pathologic fracture. 3. Increased T2 signal in the left paraspinous musculature at L4, which may be inflammatory. 4. No additional spinal canal stenosis. L4-L5 mild-to-moderate left neural foraminal narrowing and L5-S1 mild bilateral neural foraminal narrowing. Electronically Signed   By: Merilyn Baba M.D.   On: 07/29/2022 02:35   DG Chest 2 View  Result Date: 07/28/2022 CLINICAL DATA:  Weakness EXAM: CHEST - 2 VIEW COMPARISON:  03/09/2021 FINDINGS: The heart size and mediastinal contours are within normal limits. Aortic atherosclerosis. Both lungs are clear. Multiple old right-sided rib fractures. 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 IMPRESSION: No active cardiopulmonary disease. Electronically Signed   By: Donavan Foil M.D.   On: 07/28/2022 20:35   CT Lumbar Spine Wo Contrast  Result Date: 07/17/2022 CLINICAL DATA:  Back pain.  Known  osseous metastatic disease EXAM: CT LUMBAR SPINE WITHOUT CONTRAST TECHNIQUE: Multidetector CT imaging of the lumbar spine was performed without intravenous contrast administration. Multiplanar CT image reconstructions were also generated. RADIATION DOSE REDUCTION: This exam was performed according to the departmental dose-optimization program which includes automated exposure control, adjustment of the mA and/or kV according to patient size and/or use of iterative reconstruction technique. COMPARISON:  MRI 04/04/2022, CT 06/28/2022, 05/05/2022 FINDINGS: Segmentation: 5 lumbar type vertebrae. Alignment: Normal. Vertebrae: Lytic lesion occupying the majority of the L2 vertebral body with involvement of the right pedicle, right transverse process, right lamina, and spinous process. Worsening pathologic fracture of the vertebral body, now with greater than 50% vertebral body height loss. Approximately 5 mm of retropulsion. The right L2 pedicle is also pathologically fractured with intra-articular extension into the right L1-2 facet joint. Lytic lesion centered in the right L3 pedicle with subtle pathologic fracture (series 3, image 62). This lesion is new from 04/04/2022. Lytic lesion centered within the spinous process of the L4 vertebral body with involvement of the bilateral lamina. Similar in appearance to prior. Small extraosseous soft tissue masses are seen associated with the lesion at the right L2 transverse process and pedicle as well as at the L4 spinous process. Paraspinal and other soft tissues: Punctate nonobstructing right renal calculi. Aortic atherosclerosis. Disc levels: Degenerative disc disease of L5-S1. Canal stenosis at the L2 vertebral body level secondary to bony retropulsion in presumed tumor involvement of the epidural space and right L2-3 neural foramina. IMPRESSION: 1. Worsening pathologic fracture of the L2 vertebral body, now with greater than 50% vertebral body height loss and approximately  5 mm of retropulsion. The right L2 pedicle is also pathologically fractured with intra-articular extension into the right L1-2 facet joint. 2. Lytic lesion centered in the right L3 pedicle with subtle pathologic fracture, new from 04/04/2022. 3. Lytic lesion centered within the spinous process of L4 vertebral body with involvement of the bilateral lamina, similar in appearance to prior. 4. Canal stenosis at the L2 vertebral body level secondary to bony retropulsion in presumed tumor involvement of the epidural space and right L2-3 neural foramina. Aortic Atherosclerosis (ICD10-I70.0). Electronically Signed   By: Davina Poke D.O.   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  PLAN: I had a lengthy discussion with the patient today  about his current disease stage, prognosis and treatment options. I personally and independently reviewed the scan images and discussed results with the patient today. I would send his tissue biopsy for molecular studies and PD-L1 expression by foundation 1. I recommended for the patient to proceed with the palliative radiotherapy to the brain lesion under the care of Dr. Tammi Klippel as previously planned. I will arrange for the patient a follow-up appointment with me at the [facility] in around 2 weeks for evaluation and more detailed discussion of his treatment options based on the final molecular studies and after improvement of his back pain and general condition. The patient is in agreement with the current plan. I strongly encouraged him to  quit smoking. The patient voices understanding of current disease status and treatment options and is in agreement with the current care plan.  All questions were answered. The patient knows to call the clinic with any problems, questions or concerns. We can certainly see the patient much sooner if necessary.  Thank you so much for allowing me to participate in the care of Samuel [name]. I will continue to follow up the patient with you and assist in his care.  Disclaimer: This note was dictated with voice recognition software. Similar sounding words can inadvertently be transcribed and may not be corrected upon review.   Eilleen Kempf August 09, 2022, 4:09 PM

## 2022-08-09 NOTE — Progress Notes (Addendum)
Nutrition Follow-up  DOCUMENTATION CODES:   Severe malnutrition in context of chronic illness  INTERVENTION:  Continue regular diet, double protein with meals Change glucerna to Ensure Enlive po TID, each supplement provides 350 kcal and 20 grams of protein. MVI with minerals daily PM Snack from dining services  NUTRITION DIAGNOSIS:   Severe Malnutrition related to chronic illness (cancer) as evidenced by severe fat depletion, severe muscle depletion. - new dx 9/19  GOAL:   Patient will meet greater than or equal to 90% of their needs - diet and supplements in place  MONITOR:   PO intake, Supplement acceptance, Labs, Weight trends, I & O's  REASON FOR ASSESSMENT:   Consult Assessment of nutrition requirement/status  ASSESSMENT:   56 y.o. male with medical history significant of diabetes mellitus on insulin, anxiety and depression, and diabetic neuropathy.  Patient was in the process of undergoing malignancy work-up.  He was found to have back pain 4 months ago and has been receiving fragmented and incomplete care due to his social and financial circumstances.  He is currently being followed by Dr. Tyler Pita radiation oncology.  Pt resting in chair at the time of visit. Just finished working with mobility. Pt reports appetite comes and goes but that he is eating much better now than prior to admission. Pt does not have stable housing and is food insecure. Reports over the last few months he went from 172 lb to 132 lb. Significant muscle and fat deficits present.   Discussed the importance of nutrition during cancer treatment. Pt does not like glucerna, but does drink ensure at home. Adjusted and encouraged consumption  Average Meal Intake: 9/11-9/16: 56% intake x 7 recorded meals  Nutritionally Relevant Medications: Scheduled Meds:  atorvastatin  40 mg Oral Daily   docusate sodium  100 mg Oral BID   GLUCERNA SHAKE  237 mL Oral TID BM   insulin aspart  0-15 Units  Subcutaneous TID WC   insulin aspart  0-5 Units Subcutaneous QHS   insulin aspart  3 Units Subcutaneous TID WC   multivitamin with minerals  1 tablet Oral Daily   polyethylene glycol  17 g Oral QHS   Labs Reviewed  NUTRITION - FOCUSED PHYSICAL EXAM:  Flowsheet Row Most Recent Value  Orbital Region Severe depletion  Upper Arm Region Severe depletion  Thoracic and Lumbar Region Severe depletion  Buccal Region Severe depletion  Temple Region Mild depletion  Clavicle Bone Region Severe depletion  Clavicle and Acromion Bone Region Severe depletion  Scapular Bone Region Severe depletion  Dorsal Hand Severe depletion  Patellar Region Severe depletion  Anterior Thigh Region Severe depletion  Posterior Calf Region Severe depletion  Edema (RD Assessment) Mild  Hair Reviewed  Eyes Reviewed  Mouth Reviewed  Skin Reviewed  Nails Reviewed    Diet Order:   Diet Order             Diet regular Room service appropriate? Yes; Fluid consistency: Thin  Diet effective now                   EDUCATION NEEDS:   Education needs have been addressed  Skin:  Skin Assessment: Skin Integrity Issues: Skin Integrity Issues:: Incisions Incisions: lower vertebral column  Last BM:  9/16  Height:   Ht Readings from Last 1 Encounters:  07/28/22 5\' 8"  (1.727 m)    Weight:   Wt Readings from Last 1 Encounters:  08/09/22 58.7 kg    Ideal Body Weight:  70  kg  BMI:  Body mass index is 19.68 kg/m.  Estimated Nutritional Needs:  Kcal:  1950-2150 Protein:  105-120 grams Fluid:  >1.9 L    Ranell Patrick, RD, LDN Clinical Dietitian RD pager # available in [place]  After hours/weekend pager # available in [facility]

## 2022-08-09 NOTE — Progress Notes (Signed)
Physical Therapy Treatment Patient Details Name: Samuel [name] MRN: [id number] DOB: 1966-09-22 Today's Date: 08/09/2022   History of Present Illness 56 yo male with onset of intractible back pain with spine mets (unsure of primary per hx) was admitted on 9/7, now has plan for repletion of electrolytes, pain management and has pending plan for biopsy of lumbar spine and potential kyphoplasty.  Pain is in R hip and leg, low back and upper back at times.  Imaging has found mult thoracic spine spots that may be mets. 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 PMHx:  DM, pancreatitis, smoker, R rib and clavicle fractures,    PT Comments    Focused on ambulation with less UE dependence. Pt progressively increased his distance her ambulated with elbows in natural flexion vs locked out in extension. Also emphasis on not having a "death grip" on the walker. Pt continue with L thigh pain however under better control. Pt chief concern is being able to standing without UE support to stand and urinate and complete ADLs at sink. Pt reports "I just can't get my balance." Pt to continue to benefit from AIR upon d/c to address these deficits for safe transition home. Acute PT to cont to follow.    Recommendations for follow up therapy are one component of a multi-disciplinary discharge planning process, led by the attending physician.  Recommendations may be updated based on patient status, additional functional criteria and insurance authorization.  Follow Up Recommendations  Acute inpatient rehab (3hours/day)     Assistance Recommended at Discharge Frequent or constant Supervision/Assistance  Patient can return home with the following Assistance with cooking/housework;Two people to help with bathing/dressing/bathroom;Assist for transportation   Equipment Recommendations  Rolling walker (2 wheels)    Recommendations for Other Services       Precautions / Restrictions Precautions Precautions: Fall;Back Precaution  Booklet Issued: No Precaution Comments: reviewed precautions, no recall Required Braces or Orthoses: Spinal Brace Spinal Brace: Thoracolumbosacral orthotic;Applied in sitting position Restrictions Weight Bearing Restrictions: No     Mobility  Bed Mobility Overal bed mobility: Needs Assistance Bed Mobility: Rolling, Sidelying to Sit Rolling: Supervision Sidelying to sit: Supervision       General bed mobility comments: log roll technique, supervision, use of bed rail    Transfers Overall transfer level: Needs assistance Equipment used: Rolling walker (2 wheels) Transfers: Sit to/from Stand Sit to Stand: Min guard           General transfer comment: min guard for safety, pt places L UE on arm rest of RW and pushes off bed with R UE    Ambulation/Gait Ambulation/Gait assistance: Min guard Gait Distance (Feet): 50 Feet (70x1, 100x1) Assistive device: Rolling walker (2 wheels) Gait Pattern/deviations: Step-through pattern, Decreased stride length Gait velocity: dec Gait velocity interpretation: <1.31 ft/sec, indicative of household ambulator   General Gait Details: focused on depending less on UEs and loosening grip on walker. Pt was able to progressively increased distance ambulated without bilat elbows locked in extension and less grip on RW, pt with onset of worsening L thigh pain with increasd amb distance   Stairs             Wheelchair Mobility    Modified Rankin (Stroke Patients Only)       Balance Overall balance assessment: Needs assistance Sitting-balance support: No upper extremity supported, Feet supported Sitting balance-Leahy Scale: Good Sitting balance - Comments: pt able to don brace on EOB without LOB   Standing balance support: Bilateral upper extremity supported  Standing balance-Leahy Scale: Poor Standing balance comment: needs bilat UE suppot due to onset of pain                            Cognition Arousal/Alertness:  Awake/alert Behavior During Therapy: WFL for tasks assessed/performed Overall Cognitive Status: Within Functional Limits for tasks assessed                                 General Comments: pt with some anxiety regarding ambulating with less dependency on UEs        Exercises      General Comments General comments (skin integrity, edema, etc.): VSS      Pertinent Vitals/Pain Pain Assessment Pain Assessment: 0-10 Pain Score: 7     Home Living                          Prior Function            PT Goals (current goals can now be found in the care plan section) Acute Rehab PT Goals Patient Stated Goal: return to indep Progress towards PT goals: Progressing toward goals    Frequency    Min 5X/week      PT Plan Current plan remains appropriate    Co-evaluation              AM-PAC PT "6 Clicks" Mobility   Outcome Measure  Help needed turning from your back to your side while in a flat bed without using bedrails?: A Little Help needed moving from lying on your back to sitting on the side of a flat bed without using bedrails?: A Little Help needed moving to and from a bed to a chair (including a wheelchair)?: A Little Help needed standing up from a chair using your arms (e.g., wheelchair or bedside chair)?: A Little Help needed to walk in hospital room?: A Lot Help needed climbing 3-5 steps with a railing? : A Lot 6 Click Score: 16    End of Session Equipment Utilized During Treatment: Gait belt Activity Tolerance: Patient tolerated treatment well Patient left: in chair;with call bell/phone within reach;with chair alarm set Nurse Communication: Mobility status PT Visit Diagnosis: Unsteadiness on feet (R26.81);Pain;Difficulty in walking, not elsewhere classified (R26.2) Pain - Right/Left: Left Pain - part of body:  (thigh)     Time: 5498-2641 PT Time Calculation (min) (ACUTE ONLY): 23 min  Charges:  $Gait Training: 23-37  mins                     Kittie Plater, PT, DPT Acute Rehabilitation Services Secure chat preferred Office #: [phone]    Berline Lopes 08/09/2022, 12:04 PM

## 2022-08-09 NOTE — Progress Notes (Signed)
Mobility Specialist: Progress Note   08/09/22 1555  Mobility  Activity Ambulated with assistance in hallway  Level of Assistance Contact guard assist, steadying assist  Assistive Device Front wheel walker  Distance Ambulated (ft) 220 ft  Activity Response Tolerated well  $Mobility charge 1 Mobility   Received pt in chair having no complaints and agreeable to mobility. Required verbal cues for upright posture. Pt was asymptomatic throughout ambulation and returned to room w/o fault. Left in chair w/ call bell in reach and all needs met.  [facility] Stavros Cail Mobility Specialist Mobility Specialist [address]: [phone]

## 2022-08-09 NOTE — Progress Notes (Signed)
Progress Note   Patient: Samuel [name] ZLD:[id number] DOB: 1966/01/06 DOA: 07/28/2022     11 DOS: the patient was seen and examined on 08/09/2022   Brief hospital course: 56 year old man first present with back pain 5 months ago was found to have metastatic disease to the spine.  Care fragmented and incomplete secondary to social and financial constraints.  Imaging revealed metastatic deposit L2 which had progressed to compression fracture, spinal canal stenosis and lower extremity weakness.  Patient underwent CT-guided biopsy of L2 coupled followed by radiofrequency ablation and balloon kyphoplasty with clinical improvement.  Biopsy results notable for likely lung primary.  Completed PET scan 9/14.  Plan for CIR.  Medically stable.  Assessment and Plan: Intractable lumbar pain secondary to metastatic disease of uncertain etiology, with L2 pathologic compression fracture, pathology consistent with lung origin; pain secondary to malignancy -- Status post L2 biopsy, radiofrequency ablation and balloon kyphoplasty with improved pain control and mobility.   --Plan for CIR --TLSO brace, steroids as needed, but no other acute intervention.  --Continue analgesia with MS Contin and immediately release morphine for breakthrough --Possible radiation to the spine as an outpatient.   Metastatic lung cancer --New diagnosis.  Metastatic to bone and brain.   --PET scan notable for 2 hypermetabolic right pulmonary nodules and adenopathy which may relate to metastatic bronchogenic carcinoma. --MRI brain notable for punctate lesions right cerebellum and left temporal lobe consistent with metastatic disease --Discussed with Dr. Chryl Heck, no role for inpatient chemotherapy, follow-up as an outpatient --Discussed with Dr. Isidore Moos.  Outpatient follow-up for radiation therapy to the brain and possibly spine.  No emergent radiation indicated.   Abnormal appearance of the lungs on PET scan with groundglass opacities  that are hypermetabolic.  Discussed with pulmonary medicine, in the absence of fever, cough, hypoxia or respiratory signs or symptoms, recommends observation for now. --remains asymptomatic, no hypoxia, no further evaluation suggested at this point    Uncontrolled diabetes mellitus type 2 associated with hyperglycemia  --Hemoglobin A1c 13  -- CBG stable. Continue Lantus, meal coverage, night coverage, sliding scale insulin   Hypomagnesemia -- Resolved   Hypokalemia --Resolved   Depression and Anxiety --Continue Lexapro and Trazodone   Hyperlipidemia --Continue Lipitor   Normocytic Anemia --Stable.  Follow-up as an outpatient.  Severe malnutrition in context of chronic illness Continue regular diet, double protein with meals Change glucerna to Ensure Enlive po TID, each supplement provides 350 kcal and 20 grams of protein. MVI with minerals daily PM Snack from dining services      Subjective:  Feels ok Pain controlled Reporting some presbyopia   Physical Exam: Vitals:   08/09/22 0018 08/09/22 0352 08/09/22 0729 08/09/22 1140  BP: (!) 89/63 97/65 101/66 123/89  Pulse: 96 92 87 94  Resp: 16 16 16 17   Temp: 98 F (36.7 C) 98.3 F (36.8 C) 97.9 F (36.6 C) 98.6 F (37 C)  TempSrc: Oral Oral Oral Oral  SpO2: 94% 95% 97% 99%  Weight:      Height:       Physical Exam Vitals reviewed.  Constitutional:      General: He is not in acute distress.    Appearance: He is not ill-appearing or toxic-appearing.  Cardiovascular:     Rate and Rhythm: Normal rate and regular rhythm.     Heart sounds: No murmur heard. Pulmonary:     Effort: Pulmonary effort is normal. No respiratory distress.     Breath sounds: No wheezing, rhonchi or rales.  Neurological:     Mental Status: He is alert.     Comments: Moves both legs to command  Psychiatric:        Mood and Affect: Mood normal.        Behavior: Behavior normal.     Data Reviewed:  CBG stable  Family Communication:  none  Disposition: Status is: Inpatient Remains inpatient appropriate because: needs rehab  Planned Discharge Destination:  rehab    Time spent: 20 minutes  Author: Murray Hodgkins, MD 08/09/2022 1:43 PM  For on call review [email or web address].

## 2022-08-09 NOTE — Progress Notes (Signed)
Inpatient Rehab Admissions Coordinator:   Met with pt at bedside.  Reviewed insurance and pt states he has not updated address yet.  We discussed risks of opening request/getting auth with [place] [employer] if it then switches to [facility] including lack of coverage for CIR stay.  He states he will call and I will f/u with him tomorrow mid day to confirm.    Shann Medal, PT, DPT Admissions Coordinator [phone] 08/09/22  2:08 PM

## 2022-08-10 ENCOUNTER — Ambulatory Visit
Admission: RE | Admit: 2022-08-10 | Discharge: 2022-08-10 | Disposition: A | Discharge status: Home / Self Care | Payer: BLUE CROSS/BLUE SHIELD | Source: Ambulatory Visit | Attending: Radiation Oncology | Admitting: Radiation Oncology

## 2022-08-10 ENCOUNTER — Ambulatory Visit
Admit: 2022-08-10 | Discharge: 2022-08-10 | Disposition: A | Discharge status: Home / Self Care | Payer: BLUE CROSS/BLUE SHIELD | Attending: Radiation Oncology | Admitting: Radiation Oncology

## 2022-08-10 DIAGNOSIS — C3491 Malignant neoplasm of unspecified part of right bronchus or lung: Secondary | ICD-10-CM

## 2022-08-10 DIAGNOSIS — C3490 Malignant neoplasm of unspecified part of unspecified bronchus or lung: Secondary | ICD-10-CM | POA: Insufficient documentation

## 2022-08-10 DIAGNOSIS — C7951 Secondary malignant neoplasm of bone: Secondary | ICD-10-CM

## 2022-08-10 DIAGNOSIS — C7931 Secondary malignant neoplasm of brain: Secondary | ICD-10-CM

## 2022-08-10 DIAGNOSIS — Z510 Encounter for antineoplastic radiation therapy: Secondary | ICD-10-CM | POA: Insufficient documentation

## 2022-08-10 DIAGNOSIS — E0800 Diabetes mellitus due to underlying condition with hyperosmolarity without nonketotic hyperglycemic-hyperosmolar coma (NKHHC): Secondary | ICD-10-CM

## 2022-08-10 LAB — GLUCOSE, CAPILLARY
Glucose-Capillary: 141 mg/dL — ABNORMAL HIGH (ref 70–99)
Glucose-Capillary: 229 mg/dL — ABNORMAL HIGH (ref 70–99)
Glucose-Capillary: 94 mg/dL (ref 70–99)
Glucose-Capillary: 330 mg/dL — ABNORMAL HIGH (ref 70–99)

## 2022-08-10 MED ORDER — INSULIN ASPART 100 UNIT/ML IJ SOLN
5.0000 [IU] | Freq: Three times a day (TID) | INTRAMUSCULAR | Status: DC
  Administered 2022-08-10: 5 [IU] via SUBCUTANEOUS

## 2022-08-10 NOTE — Progress Notes (Signed)
[facility] PROGRESS NOTE    Progress Note  CULLY LUCKOW [name]  YME:[id number] DOB: [birth year] DOA: 07/28/2022 PCP: Martinique, [name], MD     Brief Narrative:   Samuel [name] is an 56 y.o. male past medical history significant for diabetes mellitus on insulin, recurrent pancreatitis and alcohol abuse depression and diabetic neuropathy who is in the process of undergoing malignancy work-up found to have back pain about 4 months ago, has been receiving fragmented and incomplete care due to social and financial circumstances was found to have metastatic disease to the spine during revealed metastatic L2 deposit which has progressed to a compression fracture, with spinal canal stenosis and lower extremity weakness.  The patient underwent CT-guided biopsy of L2 followed by radiofrequency ablation and balloon kyphoplasty with clinical improvement.  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  PET scan on 08/04/2022, plan for inpatient rehab patient is medically stable    Assessment/Plan:   Metastatic cancer to spine [facility]) Status post L2 biopsy that showed metastatic adenocarcinoma of the lung. He status post radiofrequency ablation and balloon kyphoplasty with improved pain control and mobility. DLCO brace has been placed. Steroids as needed. Continue analgesics with MS Contin and immediate release morphine for breakthrough pain. Possible radiation therapy as an outpatient. Plan is for inpatient rehab.  Newly diagnosed metastatic lung cancer: To bone and brain, PET scan done was positive for 2 hypermetabolic right pulmonary nodules and adenopathy. MRI of the brain showed punctuated lesion in the right cerebellum and left temporal lobe consistent with metastatic disease. Case discussed with oncology for  chemo and their follow-up as an outpatient. Radiation oncology was consulted and recommended radiation as of outpatient for brain and spine no emergent indication at this point  in time.  Abnormal appearing left lung PET scan with groundglass opacities that are hypermetabolic: He remains asymptomatic with no hypoxia has remained afebrile.  Uncontrolled diabetes mellitus type 2 with hyperglycemia: With an A1c of 13: Continue long-acting insulin plus sliding scale blood glucose fairly controlled.  Hypomagnesium /hypokalemia: Repleted now resolved.  Depression and anxiety: Continue Lexapro and trazodone.  Hyperlipidemia: Continue Lipitor.  Normocytic anemia: Follow-up with PCP as an outpatient.  Severe protein caloric malnutrition in the context of chronic illness: Continue regular diet Ensure regularly.    DVT prophylaxis: lovenox Family Communication:noen Status is: Inpatient Remains inpatient appropriate because: Awaiting inpatient rehab admission.    Code Status:     Code Status Orders  (From admission, onward)           Start     Ordered   07/29/22 0413  Full code  Continuous        07/29/22 0412           Code Status History     Date Active Date Inactive Code Status Order ID Comments User Context   03/10/2021 2249 03/15/2021 2254 Full Code [id number]  Lewis Shock, [place] Inpatient   03/09/2021 2256 03/10/2021 2220 Full Code [id number]  Rozetta Nunnery, NP ED   03/09/2021 1320 03/09/2021 2216 Full Code [id number]  Gareth Morgan, MD ED   03/20/2020 2110 03/21/2020 1957 Full Code [id number]  Gwynne Edinger, MD Inpatient   12/24/2016 0001 12/28/2016 2108 Full Code [id number]  Gonzella Lex, MD Inpatient   12/24/2016 0001 12/24/2016 0001 Full Code [id number]  Gonzella Lex, MD Inpatient   12/22/2016 2057 12/23/2016 2258 Full Code [id number]  Merlyn Lot, MD ED  IV Access:   Peripheral IV   Procedures and diagnostic studies:   No results found.   Medical Consultants:   None.   Subjective:    [place] L Est[place]rooks [name] no complaints today.  Objective:    Vitals:   08/09/22 2011 08/10/22 0019 08/10/22 0354 08/10/22  0828  BP: 115/76 118/75 96/68 107/73  Pulse: 90 86 86 98  Resp: 17 17 17 18   Temp: 97.9 F (36.6 C) 98.4 F (36.9 C) 97.8 F (36.6 C) 98 F (36.7 C)  TempSrc: Oral Oral Oral Oral  SpO2: 98% 96% 98% 99%  Weight:      Height:       SpO2: 99 % O2 Flow Rate (L/min): 2 L/min   Intake/Output Summary (Last 24 hours) at 08/10/2022 0925 Last data filed at 08/10/2022 0355 Gross per 24 hour  Intake 240 ml  Output 875 ml  Net -635 ml   Filed Weights   08/05/22 0433 08/06/22 0427 08/09/22 1600  Weight: 48.3 kg 27.2 kg 58.7 kg    Exam: General exam: In no acute distress.  Brace in place. Respiratory system: Good air movement and clear to auscultation. Cardiovascular system: S1 & S2 heard, RRR. No JVD. Gastrointestinal system: Abdomen is nondistended, soft and nontender.  Extremities: No pedal edema. Skin: No rashes, lesions or ulcers Psychiatry: Judgement and insight appear normal. Mood & affect appropriate.    Data Reviewed:    Labs: Basic Metabolic Panel: No results for input(s): "NA", "K", "CL", "CO2", "GLUCOSE", "BUN", "CREATININE", "CALCIUM", "MG", "PHOS" in the last 168 hours. GFR Estimated Creatinine Clearance: 83.6 mL/min (A) (by C-G formula based on SCr of 0.4 mg/dL (L)). Liver Function Tests: No results for input(s): "AST", "ALT", "ALKPHOS", "BILITOT", "PROT", "ALBUMIN" in the last 168 hours. No results for input(s): "LIPASE", "AMYLASE" in the last 168 hours. No results for input(s): "AMMONIA" in the last 168 hours. Coagulation profile No results for input(s): "INR", "PROTIME" in the last 168 hours. COVID-19 Labs  No results for input(s): "DDIMER", "FERRITIN", "LDH", "CRP" in the last 72 hours.  Lab Results  Component Value Date   SARSCOV2NAA NEGATIVE 03/09/2021   [place] NEGATIVE 03/20/2020    CBC: No results for input(s): "WBC", "NEUTROABS", "HGB", "HCT", "MCV", "PLT" in the last 168 hours. Cardiac Enzymes: No results for input(s): "CKTOTAL", "CKMB",  "CKMBINDEX", "TROPONINI" in the last 168 hours. BNP (last 3 results) No results for input(s): "PROBNP" in the last 8760 hours. CBG: Recent Labs  Lab 08/09/22 0628 08/09/22 1139 08/09/22 1633 08/09/22 2119 08/10/22 0614  GLUCAP 243* 106* 144* 214* 141*   D-Dimer: No results for input(s): "DDIMER" in the last 72 hours. Hgb A1c: No results for input(s): "HGBA1C" in the last 72 hours. Lipid Profile: No results for input(s): "CHOL", "HDL", "LDLCALC", "TRIG", "CHOLHDL", "LDLDIRECT" in the last 72 hours. Thyroid function studies: No results for input(s): "TSH", "T4TOTAL", "T3FREE", "THYROIDAB" in the last 72 hours.  Invalid input(s): "FREET3" Anemia work up: No results for input(s): "VITAMINB12", "FOLATE", "FERRITIN", "TIBC", "IRON", "RETICCTPCT" in the last 72 hours. Sepsis Labs: No results for input(s): "PROCALCITON", "WBC", "LATICACIDVEN" in the last 168 hours. Microbiology No results found for this or any previous visit (from the past 240 hour(s)).   Medications:    atorvastatin  40 mg Oral Daily   docusate sodium  100 mg Oral BID   enoxaparin (LOVENOX) injection  30 mg Subcutaneous Daily   escitalopram  20 mg Oral Daily   feeding supplement  237 mL Oral TID BM  gabapentin  300 mg Oral TID   insulin aspart  0-15 Units Subcutaneous TID WC   insulin aspart  0-5 Units Subcutaneous QHS   insulin aspart  3 Units Subcutaneous TID WC   insulin glargine-yfgn  40 Units Subcutaneous Daily   lidocaine  1 patch Transdermal Q24H   morphine  15 mg Oral Q12H   multivitamin with minerals  1 tablet Oral Daily   polyethylene glycol  17 g Oral QHS   traZODone  50 mg Oral QHS   Continuous Infusions:    LOS: 12 days   Charlynne Cousins  [facility]  08/10/2022, 9:25 AM

## 2022-08-10 NOTE — Progress Notes (Signed)
  Radiation Oncology         [phone] ________________________________  Name: Samuel [name] MRN: [id number]  Date: 08/10/2022  DOB: 01-31-1966  SIMULATION AND TREATMENT PLANNING NOTE    ICD-10-CM   1. Metastatic cancer to brain [facility])  C79.31     2. Primary adenocarcinoma of right lung [facility])  C34.91       DIAGNOSIS:  56 yo man with 3 subcentimeter brain metastases from adenocarcinoma of the right upper lobe of the lung  NARRATIVE:  The patient was brought to the [place].  Identity was confirmed.  All relevant records and images related to the planned course of therapy were reviewed.  The patient freely provided informed written consent to proceed with treatment after reviewing the details related to the planned course of therapy. The consent form was witnessed and verified by the simulation staff. Intravenous access was established for contrast administration. Then, the patient was set-up in a stable reproducible supine position for radiation therapy.  A relocatable thermoplastic stereotactic head frame was fabricated for precise immobilization.  CT images were obtained.  Surface markings were placed.  The CT images were loaded into the planning software and fused with the patient's targeting MRI scan.  Then the target and avoidance structures were contoured.  Treatment planning then occurred.  The radiation prescription was entered and confirmed.  I have requested 3D planning  I have requested a DVH of the following structures: Brain stem, brain, left eye, right eye, lenses, optic chiasm, target volumes, uninvolved brain, and normal tissue.    SPECIAL TREATMENT PROCEDURE:  The planned course of therapy using radiation constitutes a special treatment procedure. Special care is required in the management of this patient for the following reasons. This treatment constitutes a Special Treatment Procedure for the following reason: High dose per fraction requiring special  monitoring for increased toxicities of treatment including daily imaging.  The special nature of the planned course of radiotherapy will require increased physician supervision and oversight to ensure patient's safety with optimal treatment outcomes.  This requires extended time and effort.  PLAN:  The patient will receive 20 Gy in 1 fraction to each target using a single isocenter technique.  ________________________________  Sheral Apley Tammi Klippel, M.D.

## 2022-08-10 NOTE — Progress Notes (Signed)
Physical Therapy Treatment Patient Details Name: Samuel [name] MRN: [id number] DOB: 27-Feb-1966 Today's Date: 08/10/2022   History of Present Illness 56 yo male with onset of intractible back pain with spine mets (unsure of primary per hx) was admitted on 9/7, now has plan for repletion of electrolytes, pain management and has pending plan for biopsy of lumbar spine and potential kyphoplasty.  Pain is in R hip and leg, low back and upper back at times.  Imaging has found mult thoracic spine spots that may be mets. 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 PMHx:  DM, pancreatitis, smoker, R rib and clavicle fractures,    PT Comments    Pt continues to be very motivated despite bilat LE pain. Pt with c/o initially of bilat thigh numbness which improved with ambulation. Pain remained at 7/10 t/o bilat LEs L>R. Continue to focus on standing and ambulation with minimal bilat UE dependence. Pt continues to improve ambulation tolerance of less bilat UE dependency. Acute PT to cont to follow.    Recommendations for follow up therapy are one component of a multi-disciplinary discharge planning process, led by the attending physician.  Recommendations may be updated based on patient status, additional functional criteria and insurance authorization.  Follow Up Recommendations  Acute inpatient rehab (3hours/day)     Assistance Recommended at Discharge Frequent or constant Supervision/Assistance  Patient can return home with the following Assistance with cooking/housework;Two people to help with bathing/dressing/bathroom;Assist for transportation   Equipment Recommendations  Rolling walker (2 wheels)    Recommendations for Other Services       Precautions / Restrictions Precautions Precautions: Fall;Back Precaution Booklet Issued: No Precaution Comments: reviewed precautions, no recall Required Braces or Orthoses: Spinal Brace Spinal Brace: Thoracolumbosacral orthotic;Applied in sitting  position Restrictions Weight Bearing Restrictions: No     Mobility  Bed Mobility Overal bed mobility: Needs Assistance Bed Mobility: Rolling, Sidelying to Sit Rolling: Supervision Sidelying to sit: Supervision       General bed mobility comments: log roll technique, supervision, use of bed rail    Transfers Overall transfer level: Needs assistance Equipment used: Rolling walker (2 wheels) Transfers: Sit to/from Stand Sit to Stand: Min guard           General transfer comment: min guard for safety, pt places L UE on arm rest of RW and pushes off bed with R UE    Ambulation/Gait Ambulation/Gait assistance: Min guard Gait Distance (Feet): 75 Feet (x2) Assistive device: Rolling walker (2 wheels) Gait Pattern/deviations: Step-through pattern, Decreased stride length Gait velocity: dec Gait velocity interpretation: <1.31 ft/sec, indicative of household ambulator   General Gait Details: focused on depending less on UEs and loosening grip on walker. Pt was able to progressively increased distance ambulated without bilat elbows locked in extension and less grip on RW, pt with onset of worsening L thigh pain with increasd amb distance   Stairs             Wheelchair Mobility    Modified Rankin (Stroke Patients Only)       Balance Overall balance assessment: Needs assistance Sitting-balance support: No upper extremity supported, Feet supported Sitting balance-Leahy Scale: Good Sitting balance - Comments: pt able to don brace on EOB without LOB   Standing balance support: Single extremity supported, During functional activity Standing balance-Leahy Scale: Fair Standing balance comment: pt requiring L UE support on walker while performing hygiene s/p BM, alternated unilateral support while pulling up underwear and shorts, pt did stand about 15 seconds x2 to  wash hands without UE support with min/modA from PT at hips to provide stability.                             Cognition Arousal/Alertness: Awake/alert Behavior During Therapy: WFL for tasks assessed/performed Overall Cognitive Status: Within Functional Limits for tasks assessed                                 General Comments: pt with improved comfort of being less dependent on UEs in standing and amb        Exercises      General Comments General comments (skin integrity, edema, etc.): VSS      Pertinent Vitals/Pain Pain Assessment Pain Assessment: 0-10 Pain Score: 8  Pain Descriptors / Indicators: Aching, Discomfort, Grimacing (report of bilat thigh numbness) Pain Intervention(s): Limited activity within patient's tolerance    Home Living                          Prior Function            PT Goals (current goals can now be found in the care plan section) Acute Rehab PT Goals Patient Stated Goal: return to indep Progress towards PT goals: Progressing toward goals    Frequency    Min 5X/week      PT Plan Current plan remains appropriate    Co-evaluation              AM-PAC PT "6 Clicks" Mobility   Outcome Measure  Help needed turning from your back to your side while in a flat bed without using bedrails?: A Little Help needed moving from lying on your back to sitting on the side of a flat bed without using bedrails?: A Little Help needed moving to and from a bed to a chair (including a wheelchair)?: A Little Help needed standing up from a chair using your arms (e.g., wheelchair or bedside chair)?: A Little Help needed to walk in hospital room?: A Lot Help needed climbing 3-5 steps with a railing? : A Lot 6 Click Score: 16    End of Session Equipment Utilized During Treatment: Gait belt Activity Tolerance: Patient tolerated treatment well (despite 7/10 pain in bilat LEs) Patient left: in chair;with call bell/phone within reach;with nursing/sitter in room Nurse Communication: Mobility status PT Visit Diagnosis:  Unsteadiness on feet (R26.81);Pain;Difficulty in walking, not elsewhere classified (R26.2)     Time: 5400-8676 PT Time Calculation (min) (ACUTE ONLY): 33 min  Charges:  $Gait Training: 8-22 mins $Therapeutic Activity: 8-22 mins                     Kittie Plater, PT, DPT Acute Rehabilitation Services Secure chat preferred Office #: [phone]    Berline Lopes 08/10/2022, 8:28 AM

## 2022-08-10 NOTE — Progress Notes (Signed)
  Radiation Oncology         [phone] ________________________________  Name: Samuel [name] MRN: [id number]  Date: 08/10/2022  DOB: [birth year]  SIMULATION AND TREATMENT PLANNING NOTE    ICD-10-CM   1. Metastatic cancer to spine [facility])  C79.51       DIAGNOSIS:  56 y.o. patient with lumbar metastasis  NARRATIVE:  The patient was brought to the [facility].  Identity was confirmed.  All relevant records and images related to the planned course of therapy were reviewed.  The patient freely provided informed written consent to proceed with treatment after reviewing the details related to the planned course of therapy. The consent form was witnessed and verified by the simulation staff.  Then, the patient was set-up in a stable reproducible  supine position for radiation therapy.  CT images were obtained.  Surface markings were placed.  The CT images were loaded into the planning software.  Then the target and avoidance structures were contoured including kidneys.  Treatment planning then occurred.  The radiation prescription was entered and confirmed.  Then, I designed and supervised the construction of a total of 3 medically necessary complex treatment devices with VacLoc positioner and 2 MLCs to shield kidneys.  I have requested : 3D Simulation  I have requested a DVH of the following structures: Left Kidney, Right Kidney and target.  PLAN:  The patient will receive 30 Gy in 10 fractions.  ________________________________  Sheral Apley Tammi Klippel, M.D.

## 2022-08-10 NOTE — Progress Notes (Signed)
Mobility Specialist: Progress Note   08/10/22 1048  Mobility  Activity Ambulated with assistance in hallway  Level of Assistance Contact guard assist, steadying assist  Assistive Device Front wheel walker  Distance Ambulated (ft) 180 ft (90'x2)  Activity Response Tolerated well  $Mobility charge 1 Mobility   Pt received in the chair and agreeable to mobility. C/o mild LLE discomfort during session, stopping x1 for standing break encouraging pt to use RW less for support. Pt back to the chair after session with call bell and phone in reach.   [facility] Holle Sprick Mobility Specialist Mobility Specialist [address]: [phone]

## 2022-08-10 NOTE — Progress Notes (Addendum)
Inpatient Rehab Admissions Coordinator:   Working on Civil [employer] fast streamer.  Seems [employer] [place] will not be effective till 10/1 so will seek auth with [employer] [place].    Addendum: Pt's [employer] [place] policy has terminated as of today.  Went to discuss with pt to see if he has an [place] policy number yet, and to discuss calling [place] to have policy extended through 9/30 to ensure no gaps in coverage.  Pt out of room, apparently at [facility] for radiation.  Will also need to determine whether pt now undergoing radiation and what that schedule is to coordinate for possible rehab.    Shann Medal, PT, DPT Admissions Coordinator [phone] 08/10/22  9:54 AM

## 2022-08-11 ENCOUNTER — Inpatient Hospital Stay: Payer: Medicaid Other

## 2022-08-11 ENCOUNTER — Inpatient Hospital Stay: Payer: Medicaid Other | Admitting: Hematology and Oncology

## 2022-08-11 LAB — GLUCOSE, CAPILLARY
Glucose-Capillary: 263 mg/dL — ABNORMAL HIGH (ref 70–99)
Glucose-Capillary: 74 mg/dL (ref 70–99)
Glucose-Capillary: 247 mg/dL — ABNORMAL HIGH (ref 70–99)
Glucose-Capillary: 95 mg/dL (ref 70–99)

## 2022-08-11 MED ORDER — INSULIN ASPART 100 UNIT/ML IJ SOLN
6.0000 [IU] | Freq: Three times a day (TID) | INTRAMUSCULAR | Status: DC
  Administered 2022-08-12: 6 [IU] via SUBCUTANEOUS

## 2022-08-11 MED ORDER — INSULIN ASPART 100 UNIT/ML IJ SOLN
0.0000 [IU] | Freq: Three times a day (TID) | INTRAMUSCULAR | Status: DC
  Administered 2022-08-11: 7 [IU] via SUBCUTANEOUS
  Administered 2022-08-12 (×2): 3 [IU] via SUBCUTANEOUS
  Administered 2022-08-13: 7 [IU] via SUBCUTANEOUS
  Administered 2022-08-13: 4 [IU] via SUBCUTANEOUS
  Administered 2022-08-14: 15 [IU] via SUBCUTANEOUS
  Administered 2022-08-14: 4 [IU] via SUBCUTANEOUS
  Administered 2022-08-15: 3 [IU] via SUBCUTANEOUS
  Administered 2022-08-15: 7 [IU] via SUBCUTANEOUS
  Administered 2022-08-16: 15 [IU] via SUBCUTANEOUS
  Administered 2022-08-17: 4 [IU] via SUBCUTANEOUS

## 2022-08-11 MED ORDER — INSULIN GLARGINE-YFGN 100 UNIT/ML ~~LOC~~ SOLN
27.0000 [IU] | Freq: Two times a day (BID) | SUBCUTANEOUS | Status: DC
  Administered 2022-08-11 – 2022-08-12 (×4): 27 [IU] via SUBCUTANEOUS
  Filled 2022-08-11 (×6): qty 0.27

## 2022-08-11 MED ORDER — INSULIN ASPART 100 UNIT/ML IJ SOLN
0.0000 [IU] | Freq: Every day | INTRAMUSCULAR | Status: DC
  Administered 2022-08-12: 2 [IU] via SUBCUTANEOUS

## 2022-08-11 MED ORDER — INSULIN GLARGINE-YFGN 100 UNIT/ML ~~LOC~~ SOLN: 30.0000 [IU] | Freq: Two times a day (BID) | SUBCUTANEOUS | Status: DC

## 2022-08-11 NOTE — Progress Notes (Signed)
[facility] PROGRESS NOTE    Progress Note  Samuel [name]  KWI:[id number] DOB: 1965/12/12 DOA: 07/28/2022 PCP: Martinique, [name], MD     Brief Narrative:   Samuel [name] is an 56 y.o. male past medical history significant for diabetes mellitus on insulin, recurrent pancreatitis and alcohol abuse depression and diabetic neuropathy who is in the process of undergoing malignancy work-up found to have back pain about 4 months ago, has been receiving fragmented and incomplete care due to social and financial circumstances was found to have metastatic disease to the spine during revealed metastatic L2 deposit which has progressed to a compression fracture, with spinal canal stenosis and lower extremity weakness.  The patient underwent CT-guided biopsy of L2 followed by radiofrequency ablation and balloon kyphoplasty with clinical improvement.  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  PET scan on 08/04/2022, plan for inpatient rehab patient is medically stable   Assessment/Plan:   Metastatic cancer to spine [facility]) Status post L2 biopsy that showed metastatic adenocarcinoma of the lung. He status post radiofrequency ablation and balloon kyphoplasty with improved pain control and mobility. DLCO brace has been placed. Steroids as needed. Continue analgesics with MS Contin and immediate release morphine for breakthrough pain. His pain seems to be controlled. Radiation oncology on board, PET scan can be done as an inpatient scheduled for 10 radiation treatments daily M-F to L-spine and one Radiosurgery treatment to brain Mets. Awaiting insurance authorizations for acute inpatient rehab.  Newly diagnosed metastatic lung cancer: To bone and brain, PET scan done was positive for 2 hypermetabolic right pulmonary nodules and adenopathy. MRI of the brain showed punctuated lesion in the right cerebellum and left temporal lobe consistent with metastatic disease.  Scheduled for  radiation see above for further details. Case discussed with oncology for  chemo and their follow-up as an outpatient. Radiation oncology was consulted and recommended radiation for brain and spine.  Abnormal appearing left lung PET scan with groundglass opacities that are hypermetabolic: He remains asymptomatic with no hypoxia has remained afebrile.  Uncontrolled diabetes mellitus type 2 with hyperglycemia: With an A1c of 13: Continue long-acting insulin plus sliding scale blood glucose fairly controlled.  Hypomagnesium /hypokalemia: Repleted now resolved.  Depression and anxiety: Continue Lexapro and trazodone.  Hyperlipidemia: Continue Lipitor.  Normocytic anemia: Follow-up with PCP as an outpatient.  Severe protein caloric malnutrition in the context of chronic illness: Continue regular diet Ensure regularly.    DVT prophylaxis: lovenox Family Communication:noen Status is: Inpatient Remains inpatient appropriate because: Awaiting inpatient rehab admission.    Code Status:     Code Status Orders  (From admission, onward)           Start     Ordered   07/29/22 0413  Full code  Continuous        07/29/22 0412           Code Status History     Date Active Date Inactive Code Status Order ID Comments User Context   03/10/2021 2249 03/15/2021 2254 Full Code [id number]  Lewis Shock, [place] Inpatient   03/09/2021 2256 03/10/2021 2220 Full Code [id number]  Rozetta Nunnery, NP ED   03/09/2021 1320 03/09/2021 2216 Full Code [id number]  Gareth Morgan, MD ED   03/20/2020 2110 03/21/2020 1957 Full Code [id number]  Gwynne Edinger, MD Inpatient   12/24/2016 0001 12/28/2016 2108 Full Code [id number]  Gonzella Lex, MD Inpatient   12/24/2016 0001 12/24/2016 0001 Full Code [id number]  Gonzella Lex, MD Inpatient   12/22/2016 2057 12/23/2016 2258 Full Code [id number]  Merlyn Lot, MD ED         IV Access:   Peripheral IV   Procedures and diagnostic studies:   No  results found.   Medical Consultants:   None.   Subjective:    Samuel [name] pain is controlled had a bowel movement.  Objective:    Vitals:   08/10/22 1935 08/10/22 2325 08/11/22 0401 08/11/22 0739  BP: 103/68 110/68 90/60 95/69   Pulse: 94 93 90 85  Resp: 20 18 16 18   Temp: (!) 97.5 F (36.4 C) 98.4 F (36.9 C) 97.8 F (36.6 C) 97.9 F (36.6 C)  TempSrc: Oral Oral Oral Oral  SpO2: 100% 99% 98% 98%  Weight:      Height:       SpO2: 98 % O2 Flow Rate (L/min): 2 L/min   Intake/Output Summary (Last 24 hours) at 08/11/2022 0927 Last data filed at 08/11/2022 0741 Gross per 24 hour  Intake 240 ml  Output 800 ml  Net -560 ml    Filed Weights   08/05/22 0433 08/06/22 0427 08/09/22 1600  Weight: 48.3 kg 27.2 kg 58.7 kg    Exam: General exam: In no acute distress. Respiratory system: Good air movement and clear to auscultation. Cardiovascular system: S1 & S2 heard, RRR. No JVD. Gastrointestinal system: Abdomen is nondistended, soft and nontender.  Extremities: No pedal edema. Skin: No rashes, lesions or ulcers Psychiatry: Judgement and insight appear normal. Mood & affect appropriate.  Data Reviewed:    Labs: Basic Metabolic Panel: No results for input(s): "NA", "K", "CL", "CO2", "GLUCOSE", "BUN", "CREATININE", "CALCIUM", "MG", "PHOS" in the last 168 hours. GFR Estimated Creatinine Clearance: 83.6 mL/min (A) (by C-G formula based on SCr of 0.4 mg/dL (L)). Liver Function Tests: No results for input(s): "AST", "ALT", "ALKPHOS", "BILITOT", "PROT", "ALBUMIN" in the last 168 hours. No results for input(s): "LIPASE", "AMYLASE" in the last 168 hours. No results for input(s): "AMMONIA" in the last 168 hours. Coagulation profile No results for input(s): "INR", "PROTIME" in the last 168 hours. COVID-19 Labs  No results for input(s): "DDIMER", "FERRITIN", "LDH", "CRP" in the last 72 hours.  Lab Results  Component Value Date   SARSCOV2NAA NEGATIVE 03/09/2021    [place] NEGATIVE 03/20/2020    CBC: No results for input(s): "WBC", "NEUTROABS", "HGB", "HCT", "MCV", "PLT" in the last 168 hours. Cardiac Enzymes: No results for input(s): "CKTOTAL", "CKMB", "CKMBINDEX", "TROPONINI" in the last 168 hours. BNP (last 3 results) No results for input(s): "PROBNP" in the last 8760 hours. CBG: Recent Labs  Lab 08/10/22 0614 08/10/22 1141 08/10/22 1643 08/10/22 2142 08/11/22 0640  GLUCAP 141* 229* 94 330* 263*    D-Dimer: No results for input(s): "DDIMER" in the last 72 hours. Hgb A1c: No results for input(s): "HGBA1C" in the last 72 hours. Lipid Profile: No results for input(s): "CHOL", "HDL", "LDLCALC", "TRIG", "CHOLHDL", "LDLDIRECT" in the last 72 hours. Thyroid function studies: No results for input(s): "TSH", "T4TOTAL", "T3FREE", "THYROIDAB" in the last 72 hours.  Invalid input(s): "FREET3" Anemia work up: No results for input(s): "VITAMINB12", "FOLATE", "FERRITIN", "TIBC", "IRON", "RETICCTPCT" in the last 72 hours. Sepsis Labs: No results for input(s): "PROCALCITON", "WBC", "LATICACIDVEN" in the last 168 hours. Microbiology No results found for this or any previous visit (from the past 240 hour(s)).   Medications:    atorvastatin  40 mg Oral Daily   docusate sodium  100 mg Oral BID  enoxaparin (LOVENOX) injection  30 mg Subcutaneous Daily   escitalopram  20 mg Oral Daily   feeding supplement  237 mL Oral TID BM   gabapentin  300 mg Oral TID   insulin aspart  0-15 Units Subcutaneous TID WC   insulin aspart  0-5 Units Subcutaneous QHS   insulin aspart  5 Units Subcutaneous TID WC   insulin glargine-yfgn  40 Units Subcutaneous Daily   lidocaine  1 patch Transdermal Q24H   morphine  15 mg Oral Q12H   multivitamin with minerals  1 tablet Oral Daily   polyethylene glycol  17 g Oral QHS   traZODone  50 mg Oral QHS   Continuous Infusions:    LOS: 13 days   Charlynne Cousins  [facility]  08/11/2022, 9:27 AM

## 2022-08-11 NOTE — Progress Notes (Signed)
Mobility Specialist: Progress Note   08/11/22 1102  Mobility  Activity Ambulated with assistance in hallway  Level of Assistance Contact guard assist, steadying assist  Assistive Device Front wheel walker  Distance Ambulated (ft) 120 ft  Activity Response Tolerated fair  $Mobility charge 1 Mobility   Post-Mobility: 91 HR, 117/68 (82) BP, 100% SpO2  Pt received in the bed and agreeable to mobility. C/o 6/10 LLE pain, mild dizziness, and nausea during ambulation. After returning to the bed, pain and nausea improved. VSS as seen above. Pt has call bell and phone at his side.   [facility] Boysie Bonebrake Mobility Specialist Mobility Specialist [address]: [phone]

## 2022-08-11 NOTE — Progress Notes (Signed)
OT Cancellation Note  Patient Details Name: Samuel [name] MRN: [id number] DOB: Dec 23, 1965   Cancelled Treatment:    Reason Eval/Treat Not Completed: Other (comment), pt fatigued and with lower blood sugar than normal, pt requesting to rest at this time.   Jolaine Artist, OT Acute Rehabilitation Services Office [place] 08/11/2022, 12:00 PM

## 2022-08-11 NOTE — Progress Notes (Signed)
PT Cancellation Note  Patient Details Name: Samuel [name] MRN: [id number] DOB: 1966-07-17   Cancelled Treatment:    Reason Eval/Treat Not Completed: Patient declined, no reason specified; reports having a rough day stating had low blood sugar earlier and just not feeling well.  Encouraged, but pt continued to request PT return tomorrow.  Will continue attempts.    Reginia Naas 08/11/2022, 6:29 PM Magda Kiel, PT Acute Rehabilitation Services [phone] 08/11/2022

## 2022-08-12 ENCOUNTER — Ambulatory Visit: Payer: BLUE CROSS/BLUE SHIELD | Admitting: Radiation Oncology

## 2022-08-12 DIAGNOSIS — E860 Dehydration: Secondary | ICD-10-CM

## 2022-08-12 DIAGNOSIS — R627 Adult failure to thrive: Secondary | ICD-10-CM

## 2022-08-12 LAB — GLUCOSE, CAPILLARY
Glucose-Capillary: 168 mg/dL — ABNORMAL HIGH (ref 70–99)
Glucose-Capillary: 140 mg/dL — ABNORMAL HIGH (ref 70–99)
Glucose-Capillary: 126 mg/dL — ABNORMAL HIGH (ref 70–99)
Glucose-Capillary: 54 mg/dL — ABNORMAL LOW (ref 70–99)
Glucose-Capillary: 75 mg/dL (ref 70–99)
Glucose-Capillary: 236 mg/dL — ABNORMAL HIGH (ref 70–99)

## 2022-08-12 NOTE — Progress Notes (Signed)
Physical Therapy Treatment Patient Details Name: Samuel [name] MRN: [id number] DOB: 10-Oct-1966 Today's Date: 08/12/2022   History of Present Illness 56 yo male with onset of intractible back pain with spine mets (unsure of primary per hx) was admitted on 9/7, now has plan for repletion of electrolytes, pain management and has pending plan for biopsy of lumbar spine and potential kyphoplasty.  Pain is in R hip and leg, low back and upper back at times.  Imaging has found mult thoracic spine spots that may be mets. 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 PMHx:  DM, pancreatitis, smoker, R rib and clavicle fractures,    PT Comments    Patient making steady progress with mobility. He is ambulating increased distance with RW, he continues to require walker for support to reduce pressure/pain on Lt side. EOS pt's pain subsided with seated rest and pain medication from RN. Seated exercises completed with no major increase in pain. He remains highly motivated to regain maximum functional independence and will benefit from continued skilled PT interventions and rehab follow up at AIR setting to increase safety and reduce fall risk prior to return home.   Recommendations for follow up therapy are one component of a multi-disciplinary discharge planning process, led by the attending physician.  Recommendations may be updated based on patient status, additional functional criteria and insurance authorization.  Follow Up Recommendations  Acute inpatient rehab (3hours/day)     Assistance Recommended at Discharge Frequent or constant Supervision/Assistance  Patient can return home with the following Assistance with cooking/housework;Two people to help with bathing/dressing/bathroom;Assist for transportation   Equipment Recommendations  Rolling walker (2 wheels)    Recommendations for Other Services       Precautions / Restrictions Precautions Precautions: Fall;Back Precaution Booklet Issued: No Precaution  Comments: reviewed precautions Required Braces or Orthoses: Spinal Brace Spinal Brace: Thoracolumbosacral orthotic;Applied in sitting position Restrictions Weight Bearing Restrictions: No     Mobility  Bed Mobility Overal bed mobility: Needs Assistance Bed Mobility: Rolling, Sidelying to Sit Rolling: Modified independent (Device/Increase time) Sidelying to sit: Modified independent (Device/Increase time)       General bed mobility comments: log roll technique, extra time, no bed rail, pt pushing up from flat bed.    Transfers Overall transfer level: Needs assistance Equipment used: Rolling walker (2 wheels) Transfers: Sit to/from Stand Sit to Stand: Supervision           General transfer comment: supervision for safety, no cues needed for hand placement with RW.    Ambulation/Gait Ambulation/Gait assistance: Min guard Gait Distance (Feet): 300 Feet Assistive device: Rolling walker (2 wheels) Gait Pattern/deviations: Step-through pattern, Decreased stride length Gait velocity: dec     General Gait Details: guarding for safety, intermittent cues for safe proximity to RW. no LOB noted throughout. pt c/o Lt sided back/LE pain ~ halfway through ambulation. no buckling.   Stairs             Wheelchair Mobility    Modified Rankin (Stroke Patients Only)       Balance Overall balance assessment: Needs assistance Sitting-balance support: No upper extremity supported, Feet supported Sitting balance-Leahy Scale: Good Sitting balance - Comments: pt able to don brace on EOB without LOB   Standing balance support: Single extremity supported, During functional activity Standing balance-Leahy Scale: Fair                              Cognition Arousal/Alertness: Awake/alert Behavior During  Therapy: WFL for tasks assessed/performed Overall Cognitive Status: Within Functional Limits for tasks assessed                                  General Comments: pt with improved comfort of being less dependent on UEs in standing and amb        Exercises General Exercises - Lower Extremity Ankle Circles/Pumps: AROM, Both, 15 reps Long Arc Quad: AROM, Both, 10 reps Hip Flexion/Marching: AROM, Both, 10 reps    General Comments        Pertinent Vitals/Pain Pain Assessment Pain Assessment: 0-10 Pain Score: 8  Pain Location: back and leg Pain Descriptors / Indicators: Aching, Discomfort, Grimacing (report of bilat thigh numbness) Pain Intervention(s): Limited activity within patient's tolerance, Monitored during session, Repositioned    Home Living                          Prior Function            PT Goals (current goals can now be found in the care plan section) Acute Rehab PT Goals Patient Stated Goal: return to indep PT Goal Formulation: With patient Time For Goal Achievement: 08/13/22 Potential to Achieve Goals: Good Progress towards PT goals: Progressing toward goals    Frequency    Min 5X/week      PT Plan Current plan remains appropriate    Co-evaluation              AM-PAC PT "6 Clicks" Mobility   Outcome Measure  Help needed turning from your back to your side while in a flat bed without using bedrails?: A Little Help needed moving from lying on your back to sitting on the side of a flat bed without using bedrails?: A Little Help needed moving to and from a bed to a chair (including a wheelchair)?: A Little Help needed standing up from a chair using your arms (e.g., wheelchair or bedside chair)?: A Little Help needed to walk in hospital room?: A Lot Help needed climbing 3-5 steps with a railing? : A Lot 6 Click Score: 16    End of Session Equipment Utilized During Treatment: Gait belt Activity Tolerance: Patient tolerated treatment well (despite 7/10 pain in bilat LEs) Patient left: in chair;with call bell/phone within reach;with nursing/sitter in room Nurse  Communication: Mobility status PT Visit Diagnosis: Unsteadiness on feet (R26.81);Pain;Difficulty in walking, not elsewhere classified (R26.2)     Time: 2751-7001 PT Time Calculation (min) (ACUTE ONLY): 28 min  Charges:  $Gait Training: 8-22 mins $Therapeutic Exercise: 8-22 mins                     Verner Mould, DPT Acute Rehabilitation Services Office [phone] Pager [phone]  08/12/22 12:18 PM

## 2022-08-12 NOTE — Progress Notes (Signed)
[facility]  PROGRESS NOTE  Samuel [name] IWP:[id number] DOB: 1966-07-03 DOA: 07/28/2022 PCP: [place], [name], MD   Brief HPI:   20 regular-year-old male with past medical history of diabetes mellitus on insulin, recurrent pancreatitis, alcohol abuse, depression, diabetic neuropathy who was in process of undergoing malignancy work-up found to have back pain about 4 months ago, has been receiving fragmented and incomplete care due to social and financial circumstances, was found to have metastatic disease to the spine revealing metastatic L2 deposit which has progressed to compression fracture with spinal canal stenosis and lower extremity weakness.  Patient underwent CT-guided biopsy of L2 followed by radiofrequency ablation and balloon kyphoplasty with clinical improvement.  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  Plan for inpatient rehab.    Subjective   Patient seen and examined, pain is well controlled.   Assessment/Plan:    Metastatic cancer to spine -S/p L2 biopsy that showed metastatic adenocarcinoma of the lung -S/p radiofrequency ablation and balloon kyphoplasty with improved pain control and mobility -DLCO brace has been placed -Continue MS Contin and as needed analgesics -Radiation oncology on board, PET scan can be done as outpatient -Scheduled for 10 radiation treatments Monday through Friday to lumbar spine in 1 radiosurgery treatment to brain mets -Patient awaiting insurance authorization for acute inpatient rehab  Newly diagnosed metastatic lung cancer -With mets to brain and bone, PET scan done was positive for 2 hypermetabolic right pulmonary nodules and adenopathy -MRI brain showed punctated lesion in the right cerebellum and left temporal lobe consistent with metastatic disease -Scheduled for radiosurgery as above -Oncology to follow-up as outpatient -Radiation oncology consulted, recommended radiation for brain and spine  Abdominal  appearing left lung on PET scan with groundglass opacities that are hypermetabolic -Patient remains asymptomatic with no hypoxia  Diabetes mellitus type 2 -Hemoglobin A1c 13 -Continue sliding scale insulin with NovoLog -Semglee 27 units subcu daily  Hypomagnesemia/hypokalemia -Replete  Depression/anxiety -Continue Lexapro, trazodone  Hyperlipidemia -Continue Lipitor  Normocytic anemia -Follow-up PCP as outpatient  Severe protein calorie malnutrition -Continue Ensure    Medications     atorvastatin  40 mg Oral Daily   docusate sodium  100 mg Oral BID   enoxaparin (LOVENOX) injection  30 mg Subcutaneous Daily   escitalopram  20 mg Oral Daily   feeding supplement  237 mL Oral TID BM   gabapentin  300 mg Oral TID   insulin aspart  0-20 Units Subcutaneous TID WC   insulin aspart  0-5 Units Subcutaneous QHS   insulin aspart  6 Units Subcutaneous TID WC   insulin glargine-yfgn  27 Units Subcutaneous BID   lidocaine  1 patch Transdermal Q24H   morphine  15 mg Oral Q12H   multivitamin with minerals  1 tablet Oral Daily   polyethylene glycol  17 g Oral QHS   traZODone  50 mg Oral QHS     Data Reviewed:   CBG:  Recent Labs  Lab 08/11/22 1640 08/11/22 2057 08/12/22 0513 08/12/22 0812 08/12/22 1138  GLUCAP 247* 95 168* 140* 126*    SpO2: 99 % O2 Flow Rate (L/min): 2 L/min    Vitals:   08/12/22 0005 08/12/22 0515 08/12/22 0720 08/12/22 1137  BP: 110/75 92/64 99/65  111/74  Pulse: 81 86 87 96  Resp: 12 14 16 17   Temp: 98 F (36.7 C) 98.1 F (36.7 C) 97.9 F (36.6 C) 98.1 F (36.7 C)  TempSrc: Oral Oral Oral Oral  SpO2: 100% 97% 99% 99%  Weight:      Height:          Data Reviewed:  Basic Metabolic Panel: No results for input(s): "NA", "K", "CL", "CO2", "GLUCOSE", "BUN", "CREATININE", "CALCIUM", "MG", "PHOS" in the last 168 hours.  CBC: No results for input(s): "WBC", "NEUTROABS", "HGB", "HCT", "MCV", "PLT" in the last 168 hours.  LFT No  results for input(s): "AST", "ALT", "ALKPHOS", "BILITOT", "PROT", "ALBUMIN" in the last 168 hours.   Antibiotics: Anti-infectives (From admission, onward)    Start     Dose/Rate Route Frequency Ordered Stop   08/01/22 0900  vancomycin (VANCOCIN) IVPB 1000 mg/200 mL premix        1,000 mg 200 mL/hr over 60 Minutes Intravenous To Radiology 08/01/22 0803 08/01/22 1155        DVT prophylaxis: Lovenox  Code Status: Full code  Family Communication: No family at bedside   CONSULTS oncology   Objective    Physical Examination:   General-appears in no acute distress Heart-S1-S2, regular, no murmur auscultated Lungs-clear to auscultation bilaterally, no wheezing or crackles auscultated Abdomen-soft, nontender, no organomegaly Extremities-no edema in the lower extremities Neuro-alert, oriented x3, no focal deficit noted  Status is: Inpatient:             Oswald Hillock   [facility] If 7PM-7AM, please contact night-coverage at [email or web address], Office  [phone]   08/12/2022, 12:42 PM  LOS: 14 days

## 2022-08-12 NOTE — Progress Notes (Signed)
Inpatient Rehab Admissions Coordinator:  Met with pt at bedside x2.  He's working on [employer] to ensure active coverage so we can open insurance prior auth request.  Will continue to follow.   Shann Medal, PT, DPT Admissions Coordinator [phone] 08/12/22  3:16 PM

## 2022-08-12 NOTE — Progress Notes (Signed)
Occupational Therapy Treatment Patient Details Name: Samuel [name] MRN: [id number] DOB: 1966/11/07 Today's Date: 08/12/2022   History of present illness 56 yo male with onset of intractible back pain with spine mets (unsure of primary per hx) was admitted on 9/7, now has plan for repletion of electrolytes, pain management and has pending plan for biopsy of lumbar spine and potential kyphoplasty.  Pain is in R hip and leg, low back and upper back at times.  Imaging has found mult thoracic spine spots that may be mets. 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 PMHx:  DM, pancreatitis, smoker, R rib and clavicle fractures,   OT comments  Patient progressing well towards goals. Worked on standing balance at sink with 0-1 hand support during ADLs.  Several sitting rest breaks required but min guard during UB/LB bathing and dressing at sink. Min cueing to adhere to back precautions.  Will follow acutely.    Recommendations for follow up therapy are one component of a multi-disciplinary discharge planning process, led by the attending physician.  Recommendations may be updated based on patient status, additional functional criteria and insurance authorization.    Follow Up Recommendations  Acute inpatient rehab (3hours/day)    Assistance Recommended at Discharge Intermittent Supervision/Assistance  Patient can return home with the following  A little help with walking and/or transfers;A little help with bathing/dressing/bathroom;Assistance with cooking/housework;Assist for transportation;Help with stairs or ramp for entrance   Equipment Recommendations  BSC/3in1    Recommendations for Other Services Rehab consult    Precautions / Restrictions Precautions Precautions: Fall;Back Precaution Booklet Issued: No Precaution Comments: reviewed precautions Required Braces or Orthoses: Spinal Brace Spinal Brace: Thoracolumbosacral orthotic;Applied in sitting position Restrictions Weight Bearing Restrictions: No        Mobility Bed Mobility Overal bed mobility: Needs Assistance Bed Mobility: Rolling, Sidelying to Sit Rolling: Supervision Sidelying to sit: Supervision       General bed mobility comments: cueing to complete log roll technqiue    Transfers Overall transfer level: Needs assistance Equipment used: Rolling walker (2 wheels) Transfers: Sit to/from Stand Sit to Stand: Min guard           General transfer comment: for safety, cueing for hand placement     Balance Overall balance assessment: Needs assistance Sitting-balance support: No upper extremity supported, Feet supported Sitting balance-Leahy Scale: Good     Standing balance support: Bilateral upper extremity supported, No upper extremity supported, During functional activity Standing balance-Leahy Scale: Fair                             ADL either performed or assessed with clinical judgement   ADL Overall ADL's : Needs assistance/impaired     Grooming: Standing;Min guard;Wash/dry face   Upper Body Bathing: Min guard;Sitting;Standing   Lower Body Bathing: Min guard;Sit to/from stand   Upper Body Dressing : Set up;Sitting   Lower Body Dressing: Min guard;Sit to/from stand   Toilet Transfer: Min guard;Ambulation;Rolling walker (2 wheels)           Functional mobility during ADLs: Min guard;Rolling walker (2 wheels)      Extremity/Trunk Assessment              Vision       Perception     Praxis      Cognition Arousal/Alertness: Awake/alert Behavior During Therapy: WFL for tasks assessed/performed Overall Cognitive Status: Within Functional Limits for tasks assessed  Exercises      Shoulder Instructions       General Comments      Pertinent Vitals/ Pain       Pain Assessment Pain Assessment: 0-10 Pain Score: 7  Pain Location: back and leg Pain Descriptors / Indicators: Aching, Discomfort,  Grimacing Pain Intervention(s): Limited activity within patient's tolerance, Monitored during session, Repositioned  Home Living                                          Prior Functioning/Environment              Frequency  Min 2X/week        Progress Toward Goals  OT Goals(current goals can now be found in the care plan section)  Progress towards OT goals: Progressing toward goals  Acute Rehab OT Goals Patient Stated Goal: less pain OT Goal Formulation: With patient Time For Goal Achievement: 08/17/22 Potential to Achieve Goals: Good  Plan Discharge plan remains appropriate;Frequency remains appropriate    Co-evaluation                 AM-PAC OT "6 Clicks" Daily Activity     Outcome Measure   Help from another person eating meals?: None Help from another person taking care of personal grooming?: A Little Help from another person toileting, which includes using toliet, bedpan, or urinal?: A Little Help from another person bathing (including washing, rinsing, drying)?: A Little Help from another person to put on and taking off regular upper body clothing?: A Little Help from another person to put on and taking off regular lower body clothing?: A Little 6 Click Score: 19    End of Session Equipment Utilized During Treatment: Rolling walker (2 wheels);Back brace  OT Visit Diagnosis: Other abnormalities of gait and mobility (R26.89);Muscle weakness (generalized) (M62.81);Pain Pain - Right/Left: Left Pain - part of body: Leg   Activity Tolerance Patient tolerated treatment well   Patient Left in chair;with call bell/phone within reach   Nurse Communication Mobility status;Patient requests pain meds        Time: 1102-1127 OT Time Calculation (min): 25 min  Charges: OT General Charges $OT Visit: 1 Visit OT Treatments $Self Care/Home Management : 23-37 mins  [place] Office  [phone]   Delight Stare 08/12/2022, 11:50 AM

## 2022-08-12 NOTE — Progress Notes (Signed)
Mobility Specialist: Progress Note   08/12/22 1555  Mobility  Activity Ambulated with assistance in hallway  Level of Assistance Contact guard assist, steadying assist  Assistive Device Front wheel walker  Distance Ambulated (ft) 350 ft  Activity Response Tolerated well  $Mobility charge 1 Mobility   Pt received in the bed and agreeable to mobility. C/o 8-9/10 LLE pain during ambulation, otherwise asymptomatic. Pt back to bed after session with call bell and phone at his side. Pt requesting pain medication, RN notified.   [facility] Mobility Specialist Mobility Specialist [address]: [phone]

## 2022-08-13 LAB — GLUCOSE, CAPILLARY
Glucose-Capillary: 55 mg/dL — ABNORMAL LOW (ref 70–99)
Glucose-Capillary: 58 mg/dL — ABNORMAL LOW (ref 70–99)
Glucose-Capillary: 62 mg/dL — ABNORMAL LOW (ref 70–99)
Glucose-Capillary: 106 mg/dL — ABNORMAL HIGH (ref 70–99)
Glucose-Capillary: 57 mg/dL — ABNORMAL LOW (ref 70–99)
Glucose-Capillary: 185 mg/dL — ABNORMAL HIGH (ref 70–99)
Glucose-Capillary: 209 mg/dL — ABNORMAL HIGH (ref 70–99)
Glucose-Capillary: 281 mg/dL — ABNORMAL HIGH (ref 70–99)
Glucose-Capillary: 189 mg/dL — ABNORMAL HIGH (ref 70–99)

## 2022-08-13 LAB — COMPREHENSIVE METABOLIC PANEL
ALT: 20 U/L (ref 0–44)
AST: 16 U/L (ref 15–41)
Albumin: 2.6 g/dL — ABNORMAL LOW (ref 3.5–5.0)
Alkaline Phosphatase: 62 U/L (ref 38–126)
Anion gap: 10 (ref 5–15)
BUN: 15 mg/dL (ref 6–20)
CO2: 27 mmol/L (ref 22–32)
Calcium: 9.5 mg/dL (ref 8.9–10.3)
Chloride: 100 mmol/L (ref 98–111)
Creatinine, Ser: 0.51 mg/dL — ABNORMAL LOW (ref 0.61–1.24)
GFR, Estimated: >60 mL/min (ref >60)
Glucose, Bld: 180 mg/dL — ABNORMAL HIGH (ref 70–99)
Potassium: 4.3 mmol/L (ref 3.5–5.1)
Sodium: 137 mmol/L (ref 135–145)
Total Bilirubin: 0.2 mg/dL — ABNORMAL LOW (ref 0.3–1.2)
Total Protein: 6.9 g/dL (ref 6.5–8.1)

## 2022-08-13 LAB — CBC
HCT: 32.8 % — ABNORMAL LOW (ref 39.0–52.0)
Hemoglobin: 11.1 g/dL — ABNORMAL LOW (ref 13.0–17.0)
MCH: 31.8 pg (ref 26.0–34.0)
MCHC: 33.8 g/dL (ref 30.0–36.0)
MCV: 94 fL (ref 80.0–100.0)
Platelets: 325 10*3/uL (ref 150–400)
RBC: 3.49 MIL/uL — ABNORMAL LOW (ref 4.22–5.81)
RDW: 12 % (ref 11.5–15.5)
WBC: 9.4 10*3/uL (ref 4.0–10.5)
nRBC: 0 % (ref 0.0–0.2)

## 2022-08-13 MED ORDER — INSULIN GLARGINE-YFGN 100 UNIT/ML ~~LOC~~ SOLN
20.0000 [IU] | Freq: Two times a day (BID) | SUBCUTANEOUS | Status: DC
  Administered 2022-08-13 – 2022-08-16 (×7): 20 [IU] via SUBCUTANEOUS
  Filled 2022-08-13 (×9): qty 0.2

## 2022-08-13 MED ORDER — IPRATROPIUM-ALBUTEROL 0.5-2.5 (3) MG/3ML IN SOLN: 3.0000 mL | RESPIRATORY_TRACT | Status: DC | PRN

## 2022-08-13 MED ORDER — METOPROLOL TARTRATE 5 MG/5ML IV SOLN: 5.0000 mg | INTRAVENOUS | Status: DC | PRN

## 2022-08-13 MED ORDER — HYDRALAZINE HCL 20 MG/ML IJ SOLN: 10.0000 mg | INTRAMUSCULAR | Status: DC | PRN

## 2022-08-13 MED ORDER — ENOXAPARIN SODIUM 40 MG/0.4ML IJ SOSY
40.0000 mg | PREFILLED_SYRINGE | Freq: Every day | INTRAMUSCULAR | Status: DC
  Administered 2022-08-14 – 2022-08-17 (×4): 40 mg via SUBCUTANEOUS
  Filled 2022-08-13 (×4): qty 0.4

## 2022-08-13 MED ORDER — GUAIFENESIN 100 MG/5ML PO LIQD: 5.0000 mL | ORAL | Status: DC | PRN

## 2022-08-13 NOTE — Progress Notes (Signed)
Mobility Specialist Progress Note:   08/13/22 1623  Mobility  Activity Refused mobility   Pt refused mobility d/t already being up in the chair. Requests to be seen before lunch. Will f/u as able.    Samuel [name] Mobility Specialist-Acute Rehab Secure Chat only

## 2022-08-13 NOTE — Progress Notes (Signed)
Pt requested pain med, rating pain around 7 and stated he wanted quick relief, took Dilaudid in as scheduled to patient, pt changed and decided he wanted Ox IR, took the unopened Dilaudid to pyxis to return,>>>selected pt, selected med, pyxsis asked do you want to scan, selected unable to scan, waited for drawer to open, drawer did not open, it completed the process without writer returning the med to pyxsis. Med remain in writer's possession. Called CN to assist, med inventory count completed, count accurate, contacted pharmacy to review, pharmacy will review drawer, pharmacy stated is  noted the return documentation, however, med in my possession. Instructed by pharmacy to return the unopened med of Dilaudid to pharmacy, gave med to CN, to return to pharmacy. Pharmacy will review in detail what happened to the med drawer. Pt received  Ox IR 15mg  PO for pain. SRP, RN

## 2022-08-13 NOTE — Plan of Care (Signed)
  Problem: Education: Goal: Ability to describe self-care measures that may prevent or decrease complications (Diabetes Survival Skills Education) will improve Outcome: Progressing Goal: Individualized Educational Video(s) Outcome: Progressing   Problem: Coping: Goal: Ability to adjust to condition or change in health will improve Outcome: Progressing   

## 2022-08-13 NOTE — Plan of Care (Signed)

## 2022-08-13 NOTE — Progress Notes (Signed)
PROGRESS NOTE    Samuel [name]  MGN:[id number] DOB: 12/15/1965 DOA: 07/28/2022 PCP: Martinique, [name], MD   Brief Narrative:  37 regular-year-old male with past medical history of diabetes mellitus on insulin, recurrent pancreatitis, alcohol abuse, depression, diabetic neuropathy who was in process of undergoing malignancy work-up found to have back pain about 4 months ago, has been receiving fragmented and incomplete care due to social and financial circumstances, was found to have metastatic disease to the spine revealing metastatic L2 deposit which has progressed to compression fracture with spinal canal stenosis and lower extremity weakness.  Patient underwent CT-guided biopsy of L2 followed by radiofrequency ablation and balloon kyphoplasty with clinical improvement.  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  Plan for inpatient rehab.   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  MRI brain showed punctate lesion in the right cerebellum on the left temporal -DLCO brace has been placed -Continue MS Contin and as needed analgesics -Radiation oncology on board, PET scan can be done as outpatient -Scheduled for 10 radiation treatments Monday through Friday to lumbar spine in 1 radiosurgery treatment to brain mets.  Oncology to follow-up outpatient. -Patient awaiting insurance authorization for acute inpatient rehab    Abdominal appearing left lung on PET scan with groundglass opacities that are hypermetabolic -Patient  remains asymptomatic with no hypoxia   Diabetes mellitus type 2 with hypoglycemic episodes -Hemoglobin A1c 13.  Discontinue Premeal NovoLog -Continue sliding scale insulin with NovoLog -Reduce Semglee to 20 units twice daily   Hypomagnesemia/hypokalemia -Replete as necessary   Depression/anxiety -On Lexapro and trazodone   Hyperlipidemia -Statin   Normocytic anemia -Follow-up PCP as outpatient   Severe protein calorie malnutrition -Continue Ensure     DVT prophylaxis: Lovenox Code Status: Full code Family Communication:    Status is: Inpatient Remains inpatient appropriate because: Awaiting CIR placement and will get chemoradiation   Nutritional status    Signs/Symptoms: severe fat depletion, severe muscle depletion  Interventions: Refer to RD note for recommendations  Body mass index is 18.77 kg/m.         Subjective: Patient having hypoglycemic episodes overnight No complaints this morning doing well.  All questions answered.  Examination:  General exam: Appears calm and comfortable  Respiratory system: Clear to auscultation. Respiratory effort normal. Cardiovascular system: S1 & S2 heard, RRR. No JVD, murmurs, rubs, gallops or clicks. No pedal edema. Gastrointestinal system: Abdomen is nondistended, soft and nontender. No organomegaly or masses felt. Normal bowel sounds heard. Central nervous system: Alert and oriented. No focal neurological deficits. Extremities: Symmetric 5 x 5 power. Skin: No rashes, lesions or ulcers Psychiatry: Judgement and insight appear normal. Mood & affect appropriate.     Objective: Vitals:   08/12/22 2328 08/13/22 0416 08/13/22 0500 08/13/22 0714  BP: 102/64 101/70  94/63  Pulse: 88 88  85  Resp: 20 18  17   Temp: 98.2 F (36.8 C) 97.8 F (36.6 C)  97.6 F (36.4 C)  TempSrc: Oral Oral  Oral  SpO2: 100% 99%  100%  Weight:  56 kg   Height:        Intake/Output Summary (Last 24 hours) at 08/13/2022  0758 Last data filed at 08/13/2022 0300 Gross per 24 hour  Intake 720 ml  Output 1350 ml  Net -630 ml   Filed Weights   08/06/22 0427 08/09/22 1600 08/13/22 0500  Weight: 27.2 kg 58.7 kg 56 kg     Data Reviewed:   CBC: No results for input(s): "WBC", "NEUTROABS", "HGB", "HCT", "MCV", "PLT" in the last 168 hours. Basic Metabolic Panel: No results for input(s): "NA", "K", "CL", "CO2", "GLUCOSE", "BUN", "CREATININE", "CALCIUM", "MG", "PHOS" in the last 168 hours. GFR: Estimated Creatinine Clearance: 79.7 mL/min (A) (by C-G formula based on SCr of 0.4 mg/dL (L)). Liver Function Tests: No results for input(s): "AST", "ALT", "ALKPHOS", "BILITOT", "PROT", "ALBUMIN" in the last 168 hours. No results for input(s): "LIPASE", "AMYLASE" in the last 168 hours. No results for input(s): "AMMONIA" in the last 168 hours. Coagulation Profile: No results for input(s): "INR", "PROTIME" in the last 168 hours. Cardiac Enzymes: No results for input(s): "CKTOTAL", "CKMB", "CKMBINDEX", "TROPONINI" in the last 168 hours. BNP (last 3 results) No results for input(s): "PROBNP" in the last 8760 hours. HbA1C: No results for input(s): "HGBA1C" in the last 72 hours. CBG: Recent Labs  Lab 08/13/22 0614 08/13/22 0615 08/13/22 0629 08/13/22 0648 08/13/22 0702  GLUCAP 62* 58* 55* 57* 106*   Lipid Profile: No results for input(s): "CHOL", "HDL", "LDLCALC", "TRIG", "CHOLHDL", "LDLDIRECT" in the last 72 hours. Thyroid Function Tests: No results for input(s): "TSH", "T4TOTAL", "FREET4", "T3FREE", "THYROIDAB" in the last 72 hours. Anemia Panel: No results for input(s): "VITAMINB12", "FOLATE", "FERRITIN", "TIBC", "IRON", "RETICCTPCT" in the last 72 hours. Sepsis Labs: No results for input(s): "PROCALCITON", "LATICACIDVEN" in the last 168 hours.  No results found for this or any previous visit (from the past 240 hour(s)).       Radiology Studies: No results found.      Scheduled Meds:   atorvastatin  40 mg Oral Daily   docusate sodium  100 mg Oral BID   enoxaparin (LOVENOX) injection  30 mg Subcutaneous Daily   escitalopram  20 mg Oral Daily   feeding supplement  237 mL Oral TID BM   gabapentin  300 mg Oral TID   insulin aspart  0-20 Units Subcutaneous TID WC   insulin aspart  0-5 Units Subcutaneous QHS   insulin glargine-yfgn  20 Units Subcutaneous BID   lidocaine  1 patch Transdermal Q24H   morphine  15 mg Oral Q12H   multivitamin with minerals  1 tablet Oral Daily   polyethylene glycol  17 g Oral QHS   traZODone  50 mg Oral QHS   Continuous Infusions:   LOS: 15 days   Time spent= 35 mins    Taje Tondreau Arsenio Loader, MD [facility]  If 7PM-7AM, please contact night-coverage  08/13/2022, 7:58 AM

## 2022-08-13 NOTE — Progress Notes (Signed)
Hypoglycemic Event  CBG: 58  Treatment: apple juice, Ensure and 4oz Ice cream  Symptoms: None  Follow-up CBG: Time: 0630 CBG Result: 55  Possible Reasons for Event: Inadequate meal intake and Medication regimen:    Comments/MD notified:Checked blood sugar 55, gave Ice cream and Ensure, rechecked @ 0640 CBG: 57  0700 CBG 106     Pt asymptomatic during event.  Pt received Semglee 27 u and HS sliding scale Novolog 2 units  [place], [place] R

## 2022-08-13 NOTE — Progress Notes (Signed)
Physical Therapy Treatment Patient Details Name: Samuel [name] MRN: [id number] DOB: [birth year] Today's Date: 08/13/2022   History of Present Illness 56 yo male with onset of intractible back pain with spine mets (unsure of primary per hx) was admitted on 9/7, now has plan for repletion of electrolytes, pain management and has pending plan for biopsy of lumbar spine and potential kyphoplasty.  Pain is in R hip and leg, low back and upper back at times.  Imaging has found mult thoracic spine spots that may be mets. 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 PMHx:  DM, pancreatitis, smoker, R rib and clavicle fractures,    PT Comments    Pt tolerates treatment well, ambulating for household distances with support of walker. Pt is able to intermittently reduce support through upper extremities on walker, but unable to ambulate without the device at this time. Pt reports progressive increase in pain through LLE with further mobility. PT provides education on sit to stand training with staggered feet in an effort to provide strengthening of LLE. PT continues to recommend AIR at this time.   Recommendations for follow up therapy are one component of a multi-disciplinary discharge planning process, led by the attending physician.  Recommendations may be updated based on patient status, additional functional criteria and insurance authorization.  Follow Up Recommendations  Acute inpatient rehab (3hours/day) Can patient physically be transported by private vehicle: Yes   Assistance Recommended at Discharge Intermittent Supervision/Assistance  Patient can return home with the following A little help with bathing/dressing/bathroom;Assistance with cooking/housework;Assist for transportation   Equipment Recommendations  Rolling walker (2 wheels)    Recommendations for Other Services       Precautions / Restrictions Precautions Precautions: Fall;Back Precaution Booklet Issued: No Precaution Comments: reviewed  precautions Required Braces or Orthoses: Spinal Brace Spinal Brace: Thoracolumbosacral orthotic (brace on upon arrival) Restrictions Weight Bearing Restrictions: No     Mobility  Bed Mobility                    Transfers Overall transfer level: Needs assistance Equipment used: Rolling walker (2 wheels) Transfers: Sit to/from Stand Sit to Stand: Min guard                Ambulation/Gait Ambulation/Gait assistance: Min guard Gait Distance (Feet): 150 Feet (150' x 2, one seated rest break) Assistive device: Rolling walker (2 wheels) Gait Pattern/deviations: Step-through pattern Gait velocity: reduced Gait velocity interpretation: <1.8 ft/sec, indicate of risk for recurrent falls   General Gait Details: pt with slowed step-through gait   Stairs             Wheelchair Mobility    Modified Rankin (Stroke Patients Only)       Balance Overall balance assessment: Needs assistance Sitting-balance support: No upper extremity supported, Feet supported Sitting balance-Leahy Scale: Good     Standing balance support: Single extremity supported, Bilateral upper extremity supported, Reliant on assistive device for balance Standing balance-Leahy Scale: Poor                              Cognition Arousal/Alertness: Awake/alert Behavior During Therapy: WFL for tasks assessed/performed Overall Cognitive Status: Within Functional Limits for tasks assessed                                          Exercises Other Exercises Other  Exercises: sit to stands with RLE extended to allow for increased force production from LLE    General Comments General comments (skin integrity, edema, etc.): VSS on RA      Pertinent Vitals/Pain Pain Assessment Pain Assessment: Faces Faces Pain Scale: Hurts even more Pain Location: back and LLE Pain Descriptors / Indicators: Shooting Pain Intervention(s): Monitored during session    Home Living                           Prior Function            PT Goals (current goals can now be found in the care plan section) Acute Rehab PT Goals Patient Stated Goal: return to indep PT Goal Formulation: With patient Time For Goal Achievement: 08/27/22 Potential to Achieve Goals: Good Additional Goals Additional Goal #1: Pt will score >19/24 on the DGI to indicate a reduced risk for falls Progress towards PT goals: Progressing toward goals    Frequency    Min 5X/week      PT Plan Current plan remains appropriate    Co-evaluation              AM-PAC PT "6 Clicks" Mobility   Outcome Measure  Help needed turning from your back to your side while in a flat bed without using bedrails?: A Little Help needed moving from lying on your back to sitting on the side of a flat bed without using bedrails?: A Little Help needed moving to and from a bed to a chair (including a wheelchair)?: A Little Help needed standing up from a chair using your arms (e.g., wheelchair or bedside chair)?: A Little Help needed to walk in hospital room?: A Little Help needed climbing 3-5 steps with a railing? : Total 6 Click Score: 16    End of Session   Activity Tolerance: Patient tolerated treatment well Patient left: in chair;with call bell/phone within reach Nurse Communication: Mobility status PT Visit Diagnosis: Unsteadiness on feet (R26.81);Pain;Difficulty in walking, not elsewhere classified (R26.2) Pain - Right/Left: Left Pain - part of body: Hip;Knee;Leg     Time: 5974-1638 PT Time Calculation (min) (ACUTE ONLY): 11 min  Charges:  $Gait Training: 8-22 mins                     Zenaida Niece, PT, DPT Acute Rehabilitation Office [place] 08/13/2022, 1:03 PM

## 2022-08-14 LAB — GLUCOSE, CAPILLARY
Glucose-Capillary: 84 mg/dL (ref 70–99)
Glucose-Capillary: 342 mg/dL — ABNORMAL HIGH (ref 70–99)
Glucose-Capillary: 175 mg/dL — ABNORMAL HIGH (ref 70–99)
Glucose-Capillary: 112 mg/dL — ABNORMAL HIGH (ref 70–99)

## 2022-08-14 LAB — BASIC METABOLIC PANEL
Anion gap: 7 (ref 5–15)
BUN: 13 mg/dL (ref 6–20)
CO2: 29 mmol/L (ref 22–32)
Calcium: 9.5 mg/dL (ref 8.9–10.3)
Chloride: 103 mmol/L (ref 98–111)
Creatinine, Ser: 0.43 mg/dL — ABNORMAL LOW (ref 0.61–1.24)
GFR, Estimated: >60 mL/min (ref >60)
Glucose, Bld: 65 mg/dL — ABNORMAL LOW (ref 70–99)
Potassium: 4 mmol/L (ref 3.5–5.1)
Sodium: 139 mmol/L (ref 135–145)

## 2022-08-14 LAB — CBC
HCT: 33.8 % — ABNORMAL LOW (ref 39.0–52.0)
Hemoglobin: 11 g/dL — ABNORMAL LOW (ref 13.0–17.0)
MCH: 31.3 pg (ref 26.0–34.0)
MCHC: 32.5 g/dL (ref 30.0–36.0)
MCV: 96 fL (ref 80.0–100.0)
Platelets: 354 10*3/uL (ref 150–400)
RBC: 3.52 MIL/uL — ABNORMAL LOW (ref 4.22–5.81)
RDW: 12 % (ref 11.5–15.5)
WBC: 8.2 10*3/uL (ref 4.0–10.5)
nRBC: 0 % (ref 0.0–0.2)

## 2022-08-14 LAB — MAGNESIUM: Magnesium: 2 mg/dL (ref 1.7–2.4)

## 2022-08-14 NOTE — Progress Notes (Signed)
Spoke with [facility], [employer]. Tech in pharmacy, stated that the count validates accuracy and that the pyxis defaulted to return to pharmacy dept; for additional details reference pharmacy. Resolution complete. SRP, RN

## 2022-08-14 NOTE — Plan of Care (Signed)
  Problem: Education: Goal: Ability to describe self-care measures that may prevent or decrease complications (Diabetes Survival Skills Education) will improve Outcome: Progressing   Problem: Skin Integrity: Goal: Risk for impaired skin integrity will decrease Outcome: Progressing   Problem: Activity: Goal: Risk for activity intolerance will decrease Outcome: Progressing   Problem: Pain Managment: Goal: General experience of comfort will improve Outcome: Progressing   Problem: Safety: Goal: Ability to remain free from injury will improve Outcome: Progressing

## 2022-08-14 NOTE — Progress Notes (Signed)
Mobility Specialist Progress Note:   08/14/22 1209  Mobility  Activity Ambulated with assistance in hallway  Level of Assistance Contact guard assist, steadying assist  Assistive Device Front wheel walker  Distance Ambulated (ft) 350 ft  Activity Response Tolerated well  $Mobility charge 1 Mobility   Pt received in bed and agreeable. No complaints. Pt left in bed with all needs met and call bell in reach.   Tammera Engert Mobility Specialist-Acute Rehab Secure Chat only

## 2022-08-14 NOTE — Progress Notes (Signed)
PROGRESS NOTE    Samuel [name]  PFX:[id number] DOB: Aug 21, 1966 DOA: 07/28/2022 PCP: Martinique, [name], MD   Brief Narrative:  1 regular-year-old male with past medical history of diabetes mellitus on insulin, recurrent pancreatitis, alcohol abuse, depression, diabetic neuropathy who was in process of undergoing malignancy work-up found to have back pain about 4 months ago, has been receiving fragmented and incomplete care due to social and financial circumstances, was found to have metastatic disease to the spine revealing metastatic L2 deposit which has progressed to compression fracture with spinal canal stenosis and lower extremity weakness.  Patient underwent CT-guided biopsy of L2 followed by radiofrequency ablation and balloon kyphoplasty with clinical improvement.  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  Plan for inpatient rehab.   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  MRI brain showed punctate lesion in the right cerebellum on the left temporal -DLCO brace has been placed -Continue MS Contin and as needed analgesics -Radiation oncology on board, PET scan can be done as outpatient -Scheduled for 10 radiation treatments Monday through Friday to lumbar spine in 1 radiosurgery treatment to brain mets.  Oncology to follow-up outpatient. Pending insurance authorization for CIR    Abdominal appearing left lung on PET scan with groundglass opacities that are hypermetabolic -Patient remains asymptomatic with no  hypoxia   Diabetes mellitus type 2 with hypoglycemic episodes -Hemoglobin A1c 13.  Discontinue Premeal NovoLog -Continue sliding scale insulin with NovoLog -Reduce Semglee to 20 units twice daily   Hypomagnesemia/hypokalemia -Replete as necessary   Depression/anxiety -On Lexapro and trazodone   Hyperlipidemia -Statin   Normocytic anemia -Follow-up PCP as outpatient   Severe protein calorie malnutrition -Continue Ensure   PT/OT CIR  DVT prophylaxis: Lovenox Code Status: Full code Family Communication:    Status is: Inpatient Remains inpatient appropriate because: Awaiting CIR placement and will get chemoradiation   Nutritional status    Signs/Symptoms: severe fat depletion, severe muscle depletion  Interventions: Refer to RD note for recommendations  Body mass index is 18.77 kg/m.         Subjective: No acute events overnight.  No complaints this morning. Examination:  Constitutional: Not in acute distress.  Cachectic frail Respiratory: Clear to auscultation bilaterally Cardiovascular: Normal sinus rhythm, no rubs Abdomen: Nontender nondistended good bowel sounds Musculoskeletal: No edema noted Skin: No rashes seen Neurologic: CN 2-12 grossly intact.  And nonfocal Psychiatric: Normal judgment and insight. Alert and oriented x 3. Normal mood.  Objective: Vitals:   08/13/22 1927 08/13/22 2345 08/14/22 0337 08/14/22 0722  BP: 104/67 (!) 127/93 112/74 102/69  Pulse: 95 (!) 105 89 91  Resp: 16 17 17 16   Temp: 98.1 F (36.7 C) 98.7 F (37.1 C) 98.2 F (36.8 C) (!) 97.4 F (36.3 C)  TempSrc: Oral Oral Oral Oral  SpO2: 100% 100% 99% 100%  Weight:      Height:        Intake/Output Summary (Last 24 hours) at 08/14/2022 1027 Last data filed at 08/14/2022 0307 Gross per 24 hour  Intake --  Output 300 ml  Net -300 ml   Filed Weights   08/06/22 0427 08/09/22 1600 08/13/22 0500  Weight: 27.2 kg 58.7 kg 56 kg     Data Reviewed:   CBC: Recent  Labs  Lab 08/13/22 0904 08/14/22 0725  WBC 9.4 8.2  HGB 11.1* 11.0*  HCT 32.8* 33.8*  MCV 94.0 96.0  PLT 325 415   Basic Metabolic Panel: Recent Labs  Lab 08/13/22 0904 08/14/22 0725  NA 137 139  K 4.3 4.0  CL 100 103  CO2 27 29  GLUCOSE 180* 65*  BUN 15 13  CREATININE 0.51* 0.43*  CALCIUM 9.5 9.5  MG  --  2.0   GFR: Estimated Creatinine Clearance: 79.7 mL/min (A) (by C-G formula based on SCr of 0.43 mg/dL (L)). Liver Function Tests: Recent Labs  Lab 08/13/22 0904  AST 16  ALT 20  ALKPHOS 62  BILITOT 0.2*  PROT 6.9  ALBUMIN 2.6*   No results for input(s): "LIPASE", "AMYLASE" in the last 168 hours. No results for input(s): "AMMONIA" in the last 168 hours. Coagulation Profile: No results for input(s): "INR", "PROTIME" in the last 168 hours. Cardiac Enzymes: No results for input(s): "CKTOTAL", "CKMB", "CKMBINDEX", "TROPONINI" in the last 168 hours. BNP (last 3 results) No results for input(s): "PROBNP" in the last 8760 hours. HbA1C: No results for input(s): "HGBA1C" in the last 72 hours. CBG: Recent Labs  Lab 08/13/22 1127 08/13/22 1515 08/13/22 1640 08/13/22 2147 08/14/22 0627  GLUCAP 185* 209* 281* 189* 84   Lipid Profile: No results for input(s): "CHOL", "HDL", "LDLCALC", "TRIG", "CHOLHDL", "LDLDIRECT" in the last 72 hours. Thyroid Function Tests: No results for input(s): "TSH", "T4TOTAL", "FREET4", "T3FREE", "THYROIDAB" in the last 72 hours. Anemia Panel: No results for input(s): "VITAMINB12", "FOLATE", "FERRITIN", "TIBC", "IRON", "RETICCTPCT" in the last 72 hours. Sepsis Labs: No results for input(s): "PROCALCITON", "LATICACIDVEN" in the last 168 hours.  No results found for this or any previous visit (from the past 240 hour(s)).       Radiology Studies: No results found.      Scheduled Meds:  atorvastatin  40 mg Oral Daily   docusate sodium  100 mg Oral BID   enoxaparin (LOVENOX) injection  40 mg Subcutaneous Daily   escitalopram   20 mg Oral Daily   feeding supplement  237 mL Oral TID BM   gabapentin  300 mg Oral TID   insulin aspart  0-20 Units Subcutaneous TID WC   insulin aspart  0-5 Units Subcutaneous QHS   insulin glargine-yfgn  20 Units Subcutaneous BID   lidocaine  1 patch Transdermal Q24H   morphine  15 mg Oral Q12H   multivitamin with minerals  1 tablet Oral Daily   polyethylene glycol  17 g Oral QHS   traZODone  50 mg Oral QHS   Continuous Infusions:   LOS: 16 days   Time spent= 35 mins    Geraline Halberstadt Arsenio Loader, MD [facility]  If 7PM-7AM, please contact night-coverage  08/14/2022, 10:27 AM

## 2022-08-15 ENCOUNTER — Encounter (HOSPITAL_COMMUNITY): Payer: Self-pay

## 2022-08-15 LAB — CBC
HCT: 34 % — ABNORMAL LOW (ref 39.0–52.0)
Hemoglobin: 11.2 g/dL — ABNORMAL LOW (ref 13.0–17.0)
MCH: 31.5 pg (ref 26.0–34.0)
MCHC: 32.9 g/dL (ref 30.0–36.0)
MCV: 95.5 fL (ref 80.0–100.0)
Platelets: 367 10*3/uL (ref 150–400)
RBC: 3.56 MIL/uL — ABNORMAL LOW (ref 4.22–5.81)
RDW: 12.1 % (ref 11.5–15.5)
WBC: 10 10*3/uL (ref 4.0–10.5)
nRBC: 0 % (ref 0.0–0.2)

## 2022-08-15 LAB — GLUCOSE, CAPILLARY
Glucose-Capillary: 105 mg/dL — ABNORMAL HIGH (ref 70–99)
Glucose-Capillary: 111 mg/dL — ABNORMAL HIGH (ref 70–99)
Glucose-Capillary: 143 mg/dL — ABNORMAL HIGH (ref 70–99)
Glucose-Capillary: 164 mg/dL — ABNORMAL HIGH (ref 70–99)
Glucose-Capillary: 228 mg/dL — ABNORMAL HIGH (ref 70–99)

## 2022-08-15 LAB — BASIC METABOLIC PANEL
Anion gap: 6 (ref 5–15)
BUN: 15 mg/dL (ref 6–20)
CO2: 30 mmol/L (ref 22–32)
Calcium: 9.6 mg/dL (ref 8.9–10.3)
Chloride: 102 mmol/L (ref 98–111)
Creatinine, Ser: 0.53 mg/dL — ABNORMAL LOW (ref 0.61–1.24)
GFR, Estimated: >60 mL/min (ref >60)
Glucose, Bld: 146 mg/dL — ABNORMAL HIGH (ref 70–99)
Potassium: 4.5 mmol/L (ref 3.5–5.1)
Sodium: 138 mmol/L (ref 135–145)

## 2022-08-15 LAB — MAGNESIUM: Magnesium: 1.8 mg/dL (ref 1.7–2.4)

## 2022-08-15 NOTE — Progress Notes (Signed)
Inpatient Rehab Admissions Coordinator:    Met with patient at bedside. Per documentation he is noted to have consistently ambulated ~300 feet with RW and min guard. Patient reports he does not feel safe enough to return home with intermittent supervision at this time (sister and nephew). However, patient has had a lapse in insurance coverage with [place] insurance not starting until 08/21/22 per patient. Reviewed estimated cost of care with patient if he is to come as uninsured patient. Patient states he is unable to pay for care without insurance. Will contact TOC for further options for discharge.   Rehab Admissons Coordinator [place], [place], [place] [phone]

## 2022-08-15 NOTE — Progress Notes (Signed)
PROGRESS NOTE    Samuel [name]  KAJ:[id number] DOB: 12/05/1965 DOA: 07/28/2022 PCP: Martinique, [name], MD   Brief Narrative:  53 regular-year-old male with past medical history of diabetes mellitus on insulin, recurrent pancreatitis, alcohol abuse, depression, diabetic neuropathy who was in process of undergoing malignancy work-up found to have back pain about 4 months ago, has been receiving fragmented and incomplete care due to social and financial circumstances, was found to have metastatic disease to the spine revealing metastatic L2 deposit which has progressed to compression fracture with spinal canal stenosis and lower extremity weakness.  Patient underwent CT-guided biopsy of L2 followed by radiofrequency ablation and balloon kyphoplasty with clinical improvement.  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  Plan for inpatient rehab.   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  MRI brain showed punctate lesion in the right cerebellum on the left temporal -DLCO brace has been placed -Continue MS Contin and as needed analgesics -Radiation oncology on board, PET scan can be done as outpatient -10 radiation treatments to his lumbar spine as scheduled to start this Wednesday .  Oncology to follow-up outpatient. Continue current authorization for CIR    Abdominal appearing left lung on PET scan with groundglass opacities that are hypermetabolic -Patient remains asymptomatic with no hypoxia   Diabetes mellitus type 2  with hypoglycemic episodes -Hemoglobin A1c 13.  Discontinue Premeal NovoLog -Continue sliding scale insulin with NovoLog -Semglee to 20 units twice daily   Hypomagnesemia/hypokalemia -Replete as necessary   Depression/anxiety -On Lexapro and trazodone   Hyperlipidemia -Statin   Normocytic anemia -Follow-up PCP as outpatient   Severe protein calorie malnutrition -Continue Ensure   PT/OT CIR  DVT prophylaxis: Lovenox Code Status: Full code Family Communication:    Status is: Inpatient Remains inpatient appropriate because: Awaiting CIR placement and will get chemoradiation   Nutritional status    Signs/Symptoms: severe fat depletion, severe muscle depletion  Interventions: Refer to RD note for recommendations  Body mass index is 18.77 kg/m.         Subjective: No complaints doing well Examination: Constitutional: Not in acute distress.  Cachectic frail Respiratory: Clear to auscultation bilaterally Cardiovascular: Normal sinus rhythm, no rubs Abdomen: Nontender nondistended good bowel sounds Musculoskeletal: No edema noted Skin: No rashes seen Neurologic: CN 2-12 grossly intact.  And nonfocal Psychiatric: Normal judgment and insight. Alert and oriented x 3. Normal mood. Objective: Vitals:   08/14/22 1611 08/14/22 2023 08/15/22 0626 08/15/22 0859  BP: 95/75 105/72 104/63 113/77  Pulse: 95 95 98 94  Resp: 16 18 18 17   Temp: 98.4 F (36.9 C) 97.6 F (36.4 C) 98.2 F (36.8 C)   TempSrc: Oral Oral Oral   SpO2: 100% 100% 100% 100%  Weight:      Height:        Intake/Output Summary (Last 24 hours) at 08/15/2022 1120 Last data filed at 08/15/2022 0600 Gross per 24 hour  Intake 360 ml  Output 400 ml  Net -40 ml   Filed Weights   08/06/22 0427 08/09/22  1600 08/13/22 0500  Weight: 27.2 kg 58.7 kg 56 kg     Data Reviewed:   CBC: Recent Labs  Lab 08/13/22 0904 08/14/22 0725 08/15/22 0853  WBC 9.4 8.2 10.0  HGB 11.1* 11.0* 11.2*  HCT  32.8* 33.8* 34.0*  MCV 94.0 96.0 95.5  PLT 325 354 518   Basic Metabolic Panel: Recent Labs  Lab 08/13/22 0904 08/14/22 0725 08/15/22 0853  NA 137 139 138  K 4.3 4.0 4.5  CL 100 103 102  CO2 27 29 30   GLUCOSE 180* 65* 146*  BUN 15 13 15   CREATININE 0.51* 0.43* 0.53*  CALCIUM 9.5 9.5 9.6  MG  --  2.0 1.8   GFR: Estimated Creatinine Clearance: 79.7 mL/min (A) (by C-G formula based on SCr of 0.53 mg/dL (L)). Liver Function Tests: Recent Labs  Lab 08/13/22 0904  AST 16  ALT 20  ALKPHOS 62  BILITOT 0.2*  PROT 6.9  ALBUMIN 2.6*   No results for input(s): "LIPASE", "AMYLASE" in the last 168 hours. No results for input(s): "AMMONIA" in the last 168 hours. Coagulation Profile: No results for input(s): "INR", "PROTIME" in the last 168 hours. Cardiac Enzymes: No results for input(s): "CKTOTAL", "CKMB", "CKMBINDEX", "TROPONINI" in the last 168 hours. BNP (last 3 results) No results for input(s): "PROBNP" in the last 8760 hours. HbA1C: No results for input(s): "HGBA1C" in the last 72 hours. CBG: Recent Labs  Lab 08/14/22 1116 08/14/22 1613 08/14/22 2219 08/15/22 0004 08/15/22 0636  GLUCAP 342* 175* 112* 105* 228*   Lipid Profile: No results for input(s): "CHOL", "HDL", "LDLCALC", "TRIG", "CHOLHDL", "LDLDIRECT" in the last 72 hours. Thyroid Function Tests: No results for input(s): "TSH", "T4TOTAL", "FREET4", "T3FREE", "THYROIDAB" in the last 72 hours. Anemia Panel: No results for input(s): "VITAMINB12", "FOLATE", "FERRITIN", "TIBC", "IRON", "RETICCTPCT" in the last 72 hours. Sepsis Labs: No results for input(s): "PROCALCITON", "LATICACIDVEN" in the last 168 hours.  No results found for this or any previous visit (from the past 240 hour(s)).       Radiology Studies: No results found.      Scheduled Meds:  atorvastatin  40 mg Oral Daily   docusate sodium  100 mg Oral BID   enoxaparin (LOVENOX) injection  40 mg Subcutaneous Daily   escitalopram  20 mg  Oral Daily   feeding supplement  237 mL Oral TID BM   gabapentin  300 mg Oral TID   insulin aspart  0-20 Units Subcutaneous TID WC   insulin aspart  0-5 Units Subcutaneous QHS   insulin glargine-yfgn  20 Units Subcutaneous BID   lidocaine  1 patch Transdermal Q24H   morphine  15 mg Oral Q12H   multivitamin with minerals  1 tablet Oral Daily   polyethylene glycol  17 g Oral QHS   traZODone  50 mg Oral QHS   Continuous Infusions:   LOS: 17 days   Time spent= 35 mins    Valerie Cones Arsenio Loader, MD [facility]  If 7PM-7AM, please contact night-coverage  08/15/2022, 11:20 AM

## 2022-08-15 NOTE — Progress Notes (Signed)
Mobility Specialist Progress Note:   08/15/22 1235  Mobility  Activity Ambulated with assistance in hallway  Level of Assistance Standby assist, set-up cues, supervision of patient - no hands on  Assistive Device Front wheel walker  Distance Ambulated (ft) 250 ft  Activity Response Tolerated well  $Mobility charge 1 Mobility   Pt received in bed and agreeable. C/o of pain and expressed worry at possibility of discharge to home. Pt left sitting in chair with all needs met and call bell in reach.   Tabor Denham Mobility Specialist-Acute Rehab Secure Chat only

## 2022-08-15 NOTE — Progress Notes (Signed)
Physical Therapy Treatment Patient Details Name: Samuel [name] MRN: [id number] DOB: 1966-01-29 Today's Date: 08/15/2022   History of Present Illness 56 yo male with onset of intractible back pain with spine mets (unsure of primary per hx) was admitted on 9/7, now has plan for repletion of electrolytes, pain management and has pending plan for biopsy of lumbar spine and potential kyphoplasty.  Pain is in R hip and leg, low back and upper back at times.  Imaging has found mult thoracic spine spots that may be mets. 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 PMHx:  DM, pancreatitis, smoker, R rib and clavicle fractures,    PT Comments    Per patient CIR isn't a d/c option due to lack of insurance. Pt proactively asked about stair negotiation as he has stairs to enter his home. Pt just ambulated with mobility specialist but agreed to try stairs. Pt able to negotiate 4 at a time with bilat railing prior to needing to sit. Pt will need to complete 6-8 stairs to safely enter home. Discussed using a shower seat in his walk in shower for safe bathing. Acute PT to cont to follow.    Recommendations for follow up therapy are one component of a multi-disciplinary discharge planning process, led by the attending physician.  Recommendations may be updated based on patient status, additional functional criteria and insurance authorization.  Follow Up Recommendations  Acute inpatient rehab (3hours/day)     Assistance Recommended at Discharge    Patient can return home with the following A little help with walking and/or transfers;A little help with bathing/dressing/bathroom;Assistance with cooking/housework;Assist for transportation;Help with stairs or ramp for entrance   Equipment Recommendations  Rolling walker (2 wheels) (shower chair)    Recommendations for Other Services       Precautions / Restrictions Precautions Precautions: Fall;Back Precaution Booklet Issued: No Precaution Comments: reviewed  precautions Required Braces or Orthoses: Spinal Brace Spinal Brace: Thoracolumbosacral orthotic Restrictions Weight Bearing Restrictions: No     Mobility  Bed Mobility               General bed mobility comments: pt up in chair upon PT arrival    Transfers Overall transfer level: Needs assistance Equipment used: Rolling walker (2 wheels) Transfers: Sit to/from Stand Sit to Stand: Supervision           General transfer comment: increased time, pushes up with R UE on hand rest of chair and L UE on RW    Ambulation/Gait Ambulation/Gait assistance: Min guard Gait Distance (Feet): 60 Feet (x1 to rehab gym, just ambulated with mobility specialists) Assistive device: Rolling walker (2 wheels) Gait Pattern/deviations: Step-through pattern Gait velocity: dec Gait velocity interpretation: <1.31 ft/sec, indicative of household ambulator   General Gait Details: much better with decreasing bilat UE dependency   Stairs Stairs: Yes Stairs assistance: Min guard (with max sequencing verbal cues initially) Stair Management: Two rails, Step to pattern, Forwards Number of Stairs: 4 (x3 reps) General stair comments: educated pt on "up with the good (R LE), down with the bad (L LE). Pt progressively became more comfortable with increased trials however reports "phew thats some work and pain". pt very dependent on the railings, pt required seated rest break between each set of 4   Wheelchair Mobility    Modified Rankin (Stroke Patients Only)       Balance Overall balance assessment: Needs assistance Sitting-balance support: No upper extremity supported, Feet supported       Standing balance support: During functional activity,  Bilateral upper extremity supported Standing balance-Leahy Scale: Poor Standing balance comment: dependent on UEs                            Cognition Arousal/Alertness: Awake/alert Behavior During Therapy: WFL for tasks  assessed/performed Overall Cognitive Status: Within Functional Limits for tasks assessed                                 General Comments: now that CIR isn't an option pt working on problem solving on how to make home safer, asked to do stairs today in prep for d/c home        Exercises      General Comments General comments (skin integrity, edema, etc.): VSS      Pertinent Vitals/Pain Pain Assessment Pain Assessment: 0-10 Pain Score: 8  Pain Location: back L LE Pain Descriptors / Indicators: Shooting Pain Intervention(s): Monitored during session (RN provided pain meds after PT session)    Home Living                          Prior Function            PT Goals (current goals can now be found in the care plan section) Progress towards PT goals: Progressing toward goals    Frequency    Min 5X/week      PT Plan Current plan remains appropriate    Co-evaluation              AM-PAC PT "6 Clicks" Mobility   Outcome Measure  Help needed turning from your back to your side while in a flat bed without using bedrails?: None Help needed moving from lying on your back to sitting on the side of a flat bed without using bedrails?: None Help needed moving to and from a bed to a chair (including a wheelchair)?: A Little Help needed standing up from a chair using your arms (e.g., wheelchair or bedside chair)?: A Little Help needed to walk in hospital room?: A Little Help needed climbing 3-5 steps with a railing? : A Lot 6 Click Score: 19    End of Session Equipment Utilized During Treatment: Gait belt;Back brace Activity Tolerance: Patient limited by fatigue Patient left: in chair;with call bell/phone within reach Nurse Communication: Mobility status PT Visit Diagnosis: Unsteadiness on feet (R26.81);Pain;Difficulty in walking, not elsewhere classified (R26.2) Pain - Right/Left: Left Pain - part of body: Hip;Knee;Leg     Time:  9373-4287 PT Time Calculation (min) (ACUTE ONLY): 23 min  Charges:  $Gait Training: 23-37 mins                     Kittie Plater, PT, DPT Acute Rehabilitation Services Secure chat preferred Office #: [phone]    Berline Lopes 08/15/2022, 1:27 PM

## 2022-08-16 LAB — BASIC METABOLIC PANEL
Anion gap: 6 (ref 5–15)
BUN: 16 mg/dL (ref 6–20)
CO2: 30 mmol/L (ref 22–32)
Calcium: 9.2 mg/dL (ref 8.9–10.3)
Chloride: 101 mmol/L (ref 98–111)
Creatinine, Ser: 0.64 mg/dL (ref 0.61–1.24)
GFR, Estimated: >60 mL/min (ref >60)
Glucose, Bld: 101 mg/dL — ABNORMAL HIGH (ref 70–99)
Potassium: 4.2 mmol/L (ref 3.5–5.1)
Sodium: 137 mmol/L (ref 135–145)

## 2022-08-16 LAB — GLUCOSE, CAPILLARY
Glucose-Capillary: 73 mg/dL (ref 70–99)
Glucose-Capillary: 92 mg/dL (ref 70–99)
Glucose-Capillary: 313 mg/dL — ABNORMAL HIGH (ref 70–99)
Glucose-Capillary: 181 mg/dL — ABNORMAL HIGH (ref 70–99)

## 2022-08-16 LAB — CBC
HCT: 32.1 % — ABNORMAL LOW (ref 39.0–52.0)
Hemoglobin: 10.9 g/dL — ABNORMAL LOW (ref 13.0–17.0)
MCH: 31.8 pg (ref 26.0–34.0)
MCHC: 34 g/dL (ref 30.0–36.0)
MCV: 93.6 fL (ref 80.0–100.0)
Platelets: 348 10*3/uL (ref 150–400)
RBC: 3.43 MIL/uL — ABNORMAL LOW (ref 4.22–5.81)
RDW: 12.2 % (ref 11.5–15.5)
WBC: 9 10*3/uL (ref 4.0–10.5)
nRBC: 0 % (ref 0.0–0.2)

## 2022-08-16 LAB — MAGNESIUM: Magnesium: 1.8 mg/dL (ref 1.7–2.4)

## 2022-08-16 MED ORDER — INSULIN GLARGINE-YFGN 100 UNIT/ML ~~LOC~~ SOLN
15.0000 [IU] | Freq: Two times a day (BID) | SUBCUTANEOUS | Status: DC
  Administered 2022-08-16 – 2022-08-17 (×2): 15 [IU] via SUBCUTANEOUS
  Filled 2022-08-16 (×4): qty 0.15

## 2022-08-16 NOTE — Progress Notes (Signed)
Mobility Specialist: Progress Note   08/16/22 1310  Mobility  Activity Ambulated with assistance in hallway  Level of Assistance Standby assist, set-up cues, supervision of patient - no hands on  Assistive Device Front wheel walker  Distance Ambulated (ft) 370 ft (185'x2)  Activity Response Tolerated well  $Mobility charge 1 Mobility   Pt received in the bed and agreeable to mobility. Stopped x1 for seated break secondary to 7/10 LLE pain, otherwise asymptomatic. Pt back to bed per request with call bell and phone at his side.   [facility] Genie Mirabal Mobility Specialist Mobility Specialist [address]: [phone]

## 2022-08-16 NOTE — Progress Notes (Signed)
Nutrition Follow-up  DOCUMENTATION CODES:   Severe malnutrition in context of chronic illness  INTERVENTION:  Continue regular diet, double protein with meals Ensure Enlive po TID, each supplement provides 350 kcal and 20 grams of protein. MVI with minerals daily PM Snack from dining services  NUTRITION DIAGNOSIS:  Severe Malnutrition related to chronic illness (cancer) as evidenced by severe fat depletion, severe muscle depletion. - remains applicable  GOAL:  Patient will meet greater than or equal to 90% of their needs - diet and supplements in place  MONITOR:  PO intake, Supplement acceptance, Labs, Weight trends, I & O's  REASON FOR ASSESSMENT:  Consult Assessment of nutrition requirement/status  ASSESSMENT:  56 y.o. male with medical history significant of diabetes mellitus on insulin, anxiety and depression, and diabetic neuropathy.  Patient was in the process of undergoing malignancy work-up.  He was found to have back pain 4 months ago and has been receiving fragmented and incomplete care due to his social and financial circumstances.  He is currently being followed by Dr. Tyler Pita radiation oncology.  Pt resting in bed at the time of assessment. States he has been eating well and drinking most of his ensures. States he has only received 1 of his snacks from the kitchen - will double check with RN to ensure they are being delivered.   Average Meal Intake: 9/11-9/16: 56% intake x 7 recorded meals 9/17-9/26: 52% intake x 6 recorded meals  Nutritionally Relevant Medications: Scheduled Meds:  atorvastatin  40 mg Oral Daily   docusate sodium  100 mg Oral BID   Ensure Enlive  237 mL Oral TID BM   insulin aspart  0-20 Units Subcutaneous TID WC   insulin aspart  0-5 Units Subcutaneous QHS   insulin glargine-yfgn  20 Units Subcutaneous BID   multivitamin with minerals  1 tablet Oral Daily   polyethylene glycol  17 g Oral QHS   Labs Reviewed  NUTRITION - FOCUSED  PHYSICAL EXAM: Flowsheet Row Most Recent Value  Orbital Region Severe depletion  Upper Arm Region Severe depletion  Thoracic and Lumbar Region Severe depletion  Buccal Region Severe depletion  Temple Region Mild depletion  Clavicle Bone Region Severe depletion  Clavicle and Acromion Bone Region Severe depletion  Scapular Bone Region Severe depletion  Dorsal Hand Severe depletion  Patellar Region Severe depletion  Anterior Thigh Region Severe depletion  Posterior Calf Region Severe depletion  Edema (RD Assessment) Mild  Hair Reviewed  Eyes Reviewed  Mouth Reviewed  Skin Reviewed  Nails Reviewed    Diet Order:   Diet Order             Diet regular Room service appropriate? Yes; Fluid consistency: Thin  Diet effective now                   EDUCATION NEEDS:  Education needs have been addressed  Skin:  Skin Assessment: Skin Integrity Issues: Skin Integrity Issues:: Incisions Incisions: lower vertebral column  Last BM:  9/22  Height:  Ht Readings from Last 1 Encounters:  07/28/22 5\' 8"  (1.727 m)    Weight:  Wt Readings from Last 1 Encounters:  08/13/22 56 kg    Ideal Body Weight:  70 kg  BMI:  Body mass index is 18.77 kg/m.  Estimated Nutritional Needs:  Kcal:  2000-2200 kcal/d Protein:  105-120 grams Fluid:  >2L/d    Ranell Patrick, RD, LDN Clinical Dietitian RD pager # available in AMION  After hours/weekend pager # available in  AMION

## 2022-08-16 NOTE — Progress Notes (Signed)
[facility]  PROGRESS NOTE  TREMON SAINVIL II TIW:[id number] DOB: Jul 20, 1966 DOA: 07/28/2022 PCP: Martinique, [name], MD   Brief HPI:   28 regular-year-old male with past medical history of diabetes mellitus on insulin, recurrent pancreatitis, alcohol abuse, depression, diabetic neuropathy who was in process of undergoing malignancy work-up found to have back pain about 4 months ago, has been receiving fragmented and incomplete care due to social and financial circumstances, was found to have metastatic disease to the spine revealing metastatic L2 deposit which has progressed to compression fracture with spinal canal stenosis and lower extremity weakness.  Patient underwent CT-guided biopsy of L2 followed by radiofrequency ablation and balloon kyphoplasty with clinical improvement.  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  Plan for inpatient rehab.    Subjective   Patient seen and examined, no new complaints.   Assessment/Plan:    Metastatic cancer to spine -S/p L2 biopsy that showed metastatic adenocarcinoma of the lung -S/p radiofrequency ablation and balloon kyphoplasty with improved pain control and mobility -DLCO brace has been placed -Continue MS Contin and as needed analgesics -Radiation oncology on board, PET scan can be done as outpatient -Scheduled for 10 radiation treatments Monday through Friday to lumbar spine in 1 radiosurgery treatment to brain mets -Insurance did not approve inpatient rehab, plan to go home after radiation treatment with transportation arranged for outpatient radiation treatments  Newly diagnosed metastatic lung cancer -With mets to brain and bone, PET scan done was positive for 2 hypermetabolic right pulmonary nodules and adenopathy -MRI brain showed punctated lesion in the right cerebellum and left temporal lobe consistent with metastatic disease -Scheduled for radiosurgery as above -Oncology to follow-up as outpatient -Radiation  oncology consulted, recommended radiation for brain and spine  Abdominal appearing left lung on PET scan with groundglass opacities that are hypermetabolic -Patient remains asymptomatic with no hypoxia  Diabetes mellitus type 2 -Hemoglobin A1c 13 -Continue sliding scale insulin with NovoLog -Semglee 20 units subcu twice daily -CBG is in low this morning -We will cut down Semglee to 15 units subcu twice daily  Hypomagnesemia/hypokalemia -Replete  Depression/anxiety -Continue Lexapro, trazodone  Hyperlipidemia -Continue Lipitor  Normocytic anemia -Follow-up PCP as outpatient  Severe protein calorie malnutrition -Continue Ensure    Medications     atorvastatin  40 mg Oral Daily   docusate sodium  100 mg Oral BID   enoxaparin (LOVENOX) injection  40 mg Subcutaneous Daily   escitalopram  20 mg Oral Daily   feeding supplement  237 mL Oral TID BM   gabapentin  300 mg Oral TID   insulin aspart  0-20 Units Subcutaneous TID WC   insulin aspart  0-5 Units Subcutaneous QHS   insulin glargine-yfgn  20 Units Subcutaneous BID   lidocaine  1 patch Transdermal Q24H   morphine  15 mg Oral Q12H   multivitamin with minerals  1 tablet Oral Daily   polyethylene glycol  17 g Oral QHS   traZODone  50 mg Oral QHS     Data Reviewed:   CBG:  Recent Labs  Lab 08/15/22 1216 08/15/22 1703 08/15/22 2204 08/16/22 0620 08/16/22 1206  GLUCAP 111* 143* 164* 73 92    SpO2: 100 % O2 Flow Rate (L/min): 2 L/min    Vitals:   08/16/22 0426 08/16/22 0430 08/16/22 0749 08/16/22 1210  BP: 95/60  94/61 98/76  Pulse: 89  90 89  Resp: 12 16 20 20   Temp: 98.1 F (36.7 C)  98.1 F (36.7 C) 98.6  F (37 C)  TempSrc: Oral  Oral Oral  SpO2: 99%  100% 100%  Weight:      Height:          Data Reviewed:  Basic Metabolic Panel: Recent Labs  Lab 08/13/22 0904 08/14/22 0725 08/15/22 0853 08/16/22 0414  NA 137 139 138 137  K 4.3 4.0 4.5 4.2  CL 100 103 102 101  CO2 27 29 30 30    GLUCOSE 180* 65* 146* 101*  BUN 15 13 15 16   CREATININE 0.51* 0.43* 0.53* 0.64  CALCIUM 9.5 9.5 9.6 9.2  MG  --  2.0 1.8 1.8    CBC: Recent Labs  Lab 08/13/22 0904 08/14/22 0725 08/15/22 0853 08/16/22 0414  WBC 9.4 8.2 10.0 9.0  HGB 11.1* 11.0* 11.2* 10.9*  HCT 32.8* 33.8* 34.0* 32.1*  MCV 94.0 96.0 95.5 93.6  PLT 325 354 367 348    LFT Recent Labs  Lab 08/13/22 0904  AST 16  ALT 20  ALKPHOS 62  BILITOT 0.2*  PROT 6.9  ALBUMIN 2.6*     Antibiotics: Anti-infectives (From admission, onward)    Start     Dose/Rate Route Frequency Ordered Stop   08/01/22 0900  vancomycin (VANCOCIN) IVPB 1000 mg/200 mL premix        1,000 mg 200 mL/hr over 60 Minutes Intravenous To Radiology 08/01/22 0803 08/01/22 1155        DVT prophylaxis: Lovenox  Code Status: Full code  Family Communication: No family at bedside   CONSULTS oncology   Objective    Physical Examination:   General-appears in no acute distress Heart-S1-S2, regular, no murmur auscultated Lungs-clear to auscultation bilaterally, no wheezing or crackles auscultated Abdomen-soft, nontender, no organomegaly Extremities-no edema in the lower extremities Neuro-alert, oriented x3, no focal deficit noted  Status is: Inpatient:          Oswald Hillock   [facility] If 7PM-7AM, please contact night-coverage at [email or web address], Office  [phone]   08/16/2022, 3:44 PM  LOS: 18 days

## 2022-08-16 NOTE — Progress Notes (Signed)
Occupational Therapy Treatment Patient Details Name: Samuel [name] MRN: [id number] DOB: 1966/06/18 Today's Date: 08/16/2022   History of present illness 56 yo male with onset of intractible back pain with spine mets (unsure of primary per hx) was admitted on 9/7, now has plan for repletion of electrolytes, pain management and has pending plan for biopsy of lumbar spine and potential kyphoplasty.  Pain is in R hip and leg, low back and upper back at times.  Imaging has found mult thoracic spine spots that may be mets. 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 PMHx:  DM, pancreatitis, smoker, R rib and clavicle fractures,   OT comments  Pt supine in bed and agreeable to OT session. Reports plan to dc home now.  Reviewed shower transfers using [place] and RW with supervision, min cueing for safety. Reviewed ADLs in sitting, compensatory techniques due to limited standing tolerance and balance without UE support.   He demonstrates improved awareness to needs for dc home, discussed planning home setup and needs while he is alone.  Most concerned for transportation to radiation, informed CM who is already working on this.  Will follow acutely.    Recommendations for follow up therapy are one component of a multi-disciplinary discharge planning process, led by the attending physician.  Recommendations may be updated based on patient status, additional functional criteria and insurance authorization.    Follow Up Recommendations  Home health OT    Assistance Recommended at Discharge Intermittent Supervision/Assistance  Patient can return home with the following  A little help with walking and/or transfers;A little help with bathing/dressing/bathroom;Assistance with cooking/housework;Assist for transportation;Help with stairs or ramp for entrance   Equipment Recommendations  Tub/shower seat    Recommendations for Other Services      Precautions / Restrictions Precautions Precautions: Fall;Back Precaution Booklet  Issued: No Precaution Comments: reviewed precautions Required Braces or Orthoses: Spinal Brace Spinal Brace: Thoracolumbosacral orthotic Restrictions Weight Bearing Restrictions: No       Mobility Bed Mobility Overal bed mobility: Independent                  Transfers Overall transfer level: Needs assistance Equipment used: Rolling walker (2 wheels) Transfers: Sit to/from Stand Sit to Stand: Supervision           General transfer comment: cueing for hand placement, safety     Balance Overall balance assessment: Needs assistance Sitting-balance support: No upper extremity supported, Feet supported Sitting balance-Leahy Scale: Good     Standing balance support: Bilateral upper extremity supported, During functional activity Standing balance-Leahy Scale: Poor Standing balance comment: dependent on UEs dynamically, able to engage in ADLs with 0-1 hand support briefly                           ADL either performed or assessed with clinical judgement   ADL Overall ADL's : Needs assistance/impaired     Grooming: Supervision/safety;Wash/dry hands;Standing           Upper Body Dressing : Set up;Sitting Upper Body Dressing Details (indicate cue type and reason): to don brace Lower Body Dressing: Sit to/from stand;Supervision/safety Lower Body Dressing Details (indicate cue type and reason): figure 4 technique at EOB, no assist in standing Toilet Transfer: Modified Independent;Ambulation;Regular Toilet;Rolling walker (2 wheels)   Toileting- Clothing Manipulation and Hygiene: Modified independent;Sit to/from stand   Tub/ Shower Transfer: Walk-in shower;Supervision/safety;Ambulation;Shower Scientist, research (medical) Details (indicate cue type and reason): simulated in room using reverse step, [place] and RW.  min cueing for technique and to avoid twisting. Functional mobility during ADLs: Supervision/safety;Rolling walker (2 wheels) General ADL Comments:  demonstrating simulated bathing on [place] with out assist, reviewed compensatory techniques and planning for pain mgmt/energy conservation techniques    Extremity/Trunk Assessment              Vision       Perception     Praxis      Cognition Arousal/Alertness: Awake/alert Behavior During Therapy: WFL for tasks assessed/performed Overall Cognitive Status: Within Functional Limits for tasks assessed                                 General Comments: pt demonstrates good awareness of deficits and is problem solving through activity progression, home setup in prep for dc home        Exercises      Shoulder Instructions       General Comments VSS    Pertinent Vitals/ Pain       Pain Assessment Pain Assessment: Faces Faces Pain Scale: Hurts even more Pain Location: Low back and LLE Pain Descriptors / Indicators: Discomfort, Shooting Pain Intervention(s): Monitored during session, Repositioned, Patient requesting pain meds-RN notified  Home Living                                          Prior Functioning/Environment              Frequency  Min 2X/week        Progress Toward Goals  OT Goals(current goals can now be found in the care plan section)  Progress towards OT goals: Progressing toward goals  Acute Rehab OT Goals Patient Stated Goal: figure out transportation OT Goal Formulation: With patient Time For Goal Achievement: 08/17/22 Potential to Achieve Goals: Good  Plan Frequency remains appropriate;Discharge plan needs to be updated    Co-evaluation                 AM-PAC OT "6 Clicks" Daily Activity     Outcome Measure   Help from another person eating meals?: None Help from another person taking care of personal grooming?: A Little Help from another person toileting, which includes using toliet, bedpan, or urinal?: A Little Help from another person bathing (including washing, rinsing, drying)?: A  Little Help from another person to put on and taking off regular upper body clothing?: A Little Help from another person to put on and taking off regular lower body clothing?: A Little 6 Click Score: 19    End of Session Equipment Utilized During Treatment: Rolling walker (2 wheels);Back brace  OT Visit Diagnosis: Other abnormalities of gait and mobility (R26.89);Muscle weakness (generalized) (M62.81);Pain Pain - Right/Left: Left Pain - part of body: Leg   Activity Tolerance Patient tolerated treatment well   Patient Left in bed;with call bell/phone within reach   Nurse Communication Mobility status        Time: 8119-1478 OT Time Calculation (min): 31 min  Charges: OT General Charges $OT Visit: 1 Visit OT Treatments $Self Care/Home Management : 23-37 mins  [place] Office [phone]   Delight Stare 08/16/2022, 1:11 PM

## 2022-08-16 NOTE — Progress Notes (Signed)
Inpatient Rehab Admissions Coordinator:    Met with patient at bedside. He reports no change to insurance status. Planning to go home now. Will sign off.   Rehab Admissons Coordinator [place], [place], [place] [phone]

## 2022-08-16 NOTE — Progress Notes (Signed)
Physical Therapy Treatment Patient Details Name: Samuel [name] MRN: [id number] DOB: [birth year] Today's Date: 08/16/2022   History of Present Illness 56 yo male with onset of intractible back pain with spine mets (unsure of primary per hx) was admitted on 9/7, now has plan for repletion of electrolytes, pain management and has pending plan for biopsy of lumbar spine and potential kyphoplasty.  Pain is in R hip and leg, low back and upper back at times.  Imaging has found mult thoracic spine spots that may be mets. 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 PMHx:  DM, pancreatitis, smoker, R rib and clavicle fractures,    PT Comments    Pt progressing well with stair negotiation. Pt continues to dependent on bilat UE/RW for safe ambulation. Pt did demo ability to complete 6 steps safely to mimic home set up of 6 STE with bilat hand rails. Pt continues to require rest breaks due to increased pain with mobility. Acute PT to cont to follow.    Recommendations for follow up therapy are one component of a multi-disciplinary discharge planning process, led by the attending physician.  Recommendations may be updated based on patient status, additional functional criteria and insurance authorization.  Follow Up Recommendations  Home health PT Can patient physically be transported by private vehicle: Yes   Assistance Recommended at Discharge Intermittent Supervision/Assistance  Patient can return home with the following A little help with walking and/or transfers;A little help with bathing/dressing/bathroom;Assistance with cooking/housework;Assist for transportation;Help with stairs or ramp for entrance   Equipment Recommendations  Rolling walker (2 wheels) (shower chair)    Recommendations for Other Services       Precautions / Restrictions Precautions Precautions: Fall;Back Precaution Booklet Issued: No Precaution Comments: reviewed precautions Required Braces or Orthoses: Spinal Brace Spinal Brace:  Thoracolumbosacral orthotic Restrictions Weight Bearing Restrictions: No     Mobility  Bed Mobility Overal bed mobility: Modified Independent Bed Mobility: Supine to Sit Rolling: Modified independent (Device/Increase time) Sidelying to sit: Modified independent (Device/Increase time) Supine to sit: Modified independent (Device/Increase time) Sit to supine: Mod assist Sit to sidelying: Supervision General bed mobility comments: HOB flat, pt uses bed rail to assist with rolling to the L, no physical assist needed    Transfers Overall transfer level: Needs assistance Equipment used: Rolling walker (2 wheels) Transfers: Sit to/from Stand Sit to Stand: Supervision Stand pivot transfers: Min guard         General transfer comment: no physical assist    Ambulation/Gait Ambulation/Gait assistance: Min guard Gait Distance (Feet): 60 Feet Assistive device: Rolling walker (2 wheels) Gait Pattern/deviations: Step-through pattern Gait velocity: dec     General Gait Details: much better with decreasing bilat UE dependency   Stairs Stairs: Yes Stairs assistance: Min guard Stair Management: Two rails, Step to pattern, Forwards Number of Stairs: 6 (x2 sets) General stair comments: reminded pt on "up with the good (R LE), down with the bad (L LE)." Completed a set of 6 to mimic home set up. Pt c/o increased back pain but no physical assist needed, increased time and increased dependency on UEs   Wheelchair Mobility    Modified Rankin (Stroke Patients Only)       Balance Overall balance assessment: Needs assistance Sitting-balance support: No upper extremity supported, Feet supported Sitting balance-Leahy Scale: Good Sitting balance - Comments: pt able to don brace on EOB without LOB Postural control: Posterior lean Standing balance support: Bilateral upper extremity supported, During functional activity Standing balance-Leahy Scale: Poor Standing balance comment:  dependent on UEs dynamically, able to engage in ADLs with 0-1 hand support briefly                            Cognition Arousal/Alertness: Awake/alert Behavior During Therapy: WFL for tasks assessed/performed Overall Cognitive Status: Within Functional Limits for tasks assessed Area of Impairment: Memory                     Memory: Decreased recall of precautions         General Comments: pt demonstrates good awareness of deficits and is problem solving through activity progression, home setup in prep for dc home        Exercises General Exercises - Lower Extremity Ankle Circles/Pumps: AROM, Both, 15 reps Long Arc Quad: AROM, Both, 10 reps Hip Flexion/Marching: AROM, Both, 10 reps Other Exercises Other Exercises: sit to stands with RLE extended to allow for increased force production from LLE    General Comments General comments (skin integrity, edema, etc.): VSS      Pertinent Vitals/Pain Pain Assessment Pain Assessment: 0-10 Pain Score: 8  Faces Pain Scale: Hurts even more Pain Location: Low back radiating down to thigh, worse with mobilizing, decreases when sitting Pain Descriptors / Indicators: Discomfort, Shooting Pain Intervention(s): Monitored during session (provided rest breaks)    Home Living                          Prior Function            PT Goals (current goals can now be found in the care plan section) Acute Rehab PT Goals Patient Stated Goal: return to indep PT Goal Formulation: With patient Time For Goal Achievement: 08/27/22 Potential to Achieve Goals: Good Progress towards PT goals: Progressing toward goals    Frequency    Min 5X/week      PT Plan Current plan remains appropriate    Co-evaluation              AM-PAC PT "6 Clicks" Mobility   Outcome Measure  Help needed turning from your back to your side while in a flat bed without using bedrails?: None Help needed moving from lying on your  back to sitting on the side of a flat bed without using bedrails?: None Help needed moving to and from a bed to a chair (including a wheelchair)?: A Little Help needed standing up from a chair using your arms (e.g., wheelchair or bedside chair)?: A Little Help needed to walk in hospital room?: A Little Help needed climbing 3-5 steps with a railing? : A Lot 6 Click Score: 19    End of Session Equipment Utilized During Treatment: Back brace;Gait belt Activity Tolerance: Patient limited by pain Patient left: in chair;with call bell/phone within reach Nurse Communication: Mobility status PT Visit Diagnosis: Unsteadiness on feet (R26.81);Pain;Difficulty in walking, not elsewhere classified (R26.2) Pain - Right/Left: Left Pain - part of body: Hip;Knee;Leg     Time: 6962-9528 PT Time Calculation (min) (ACUTE ONLY): 37 min  Charges:  $Gait Training: 23-37 mins                     Kittie Plater, PT, DPT Acute Rehabilitation Services Secure chat preferred Office #: [phone]    Berline Lopes 08/16/2022, 2:55 PM

## 2022-08-17 ENCOUNTER — Other Ambulatory Visit (HOSPITAL_COMMUNITY): Payer: Self-pay

## 2022-08-17 ENCOUNTER — Ambulatory Visit
Admit: 2022-08-17 | Discharge: 2022-08-17 | Disposition: A | Discharge status: Home / Self Care | Payer: BLUE CROSS/BLUE SHIELD | Attending: Radiation Oncology | Admitting: Radiation Oncology

## 2022-08-17 ENCOUNTER — Other Ambulatory Visit: Payer: Self-pay

## 2022-08-17 DIAGNOSIS — C7931 Secondary malignant neoplasm of brain: Secondary | ICD-10-CM

## 2022-08-17 LAB — CBC
HCT: 31.1 % — ABNORMAL LOW (ref 39.0–52.0)
Hemoglobin: 10.5 g/dL — ABNORMAL LOW (ref 13.0–17.0)
MCH: 32 pg (ref 26.0–34.0)
MCHC: 33.8 g/dL (ref 30.0–36.0)
MCV: 94.8 fL (ref 80.0–100.0)
Platelets: 320 10*3/uL (ref 150–400)
RBC: 3.28 MIL/uL — ABNORMAL LOW (ref 4.22–5.81)
RDW: 12.3 % (ref 11.5–15.5)
WBC: 7.7 10*3/uL (ref 4.0–10.5)
nRBC: 0 % (ref 0.0–0.2)

## 2022-08-17 LAB — RAD ONC ARIA SESSION SUMMARY
Course Elapsed Days: 0
Plan Fractions Treated to Date: 1
Plan Prescribed Dose Per Fraction: 20 Gy
Plan Total Fractions Prescribed: 1
Plan Total Prescribed Dose: 20 Gy
Reference Point Dosage Given to Date: 20 Gy
Reference Point Session Dosage Given: 20 Gy
Session Number: 1

## 2022-08-17 LAB — BASIC METABOLIC PANEL
Anion gap: 4 — ABNORMAL LOW (ref 5–15)
BUN: 17 mg/dL (ref 6–20)
CO2: 29 mmol/L (ref 22–32)
Calcium: 8.9 mg/dL (ref 8.9–10.3)
Chloride: 104 mmol/L (ref 98–111)
Creatinine, Ser: 0.54 mg/dL — ABNORMAL LOW (ref 0.61–1.24)
GFR, Estimated: >60 mL/min (ref >60)
Glucose, Bld: 129 mg/dL — ABNORMAL HIGH (ref 70–99)
Potassium: 3.9 mmol/L (ref 3.5–5.1)
Sodium: 137 mmol/L (ref 135–145)

## 2022-08-17 LAB — MAGNESIUM: Magnesium: 1.7 mg/dL (ref 1.7–2.4)

## 2022-08-17 LAB — GLUCOSE, CAPILLARY: Glucose-Capillary: 69 mg/dL — ABNORMAL LOW (ref 70–99)

## 2022-08-17 MED ORDER — OXYCODONE HCL 5 MG PO TABS: 5.0000 mg | ORAL_TABLET | Freq: Four times a day (QID) | ORAL | 0 refills | Status: DC | PRN

## 2022-08-17 MED ORDER — GABAPENTIN 300 MG PO CAPS: 300.0000 mg | ORAL_CAPSULE | Freq: Three times a day (TID) | ORAL | 3 refills | Status: DC

## 2022-08-17 MED ORDER — GLUCOSE BLOOD VI STRP
ORAL_STRIP | 0 refills | Status: DC
  Filled 2022-08-17: qty 100, 25d supply, fill #0

## 2022-08-17 MED ORDER — BLOOD GLUCOSE MONITOR KIT
PACK | 0 refills | Status: DC
  Filled 2022-08-17: qty 1, 30d supply, fill #0

## 2022-08-17 MED ORDER — DOCUSATE SODIUM 100 MG PO CAPS: 100.0000 mg | ORAL_CAPSULE | Freq: Two times a day (BID) | ORAL | 0 refills | Status: DC

## 2022-08-17 MED ORDER — NOVOLOG MIX 70/30 FLEXPEN (70-30) 100 UNIT/ML ~~LOC~~ SUPN: 10.0000 [IU] | PEN_INJECTOR | Freq: Two times a day (BID) | SUBCUTANEOUS | 5 refills | Status: DC

## 2022-08-17 MED ORDER — ESCITALOPRAM OXALATE 20 MG PO TABS
20.0000 mg | ORAL_TABLET | Freq: Every day | ORAL | 2 refills | Status: DC
  Filled 2022-08-17: qty 30, 30d supply, fill #0

## 2022-08-17 MED ORDER — DOCUSATE SODIUM 100 MG PO CAPS
100.0000 mg | ORAL_CAPSULE | Freq: Two times a day (BID) | ORAL | 0 refills | Status: DC
  Filled 2022-08-17: qty 30, 15d supply, fill #0

## 2022-08-17 MED ORDER — IPRATROPIUM-ALBUTEROL 0.5-2.5 (3) MG/3ML IN SOLN: 3.0000 mL | Freq: Four times a day (QID) | RESPIRATORY_TRACT | 0 refills | Status: DC | PRN

## 2022-08-17 MED ORDER — ESCITALOPRAM OXALATE 20 MG PO TABS: 20.0000 mg | ORAL_TABLET | Freq: Every day | ORAL | 2 refills | Status: DC

## 2022-08-17 MED ORDER — INSULIN PEN NEEDLE 32G X 4 MM MISC
3 refills | Status: DC
  Filled 2022-08-17: qty 100, 25d supply, fill #0

## 2022-08-17 MED ORDER — INSULIN PEN NEEDLE 32G X 4 MM MISC: 3 refills | Status: DC

## 2022-08-17 MED ORDER — POLYETHYLENE GLYCOL 3350 17 G PO PACK: 17.0000 g | PACK | Freq: Every day | ORAL | 0 refills | Status: DC | PRN

## 2022-08-17 MED ORDER — BLOOD GLUCOSE MONITOR KIT: PACK | 0 refills | Status: DC

## 2022-08-17 MED ORDER — POLYETHYLENE GLYCOL 3350 17 GM/SCOOP PO POWD
17.0000 g | Freq: Every day | ORAL | 0 refills | Status: DC | PRN
  Filled 2022-08-17: qty 238, 14d supply, fill #0

## 2022-08-17 MED ORDER — INSULIN LISPRO PROT & LISPRO (75-25 MIX) 100 UNIT/ML KWIKPEN
10.0000 [IU] | PEN_INJECTOR | Freq: Two times a day (BID) | SUBCUTANEOUS | 5 refills | Status: DC
  Filled 2022-08-17 (×2): qty 6, 30d supply, fill #0

## 2022-08-17 MED ORDER — GABAPENTIN 300 MG PO CAPS
300.0000 mg | ORAL_CAPSULE | Freq: Three times a day (TID) | ORAL | 3 refills | Status: DC
  Filled 2022-08-17: qty 90, 30d supply, fill #0

## 2022-08-17 MED ORDER — IPRATROPIUM-ALBUTEROL 0.5-2.5 (3) MG/3ML IN SOLN
3.0000 mL | Freq: Four times a day (QID) | RESPIRATORY_TRACT | 0 refills | Status: DC | PRN
  Filled 2022-08-17: qty 180, 15d supply, fill #0

## 2022-08-17 MED ORDER — ATORVASTATIN CALCIUM 40 MG PO TABS: 40.0000 mg | ORAL_TABLET | Freq: Every day | ORAL | 2 refills | Status: DC

## 2022-08-17 MED ORDER — OXYCODONE HCL 5 MG PO TABS
5.0000 mg | ORAL_TABLET | Freq: Four times a day (QID) | ORAL | 0 refills | Status: DC | PRN
  Filled 2022-08-17: qty 20, 5d supply, fill #0

## 2022-08-17 MED ORDER — ATORVASTATIN CALCIUM 40 MG PO TABS
40.0000 mg | ORAL_TABLET | Freq: Every day | ORAL | 2 refills | Status: DC
  Filled 2022-08-17: qty 30, 30d supply, fill #0

## 2022-08-17 MED ORDER — FINGERSTIX LANCETS MISC
0 refills | Status: DC
  Filled 2022-08-17: qty 100, 25d supply, fill #0

## 2022-08-17 NOTE — Inpatient Diabetes Management (Signed)
Inpatient Diabetes Program Recommendations  AACE/ADA: New Consensus Statement on Inpatient Glycemic Control (2015)  Target Ranges:  Prepandial:   less than 140 mg/dL      Peak postprandial:   less than 180 mg/dL (1-2 hours)      Critically ill patients:  140 - 180 mg/dL   Lab Results  Component Value Date   GLUCAP 69 (L) 08/17/2022   HGBA1C 13.0 (H) 07/29/2022    Latest Reference Range & Units 08/17/22 07:16  Glucose 70 - 99 mg/dL 129 (H)  (H): Data is abnormally high  Latest Reference Range & Units 08/17/22 12:17  Glucose-Capillary 70 - 99 mg/dL 69 (L)  (L): Data is abnormally low Review of Glycemic Control (H): Data is abnormally high (L): Data is abnormally low  Diabetes history: DM Outpatient Diabetes medications:  Lantus 32 units QD, Metformin 1000 mg BID Current orders for Inpatient glycemic control: Semglee 15 units BID, Novolog 0-20 units TID and 0-5 units QHS  Serum glucose was 129 mg/dL this am 07:16.  Correction was given at 10:00 (3 hrs late).  69 mg/dL at 12:17.    Please use capillary glucose for correction and nor serum glucose; also please give correction within 1 hr.    Will continue to follow while inpatient.  Thank you, Reche Dixon, MSN, [place] Diabetes Coordinator Inpatient Diabetes Program [phone] (team pager from 8a-5p)

## 2022-08-17 NOTE — Progress Notes (Signed)
Patient to nursing for 30 minute observation Samuel [name].  Patient alert, oriented, and verbally responsive denies headache, ringing in ears, and nausea.  Reports feeling fatigue and some mild vision changes.  Can ambulate with walker, but was taking out of nursing via stretcher an [facility] transport back to [employer].  Vitals:  98.4-97-18-129/77 O2 sat 100% room air.  Sister at side.

## 2022-08-17 NOTE — Progress Notes (Signed)
Mobility Specialist: Progress Note   08/17/22 1152  Mobility  Activity Ambulated with assistance in hallway  Level of Assistance Standby assist, set-up cues, supervision of patient - no hands on  Assistive Device Front wheel walker  Distance Ambulated (ft) 320 ft  Activity Response Tolerated well  $Mobility charge 1 Mobility   Pt received in the bed and agreeable to mobility. C/o LLE pain during ambulation, no rating given. Pt back to bed after session with call bell and phone at his side.   [facility] Sanjuanita Condrey Mobility Specialist Mobility Specialist [address]: [phone]

## 2022-08-17 NOTE — Progress Notes (Signed)
Occupational Therapy Treatment Patient Details Name: Samuel [name] MRN: [id number] DOB: [birth year] Today's Date: 08/17/2022   History of present illness 56 yo male with onset of intractible back pain with spine mets (unsure of primary per hx) was admitted on 9/7, now has plan for repletion of electrolytes, pain management and has pending plan for biopsy of lumbar spine and potential kyphoplasty.  Pain is in R hip and leg, low back and upper back at times.  Imaging has found mult thoracic spine spots that may be mets. 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 PMHx:  DM, pancreatitis, smoker, R rib and clavicle fractures,   OT comments  Pt progressing well, plan for dc home today but reports still waiting to make sure he can get transportation (CM aware).  Patient with good recall of safety and energy conservation techniques for ADLs. Reporting "you'd be proud of me, I just washed up at the sink sitting and standing".  Reviewed shower transfers with min guard, cueing for technique and safety--agreeable to have sitting nearby initially for safety. Will follow acutely.    Recommendations for follow up therapy are one component of a multi-disciplinary discharge planning process, led by the attending physician.  Recommendations may be updated based on patient status, additional functional criteria and insurance authorization.    Follow Up Recommendations  Home health OT    Assistance Recommended at Discharge Intermittent Supervision/Assistance  Patient can return home with the following  A little help with bathing/dressing/bathroom;Assistance with cooking/housework;Assist for transportation;Help with stairs or ramp for entrance   Equipment Recommendations  Tub/shower seat    Recommendations for Other Services      Precautions / Restrictions Precautions Precautions: Fall;Back Precaution Booklet Issued: No Precaution Comments: reviewed precautions Required Braces or Orthoses: Spinal Brace Spinal Brace:  Thoracolumbosacral orthotic Restrictions Weight Bearing Restrictions: No       Mobility Bed Mobility Overal bed mobility: Independent             General bed mobility comments: HOB flat without rails. no assist required    Transfers Overall transfer level: Needs assistance Equipment used: Rolling walker (2 wheels) Transfers: Sit to/from Stand Sit to Stand: Modified independent (Device/Increase time)                 Balance Overall balance assessment: Needs assistance Sitting-balance support: No upper extremity supported, Feet supported Sitting balance-Leahy Scale: Good     Standing balance support: Bilateral upper extremity supported, During functional activity, No upper extremity supported Standing balance-Leahy Scale: Poor Standing balance comment: no UE support briefly with close supervision, relies on 1-2 hand support                           ADL either performed or assessed with clinical judgement   ADL Overall ADL's : Needs assistance/impaired                 Upper Body Dressing : Modified independent;Sitting Upper Body Dressing Details (indicate cue type and reason): to don brace     Toilet Transfer: Modified Independent;Ambulation Toilet Transfer Details (indicate cue type and reason): RW     Tub/ Shower Transfer: Min guard;Walk-in shower;Shower seat;Rolling walker (2 wheels)     General ADL Comments: min cueing to recall safe shower techniques, reports standing/sitting bathing at sink without assist today. Agreeable to have his sister present nearby for inital few shower.    Extremity/Trunk Assessment  Vision       Perception     Praxis      Cognition Arousal/Alertness: Awake/alert Behavior During Therapy: WFL for tasks assessed/performed Overall Cognitive Status: Within Functional Limits for tasks assessed                                          Exercises      Shoulder  Instructions       General Comments VSS    Pertinent Vitals/ Pain       Pain Assessment Pain Assessment: Faces Faces Pain Scale: Hurts a little bit Pain Location: low back Pain Descriptors / Indicators: Discomfort Pain Intervention(s): Monitored during session  Home Living                                          Prior Functioning/Environment              Frequency  Min 2X/week        Progress Toward Goals  OT Goals(current goals can now be found in the care plan section)  Progress towards OT goals: Progressing toward goals  Acute Rehab OT Goals Patient Stated Goal: home today OT Goal Formulation: With patient Time For Goal Achievement: 08/17/22 Potential to Achieve Goals: Good  Plan Discharge plan remains appropriate;Frequency remains appropriate    Co-evaluation                 AM-PAC OT "6 Clicks" Daily Activity     Outcome Measure   Help from another person eating meals?: None Help from another person taking care of personal grooming?: A Little Help from another person toileting, which includes using toliet, bedpan, or urinal?: None Help from another person bathing (including washing, rinsing, drying)?: A Little Help from another person to put on and taking off regular upper body clothing?: None Help from another person to put on and taking off regular lower body clothing?: None 6 Click Score: 22    End of Session Equipment Utilized During Treatment: Rolling walker (2 wheels);Back brace  OT Visit Diagnosis: Other abnormalities of gait and mobility (R26.89);Muscle weakness (generalized) (M62.81);Pain Pain - Right/Left: Left Pain - part of body: Leg   Activity Tolerance Patient tolerated treatment well   Patient Left in bed;with call bell/phone within reach   Nurse Communication Mobility status        Time: 1194-1740 OT Time Calculation (min): 11 min  Charges: OT General Charges $OT Visit: 1 Visit OT  Treatments $Self Care/Home Management : 8-22 mins  Jolaine Artist, [place] Office [phone]   Delight Stare 08/17/2022, 12:27 PM

## 2022-08-17 NOTE — TOC Transition Note (Addendum)
Transition of Care [facility]) - CM/SW Discharge Note   Patient Details  Name: Samuel [name] MRN: [id number] Date of Birth: 15-Jul-1966  Transition of [facility]) CM/SW Contact:  Pollie Friar, RN Phone Number: 08/17/2022, 1:02 PM   Clinical Narrative:    Pt discharging home with home health services through [place]. Pt currently doesn't have insurance but will starting 08/21/22. He is to call and provide the information to [place] so they can provide [facility] services.   Transportation arranged with the Cancer center to provide transportation to and from his radiation appointments. Information on the AVS and provided to the patient per transportation services.   Shower seat and walker in the room for home.   Pt's sister to provide transportation home.   1700: nebulizer machine ordered with charity through [place].  Since pt has no current insurance CM provided MATCH and meds filled through [place] and delivered to the room.   Final next level of care: [place] Barriers to Discharge: No Barriers Identified   Patient Goals and CMS Choice Patient states their goals for this hospitalization and ongoing recovery are:: SNF CMS Medicare.gov Compare Post Acute Care list provided to:: Patient Choice offered to / list presented to : Patient  Discharge Placement                       Discharge Plan and Services In-house Referral: Clinical Social Work              DME Arranged: Gilford Rile rolling DME Agency: [employer] Date DME Agency Contacted: 08/16/22   Representative spoke with at DME Agency: charity [place]: PT, OT [place] Agency: [place] Date [place]: 08/17/22   Representative spoke with at [place]: [place] ([place]) Interventions     Readmission Risk Interventions     No data to display

## 2022-08-17 NOTE — Discharge Summary (Addendum)
Physician Discharge Summary   Patient: Samuel [name] MRN: [id number] DOB: 03-17-1966  Admit date:     07/28/2022  Discharge date: 08/17/22  Discharge Physician: Oswald Hillock   PCP: [place], [name], MD   Recommendations at discharge:   Follow-up oncology as outpatient Patient to get radiation treatment to spine as outpatient, transportation has been arranged.  Discharge Diagnoses: Principal Problem:   Metastatic cancer to spine [facility]) Active Problems:   Diabetes ([place])   L2 vertebral fracture ([facility])   Protein-calorie malnutrition, severe ([place])   FTT (failure to thrive) in adult   Primary lung adenocarcinoma ([place])   Metastatic cancer to brain [facility])  Resolved Problems:   * No resolved hospital problems. *  Hospital Course:  56 regular-year-old male with past medical history of diabetes mellitus on insulin, recurrent pancreatitis, alcohol abuse, depression, diabetic neuropathy who was in process of undergoing malignancy work-up found to have back pain about 4 months ago, has been receiving fragmented and incomplete care due to social and financial circumstances, was found to have metastatic disease to the spine revealing metastatic L2 deposit which has progressed to compression fracture with spinal canal stenosis and lower extremity weakness.  Patient underwent CT-guided biopsy of L2 followed by radiofrequency ablation and balloon kyphoplasty with clinical improvement.  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  Plan for inpatient rehab.  Assessment and Plan:  Metastatic cancer to spine -S/p L2 biopsy that showed metastatic adenocarcinoma of the lung -S/p radiofrequency ablation and balloon kyphoplasty with improved pain control and mobility -DLCO brace has been placed -Continue MS Contin and as needed analgesics -Radiation oncology on board, PET scan can be done as outpatient -Scheduled for 10 radiation treatments Monday through Friday to lumbar spine in 1 radiosurgery  treatment to brain mets -Insurance did not approve inpatient rehab, plan to go home after radiation treatment with transportation arranged for outpatient radiation treatments   Newly diagnosed metastatic lung cancer -With mets to brain and bone, PET scan done was positive for 2 hypermetabolic right pulmonary nodules and adenopathy -MRI brain showed punctated lesion in the right cerebellum and left temporal lobe consistent with metastatic disease -Scheduled for radiosurgery as above -Oncology to follow-up as outpatient -Radiation oncology consulted, recommended radiation for brain and spine   Abdominal appearing left lung on PET scan with groundglass opacities that are hypermetabolic -Patient remains asymptomatic with no hypoxia   Diabetes mellitus type 2 -Hemoglobin A1c 13 -We will discharge on NovoLog 70/30,  10 units subcu twice daily -Will need adjustment of insulin based on blood sugar at home -Will need to follow-up PCP in 1 week   Hypomagnesemia/hypokalemia -Replete   Depression/anxiety -Continue Lexapro, trazodone   Hyperlipidemia -Continue Lipitor   Normocytic anemia -Follow-up PCP as outpatient   Severe protein calorie malnutrition -Continue Ensure         Consultants: Oncology Procedures performed: None Disposition: Home Diet recommendation:  Discharge Diet Orders (From admission, onward)     Start     Ordered   08/17/22 0000  Diet - low sodium heart healthy        08/17/22 1322           Regular diet DISCHARGE MEDICATION: Allergies as of 08/17/2022       Reactions   Penicillins Hives   Percocet [oxycodone-acetaminophen] Nausea Only, Other (See Comments)   Pt states the pill is hard to swallow        Medication List     STOP taking these  medications    methylPREDNISolone 4 MG Tbpk tablet Commonly known as: MEDROL DOSEPAK       TAKE these medications    acetaminophen 500 MG tablet Commonly known as: TYLENOL Take 1,000 mg by  mouth every 6 (six) hours as needed for mild pain or moderate pain.   atorvastatin 40 MG tablet Commonly known as: LIPITOR Take 1 tablet (40 mg total) by mouth daily. Start taking on: August 18, 2022   blood glucose meter kit and supplies Kit Dispense based on patient and insurance preference. Use up to four times daily as directed.   cyclobenzaprine 10 MG tablet Commonly known as: FLEXERIL Take 1 tablet (10 mg total) by mouth 2 (two) times daily as needed for up to 20 doses for muscle spasms.   docusate sodium 100 MG capsule Commonly known as: COLACE Take 1 capsule (100 mg total) by mouth 2 (two) times daily.   escitalopram 20 MG tablet Commonly known as: LEXAPRO Take 1 tablet (20 mg total) by mouth daily. Start taking on: August 18, 2022   gabapentin 300 MG capsule Commonly known as: NEURONTIN Take 1 capsule (300 mg total) by mouth 3 (three) times daily.   Insulin Pen Needle 32G X 4 MM Misc Use with Insulin pen   ipratropium-albuterol 0.5-2.5 (3) MG/3ML Soln Commonly known as: DUONEB Take 3 mLs by nebulization every 6 (six) hours as needed (Shortness of breath).   NovoLOG Mix 70/30 FlexPen (70-30) 100 UNIT/ML FlexPen Generic drug: insulin aspart protamine - aspart Inject 10 Units into the skin 2 (two) times daily with a meal.   oxyCODONE 5 MG immediate release tablet Commonly known as: Roxicodone Take 1 tablet (5 mg total) by mouth every 6 (six) hours as needed (For Cancer Pain in the Spine).   polyethylene glycol powder 17 GM/SCOOP powder Commonly known as: GLYCOLAX/MIRALAX Take 1 capful  (17 g) with water by mouth daily as needed.               Durable Medical Equipment  (From admission, onward)           Start     Ordered   08/17/22 1623  For home use only DME Nebulizer machine  Once       Question Answer Comment  Patient needs a nebulizer to treat with the following condition Lung cancer ([place])   Length of Need Lifetime      08/17/22 1622    08/16/22 1027  For home use only DME Walker rolling  Once       Question Answer Comment  Walker: With 5 Inch Wheels   Patient needs a walker to treat with the following condition Metastatic cancer ([place])      08/16/22 1026            Follow-up Information     [facility] [place]. Schedule an appointment as soon as possible for a visit.   Specialty: Home Health Services Why: Call [facility] on Monday and provide them your new insurance information for therapy services. Contact information: [address] [place] [address] [phone]         Transportation services Follow up.   Contact information: [phone]        Curt Bears, MD. Schedule an appointment as soon as possible for a visit in 1 week(s).   Specialty: Oncology Contact information: [place] [address] [phone]  Discharge Exam: Filed Weights   08/06/22 0427 08/09/22 1600 08/13/22 0500  Weight: 27.2 kg 58.7 kg 56 kg   General-appears in no acute distress Heart-S1-S2, regular, no murmur auscultated Lungs-clear to auscultation bilaterally, no wheezing or crackles auscultated Abdomen-soft, nontender, no organomegaly Extremities-no edema in the lower extremities Neuro-alert, oriented x3, no focal deficit noted  Condition at discharge: good  The results of significant diagnostics from this hospitalization (including imaging, microbiology, ancillary and laboratory) are listed below for reference.   Imaging Studies: NM PET Image Initial (PI) Skull Base To Thigh  Addendum Date: 08/10/2022   ADDENDUM REPORT: 08/10/2022 12:19 ADDENDUM: Thoracic MRI 07/29/2022 questioned a small metastasis inferiorly in the T9 vertebral body. There is no abnormal hypermetabolic activity in this area or the additional bones outside of the lumbar spine on this PET-CT. Electronically Signed   By: Richardean Sale M.D.   On: 08/10/2022 12:19   Result  Date: 08/10/2022 CLINICAL DATA:  Initial treatment strategy for osseous metastatic disease. Adenocarcinoma on recent L2 biopsy. EXAM: NUCLEAR MEDICINE PET SKULL BASE TO THIGH TECHNIQUE: 5.32 mCi F-18 FDG was injected intravenously. Full-ring PET imaging was performed from the skull base to thigh after the radiotracer. CT data was obtained and used for attenuation correction and anatomic localization. Fasting blood glucose: 81 mg/dl COMPARISON:  MRI of the thoracolumbar spine 07/29/2022, chest CT 06/28/2022 and abdominopelvic CT 04/04/2022 and 05/05/2022. FINDINGS: Mediastinal blood pool activity: SUV max 0.9 NECK: No hypermetabolic cervical lymph nodes are identified.Fairly symmetric activity within the lymphoid tissue of Waldeyer's ring is within physiologic limits. No suspicious activity identified within the pharyngeal mucosal space. Incidental CT findings: none CHEST: There are multiple hypermetabolic mediastinal and right hilar lymph nodes. There is a high right paratracheal node with an SUV max of 8.9, a subcarinal node with an SUV max of 10.9 and a right hilar node with an SUV max of 13.6. No hypermetabolic axillary or left hilar adenopathy. Pulmonary assessment is limited by interval development of extensive ground-glass opacities in both lungs, demonstrating an upper lobe predominance and moderate bilateral pleural effusions. These pulmonary opacities are hypermetabolic (SUV max 8.2 in the right upper lobe), likely infectious. There is a hypermetabolic solid nodule medially in the right upper lobe measuring 1.1 cm on image 30/7 (SUV max 6.2). There is a hypermetabolic nodule medially in the right lower lobe measuring 1.3 x 0.9 cm on image 36/7 (SUV max 5.1). Incidental CT findings: As above, since previous chest CT of 5 weeks ago, the patient has developed diffuse ground-glass opacities in both lungs with associated hypermetabolic activity. There are moderate bilateral pleural effusions with resulting  compressive atelectasis in both lungs. Mild aortic atherosclerosis. ABDOMEN/PELVIS: There is no hypermetabolic activity within the liver, adrenal glands, spleen or pancreas. Mild hypermetabolic activity in the gastrohepatic ligament (SUV max 4.9) which may correspond with proximal stomach or a small gastrohepatic ligament node. No other hypermetabolic or enlarged abdominopelvic lymph nodes are identified. No other suspicious bowel activity. Incidental CT findings: Limited by paucity of intra-abdominal fat. Aortic and branch vessel atherosclerosis with probable small nonobstructing right renal calculi. SKELETON: There is intense hypermetabolic activity associated with the known osseous metastatic disease within the lumbar spine. Patient has undergone interval L2 spinal augmentation. There is hypermetabolic activity in the vertebral body and posterior elements (SUV max 11.3). There is additional hypermetabolic metastatic disease within the L3 vertebral body and posterior elements at L4. No definite osseous metastases outside of the lumbar spine (see thoracic spine MRI report). Incidental  CT findings: Cannot exclude residual significant mass effect on the spinal canal by the pathologic fracture at L2 (assessment limited by beam hardening artifact from the methylmethacrylate). IMPRESSION: 1. Known osseous metastatic disease in the lumbar spine is significantly hypermetabolic. Cannot exclude residual spinal stenosis at the L2 pathologic fracture post spinal augmentation. 2. Interval development of widespread ground-glass opacities throughout both lungs with moderate bilateral pleural effusions, most likely reflecting viral infection or drug reaction. Correlate clinically. 3. These pulmonary findings limit assessment of the lungs, but there are at least 2 hypermetabolic right pulmonary nodules as well as hypermetabolic mediastinal and right hilar adenopathy which may relate to metastatic bronchogenic carcinoma. 4.  Hypermetabolic activity in the gastrohepatic ligament, likely related to a small lymph node. No evidence of metastatic disease within the liver or adrenal glands. Electronically Signed: By: Richardean Sale M.D. On: 08/04/2022 14:29   MR BRAIN W WO CONTRAST  Addendum Date: 08/07/2022   ADDENDUM REPORT: 08/07/2022 14:05 ADDENDUM: A THIRD small right occipital pole enhancing metastasis is identified on series 100, image 59 and series 9, image 47. This lesion is 4 mm diameter and there is mild regional edema on series 4, image 22. This will be discussed at [place] on 08/08/2022. Electronically Signed   By: Genevie Ann M.D.   On: 08/07/2022 14:05   Result Date: 08/07/2022 CLINICAL DATA:  Non-small cell lung carcinoma staging EXAM: MRI HEAD WITHOUT AND WITH CONTRAST TECHNIQUE: Multiplanar, multiecho pulse sequences of the brain and surrounding structures were obtained without and with intravenous contrast. CONTRAST:  4.8 mL gadopiclenol (Vueway) 0.5 mmol/ml solution COMPARISON:  None Available. FINDINGS: Brain: No acute infarct, mass effect or extra-axial collection. Single right cerebellar chronic microhemorrhage. Normal white matter signal, parenchymal volume and CSF spaces. The midline structures are normal. There are punctate contrast enhancing lesions of the right cerebellum and left temporal lobe (series 1100, images 117 and 147). Vascular: Major flow voids are preserved. Skull and upper cervical spine: Normal calvarium and skull base. Visualized upper cervical spine and soft tissues are normal. Sinuses/Orbits:No paranasal sinus fluid levels or advanced mucosal thickening. No mastoid or middle ear effusion. Normal orbits. IMPRESSION: Punctate contrast enhancing lesions of the right cerebellum and left temporal lobe, consistent with metastatic disease. Electronically Signed: By: Ulyses Jarred M.D. On: 08/05/2022 01:28   DG FEMUR MIN 2 VIEWS LEFT  Result Date:  08/05/2022 CLINICAL DATA:  Metastatic carcinoma, pain left thigh EXAM: LEFT FEMUR 2 VIEWS COMPARISON:  None Available. FINDINGS: No fracture or dislocation is seen. There are no aggressive appearing focal lytic or sclerotic lesions. Small sclerotic density in neck of left femur may suggest benign bone island. In the PET-CT done on 08/04/2022, there was no hypermetabolic activity in the proximal left femur. IMPRESSION: No fracture or dislocation is seen. There is no evidence of any aggressive appearing lytic or sclerotic lesions. Electronically Signed   By: Elmer Picker M.D.   On: 08/05/2022 14:49   IR KYPHO LUMBAR INC FX REDUCE BONE BX UNI/BIL CANNULATION INC/IMAGING  Result Date: 08/01/2022 INDICATION: Samuel [name] is a 56 y.o. male with a past medical history significant for pancreatitis and diabetes mellitus. Patient reports back pain and weight loss for approximately 4 months. He presented to ED in May due to back pain and a CT of the abdomen showed a lytic L2 vertebral body lesion as well as an L4 spinous process lesion without intra-abdominal malignancy. A chest CT performed June 28, 2022 showed no definitive  evidence of malignancy. Due to worsening back pain, patient again presented to ED on 07/17/2022. CT of the lumbar spine showed worsening presumed pathologic fracture of the L2 vertebral body with small retropulsion, extension into the posterior elements and more than 50% height loss, new lytic lesion in the L3 right pedicle and progression of the lytic lesion in the L4 spinous process, now involving the lamina bilaterally. Patient has persistent back pain severely limiting quality of life. During office visit on 07/22/2022, he rated the pain 12/10 when he wakes up with some improvement after he takes medication when it lowers to a more tolerable degree, with worsening to 7-8/10 when moving. The pain radiates to the right leg and severely limits his ability to walk. On 07/29/2022, he  presented again to ED and was admitted due to worsening pain and weakness. He comes to our service today for fluoroscopy guided L2 core biopsy, RFA and kyphoplasty. EXAM: FLUOROSCOPY GUIDED L2 CORE BONE BIOPSY, RADIOFREQUENCY ABLATION AND BALLOON KYPHOPLASTY COMPARISON:  MRI of the lumbar spine September 2023. MEDICATIONS: Vancomycin 1 g IV; The antibiotic was administered in an appropriate time interval prior to needle puncture of the skin. ANESTHESIA/SEDATION: Total intra-service Moderate Sedation Time: 60 minutes. A total of 2.5 mg of Versed IV and 150 mcg of fentanyl IV were administered by the Radiology nurse monitored the patients level of consciousness and vital signs continuously throughout the procedure under my direct supervision. FLUOROSCOPY: Radiation Exposure Index (as provided by the fluoroscopic device): 1,610 mGy Kerma COMPLICATIONS: None immediate. TECHNIQUE: Informed written consent was obtained from the patient after a thorough discussion of the procedural risks, benefits and alternatives. All questions were addressed. Maximal Sterile Barrier Technique was utilized including caps, mask, sterile gowns, sterile gloves, sterile drape, hand hygiene and skin antiseptic. A timeout was performed prior to the initiation of the procedure. The patient was placed in prone position on the angiography table. The lumbar region was prepped and draped in a sterile fashion. Under fluoroscopy, the L2 vertebral body was delineated and the skin area was marked. The skin was infiltrated with a 1% Lidocaine approximately 5 cm lateral to the spinous process projection on the right. Using a 22-gauge spinal needle, the soft issue and the peripedicular space and periosteum were infiltrated with Bupivacaine 0.5%. A skin incision was made at the access site. Subsequently, an 11-gauge Kyphon trocar was inserted under fluoroscopic guidance until contact with the pedicle was obtained. The trocar was inserted into the pedicle  with light hammer tapping until the posterior boundaries of the vertebral body was reached. The diamond mandrill was removed and one core biopsy was obtained. The skin was infiltrated with a 1% Lidocaine approximately 4 cm lateral to the spinous process projection on the left. Using a 22-gauge spinal needle, the soft issue and the peripedicular space and periosteum were infiltrated with Bupivacaine 0.5%. A skin incision was made at the access site. Subsequently, an 11-gauge Kyphon trocar was inserted under fluoroscopic guidance until contact with the pedicle was obtained. The trocar was inserted with light hammer tapping into the pedicle until the posterior boundaries of the vertebral body was reached. A bone drill was coaxially advanced within the anterior third of the vertebral body on each side for proper RF probes size selection. An OsteoCool RF probe was inserted bilaterally within the mid-posterior third of the vertebral body. The radiofrequency ablation cycle was performed. Thereafter, the RF probes were exchanged for inflatable Kyphon balloons, which were centered within the mid-aspect of the vertebral  body. The balloons were inflated to create a void to serve as a repository for the bone cement. Both balloons were deflated and through both cannulas, under continuous fluoroscopy guidance in the AP and lateral views, the vertebral body was filled with previously mixed polymethyl-methacrylate (PMMA) added to barium for opacification. Both cannulas were later removed. Post procedural radiographed shows evidence of significant cement extravasation. FINDINGS: Pathologic compression fracture of the L2 vertebral body with compromise of the right lateral wall and right pedicle. IMPRESSION: Successful L2 core biopsy followed by radiofrequency ablation and balloon kyphoplasty. Electronically Signed   By: Pedro Earls M.D.   On: 08/01/2022 14:48   IR Bone Tumor(s)RF Ablation  Result Date:  08/01/2022 INDICATION: Samuel [name] is a 56 y.o. male with a past medical history significant for pancreatitis and diabetes mellitus. Patient reports back pain and weight loss for approximately 4 months. He presented to ED in May due to back pain and a CT of the abdomen showed a lytic L2 vertebral body lesion as well as an L4 spinous process lesion without intra-abdominal malignancy. A chest CT performed June 28, 2022 showed no definitive evidence of malignancy. Due to worsening back pain, patient again presented to ED on 07/17/2022. CT of the lumbar spine showed worsening presumed pathologic fracture of the L2 vertebral body with small retropulsion, extension into the posterior elements and more than 50% height loss, new lytic lesion in the L3 right pedicle and progression of the lytic lesion in the L4 spinous process, now involving the lamina bilaterally. Patient has persistent back pain severely limiting quality of life. During office visit on 07/22/2022, he rated the pain 12/10 when he wakes up with some improvement after he takes medication when it lowers to a more tolerable degree, with worsening to 7-8/10 when moving. The pain radiates to the right leg and severely limits his ability to walk. On 07/29/2022, he presented again to ED and was admitted due to worsening pain and weakness. He comes to our service today for fluoroscopy guided L2 core biopsy, RFA and kyphoplasty. EXAM: FLUOROSCOPY GUIDED L2 CORE BONE BIOPSY, RADIOFREQUENCY ABLATION AND BALLOON KYPHOPLASTY COMPARISON:  MRI of the lumbar spine September 2023. MEDICATIONS: Vancomycin 1 g IV; The antibiotic was administered in an appropriate time interval prior to needle puncture of the skin. ANESTHESIA/SEDATION: Total intra-service Moderate Sedation Time: 60 minutes. A total of 2.5 mg of Versed IV and 150 mcg of fentanyl IV were administered by the Radiology nurse monitored the patients level of consciousness and vital signs continuously  throughout the procedure under my direct supervision. FLUOROSCOPY: Radiation Exposure Index (as provided by the fluoroscopic device): 9,794 mGy Kerma COMPLICATIONS: None immediate. TECHNIQUE: Informed written consent was obtained from the patient after a thorough discussion of the procedural risks, benefits and alternatives. All questions were addressed. Maximal Sterile Barrier Technique was utilized including caps, mask, sterile gowns, sterile gloves, sterile drape, hand hygiene and skin antiseptic. A timeout was performed prior to the initiation of the procedure. The patient was placed in prone position on the angiography table. The lumbar region was prepped and draped in a sterile fashion. Under fluoroscopy, the L2 vertebral body was delineated and the skin area was marked. The skin was infiltrated with a 1% Lidocaine approximately 5 cm lateral to the spinous process projection on the right. Using a 22-gauge spinal needle, the soft issue and the peripedicular space and periosteum were infiltrated with Bupivacaine 0.5%. A skin incision was made at the access site.  Subsequently, an 11-gauge Kyphon trocar was inserted under fluoroscopic guidance until contact with the pedicle was obtained. The trocar was inserted into the pedicle with light hammer tapping until the posterior boundaries of the vertebral body was reached. The diamond mandrill was removed and one core biopsy was obtained. The skin was infiltrated with a 1% Lidocaine approximately 4 cm lateral to the spinous process projection on the left. Using a 22-gauge spinal needle, the soft issue and the peripedicular space and periosteum were infiltrated with Bupivacaine 0.5%. A skin incision was made at the access site. Subsequently, an 11-gauge Kyphon trocar was inserted under fluoroscopic guidance until contact with the pedicle was obtained. The trocar was inserted with light hammer tapping into the pedicle until the posterior boundaries of the vertebral body  was reached. A bone drill was coaxially advanced within the anterior third of the vertebral body on each side for proper RF probes size selection. An OsteoCool RF probe was inserted bilaterally within the mid-posterior third of the vertebral body. The radiofrequency ablation cycle was performed. Thereafter, the RF probes were exchanged for inflatable Kyphon balloons, which were centered within the mid-aspect of the vertebral body. The balloons were inflated to create a void to serve as a repository for the bone cement. Both balloons were deflated and through both cannulas, under continuous fluoroscopy guidance in the AP and lateral views, the vertebral body was filled with previously mixed polymethyl-methacrylate (PMMA) added to barium for opacification. Both cannulas were later removed. Post procedural radiographed shows evidence of significant cement extravasation. FINDINGS: Pathologic compression fracture of the L2 vertebral body with compromise of the right lateral wall and right pedicle. IMPRESSION: Successful L2 core biopsy followed by radiofrequency ablation and balloon kyphoplasty. Electronically Signed   By: Pedro Earls M.D.   On: 08/01/2022 14:48   MR Lumbar Spine W Wo Contrast  Result Date: 07/29/2022 CLINICAL DATA:  Worsening low back pain EXAM: MRI THORACIC AND LUMBAR SPINE WITHOUT AND WITH CONTRAST TECHNIQUE: Multiplanar and multiecho pulse sequences of the thoracic and lumbar spine were obtained without and with intravenous contrast. CONTRAST:  6.68m GADAVIST GADOBUTROL 1 MMOL/ML IV SOLN COMPARISON:  MRI lumbar spine 04/04/2022 FINDINGS: MRI THORACIC SPINE FINDINGS Alignment:  No significant listhesis. Vertebrae: Focal area of T1 hypointense and T2 hyperintense signal with contrast enhancement in the posteroinferior aspect of T9 (series 25, image 7) is concerning for osseous metastatic disease. No acute fracture. Cord:  Normal signal and morphology.  No abnormal enhancement.  Paraspinal and other soft tissues: Negative. Disc levels: No spinal canal stenosis or significant neural foraminal narrowing. MRI LUMBAR SPINE FINDINGS Segmentation:  Standard. Alignment: Straightening of the normal lumbar lordosis. No significant listhesis. Vertebrae: Decreased T1 signal, increased T2 signal and abnormal enhancement in the L2 vertebral body and right-greater-than-left posterior elements (series 6, images 6, 8, and 11), with pathologic compression fracture that has progressed compared to 04/04/2022, with approximately 40% vertebral body height loss and 7 mm of retropulsion. Additional abnormal signal is noted in the right posterosuperior aspect of L3, extending into the right pedicle and proximal spinous process (series 6, images 5-8); L4 posterior elements (series 6, images 4-13); and inferoposterior left aspect of L5 (series 6, image 10). Vertebral body heights are otherwise preserved. Conus medullaris: Extends to the L1 level and appears normal. No abnormal enhancement. Paraspinal and other soft tissues: Increased T2 signal in the left paraspinous musculature at L4, which may be inflammatory. Disc levels: T12-L1: No significant disc bulge. No spinal canal stenosis  or neural foraminal narrowing. L1-L2: No significant disc bulge. No spinal canal stenosis at the disc space. No neural foraminal narrowing. Posterior to the L1 vertebral body, there is moderate spinal canal stenosis secondary to retropulsion. This likely compresses the descending right-greater-than-left L2 nerve roots. L2-L3: No significant disc bulge. No spinal canal stenosis. Mild left and moderate right neural foraminal narrowing, secondary to the vertebral body fracture and epidural extension of tumor. L3-L4: No significant disc bulge. No spinal canal stenosis or neural foraminal narrowing. L4-L5: Mild disc bulge. Narrowing of the left lateral recess, which could affect the descending left L5 nerve roots. Mild-to-moderate left  neural foraminal narrowing. L5-S1: Disc height loss with right paracentral, subarticular, and foraminal disc protrusion, which narrows the right lateral recess, likely affecting the descending right S1 nerve roots. No spinal canal stenosis. Mild right-greater-than-left neural foraminal narrowing. IMPRESSION: 1. Progression of the previously noted pathologic fracture at L2, with approximately 40% vertebral body height loss and 7 mm retropulsion causes moderate spinal canal stenosis posterior to the L2 vertebral body. This likely compresses the descending right-greater-than-left L2 nerve roots. This also causes moderate right and mild left neural foraminal narrowing at L2-L3. 2. Abnormal signal throughout the L2 vertebral body and right-greater-than-left posterior elements, most likely representing metastatic disease with myeloma or lymphoma felt to be less likely. Additional osseous lesions in T9, L3, L4 and L5. No additional pathologic fracture. 3. Increased T2 signal in the left paraspinous musculature at L4, which may be inflammatory. 4. No additional spinal canal stenosis. L4-L5 mild-to-moderate left neural foraminal narrowing and L5-S1 mild bilateral neural foraminal narrowing. Electronically Signed   By: Merilyn Baba M.D.   On: 07/29/2022 02:35   MR THORACIC SPINE W WO CONTRAST  Result Date: 07/29/2022 CLINICAL DATA:  Worsening low back pain EXAM: MRI THORACIC AND LUMBAR SPINE WITHOUT AND WITH CONTRAST TECHNIQUE: Multiplanar and multiecho pulse sequences of the thoracic and lumbar spine were obtained without and with intravenous contrast. CONTRAST:  6.25m GADAVIST GADOBUTROL 1 MMOL/ML IV SOLN COMPARISON:  MRI lumbar spine 04/04/2022 FINDINGS: MRI THORACIC SPINE FINDINGS Alignment:  No significant listhesis. Vertebrae: Focal area of T1 hypointense and T2 hyperintense signal with contrast enhancement in the posteroinferior aspect of T9 (series 25, image 7) is concerning for osseous metastatic disease. No  acute fracture. Cord:  Normal signal and morphology.  No abnormal enhancement. Paraspinal and other soft tissues: Negative. Disc levels: No spinal canal stenosis or significant neural foraminal narrowing. MRI LUMBAR SPINE FINDINGS Segmentation:  Standard. Alignment: Straightening of the normal lumbar lordosis. No significant listhesis. Vertebrae: Decreased T1 signal, increased T2 signal and abnormal enhancement in the L2 vertebral body and right-greater-than-left posterior elements (series 6, images 6, 8, and 11), with pathologic compression fracture that has progressed compared to 04/04/2022, with approximately 40% vertebral body height loss and 7 mm of retropulsion. Additional abnormal signal is noted in the right posterosuperior aspect of L3, extending into the right pedicle and proximal spinous process (series 6, images 5-8); L4 posterior elements (series 6, images 4-13); and inferoposterior left aspect of L5 (series 6, image 10). Vertebral body heights are otherwise preserved. Conus medullaris: Extends to the L1 level and appears normal. No abnormal enhancement. Paraspinal and other soft tissues: Increased T2 signal in the left paraspinous musculature at L4, which may be inflammatory. Disc levels: T12-L1: No significant disc bulge. No spinal canal stenosis or neural foraminal narrowing. L1-L2: No significant disc bulge. No spinal canal stenosis at the disc space. No neural foraminal narrowing. Posterior to  the L1 vertebral body, there is moderate spinal canal stenosis secondary to retropulsion. This likely compresses the descending right-greater-than-left L2 nerve roots. L2-L3: No significant disc bulge. No spinal canal stenosis. Mild left and moderate right neural foraminal narrowing, secondary to the vertebral body fracture and epidural extension of tumor. L3-L4: No significant disc bulge. No spinal canal stenosis or neural foraminal narrowing. L4-L5: Mild disc bulge. Narrowing of the left lateral recess,  which could affect the descending left L5 nerve roots. Mild-to-moderate left neural foraminal narrowing. L5-S1: Disc height loss with right paracentral, subarticular, and foraminal disc protrusion, which narrows the right lateral recess, likely affecting the descending right S1 nerve roots. No spinal canal stenosis. Mild right-greater-than-left neural foraminal narrowing. IMPRESSION: 1. Progression of the previously noted pathologic fracture at L2, with approximately 40% vertebral body height loss and 7 mm retropulsion causes moderate spinal canal stenosis posterior to the L2 vertebral body. This likely compresses the descending right-greater-than-left L2 nerve roots. This also causes moderate right and mild left neural foraminal narrowing at L2-L3. 2. Abnormal signal throughout the L2 vertebral body and right-greater-than-left posterior elements, most likely representing metastatic disease with myeloma or lymphoma felt to be less likely. Additional osseous lesions in T9, L3, L4 and L5. No additional pathologic fracture. 3. Increased T2 signal in the left paraspinous musculature at L4, which may be inflammatory. 4. No additional spinal canal stenosis. L4-L5 mild-to-moderate left neural foraminal narrowing and L5-S1 mild bilateral neural foraminal narrowing. Electronically Signed   By: Merilyn Baba M.D.   On: 07/29/2022 02:35   DG Chest 2 View  Result Date: 07/28/2022 CLINICAL DATA:  Weakness EXAM: CHEST - 2 VIEW COMPARISON:  03/09/2021 FINDINGS: The heart size and mediastinal contours are within normal limits. Aortic atherosclerosis. Both lungs are clear. Multiple old right-sided rib fractures. 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 IMPRESSION: No active cardiopulmonary disease. Electronically Signed   By: Donavan Foil M.D.   On: 07/28/2022 20:35    Microbiology: Results for orders placed or performed during the hospital encounter of 03/09/21  Resp Panel by RT-PCR (Flu A&B, Covid) Nasopharyngeal Swab      Status: None   Collection Time: 03/09/21  1:20 PM   Specimen: Nasopharyngeal Swab; Nasopharyngeal(NP) swabs in vial transport medium  Result Value Ref Range Status   SARS Coronavirus 2 by RT PCR NEGATIVE NEGATIVE Final    Comment: (NOTE) SARS-CoV-2 target nucleic acids are NOT DETECTED.  The SARS-CoV-2 RNA is generally detectable in upper respiratory specimens during the acute phase of infection. The lowest concentration of SARS-CoV-2 viral copies this assay can detect is 138 copies/mL. A negative result does not preclude SARS-Cov-2 infection and should not be used as the sole basis for treatment or other patient management decisions. A negative result may occur with  improper specimen collection/handling, submission of specimen other than nasopharyngeal swab, presence of viral mutation(s) within the areas targeted by this assay, and inadequate number of viral copies(<138 copies/mL). A negative result must be combined with clinical observations, patient history, and epidemiological information. The expected result is Negative.  Fact Sheet for Patients:  EntrepreneurPulse.com.au  Fact Sheet for Healthcare Providers:  IncredibleEmployment.be  This test is no t yet approved or cleared by the [place] FDA and  has been authorized for detection and/or diagnosis of SARS-CoV-2 by FDA under an Emergency Use Authorization (EUA). This EUA will remain  in effect (meaning this test can be used) for the duration of the COVID-19 declaration under Section 564(b)(1) of the Act, 21 U.S.C.section  360bbb-3(b)(1), unless the authorization is terminated  or revoked sooner.       Influenza A by PCR NEGATIVE NEGATIVE Final   Influenza B by PCR NEGATIVE NEGATIVE Final    Comment: (NOTE) The Xpert Xpress SARS-CoV-2/FLU/RSV plus assay is intended as an aid in the diagnosis of influenza from Nasopharyngeal swab specimens and should not be used as a sole basis  for treatment. Nasal washings and aspirates are unacceptable for Xpert Xpress SARS-CoV-2/FLU/RSV testing.  Fact Sheet for Patients: EntrepreneurPulse.com.au  Fact Sheet for Healthcare Providers: IncredibleEmployment.be  This test is not yet approved or cleared by the [place] FDA and has been authorized for detection and/or diagnosis of SARS-CoV-2 by FDA under an Emergency Use Authorization (EUA). This EUA will remain in effect (meaning this test can be used) for the duration of the COVID-19 declaration under Section 564(b)(1) of the Act, 21 U.S.C. section 360bbb-3(b)(1), unless the authorization is terminated or revoked.  Performed at [facility]     Labs: CBC: Recent Labs  Lab 08/13/22 0904 08/14/22 0725 08/15/22 0853 08/16/22 0414 08/17/22 0716  WBC 9.4 8.2 10.0 9.0 7.7  HGB 11.1* 11.0* 11.2* 10.9* 10.5*  HCT 32.8* 33.8* 34.0* 32.1* 31.1*  MCV 94.0 96.0 95.5 93.6 94.8  PLT 325 354 367 348 601   Basic Metabolic Panel: Recent Labs  Lab 08/13/22 0904 08/14/22 0725 08/15/22 0853 08/16/22 0414 08/17/22 0716  NA 137 139 138 137 137  K 4.3 4.0 4.5 4.2 3.9  CL 100 103 102 101 104  CO2 '27 29 30 30 29  ' GLUCOSE 180* 65* 146* 101* 129*  BUN '15 13 15 16 17  ' CREATININE 0.51* 0.43* 0.53* 0.64 0.54*  CALCIUM 9.5 9.5 9.6 9.2 8.9  MG  --  2.0 1.8 1.8 1.7   Liver Function Tests: Recent Labs  Lab 08/13/22 0904  AST 16  ALT 20  ALKPHOS 62  BILITOT 0.2*  PROT 6.9  ALBUMIN 2.6*   CBG: Recent Labs  Lab 08/16/22 0620 08/16/22 1206 08/16/22 1710 08/16/22 2114 08/17/22 1217  GLUCAP 73 92 313* 181* 69*    Discharge time spent: greater than 30 minutes.  Signed: Oswald Hillock, MD [facility] 08/17/2022

## 2022-08-18 ENCOUNTER — Other Ambulatory Visit: Payer: Self-pay

## 2022-08-18 ENCOUNTER — Other Ambulatory Visit: Payer: Self-pay | Admitting: Radiation Therapy

## 2022-08-18 ENCOUNTER — Ambulatory Visit
Admission: RE | Admit: 2022-08-18 | Discharge: 2022-08-18 | Disposition: A | Discharge status: Home / Self Care | Payer: BLUE CROSS/BLUE SHIELD | Source: Ambulatory Visit | Attending: Radiation Oncology | Admitting: Radiation Oncology

## 2022-08-18 ENCOUNTER — Other Ambulatory Visit: Payer: Self-pay | Admitting: Urology

## 2022-08-18 ENCOUNTER — Other Ambulatory Visit (HOSPITAL_COMMUNITY): Payer: Self-pay

## 2022-08-18 ENCOUNTER — Telehealth: Payer: Self-pay | Admitting: Radiation Therapy

## 2022-08-18 DIAGNOSIS — C7951 Secondary malignant neoplasm of bone: Secondary | ICD-10-CM

## 2022-08-18 DIAGNOSIS — Z510 Encounter for antineoplastic radiation therapy: Secondary | ICD-10-CM | POA: Diagnosis present

## 2022-08-18 DIAGNOSIS — C7931 Secondary malignant neoplasm of brain: Secondary | ICD-10-CM | POA: Diagnosis not present

## 2022-08-18 DIAGNOSIS — C3490 Malignant neoplasm of unspecified part of unspecified bronchus or lung: Secondary | ICD-10-CM | POA: Diagnosis not present

## 2022-08-18 LAB — RAD ONC ARIA SESSION SUMMARY
Course Elapsed Days: 1
Plan Fractions Treated to Date: 2
Plan Prescribed Dose Per Fraction: 3 Gy
Plan Total Fractions Prescribed: 10
Plan Total Prescribed Dose: 30 Gy
Reference Point Dosage Given to Date: 6 Gy
Reference Point Session Dosage Given: 3 Gy
Session Number: 2

## 2022-08-18 MED ORDER — INSULIN LISPRO PROT & LISPRO (75-25 MIX) 100 UNIT/ML KWIKPEN
PEN_INJECTOR | SUBCUTANEOUS | 5 refills | Status: DC
  Filled 2022-08-18: qty 6, 30d supply, fill #0

## 2022-08-18 MED ORDER — OXYCODONE HCL 5 MG PO TABS: 5.0000 mg | ORAL_TABLET | Freq: Four times a day (QID) | ORAL | 0 refills | Status: DC | PRN

## 2022-08-18 NOTE — Telephone Encounter (Addendum)
[name]  called requesting information about who will be continuing his pain medication management now that he has been discharged from the hospital. He was discharged with 20 pills (5 days) of medication. Dr. Johny Shears PA, Freeman Caldron, is entering in a refill for 30 pills to pick up on 10/1 and a referral has been placed for palliative care to see Samuel [name] to help manage his symptoms and pain medication needs.   Samuel [name] is happy with this plan and thankful for the team's action to his needs.  Mont Dutton R.T.(R)(T) Radiation Special Procedures Navigator

## 2022-08-18 NOTE — Progress Notes (Signed)
  Radiation Oncology         [phone]) [phone] ________________________________  Stereotactic Treatment Procedure Note  Name: Samuel [name] MRN: [id number]  Date: 08/17/2022  DOB: 05-28-1966  SPECIAL TREATMENT PROCEDURE    ICD-10-CM   1. Metastatic cancer to brain [facility])  C79.31       3D TREATMENT PLANNING AND DOSIMETRY:  The patient's radiation plan was reviewed and approved by neurosurgery and radiation oncology prior to treatment.  It showed 3-dimensional radiation distributions overlaid onto the planning CT/MRI image set.  The [facility] for the target structures as well as the organs at risk were reviewed. The documentation of the 3D plan and dosimetry are filed in the radiation oncology EMR.  NARRATIVE:  Samuel [name] was brought to the TrueBeam stereotactic radiation treatment machine and placed supine on the CT couch. The head frame was applied, and the patient was set up for stereotactic radiosurgery.  Neurosurgery was present for the set-up and delivery  SIMULATION VERIFICATION:  In the couch zero-angle position, the patient underwent Exactrac imaging using the Brainlab system with orthogonal KV images.  These were carefully aligned and repeated to confirm treatment position for each of the isocenters.  The Exactrac snap film verification was repeated at each couch angle.  PROCEDURE: Samuel [name] received stereotactic radiosurgery to the following targets: Three targets (Right cerebellar 2 mm, Left Temporal 3 mm, Right Occipital 4 mm) were treated using a single isocenter using 6 Rapid Arc VMAT Beams to a prescription dose of 20 Gy.  ExacTrac registration was performed for each couch angle.  The 100% isodose line was prescribed.  6 MV X-rays were delivered in the flattening filter free beam mode.  STEREOTACTIC TREATMENT MANAGEMENT:  Following delivery, the patient was transported to nursing in stable condition and monitored for possible acute effects.  Vital signs were recorded  . The patient tolerated treatment without significant acute effects, and was discharged to home in stable condition.    PLAN: Follow-up in one month.  ________________________________  Sheral Apley. Tammi Klippel, M.D.

## 2022-08-19 ENCOUNTER — Ambulatory Visit
Admission: RE | Admit: 2022-08-19 | Discharge: 2022-08-19 | Disposition: A | Discharge status: Home / Self Care | Payer: BLUE CROSS/BLUE SHIELD | Source: Ambulatory Visit | Attending: Radiation Oncology | Admitting: Radiation Oncology

## 2022-08-19 ENCOUNTER — Telehealth: Payer: Self-pay | Admitting: Radiation Therapy

## 2022-08-19 ENCOUNTER — Other Ambulatory Visit: Payer: Self-pay

## 2022-08-19 DIAGNOSIS — Z510 Encounter for antineoplastic radiation therapy: Secondary | ICD-10-CM | POA: Diagnosis not present

## 2022-08-19 LAB — RAD ONC ARIA SESSION SUMMARY
Course Elapsed Days: 2
Plan Fractions Treated to Date: 3
Plan Prescribed Dose Per Fraction: 3 Gy
Plan Total Fractions Prescribed: 10
Plan Total Prescribed Dose: 30 Gy
Reference Point Dosage Given to Date: 9 Gy
Reference Point Session Dosage Given: 3 Gy
Session Number: 3

## 2022-08-19 NOTE — Op Note (Signed)
  Name: Samuel [name]  MRN: [id number]  Date: 08/17/2022   DOB: [birth year]  Stereotactic Radiosurgery Operative Note  PRE-OPERATIVE DIAGNOSIS:  Multiple Brain Metastases with lung adenocarcinoma  POST-OPERATIVE DIAGNOSIS:  Same  PROCEDURE:  Stereotactic Radiosurgery  SURGEON:  Jairo Ben, MD  NARRATIVE: The patient underwent a radiation treatment planning session in the radiation oncology simulation suite under the care of the radiation oncology physician and physicist.  I participated closely in the radiation treatment planning afterwards. The patient underwent planning CT which was fused to 3T high resolution MRI with 1 mm axial slices.  These images were fused on the planning system.  We contoured the gross target volumes and subsequently expanded this to yield the Planning Target Volume. I actively participated in the planning process.  I helped to define and review the target contours and also the contours of the optic pathway, eyes, brainstem and selected nearby organs at risk.  All the dose constraints for critical structures were reviewed and compared to AAPM Task Group 101.  The prescription dose conformity was reviewed.  I approved the plan electronically.    Accordingly, Samuel [name] was brought to the TrueBeam stereotactic radiation treatment linac and placed in the custom immobilization mask.  The patient was aligned according to the IR fiducial markers with BrainLab Exactrac, then orthogonal x-rays were used in ExacTrac with the 6DOF robotic table and the shifts were made to align the patient  Samuel [name] received stereotactic radiosurgery uneventfully.    Lesions treated:  3   Complex lesions treated:  0 (>3.5 cm, <24mm of optic path, or within the brainstem)  Right cerebellar 20 Gy Left temporal 20Gy Right occipital 20Gy  The detailed description of the procedure is recorded in the radiation oncology procedure note.  I was present for the duration of  the procedure.  DISPOSITION:  Following delivery, the patient was transported to nursing in stable condition and monitored for possible acute effects to be discharged to home in stable condition with follow-up in one month.  Jairo Ben, MD 08/19/2022 2:14 PM

## 2022-08-19 NOTE — Addendum Note (Signed)
Encounter addended by: Consuella Lose, MD on: 08/19/2022 2:16 PM  Actions taken: Clinical Note Signed

## 2022-08-19 NOTE — Telephone Encounter (Addendum)
Samuel [name] called to say that his pain has worsened since the start of his spinal radiation course. He has been taking the pain medication as prescribed and asking for recommendations. Per Dr. Tammi Klippel, the pain medication should be offering relief. Samuel [name] is scheduled for his third radiation treatment this afternoon. He has been instructed to continue as scheduled and that they could add aleve or Ibuprofen as an added anti-inflammatory if necessary. I have encouraged Samuel [name] to eat when he takes his medication. He is in agreement with this plan.   Mont Dutton R.T.(R)(T) Radiation Special Procedures Navigator

## 2022-08-22 ENCOUNTER — Other Ambulatory Visit: Payer: Self-pay

## 2022-08-22 ENCOUNTER — Ambulatory Visit
Admission: RE | Admit: 2022-08-22 | Discharge: 2022-08-22 | Disposition: A | Discharge status: Home / Self Care | Payer: BLUE CROSS/BLUE SHIELD | Source: Ambulatory Visit | Attending: Radiation Oncology | Admitting: Radiation Oncology

## 2022-08-22 DIAGNOSIS — C7931 Secondary malignant neoplasm of brain: Secondary | ICD-10-CM | POA: Diagnosis not present

## 2022-08-22 DIAGNOSIS — Z510 Encounter for antineoplastic radiation therapy: Secondary | ICD-10-CM | POA: Diagnosis present

## 2022-08-22 DIAGNOSIS — C3490 Malignant neoplasm of unspecified part of unspecified bronchus or lung: Secondary | ICD-10-CM | POA: Diagnosis not present

## 2022-08-22 LAB — RAD ONC ARIA SESSION SUMMARY
Course Elapsed Days: 5
Plan Fractions Treated to Date: 4
Plan Prescribed Dose Per Fraction: 3 Gy
Plan Total Fractions Prescribed: 10
Plan Total Prescribed Dose: 30 Gy
Reference Point Dosage Given to Date: 12 Gy
Reference Point Session Dosage Given: 3 Gy
Session Number: 4

## 2022-08-23 ENCOUNTER — Ambulatory Visit
Admission: RE | Admit: 2022-08-23 | Discharge: 2022-08-23 | Disposition: A | Discharge status: Home / Self Care | Payer: BLUE CROSS/BLUE SHIELD | Source: Ambulatory Visit | Attending: Radiation Oncology | Admitting: Radiation Oncology

## 2022-08-23 ENCOUNTER — Other Ambulatory Visit: Payer: Self-pay

## 2022-08-23 DIAGNOSIS — Z510 Encounter for antineoplastic radiation therapy: Secondary | ICD-10-CM | POA: Diagnosis not present

## 2022-08-23 LAB — RAD ONC ARIA SESSION SUMMARY
Course Elapsed Days: 6
Plan Fractions Treated to Date: 5
Plan Prescribed Dose Per Fraction: 3 Gy
Plan Total Fractions Prescribed: 10
Plan Total Prescribed Dose: 30 Gy
Reference Point Dosage Given to Date: 15 Gy
Reference Point Session Dosage Given: 3 Gy
Session Number: 5

## 2022-08-24 ENCOUNTER — Other Ambulatory Visit: Payer: Self-pay

## 2022-08-24 ENCOUNTER — Ambulatory Visit
Admission: RE | Admit: 2022-08-24 | Discharge: 2022-08-24 | Disposition: A | Discharge status: Home / Self Care | Payer: BLUE CROSS/BLUE SHIELD | Source: Ambulatory Visit | Attending: Radiation Oncology | Admitting: Radiation Oncology

## 2022-08-24 DIAGNOSIS — Z510 Encounter for antineoplastic radiation therapy: Secondary | ICD-10-CM | POA: Diagnosis not present

## 2022-08-24 LAB — RAD ONC ARIA SESSION SUMMARY
Course Elapsed Days: 7
Plan Fractions Treated to Date: 6
Plan Prescribed Dose Per Fraction: 3 Gy
Plan Total Fractions Prescribed: 10
Plan Total Prescribed Dose: 30 Gy
Reference Point Dosage Given to Date: 18 Gy
Reference Point Session Dosage Given: 3 Gy
Session Number: 6

## 2022-08-25 ENCOUNTER — Other Ambulatory Visit: Payer: Self-pay

## 2022-08-25 ENCOUNTER — Ambulatory Visit
Admission: RE | Admit: 2022-08-25 | Discharge: 2022-08-25 | Disposition: A | Discharge status: Home / Self Care | Payer: BLUE CROSS/BLUE SHIELD | Source: Ambulatory Visit | Attending: Radiation Oncology | Admitting: Radiation Oncology

## 2022-08-25 DIAGNOSIS — Z510 Encounter for antineoplastic radiation therapy: Secondary | ICD-10-CM | POA: Diagnosis not present

## 2022-08-25 LAB — RAD ONC ARIA SESSION SUMMARY
Course Elapsed Days: 8
Plan Fractions Treated to Date: 7
Plan Prescribed Dose Per Fraction: 3 Gy
Plan Total Fractions Prescribed: 10
Plan Total Prescribed Dose: 30 Gy
Reference Point Dosage Given to Date: 21 Gy
Reference Point Session Dosage Given: 3 Gy
Session Number: 7

## 2022-08-26 ENCOUNTER — Ambulatory Visit
Admission: RE | Admit: 2022-08-26 | Discharge: 2022-08-26 | Disposition: A | Discharge status: Home / Self Care | Payer: BLUE CROSS/BLUE SHIELD | Source: Ambulatory Visit | Attending: Radiation Oncology | Admitting: Radiation Oncology

## 2022-08-26 ENCOUNTER — Other Ambulatory Visit: Payer: Self-pay

## 2022-08-26 DIAGNOSIS — Z510 Encounter for antineoplastic radiation therapy: Secondary | ICD-10-CM | POA: Diagnosis not present

## 2022-08-26 LAB — RAD ONC ARIA SESSION SUMMARY
Course Elapsed Days: 9
Plan Fractions Treated to Date: 8
Plan Prescribed Dose Per Fraction: 3 Gy
Plan Total Fractions Prescribed: 10
Plan Total Prescribed Dose: 30 Gy
Reference Point Dosage Given to Date: 24 Gy
Reference Point Session Dosage Given: 3 Gy
Session Number: 8

## 2022-08-29 ENCOUNTER — Other Ambulatory Visit: Payer: Self-pay

## 2022-08-29 ENCOUNTER — Ambulatory Visit
Admission: RE | Admit: 2022-08-29 | Discharge: 2022-08-29 | Disposition: A | Discharge status: Home / Self Care | Payer: BLUE CROSS/BLUE SHIELD | Source: Ambulatory Visit | Attending: Radiation Oncology | Admitting: Radiation Oncology

## 2022-08-29 ENCOUNTER — Other Ambulatory Visit (HOSPITAL_COMMUNITY): Payer: Self-pay

## 2022-08-29 ENCOUNTER — Inpatient Hospital Stay: Payer: Medicaid Other

## 2022-08-29 ENCOUNTER — Inpatient Hospital Stay: Payer: Medicaid Other | Attending: Hematology and Oncology | Admitting: Internal Medicine

## 2022-08-29 ENCOUNTER — Encounter: Payer: Self-pay | Admitting: Internal Medicine

## 2022-08-29 ENCOUNTER — Other Ambulatory Visit: Payer: Self-pay | Admitting: Pharmacist

## 2022-08-29 VITALS — BP 105/72 | HR 97 | Temp 98.8°F | Resp 16 | Wt 121.5 lb

## 2022-08-29 DIAGNOSIS — C7931 Secondary malignant neoplasm of brain: Secondary | ICD-10-CM | POA: Insufficient documentation

## 2022-08-29 DIAGNOSIS — C3491 Malignant neoplasm of unspecified part of right bronchus or lung: Secondary | ICD-10-CM

## 2022-08-29 DIAGNOSIS — R609 Edema, unspecified: Secondary | ICD-10-CM | POA: Diagnosis not present

## 2022-08-29 DIAGNOSIS — Z794 Long term (current) use of insulin: Secondary | ICD-10-CM | POA: Insufficient documentation

## 2022-08-29 DIAGNOSIS — J90 Pleural effusion, not elsewhere classified: Secondary | ICD-10-CM | POA: Diagnosis not present

## 2022-08-29 DIAGNOSIS — Z5111 Encounter for antineoplastic chemotherapy: Secondary | ICD-10-CM

## 2022-08-29 DIAGNOSIS — C3490 Malignant neoplasm of unspecified part of unspecified bronchus or lung: Secondary | ICD-10-CM

## 2022-08-29 DIAGNOSIS — Z87891 Personal history of nicotine dependence: Secondary | ICD-10-CM | POA: Insufficient documentation

## 2022-08-29 DIAGNOSIS — Z5112 Encounter for antineoplastic immunotherapy: Secondary | ICD-10-CM

## 2022-08-29 DIAGNOSIS — Z79899 Other long term (current) drug therapy: Secondary | ICD-10-CM | POA: Diagnosis not present

## 2022-08-29 DIAGNOSIS — G4700 Insomnia, unspecified: Secondary | ICD-10-CM | POA: Diagnosis not present

## 2022-08-29 DIAGNOSIS — Z510 Encounter for antineoplastic radiation therapy: Secondary | ICD-10-CM | POA: Diagnosis not present

## 2022-08-29 DIAGNOSIS — C3411 Malignant neoplasm of upper lobe, right bronchus or lung: Secondary | ICD-10-CM | POA: Insufficient documentation

## 2022-08-29 DIAGNOSIS — C7951 Secondary malignant neoplasm of bone: Secondary | ICD-10-CM | POA: Insufficient documentation

## 2022-08-29 DIAGNOSIS — R110 Nausea: Secondary | ICD-10-CM | POA: Diagnosis not present

## 2022-08-29 DIAGNOSIS — E119 Type 2 diabetes mellitus without complications: Secondary | ICD-10-CM | POA: Insufficient documentation

## 2022-08-29 LAB — RAD ONC ARIA SESSION SUMMARY
Course Elapsed Days: 12
Plan Fractions Treated to Date: 9
Plan Prescribed Dose Per Fraction: 3 Gy
Plan Total Fractions Prescribed: 10
Plan Total Prescribed Dose: 30 Gy
Reference Point Dosage Given to Date: 27 Gy
Reference Point Session Dosage Given: 3 Gy
Session Number: 9

## 2022-08-29 LAB — CBC WITH DIFFERENTIAL/PLATELET
Abs Immature Granulocytes: 0.03 10*3/uL (ref 0.00–0.07)
Basophils Absolute: 0 10*3/uL (ref 0.0–0.1)
Basophils Relative: 0 %
Eosinophils Absolute: 0.6 10*3/uL — ABNORMAL HIGH (ref 0.0–0.5)
Eosinophils Relative: 8 %
HCT: 38.2 % — ABNORMAL LOW (ref 39.0–52.0)
Hemoglobin: 13.1 g/dL (ref 13.0–17.0)
Immature Granulocytes: 0 %
Lymphocytes Relative: 13 %
Lymphs Abs: 1.1 10*3/uL (ref 0.7–4.0)
MCH: 31.7 pg (ref 26.0–34.0)
MCHC: 34.3 g/dL (ref 30.0–36.0)
MCV: 92.5 fL (ref 80.0–100.0)
Monocytes Absolute: 0.5 10*3/uL (ref 0.1–1.0)
Monocytes Relative: 7 %
Neutro Abs: 5.8 10*3/uL (ref 1.7–7.7)
Neutrophils Relative %: 72 %
Platelets: 210 10*3/uL (ref 150–400)
RBC: 4.13 MIL/uL — ABNORMAL LOW (ref 4.22–5.81)
RDW: 13 % (ref 11.5–15.5)
WBC: 8.1 10*3/uL (ref 4.0–10.5)
nRBC: 0 % (ref 0.0–0.2)

## 2022-08-29 LAB — COMPREHENSIVE METABOLIC PANEL
ALT: 18 U/L (ref 0–44)
AST: 10 U/L — ABNORMAL LOW (ref 15–41)
Albumin: 4.1 g/dL (ref 3.5–5.0)
Alkaline Phosphatase: 96 U/L (ref 38–126)
Anion gap: 6 (ref 5–15)
BUN: 12 mg/dL (ref 6–20)
CO2: 30 mmol/L (ref 22–32)
Calcium: 9.8 mg/dL (ref 8.9–10.3)
Chloride: 97 mmol/L — ABNORMAL LOW (ref 98–111)
Creatinine, Ser: 0.63 mg/dL (ref 0.61–1.24)
GFR, Estimated: >60 mL/min (ref >60)
Glucose, Bld: 520 mg/dL (ref 70–99)
Potassium: 4.9 mmol/L (ref 3.5–5.1)
Sodium: 133 mmol/L — ABNORMAL LOW (ref 135–145)
Total Bilirubin: 0.3 mg/dL (ref 0.3–1.2)
Total Protein: 7.8 g/dL (ref 6.5–8.1)

## 2022-08-29 MED ORDER — INSULIN ASPART 100 UNIT/ML IJ SOLN
10.0000 [IU] | Freq: Once | INTRAMUSCULAR | Status: AC
  Administered 2022-08-29: 10 [IU] via SUBCUTANEOUS
  Filled 2022-08-29: qty 1

## 2022-08-29 MED ORDER — CYANOCOBALAMIN 1000 MCG/ML IJ SOLN: 1000.0000 ug | Freq: Once | INTRAMUSCULAR | Status: DC

## 2022-08-29 MED ORDER — CYANOCOBALAMIN 1000 MCG/ML IJ SOLN
1000.0000 ug | Freq: Once | INTRAMUSCULAR | Status: AC
  Administered 2022-08-29: 1000 ug via INTRAMUSCULAR
  Filled 2022-08-29: qty 1

## 2022-08-29 MED ORDER — PROCHLORPERAZINE MALEATE 10 MG PO TABS: 10.0000 mg | ORAL_TABLET | Freq: Four times a day (QID) | ORAL | 0 refills | Status: DC | PRN

## 2022-08-29 MED ORDER — FOLIC ACID 1 MG PO TABS: 1.0000 mg | ORAL_TABLET | Freq: Every day | ORAL | 4 refills | Status: DC

## 2022-08-29 NOTE — Progress Notes (Signed)
START ON PATHWAY REGIMEN - Non-Small Cell Lung     A cycle is every 21 days:     Pembrolizumab      Pemetrexed      Carboplatin   **Always confirm dose/schedule in your pharmacy ordering system**  Patient Characteristics: Stage IV Metastatic, Nonsquamous, Molecular Analysis Completed, Molecular Alteration Present and Targeted Therapy Exhausted OR EGFR Exon 20+ or KRAS G12C+ or HER2+ Present and No Prior Chemo/Immunotherapy OR No Alteration Present, Initial  Chemotherapy/Immunotherapy, PS = 0, 1, No Alteration Present, No Alteration Present, Candidate for Immunotherapy, PD-L1 Expression Positive  ? 50% (TPS) and Immunotherapy Candidate Therapeutic Status: Stage IV Metastatic Histology: Nonsquamous Cell Broad Molecular Profiling Status: Engineer, manufacturing Analysis Results: No Alteration Present ECOG Performance Status: 1 Chemotherapy/Immunotherapy Line of Therapy: Initial Chemotherapy/Immunotherapy EGFR Exons 18-21 Mutation Testing Status: Completed and Negative ALK Fusion/Rearrangement Testing Status: Completed and Negative BRAF V600 Mutation Testing Status: Completed and Negative KRAS G12C Mutation Testing Status: Completed and Negative MET Exon 14 Mutation Testing Status: Completed and Negative RET Fusion/Rearrangement Testing Status: Completed and Negative HER2 Mutation Testing Status: Completed and Negative NTRK Fusion/Rearrangement Testing Status: Completed and Negative ROS1 Fusion/Rearrangement Testing Status: Completed and Negative Immunotherapy Candidate Status: Candidate for Immunotherapy PD-L1 Expression Status: PD-L1 Positive ? 50% (TPS) Intent of Therapy: Non-Curative / Palliative Intent, Discussed with Patient

## 2022-08-29 NOTE — Patient Instructions (Signed)
Steps to Quit Smoking Smoking tobacco is the leading cause of preventable death. It can affect almost every organ in the body. Smoking puts you and people around you at risk for many serious, long-lasting (chronic) diseases. Quitting smoking can be hard, but it is one of the best things that you can do for your health. It is never too late to quit. Do not give up if you cannot quit the first time. Some people need to try many times to quit. Do your best to stick to your quit plan, and talk with your doctor if you have any questions or concerns. How do I get ready to quit? Pick a date to quit. Set a date within the next 2 weeks to give you time to prepare. Write down the reasons why you are quitting. Keep this list in places where you will see it often. Tell your family, friends, and co-workers that you are quitting. Their support is important. Talk with your doctor about the choices that may help you quit. Find out if your health insurance will pay for these treatments. Know the people, places, things, and activities that make you want to smoke (triggers). Avoid them. What first steps can I take to quit smoking? Throw away all cigarettes at home, at work, and in your car. Throw away the things that you use when you smoke, such as ashtrays and lighters. Clean your car. Empty the ashtray. Clean your home, including curtains and carpets. What can I do to help me quit smoking? Talk with your doctor about taking medicines and seeing a counselor. You are more likely to succeed when you do both. If you are pregnant or breastfeeding: Talk with your doctor about counseling or other ways to quit smoking. Do not take medicine to help you quit smoking unless your doctor tells you to. Quit right away Quit smoking completely, instead of slowly cutting back on how much you smoke over a period of time. Stopping smoking right away may be more successful than slowly quitting. Go to counseling. In-person is best  if this is an option. You are more likely to quit if you go to counseling sessions regularly. Take medicine You may take medicines to help you quit. Some medicines need a prescription, and some you can buy over-the-counter. Some medicines may contain a drug called nicotine to replace the nicotine in cigarettes. Medicines may: Help you stop having the desire to smoke (cravings). Help to stop the problems that come when you stop smoking (withdrawal symptoms). Your doctor may ask you to use: Nicotine patches, gum, or lozenges. Nicotine inhalers or sprays. Non-nicotine medicine that you take by mouth. Find resources Find resources and other ways to help you quit smoking and remain smoke-free after you quit. They include: Online chats with a counselor. Phone quitlines. Printed self-help materials. Support groups or group counseling. Text messaging programs. Mobile phone apps. Use apps on your mobile phone or tablet that can help you stick to your quit plan. Examples of free services include Quit Guide from the CDC and smokefree.gov  What can I do to make it easier to quit?  Talk to your family and friends. Ask them to support and encourage you. Call a phone quitline, such as 1-800-QUIT-NOW, reach out to support groups, or work with a counselor. Ask people who smoke to not smoke around you. Avoid places that make you want to smoke, such as: Bars. Parties. Smoke-break areas at work. Spend time with people who do not smoke. Lower   the stress in your life. Stress can make you want to smoke. Try these things to lower stress: Getting regular exercise. Doing deep-breathing exercises. Doing yoga. Meditating. What benefits will I see if I quit smoking? Over time, you may have: A better sense of smell and taste. Less coughing and sore throat. A slower heart rate. Lower blood pressure. Clearer skin. Better breathing. Fewer sick days. Summary Quitting smoking can be hard, but it is one of  the best things that you can do for your health. Do not give up if you cannot quit the first time. Some people need to try many times to quit. When you decide to quit smoking, make a plan to help you succeed. Quit smoking right away, not slowly over a period of time. When you start quitting, get help and support to keep you smoke-free. This information is not intended to replace advice given to you by your health care provider. Make sure you discuss any questions you have with your health care provider. Document Revised: 10/29/2021 Document Reviewed: 10/29/2021 [employer] Patient Education  2023 [employer]  

## 2022-08-29 NOTE — Progress Notes (Signed)
[place] [phone]   [phone]  OFFICE PROGRESS NOTE  [place], [name], MD 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  Biomarker Findings Microsatellite status - MS-Stable Tumor Mutational Burden - 8 Muts/Mb Genomic Findings For a complete list of the genes assayed, please refer to the Appendix. KEAP1 Q227* CDKN2A/B [id number] G89V PRKCI amplification TERC amplification - equivocal? TP53 [id number] 8 Disease relevant genes with no reportable alterations: ALK, BRAF, EGFR, ERBB2, KRAS, MET, RET, ROS1  PDL1 TPS  90%  PRIOR THERAPY:  1) Status post palliative radiotherapy to the L2 lesion under the care of Dr. MTammi Klippel 2) Status post SRS to solitary brain metastasis under the care of Dr. MTammi Klippel CURRENT THERAPY: Palliative systemic therapy with carboplatin for AUC of 5, Alimta 500 Mg/M2 and Keytruda 200 Mg IV every 3 weeks.  First dose September 05, 2022.  INTERVAL HISTORY: Samuel [name] 56y.o. male returns to the clinic today for follow-up visit accompanied by his sister Samuel [name]  The patient is feeling much better now with less pain in the lower extremity after the palliative radiotherapy by Dr. MTammi Klippel  He was also found to have a solitary brain metastasis and treated with SRS.  The patient had a PET scan as well as MRI of the brain performed since his hospitalization.  He also had molecular studies by [facility] that showed PD-L1 expression of 90% but no action mutations.  He has no current chest pain, shortness of breath, cough or hemoptysis.  He has no  nausea, vomiting, diarrhea or constipation.  He has no headache or visual changes.  He is here today for evaluation and discussion of his treatment options.  MEDICAL HISTORY: Past Medical History:  Diagnosis Date   Diabetes mellitus without complication ([facility] [place]    [place] ([facility] 08/05/2022   Metastatic cancer to spine ([facility] 07/19/2022   Pancreatitis unk   Primary lung adenocarcinoma ([facility] 08/05/2022    ALLERGIES:  is allergic to penicillins and percocet [oxycodone-acetaminophen].  MEDICATIONS:  Current Outpatient Medications  Medication Sig Dispense Refill   blood glucose meter kit and supplies KIT Dispense based on patient and insurance preference. Use up to four times daily as directed. 1 each 0   docusate sodium (COLACE) 100 MG capsule Take 1 capsule (100 mg total) by mouth 2 (two) times daily. 30 capsule 0   escitalopram (LEXAPRO) 20 MG tablet Take 1 tablet (20 mg total) by mouth daily. 30 tablet 2   Fingerstix Lancets MISC use as directed 100 each 0   gabapentin (NEURONTIN) 300 MG capsule Take 1 capsule (300 mg total) by mouth 3 (three) times daily. 90 capsule 3   glucose blood test strip use as directed 100 each 0   Insulin Lispro Prot & Lispro (HUMALOG 75/25 MIX) (75-25) 100 UNIT/ML Kwikpen Inject 10 units into the skin 2 times daily with a meal 15 mL 5   Insulin Pen Needle 32G X 4 MM MISC Use with Insulin pen 100 each 3   ipratropium-albuterol (DUONEB) 0.5-2.5 (3) MG/3ML SOLN Take 3 mLs by nebulization every 6 (six) hours as needed (Shortness of  breath). 360 mL 0   oxyCODONE (OXY IR/ROXICODONE) 5 MG immediate release tablet Take 1 tablet (5 mg total) by mouth every 6 (six) hours as needed for severe pain. 120 tablet 0   oxyCODONE (ROXICODONE) 5 MG immediate release tablet Take 1 tablet (5 mg total) by mouth every 6 (six) hours as needed (For Cancer Pain in the Spine). 20 tablet 0   polyethylene glycol powder (GLYCOLAX/MIRALAX) 17 GM/SCOOP powder Take 1 capful  (17 g)  with water by mouth daily as needed. 238 g 0   acetaminophen (TYLENOL) 500 MG tablet Take 1,000 mg by mouth every 6 (six) hours as needed for mild pain or moderate pain. (Patient not taking: Reported on 08/29/2022)     atorvastatin (LIPITOR) 40 MG tablet Take 1 tablet (40 mg total) by mouth daily. (Patient not taking: Reported on 08/29/2022) 30 tablet 2   cyclobenzaprine (FLEXERIL) 10 MG tablet Take 1 tablet (10 mg total) by mouth 2 (two) times daily as needed for up to 20 doses for muscle spasms. (Patient not taking: Reported on 07/29/2022) 20 tablet 0   No current facility-administered medications for this visit.    SURGICAL HISTORY:  Past Surgical History:  Procedure Laterality Date   IR BONE TUMOR(S)RF ABLATION  08/01/2022   IR KYPHO LUMBAR INC FX REDUCE BONE BX UNI/BIL CANNULATION INC/IMAGING  08/01/2022    REVIEW OF SYSTEMS:  Constitutional: positive for fatigue Eyes: negative Ears, nose, mouth, throat, and face: negative Respiratory: negative Cardiovascular: negative Gastrointestinal: negative Genitourinary:negative Integument/breast: negative Hematologic/lymphatic: negative Musculoskeletal:positive for back pain Neurological: negative Behavioral/Psych: negative Endocrine: negative Allergic/Immunologic: negative   PHYSICAL EXAMINATION: General appearance: alert, cooperative, fatigued, and no distress Head: Normocephalic, without obvious abnormality, atraumatic Neck: no adenopathy, no JVD, supple, symmetrical, trachea midline, and thyroid not enlarged, symmetric, no tenderness/mass/nodules Lymph nodes: Cervical, supraclavicular, and axillary nodes normal. Resp: clear to auscultation bilaterally Back: symmetric, no curvature. ROM normal. No CVA tenderness. Cardio: regular rate and rhythm, S1, S2 normal, no murmur, click, rub or gallop GI: soft, non-tender; bowel sounds normal; no masses,  no organomegaly Extremities: extremities normal, atraumatic, no cyanosis or  edema Neurologic: Alert and oriented X 3, normal strength and tone. Normal symmetric reflexes. Normal coordination and gait  ECOG PERFORMANCE STATUS: 1 - Symptomatic but completely ambulatory  Blood pressure 105/72, pulse 97, temperature 98.8 F (37.1 C), temperature source Oral, resp. rate 16, weight 121 lb 8 oz (55.1 kg), SpO2 99 %.  LABORATORY DATA: Lab Results  Component Value Date   WBC 8.1 08/29/2022   HGB 13.1 08/29/2022   HCT 38.2 (L) 08/29/2022   MCV 92.5 08/29/2022   PLT 210 08/29/2022      Chemistry      Component Value Date/Time   NA 137 08/17/2022 0716   NA 140 12/24/2020 1144   NA 142 08/02/2014 1649   K 3.9 08/17/2022 0716   K 4.2 08/02/2014 1649   CL 104 08/17/2022 0716   CL 109 (H) 08/02/2014 1649   CO2 29 08/17/2022 0716   CO2 21 08/02/2014 1649   BUN 17 08/17/2022 0716   BUN 7 12/24/2020 1144   BUN 11 08/02/2014 1649   CREATININE 0.54 (L) 08/17/2022 0716   CREATININE 0.92 08/02/2014 1649      Component Value Date/Time   CALCIUM 8.9 08/17/2022 0716   CALCIUM 8.6 08/02/2014 1649   ALKPHOS 62 08/13/2022 0904   ALKPHOS 82 08/02/2014 1649   AST 16 08/13/2022 0904   AST 79 (H) 08/02/2014 1649  ALT 20 08/13/2022 0904   ALT 132 (H) 08/02/2014 1649   BILITOT 0.2 (L) 08/13/2022 0904   BILITOT <0.2 12/24/2020 1144   BILITOT 0.2 08/02/2014 1649       RADIOGRAPHIC STUDIES: NM PET Image Initial (PI) Skull Base To Thigh  Addendum Date: 08/10/2022   ADDENDUM REPORT: 08/10/2022 12:19 ADDENDUM: Thoracic MRI 07/29/2022 questioned a small metastasis inferiorly in the T9 vertebral body. There is no abnormal hypermetabolic activity in this area or the additional bones outside of the lumbar spine on this PET-CT. Electronically Signed   By: Richardean Sale M.D.   On: 08/10/2022 12:19   Result Date: 08/10/2022 CLINICAL DATA:  Initial treatment strategy for osseous metastatic disease. Adenocarcinoma on recent L2 biopsy. EXAM: NUCLEAR MEDICINE PET SKULL BASE TO THIGH  TECHNIQUE: 5.32 mCi F-18 FDG was injected intravenously. Full-ring PET imaging was performed from the skull base to thigh after the radiotracer. CT data was obtained and used for attenuation correction and anatomic localization. Fasting blood glucose: 81 mg/dl COMPARISON:  MRI of the thoracolumbar spine 07/29/2022, chest CT 06/28/2022 and abdominopelvic CT 04/04/2022 and 05/05/2022. FINDINGS: Mediastinal blood pool activity: SUV max 0.9 NECK: No hypermetabolic cervical lymph nodes are identified.Fairly symmetric activity within the lymphoid tissue of Waldeyer's ring is within physiologic limits. No suspicious activity identified within the pharyngeal mucosal space. Incidental CT findings: none CHEST: There are multiple hypermetabolic mediastinal and right hilar lymph nodes. There is a high right paratracheal node with an SUV max of 8.9, a subcarinal node with an SUV max of 10.9 and a right hilar node with an SUV max of 13.6. No hypermetabolic axillary or left hilar adenopathy. Pulmonary assessment is limited by interval development of extensive ground-glass opacities in both lungs, demonstrating an upper lobe predominance and moderate bilateral pleural effusions. These pulmonary opacities are hypermetabolic (SUV max 8.2 in the right upper lobe), likely infectious. There is a hypermetabolic solid nodule medially in the right upper lobe measuring 1.1 cm on image 30/7 (SUV max 6.2). There is a hypermetabolic nodule medially in the right lower lobe measuring 1.3 x 0.9 cm on image 36/7 (SUV max 5.1). Incidental CT findings: As above, since previous chest CT of 5 weeks ago, the patient has developed diffuse ground-glass opacities in both lungs with associated hypermetabolic activity. There are moderate bilateral pleural effusions with resulting compressive atelectasis in both lungs. Mild aortic atherosclerosis. ABDOMEN/PELVIS: There is no hypermetabolic activity within the liver, adrenal glands, spleen or pancreas. Mild  hypermetabolic activity in the gastrohepatic ligament (SUV max 4.9) which may correspond with proximal stomach or a small gastrohepatic ligament node. No other hypermetabolic or enlarged abdominopelvic lymph nodes are identified. No other suspicious bowel activity. Incidental CT findings: Limited by paucity of intra-abdominal fat. Aortic and branch vessel atherosclerosis with probable small nonobstructing right renal calculi. SKELETON: There is intense hypermetabolic activity associated with the known osseous metastatic disease within the lumbar spine. Patient has undergone interval L2 spinal augmentation. There is hypermetabolic activity in the vertebral body and posterior elements (SUV max 11.3). There is additional hypermetabolic metastatic disease within the L3 vertebral body and posterior elements at L4. No definite osseous metastases outside of the lumbar spine (see thoracic spine MRI report). Incidental CT findings: Cannot exclude residual significant mass effect on the spinal canal by the pathologic fracture at L2 (assessment limited by beam hardening artifact from the methylmethacrylate). IMPRESSION: 1. Known osseous metastatic disease in the lumbar spine is significantly hypermetabolic. Cannot exclude residual spinal stenosis at the L2 pathologic  fracture post spinal augmentation. 2. Interval development of widespread ground-glass opacities throughout both lungs with moderate bilateral pleural effusions, most likely reflecting viral infection or drug reaction. Correlate clinically. 3. These pulmonary findings limit assessment of the lungs, but there are at least 2 hypermetabolic right pulmonary nodules as well as hypermetabolic mediastinal and right hilar adenopathy which may relate to metastatic bronchogenic carcinoma. 4. Hypermetabolic activity in the gastrohepatic ligament, likely related to a small lymph node. No evidence of metastatic disease within the liver or adrenal glands. Electronically Signed:  By: Richardean Sale M.D. On: 08/04/2022 14:29   MR BRAIN W WO CONTRAST  Addendum Date: 08/07/2022   ADDENDUM REPORT: 08/07/2022 14:05 ADDENDUM: A THIRD small right occipital pole enhancing metastasis is identified on series 100, image 59 and series 9, image 47. This lesion is 4 mm diameter and there is mild regional edema on series 4, image 22. This will be discussed at [facility] on 08/08/2022. Electronically Signed   By: Genevie Ann M.D.   On: 08/07/2022 14:05   Result Date: 08/07/2022 CLINICAL DATA:  Non-small cell lung carcinoma staging EXAM: MRI HEAD WITHOUT AND WITH CONTRAST TECHNIQUE: Multiplanar, multiecho pulse sequences of the brain and surrounding structures were obtained without and with intravenous contrast. CONTRAST:  4.8 mL gadopiclenol (Vueway) 0.5 mmol/ml solution COMPARISON:  None Available. FINDINGS: Brain: No acute infarct, mass effect or extra-axial collection. Single right cerebellar chronic microhemorrhage. Normal white matter signal, parenchymal volume and CSF spaces. The midline structures are normal. There are punctate contrast enhancing lesions of the right cerebellum and left temporal lobe (series 1100, images 117 and 147). Vascular: Major flow voids are preserved. Skull and upper cervical spine: Normal calvarium and skull base. Visualized upper cervical spine and soft tissues are normal. Sinuses/Orbits:No paranasal sinus fluid levels or advanced mucosal thickening. No mastoid or middle ear effusion. Normal orbits. IMPRESSION: Punctate contrast enhancing lesions of the right cerebellum and left temporal lobe, consistent with metastatic disease. Electronically Signed: By: Ulyses Jarred M.D. On: 08/05/2022 01:28   DG FEMUR MIN 2 VIEWS LEFT  Result Date: 08/05/2022 CLINICAL DATA:  Metastatic carcinoma, pain left thigh EXAM: LEFT FEMUR 2 VIEWS COMPARISON:  None Available. FINDINGS: No fracture or dislocation is seen. There are no aggressive appearing focal  lytic or sclerotic lesions. Small sclerotic density in neck of left femur may suggest benign bone island. In the PET-CT done on 08/04/2022, there was no hypermetabolic activity in the proximal left femur. IMPRESSION: No fracture or dislocation is seen. There is no evidence of any aggressive appearing lytic or sclerotic lesions. Electronically Signed   By: Elmer Picker M.D.   On: 08/05/2022 14:49   IR KYPHO LUMBAR INC FX REDUCE BONE BX UNI/BIL CANNULATION INC/IMAGING  Result Date: 08/01/2022 INDICATION: RONTAE INGLETT II is a 56 y.o. male with a past medical history significant for pancreatitis and diabetes mellitus. Patient reports back pain and weight loss for approximately 4 months. He presented to ED in May due to back pain and a CT of the abdomen showed a lytic L2 vertebral body lesion as well as an L4 spinous process lesion without intra-abdominal malignancy. A chest CT performed June 28, 2022 showed no definitive evidence of malignancy. Due to worsening back pain, patient again presented to ED on 07/17/2022. CT of the lumbar spine showed worsening presumed pathologic fracture of the L2 vertebral body with small retropulsion, extension into the posterior elements and more than 50% height loss, new lytic lesion in the  L3 right pedicle and progression of the lytic lesion in the L4 spinous process, now involving the lamina bilaterally. Patient has persistent back pain severely limiting quality of life. During office visit on 07/22/2022, he rated the pain 12/10 when he wakes up with some improvement after he takes medication when it lowers to a more tolerable degree, with worsening to 7-8/10 when moving. The pain radiates to the right leg and severely limits his ability to walk. On 07/29/2022, he presented again to ED and was admitted due to worsening pain and weakness. He comes to our service today for fluoroscopy guided L2 core biopsy, RFA and kyphoplasty. EXAM: FLUOROSCOPY GUIDED L2 CORE BONE  BIOPSY, RADIOFREQUENCY ABLATION AND BALLOON KYPHOPLASTY COMPARISON:  MRI of the lumbar spine September 2023. MEDICATIONS: Vancomycin 1 g IV; The antibiotic was administered in an appropriate time interval prior to needle puncture of the skin. ANESTHESIA/SEDATION: Total intra-service Moderate Sedation Time: 60 minutes. A total of 2.5 mg of Versed IV and 150 mcg of fentanyl IV were administered by the Radiology nurse monitored the patients level of consciousness and vital signs continuously throughout the procedure under my direct supervision. FLUOROSCOPY: Radiation Exposure Index (as provided by the fluoroscopic device): 8,315 mGy Kerma COMPLICATIONS: None immediate. TECHNIQUE: Informed written consent was obtained from the patient after a thorough discussion of the procedural risks, benefits and alternatives. All questions were addressed. Maximal Sterile Barrier Technique was utilized including caps, mask, sterile gowns, sterile gloves, sterile drape, hand hygiene and skin antiseptic. A timeout was performed prior to the initiation of the procedure. The patient was placed in prone position on the angiography table. The lumbar region was prepped and draped in a sterile fashion. Under fluoroscopy, the L2 vertebral body was delineated and the skin area was marked. The skin was infiltrated with a 1% Lidocaine approximately 5 cm lateral to the spinous process projection on the right. Using a 22-gauge spinal needle, the soft issue and the peripedicular space and periosteum were infiltrated with Bupivacaine 0.5%. A skin incision was made at the access site. Subsequently, an 11-gauge Kyphon trocar was inserted under fluoroscopic guidance until contact with the pedicle was obtained. The trocar was inserted into the pedicle with light hammer tapping until the posterior boundaries of the vertebral body was reached. The diamond mandrill was removed and one core biopsy was obtained. The skin was infiltrated with a 1% Lidocaine  approximately 4 cm lateral to the spinous process projection on the left. Using a 22-gauge spinal needle, the soft issue and the peripedicular space and periosteum were infiltrated with Bupivacaine 0.5%. A skin incision was made at the access site. Subsequently, an 11-gauge Kyphon trocar was inserted under fluoroscopic guidance until contact with the pedicle was obtained. The trocar was inserted with light hammer tapping into the pedicle until the posterior boundaries of the vertebral body was reached. A bone drill was coaxially advanced within the anterior third of the vertebral body on each side for proper RF probes size selection. An OsteoCool RF probe was inserted bilaterally within the mid-posterior third of the vertebral body. The radiofrequency ablation cycle was performed. Thereafter, the RF probes were exchanged for inflatable Kyphon balloons, which were centered within the mid-aspect of the vertebral body. The balloons were inflated to create a void to serve as a repository for the bone cement. Both balloons were deflated and through both cannulas, under continuous fluoroscopy guidance in the AP and lateral views, the vertebral body was filled with previously mixed polymethyl-methacrylate (PMMA) added to barium  for opacification. Both cannulas were later removed. Post procedural radiographed shows evidence of significant cement extravasation. FINDINGS: Pathologic compression fracture of the L2 vertebral body with compromise of the right lateral wall and right pedicle. IMPRESSION: Successful L2 core biopsy followed by radiofrequency ablation and balloon kyphoplasty. Electronically Signed   By: Pedro Earls M.D.   On: 08/01/2022 14:48   IR Bone Tumor(s)RF Ablation  Result Date: 08/01/2022 INDICATION: TRAYVEON BECKFORD II is a 56 y.o. male with a past medical history significant for pancreatitis and diabetes mellitus. Patient reports back pain and weight loss for approximately 4 months. He  presented to ED in May due to back pain and a CT of the abdomen showed a lytic L2 vertebral body lesion as well as an L4 spinous process lesion without intra-abdominal malignancy. A chest CT performed June 28, 2022 showed no definitive evidence of malignancy. Due to worsening back pain, patient again presented to ED on 07/17/2022. CT of the lumbar spine showed worsening presumed pathologic fracture of the L2 vertebral body with small retropulsion, extension into the posterior elements and more than 50% height loss, new lytic lesion in the L3 right pedicle and progression of the lytic lesion in the L4 spinous process, now involving the lamina bilaterally. Patient has persistent back pain severely limiting quality of life. During office visit on 07/22/2022, he rated the pain 12/10 when he wakes up with some improvement after he takes medication when it lowers to a more tolerable degree, with worsening to 7-8/10 when moving. The pain radiates to the right leg and severely limits his ability to walk. On 07/29/2022, he presented again to ED and was admitted due to worsening pain and weakness. He comes to our service today for fluoroscopy guided L2 core biopsy, RFA and kyphoplasty. EXAM: FLUOROSCOPY GUIDED L2 CORE BONE BIOPSY, RADIOFREQUENCY ABLATION AND BALLOON KYPHOPLASTY COMPARISON:  MRI of the lumbar spine September 2023. MEDICATIONS: Vancomycin 1 g IV; The antibiotic was administered in an appropriate time interval prior to needle puncture of the skin. ANESTHESIA/SEDATION: Total intra-service Moderate Sedation Time: 60 minutes. A total of 2.5 mg of Versed IV and 150 mcg of fentanyl IV were administered by the Radiology nurse monitored the patients level of consciousness and vital signs continuously throughout the procedure under my direct supervision. FLUOROSCOPY: Radiation Exposure Index (as provided by the fluoroscopic device): 4,765 mGy Kerma COMPLICATIONS: None immediate. TECHNIQUE: Informed written consent was  obtained from the patient after a thorough discussion of the procedural risks, benefits and alternatives. All questions were addressed. Maximal Sterile Barrier Technique was utilized including caps, mask, sterile gowns, sterile gloves, sterile drape, hand hygiene and skin antiseptic. A timeout was performed prior to the initiation of the procedure. The patient was placed in prone position on the angiography table. The lumbar region was prepped and draped in a sterile fashion. Under fluoroscopy, the L2 vertebral body was delineated and the skin area was marked. The skin was infiltrated with a 1% Lidocaine approximately 5 cm lateral to the spinous process projection on the right. Using a 22-gauge spinal needle, the soft issue and the peripedicular space and periosteum were infiltrated with Bupivacaine 0.5%. A skin incision was made at the access site. Subsequently, an 11-gauge Kyphon trocar was inserted under fluoroscopic guidance until contact with the pedicle was obtained. The trocar was inserted into the pedicle with light hammer tapping until the posterior boundaries of the vertebral body was reached. The diamond mandrill was removed and one core biopsy was obtained.  The skin was infiltrated with a 1% Lidocaine approximately 4 cm lateral to the spinous process projection on the left. Using a 22-gauge spinal needle, the soft issue and the peripedicular space and periosteum were infiltrated with Bupivacaine 0.5%. A skin incision was made at the access site. Subsequently, an 11-gauge Kyphon trocar was inserted under fluoroscopic guidance until contact with the pedicle was obtained. The trocar was inserted with light hammer tapping into the pedicle until the posterior boundaries of the vertebral body was reached. A bone drill was coaxially advanced within the anterior third of the vertebral body on each side for proper RF probes size selection. An OsteoCool RF probe was inserted bilaterally within the mid-posterior  third of the vertebral body. The radiofrequency ablation cycle was performed. Thereafter, the RF probes were exchanged for inflatable Kyphon balloons, which were centered within the mid-aspect of the vertebral body. The balloons were inflated to create a void to serve as a repository for the bone cement. Both balloons were deflated and through both cannulas, under continuous fluoroscopy guidance in the AP and lateral views, the vertebral body was filled with previously mixed polymethyl-methacrylate (PMMA) added to barium for opacification. Both cannulas were later removed. Post procedural radiographed shows evidence of significant cement extravasation. FINDINGS: Pathologic compression fracture of the L2 vertebral body with compromise of the right lateral wall and right pedicle. IMPRESSION: Successful L2 core biopsy followed by radiofrequency ablation and balloon kyphoplasty. Electronically Signed   By: Pedro Earls M.D.   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 He has molecular studies by foundation 1 that showed no actionable mutations and PD-L1 expression was 90%. He underwent palliative radiotherapy to the L2 vertebral body compression fraction as well as solitary brain metastasis with SRS. I had a lengthy discussion with the patient and his sister today about his current condition and treatment options. I explained to the patient that he has incurable condition and all the treatment will be of palliative nature. I gave the patient the option of  palliative care versus palliative systemic chemotherapy with carboplatin for AUC of 5, Alimta 500 Mg/M2 and Keytruda 200 Mg IV every 3 weeks. 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 The patient is in agreement with proceeding with the combination of chemotherapy and immunotherapy. I discussed with him the adverse effect of this treatment including but not limited to alopecia, myelosuppression, nausea and vomiting, peripheral neuropathy, liver or renal dysfunction as well as immunotherapy adverse effects. He is expected to start the first cycle of this treatment next week. I will arrange for the patient to receive vitamin B12 injection today. I will also arrange for the patient to have chemotherapy education class before the first dose of his treatment. For the history of diabetes mellitus and hyperglycemia today, he did not take his medication.  I strongly recommend for the patient to take his medication as prescribed and also I gave him 10 units of regular  insulin in the clinic today. I will also call his pharmacy with prescription for folic acid 1 mg p.o. daily in addition to Compazine 10 mg p.o. every 6 hours as needed for nausea. The patient will come back for follow-up visit 1 week after his treatment for evaluation and management of any adverse effect of his treatment. The patient was advised to call immediately if he has any other concerning symptoms in the interval. The patient voices understanding of current disease status and treatment options and is in agreement with the current care plan.  All questions were answered. The patient knows to call the clinic with any problems, questions or concerns. We can certainly see the patient much sooner if necessary.  The total time spent in the appointment was 55 minutes.  Disclaimer:  This note was dictated with voice recognition software. Similar sounding words can inadvertently be transcribed and may not be corrected upon review.

## 2022-08-30 ENCOUNTER — Ambulatory Visit
Admission: RE | Admit: 2022-08-30 | Discharge: 2022-08-30 | Disposition: A | Discharge status: Home / Self Care | Payer: BLUE CROSS/BLUE SHIELD | Source: Ambulatory Visit | Attending: Radiation Oncology | Admitting: Radiation Oncology

## 2022-08-30 ENCOUNTER — Telehealth: Payer: Self-pay | Admitting: Internal Medicine

## 2022-08-30 ENCOUNTER — Encounter (HOSPITAL_COMMUNITY): Payer: Self-pay

## 2022-08-30 ENCOUNTER — Inpatient Hospital Stay: Payer: Medicaid Other | Admitting: Nurse Practitioner

## 2022-08-30 ENCOUNTER — Ambulatory Visit: Payer: BLUE CROSS/BLUE SHIELD

## 2022-08-30 ENCOUNTER — Encounter: Payer: Self-pay | Admitting: Urology

## 2022-08-30 ENCOUNTER — Other Ambulatory Visit: Payer: Self-pay

## 2022-08-30 ENCOUNTER — Inpatient Hospital Stay: Payer: Medicaid Other

## 2022-08-30 DIAGNOSIS — Z510 Encounter for antineoplastic radiation therapy: Secondary | ICD-10-CM | POA: Diagnosis not present

## 2022-08-30 LAB — RAD ONC ARIA SESSION SUMMARY
Course Elapsed Days: 13
Plan Fractions Treated to Date: 10
Plan Prescribed Dose Per Fraction: 3 Gy
Plan Total Fractions Prescribed: 10
Plan Total Prescribed Dose: 30 Gy
Reference Point Dosage Given to Date: 30 Gy
Reference Point Session Dosage Given: 3 Gy
Session Number: 10

## 2022-08-30 NOTE — Telephone Encounter (Signed)
Scheduled per 10/09 los, patient has been called and notified of upcoming October, November, December and January appointments.

## 2022-08-31 ENCOUNTER — Telehealth: Payer: Self-pay

## 2022-08-31 ENCOUNTER — Other Ambulatory Visit: Payer: Self-pay | Admitting: Medical Oncology

## 2022-08-31 ENCOUNTER — Other Ambulatory Visit: Payer: Self-pay

## 2022-08-31 NOTE — Telephone Encounter (Signed)
Called pt and LVM to advise that palliative care appointment is scheduled for 09/05/22 @ 10:30 with Lexine Baton NP in Palliative Care.

## 2022-09-01 ENCOUNTER — Other Ambulatory Visit: Payer: Self-pay

## 2022-09-02 ENCOUNTER — Inpatient Hospital Stay: Payer: Medicaid Other

## 2022-09-02 ENCOUNTER — Encounter: Payer: Self-pay | Admitting: Internal Medicine

## 2022-09-02 MED FILL — Fosaprepitant Dimeglumine For IV Infusion 150 MG (Base Eq): INTRAVENOUS | Qty: 5 | Status: AC

## 2022-09-02 MED FILL — Dexamethasone Sodium Phosphate Inj 100 MG/10ML: INTRAMUSCULAR | Qty: 1 | Status: AC

## 2022-09-02 NOTE — Progress Notes (Signed)
Called pt to introduce myself as his Arboriculturist and to discuss the [employer].  He would like to apply and since he's not working right now he will provide a letter of support on 09/05/22.  Once received I will approve him for the grant, give him an expense sheet and my card for any questions or concerns he may have in the future.

## 2022-09-03 ENCOUNTER — Inpatient Hospital Stay: Payer: Medicaid Other

## 2022-09-05 ENCOUNTER — Other Ambulatory Visit: Payer: Self-pay | Admitting: Medical Oncology

## 2022-09-05 ENCOUNTER — Inpatient Hospital Stay: Payer: Medicaid Other | Admitting: Nurse Practitioner

## 2022-09-05 ENCOUNTER — Inpatient Hospital Stay: Payer: Medicaid Other

## 2022-09-05 VITALS — BP 105/69 | HR 79 | Temp 98.4°F | Resp 17 | Wt 126.5 lb

## 2022-09-05 DIAGNOSIS — I878 Other specified disorders of veins: Secondary | ICD-10-CM

## 2022-09-05 DIAGNOSIS — Z5111 Encounter for antineoplastic chemotherapy: Secondary | ICD-10-CM | POA: Diagnosis not present

## 2022-09-05 DIAGNOSIS — C3490 Malignant neoplasm of unspecified part of unspecified bronchus or lung: Secondary | ICD-10-CM

## 2022-09-05 LAB — CBC WITH DIFFERENTIAL (CANCER CENTER ONLY)
Abs Immature Granulocytes: 0.02 10*3/uL (ref 0.00–0.07)
Basophils Absolute: 0.1 10*3/uL (ref 0.0–0.1)
Basophils Relative: 1 %
Eosinophils Absolute: 0.7 10*3/uL — ABNORMAL HIGH (ref 0.0–0.5)
Eosinophils Relative: 10 %
HCT: 42.8 % (ref 39.0–52.0)
Hemoglobin: 14.3 g/dL (ref 13.0–17.0)
Immature Granulocytes: 0 %
Lymphocytes Relative: 16 %
Lymphs Abs: 1 10*3/uL (ref 0.7–4.0)
MCH: 32 pg (ref 26.0–34.0)
MCHC: 33.4 g/dL (ref 30.0–36.0)
MCV: 95.7 fL (ref 80.0–100.0)
Monocytes Absolute: 0.5 10*3/uL (ref 0.1–1.0)
Monocytes Relative: 7 %
Neutro Abs: 4.1 10*3/uL (ref 1.7–7.7)
Neutrophils Relative %: 66 %
Platelet Count: 159 10*3/uL (ref 150–400)
RBC: 4.47 MIL/uL (ref 4.22–5.81)
RDW: 12.9 % (ref 11.5–15.5)
WBC Count: 6.4 10*3/uL (ref 4.0–10.5)
nRBC: 0 % (ref 0.0–0.2)

## 2022-09-05 LAB — CMP (CANCER CENTER ONLY)
ALT: 20 U/L (ref 0–44)
AST: 15 U/L (ref 15–41)
Albumin: 4.3 g/dL (ref 3.5–5.0)
Alkaline Phosphatase: 93 U/L (ref 38–126)
Anion gap: 6 (ref 5–15)
BUN: 13 mg/dL (ref 6–20)
CO2: 29 mmol/L (ref 22–32)
Calcium: 10.2 mg/dL (ref 8.9–10.3)
Chloride: 103 mmol/L (ref 98–111)
Creatinine: 0.53 mg/dL — ABNORMAL LOW (ref 0.61–1.24)
GFR, Estimated: >60 mL/min (ref >60)
Glucose, Bld: 232 mg/dL — ABNORMAL HIGH (ref 70–99)
Potassium: 4.7 mmol/L (ref 3.5–5.1)
Sodium: 138 mmol/L (ref 135–145)
Total Bilirubin: 0.4 mg/dL (ref 0.3–1.2)
Total Protein: 7.9 g/dL (ref 6.5–8.1)

## 2022-09-05 LAB — TSH: TSH: 0.783 u[IU]/mL (ref 0.350–4.500)

## 2022-09-05 MED ORDER — SODIUM CHLORIDE 0.9 % IV SOLN
517.0000 mg | Freq: Once | INTRAVENOUS | Status: AC
  Administered 2022-09-05: 520 mg via INTRAVENOUS
  Filled 2022-09-05: qty 52

## 2022-09-05 MED ORDER — SODIUM CHLORIDE 0.9 % IV SOLN
10.0000 mg | Freq: Once | INTRAVENOUS | Status: AC
  Administered 2022-09-05: 10 mg via INTRAVENOUS
  Filled 2022-09-05: qty 10

## 2022-09-05 MED ORDER — SODIUM CHLORIDE 0.9 % IV SOLN
150.0000 mg | Freq: Once | INTRAVENOUS | Status: AC
  Administered 2022-09-05: 150 mg via INTRAVENOUS
  Filled 2022-09-05: qty 150

## 2022-09-05 MED ORDER — SODIUM CHLORIDE 0.9 % IV SOLN
500.0000 mg/m2 | Freq: Once | INTRAVENOUS | Status: AC
  Administered 2022-09-05: 800 mg via INTRAVENOUS
  Filled 2022-09-05: qty 20

## 2022-09-05 MED ORDER — SODIUM CHLORIDE 0.9 % IV SOLN
200.0000 mg | Freq: Once | INTRAVENOUS | Status: AC
  Administered 2022-09-05: 200 mg via INTRAVENOUS
  Filled 2022-09-05: qty 200

## 2022-09-05 MED ORDER — SODIUM CHLORIDE 0.9 % IV SOLN: Freq: Once | INTRAVENOUS | Status: AC

## 2022-09-05 MED ORDER — PALONOSETRON HCL INJECTION 0.25 MG/5ML
0.2500 mg | Freq: Once | INTRAVENOUS | Status: AC
  Administered 2022-09-05: 0.25 mg via INTRAVENOUS
  Filled 2022-09-05: qty 5

## 2022-09-05 NOTE — Patient Instructions (Signed)
[facility]  Discharge Instructions: Thank you for choosing [facility] to provide your oncology and hematology care.   If you have a lab appointment with the [facility], please go directly to the [facility] and check in at the registration area.   Wear comfortable clothing and clothing appropriate for easy access to any Portacath or PICC line.   We strive to give you quality time with your provider. You may need to reschedule your appointment if you arrive late (15 or more minutes).  Arriving late affects you and other patients whose appointments are after yours.  Also, if you miss three or more appointments without notifying the office, you may be dismissed from the clinic at the provider's discretion.      For prescription refill requests, have your pharmacy contact our office and allow 72 hours for refills to be completed.    Today you received the following chemotherapy and/or immunotherapy agents : Keytruda, ALimta, Carboplatin      To help prevent nausea and vomiting after your treatment, we encourage you to take your nausea medication as directed.  BELOW ARE SYMPTOMS THAT SHOULD BE REPORTED IMMEDIATELY: *FEVER GREATER THAN 100.4 F (38 C) OR HIGHER *CHILLS OR SWEATING *NAUSEA AND VOMITING THAT IS NOT CONTROLLED WITH YOUR NAUSEA MEDICATION *UNUSUAL SHORTNESS OF BREATH *UNUSUAL BRUISING OR BLEEDING *URINARY PROBLEMS (pain or burning when urinating, or frequent urination) *BOWEL PROBLEMS (unusual diarrhea, constipation, pain near the anus) TENDERNESS IN MOUTH AND THROAT WITH OR WITHOUT PRESENCE OF ULCERS (sore throat, sores in mouth, or a toothache) UNUSUAL RASH, SWELLING OR PAIN  UNUSUAL VAGINAL DISCHARGE OR ITCHING   Items with * indicate a potential emergency and should be followed up as soon as possible or go to the Emergency Department if any problems should occur.  Please show the CHEMOTHERAPY ALERT CARD or IMMUNOTHERAPY ALERT  CARD at check-in to the Emergency Department and triage nurse.  Should you have questions after your visit or need to cancel or reschedule your appointment, please contact [facility]  Dept: [phone]  and follow the prompts.  Office hours are 8:00 a.m. to 4:30 p.m. Monday - Friday. Please note that voicemails left after 4:00 p.m. may not be returned until the following business day.  We are closed weekends and major holidays. You have access to a nurse at all times for urgent questions. Please call the main number to the clinic Dept: [phone] and follow the prompts.   For any non-urgent questions, you may also contact your provider using MyChart. We now offer e-Visits for anyone 30 and older to request care online for non-urgent symptoms. For details visit [email or web address].   Also download the MyChart app! Go to the app store, search "MyChart", open the app, select [facility], and log in with your MyChart username and password.  Masks are optional in the cancer centers. If you would like for your care team to wear a mask while they are taking care of you, please let them know. You may have one support person who is at least 58 years old accompany you for your appointments. Pembrolizumab Injection What is this medication? PEMBROLIZUMAB (PEM broe LIZ ue mab) treats some types of cancer. It works by helping your immune system slow or stop the spread of cancer cells. It is a monoclonal antibody. This medicine may be used for other purposes; ask your health care provider or pharmacist if you have questions. COMMON BRAND NAME(S):  Keytruda What should I tell my care team before I take this medication? They need to know if you have any of these conditions: Allogeneic stem cell transplant (uses someone else's stem cells) Autoimmune diseases, such as Crohn disease, ulcerative colitis, lupus History of chest radiation Nervous system problems, such as  Guillain-Barre syndrome, myasthenia gravis Organ transplant An unusual or allergic reaction to pembrolizumab, other medications, foods, dyes, or preservatives Pregnant or trying to get pregnant Breast-feeding How should I use this medication? This medication is injected into a vein. It is given by your care team in a hospital or clinic setting. A special MedGuide will be given to you before each treatment. Be sure to read this information carefully each time. Talk to your care team about the use of this medication in children. While it may be prescribed for children as young as 6 months for selected conditions, precautions do apply. Overdosage: If you think you have taken too much of this medicine contact a poison control center or emergency room at once. NOTE: This medicine is only for you. Do not share this medicine with others. What if I miss a dose? Keep appointments for follow-up doses. It is important not to miss your dose. Call your care team if you are unable to keep an appointment. What may interact with this medication? Interactions have not been studied. This list may not describe all possible interactions. Give your health care provider a list of all the medicines, herbs, non-prescription drugs, or dietary supplements you use. Also tell them if you smoke, drink alcohol, or use illegal drugs. Some items may interact with your medicine. What should I watch for while using this medication? Your condition will be monitored carefully while you are receiving this medication. You may need blood work while taking this medication. This medication may cause serious skin reactions. They can happen weeks to months after starting the medication. Contact your care team right away if you notice fevers or flu-like symptoms with a rash. The rash may be red or purple and then turn into blisters or peeling of the skin. You may also notice a red rash with swelling of the face, lips, or lymph nodes in your  neck or under your arms. Tell your care team right away if you have any change in your eyesight. Talk to your care team if you may be pregnant. Serious birth defects can occur if you take this medication during pregnancy and for 4 months after the last dose. You will need a negative pregnancy test before starting this medication. Contraception is recommended while taking this medication and for 4 months after the last dose. Your care team can help you find the option that works for you. Do not breastfeed while taking this medication and for 4 months after the last dose. What side effects may I notice from receiving this medication? Side effects that you should report to your care team as soon as possible: Allergic reactions--skin rash, itching, hives, swelling of the face, lips, tongue, or throat Dry cough, shortness of breath or trouble breathing Eye pain, redness, irritation, or discharge with blurry or decreased vision Heart muscle inflammation--unusual weakness or fatigue, shortness of breath, chest pain, fast or irregular heartbeat, dizziness, swelling of the ankles, feet, or hands Hormone gland problems--headache, sensitivity to light, unusual weakness or fatigue, dizziness, fast or irregular heartbeat, increased sensitivity to cold or heat, excessive sweating, constipation, hair loss, increased thirst or amount of urine, tremors or shaking, irritability Infusion reactions--chest pain, shortness  of breath or trouble breathing, feeling faint or lightheaded Kidney injury (glomerulonephritis)--decrease in the amount of urine, red or dark brown urine, foamy or bubbly urine, swelling of the ankles, hands, or feet Liver injury--right upper belly pain, loss of appetite, nausea, light-colored stool, dark yellow or brown urine, yellowing skin or eyes, unusual weakness or fatigue Pain, tingling, or numbness in the hands or feet, muscle weakness, change in vision, confusion or trouble speaking, loss of  balance or coordination, trouble walking, seizures Rash, fever, and swollen lymph nodes Redness, blistering, peeling, or loosening of the skin, including inside the mouth Sudden or severe stomach pain, bloody diarrhea, fever, nausea, vomiting Side effects that usually do not require medical attention (report to your care team if they continue or are bothersome): Bone, joint, or muscle pain Diarrhea Fatigue Loss of appetite Nausea Skin rash This list may not describe all possible side effects. Call your doctor for medical advice about side effects. You may report side effects to FDA at 1-800-FDA-1088. Where should I keep my medication? This medication is given in a hospital or clinic. It will not be stored at home. NOTE: This sheet is a summary. It may not cover all possible information. If you have questions about this medicine, talk to your doctor, pharmacist, or health care provider.  2023 Elsevier/Gold Standard (2022-02-28 00:00:00) Pemetrexed Injection What is this medication? PEMETREXED (PEM e TREX ed) treats some types of cancer. It works by slowing down the growth of cancer cells. This medicine may be used for other purposes; ask your health care provider or pharmacist if you have questions. COMMON BRAND NAME(S): Alimta, PEMFEXY What should I tell my care team before I take this medication? They need to know if you have any of these conditions: Infection, such as chickenpox, cold sores, or herpes Kidney disease Low blood cell levels (white cells, red cells, and platelets) Lung or breathing disease, such as asthma Radiation therapy An unusual or allergic reaction to pemetrexed, other medications, foods, dyes, or preservatives If you or your partner are pregnant or trying to get pregnant Breast-feeding How should I use this medication? This medication is injected into a vein. It is given by your care team in a hospital or clinic setting. Talk to your care team about the use of  this medication in children. Special care may be needed. Overdosage: If you think you have taken too much of this medicine contact a poison control center or emergency room at once. NOTE: This medicine is only for you. Do not share this medicine with others. What if I miss a dose? Keep appointments for follow-up doses. It is important not to miss your dose. Call your care team if you are unable to keep an appointment. What may interact with this medication? Do not take this medication with any of the following: Live virus vaccines This medication may also interact with the following: Ibuprofen This list may not describe all possible interactions. Give your health care provider a list of all the medicines, herbs, non-prescription drugs, or dietary supplements you use. Also tell them if you smoke, drink alcohol, or use illegal drugs. Some items may interact with your medicine. What should I watch for while using this medication? Your condition will be monitored carefully while you are receiving this medication. This medication may make you feel generally unwell. This is not uncommon as chemotherapy can affect healthy cells as well as cancer cells. Report any side effects. Continue your course of treatment even though you feel  ill unless your care team tells you to stop. This medication can cause serious side effects. To reduce the risk, your care team may give you other medications to take before receiving this one. Be sure to follow the directions from your care team. This medication can cause a rash or redness in areas of the body that have previously had radiation therapy. If you have had radiation therapy, tell your care team if you notice a rash in this area. This medication may increase your risk of getting an infection. Call your care team for advice if you get a fever, chills, sore throat, or other symptoms of a cold or flu. Do not treat yourself. Try to avoid being around people who are  sick. Be careful brushing or flossing your teeth or using a toothpick because you may get an infection or bleed more easily. If you have any dental work done, tell your dentist you are receiving this medication. Avoid taking medications that contain aspirin, acetaminophen, ibuprofen, naproxen, or ketoprofen unless instructed by your care team. These medications may hide a fever. Check with your care team if you have severe diarrhea, nausea, and vomiting, or if you sweat a lot. The loss of too much body fluid may make it dangerous for you to take this medication. Talk to your care team if you or your partner wish to become pregnant or think either of you might be pregnant. This medication can cause serious birth defects if taken during pregnancy and for 6 months after the last dose. A negative pregnancy test is required before starting this medication. A reliable form of contraception is recommended while taking this medication and for 6 months after the last dose. Talk to your care team about reliable forms of contraception. Do not father a child while taking this medication and for 3 months after the last dose. Use a condom while having sex during this time period. Do not breastfeed while taking this medication and for 1 week after the last dose. This medication may cause infertility. Talk to your care team if you are concerned about your fertility. What side effects may I notice from receiving this medication? Side effects that you should report to your care team as soon as possible: Allergic reactions--skin rash, itching, hives, swelling of the face, lips, tongue, or throat Dry cough, shortness of breath or trouble breathing Infection--fever, chills, cough, sore throat, wounds that don't heal, pain or trouble when passing urine, general feeling of discomfort or being unwell Kidney injury--decrease in the amount of urine, swelling of the ankles, hands, or feet Low red blood cell level--unusual  weakness or fatigue, dizziness, headache, trouble breathing Redness, blistering, peeling, or loosening of the skin, including inside the mouth Unusual bruising or bleeding Side effects that usually do not require medical attention (report to your care team if they continue or are bothersome): Fatigue Loss of appetite Nausea Vomiting This list may not describe all possible side effects. Call your doctor for medical advice about side effects. You may report side effects to FDA at 1-800-FDA-1088. Where should I keep my medication? This medication is given in a hospital or clinic. It will not be stored at home. NOTE: This sheet is a summary. It may not cover all possible information. If you have questions about this medicine, talk to your doctor, pharmacist, or health care provider.  2023 Elsevier/Gold Standard (2022-03-14 00:00:00) Carboplatin Injection What is this medication? CARBOPLATIN (KAR boe pla tin) treats some types of cancer. It works by  slowing down the growth of cancer cells. This medicine may be used for other purposes; ask your health care provider or pharmacist if you have questions. COMMON BRAND NAME(S): Paraplatin What should I tell my care team before I take this medication? They need to know if you have any of these conditions: Blood disorders Hearing problems Kidney disease Recent or ongoing radiation therapy An unusual or allergic reaction to carboplatin, cisplatin, other medications, foods, dyes, or preservatives Pregnant or trying to get pregnant Breast-feeding How should I use this medication? This medication is injected into a vein. It is given by your care team in a hospital or clinic setting. Talk to your care team about the use of this medication in children. Special care may be needed. Overdosage: If you think you have taken too much of this medicine contact a poison control center or emergency room at once. NOTE: This medicine is only for you. Do not share  this medicine with others. What if I miss a dose? Keep appointments for follow-up doses. It is important not to miss your dose. Call your care team if you are unable to keep an appointment. What may interact with this medication? Medications for seizures Some antibiotics, such as amikacin, gentamicin, neomycin, streptomycin, tobramycin Vaccines This list may not describe all possible interactions. Give your health care provider a list of all the medicines, herbs, non-prescription drugs, or dietary supplements you use. Also tell them if you smoke, drink alcohol, or use illegal drugs. Some items may interact with your medicine. What should I watch for while using this medication? Your condition will be monitored carefully while you are receiving this medication. You may need blood work while taking this medication. This medication may make you feel generally unwell. This is not uncommon, as chemotherapy can affect healthy cells as well as cancer cells. Report any side effects. Continue your course of treatment even though you feel ill unless your care team tells you to stop. In some cases, you may be given additional medications to help with side effects. Follow all directions for their use. This medication may increase your risk of getting an infection. Call your care team for advice if you get a fever, chills, sore throat, or other symptoms of a cold or flu. Do not treat yourself. Try to avoid being around people who are sick. Avoid taking medications that contain aspirin, acetaminophen, ibuprofen, naproxen, or ketoprofen unless instructed by your care team. These medications may hide a fever. Be careful brushing or flossing your teeth or using a toothpick because you may get an infection or bleed more easily. If you have any dental work done, tell your dentist you are receiving this medication. Talk to your care team if you wish to become pregnant or think you might be pregnant. This medication can  cause serious birth defects. Talk to your care team about effective forms of contraception. Do not breast-feed while taking this medication. What side effects may I notice from receiving this medication? Side effects that you should report to your care team as soon as possible: Allergic reactions--skin rash, itching, hives, swelling of the face, lips, tongue, or throat Infection--fever, chills, cough, sore throat, wounds that don't heal, pain or trouble when passing urine, general feeling of discomfort or being unwell Low red blood cell level--unusual weakness or fatigue, dizziness, headache, trouble breathing Pain, tingling, or numbness in the hands or feet, muscle weakness, change in vision, confusion or trouble speaking, loss of balance or coordination, trouble walking, seizures  Unusual bruising or bleeding Side effects that usually do not require medical attention (report to your care team if they continue or are bothersome): Hair loss Nausea Unusual weakness or fatigue Vomiting This list may not describe all possible side effects. Call your doctor for medical advice about side effects. You may report side effects to FDA at 1-800-FDA-1088. Where should I keep my medication? This medication is given in a hospital or clinic. It will not be stored at home. NOTE: This sheet is a summary. It may not cover all possible information. If you have questions about this medicine, talk to your doctor, pharmacist, or health care provider.  2023 Elsevier/Gold Standard (2022-03-01 00:00:00)

## 2022-09-07 ENCOUNTER — Telehealth: Payer: Self-pay

## 2022-09-07 LAB — T4: T4, Total: 10 ug/dL (ref 4.5–12.0)

## 2022-09-07 NOTE — Telephone Encounter (Signed)
Patient is feeling good and had some nausea that went away after a day.  He is a little sore all over but doing well. No diarrhea or urinary issues.  Overall he is feeling good. Gardiner Rhyme, RN

## 2022-09-07 NOTE — Telephone Encounter (Signed)
-----   Message from Scot Dock, RN sent at 09/05/2022  3:19 PM EDT ----- Regarding: [name] 1st time chemo f/u call - tolerated well

## 2022-09-09 ENCOUNTER — Other Ambulatory Visit: Payer: Self-pay | Admitting: Medical Oncology

## 2022-09-09 DIAGNOSIS — C3490 Malignant neoplasm of unspecified part of unspecified bronchus or lung: Secondary | ICD-10-CM

## 2022-09-11 ENCOUNTER — Encounter: Payer: Self-pay | Admitting: Internal Medicine

## 2022-09-12 ENCOUNTER — Inpatient Hospital Stay (HOSPITAL_BASED_OUTPATIENT_CLINIC_OR_DEPARTMENT_OTHER): Payer: Medicaid Other | Admitting: Internal Medicine

## 2022-09-12 ENCOUNTER — Encounter: Payer: Self-pay | Admitting: Internal Medicine

## 2022-09-12 ENCOUNTER — Other Ambulatory Visit: Payer: Self-pay

## 2022-09-12 ENCOUNTER — Inpatient Hospital Stay (HOSPITAL_BASED_OUTPATIENT_CLINIC_OR_DEPARTMENT_OTHER): Payer: Medicaid Other | Admitting: Nurse Practitioner

## 2022-09-12 ENCOUNTER — Inpatient Hospital Stay: Payer: Medicaid Other

## 2022-09-12 ENCOUNTER — Encounter: Payer: Self-pay | Admitting: Nurse Practitioner

## 2022-09-12 VITALS — BP 95/68 | HR 102 | Temp 97.4°F | Resp 18 | Wt 120.2 lb

## 2022-09-12 DIAGNOSIS — C7951 Secondary malignant neoplasm of bone: Secondary | ICD-10-CM | POA: Diagnosis not present

## 2022-09-12 DIAGNOSIS — C3490 Malignant neoplasm of unspecified part of unspecified bronchus or lung: Secondary | ICD-10-CM

## 2022-09-12 DIAGNOSIS — Z5111 Encounter for antineoplastic chemotherapy: Secondary | ICD-10-CM

## 2022-09-12 DIAGNOSIS — M792 Neuralgia and neuritis, unspecified: Secondary | ICD-10-CM

## 2022-09-12 DIAGNOSIS — C7931 Secondary malignant neoplasm of brain: Secondary | ICD-10-CM

## 2022-09-12 DIAGNOSIS — R530 Neoplastic (malignant) related fatigue: Secondary | ICD-10-CM

## 2022-09-12 DIAGNOSIS — G893 Neoplasm related pain (acute) (chronic): Secondary | ICD-10-CM

## 2022-09-12 DIAGNOSIS — Z5112 Encounter for antineoplastic immunotherapy: Secondary | ICD-10-CM

## 2022-09-12 DIAGNOSIS — C3491 Malignant neoplasm of unspecified part of right bronchus or lung: Secondary | ICD-10-CM

## 2022-09-12 DIAGNOSIS — Z515 Encounter for palliative care: Secondary | ICD-10-CM | POA: Diagnosis not present

## 2022-09-12 DIAGNOSIS — Z7189 Other specified counseling: Secondary | ICD-10-CM

## 2022-09-12 LAB — CMP (CANCER CENTER ONLY)
ALT: 21 U/L (ref 0–44)
AST: 13 U/L — ABNORMAL LOW (ref 15–41)
Albumin: 4.2 g/dL (ref 3.5–5.0)
Alkaline Phosphatase: 93 U/L (ref 38–126)
Anion gap: 7 (ref 5–15)
BUN: 14 mg/dL (ref 6–20)
CO2: 29 mmol/L (ref 22–32)
Calcium: 9.9 mg/dL (ref 8.9–10.3)
Chloride: 98 mmol/L (ref 98–111)
Creatinine: 0.64 mg/dL (ref 0.61–1.24)
GFR, Estimated: >60 mL/min (ref >60)
Glucose, Bld: 314 mg/dL — ABNORMAL HIGH (ref 70–99)
Potassium: 3.9 mmol/L (ref 3.5–5.1)
Sodium: 134 mmol/L — ABNORMAL LOW (ref 135–145)
Total Bilirubin: 0.5 mg/dL (ref 0.3–1.2)
Total Protein: 7.5 g/dL (ref 6.5–8.1)

## 2022-09-12 LAB — CBC WITH DIFFERENTIAL (CANCER CENTER ONLY)
Abs Immature Granulocytes: 0.02 10*3/uL (ref 0.00–0.07)
Basophils Absolute: 0 10*3/uL (ref 0.0–0.1)
Basophils Relative: 1 %
Eosinophils Absolute: 0.1 10*3/uL (ref 0.0–0.5)
Eosinophils Relative: 3 %
HCT: 39.2 % (ref 39.0–52.0)
Hemoglobin: 14 g/dL (ref 13.0–17.0)
Immature Granulocytes: 1 %
Lymphocytes Relative: 23 %
Lymphs Abs: 0.8 10*3/uL (ref 0.7–4.0)
MCH: 32 pg (ref 26.0–34.0)
MCHC: 35.7 g/dL (ref 30.0–36.0)
MCV: 89.5 fL (ref 80.0–100.0)
Monocytes Absolute: 0.3 10*3/uL (ref 0.1–1.0)
Monocytes Relative: 10 %
Neutro Abs: 2.1 10*3/uL (ref 1.7–7.7)
Neutrophils Relative %: 62 %
Platelet Count: 144 10*3/uL — ABNORMAL LOW (ref 150–400)
RBC: 4.38 MIL/uL (ref 4.22–5.81)
RDW: 11.9 % (ref 11.5–15.5)
WBC Count: 3.3 10*3/uL — ABNORMAL LOW (ref 4.0–10.5)
nRBC: 0 % (ref 0.0–0.2)

## 2022-09-12 MED ORDER — GABAPENTIN 300 MG PO CAPS: 600.0000 mg | ORAL_CAPSULE | Freq: Three times a day (TID) | ORAL | 1 refills | Status: DC

## 2022-09-12 MED ORDER — NALOXONE HCL 4 MG/0.1ML NA LIQD: 1.0000 | Freq: Once | NASAL | 0 refills | Status: AC

## 2022-09-12 MED ORDER — MORPHINE SULFATE ER 15 MG PO TBCR: 15.0000 mg | EXTENDED_RELEASE_TABLET | Freq: Two times a day (BID) | ORAL | 0 refills | Status: DC

## 2022-09-12 MED ORDER — OXYCODONE HCL 5 MG PO TABS: 5.0000 mg | ORAL_TABLET | Freq: Four times a day (QID) | ORAL | 0 refills | Status: DC | PRN

## 2022-09-12 NOTE — Progress Notes (Signed)
[facility]  [phone]) [phone] [phone]) [phone]   Name: Samuel [name] Date: 09/12/2022 MRN: [id number]  DOB: 1966-05-05  Patient Care Team: Martinique, [name], MD as PCP - General (Family Medicine) Camillia Herter, NP as Nurse Practitioner (Nurse Practitioner)    REASON FOR CONSULTATION: Samuel [name] is a 56 y.o. 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  Palliative ask to see for symptom management and goals of care.    SOCIAL HISTORY:     reports that he quit smoking about 3 weeks ago. His smoking use included cigarettes. He smoked an average of 1 pack per day. He has never used smokeless tobacco. He reports that he does not currently use alcohol after a past usage of about 50.0 standard drinks of alcohol per week. He reports that he does not use drugs.  ADVANCE DIRECTIVES:  Patient does not have an advanced directives. Education and packet provided. He has expressed interest in completing. States his sister, Samuel [name] would be his designated Scientist, research (medical).   CODE STATUS: Full code  PAST MEDICAL HISTORY: Past Medical History:  Diagnosis Date   Diabetes mellitus without complication ([place])    Metastatic cancer to brain ([place]) 08/05/2022   Metastatic cancer to spine ([place]) 07/19/2022   Pancreatitis unk   Primary lung adenocarcinoma ([place]) 08/05/2022    PAST SURGICAL HISTORY:  Past Surgical History:  Procedure Laterality Date   IR BONE TUMOR(S)RF ABLATION  08/01/2022   IR KYPHO LUMBAR INC FX REDUCE BONE BX UNI/BIL CANNULATION INC/IMAGING  08/01/2022    HEMATOLOGY/ONCOLOGY HISTORY:  Oncology History  Primary lung adenocarcinoma ([place])  08/05/2022 Initial Diagnosis   Primary lung adenocarcinoma ([place])   08/29/2022 Cancer Staging   Staging form: Lung, AJCC 8th Edition - Clinical: Stage IVB  (cT3, cN2, cM1c) - Signed by Curt Bears, MD on 08/29/2022   09/05/2022 -  Chemotherapy   Patient is on Treatment Plan : LUNG Carboplatin (5) + Pemetrexed (500) + Pembrolizumab (200) D1 q21d Induction x 4 cycles / Maintenance Pemetrexed (500) + Pembrolizumab (200) D1 q21d       ALLERGIES:  is allergic to penicillins and percocet [oxycodone-acetaminophen].  MEDICATIONS:  Current Outpatient Medications  Medication Sig Dispense Refill   acetaminophen (TYLENOL) 500 MG tablet Take 1,000 mg by mouth every 6 (six) hours as needed for mild pain or moderate pain. (Patient not taking: Reported on 08/29/2022)     atorvastatin (LIPITOR) 40 MG tablet Take 1 tablet (40 mg total) by mouth daily. (Patient not taking: Reported on 08/29/2022) 30 tablet 2   blood glucose meter kit and supplies KIT Dispense based on patient and insurance preference. Use up to four times daily as directed. 1 each 0   cyclobenzaprine (FLEXERIL) 10 MG tablet Take 1 tablet (10 mg total) by mouth 2 (two) times daily as needed for up to 20 doses for muscle spasms. (Patient not taking: Reported on 07/29/2022) 20 tablet 0   docusate sodium (COLACE) 100 MG capsule Take 1 capsule (100 mg total) by mouth 2 (two) times daily. 30 capsule 0   escitalopram (LEXAPRO) 20 MG tablet Take 1 tablet (20 mg total) by mouth daily. 30 tablet 2   Fingerstix Lancets MISC use as directed 641 each 0   folic acid (FOLVITE) 1 MG tablet Take 1 tablet (1 mg total) by mouth daily. 30 tablet  4   gabapentin (NEURONTIN) 300 MG capsule Take 1 capsule (300 mg total) by mouth 3 (three) times daily. 90 capsule 3   glucose blood test strip use as directed 100 each 0   Insulin Lispro Prot & Lispro (HUMALOG 75/25 MIX) (75-25) 100 UNIT/ML Kwikpen Inject 10 units into the skin 2 times daily with a meal 15 mL 5   Insulin Pen Needle 32G X 4 MM MISC Use with Insulin pen 100 each 3   ipratropium-albuterol (DUONEB) 0.5-2.5 (3) MG/3ML SOLN Take 3 mLs by nebulization every 6 (six)  hours as needed (Shortness of breath). 360 mL 0   oxyCODONE (OXY IR/ROXICODONE) 5 MG immediate release tablet Take 1 tablet (5 mg total) by mouth every 6 (six) hours as needed for severe pain. 120 tablet 0   polyethylene glycol powder (GLYCOLAX/MIRALAX) 17 GM/SCOOP powder Take 1 capful  (17 g) with water by mouth daily as needed. 238 g 0   prochlorperazine (COMPAZINE) 10 MG tablet Take 1 tablet (10 mg total) by mouth every 6 (six) hours as needed for nausea or vomiting. 30 tablet 0   No current facility-administered medications for this visit.    VITAL SIGNS: There were no vitals taken for this visit. There were no vitals filed for this visit.  Estimated body mass index is 18.28 kg/m as calculated from the following:   Height as of 07/28/22: _0  (1.727 m).   Weight as of an earlier encounter on 09/12/22: 120 lb 4 oz (54.5 kg).  LABS: CBC:    Component Value Date/Time   WBC 3.3 (L) 09/12/2022 0749   WBC 8.1 08/29/2022 1415   HGB 14.0 09/12/2022 0749   HGB 14.7 12/24/2020 1144   HCT 39.2 09/12/2022 0749   HCT 44.3 12/24/2020 1144   PLT 144 (L) 09/12/2022 0749   PLT 201 12/24/2020 1144   MCV 89.5 09/12/2022 0749   MCV 94 12/24/2020 1144   MCV 96 08/02/2014 1649   NEUTROABS 2.1 09/12/2022 0749   NEUTROABS 5.4 12/24/2020 1144   NEUTROABS 10.5 (H) 09/14/2012 0541   LYMPHSABS 0.8 09/12/2022 0749   LYMPHSABS 2.5 12/24/2020 1144   LYMPHSABS 1.6 09/14/2012 0541   MONOABS 0.3 09/12/2022 0749   MONOABS 1.0 09/14/2012 0541   EOSABS 0.1 09/12/2022 0749   EOSABS 0.1 12/24/2020 1144   EOSABS 0.0 09/14/2012 0541   BASOSABS 0.0 09/12/2022 0749   BASOSABS 0.0 12/24/2020 1144   BASOSABS 0.0 09/14/2012 0541   Comprehensive Metabolic Panel:    Component Value Date/Time   NA 134 (L) 09/12/2022 0749   NA 140 12/24/2020 1144   NA 142 08/02/2014 1649   K 3.9 09/12/2022 0749   K 4.2 08/02/2014 1649   CL 98 09/12/2022 0749   CL 109 (H) 08/02/2014 1649   CO2 29 09/12/2022 0749   CO2 21  08/02/2014 1649   BUN 14 09/12/2022 0749   BUN 7 12/24/2020 1144   BUN 11 08/02/2014 1649   CREATININE 0.64 09/12/2022 0749   CREATININE 0.92 08/02/2014 1649   GLUCOSE 314 (H) 09/12/2022 0749   GLUCOSE 204 (H) 08/02/2014 1649   CALCIUM 9.9 09/12/2022 0749   CALCIUM 8.6 08/02/2014 1649   AST 13 (L) 09/12/2022 0749   ALT 21 09/12/2022 0749   ALT 132 (H) 08/02/2014 1649   ALKPHOS 93 09/12/2022 0749   ALKPHOS 82 08/02/2014 1649   BILITOT 0.5 09/12/2022 0749   PROT 7.5 09/12/2022 0749   PROT 6.9 12/24/2020 1144   PROT 8.3 (H)  08/02/2014 1649   ALBUMIN 4.2 09/12/2022 0749   ALBUMIN 4.3 12/24/2020 1144   ALBUMIN 3.6 08/02/2014 1649    RADIOGRAPHIC STUDIES: No results found.  PERFORMANCE STATUS (ECOG) : 1 - Symptomatic but completely ambulatory  Review of Systems Unless otherwise noted, a complete review of systems is negative.  Physical Exam General: NAD Cardiovascular: regular rate and rhythm Pulmonary: normal breathing pattern  Extremities: no edema, no joint deformities Skin: no rashes Neurological: AAO x3, mood appropriate   IMPRESSION: This is my initial visit with Samuel [name]. He presents to clinic alone. No acute distress. In a wheelchair with brace in place. Alert and able to engage appropriately in discussions.   I introduced myself, [place] RN, and Palliative's role in collaboration with the oncology team. Concept of Palliative Care was introduced as specialized medical care for people and their families living with serious illness.  It focuses on providing relief from the symptoms and stress of a serious illness.  The goal is to improve quality of life for both the patient and the family. Values and goals of care important to patient and family were attempted to be elicited.   Samuel [name] lives in the home with his sister.  No children.  He is originally from [place].  He worked for many years in the [employer].  Samuel [name] is able to perform all his ADLs  independently.  Does require some assistance at times due to pain and fatigue.  Wears a TLSO brace for back support.  1.  Neoplasm related pain Samuel [name] endorses ongoing back which radiates down to his legs.  He shares difficulty at times getting up and moving around due to the intensity of pain.  His pain also interferes with his ability to rest.  We discussed at length his current pain regimen.  He is taking oxycodone IR 5 mg every 6 hours as needed, and gabapentin 600 mg at night, 300 mg in the daytime.  Education provided on opioid use, safety, and goals of symptom management.  Given patient's uncontrolled pain we will start him on MS Contin 15 mg in addition to his oxycodone IR.  During previous admission he was started on MS Contin 15 mg however this was not continued at discharge.  Extensive education provided on appropriate use of medication, Narcan use in the community, and expectations from patient to continue with opioid prescriptions.  Patient verbalized understanding and willingly consented to pain contract.  We will continue to closely monitor and adjust as needed.  2.  Constipation No concerns with constipation at this time.  Education provided on the use of bowel regimen in the setting of opioid use.  He is currently taking Colace twice daily acknowledging he has MiraLAX on hand.  3.  Goals of care  We discussed his current illness and what it means in the larger context of his on-going co-morbidities. Natural disease trajectory and expectations were discussed.  Samuel [name] is realistic in his understanding of recently diagnosed cancer and plan of care.He shares his frustration of having back discomfort and going to multiple urgent cares and being told to find a PCP and to take tylenol and ibuprofen. He is appreciative of the medical team here for acknowledging his discomfort and taking steps to do work-up to find out what was wrong with him. Prior to his diagnosis he was released from  prison in April 2023. Although he was not expecting it to be metastatic cancer he is at ease with knowing and being able  to pursue treatment. He is clear in his expressed wishes to treat the treatable aggressively allowing him every opportunity to thrive as much as possible.   Samuel [name] does not have a documented advanced directive.  He confirms wishes to complete.  States his sister Samuel [name] would be his Education officer, community.  We discussed advanced directive and MOLST form at length.  Patient given packet and that requires we will place a social work referral to assist with completing his advanced directives.  I discussed the importance of continued conversation with family and their medical providers regarding overall plan of care and treatment options, ensuring decisions are within the context of the patients values and GOCs.  PLAN: Established therapeutic relationship. Education provided on palliative's role in collaboration with their Oncology/Radiation team. MS Contin 15 mg every 12 hours  Oxy IR 5-10 mg every 4-6 hours as needed for breakthrough pain  Colace twice daily  Narcan for emergency use. Extensive education provided on expectations of pain management and criteria to continue receiving symptom management. Samuel [name] verbalized understanding and pain contract signed.  Referral to social work to assist with completion of advance directives. I will plan to see patient back in 2-4 weeks in collaboration to other oncology appointments.    Patient expressed understanding and was in agreement with this plan. He also understands that He can call the clinic at any time with any questions, concerns, or complaints.   Thank you for your referral and allowing Palliative to assist in Samuel [name]'s care.   Number and complexity of problems addressed: HIGH - 1 or more chronic illnesses with SEVERE exacerbation, progression, or side effects of treatment - advanced cancer, pain. Any  controlled substances utilized were prescribed in the context of palliative care.  Time Total: 55 min  Visit consisted of counseling and education dealing with the complex and emotionally intense issues of symptom management and palliative care in the setting of serious and potentially life-threatening illness.Greater than 50%  of this time was spent counseling and coordinating care related to the above assessment and plan.  Signed by: Alda Lea, AGPCNP-BC Palliative Medicine Team/[facility]

## 2022-09-12 NOTE — Progress Notes (Signed)
[facility] Telephone:(336) [phone]   Fax:(336) [phone]  OFFICE PROGRESS NOTE  Martinique, [name], MD [phone] [place] [address]  DIAGNOSIS: stage IV (T3a, N2, M1 C) non-small cell lung cancer, adenocarcinoma presented with right pulmonary nodules in addition to right hilar and mediastinal lymphadenopathy, pleural effusion, brain metastasis as well as extensive metastatic bone disease in the lumbar spines status post biopsy with kyphoplasty at the L2 lesion and brain metastasis diagnosed in September 2023.  Biomarker Findings Microsatellite status - MS-Stable Tumor Mutational Burden - 8 Muts/Mb Genomic Findings For a complete list of the genes assayed, please refer to the Appendix. KEAP1 Q227* CDKN2A/B [id number] G89V PRKCI amplification TERC amplification - equivocal? TP53 [id number]*18 8 Disease relevant genes with no reportable alterations: ALK, BRAF, EGFR, ERBB2, KRAS, MET, RET, ROS1  PDL1 TPS  90%  PRIOR THERAPY:  1) Status post palliative radiotherapy to the L2 lesion under the care of Dr. MTammi Klippel 2) Status post SRS to solitary brain metastasis under the care of Dr. MTammi Klippel CURRENT THERAPY: Palliative systemic therapy with carboplatin for AUC of 5, Alimta 500 Mg/M2 and Keytruda 200 Mg IV every 3 weeks.  First dose September 05, 2022.  INTERVAL HISTORY: Samuel [name] 56y.o. male returns to the clinic today for follow-up visit.  The patient tolerated the first week of his treatment fairly well with no concerning adverse effect except for 1 episode of nausea.  He denied having any chest pain, shortness of breath, cough or hemoptysis.  He has no nausea, vomiting, diarrhea or constipation.  He has no headache or visual changes.  He continues to have back pain with radiation to the right leg.  He is currently followed by the palliative care team.  He is here today for evaluation and repeat blood work.  MEDICAL HISTORY: Past Medical History:   Diagnosis Date   Diabetes mellitus without complication ([facility]    [place]ast[place] brain ([facility] 08/05/2022   Metastatic cancer to spine ([facility] 07/19/2022   Pancreatitis unk   Primary lung adenocarcinoma ([facility] 08/05/2022    ALLERGIES:  is allergic to penicillins and percocet [oxycodone-acetaminophen].  MEDICATIONS:  Current Outpatient Medications  Medication Sig Dispense Refill   acetaminophen (TYLENOL) 500 MG tablet Take 1,000 mg by mouth every 6 (six) hours as needed for mild pain or moderate pain. (Patient not taking: Reported on 08/29/2022)     atorvastatin (LIPITOR) 40 MG tablet Take 1 tablet (40 mg total) by mouth daily. (Patient not taking: Reported on 08/29/2022) 30 tablet 2   blood glucose meter kit and supplies KIT Dispense based on patient and insurance preference. Use up to four times daily as directed. 1 each 0   cyclobenzaprine (FLEXERIL) 10 MG tablet Take 1 tablet (10 mg total) by mouth 2 (two) times daily as needed for up to 20 doses for muscle spasms. (Patient not taking: Reported on 07/29/2022) 20 tablet 0   docusate sodium (COLACE) 100 MG capsule Take 1 capsule (100 mg total) by mouth 2 (two) times daily. 30 capsule 0   escitalopram (LEXAPRO) 20 MG tablet Take 1 tablet (20 mg total) by mouth daily. 30 tablet 2   Fingerstix Lancets MISC use as directed 1147each 0   folic acid (FOLVITE) 1 MG tablet Take 1 tablet (1 mg total) by mouth daily. 30 tablet 4   gabapentin (NEURONTIN) 300 MG capsule Take 1 capsule (300 mg total) by mouth 3 (three) times daily. 90 capsule  3   glucose blood test strip use as directed 100 each 0   Insulin Lispro Prot & Lispro (HUMALOG 75/25 MIX) (75-25) 100 UNIT/ML Kwikpen Inject 10 units into the skin 2 times daily with a meal 15 mL 5   Insulin Pen Needle 32G X 4 MM MISC Use with Insulin pen 100 each 3   ipratropium-albuterol (DUONEB) 0.5-2.5 (3) MG/3ML SOLN Take 3 mLs by nebulization every 6 (six) hours as needed (Shortness of breath). 360 mL 0    oxyCODONE (OXY IR/ROXICODONE) 5 MG immediate release tablet Take 1 tablet (5 mg total) by mouth every 6 (six) hours as needed for severe pain. 120 tablet 0   polyethylene glycol powder (GLYCOLAX/MIRALAX) 17 GM/SCOOP powder Take 1 capful  (17 g) with water by mouth daily as needed. 238 g 0   prochlorperazine (COMPAZINE) 10 MG tablet Take 1 tablet (10 mg total) by mouth every 6 (six) hours as needed for nausea or vomiting. 30 tablet 0   No current facility-administered medications for this visit.    SURGICAL HISTORY:  Past Surgical History:  Procedure Laterality Date   IR BONE TUMOR(S)RF ABLATION  08/01/2022   IR KYPHO LUMBAR INC FX REDUCE BONE BX UNI/BIL CANNULATION INC/IMAGING  08/01/2022    REVIEW OF SYSTEMS:  A comprehensive review of systems was negative except for: Constitutional: positive for fatigue Musculoskeletal: positive for back pain   PHYSICAL EXAMINATION: General appearance: alert, cooperative, fatigued, and no distress Head: Normocephalic, without obvious abnormality, atraumatic Neck: no adenopathy, no JVD, supple, symmetrical, trachea midline, and thyroid not enlarged, symmetric, no tenderness/mass/nodules Lymph nodes: Cervical, supraclavicular, and axillary nodes normal. Resp: clear to auscultation bilaterally Back: symmetric, no curvature. ROM normal. No CVA tenderness. Cardio: regular rate and rhythm, S1, S2 normal, no murmur, click, rub or gallop GI: soft, non-tender; bowel sounds normal; no masses,  no organomegaly Extremities: extremities normal, atraumatic, no cyanosis or edema  ECOG PERFORMANCE STATUS: 1 - Symptomatic but completely ambulatory  Blood pressure 95/68, pulse (!) 102, temperature (!) 97.4 F (36.3 C), temperature source Oral, resp. rate 18, weight 120 lb 4 oz (54.5 kg), SpO2 100 %.  LABORATORY DATA: Lab Results  Component Value Date   WBC 3.3 (L) 09/12/2022   HGB 14.0 09/12/2022   HCT 39.2 09/12/2022   MCV 89.5 09/12/2022   PLT 144 (L)  09/12/2022      Chemistry      Component Value Date/Time   NA 138 09/05/2022 1056   NA 140 12/24/2020 1144   NA 142 08/02/2014 1649   K 4.7 09/05/2022 1056   K 4.2 08/02/2014 1649   CL 103 09/05/2022 1056   CL 109 (H) 08/02/2014 1649   CO2 29 09/05/2022 1056   CO2 21 08/02/2014 1649   BUN 13 09/05/2022 1056   BUN 7 12/24/2020 1144   BUN 11 08/02/2014 1649   CREATININE 0.53 (L) 09/05/2022 1056   CREATININE 0.92 08/02/2014 1649      Component Value Date/Time   CALCIUM 10.2 09/05/2022 1056   CALCIUM 8.6 08/02/2014 1649   ALKPHOS 93 09/05/2022 1056   ALKPHOS 82 08/02/2014 1649   AST 15 09/05/2022 1056   ALT 20 09/05/2022 1056   ALT 132 (H) 08/02/2014 1649   BILITOT 0.4 09/05/2022 1056       RADIOGRAPHIC STUDIES: No results found.  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 He has molecular studies by foundation 1 that showed no actionable mutations and PD-L1 expression was 90%. He underwent palliative radiotherapy to the L2 vertebral body compression fraction as well as solitary brain metastasis with SRS. He is currently undergoing palliative systemic chemotherapy with carboplatin for AUC of 5, Alimta 500 Mg/M2 and Keytruda 200 Mg IV every 3 weeks.  Status post 1 cycle.  He tolerated the first week of his treatment fairly well. The patient was also given the option of treatment with single agent immunotherapy but with the KEAP1 mutation, single agent immunotherapy may not be as effective as in other patient with wild-type disease.. I recommended for the patient to  continue his treatment as planned and he is expected to start cycle #2 in 2 weeks. I will see the patient back for follow-up visit in 2 weeks for evaluation before starting cycle #2. For the insomnia, the patient will try Tylenol PM. For the pain management he is followed by the palliative care team. The patient was advised to call immediately if he has any other concerning symptoms in the interval. The patient voices understanding of current disease status and treatment options and is in agreement with the current care plan.  All questions were answered. The patient knows to call the clinic with any problems, questions or concerns. We can certainly see the patient much sooner if necessary.  The total time spent in the appointment was 20 minutes.  Disclaimer: This note was dictated with voice recognition software. Similar sounding words can inadvertently be transcribed and may not be corrected upon review.

## 2022-09-12 NOTE — Patient Instructions (Addendum)
-   increase you gabapentin to 1 pill (600mg ) 3 times a day  - continue to take your oxycodone 1 pill (5mg ) every 4-6 hours - pick up and take long acting morphine and take 1 pill (15mg ) every morning and evening (every 12 hours) - continue to take colace 2 times a day, if you become constipated take mirilax

## 2022-09-13 ENCOUNTER — Other Ambulatory Visit: Payer: Self-pay | Admitting: Radiology

## 2022-09-13 ENCOUNTER — Telehealth: Payer: Self-pay

## 2022-09-13 ENCOUNTER — Other Ambulatory Visit: Payer: Self-pay | Admitting: Internal Medicine

## 2022-09-13 MED ORDER — LIDOCAINE-PRILOCAINE 2.5-2.5 % EX CREA: TOPICAL_CREAM | CUTANEOUS | 0 refills | Status: DC

## 2022-09-13 NOTE — Telephone Encounter (Signed)
Notified Patient of prior authorization approval for Morphine Sulfate ER 15mg  Tablets. Medication is authorized through 09/13/2023. No other needs or concerns voiced at this time.

## 2022-09-14 ENCOUNTER — Inpatient Hospital Stay (HOSPITAL_COMMUNITY): Admission: RE | Admit: 2022-09-14 | Payer: Medicaid Other | Source: Ambulatory Visit

## 2022-09-14 ENCOUNTER — Ambulatory Visit (HOSPITAL_COMMUNITY): Payer: Medicaid Other

## 2022-09-16 ENCOUNTER — Other Ambulatory Visit: Payer: Self-pay

## 2022-09-16 DIAGNOSIS — C3490 Malignant neoplasm of unspecified part of unspecified bronchus or lung: Secondary | ICD-10-CM

## 2022-09-16 DIAGNOSIS — C7951 Secondary malignant neoplasm of bone: Secondary | ICD-10-CM

## 2022-09-19 ENCOUNTER — Inpatient Hospital Stay: Payer: Medicaid Other

## 2022-09-19 ENCOUNTER — Encounter: Payer: Self-pay | Admitting: Internal Medicine

## 2022-09-19 DIAGNOSIS — Z5111 Encounter for antineoplastic chemotherapy: Secondary | ICD-10-CM | POA: Diagnosis not present

## 2022-09-19 DIAGNOSIS — C3490 Malignant neoplasm of unspecified part of unspecified bronchus or lung: Secondary | ICD-10-CM

## 2022-09-19 DIAGNOSIS — C7951 Secondary malignant neoplasm of bone: Secondary | ICD-10-CM

## 2022-09-19 LAB — CBC WITH DIFFERENTIAL (CANCER CENTER ONLY)
Abs Immature Granulocytes: 0.01 10*3/uL (ref 0.00–0.07)
Basophils Absolute: 0 10*3/uL (ref 0.0–0.1)
Basophils Relative: 0 %
Eosinophils Absolute: 0 10*3/uL (ref 0.0–0.5)
Eosinophils Relative: 0 %
HCT: 33.6 % — ABNORMAL LOW (ref 39.0–52.0)
Hemoglobin: 11.8 g/dL — ABNORMAL LOW (ref 13.0–17.0)
Immature Granulocytes: 0 %
Lymphocytes Relative: 15 %
Lymphs Abs: 0.7 10*3/uL (ref 0.7–4.0)
MCH: 31.9 pg (ref 26.0–34.0)
MCHC: 35.1 g/dL (ref 30.0–36.0)
MCV: 90.8 fL (ref 80.0–100.0)
Monocytes Absolute: 0.4 10*3/uL (ref 0.1–1.0)
Monocytes Relative: 9 %
Neutro Abs: 3.5 10*3/uL (ref 1.7–7.7)
Neutrophils Relative %: 76 %
Platelet Count: 167 10*3/uL (ref 150–400)
RBC: 3.7 MIL/uL — ABNORMAL LOW (ref 4.22–5.81)
RDW: 12.7 % (ref 11.5–15.5)
WBC Count: 4.7 10*3/uL (ref 4.0–10.5)
nRBC: 0 % (ref 0.0–0.2)

## 2022-09-19 LAB — CMP (CANCER CENTER ONLY)
ALT: 23 U/L (ref 0–44)
AST: 14 U/L — ABNORMAL LOW (ref 15–41)
Albumin: 3.9 g/dL (ref 3.5–5.0)
Alkaline Phosphatase: 94 U/L (ref 38–126)
Anion gap: 6 (ref 5–15)
BUN: 12 mg/dL (ref 6–20)
CO2: 28 mmol/L (ref 22–32)
Calcium: 9.1 mg/dL (ref 8.9–10.3)
Chloride: 101 mmol/L (ref 98–111)
Creatinine: 0.59 mg/dL — ABNORMAL LOW (ref 0.61–1.24)
GFR, Estimated: >60 mL/min (ref >60)
Glucose, Bld: 370 mg/dL — ABNORMAL HIGH (ref 70–99)
Potassium: 3.4 mmol/L — ABNORMAL LOW (ref 3.5–5.1)
Sodium: 135 mmol/L (ref 135–145)
Total Bilirubin: 0.3 mg/dL (ref 0.3–1.2)
Total Protein: 6.8 g/dL (ref 6.5–8.1)

## 2022-09-19 NOTE — Progress Notes (Signed)
Pt is approved for the $1000 [employer] grant.  

## 2022-09-20 ENCOUNTER — Ambulatory Visit: Payer: Medicaid Other | Admitting: Internal Medicine

## 2022-09-20 ENCOUNTER — Other Ambulatory Visit (HOSPITAL_COMMUNITY): Payer: Self-pay | Admitting: Student

## 2022-09-20 NOTE — H&P (Signed)
Chief Complaint: Patient was seen in consultation today for metastatic non-small cell lung cancer at the request of [facility]  Referring Physician(s): Mohamed,Mohamed  Supervising Physician: Ruthann Cancer  Patient Status: [facility] - Out-pt  History of Present Illness: Samuel [name] is a 56 y.o. 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  Patient underwent palliative radiotherapy to L2 vertebral body compression fracture as well as solitary brain metastasis.  Patient is currently undergoing palliative chemotherapy.  Patient was referred to IR for tunneled catheter with port placement for chemotherapy and poor venous access.  Patient denies fever, chills, chest pain, shortness of breath, nausea or emesis. He is NPO per order. Patient complained of chronic back pain from metastatic disease.  Past Medical History:  Diagnosis Date   Diabetes mellitus without complication ([place])    Metastatic cancer to brain ([place]) 08/05/2022   Metastatic cancer to spine ([place]) 07/19/2022   Pancreatitis unk   Primary lung adenocarcinoma ([place]) 08/05/2022    Past Surgical History:  Procedure Laterality Date   IR BONE TUMOR(S)RF ABLATION  08/01/2022   IR KYPHO LUMBAR INC FX REDUCE BONE BX UNI/BIL CANNULATION INC/IMAGING  08/01/2022    Allergies: Penicillins and Percocet [oxycodone-acetaminophen]  Medications: Prior to Admission medications   Medication Sig Start Date End Date Taking? Authorizing Provider  acetaminophen (TYLENOL) 500 MG tablet Take 1,000 mg by mouth every 6 (six) hours as needed for mild pain or moderate pain. Patient not taking: Reported on 08/29/2022    [provider]  atorvastatin (LIPITOR) 40 MG tablet  Take 1 tablet (40 mg total) by mouth daily. Patient not taking: Reported on 08/29/2022 08/18/22   Oswald Hillock, MD  blood glucose meter kit and supplies KIT Dispense based on patient and insurance preference. Use up to four times daily as directed. 08/17/22   Oswald Hillock, MD  cyclobenzaprine (FLEXERIL) 10 MG tablet Take 1 tablet (10 mg total) by mouth 2 (two) times daily as needed for up to 20 doses for muscle spasms. Patient not taking: Reported on 07/29/2022 07/17/22   Wyvonnia Dusky, MD  docusate sodium (COLACE) 100 MG capsule Take 1 capsule (100 mg total) by mouth 2 (two) times daily. 08/17/22   Oswald Hillock, MD  escitalopram (LEXAPRO) 20 MG tablet Take 1 tablet (20 mg total) by mouth daily. 08/18/22   Oswald Hillock, MD  Fingerstix Lancets MISC use as directed 08/17/22   Oswald Hillock, MD  folic acid (FOLVITE) 1 MG tablet Take 1 tablet (1 mg total) by mouth daily. 08/29/22   Curt Bears, MD  gabapentin (NEURONTIN) 300 MG capsule Take 2 capsules (600 mg total) by mouth 3 (three) times daily. 09/12/22   Pickenpack-Cousar, Carlena Sax, NP  glucose blood test strip use as directed 08/17/22   Oswald Hillock, MD  Insulin Lispro Prot & Lispro (HUMALOG 75/25 MIX) (75-25) 100 UNIT/ML Kwikpen Inject 10 units into the skin 2 times daily with a meal 08/18/22   Darrick Meigs, Marge Duncans, MD  Insulin Pen Needle 32G X 4 MM MISC Use with Insulin pen 08/17/22   Oswald Hillock, MD  ipratropium-albuterol (DUONEB) 0.5-2.5 (3) MG/3ML SOLN Take 3 mLs by nebulization every 6 (six) hours as needed (Shortness of breath). 08/17/22   Oswald Hillock, MD  lidocaine-prilocaine (EMLA) cream Apply to the Port-A-Cath site 30-60 minutes before chemo. 09/13/22   Curt Bears, MD  morphine (MS CONTIN) 15 MG 12 hr tablet Take 1 tablet (15 mg total) by mouth every 12 (twelve) hours. 09/12/22   Pickenpack-Cousar, Carlena Sax, NP  oxyCODONE (OXY IR/ROXICODONE) 5 MG immediate release tablet Take 1 tablet (5 mg total) by mouth every 6 (six) hours as needed  for severe pain. 09/12/22   Pickenpack-Cousar, Carlena Sax, NP  polyethylene glycol powder (GLYCOLAX/MIRALAX) 17 GM/SCOOP powder Take 1 capful  (17 g) with water by mouth daily as needed. 08/17/22   Oswald Hillock, MD  prochlorperazine (COMPAZINE) 10 MG tablet Take 1 tablet (10 mg total) by mouth every 6 (six) hours as needed for nausea or vomiting. 08/29/22   Curt Bears, MD  insulin aspart protamine - aspart (NOVOLOG MIX 70/30 FLEXPEN) (70-30) 100 UNIT/ML FlexPen Inject 10 Units into the skin 2 (two) times daily with a meal. 08/17/22 08/17/22  Oswald Hillock, MD     History reviewed. No pertinent family history.  Social History   Socioeconomic History   Marital status: Single    Spouse name: Not on file   Number of children: Not on file   Years of education: Not on file   Highest education level: Not on file  Occupational History   Not on file  Tobacco Use   Smoking status: Former    Packs/day: 1.00    Types: Cigarettes    Quit date: 08/17/2022    Years since quitting: 0.0   Smokeless tobacco: Never  Substance and Sexual Activity   Alcohol use: Not Currently    Alcohol/week: 50.0 standard drinks of alcohol    Types: 50 Cans of beer per week    Comment: last thursday - a couple of beers   Drug use: No   Sexual activity: Yes  Other Topics Concern   Not on file  Social History Narrative   Not on file   Social Determinants of Health   Financial Resource Strain: Not on file  Food Insecurity: Not on file  Transportation Needs: Not on file  Physical Activity: Not on file  Stress: Not on file  Social Connections: Not on file    Review of Systems: A 12 point ROS discussed and pertinent positives are indicated in the HPI above.  All other systems are negative.  Review of Systems  Constitutional:  Negative for appetite change, chills, fatigue and fever.  Respiratory:  Negative for shortness of breath.   Cardiovascular:  Negative for chest pain and leg swelling.   Gastrointestinal:  Negative for abdominal pain, nausea and vomiting.  Musculoskeletal:  Positive for back pain.  Neurological:  Negative for dizziness and headaches.  Hematological:  Does not bruise/bleed easily.    Vital Signs: BP 90/63   Pulse 83   Temp 98.6 F (37 C) (Oral)   Resp 16   Ht 5' 8" (1.727 m)   Wt 120 lb 2.4 oz (54.5 kg)   SpO2 100%   BMI 18.27 kg/m     Physical Exam Vitals reviewed.  Constitutional:      General: He is not in acute distress.    Appearance: Normal appearance. He is not ill-appearing.  HENT:     Head: Normocephalic and atraumatic.     Mouth/Throat:     Mouth: Mucous membranes are dry.     Pharynx: Oropharynx is clear.  Eyes:     Extraocular Movements: Extraocular movements intact.  Pupils: Pupils are equal, round, and reactive to light.  Cardiovascular:     Rate and Rhythm: Normal rate and regular rhythm.     Pulses: Normal pulses.     Heart sounds: Normal heart sounds. No murmur heard. Pulmonary:     Effort: Pulmonary effort is normal. No respiratory distress.     Breath sounds: Normal breath sounds.  Abdominal:     General: Bowel sounds are normal. There is no distension.     Palpations: Abdomen is soft.     Tenderness: There is no abdominal tenderness. There is no guarding.  Musculoskeletal:     Right lower leg: No edema.     Left lower leg: No edema.  Skin:    General: Skin is warm and dry.  Neurological:     Mental Status: He is alert and oriented to person, place, and time.  Psychiatric:        Mood and Affect: Mood normal.        Behavior: Behavior normal.        Thought Content: Thought content normal.        Judgment: Judgment normal.     Imaging: No results found.  Labs:  CBC: Recent Labs    08/29/22 1415 09/05/22 1056 09/12/22 0749 09/19/22 1302  WBC 8.1 6.4 3.3* 4.7  HGB 13.1 14.3 14.0 11.8*  HCT 38.2* 42.8 39.2 33.6*  PLT 210 159 144* 167    COAGS: Recent Labs    07/29/22 1016  INR 1.1     BMP: Recent Labs    08/29/22 1415 09/05/22 1056 09/12/22 0749 09/19/22 1302  NA 133* 138 134* 135  K 4.9 4.7 3.9 3.4*  CL 97* 103 98 101  CO2 _0 GLUCOSE 520* 232* 314* 370*  BUN _1 CALCIUM 9.8 10.2 9.9 9.1  CREATININE 0.63 0.53* 0.64 0.59*  GFRNONAA >60 >60 >60 >60    LIVER FUNCTION TESTS: Recent Labs    08/29/22 1415 09/05/22 1056 09/12/22 0749 09/19/22 1302  BILITOT 0.3 0.4 0.5 0.3  AST 10* 15 13* 14*  ALT _2 ALKPHOS 96 93 93 94  PROT 7.8 7.9 7.5 6.8  ALBUMIN 4.1 4.3 4.2 3.9    TUMOR MARKERS: No results for input(s): "AFPTM", "CEA", "CA199", "CHROMGRNA" in the last 8760 hours.  Assessment and Plan: 56 year old male recently diagnosed with non-small cell lung cancer with brain and bone metastasis presents today for tunneled catheter with port placement with moderate sedation.  Patient resting on stretcher.   He is alert and oriented, calm and pleasant.   He is in no distress.  Risks and benefits of image guided tunneled catheter with port placement with moderate sedation was discussed with the patient including, but not limited to bleeding, infection, pneumothorax, or fibrin sheath development and need for additional procedures.  All of the patient's questions were answered, patient is agreeable to proceed. Consent signed and in chart.   Thank you for this interesting consult.  I greatly enjoyed meeting Samuel [name] and look forward to participating in their care.  A copy of this report was sent to the requesting provider on this date.  Electronically Signed: Tyson Alias, NP 09/21/2022, 12:38 PM   I spent a total of 20 minutes in face to face in clinical consultation, greater than 50% of which was counseling/coordinating care for stage IV non-small cell lung cancer.

## 2022-09-21 ENCOUNTER — Ambulatory Visit (HOSPITAL_COMMUNITY)
Admission: RE | Admit: 2022-09-21 | Discharge: 2022-09-21 | Disposition: A | Discharge status: Home / Self Care | Payer: Medicaid Other | Source: Ambulatory Visit | Attending: Internal Medicine | Admitting: Internal Medicine

## 2022-09-21 ENCOUNTER — Encounter (HOSPITAL_COMMUNITY): Payer: Self-pay

## 2022-09-21 ENCOUNTER — Inpatient Hospital Stay: Payer: Medicaid Other | Attending: Physician Assistant

## 2022-09-21 ENCOUNTER — Other Ambulatory Visit: Payer: Self-pay

## 2022-09-21 DIAGNOSIS — Z923 Personal history of irradiation: Secondary | ICD-10-CM | POA: Insufficient documentation

## 2022-09-21 DIAGNOSIS — Z79899 Other long term (current) drug therapy: Secondary | ICD-10-CM | POA: Diagnosis not present

## 2022-09-21 DIAGNOSIS — C7951 Secondary malignant neoplasm of bone: Secondary | ICD-10-CM | POA: Insufficient documentation

## 2022-09-21 DIAGNOSIS — C7931 Secondary malignant neoplasm of brain: Secondary | ICD-10-CM | POA: Insufficient documentation

## 2022-09-21 DIAGNOSIS — C3490 Malignant neoplasm of unspecified part of unspecified bronchus or lung: Secondary | ICD-10-CM | POA: Insufficient documentation

## 2022-09-21 DIAGNOSIS — R630 Anorexia: Secondary | ICD-10-CM | POA: Insufficient documentation

## 2022-09-21 DIAGNOSIS — Z5111 Encounter for antineoplastic chemotherapy: Secondary | ICD-10-CM | POA: Insufficient documentation

## 2022-09-21 DIAGNOSIS — R634 Abnormal weight loss: Secondary | ICD-10-CM | POA: Insufficient documentation

## 2022-09-21 DIAGNOSIS — Z5112 Encounter for antineoplastic immunotherapy: Secondary | ICD-10-CM | POA: Insufficient documentation

## 2022-09-21 DIAGNOSIS — I878 Other specified disorders of veins: Secondary | ICD-10-CM

## 2022-09-21 DIAGNOSIS — Z794 Long term (current) use of insulin: Secondary | ICD-10-CM | POA: Insufficient documentation

## 2022-09-21 HISTORY — PX: IR IMAGING GUIDED PORT INSERTION: IMG5740

## 2022-09-21 LAB — GLUCOSE, CAPILLARY: Glucose-Capillary: 296 mg/dL — ABNORMAL HIGH (ref 70–99)

## 2022-09-21 MED ORDER — LIDOCAINE-EPINEPHRINE 1 %-1:100000 IJ SOLN
INTRAMUSCULAR | Status: AC | PRN
  Administered 2022-09-21: 10 mL via INTRADERMAL

## 2022-09-21 MED ORDER — HEPARIN SOD (PORK) LOCK FLUSH 100 UNIT/ML IV SOLN
INTRAVENOUS | Status: AC
  Administered 2022-09-21: 5 [IU]
  Filled 2022-09-21: qty 5

## 2022-09-21 MED ORDER — SODIUM CHLORIDE 0.9 % IV SOLN: INTRAVENOUS | Status: DC

## 2022-09-21 MED ORDER — MIDAZOLAM HCL 2 MG/2ML IJ SOLN
INTRAMUSCULAR | Status: AC | PRN
  Administered 2022-09-21: 1 mg via INTRAVENOUS

## 2022-09-21 MED ORDER — FENTANYL CITRATE (PF) 100 MCG/2ML IJ SOLN
INTRAMUSCULAR | Status: AC
  Filled 2022-09-21: qty 2

## 2022-09-21 MED ORDER — LIDOCAINE-EPINEPHRINE 1 %-1:100000 IJ SOLN
INTRAMUSCULAR | Status: AC
  Filled 2022-09-21: qty 1

## 2022-09-21 MED ORDER — MIDAZOLAM HCL 2 MG/2ML IJ SOLN
INTRAMUSCULAR | Status: AC
  Filled 2022-09-21: qty 2

## 2022-09-21 MED ORDER — FENTANYL CITRATE (PF) 100 MCG/2ML IJ SOLN
INTRAMUSCULAR | Status: AC | PRN
  Administered 2022-09-21: 50 ug via INTRAVENOUS

## 2022-09-21 NOTE — Progress Notes (Signed)
Notified Hailey, Radiology PA notified about patient's blood sugar.

## 2022-09-21 NOTE — Progress Notes (Signed)
Patient was informed that IR had an emergency situation and that is the reason for the delay. He was supposed to go back about 1:30 today.  Offered another blanket, patient declined.  Dimmed the lights at his request.

## 2022-09-21 NOTE — Discharge Instructions (Signed)
Discharge Instructions:   Please call Interventional Radiology clinic [phone] with any questions or concerns.  You may remove your dressing and shower tomorrow.  Do not submerge into water.  Do not use EMLA / Lidocaine cream for 2 weeks post Port Insertion.  Implanted Port Insertion, Care After The following information offers guidance on how to care for yourself after your procedure. Your health care provider may also give you more specific instructions. If you have problems or questions, contact your health care provider. What can I expect after the procedure? After the procedure, it is common to have: Discomfort at the port insertion site. Bruising on the skin over the port. This should improve over 3-4 days. Follow these instructions at home: [facility] care After your port is placed, you will get a manufacturer's information card. The card has information about your port. Keep this card with you at all times. Take care of the port as told by your health care provider. Ask your health care provider if you or a family member can get training for taking care of the port at home. A home health care nurse will be be available to help care for the port. Make sure to remember what type of port you have. Incision care     Follow instructions from your health care provider about how to take care of your port insertion site. Make sure you: Wash your hands with soap and water for at least 20 seconds before and after you change your bandage (dressing). If soap and water are not available, use hand sanitizer. Change your dressing as told by your health care provider. Leave stitches (sutures), skin glue, or adhesive strips in place. These skin closures may need to stay in place for 2 weeks or longer. If adhesive strip edges start to loosen and curl up, you may trim the loose edges. Do not remove adhesive strips completely unless your health care provider tells you to do that. Check your port  insertion site every day for signs of infection. Check for: Redness, swelling, or pain. Fluid or blood. Warmth. Pus or a bad smell. Activity Return to your normal activities as told by your health care provider. Ask your health care provider what activities are safe for you. You may have to avoid lifting. Ask your health care provider how much you can safely lift. General instructions Take over-the-counter and prescription medicines only as told by your health care provider. Do not take baths, swim, or use a hot tub until your health care provider approves. Ask your health care provider if you may take showers. You may only be allowed to take sponge baths. If you were given a sedative during the procedure, it can affect you for several hours. Do not drive or operate machinery until your health care provider says that it is safe. Wear a medical alert bracelet in case of an emergency. This will tell any health care providers that you have a port. Keep all follow-up visits. This is important. Contact a health care provider if: You cannot flush your port with saline as directed, or you cannot draw blood from the port. You have a fever or chills. You have redness, swelling, or pain around your port insertion site. You have fluid or blood coming from your port insertion site. Your port insertion site feels warm to the touch. You have pus or a bad smell coming from the port insertion site. Get help right away if: You have chest pain or shortness of breath.  You have bleeding from your port that you cannot control. These symptoms may be an emergency. Get help right away. Call 911. Do not wait to see if the symptoms will go away. Do not drive yourself to the hospital. Summary Take care of the port as told by your health care provider. Keep the manufacturer's information card with you at all times. Change your dressing as told by your health care provider. Contact a health care provider if you  have a fever or chills or if you have redness, swelling, or pain around your port insertion site. Keep all follow-up visits. This information is not intended to replace advice given to you by your health care provider. Make sure you discuss any questions you have with your health care provider. Document Revised: 05/11/2021 Document Reviewed: 05/11/2021 Elsevier Patient Education  [place].     Moderate Conscious Sedation, Adult, Care After This sheet gives you information about how to care for yourself after your procedure. Your health care provider may also give you more specific instructions. If you have problems or questions, contact your health care provider. What can I expect after the procedure? After the procedure, it is common to have: Sleepiness for several hours. Impaired judgment for several hours. Difficulty with balance. Vomiting if you eat too soon. Follow these instructions at home: For the time period you were told by your health care provider: Rest. Do not participate in activities where you could fall or become injured. Do not drive or use machinery. Do not drink alcohol. Do not take sleeping pills or medicines that cause drowsiness. Do not make important decisions or sign legal documents. Do not take care of children on your own. Eating and drinking  Follow the diet recommended by your health care provider. Drink enough fluid to keep your urine pale yellow. If you vomit: Drink water, juice, or soup when you can drink without vomiting. Make sure you have little or no nausea before eating solid foods. General instructions Take over-the-counter and prescription medicines only as told by your health care provider. Have a responsible adult stay with you for the time you are told. It is important to have someone help care for you until you are awake and alert. Do not smoke. Keep all follow-up visits as told by your health care provider. This is  important. Contact a health care provider if: You are still sleepy or having trouble with balance after 24 hours. You feel light-headed. You keep feeling nauseous or you keep vomiting. You develop a rash. You have a fever. You have redness or swelling around the IV site. Get help right away if: You have trouble breathing. You have new-onset confusion at home. Summary After the procedure, it is common to feel sleepy, have impaired judgment, or feel nauseous if you eat too soon. Rest after you get home. Know the things you should not do after the procedure. Follow the diet recommended by your health care provider and drink enough fluid to keep your urine pale yellow. Get help right away if you have trouble breathing or new-onset confusion at home. This information is not intended to replace advice given to you by your health care provider. Make sure you discuss any questions you have with your health care provider. Document Revised: 03/06/2020 Document Reviewed: 10/03/2019 Elsevier Patient Education  [place].

## 2022-09-21 NOTE — Procedures (Signed)
Pre Procedure Dx: Poor venous access Post Procedural Dx: Same  Successful placement of right IJ approach port-a-cath with tip at the superior caval atrial junction. The catheter is ready for immediate use.  Estimated Blood Loss: Trace  Complications: None immediate.  Jay Keeley Sussman, MD Pager #: [phone]   

## 2022-09-22 ENCOUNTER — Encounter: Payer: Self-pay | Admitting: Internal Medicine

## 2022-09-23 MED FILL — Dexamethasone Sodium Phosphate Inj 100 MG/10ML: INTRAMUSCULAR | Qty: 1 | Status: AC

## 2022-09-23 MED FILL — Fosaprepitant Dimeglumine For IV Infusion 150 MG (Base Eq): INTRAVENOUS | Qty: 5 | Status: AC

## 2022-09-23 NOTE — Progress Notes (Unsigned)
[facility] OFFICE PROGRESS NOTE  Martinique, [name], MD 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   Biomarker Findings Microsatellite status - MS-Stable Tumor Mutational Burden - 8 Muts/Mb Genomic Findings For a complete list of the genes assayed, please refer to the Appendix. KEAP1 Q227* CDKN2A/B [id number] G89V PRKCI amplification TERC amplification - equivocal? TP53 [id number] 8 Disease relevant genes with no reportable alterations: ALK, BRAF, EGFR, ERBB2, KRAS, MET, RET, ROS1   PDL1 TPS  90%  PRIOR THERAPY: 1) Status post palliative radiotherapy to the L2 lesion under the care of Dr. MTammi Klippel 2) Status post SRS to solitary brain metastasis under the care of Dr. MTammi Klippel CURRENT THERAPY:  Palliative systemic therapy with carboplatin for AUC of 5, Alimta 500 Mg/M2 and Keytruda 200 Mg IV every 3 weeks.  First dose September 05, 2022.  Status post 1 cycle.   INTERVAL HISTORY: Samuel [name] 56y.o. male returns to the clinic today for a follow-up visit.  The patient was recently diagnosed with metastatic lung cancer.  He is currently undergoing palliative systemic chemotherapy and he underwent his first cycle of treatment and tolerated it well, he reports some stable fatigue since treatment. He is being followed by palliative care due to the back pain secondary to the metastatic disease to the lumbar spine.  The patient had previously completed palliative radiation to this area under the care of Dr. MTammi Klippel  He was prescribed MS Contin for pain but reportedly was not able to pick this up.  Actually, he mentions his pain is improving. He does still take oxycodone. His blood pressure tends to run a little low. He states that happens when he takes oxycodone. He also has diabetes. He reports since he had a poor appetite and was losing weight, his dose of insulin was decreased.  He is aware of signs/symptoms of hyperglycemia. He reports feeling thirsty but denies polyuria, abdominal pain, or confusion. However, the patient's appetite has improved and he gained 3 pounds.  Therefore, he has been having some more hypoglycemia recently.  He does have the capacity to check his blood sugar at home.  Otherwise he denies any other concerning complaints today.  He denies any fever, chills, drenching night sweats, unexplained weight loss.  Denies any chest pain, worsening shortness of breath, cough, or hemoptysis.  Denies any nausea, vomiting, diarrhea, or constipation.  He reports one episode of nausea a few days after treatment was it subsided with Tums. Denies any headache or visual changes.   He now has a port in place, which he states is a little tender.  The patient is here today for evaluation and repeat blood work before undergoing cycle #2.    MEDICAL HISTORY: Past Medical History:  Diagnosis Date   Diabetes mellitus without complication ([facility]    [place] [place]   [place] [place]   Pancreatitis unk   [place] 08/05/2022    ALLERGIES:  is allergic to penicillins and percocet [oxycodone-acetaminophen].  MEDICATIONS:  Current Outpatient Medications  Medication Sig Dispense Refill   acetaminophen (TYLENOL) 500 MG tablet Take 1,000 mg by  mouth every 6 (six) hours as needed for mild pain or moderate pain.     atorvastatin (LIPITOR) 40 MG tablet Take 1 tablet (40 mg total) by mouth daily. (Patient not taking: Reported on 08/29/2022) 30 tablet 2   blood glucose meter kit and supplies KIT Dispense based on patient and  insurance preference. Use up to four times daily as directed. 1 each 0   cyclobenzaprine (FLEXERIL) 10 MG tablet Take 1 tablet (10 mg total) by mouth 2 (two) times daily as needed for up to 20 doses for muscle spasms. (Patient not taking: Reported on 07/29/2022) 20 tablet 0   docusate sodium (COLACE) 100 MG capsule Take 1 capsule (100 mg total) by mouth 2 (two) times daily. 30 capsule 0   escitalopram (LEXAPRO) 20 MG tablet Take 1 tablet (20 mg total) by mouth daily. 30 tablet 2   Fingerstix Lancets MISC use as directed 858 each 0   folic acid (FOLVITE) 1 MG tablet Take 1 tablet (1 mg total) by mouth daily. 30 tablet 4   gabapentin (NEURONTIN) 300 MG capsule Take 2 capsules (600 mg total) by mouth 3 (three) times daily. 180 capsule 1   glucose blood test strip use as directed 100 each 0   Insulin Lispro Prot & Lispro (HUMALOG 75/25 MIX) (75-25) 100 UNIT/ML Kwikpen Inject 10 units into the skin 2 times daily with a meal 15 mL 5   Insulin Pen Needle 32G X 4 MM MISC Use with Insulin pen 100 each 3   ipratropium-albuterol (DUONEB) 0.5-2.5 (3) MG/3ML SOLN Take 3 mLs by nebulization every 6 (six) hours as needed (Shortness of breath). 360 mL 0   lidocaine-prilocaine (EMLA) cream Apply to the Port-A-Cath site 30-60 minutes before chemo. 30 g 0   morphine (MS CONTIN) 15 MG 12 hr tablet Take 1 tablet (15 mg total) by mouth every 12 (twelve) hours. 60 tablet 0   oxyCODONE (OXY IR/ROXICODONE) 5 MG immediate release tablet Take 1 tablet (5 mg total) by mouth every 6 (six) hours as needed for severe pain. 120 tablet 0   polyethylene glycol powder (GLYCOLAX/MIRALAX) 17 GM/SCOOP powder Take 1 capful  (17 g) with water by mouth daily as needed. 238 g 0   prochlorperazine (COMPAZINE) 10 MG tablet Take 1 tablet (10 mg total) by mouth every 6 (six) hours as needed for nausea or vomiting. 30 tablet 0   No current facility-administered medications for this visit.    SURGICAL HISTORY:  Past Surgical History:   Procedure Laterality Date   IR BONE TUMOR(S)RF ABLATION  08/01/2022   IR IMAGING GUIDED PORT INSERTION  09/21/2022   IR KYPHO LUMBAR INC FX REDUCE BONE BX UNI/BIL CANNULATION INC/IMAGING  08/01/2022    REVIEW OF SYSTEMS:   Review of Systems  Constitutional: Positive for fatigue. Negative for appetite change (improving), chills, fever and unexpected weight change.  HENT:   Negative for mouth sores, nosebleeds, sore throat and trouble swallowing.   Eyes: Negative for eye problems and icterus.  Respiratory: Negative for cough, hemoptysis, shortness of breath and wheezing.   Cardiovascular: Negative for chest pain and leg swelling.  Gastrointestinal: Negative for abdominal pain, constipation, diarrhea, nausea and vomiting.  Genitourinary: Negative for bladder incontinence, difficulty urinating, dysuria, frequency and hematuria.   Musculoskeletal: Positive for improving pain. Negative for gait problem, neck pain and neck stiffness.  Skin: Negative for itching and rash.  Neurological: Negative for dizziness, extremity weakness, gait problem, headaches, light-headedness and seizures.  Hematological: Negative for adenopathy. Does  not bruise/bleed easily.  Psychiatric/Behavioral: Negative for confusion, depression and sleep disturbance. The patient is not nervous/anxious.     PHYSICAL EXAMINATION:  Blood pressure 99/69, pulse 94, temperature 98.1 F (36.7 C), temperature source Oral, resp. rate 16, weight 123 lb 11.2 oz (56.1 kg), SpO2 100 %.  ECOG PERFORMANCE STATUS: 2  Physical Exam  Constitutional: Oriented to person, place, and time and well-developed, thin-appearing male and in no distress. HENT:  Head: Normocephalic and atraumatic.  Mouth/Throat: Oropharynx is clear and moist. No oropharyngeal exudate.  Eyes: Conjunctivae are normal. Right eye exhibits no discharge. Left eye exhibits no discharge. No scleral icterus.  Neck: Normal range of motion. Neck supple.  Cardiovascular: Normal  rate, regular rhythm, normal heart sounds and intact distal pulses.   Pulmonary/Chest: Effort normal and breath sounds normal. No respiratory distress. No wheezes. No rales.  Abdominal: Exhibits no distension and no mass.  Musculoskeletal: Exhibits no edema.  Lymphadenopathy:    No cervical adenopathy.  Neurological: Alert and oriented to person, place, and time. Exhibits normal muscle tone. Pt wearing back brace and in wheel chair  Skin: Skin is warm and dry. No rash noted. Not diaphoretic. No erythema. No pallor.  Psychiatric: Mood, memory and judgment normal.  Vitals reviewed.  LABORATORY DATA: Lab Results  Component Value Date   WBC 3.4 (L) 09/26/2022   HGB 11.6 (L) 09/26/2022   HCT 32.8 (L) 09/26/2022   MCV 93.7 09/26/2022   PLT 186 09/26/2022      Chemistry      Component Value Date/Time   NA 141 09/26/2022 0850   NA 140 12/24/2020 1144   NA 142 08/02/2014 1649   K 3.6 09/26/2022 0850   K 4.2 08/02/2014 1649   CL 106 09/26/2022 0850   CL 109 (H) 08/02/2014 1649   CO2 29 09/26/2022 0850   CO2 21 08/02/2014 1649   BUN 7 09/26/2022 0850   BUN 7 12/24/2020 1144   BUN 11 08/02/2014 1649   CREATININE 0.48 (L) 09/26/2022 0850   CREATININE 0.92 08/02/2014 1649      Component Value Date/Time   CALCIUM 9.4 09/26/2022 0850   CALCIUM 8.6 08/02/2014 1649   ALKPHOS 99 09/26/2022 0850   ALKPHOS 82 08/02/2014 1649   AST 17 09/26/2022 0850   ALT 35 09/26/2022 0850   ALT 132 (H) 08/02/2014 1649   BILITOT 0.3 09/26/2022 0850       RADIOGRAPHIC STUDIES:  IR IMAGING GUIDED PORT INSERTION  Result Date: 09/21/2022 INDICATION: Metastatic lung cancer. In need of durable intravenous access for chemotherapy administration. EXAM: IMPLANTED PORT A CATH PLACEMENT WITH ULTRASOUND AND FLUOROSCOPIC GUIDANCE COMPARISON:  PET-CT-08/04/2022 MEDICATIONS: None ANESTHESIA/SEDATION: Moderate (conscious) sedation was employed during this procedure as administered by the Interventional Radiology  RN. A total of Versed 1.5 mg and Fentanyl 75 mcg was administered intravenously. Moderate Sedation Time: 26 minutes. The patient's level of consciousness and vital signs were monitored continuously by radiology nursing throughout the procedure under my direct supervision. CONTRAST:  None FLUOROSCOPY TIME:  30 seconds (1 mGy) COMPLICATIONS: None immediate. PROCEDURE: The procedure, risks, benefits, and alternatives were explained to the patient. Questions regarding the procedure were encouraged and answered. The patient understands and consents to the procedure. The right neck and chest were prepped with chlorhexidine in a sterile fashion, and a sterile drape was applied covering the operative field. Maximum barrier sterile technique with sterile gowns and gloves were used for the procedure. A timeout was performed prior to the initiation of the  procedure. Local anesthesia was provided with 1% lidocaine with epinephrine. After creating a small venotomy incision, a micropuncture kit was utilized to access the internal jugular vein. Real-time ultrasound guidance was utilized for vascular access including the acquisition of a permanent ultrasound image documenting patency of the accessed vessel. The microwire was utilized to measure appropriate catheter length. A subcutaneous port pocket was then created along the upper chest wall utilizing a combination of sharp and blunt dissection. The pocket was irrigated with sterile saline. A single lumen Slim sized power injectable port was chosen for placement. The 8 Fr catheter was tunneled from the port pocket site to the venotomy incision. The port was placed in the pocket. The external catheter was trimmed to appropriate length. At the venotomy, an 8 Fr peel-away sheath was placed over a guidewire under fluoroscopic guidance. The catheter was then placed through the sheath and the sheath was removed. Final catheter positioning was confirmed and documented with a fluoroscopic  spot radiograph. The port was accessed with a Huber needle, aspirated and flushed with heparinized saline. The venotomy site was closed with an interrupted 4-0 Vicryl suture. The port pocket incision was closed with interrupted 2-0 Vicryl suture. Dermabond and Steri-strips were applied to both incisions. Dressings were applied. The patient tolerated the procedure well without immediate post procedural complication. FINDINGS: After catheter placement, the tip lies within the superior cavoatrial junction. The catheter aspirates and flushes normally and is ready for immediate use. IMPRESSION: Successful placement of a right internal jugular approach power injectable Port-A-Cath. The catheter is ready for immediate use. Electronically Signed   By: Sandi Mariscal M.D.   On: 09/21/2022 16:06     ASSESSMENT/PLAN:  This is a very pleasant 56 year old African-American male diagnosed with stage IV (T3, N2, M1 C) non-small cell lung cancer, adenocarcinoma.  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  He underwent biopsy and kyphoplasty to the L2 and to the brain metastasis.  He was diagnosed in September 2023.  His molecular studies showed no actionable mutations and his PD-L1 expression is 90%.  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   He underwent palliative radiation to L2 compression fracture as well as a solitary brain metastasis with SRS.  He is currently being treated with palliative systemic chemotherapy with carboplatin for an AUC of 5, Alimta 500 mg per metered square, Keytruda 200 mg IV every 3 weeks.  He status post 1 cycle and he tolerated well without any concerning adverse side effects except for one episode of nausea.     Labs were reviewed. His BS is 310.  We will administer 8 units of insulin in the clinic today.  He was advised to call his PCP today for further instructions about his insulin dose since his appetite has improved and he is eating more.  He also was instructed to monitor his blood sugar closely at home.  We reviewed signs and symptoms of hyperglycemia which she stated he was already aware of and he knows to check his blood sugar, especially when he develops any symptoms.  Recommend that he proceed with cycle #2 today scheduled.  We will see him back for follow-up visit in 3 weeks for evaluation and repeat blood work before undergoing cycle #3.  He will continue to follow with palliative care.  He is currently taking oxycodone and gabapentin for his back pain.  He was prescribed MS Contin but has not picked it up yet.  He is scheduled to see them later today.   The patient was advised to call immediately if she has any concerning symptoms in the interval. The patient voices understanding of current disease status and treatment options and is in agreement with the current care plan. All questions were answered. The patient knows to call the clinic with any problems, questions or concerns. We can certainly see the patient much sooner if necessary           No orders of the defined types were placed in this encounter.    The total time spent in the appointment was 20-29 minutes.   Cassandra L Heilingoetter, PA-C 09/26/22

## 2022-09-26 ENCOUNTER — Inpatient Hospital Stay: Payer: Medicaid Other

## 2022-09-26 ENCOUNTER — Encounter: Payer: Self-pay | Admitting: Internal Medicine

## 2022-09-26 ENCOUNTER — Other Ambulatory Visit: Payer: Self-pay | Admitting: Physician Assistant

## 2022-09-26 ENCOUNTER — Other Ambulatory Visit (HOSPITAL_COMMUNITY): Payer: Self-pay

## 2022-09-26 ENCOUNTER — Encounter: Payer: Self-pay | Admitting: Nurse Practitioner

## 2022-09-26 ENCOUNTER — Inpatient Hospital Stay (HOSPITAL_BASED_OUTPATIENT_CLINIC_OR_DEPARTMENT_OTHER): Payer: Medicaid Other | Admitting: Nurse Practitioner

## 2022-09-26 ENCOUNTER — Inpatient Hospital Stay (HOSPITAL_BASED_OUTPATIENT_CLINIC_OR_DEPARTMENT_OTHER): Payer: Medicaid Other | Admitting: Physician Assistant

## 2022-09-26 VITALS — BP 99/69 | HR 94 | Temp 98.1°F | Resp 16 | Wt 123.7 lb

## 2022-09-26 DIAGNOSIS — G893 Neoplasm related pain (acute) (chronic): Secondary | ICD-10-CM

## 2022-09-26 DIAGNOSIS — R530 Neoplastic (malignant) related fatigue: Secondary | ICD-10-CM

## 2022-09-26 DIAGNOSIS — M792 Neuralgia and neuritis, unspecified: Secondary | ICD-10-CM | POA: Diagnosis not present

## 2022-09-26 DIAGNOSIS — C7931 Secondary malignant neoplasm of brain: Secondary | ICD-10-CM | POA: Insufficient documentation

## 2022-09-26 DIAGNOSIS — C3490 Malignant neoplasm of unspecified part of unspecified bronchus or lung: Secondary | ICD-10-CM

## 2022-09-26 DIAGNOSIS — M549 Dorsalgia, unspecified: Secondary | ICD-10-CM

## 2022-09-26 DIAGNOSIS — Z5112 Encounter for antineoplastic immunotherapy: Secondary | ICD-10-CM

## 2022-09-26 DIAGNOSIS — Z515 Encounter for palliative care: Secondary | ICD-10-CM

## 2022-09-26 DIAGNOSIS — Z794 Long term (current) use of insulin: Secondary | ICD-10-CM | POA: Insufficient documentation

## 2022-09-26 DIAGNOSIS — Z79899 Other long term (current) drug therapy: Secondary | ICD-10-CM | POA: Diagnosis not present

## 2022-09-26 DIAGNOSIS — Z5111 Encounter for antineoplastic chemotherapy: Secondary | ICD-10-CM | POA: Insufficient documentation

## 2022-09-26 DIAGNOSIS — C7951 Secondary malignant neoplasm of bone: Secondary | ICD-10-CM | POA: Insufficient documentation

## 2022-09-26 DIAGNOSIS — E222 Syndrome of inappropriate secretion of antidiuretic hormone: Secondary | ICD-10-CM

## 2022-09-26 DIAGNOSIS — E119 Type 2 diabetes mellitus without complications: Secondary | ICD-10-CM | POA: Diagnosis not present

## 2022-09-26 DIAGNOSIS — R630 Anorexia: Secondary | ICD-10-CM | POA: Insufficient documentation

## 2022-09-26 DIAGNOSIS — R634 Abnormal weight loss: Secondary | ICD-10-CM | POA: Diagnosis not present

## 2022-09-26 LAB — CBC WITH DIFFERENTIAL (CANCER CENTER ONLY)
Abs Immature Granulocytes: 0.01 10*3/uL (ref 0.00–0.07)
Basophils Absolute: 0 10*3/uL (ref 0.0–0.1)
Basophils Relative: 0 %
Eosinophils Absolute: 0.1 10*3/uL (ref 0.0–0.5)
Eosinophils Relative: 2 %
HCT: 32.8 % — ABNORMAL LOW (ref 39.0–52.0)
Hemoglobin: 11.6 g/dL — ABNORMAL LOW (ref 13.0–17.0)
Immature Granulocytes: 0 %
Lymphocytes Relative: 22 %
Lymphs Abs: 0.7 10*3/uL (ref 0.7–4.0)
MCH: 33.1 pg (ref 26.0–34.0)
MCHC: 35.4 g/dL (ref 30.0–36.0)
MCV: 93.7 fL (ref 80.0–100.0)
Monocytes Absolute: 0.3 10*3/uL (ref 0.1–1.0)
Monocytes Relative: 9 %
Neutro Abs: 2.3 10*3/uL (ref 1.7–7.7)
Neutrophils Relative %: 67 %
Platelet Count: 186 10*3/uL (ref 150–400)
RBC: 3.5 MIL/uL — ABNORMAL LOW (ref 4.22–5.81)
RDW: 13.6 % (ref 11.5–15.5)
WBC Count: 3.4 10*3/uL — ABNORMAL LOW (ref 4.0–10.5)
nRBC: 0 % (ref 0.0–0.2)

## 2022-09-26 LAB — CMP (CANCER CENTER ONLY)
ALT: 35 U/L (ref 0–44)
AST: 17 U/L (ref 15–41)
Albumin: 3.9 g/dL (ref 3.5–5.0)
Alkaline Phosphatase: 99 U/L (ref 38–126)
Anion gap: 6 (ref 5–15)
BUN: 7 mg/dL (ref 6–20)
CO2: 29 mmol/L (ref 22–32)
Calcium: 9.4 mg/dL (ref 8.9–10.3)
Chloride: 106 mmol/L (ref 98–111)
Creatinine: 0.48 mg/dL — ABNORMAL LOW (ref 0.61–1.24)
GFR, Estimated: >60 mL/min (ref >60)
Glucose, Bld: 310 mg/dL — ABNORMAL HIGH (ref 70–99)
Potassium: 3.6 mmol/L (ref 3.5–5.1)
Sodium: 141 mmol/L (ref 135–145)
Total Bilirubin: 0.3 mg/dL (ref 0.3–1.2)
Total Protein: 6.8 g/dL (ref 6.5–8.1)

## 2022-09-26 MED ORDER — MORPHINE SULFATE ER 15 MG PO TBCR
15.0000 mg | EXTENDED_RELEASE_TABLET | Freq: Two times a day (BID) | ORAL | 0 refills | Status: DC
  Filled 2022-09-26: qty 60, 30d supply, fill #0

## 2022-09-26 MED ORDER — HEPARIN SOD (PORK) LOCK FLUSH 100 UNIT/ML IV SOLN
500.0000 [IU] | Freq: Once | INTRAVENOUS | Status: AC | PRN
  Administered 2022-09-26: 500 [IU]

## 2022-09-26 MED ORDER — INSULIN ASPART 100 UNIT/ML IJ SOLN
8.0000 [IU] | Freq: Once | INTRAMUSCULAR | Status: AC
  Administered 2022-09-26: 8 [IU] via SUBCUTANEOUS
  Filled 2022-09-26: qty 1

## 2022-09-26 MED ORDER — OXYCODONE HCL 5 MG PO TABS
5.0000 mg | ORAL_TABLET | Freq: Once | ORAL | Status: AC
  Administered 2022-09-26: 5 mg via ORAL
  Filled 2022-09-26: qty 1

## 2022-09-26 MED ORDER — SODIUM CHLORIDE 0.9% FLUSH
10.0000 mL | INTRAVENOUS | Status: DC | PRN
  Administered 2022-09-26: 10 mL

## 2022-09-26 MED ORDER — SODIUM CHLORIDE 0.9 % IV SOLN
200.0000 mg | Freq: Once | INTRAVENOUS | Status: AC
  Administered 2022-09-26: 200 mg via INTRAVENOUS
  Filled 2022-09-26: qty 200

## 2022-09-26 MED ORDER — SODIUM CHLORIDE 0.9 % IV SOLN
500.0000 mg/m2 | Freq: Once | INTRAVENOUS | Status: AC
  Administered 2022-09-26: 800 mg via INTRAVENOUS
  Filled 2022-09-26: qty 20

## 2022-09-26 MED ORDER — SODIUM CHLORIDE 0.9% FLUSH
10.0000 mL | Freq: Once | INTRAVENOUS | Status: AC
  Administered 2022-09-26: 10 mL via INTRAVENOUS

## 2022-09-26 MED ORDER — SODIUM CHLORIDE 0.9 % IV SOLN: Freq: Once | INTRAVENOUS | Status: AC

## 2022-09-26 MED ORDER — SODIUM CHLORIDE 0.9 % IV SOLN
517.0000 mg | Freq: Once | INTRAVENOUS | Status: AC
  Administered 2022-09-26: 520 mg via INTRAVENOUS
  Filled 2022-09-26: qty 52

## 2022-09-26 MED ORDER — ACETAMINOPHEN 325 MG PO TABS
325.0000 mg | ORAL_TABLET | Freq: Once | ORAL | Status: AC
  Administered 2022-09-26: 325 mg via ORAL
  Filled 2022-09-26: qty 1

## 2022-09-26 MED ORDER — SODIUM CHLORIDE 0.9 % IV SOLN
10.0000 mg | Freq: Once | INTRAVENOUS | Status: AC
  Administered 2022-09-26: 10 mg via INTRAVENOUS
  Filled 2022-09-26: qty 10

## 2022-09-26 MED ORDER — PALONOSETRON HCL INJECTION 0.25 MG/5ML
0.2500 mg | Freq: Once | INTRAVENOUS | Status: AC
  Administered 2022-09-26: 0.25 mg via INTRAVENOUS
  Filled 2022-09-26: qty 5

## 2022-09-26 MED ORDER — SODIUM CHLORIDE 0.9 % IV SOLN
150.0000 mg | Freq: Once | INTRAVENOUS | Status: AC
  Administered 2022-09-26: 150 mg via INTRAVENOUS
  Filled 2022-09-26: qty 150

## 2022-09-26 NOTE — Progress Notes (Signed)
[facility]  [phone] [phone]) [phone]   Name: Samuel [name] Date: 09/26/2022 MRN: [id number]  DOB: [birth year]  Patient Care Team: [place], [name], MD as PCP - General (Family Medicine) Samuel Herter, NP as Nurse Practitioner (Nurse Practitioner)    INTERVAL HISTORY: Samuel [name] is a 56 y.o. 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  Palliative ask to see for symptom management and goals of care.   SOCIAL HISTORY:     reports that he quit smoking about 5 weeks ago. His smoking use included cigarettes. He smoked an average of 1 pack per day. He has never used smokeless tobacco. He reports that he does not currently use alcohol after a past usage of about 50.0 standard drinks of alcohol per week. He reports that he does not use drugs.  ADVANCE DIRECTIVES:  Patient does not have an advanced directives. Education and packet provided. He has expressed interest in completing. States his sister, Samuel [name] would be his designated Scientist, research (medical).   CODE STATUS: Full code  PAST MEDICAL HISTORY: Past Medical History:  Diagnosis Date   Diabetes mellitus without complication ([employer])    Metastatic cancer to brain ([place]) 08/05/2022   Metastatic cancer to spine ([place]) 07/19/2022   Pancreatitis unk   Primary lung adenocarcinoma ([place]) 08/05/2022    ALLERGIES:  is allergic to penicillins and percocet [oxycodone-acetaminophen].  MEDICATIONS:  Current Outpatient Medications  Medication Sig Dispense Refill   acetaminophen (TYLENOL) 500 MG tablet Take 1,000 mg by mouth every 6 (six) hours as needed for mild pain or moderate pain.     atorvastatin (LIPITOR) 40 MG tablet Take 1 tablet (40 mg total) by mouth daily. (Patient not taking: Reported on 08/29/2022) 30 tablet 2   blood glucose meter kit and  supplies KIT Dispense based on patient and insurance preference. Use up to four times daily as directed. 1 each 0   cyclobenzaprine (FLEXERIL) 10 MG tablet Take 1 tablet (10 mg total) by mouth 2 (two) times daily as needed for up to 20 doses for muscle spasms. (Patient not taking: Reported on 07/29/2022) 20 tablet 0   docusate sodium (COLACE) 100 MG capsule Take 1 capsule (100 mg total) by mouth 2 (two) times daily. 30 capsule 0   escitalopram (LEXAPRO) 20 MG tablet Take 1 tablet (20 mg total) by mouth daily. 30 tablet 2   Fingerstix Lancets MISC use as directed 827 each 0   folic acid (FOLVITE) 1 MG tablet Take 1 tablet (1 mg total) by mouth daily. 30 tablet 4   gabapentin (NEURONTIN) 300 MG capsule Take 2 capsules (600 mg total) by mouth 3 (three) times daily. 180 capsule 1   glucose blood test strip use as directed 100 each 0   Insulin Lispro Prot & Lispro (HUMALOG 75/25 MIX) (75-25) 100 UNIT/ML Kwikpen Inject 10 units into the skin 2 times daily with a meal 15 mL 5   Insulin Pen Needle 32G X 4 MM MISC Use with Insulin pen 100 each 3   ipratropium-albuterol (DUONEB) 0.5-2.5 (3) MG/3ML SOLN Take 3 mLs by nebulization every 6 (six) hours as needed (Shortness of breath). 360 mL 0   lidocaine-prilocaine (EMLA) cream Apply to the Port-A-Cath site 30-60 minutes before chemo. 30 g 0   morphine (MS CONTIN) 15 MG 12 hr tablet Take 1 tablet (15  mg total) by mouth every 12 (twelve) hours. 60 tablet 0   oxyCODONE (OXY IR/ROXICODONE) 5 MG immediate release tablet Take 1 tablet (5 mg total) by mouth every 6 (six) hours as needed for severe pain. 120 tablet 0   polyethylene glycol powder (GLYCOLAX/MIRALAX) 17 GM/SCOOP powder Take 1 capful  (17 g) with water by mouth daily as needed. 238 g 0   prochlorperazine (COMPAZINE) 10 MG tablet Take 1 tablet (10 mg total) by mouth every 6 (six) hours as needed for nausea or vomiting. 30 tablet 0   No current facility-administered medications for this visit.    Facility-Administered Medications Ordered in Other Visits  Medication Dose Route Frequency Provider Last Rate Last Admin   sodium chloride flush (NS) 0.9 % injection 10 mL  10 mL Intracatheter PRN Curt Bears, MD   10 mL at 09/26/22 1338    VITAL SIGNS: There were no vitals taken for this visit. There were no vitals filed for this visit.  Estimated body mass index is 18.81 kg/m as calculated from the following:   Height as of 09/21/22: _0  (1.727 m).   Weight as of an earlier encounter on 09/26/22: 123 lb 11.2 oz (56.1 kg).   PERFORMANCE STATUS (ECOG) : 1 - Symptomatic but completely ambulatory   Physical Exam General: NAD Cardiovascular: regular rate and rhythm Pulmonary: clear ant fields Abdomen: soft, nontender, + bowel sounds GU: no suprapubic tenderness Extremities: no edema, no joint deformities Skin: no rashes Neurological: AAO x3  IMPRESSION: I saw [name] during his infusion today. No acute distress noted. He is tolerating treatments. States his appetite has improved which he is appreciative of. Denies concerns with constipation, diarrhea, nausea, or vomiting.   Neoplasm related pain [name] states his pain has improved some however continues to be worst in the morning and later part of the days. Pain will cause increase difficulty in ambulating.  Unfortunately he has not been able to pick up his MS Contin 15 mg that I previously prescribed due to pharmacy concerns.  I spoke personally with the pharmacist who stated they did not fill his prescription due to patient also having a prescription actively being filled for oxycodone IR.  I explained to pharmacist patient is to be on MS Contin in addition to oxycodone IR as one is for long-acting and the other is short acting.  They verbalized understanding expressing patient will be able to get prescription filled now that they had clarification but unfortunately they do not have in stock and it would take 3-4 days to  obtain.  Prescription has been sent to [facility] outpatient pharmacy to be filled today.  Education provided again on the use of MS Contin 15 mg including dosage, frequency, and safe use.  Patient verbalized understanding.  We will continue to closely monitor and adjust medications as needed.  Patient pain contract was signed at previous visit on 10/23.  PLAN: MS Contin 15 mg every 12 hours Oxy IR 5-10 mg every 4-6 hours as needed for breakthrough pain Colace twice daily Narcan for emergency use Pain contract signed on 10/23. I will plan to see patient back in 2-4 weeks in collaboration with his other oncology appointments.   Patient expressed understanding and was in agreement with this plan. He also understands that He can call the clinic at any time with any questions, concerns, or complaints.   Any controlled substances utilized were prescribed in the context of palliative care. PDMP has been reviewed.   Time  Total: 25 min   Visit consisted of counseling and education dealing with the complex and emotionally intense issues of symptom management and palliative care in the setting of serious and potentially life-threatening illness.Greater than 50%  of this time was spent counseling and coordinating care related to the above assessment and plan.  Alda Lea, AGPCNP-BC  Palliative Medicine Team/[facility]

## 2022-09-26 NOTE — Patient Instructions (Signed)
[facility]  Discharge Instructions: Thank you for choosing [facility] to provide your oncology and hematology care.   If you have a lab appointment with the [facility], please go directly to the [facility] and check in at the registration area.   Wear comfortable clothing and clothing appropriate for easy access to any Portacath or PICC line.   We strive to give you quality time with your provider. You may need to reschedule your appointment if you arrive late (15 or more minutes).  Arriving late affects you and other patients whose appointments are after yours.  Also, if you miss three or more appointments without notifying the office, you may be dismissed from the clinic at the provider's discretion.      For prescription refill requests, have your pharmacy contact our office and allow 72 hours for refills to be completed.    Today you received the following chemotherapy and/or immunotherapy agents : Keytruda, ALimta, Carboplatin      To help prevent nausea and vomiting after your treatment, we encourage you to take your nausea medication as directed.  BELOW ARE SYMPTOMS THAT SHOULD BE REPORTED IMMEDIATELY: *FEVER GREATER THAN 100.4 F (38 C) OR HIGHER *CHILLS OR SWEATING *NAUSEA AND VOMITING THAT IS NOT CONTROLLED WITH YOUR NAUSEA MEDICATION *UNUSUAL SHORTNESS OF BREATH *UNUSUAL BRUISING OR BLEEDING *URINARY PROBLEMS (pain or burning when urinating, or frequent urination) *BOWEL PROBLEMS (unusual diarrhea, constipation, pain near the anus) TENDERNESS IN MOUTH AND THROAT WITH OR WITHOUT PRESENCE OF ULCERS (sore throat, sores in mouth, or a toothache) UNUSUAL RASH, SWELLING OR PAIN  UNUSUAL VAGINAL DISCHARGE OR ITCHING   Items with * indicate a potential emergency and should be followed up as soon as possible or go to the Emergency Department if any problems should occur.  Please show the CHEMOTHERAPY ALERT CARD or IMMUNOTHERAPY ALERT  CARD at check-in to the Emergency Department and triage nurse.  Should you have questions after your visit or need to cancel or reschedule your appointment, please contact [facility]  Dept: [phone]  and follow the prompts.  Office hours are 8:00 a.m. to 4:30 p.m. Monday - Friday. Please note that voicemails left after 4:00 p.m. may not be returned until the following business day.  We are closed weekends and major holidays. You have access to a nurse at all times for urgent questions. Please call the main number to the clinic Dept: [phone] and follow the prompts.   For any non-urgent questions, you may also contact your provider using MyChart. We now offer e-Visits for anyone 58 and older to request care online for non-urgent symptoms. For details visit [email or web address].   Also download the MyChart app! Go to the app store, search "MyChart", open the app, select [facility], and log in with your MyChart username and password.  Masks are optional in the cancer centers. If you would like for your care team to wear a mask while they are taking care of you, please let them know. You may have one support person who is at least 58 years old accompany you for your appointments. Pembrolizumab Injection What is this medication? PEMBROLIZUMAB (PEM broe LIZ ue mab) treats some types of cancer. It works by helping your immune system slow or stop the spread of cancer cells. It is a monoclonal antibody. This medicine may be used for other purposes; ask your health care provider or pharmacist if you have questions. COMMON BRAND NAME(S):  Keytruda What should I tell my care team before I take this medication? They need to know if you have any of these conditions: Allogeneic stem cell transplant (uses someone else's stem cells) Autoimmune diseases, such as Crohn disease, ulcerative colitis, lupus History of chest radiation Nervous system problems, such as  Guillain-Barre syndrome, myasthenia gravis Organ transplant An unusual or allergic reaction to pembrolizumab, other medications, foods, dyes, or preservatives Pregnant or trying to get pregnant Breast-feeding How should I use this medication? This medication is injected into a vein. It is given by your care team in a hospital or clinic setting. A special MedGuide will be given to you before each treatment. Be sure to read this information carefully each time. Talk to your care team about the use of this medication in children. While it may be prescribed for children as young as 6 months for selected conditions, precautions do apply. Overdosage: If you think you have taken too much of this medicine contact a poison control center or emergency room at once. NOTE: This medicine is only for you. Do not share this medicine with others. What if I miss a dose? Keep appointments for follow-up doses. It is important not to miss your dose. Call your care team if you are unable to keep an appointment. What may interact with this medication? Interactions have not been studied. This list may not describe all possible interactions. Give your health care provider a list of all the medicines, herbs, non-prescription drugs, or dietary supplements you use. Also tell them if you smoke, drink alcohol, or use illegal drugs. Some items may interact with your medicine. What should I watch for while using this medication? Your condition will be monitored carefully while you are receiving this medication. You may need blood work while taking this medication. This medication may cause serious skin reactions. They can happen weeks to months after starting the medication. Contact your care team right away if you notice fevers or flu-like symptoms with a rash. The rash may be red or purple and then turn into blisters or peeling of the skin. You may also notice a red rash with swelling of the face, lips, or lymph nodes in your  neck or under your arms. Tell your care team right away if you have any change in your eyesight. Talk to your care team if you may be pregnant. Serious birth defects can occur if you take this medication during pregnancy and for 4 months after the last dose. You will need a negative pregnancy test before starting this medication. Contraception is recommended while taking this medication and for 4 months after the last dose. Your care team can help you find the option that works for you. Do not breastfeed while taking this medication and for 4 months after the last dose. What side effects may I notice from receiving this medication? Side effects that you should report to your care team as soon as possible: Allergic reactions--skin rash, itching, hives, swelling of the face, lips, tongue, or throat Dry cough, shortness of breath or trouble breathing Eye pain, redness, irritation, or discharge with blurry or decreased vision Heart muscle inflammation--unusual weakness or fatigue, shortness of breath, chest pain, fast or irregular heartbeat, dizziness, swelling of the ankles, feet, or hands Hormone gland problems--headache, sensitivity to light, unusual weakness or fatigue, dizziness, fast or irregular heartbeat, increased sensitivity to cold or heat, excessive sweating, constipation, hair loss, increased thirst or amount of urine, tremors or shaking, irritability Infusion reactions--chest pain, shortness  of breath or trouble breathing, feeling faint or lightheaded Kidney injury (glomerulonephritis)--decrease in the amount of urine, red or dark brown urine, foamy or bubbly urine, swelling of the ankles, hands, or feet Liver injury--right upper belly pain, loss of appetite, nausea, light-colored stool, dark yellow or brown urine, yellowing skin or eyes, unusual weakness or fatigue Pain, tingling, or numbness in the hands or feet, muscle weakness, change in vision, confusion or trouble speaking, loss of  balance or coordination, trouble walking, seizures Rash, fever, and swollen lymph nodes Redness, blistering, peeling, or loosening of the skin, including inside the mouth Sudden or severe stomach pain, bloody diarrhea, fever, nausea, vomiting Side effects that usually do not require medical attention (report to your care team if they continue or are bothersome): Bone, joint, or muscle pain Diarrhea Fatigue Loss of appetite Nausea Skin rash This list may not describe all possible side effects. Call your doctor for medical advice about side effects. You may report side effects to FDA at 1-800-FDA-1088. Where should I keep my medication? This medication is given in a hospital or clinic. It will not be stored at home. NOTE: This sheet is a summary. It may not cover all possible information. If you have questions about this medicine, talk to your doctor, pharmacist, or health care provider.  2023 Elsevier/Gold Standard (2022-02-28 00:00:00) Pemetrexed Injection What is this medication? PEMETREXED (PEM e TREX ed) treats some types of cancer. It works by slowing down the growth of cancer cells. This medicine may be used for other purposes; ask your health care provider or pharmacist if you have questions. COMMON BRAND NAME(S): Alimta, PEMFEXY What should I tell my care team before I take this medication? They need to know if you have any of these conditions: Infection, such as chickenpox, cold sores, or herpes Kidney disease Low blood cell levels (white cells, red cells, and platelets) Lung or breathing disease, such as asthma Radiation therapy An unusual or allergic reaction to pemetrexed, other medications, foods, dyes, or preservatives If you or your partner are pregnant or trying to get pregnant Breast-feeding How should I use this medication? This medication is injected into a vein. It is given by your care team in a hospital or clinic setting. Talk to your care team about the use of  this medication in children. Special care may be needed. Overdosage: If you think you have taken too much of this medicine contact a poison control center or emergency room at once. NOTE: This medicine is only for you. Do not share this medicine with others. What if I miss a dose? Keep appointments for follow-up doses. It is important not to miss your dose. Call your care team if you are unable to keep an appointment. What may interact with this medication? Do not take this medication with any of the following: Live virus vaccines This medication may also interact with the following: Ibuprofen This list may not describe all possible interactions. Give your health care provider a list of all the medicines, herbs, non-prescription drugs, or dietary supplements you use. Also tell them if you smoke, drink alcohol, or use illegal drugs. Some items may interact with your medicine. What should I watch for while using this medication? Your condition will be monitored carefully while you are receiving this medication. This medication may make you feel generally unwell. This is not uncommon as chemotherapy can affect healthy cells as well as cancer cells. Report any side effects. Continue your course of treatment even though you feel  ill unless your care team tells you to stop. This medication can cause serious side effects. To reduce the risk, your care team may give you other medications to take before receiving this one. Be sure to follow the directions from your care team. This medication can cause a rash or redness in areas of the body that have previously had radiation therapy. If you have had radiation therapy, tell your care team if you notice a rash in this area. This medication may increase your risk of getting an infection. Call your care team for advice if you get a fever, chills, sore throat, or other symptoms of a cold or flu. Do not treat yourself. Try to avoid being around people who are  sick. Be careful brushing or flossing your teeth or using a toothpick because you may get an infection or bleed more easily. If you have any dental work done, tell your dentist you are receiving this medication. Avoid taking medications that contain aspirin, acetaminophen, ibuprofen, naproxen, or ketoprofen unless instructed by your care team. These medications may hide a fever. Check with your care team if you have severe diarrhea, nausea, and vomiting, or if you sweat a lot. The loss of too much body fluid may make it dangerous for you to take this medication. Talk to your care team if you or your partner wish to become pregnant or think either of you might be pregnant. This medication can cause serious birth defects if taken during pregnancy and for 6 months after the last dose. A negative pregnancy test is required before starting this medication. A reliable form of contraception is recommended while taking this medication and for 6 months after the last dose. Talk to your care team about reliable forms of contraception. Do not father a child while taking this medication and for 3 months after the last dose. Use a condom while having sex during this time period. Do not breastfeed while taking this medication and for 1 week after the last dose. This medication may cause infertility. Talk to your care team if you are concerned about your fertility. What side effects may I notice from receiving this medication? Side effects that you should report to your care team as soon as possible: Allergic reactions--skin rash, itching, hives, swelling of the face, lips, tongue, or throat Dry cough, shortness of breath or trouble breathing Infection--fever, chills, cough, sore throat, wounds that don't heal, pain or trouble when passing urine, general feeling of discomfort or being unwell Kidney injury--decrease in the amount of urine, swelling of the ankles, hands, or feet Low red blood cell level--unusual  weakness or fatigue, dizziness, headache, trouble breathing Redness, blistering, peeling, or loosening of the skin, including inside the mouth Unusual bruising or bleeding Side effects that usually do not require medical attention (report to your care team if they continue or are bothersome): Fatigue Loss of appetite Nausea Vomiting This list may not describe all possible side effects. Call your doctor for medical advice about side effects. You may report side effects to FDA at 1-800-FDA-1088. Where should I keep my medication? This medication is given in a hospital or clinic. It will not be stored at home. NOTE: This sheet is a summary. It may not cover all possible information. If you have questions about this medicine, talk to your doctor, pharmacist, or health care provider.  2023 Elsevier/Gold Standard (2022-03-14 00:00:00) Carboplatin Injection What is this medication? CARBOPLATIN (KAR boe pla tin) treats some types of cancer. It works by  slowing down the growth of cancer cells. This medicine may be used for other purposes; ask your health care provider or pharmacist if you have questions. COMMON BRAND NAME(S): Paraplatin What should I tell my care team before I take this medication? They need to know if you have any of these conditions: Blood disorders Hearing problems Kidney disease Recent or ongoing radiation therapy An unusual or allergic reaction to carboplatin, cisplatin, other medications, foods, dyes, or preservatives Pregnant or trying to get pregnant Breast-feeding How should I use this medication? This medication is injected into a vein. It is given by your care team in a hospital or clinic setting. Talk to your care team about the use of this medication in children. Special care may be needed. Overdosage: If you think you have taken too much of this medicine contact a poison control center or emergency room at once. NOTE: This medicine is only for you. Do not share  this medicine with others. What if I miss a dose? Keep appointments for follow-up doses. It is important not to miss your dose. Call your care team if you are unable to keep an appointment. What may interact with this medication? Medications for seizures Some antibiotics, such as amikacin, gentamicin, neomycin, streptomycin, tobramycin Vaccines This list may not describe all possible interactions. Give your health care provider a list of all the medicines, herbs, non-prescription drugs, or dietary supplements you use. Also tell them if you smoke, drink alcohol, or use illegal drugs. Some items may interact with your medicine. What should I watch for while using this medication? Your condition will be monitored carefully while you are receiving this medication. You may need blood work while taking this medication. This medication may make you feel generally unwell. This is not uncommon, as chemotherapy can affect healthy cells as well as cancer cells. Report any side effects. Continue your course of treatment even though you feel ill unless your care team tells you to stop. In some cases, you may be given additional medications to help with side effects. Follow all directions for their use. This medication may increase your risk of getting an infection. Call your care team for advice if you get a fever, chills, sore throat, or other symptoms of a cold or flu. Do not treat yourself. Try to avoid being around people who are sick. Avoid taking medications that contain aspirin, acetaminophen, ibuprofen, naproxen, or ketoprofen unless instructed by your care team. These medications may hide a fever. Be careful brushing or flossing your teeth or using a toothpick because you may get an infection or bleed more easily. If you have any dental work done, tell your dentist you are receiving this medication. Talk to your care team if you wish to become pregnant or think you might be pregnant. This medication can  cause serious birth defects. Talk to your care team about effective forms of contraception. Do not breast-feed while taking this medication. What side effects may I notice from receiving this medication? Side effects that you should report to your care team as soon as possible: Allergic reactions--skin rash, itching, hives, swelling of the face, lips, tongue, or throat Infection--fever, chills, cough, sore throat, wounds that don't heal, pain or trouble when passing urine, general feeling of discomfort or being unwell Low red blood cell level--unusual weakness or fatigue, dizziness, headache, trouble breathing Pain, tingling, or numbness in the hands or feet, muscle weakness, change in vision, confusion or trouble speaking, loss of balance or coordination, trouble walking, seizures  Unusual bruising or bleeding Side effects that usually do not require medical attention (report to your care team if they continue or are bothersome): Hair loss Nausea Unusual weakness or fatigue Vomiting This list may not describe all possible side effects. Call your doctor for medical advice about side effects. You may report side effects to FDA at 1-800-FDA-1088. Where should I keep my medication? This medication is given in a hospital or clinic. It will not be stored at home. NOTE: This sheet is a summary. It may not cover all possible information. If you have questions about this medicine, talk to your doctor, pharmacist, or health care provider.  2023 Elsevier/Gold Standard (2022-03-01 00:00:00)

## 2022-09-26 NOTE — Progress Notes (Signed)
Ok to treat today with SBP 99 per Cassie H, PA. Novolog for blood sugar of 310.  Patient blood sugar recheck was 248 after insulin.

## 2022-09-27 ENCOUNTER — Ambulatory Visit: Payer: Medicaid Other

## 2022-09-29 ENCOUNTER — Telehealth: Payer: Self-pay | Admitting: Physician Assistant

## 2022-09-29 NOTE — Telephone Encounter (Signed)
Called patient regarding upcoming November-January appointments, scheduled per work-queue and los.

## 2022-09-30 ENCOUNTER — Other Ambulatory Visit: Payer: Self-pay | Admitting: *Deleted

## 2022-09-30 DIAGNOSIS — C7951 Secondary malignant neoplasm of bone: Secondary | ICD-10-CM

## 2022-10-03 ENCOUNTER — Inpatient Hospital Stay: Payer: Medicaid Other

## 2022-10-03 ENCOUNTER — Other Ambulatory Visit: Payer: Self-pay

## 2022-10-03 DIAGNOSIS — M792 Neuralgia and neuritis, unspecified: Secondary | ICD-10-CM

## 2022-10-03 DIAGNOSIS — Z5111 Encounter for antineoplastic chemotherapy: Secondary | ICD-10-CM | POA: Diagnosis not present

## 2022-10-03 DIAGNOSIS — C7951 Secondary malignant neoplasm of bone: Secondary | ICD-10-CM

## 2022-10-03 DIAGNOSIS — Z515 Encounter for palliative care: Secondary | ICD-10-CM

## 2022-10-03 DIAGNOSIS — C3490 Malignant neoplasm of unspecified part of unspecified bronchus or lung: Secondary | ICD-10-CM

## 2022-10-03 DIAGNOSIS — Z95828 Presence of other vascular implants and grafts: Secondary | ICD-10-CM

## 2022-10-03 DIAGNOSIS — G893 Neoplasm related pain (acute) (chronic): Secondary | ICD-10-CM

## 2022-10-03 LAB — CBC WITH DIFFERENTIAL (CANCER CENTER ONLY)
Abs Immature Granulocytes: 0.01 10*3/uL (ref 0.00–0.07)
Basophils Absolute: 0 10*3/uL (ref 0.0–0.1)
Basophils Relative: 1 %
Eosinophils Absolute: 0 10*3/uL (ref 0.0–0.5)
Eosinophils Relative: 1 %
HCT: 32.9 % — ABNORMAL LOW (ref 39.0–52.0)
Hemoglobin: 11.8 g/dL — ABNORMAL LOW (ref 13.0–17.0)
Immature Granulocytes: 1 %
Lymphocytes Relative: 42 %
Lymphs Abs: 0.9 10*3/uL (ref 0.7–4.0)
MCH: 33 pg (ref 26.0–34.0)
MCHC: 35.9 g/dL (ref 30.0–36.0)
MCV: 91.9 fL (ref 80.0–100.0)
Monocytes Absolute: 0.3 10*3/uL (ref 0.1–1.0)
Monocytes Relative: 13 %
Neutro Abs: 0.9 10*3/uL — ABNORMAL LOW (ref 1.7–7.7)
Neutrophils Relative %: 42 %
Platelet Count: 117 10*3/uL — ABNORMAL LOW (ref 150–400)
RBC: 3.58 MIL/uL — ABNORMAL LOW (ref 4.22–5.81)
RDW: 12.7 % (ref 11.5–15.5)
Smear Review: NORMAL
WBC Count: 2.1 10*3/uL — ABNORMAL LOW (ref 4.0–10.5)
nRBC: 0 % (ref 0.0–0.2)

## 2022-10-03 LAB — CMP (CANCER CENTER ONLY)
ALT: 29 U/L (ref 0–44)
AST: 12 U/L — ABNORMAL LOW (ref 15–41)
Albumin: 4.2 g/dL (ref 3.5–5.0)
Alkaline Phosphatase: 98 U/L (ref 38–126)
Anion gap: 5 (ref 5–15)
BUN: 11 mg/dL (ref 6–20)
CO2: 32 mmol/L (ref 22–32)
Calcium: 9.7 mg/dL (ref 8.9–10.3)
Chloride: 99 mmol/L (ref 98–111)
Creatinine: 0.51 mg/dL — ABNORMAL LOW (ref 0.61–1.24)
GFR, Estimated: >60 mL/min (ref >60)
Glucose, Bld: 439 mg/dL — ABNORMAL HIGH (ref 70–99)
Potassium: 3.4 mmol/L — ABNORMAL LOW (ref 3.5–5.1)
Sodium: 136 mmol/L (ref 135–145)
Total Bilirubin: 0.3 mg/dL (ref 0.3–1.2)
Total Protein: 7.2 g/dL (ref 6.5–8.1)

## 2022-10-03 MED ORDER — OXYCODONE HCL 5 MG PO TABS: 5.0000 mg | ORAL_TABLET | ORAL | 0 refills | Status: DC | PRN

## 2022-10-03 MED ORDER — HEPARIN SOD (PORK) LOCK FLUSH 100 UNIT/ML IV SOLN
500.0000 [IU] | INTRAVENOUS | Status: AC | PRN
  Administered 2022-10-03: 500 [IU]

## 2022-10-03 MED ORDER — SODIUM CHLORIDE 0.9% FLUSH
10.0000 mL | INTRAVENOUS | Status: AC | PRN
  Administered 2022-10-03: 10 mL

## 2022-10-03 NOTE — Telephone Encounter (Signed)
Per flush room LPN, pt requested refill of pain medications, see new orders.

## 2022-10-04 ENCOUNTER — Telehealth: Payer: Self-pay

## 2022-10-04 NOTE — Telephone Encounter (Signed)
Pt called saying he was unable to pick up his oxycodone from the pharmacy d/t a PA issue. PA issue resolved and confirmed with pharmacy that pt would be able to pick up medication today. Pt informed and verbalized understanding, no further needs at this time.

## 2022-10-05 ENCOUNTER — Encounter: Payer: Self-pay | Admitting: Urology

## 2022-10-05 ENCOUNTER — Encounter: Payer: Self-pay | Admitting: Internal Medicine

## 2022-10-05 NOTE — Progress Notes (Signed)
  Radiation Oncology         [phone] ________________________________  Name: Samuel [name] MRN: [id number]  Date of Service: 10/06/2022  DOB: 1966/01/06  Post Treatment Telephone Note  Diagnosis: Metastatic cancer to brain (as documented in provider EOT note)   The patient was available for call today.  The patient did  note fatigue during radiation that has improved. The patient did not note hair loss or skin changes in the field of radiation during therapy. The patient is not taking dexamethasone. The patient does not have symptoms of weakness or loss of control of the extremities. The patient does not have symptoms of headache but does have some occasional moderate dizziness. The patient does not have symptoms of seizure or uncontrolled movement. The patient does not have symptoms of changes in vision. The patient does not have changes in speech. The patient does not have confusion.   The patient was counseled that he  will be contacted by our brain and spine navigator to schedule surveillance imaging. The patient was encouraged to call if  he  have not received a call to schedule imaging, or if he  develop concerns or questions regarding radiation. The patient will also continue to follow up with Dr. Curt Bears in medical oncology.  This concludes the nursing interview.  Leandra Kern, LPN

## 2022-10-05 NOTE — Progress Notes (Signed)
                                                                                                                                                             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  Plan: The patient will receive a call in about one month from the radiation oncology department. He will continue follow up with Dr. Julien Nordmann as well.     Nicholos Johns, PA-C    Tyler Pita, MD  [facility] Direct Dial: [phone]  Fax: [phone] [email or web address]  Skype  [email or web address]

## 2022-10-06 ENCOUNTER — Encounter: Payer: Self-pay | Admitting: Internal Medicine

## 2022-10-06 ENCOUNTER — Ambulatory Visit
Admission: RE | Admit: 2022-10-06 | Discharge: 2022-10-06 | Disposition: A | Discharge status: Home / Self Care | Payer: BLUE CROSS/BLUE SHIELD | Source: Ambulatory Visit | Attending: Urology | Admitting: Urology

## 2022-10-06 DIAGNOSIS — C7931 Secondary malignant neoplasm of brain: Secondary | ICD-10-CM

## 2022-10-06 DIAGNOSIS — C7951 Secondary malignant neoplasm of bone: Secondary | ICD-10-CM

## 2022-10-10 ENCOUNTER — Encounter: Payer: Self-pay | Admitting: Internal Medicine

## 2022-10-10 ENCOUNTER — Inpatient Hospital Stay: Payer: Medicaid Other

## 2022-10-10 ENCOUNTER — Other Ambulatory Visit: Payer: Self-pay | Admitting: Radiation Therapy

## 2022-10-10 ENCOUNTER — Other Ambulatory Visit: Payer: Self-pay

## 2022-10-10 ENCOUNTER — Other Ambulatory Visit: Payer: Self-pay | Admitting: Medical Oncology

## 2022-10-10 DIAGNOSIS — Z5111 Encounter for antineoplastic chemotherapy: Secondary | ICD-10-CM | POA: Diagnosis not present

## 2022-10-10 DIAGNOSIS — C7951 Secondary malignant neoplasm of bone: Secondary | ICD-10-CM

## 2022-10-10 DIAGNOSIS — C7931 Secondary malignant neoplasm of brain: Secondary | ICD-10-CM

## 2022-10-10 DIAGNOSIS — Z95828 Presence of other vascular implants and grafts: Secondary | ICD-10-CM

## 2022-10-10 LAB — CMP (CANCER CENTER ONLY)
ALT: 41 U/L (ref 0–44)
AST: 26 U/L (ref 15–41)
Albumin: 4.1 g/dL (ref 3.5–5.0)
Alkaline Phosphatase: 91 U/L (ref 38–126)
Anion gap: 6 (ref 5–15)
BUN: 8 mg/dL (ref 6–20)
CO2: 27 mmol/L (ref 22–32)
Calcium: 9.4 mg/dL (ref 8.9–10.3)
Chloride: 100 mmol/L (ref 98–111)
Creatinine: 0.48 mg/dL — ABNORMAL LOW (ref 0.61–1.24)
GFR, Estimated: >60 mL/min (ref >60)
Glucose, Bld: 460 mg/dL — ABNORMAL HIGH (ref 70–99)
Potassium: 3.7 mmol/L (ref 3.5–5.1)
Sodium: 133 mmol/L — ABNORMAL LOW (ref 135–145)
Total Bilirubin: 0.4 mg/dL (ref 0.3–1.2)
Total Protein: 7.1 g/dL (ref 6.5–8.1)

## 2022-10-10 LAB — CBC WITH DIFFERENTIAL (CANCER CENTER ONLY)
Abs Immature Granulocytes: 0.01 10*3/uL (ref 0.00–0.07)
Basophils Absolute: 0 10*3/uL (ref 0.0–0.1)
Basophils Relative: 0 %
Eosinophils Absolute: 0 10*3/uL (ref 0.0–0.5)
Eosinophils Relative: 0 %
HCT: 32.4 % — ABNORMAL LOW (ref 39.0–52.0)
Hemoglobin: 11.7 g/dL — ABNORMAL LOW (ref 13.0–17.0)
Immature Granulocytes: 0 %
Lymphocytes Relative: 17 %
Lymphs Abs: 0.8 10*3/uL (ref 0.7–4.0)
MCH: 33 pg (ref 26.0–34.0)
MCHC: 36.1 g/dL — ABNORMAL HIGH (ref 30.0–36.0)
MCV: 91.3 fL (ref 80.0–100.0)
Monocytes Absolute: 0.4 10*3/uL (ref 0.1–1.0)
Monocytes Relative: 8 %
Neutro Abs: 3.3 10*3/uL (ref 1.7–7.7)
Neutrophils Relative %: 75 %
Platelet Count: 151 10*3/uL (ref 150–400)
RBC: 3.55 MIL/uL — ABNORMAL LOW (ref 4.22–5.81)
RDW: 13 % (ref 11.5–15.5)
WBC Count: 4.5 10*3/uL (ref 4.0–10.5)
nRBC: 0 % (ref 0.0–0.2)

## 2022-10-10 MED ORDER — SODIUM CHLORIDE 0.9% FLUSH
10.0000 mL | INTRAVENOUS | Status: AC | PRN
  Administered 2022-10-10: 10 mL

## 2022-10-10 MED ORDER — HEPARIN SOD (PORK) LOCK FLUSH 100 UNIT/ML IV SOLN
500.0000 [IU] | INTRAVENOUS | Status: AC | PRN
  Administered 2022-10-10: 500 [IU]

## 2022-10-12 ENCOUNTER — Telehealth: Payer: Self-pay | Admitting: Internal Medicine

## 2022-10-12 NOTE — Telephone Encounter (Signed)
Called patient regarding upcoming 11/27 appointments, patient is notified.

## 2022-10-12 NOTE — Progress Notes (Signed)
[facility] OFFICE PROGRESS NOTE  Martinique, [name], MD 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   Biomarker Findings Microsatellite status - MS-Stable Tumor Mutational Burden - 8 Muts/Mb Genomic Findings For a complete list of the genes assayed, please refer to the Appendix. KEAP1 Q227* CDKN2A/B [id number] G89V PRKCI amplification TERC amplification - equivocal? TP53 [id number] 8 Disease relevant genes with no reportable alterations: ALK, BRAF, EGFR, ERBB2, KRAS, MET, RET, ROS1   PDL1 TPS  90%  PRIOR THERAPY: 1) Status post palliative radiotherapy to the L2 lesion under the care of Dr. MTammi Klippel 2) Status post SRS to solitary brain metastasis under the care of Dr. MTammi Klippel CURRENT THERAPY:  Palliative systemic therapy with carboplatin for AUC of 5, Alimta 500 Mg/M2 and Keytruda 200 Mg IV every 3 weeks.  First dose September 05, 2022.  Status post 2 cycles.    INTERVAL HISTORY: Samuel [name] 56y.o. male returns to the clinic today for a follow-up visit.  The patient was recently diagnosed with metastatic lung cancer.  He is currently undergoing palliative systemic chemotherapy and is status post 2 cycles.  He tolerates this very well except for some fatigue.  He completed palliative radiation to the metastatic disease in the lumbar spine under the care of Dr. MTammi Klippel  He also is followed closely by palliative care for pain management.   Overall, the patient has been feeling well.  He states that his appetite has improved and he has gained 2 pounds.  He reports that he is in less pain and he  is now using a walker as opposed to a wheelchair.  He is scheduled to see palliative care today.  He denies any fever, chills, night sweats.  He denies any chest pain, shortness of breath, cough, or hemoptysis.  Denies any nausea, vomiting, or constipation.  He reports he had a few episodes of diarrhea following treatment that lasted approximately 2 days that resolved.  Denies any headaches.  He reports some blurry vision at nighttime but he has not had an eye exam in over a year.  Patient does have uncontrolled diabetes.  He reports that one of his diabetic medications was discontinued when he was not eating as much previously.  Now that he is eating more, he is wondering if he needs to go back on this.  He is here today for evaluation repeat blood work before undergoing cycle #3.    MEDICAL HISTORY: Past Medical History:  Diagnosis Date   Diabetes mellitus without complication ([facility]    [facility]   Metastatic cancer to spine ([facility] 07/19/2022   Pancreatitis unk   [facility] lung [facility] 08/05/2022    ALLERGIES:  is allergic to penicillins and percocet [oxycodone-acetaminophen].  MEDICATIONS:  Current Outpatient Medications  Medication Sig Dispense Refill   acetaminophen (TYLENOL) 500 MG tablet Take 1,000 mg by mouth every 6 (six) hours as needed for mild pain or moderate pain.     atorvastatin (LIPITOR) 40 MG tablet Take 1 tablet (40 mg total) by mouth daily. 30 tablet 2   blood glucose meter kit and supplies KIT Dispense based  on patient and insurance preference. Use up to four times daily as directed. 1 each 0   docusate sodium (COLACE) 100 MG capsule Take 1 capsule (100 mg total) by mouth 2 (two) times daily. 30 capsule 0   escitalopram (LEXAPRO) 20 MG tablet Take 1 tablet (20 mg total) by mouth daily. 30 tablet 2   Fingerstix Lancets MISC use as directed 951 each 0   folic acid (FOLVITE) 1 MG tablet Take 1 tablet (1 mg total) by mouth daily. 30  tablet 4   gabapentin (NEURONTIN) 300 MG capsule Take 2 capsules (600 mg total) by mouth 3 (three) times daily. 180 capsule 1   glucose blood test strip use as directed 100 each 0   Insulin Lispro Prot & Lispro (HUMALOG 75/25 MIX) (75-25) 100 UNIT/ML Kwikpen Inject 10 units into the skin 2 times daily with a meal 15 mL 5   Insulin Pen Needle 32G X 4 MM MISC Use with Insulin pen 100 each 3   ipratropium-albuterol (DUONEB) 0.5-2.5 (3) MG/3ML SOLN Take 3 mLs by nebulization every 6 (six) hours as needed (Shortness of breath). 360 mL 0   lidocaine-prilocaine (EMLA) cream Apply to the Port-A-Cath site 30-60 minutes before chemo. 30 g 0   morphine (MS CONTIN) 15 MG 12 hr tablet Take 1 tablet (15 mg total) by mouth every 12 (twelve) hours. 60 tablet 0   oxyCODONE (OXY IR/ROXICODONE) 5 MG immediate release tablet Take 1-2 tablets (5-10 mg total) by mouth every 4 (four) hours as needed for severe pain. 120 tablet 0   polyethylene glycol powder (GLYCOLAX/MIRALAX) 17 GM/SCOOP powder Take 1 capful  (17 g) with water by mouth daily as needed. 238 g 0   prochlorperazine (COMPAZINE) 10 MG tablet Take 1 tablet (10 mg total) by mouth every 6 (six) hours as needed for nausea or vomiting. 30 tablet 0   No current facility-administered medications for this visit.   Facility-Administered Medications Ordered in Other Visits  Medication Dose Route Frequency Provider Last Rate Last Admin   CARBOplatin (PARAPLATIN) 520 mg in sodium chloride 0.9 % 250 mL chemo infusion  520 mg Intravenous Once Curt Bears, MD       cyanocobalamin (VITAMIN B12) injection 1,000 mcg  1,000 mcg Intramuscular Once Curt Bears, MD       dexamethasone (DECADRON) 10 mg in sodium chloride 0.9 % 50 mL IVPB  10 mg Intravenous Once Curt Bears, MD       fosaprepitant (EMEND) 150 mg in sodium chloride 0.9 % 145 mL IVPB  150 mg Intravenous Once Curt Bears, MD       heparin lock flush 100 unit/mL  500 Units Intracatheter Once PRN  Curt Bears, MD       insulin aspart (novoLOG) injection 10 Units  10 Units Subcutaneous Once Tamaya Pun L, PA-C       palonosetron (ALOXI) injection 0.25 mg  0.25 mg Intravenous Once Curt Bears, MD       pembrolizumab [facility]) 200 mg in sodium chloride 0.9 % 50 mL chemo infusion  200 mg Intravenous Once Curt Bears, MD       PEMEtrexed (ALIMTA) 800 mg in sodium chloride 0.9 % 100 mL chemo infusion  500 mg/m2 (Treatment Plan Recorded) Intravenous Once Curt Bears, MD       sodium chloride flush (NS) 0.9 % injection 10 mL  10 mL Intracatheter PRN Curt Bears, MD        SURGICAL HISTORY:  Past Surgical History:  Procedure Laterality Date  IR BONE TUMOR(S)RF ABLATION  08/01/2022   IR IMAGING GUIDED PORT INSERTION  09/21/2022   IR KYPHO LUMBAR INC FX REDUCE BONE BX UNI/BIL CANNULATION INC/IMAGING  08/01/2022    REVIEW OF SYSTEMS:   Review of Systems  Constitutional: Negative for appetite change, chills, fatigue, fever and unexpected weight change.  HENT: Negative for mouth sores, nosebleeds, sore throat and trouble swallowing.   Eyes: Negative for eye problems and icterus.  Respiratory: Negative for cough, hemoptysis, shortness of breath and wheezing.   Cardiovascular: Negative for chest pain and leg swelling.  Gastrointestinal: Negative for abdominal pain, constipation, nausea and vomiting. Diarrhea self limiting a few days after treatment.  Genitourinary: Negative for bladder incontinence, difficulty urinating, dysuria, frequency and hematuria.   Musculoskeletal: Negative for back pain, gait problem, neck pain and neck stiffness.  Skin: Negative for itching and rash.  Neurological: Negative for dizziness, extremity weakness, gait problem, headaches, light-headedness and seizures.  Hematological: Negative for adenopathy. Does not bruise/bleed easily.  Psychiatric/Behavioral: Negative for confusion, depression and sleep disturbance. The patient is not  nervous/anxious.     PHYSICAL EXAMINATION:  Blood pressure 123/76, pulse (!) 101, temperature 97.9 F (36.6 C), temperature source Oral, resp. rate 17, weight 125 lb 11.2 oz (57 kg), SpO2 100 %.  ECOG PERFORMANCE STATUS: 1  Physical Exam  Constitutional: Oriented to person, place, and time and well-developed, well-nourished, and in no distress.  HENT:  Head: Normocephalic and atraumatic.  Mouth/Throat: Oropharynx is clear and moist. No oropharyngeal exudate.  Eyes: Conjunctivae are normal. Right eye exhibits no discharge. Left eye exhibits no discharge. No scleral icterus.  Neck: Normal range of motion. Neck supple.  Cardiovascular: Normal rate, regular rhythm, normal heart sounds and intact distal pulses.   Pulmonary/Chest: Effort normal and breath sounds decreased. No respiratory distress. No wheezes. No rales.  Abdominal: Exhibits no distension  Musculoskeletal: back brace, uses walker. Exhibits no edema.  Lymphadenopathy:    No cervical adenopathy.  Neurological: Alert and oriented to person, place, and time. Exhibits normal muscle tone. Coordination normal.  Skin: Skin is warm and dry. No rash noted. Not diaphoretic. No erythema. No pallor.  Psychiatric: Mood, memory and judgment normal.  Vitals reviewed.  LABORATORY DATA: Lab Results  Component Value Date   WBC 5.9 10/17/2022   HGB 11.8 (L) 10/17/2022   HCT 33.7 (L) 10/17/2022   MCV 94.4 10/17/2022   PLT 197 10/17/2022      Chemistry      Component Value Date/Time   NA 135 10/17/2022 0825   NA 140 12/24/2020 1144   NA 142 08/02/2014 1649   K 3.7 10/17/2022 0825   K 4.2 08/02/2014 1649   CL 103 10/17/2022 0825   CL 109 (H) 08/02/2014 1649   CO2 27 10/17/2022 0825   CO2 21 08/02/2014 1649   BUN 10 10/17/2022 0825   BUN 7 12/24/2020 1144   BUN 11 08/02/2014 1649   CREATININE 0.59 (L) 10/17/2022 0825   CREATININE 0.92 08/02/2014 1649      Component Value Date/Time   CALCIUM 9.5 10/17/2022 0825   CALCIUM 8.6  08/02/2014 1649   ALKPHOS 99 10/17/2022 0825   ALKPHOS 82 08/02/2014 1649   AST 17 10/17/2022 0825   ALT 41 10/17/2022 0825   ALT 132 (H) 08/02/2014 1649   BILITOT 0.3 10/17/2022 0825       RADIOGRAPHIC STUDIES:  IR IMAGING GUIDED PORT INSERTION  Result Date: 09/21/2022 INDICATION: Metastatic lung cancer. In need of durable intravenous access for  chemotherapy administration. EXAM: IMPLANTED PORT A CATH PLACEMENT WITH ULTRASOUND AND FLUOROSCOPIC GUIDANCE COMPARISON:  PET-CT-08/04/2022 MEDICATIONS: None ANESTHESIA/SEDATION: Moderate (conscious) sedation was employed during this procedure as administered by the Interventional Radiology RN. A total of Versed 1.5 mg and Fentanyl 75 mcg was administered intravenously. Moderate Sedation Time: 26 minutes. The patient's level of consciousness and vital signs were monitored continuously by radiology nursing throughout the procedure under my direct supervision. CONTRAST:  None FLUOROSCOPY TIME:  30 seconds (1 mGy) COMPLICATIONS: None immediate. PROCEDURE: The procedure, risks, benefits, and alternatives were explained to the patient. Questions regarding the procedure were encouraged and answered. The patient understands and consents to the procedure. The right neck and chest were prepped with chlorhexidine in a sterile fashion, and a sterile drape was applied covering the operative field. Maximum barrier sterile technique with sterile gowns and gloves were used for the procedure. A timeout was performed prior to the initiation of the procedure. Local anesthesia was provided with 1% lidocaine with epinephrine. After creating a small venotomy incision, a micropuncture kit was utilized to access the internal jugular vein. Real-time ultrasound guidance was utilized for vascular access including the acquisition of a permanent ultrasound image documenting patency of the accessed vessel. The microwire was utilized to measure appropriate catheter length. A subcutaneous  port pocket was then created along the upper chest wall utilizing a combination of sharp and blunt dissection. The pocket was irrigated with sterile saline. A single lumen Slim sized power injectable port was chosen for placement. The 8 Fr catheter was tunneled from the port pocket site to the venotomy incision. The port was placed in the pocket. The external catheter was trimmed to appropriate length. At the venotomy, an 8 Fr peel-away sheath was placed over a guidewire under fluoroscopic guidance. The catheter was then placed through the sheath and the sheath was removed. Final catheter positioning was confirmed and documented with a fluoroscopic spot radiograph. The port was accessed with a Huber needle, aspirated and flushed with heparinized saline. The venotomy site was closed with an interrupted 4-0 Vicryl suture. The port pocket incision was closed with interrupted 2-0 Vicryl suture. Dermabond and Steri-strips were applied to both incisions. Dressings were applied. The patient tolerated the procedure well without immediate post procedural complication. FINDINGS: After catheter placement, the tip lies within the superior cavoatrial junction. The catheter aspirates and flushes normally and is ready for immediate use. IMPRESSION: Successful placement of a right internal jugular approach power injectable Port-A-Cath. The catheter is ready for immediate use. Electronically Signed   By: Sandi Mariscal M.D.   On: 09/21/2022 16:06     ASSESSMENT/PLAN:  This is a very pleasant 56 year old African-American male diagnosed with stage IV (T3, N2, M1 C) non-small cell lung cancer, adenocarcinoma.  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  He underwent biopsy and kyphoplasty to the L2 and to the brain metastasis.  He was diagnosed in September 2023.    His molecular studies showed no actionable mutations and his PD-L1 expression is 90%.  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   He underwent palliative radiation to L2 compression fracture as well as a solitary brain metastasis with SRS.   He is currently being treated with palliative systemic chemotherapy with carboplatin for  an AUC of 5, Alimta 500 mg per metered square, Keytruda 200 mg IV every 3 weeks.  He status post 2 cycles and he tolerated well without any concerning adverse side effects except for one episode of nausea. He also had diarrhea about 2 days following treatment.   Labs were reviewed. His BS is 483 today.  We will administer 10  units of insulin in the clinic today.  We will call his PCP's today for further instructions about his insulin dose/refills since his appetite has improved and he is eating more. He is not able to have an in person office visit but would be open to telephone visit with them this week.   Advised an eye exam considering his vision changes.   He also was instructed to monitor his blood sugar closely at home.  We reviewed signs and symptoms of hyperglycemia which she stated he was already aware of and he knows to check his blood sugar, especially when he develops any symptoms.  Recommend that he proceed with cycle #3 today scheduled.   I will arrange for restaging CT scan of the chest, abdomen, and pelvis prior to starting his Next cycle of treatment.  We will see him back for follow-up visit in 3 weeks for evaluation repeat blood work to review his scan results before starting cycle #4.  He will continue follow-up with palliative care for his pain management.  The patient was advised to call immediately if he has any concerning symptoms in the interval. The patient voices understanding of current disease status and treatment options and  is in agreement with the current care plan. All questions were answered. The patient knows to call the clinic with any problems, questions or concerns. We can certainly see the patient much sooner if necessary       Orders Placed This Encounter  Procedures   CT Chest W Contrast    Standing Status:   Future    Standing Expiration Date:   10/17/2023    Order Specific Question:   If indicated for the ordered procedure, I authorize the administration of contrast media per Radiology protocol    Answer:   Yes    Order Specific Question:   Does the patient have a contrast media/X-ray dye allergy?    Answer:   No    Order Specific Question:   Preferred imaging location?    Answer:   [facility]     The total time spent in the appointment was 20-29 minutes.   Saia Derossett L Yitzchak Kothari, PA-C 10/17/22

## 2022-10-14 MED FILL — Dexamethasone Sodium Phosphate Inj 100 MG/10ML: INTRAMUSCULAR | Qty: 1 | Status: AC

## 2022-10-14 MED FILL — Fosaprepitant Dimeglumine For IV Infusion 150 MG (Base Eq): INTRAVENOUS | Qty: 5 | Status: AC

## 2022-10-17 ENCOUNTER — Other Ambulatory Visit: Payer: Self-pay

## 2022-10-17 ENCOUNTER — Inpatient Hospital Stay: Payer: Medicaid Other

## 2022-10-17 ENCOUNTER — Telehealth: Payer: Self-pay

## 2022-10-17 ENCOUNTER — Inpatient Hospital Stay (HOSPITAL_BASED_OUTPATIENT_CLINIC_OR_DEPARTMENT_OTHER): Payer: Medicaid Other | Admitting: Physician Assistant

## 2022-10-17 ENCOUNTER — Inpatient Hospital Stay (HOSPITAL_BASED_OUTPATIENT_CLINIC_OR_DEPARTMENT_OTHER): Payer: Medicaid Other | Admitting: Nurse Practitioner

## 2022-10-17 ENCOUNTER — Encounter: Payer: Self-pay | Admitting: Nurse Practitioner

## 2022-10-17 VITALS — HR 93

## 2022-10-17 VITALS — BP 123/76 | HR 101 | Temp 97.9°F | Resp 17 | Wt 125.7 lb

## 2022-10-17 DIAGNOSIS — Z515 Encounter for palliative care: Secondary | ICD-10-CM

## 2022-10-17 DIAGNOSIS — E1142 Type 2 diabetes mellitus with diabetic polyneuropathy: Secondary | ICD-10-CM | POA: Diagnosis not present

## 2022-10-17 DIAGNOSIS — Z5111 Encounter for antineoplastic chemotherapy: Secondary | ICD-10-CM | POA: Diagnosis not present

## 2022-10-17 DIAGNOSIS — K5900 Constipation, unspecified: Secondary | ICD-10-CM | POA: Diagnosis not present

## 2022-10-17 DIAGNOSIS — G893 Neoplasm related pain (acute) (chronic): Secondary | ICD-10-CM

## 2022-10-17 DIAGNOSIS — C3490 Malignant neoplasm of unspecified part of unspecified bronchus or lung: Secondary | ICD-10-CM

## 2022-10-17 DIAGNOSIS — Z95828 Presence of other vascular implants and grafts: Secondary | ICD-10-CM

## 2022-10-17 DIAGNOSIS — R530 Neoplastic (malignant) related fatigue: Secondary | ICD-10-CM

## 2022-10-17 DIAGNOSIS — C7951 Secondary malignant neoplasm of bone: Secondary | ICD-10-CM

## 2022-10-17 LAB — CBC WITH DIFFERENTIAL (CANCER CENTER ONLY)
Abs Immature Granulocytes: 0.02 10*3/uL (ref 0.00–0.07)
Basophils Absolute: 0 10*3/uL (ref 0.0–0.1)
Basophils Relative: 0 %
Eosinophils Absolute: 0 10*3/uL (ref 0.0–0.5)
Eosinophils Relative: 0 %
HCT: 33.7 % — ABNORMAL LOW (ref 39.0–52.0)
Hemoglobin: 11.8 g/dL — ABNORMAL LOW (ref 13.0–17.0)
Immature Granulocytes: 0 %
Lymphocytes Relative: 12 %
Lymphs Abs: 0.7 10*3/uL (ref 0.7–4.0)
MCH: 33.1 pg (ref 26.0–34.0)
MCHC: 35 g/dL (ref 30.0–36.0)
MCV: 94.4 fL (ref 80.0–100.0)
Monocytes Absolute: 0.6 10*3/uL (ref 0.1–1.0)
Monocytes Relative: 11 %
Neutro Abs: 4.5 10*3/uL (ref 1.7–7.7)
Neutrophils Relative %: 77 %
Platelet Count: 197 10*3/uL (ref 150–400)
RBC: 3.57 MIL/uL — ABNORMAL LOW (ref 4.22–5.81)
RDW: 14 % (ref 11.5–15.5)
WBC Count: 5.9 10*3/uL (ref 4.0–10.5)
nRBC: 0 % (ref 0.0–0.2)

## 2022-10-17 LAB — CMP (CANCER CENTER ONLY)
ALT: 41 U/L (ref 0–44)
AST: 17 U/L (ref 15–41)
Albumin: 4.2 g/dL (ref 3.5–5.0)
Alkaline Phosphatase: 99 U/L (ref 38–126)
Anion gap: 5 (ref 5–15)
BUN: 10 mg/dL (ref 6–20)
CO2: 27 mmol/L (ref 22–32)
Calcium: 9.5 mg/dL (ref 8.9–10.3)
Chloride: 103 mmol/L (ref 98–111)
Creatinine: 0.59 mg/dL — ABNORMAL LOW (ref 0.61–1.24)
GFR, Estimated: >60 mL/min (ref >60)
Glucose, Bld: 483 mg/dL — ABNORMAL HIGH (ref 70–99)
Potassium: 3.7 mmol/L (ref 3.5–5.1)
Sodium: 135 mmol/L (ref 135–145)
Total Bilirubin: 0.3 mg/dL (ref 0.3–1.2)
Total Protein: 7 g/dL (ref 6.5–8.1)

## 2022-10-17 LAB — TSH: TSH: 1.502 u[IU]/mL (ref 0.350–4.500)

## 2022-10-17 MED ORDER — SODIUM CHLORIDE 0.9 % IV SOLN
10.0000 mg | Freq: Once | INTRAVENOUS | Status: AC
  Administered 2022-10-17: 10 mg via INTRAVENOUS
  Filled 2022-10-17: qty 10

## 2022-10-17 MED ORDER — HEPARIN SOD (PORK) LOCK FLUSH 100 UNIT/ML IV SOLN
500.0000 [IU] | Freq: Once | INTRAVENOUS | Status: AC | PRN
  Administered 2022-10-17: 500 [IU]

## 2022-10-17 MED ORDER — SODIUM CHLORIDE 0.9 % IV SOLN
517.0000 mg | Freq: Once | INTRAVENOUS | Status: AC
  Administered 2022-10-17: 520 mg via INTRAVENOUS
  Filled 2022-10-17: qty 52

## 2022-10-17 MED ORDER — SODIUM CHLORIDE 0.9 % IV SOLN
500.0000 mg/m2 | Freq: Once | INTRAVENOUS | Status: AC
  Administered 2022-10-17: 800 mg via INTRAVENOUS
  Filled 2022-10-17: qty 20

## 2022-10-17 MED ORDER — PALONOSETRON HCL INJECTION 0.25 MG/5ML
0.2500 mg | Freq: Once | INTRAVENOUS | Status: AC
  Administered 2022-10-17: 0.25 mg via INTRAVENOUS
  Filled 2022-10-17: qty 5

## 2022-10-17 MED ORDER — SODIUM CHLORIDE 0.9 % IV SOLN: Freq: Once | INTRAVENOUS | Status: AC

## 2022-10-17 MED ORDER — SODIUM CHLORIDE 0.9 % IV SOLN
200.0000 mg | Freq: Once | INTRAVENOUS | Status: AC
  Administered 2022-10-17: 200 mg via INTRAVENOUS
  Filled 2022-10-17: qty 200

## 2022-10-17 MED ORDER — CYANOCOBALAMIN 1000 MCG/ML IJ SOLN
1000.0000 ug | Freq: Once | INTRAMUSCULAR | Status: AC
  Administered 2022-10-17: 1000 ug via INTRAMUSCULAR
  Filled 2022-10-17: qty 1

## 2022-10-17 MED ORDER — SODIUM CHLORIDE 0.9 % IV SOLN
150.0000 mg | Freq: Once | INTRAVENOUS | Status: AC
  Administered 2022-10-17: 150 mg via INTRAVENOUS
  Filled 2022-10-17: qty 150

## 2022-10-17 MED ORDER — INSULIN ASPART 100 UNIT/ML IJ SOLN
10.0000 [IU] | Freq: Once | INTRAMUSCULAR | Status: AC
  Administered 2022-10-17: 10 [IU] via SUBCUTANEOUS
  Filled 2022-10-17: qty 1

## 2022-10-17 MED ORDER — SODIUM CHLORIDE 0.9% FLUSH
10.0000 mL | INTRAVENOUS | Status: DC | PRN
  Administered 2022-10-17: 10 mL via INTRAVENOUS

## 2022-10-17 MED ORDER — SODIUM CHLORIDE 0.9% FLUSH
10.0000 mL | INTRAVENOUS | Status: DC | PRN
  Administered 2022-10-17: 10 mL

## 2022-10-17 NOTE — Progress Notes (Signed)
After 10U insulin, CBG 391.Marland KitchenMarland KitchenCassie, PA aware.

## 2022-10-17 NOTE — Telephone Encounter (Signed)
This nurse reached out to patients primary care physician and left a message related to patients elevated blood sugars for the past couple weeks.  Advised that he has consistently been in the 400 range and today he is in the 500's.  Patient has been asked to reach out to his primary care and he has not done so.  This nurse request for a call back from their office about management of the elevated blood sugars.  No further concerns at this time.

## 2022-10-17 NOTE — Progress Notes (Signed)
[place]  [phone] [phone]   Name: Samuel [name] Date: 10/17/2022 MRN: [id number]  DOB: [birth year]  Patient Care Team: Martinique, [name], MD as PCP - General (Family Medicine) Camillia Herter, NP as Nurse Practitioner (Nurse Practitioner)    INTERVAL HISTORY: Samuel [name] [name] is a 56 y.o. 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  Palliative ask to see for symptom management and goals of care.   SOCIAL HISTORY:     reports that he quit smoking about 2 months ago. His smoking use included cigarettes. He smoked an average of 1 pack per day. He has never used smokeless tobacco. He reports that he does not currently use alcohol after a past usage of about 50.0 standard drinks of alcohol per week. He reports that he does not use drugs.  ADVANCE DIRECTIVES:  Patient does not have an advanced directives. Education and packet provided. He has expressed interest in completing. States his sister, [name] would be his designated Scientist, research (medical).   CODE STATUS: Full code  PAST MEDICAL HISTORY: Past Medical History:  Diagnosis Date   Diabetes mellitus without complication ([place])    Metastatic cancer to brain ([place]) 08/05/2022   Metastatic cancer to spine ([place]) 07/19/2022   Pancreatitis unk   Primary lung adenocarcinoma ([place]) 08/05/2022    ALLERGIES:  is allergic to penicillins and percocet [oxycodone-acetaminophen].  MEDICATIONS:  Current Outpatient Medications  Medication Sig Dispense Refill   acetaminophen (TYLENOL) 500 MG tablet Take 1,000 mg by mouth every 6 (six) hours as needed for mild pain or moderate pain.     atorvastatin (LIPITOR) 40 MG tablet Take 1 tablet (40 mg total) by mouth daily. 30 tablet 2   blood glucose meter kit and supplies KIT Dispense based on patient and  insurance preference. Use up to four times daily as directed. 1 each 0   docusate sodium (COLACE) 100 MG capsule Take 1 capsule (100 mg total) by mouth 2 (two) times daily. 30 capsule 0   escitalopram (LEXAPRO) 20 MG tablet Take 1 tablet (20 mg total) by mouth daily. 30 tablet 2   Fingerstix Lancets MISC use as directed 921 each 0   folic acid (FOLVITE) 1 MG tablet Take 1 tablet (1 mg total) by mouth daily. 30 tablet 4   gabapentin (NEURONTIN) 300 MG capsule Take 2 capsules (600 mg total) by mouth 3 (three) times daily. 180 capsule 1   glucose blood test strip use as directed 100 each 0   Insulin Lispro Prot & Lispro (HUMALOG 75/25 MIX) (75-25) 100 UNIT/ML Kwikpen Inject 10 units into the skin 2 times daily with a meal 15 mL 5   Insulin Pen Needle 32G X 4 MM MISC Use with Insulin pen 100 each 3   ipratropium-albuterol (DUONEB) 0.5-2.5 (3) MG/3ML SOLN Take 3 mLs by nebulization every 6 (six) hours as needed (Shortness of breath). 360 mL 0   lidocaine-prilocaine (EMLA) cream Apply to the Port-A-Cath site 30-60 minutes before chemo. 30 g 0   morphine (MS CONTIN) 15 MG 12 hr tablet Take 1 tablet (15 mg total) by mouth every 12 (twelve) hours. 60 tablet 0   oxyCODONE (OXY IR/ROXICODONE) 5 MG immediate release tablet Take 1-2 tablets (5-10 mg total) by mouth every 4 (four) hours as needed for severe pain. 120 tablet 0   polyethylene  glycol powder (GLYCOLAX/MIRALAX) 17 GM/SCOOP powder Take 1 capful  (17 g) with water by mouth daily as needed. 238 g 0   prochlorperazine (COMPAZINE) 10 MG tablet Take 1 tablet (10 mg total) by mouth every 6 (six) hours as needed for nausea or vomiting. 30 tablet 0   No current facility-administered medications for this visit.   Facility-Administered Medications Ordered in Other Visits  Medication Dose Route Frequency Provider Last Rate Last Admin   CARBOplatin (PARAPLATIN) 520 mg in sodium chloride 0.9 % 250 mL chemo infusion  520 mg Intravenous Once Curt Bears, MD        cyanocobalamin (VITAMIN B12) injection 1,000 mcg  1,000 mcg Intramuscular Once Curt Bears, MD       dexamethasone (DECADRON) 10 mg in sodium chloride 0.9 % 50 mL IVPB  10 mg Intravenous Once Curt Bears, MD 204 mL/hr at 10/17/22 0934 10 mg at 10/17/22 0934   fosaprepitant (EMEND) 150 mg in sodium chloride 0.9 % 145 mL IVPB  150 mg Intravenous Once Curt Bears, MD 450 mL/hr at 10/17/22 0935 150 mg at 10/17/22 0935   heparin lock flush 100 unit/mL  500 Units Intracatheter Once PRN Curt Bears, MD       insulin aspart (novoLOG) injection 10 Units  10 Units Subcutaneous Once Heilingoetter, Cassandra L, PA-C       pembrolizumab (KEYTRUDA) 200 mg in sodium chloride 0.9 % 50 mL chemo infusion  200 mg Intravenous Once Curt Bears, MD       PEMEtrexed (ALIMTA) 800 mg in sodium chloride 0.9 % 100 mL chemo infusion  500 mg/m2 (Treatment Plan Recorded) Intravenous Once Curt Bears, MD       sodium chloride flush (NS) 0.9 % injection 10 mL  10 mL Intracatheter PRN Curt Bears, MD        VITAL SIGNS: There were no vitals taken for this visit. There were no vitals filed for this visit.  Estimated body mass index is 19.11 kg/m as calculated from the following:   Height as of 09/21/22: _0  (1.727 m).   Weight as of an earlier encounter on 10/17/22: 125 lb 11.2 oz (57 kg).   PERFORMANCE STATUS (ECOG) : 1 - Symptomatic but completely ambulatory   Physical Exam General: NAD Cardiovascular: regular rate and rhythm Pulmonary:normal breathing pattern  Extremities: no edema, no joint deformities Skin: no rashes Neurological: AAO x3  IMPRESSION: I saw [name] during his infusion today. No acute distress noted. He is tolerating treatments. Denies concerns with constipation, diarrhea, nausea, or vomiting. Appetite continues to improve. He associates this more to his improvement in pain. States previously he was so focused on pain and not feeling well that he did not  have an appetite which is no longer the case. His weight has increased 3lbs. Able to ambulate with walker which he is appreciative of not having to be in a wheelchair. Express hopes that he can utilize a cane or nothing after the first of the year if he continues to improve and feel well.   Neoplasm related pain Octave reports pain is well controlled. Tolerating MS Contin and oxycodone for breakthrough pain. Is able to sleep and increase activity around the home.   Patient pain contract was signed at previous visit on 10/23.  We will continue to closely monitor.   PLAN: MS Contin 15 mg every 12 hours Oxy IR 5-10 mg every 4-6 hours as needed for breakthrough pain Colace twice daily Narcan for emergency use Pain contract signed  on 10/23. I will plan to see patient back in 3-4 weeks in collaboration with his other oncology appointments.   Patient expressed understanding and was in agreement with this plan. He also understands that He can call the clinic at any time with any questions, concerns, or complaints.   Any controlled substances utilized were prescribed in the context of palliative care. PDMP has been reviewed.   Time Total: 35 min   Visit consisted of counseling and education dealing with the complex and emotionally intense issues of symptom management and palliative care in the setting of serious and potentially life-threatening illness.Greater than 50%  of this time was spent counseling and coordinating care related to the above assessment and plan.  Alda Lea, AGPCNP-BC  Palliative Medicine Team/[facility]

## 2022-10-17 NOTE — Patient Instructions (Signed)
[facility]  Discharge Instructions: Thank you for choosing [facility] to provide your oncology and hematology care.   If you have a lab appointment with the [facility], please go directly to the [facility] and check in at the registration area.   Wear comfortable clothing and clothing appropriate for easy access to any Portacath or PICC line.   We strive to give you quality time with your provider. You may need to reschedule your appointment if you arrive late (15 or more minutes).  Arriving late affects you and other patients whose appointments are after yours.  Also, if you miss three or more appointments without notifying the office, you may be dismissed from the clinic at the provider's discretion.      For prescription refill requests, have your pharmacy contact our office and allow 72 hours for refills to be completed.    Today you received the following chemotherapy and/or immunotherapy agents: Keytruda, Alimta, Carboplatin      To help prevent nausea and vomiting after your treatment, we encourage you to take your nausea medication as directed.  BELOW ARE SYMPTOMS THAT SHOULD BE REPORTED IMMEDIATELY: *FEVER GREATER THAN 100.4 F (38 C) OR HIGHER *CHILLS OR SWEATING *NAUSEA AND VOMITING THAT IS NOT CONTROLLED WITH YOUR NAUSEA MEDICATION *UNUSUAL SHORTNESS OF BREATH *UNUSUAL BRUISING OR BLEEDING *URINARY PROBLEMS (pain or burning when urinating, or frequent urination) *BOWEL PROBLEMS (unusual diarrhea, constipation, pain near the anus) TENDERNESS IN MOUTH AND THROAT WITH OR WITHOUT PRESENCE OF ULCERS (sore throat, sores in mouth, or a toothache) UNUSUAL RASH, SWELLING OR PAIN  UNUSUAL VAGINAL DISCHARGE OR ITCHING   Items with * indicate a potential emergency and should be followed up as soon as possible or go to the Emergency Department if any problems should occur.  Please show the CHEMOTHERAPY ALERT CARD or IMMUNOTHERAPY ALERT  CARD at check-in to the Emergency Department and triage nurse.  Should you have questions after your visit or need to cancel or reschedule your appointment, please contact [facility]  Dept: [phone]  and follow the prompts.  Office hours are 8:00 a.m. to 4:30 p.m. Monday - Friday. Please note that voicemails left after 4:00 p.m. may not be returned until the following business day.  We are closed weekends and major holidays. You have access to a nurse at all times for urgent questions. Please call the main number to the clinic Dept: [phone] and follow the prompts.   For any non-urgent questions, you may also contact your provider using MyChart. We now offer e-Visits for anyone 76 and older to request care online for non-urgent symptoms. For details visit [email or web address].   Also download the MyChart app! Go to the app store, search "MyChart", open the app, select [facility], and log in with your MyChart username and password.  Masks are optional in the cancer centers. If you would like for your care team to wear a mask while they are taking care of you, please let them know. You may have one support person who is at least 58 years old accompany you for your appointments.

## 2022-10-17 NOTE — Patient Instructions (Signed)

## 2022-10-18 ENCOUNTER — Ambulatory Visit: Payer: Medicaid Other

## 2022-10-18 ENCOUNTER — Ambulatory Visit: Payer: Medicaid Other | Admitting: Internal Medicine

## 2022-10-18 ENCOUNTER — Other Ambulatory Visit: Payer: Medicaid Other

## 2022-10-18 LAB — T4: T4, Total: 10.5 ug/dL (ref 4.5–12.0)

## 2022-10-19 NOTE — Progress Notes (Signed)
This nurse received a return call from Dr. Martinique, this patients primary care provider,  who stated that they will reach out to patient and schedule an appointment for diabetes management.  The request a copy of patients medication list and the most recent labs which was faxed to [facility] at [phone].  No further questions or concerns noted.

## 2022-10-21 ENCOUNTER — Other Ambulatory Visit: Payer: Self-pay | Admitting: Medical Oncology

## 2022-10-21 DIAGNOSIS — C3490 Malignant neoplasm of unspecified part of unspecified bronchus or lung: Secondary | ICD-10-CM

## 2022-10-21 DIAGNOSIS — Z5112 Encounter for antineoplastic immunotherapy: Secondary | ICD-10-CM

## 2022-10-24 ENCOUNTER — Encounter: Payer: Self-pay | Admitting: Internal Medicine

## 2022-10-24 ENCOUNTER — Other Ambulatory Visit: Payer: Medicaid Other

## 2022-10-24 ENCOUNTER — Inpatient Hospital Stay: Payer: Medicaid Other | Attending: Adult Health

## 2022-10-24 DIAGNOSIS — Z87891 Personal history of nicotine dependence: Secondary | ICD-10-CM | POA: Insufficient documentation

## 2022-10-24 DIAGNOSIS — G893 Neoplasm related pain (acute) (chronic): Secondary | ICD-10-CM | POA: Insufficient documentation

## 2022-10-24 DIAGNOSIS — E119 Type 2 diabetes mellitus without complications: Secondary | ICD-10-CM | POA: Insufficient documentation

## 2022-10-24 DIAGNOSIS — Z79899 Other long term (current) drug therapy: Secondary | ICD-10-CM | POA: Diagnosis not present

## 2022-10-24 DIAGNOSIS — C7931 Secondary malignant neoplasm of brain: Secondary | ICD-10-CM | POA: Diagnosis present

## 2022-10-24 DIAGNOSIS — J439 Emphysema, unspecified: Secondary | ICD-10-CM | POA: Diagnosis not present

## 2022-10-24 DIAGNOSIS — C7951 Secondary malignant neoplasm of bone: Secondary | ICD-10-CM | POA: Diagnosis not present

## 2022-10-24 DIAGNOSIS — Z5111 Encounter for antineoplastic chemotherapy: Secondary | ICD-10-CM | POA: Insufficient documentation

## 2022-10-24 DIAGNOSIS — C3490 Malignant neoplasm of unspecified part of unspecified bronchus or lung: Secondary | ICD-10-CM | POA: Diagnosis not present

## 2022-10-24 DIAGNOSIS — Z794 Long term (current) use of insulin: Secondary | ICD-10-CM | POA: Insufficient documentation

## 2022-10-24 DIAGNOSIS — I700 Atherosclerosis of aorta: Secondary | ICD-10-CM | POA: Insufficient documentation

## 2022-10-24 DIAGNOSIS — Z5112 Encounter for antineoplastic immunotherapy: Secondary | ICD-10-CM | POA: Insufficient documentation

## 2022-10-24 LAB — CBC WITH DIFFERENTIAL (CANCER CENTER ONLY)
Abs Immature Granulocytes: 0 10*3/uL (ref 0.00–0.07)
Basophils Absolute: 0 10*3/uL (ref 0.0–0.1)
Basophils Relative: 0 %
Eosinophils Absolute: 0 10*3/uL (ref 0.0–0.5)
Eosinophils Relative: 0 %
HCT: 32.3 % — ABNORMAL LOW (ref 39.0–52.0)
Hemoglobin: 11.2 g/dL — ABNORMAL LOW (ref 13.0–17.0)
Immature Granulocytes: 0 %
Lymphocytes Relative: 25 %
Lymphs Abs: 0.9 10*3/uL (ref 0.7–4.0)
MCH: 32.5 pg (ref 26.0–34.0)
MCHC: 34.7 g/dL (ref 30.0–36.0)
MCV: 93.6 fL (ref 80.0–100.0)
Monocytes Absolute: 0.5 10*3/uL (ref 0.1–1.0)
Monocytes Relative: 13 %
Neutro Abs: 2.1 10*3/uL (ref 1.7–7.7)
Neutrophils Relative %: 62 %
Platelet Count: 107 10*3/uL — ABNORMAL LOW (ref 150–400)
RBC: 3.45 MIL/uL — ABNORMAL LOW (ref 4.22–5.81)
RDW: 13.6 % (ref 11.5–15.5)
WBC Count: 3.4 10*3/uL — ABNORMAL LOW (ref 4.0–10.5)
nRBC: 0 % (ref 0.0–0.2)

## 2022-10-24 LAB — CMP (CANCER CENTER ONLY)
ALT: 69 U/L — ABNORMAL HIGH (ref 0–44)
AST: 33 U/L (ref 15–41)
Albumin: 4.3 g/dL (ref 3.5–5.0)
Alkaline Phosphatase: 71 U/L (ref 38–126)
Anion gap: 8 (ref 5–15)
BUN: 12 mg/dL (ref 6–20)
CO2: 28 mmol/L (ref 22–32)
Calcium: 9.5 mg/dL (ref 8.9–10.3)
Chloride: 102 mmol/L (ref 98–111)
Creatinine: 0.39 mg/dL — ABNORMAL LOW (ref 0.61–1.24)
GFR, Estimated: >60 mL/min (ref >60)
Glucose, Bld: 150 mg/dL — ABNORMAL HIGH (ref 70–99)
Potassium: 3.2 mmol/L — ABNORMAL LOW (ref 3.5–5.1)
Sodium: 138 mmol/L (ref 135–145)
Total Bilirubin: 0.3 mg/dL (ref 0.3–1.2)
Total Protein: 7.1 g/dL (ref 6.5–8.1)

## 2022-10-24 MED ORDER — SODIUM CHLORIDE 0.9% FLUSH
10.0000 mL | INTRAVENOUS | Status: DC | PRN
  Administered 2022-10-24: 10 mL via INTRAVENOUS

## 2022-10-24 MED ORDER — HEPARIN SOD (PORK) LOCK FLUSH 100 UNIT/ML IV SOLN
500.0000 [IU] | Freq: Once | INTRAVENOUS | Status: AC
  Administered 2022-10-24: 500 [IU] via INTRAVENOUS

## 2022-10-28 ENCOUNTER — Other Ambulatory Visit: Payer: Self-pay | Admitting: Medical Oncology

## 2022-10-28 DIAGNOSIS — C3490 Malignant neoplasm of unspecified part of unspecified bronchus or lung: Secondary | ICD-10-CM

## 2022-10-31 ENCOUNTER — Inpatient Hospital Stay: Payer: Medicaid Other | Admitting: Nurse Practitioner

## 2022-10-31 ENCOUNTER — Other Ambulatory Visit: Payer: Medicaid Other

## 2022-10-31 ENCOUNTER — Encounter: Payer: Self-pay | Admitting: Internal Medicine

## 2022-10-31 ENCOUNTER — Inpatient Hospital Stay: Payer: Medicaid Other

## 2022-11-03 ENCOUNTER — Ambulatory Visit (HOSPITAL_COMMUNITY)
Admission: RE | Admit: 2022-11-03 | Discharge: 2022-11-03 | Disposition: A | Discharge status: Home / Self Care | Payer: Medicaid Other | Source: Ambulatory Visit | Attending: Physician Assistant | Admitting: Physician Assistant

## 2022-11-03 DIAGNOSIS — C3490 Malignant neoplasm of unspecified part of unspecified bronchus or lung: Secondary | ICD-10-CM | POA: Diagnosis present

## 2022-11-03 MED ORDER — HEPARIN SOD (PORK) LOCK FLUSH 100 UNIT/ML IV SOLN
INTRAVENOUS | Status: AC
  Filled 2022-11-03: qty 5

## 2022-11-03 MED ORDER — HEPARIN SOD (PORK) LOCK FLUSH 100 UNIT/ML IV SOLN
500.0000 [IU] | Freq: Once | INTRAVENOUS | Status: AC
  Administered 2022-11-03: 500 [IU] via INTRAVENOUS

## 2022-11-03 MED ORDER — IOHEXOL 300 MG/ML  SOLN
75.0000 mL | Freq: Once | INTRAMUSCULAR | Status: AC | PRN
  Administered 2022-11-03: 75 mL via INTRAVENOUS

## 2022-11-03 MED ORDER — SODIUM CHLORIDE (PF) 0.9 % IJ SOLN
INTRAMUSCULAR | Status: AC
  Filled 2022-11-03: qty 50

## 2022-11-03 NOTE — Progress Notes (Addendum)
[facility] OFFICE PROGRESS NOTE  Martinique, [name], MD [phone] 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    Biomarker Findings Microsatellite status - MS-Stable Tumor Mutational Burden - 8 Muts/Mb Genomic Findings For a complete list of the genes assayed, please refer to the Appendix. KEAP1 Q227* CDKN2A/B [id number] G89V PRKCI amplification TERC amplification - equivocal? TP53 [id number] 8 Disease relevant genes with no reportable alterations: ALK, BRAF, EGFR, ERBB2, KRAS, MET, RET, ROS1   PDL1 TPS  90%  PRIOR THERAPY: 1) Status post palliative radiotherapy to the L2 lesion under the care of Dr. MTammi Klippel 2) Status post SRS to solitary brain metastasis under the care of Dr. MTammi Klippel CURRENT THERAPY:  Palliative systemic therapy with carboplatin for AUC of 5, Alimta 500 Mg/M2 and Keytruda 200 Mg IV every 3 weeks.  First dose September 05, 2022.  Status post 3 cycles.    INTERVAL HISTORY: RLAMICHAEL YOUKHANAII 56y.o. male returns to the clinic today for a follow-up visit accompanied by his sister.  The patient was diagnosed with lung cancer in September 2023.  He is currently undergoing treatment with chemotherapy and immunotherapy.  He has been feeling a lot better since he was initially diagnosed.  The patient initially presented with significant pain for which she completed palliative radiation to the metastatic lesion in the lumbar spine and he is currently being followed by palliative care for pain management. He saw palliative care today.  Patient states that he has been taking oxycodone every 6 hours but  often may not need to take his MS Contin unless he was very active during the day in which case he will only take it at nighttime. The patient is in a lot less pain at this time and he is pleased that he is no longer using a wheelchair and he is using a walker for ambulation.  He is scheduled for repeat MRI of the spine and a dedicated follow-up with radiation oncology in January 2024.  He also has been eating better as well and gaining weight.  Because he has been eating better, he has been having increased blood sugars recently.  He has been having a hard time getting in touch with his PCP to refill his insulin so he has not been taking the normal dose because he has been trying to have his current prescription lasting longer until he can get a refill.  He denies any fever, chills, or night sweats today.  He denies any chest pain, shortness of breath, cough, or hemoptysis.  Denies any nausea, vomiting, or constipation.  Denies any recent diarrhea.  Denies any headache or visual changes.  Visual changes secondary to not having an eye exam in over a year.  The patient recently had a restaging CT scan performed.  He is here today for evaluation and repeat blood work before undergoing cycle #4 and to review his scan results.     MEDICAL HISTORY: Past Medical History:  Diagnosis Date   Diabetes mellitus without complication ([facility]    Metastatic cancer to brain ([facility] 08/05/2022   [place] 07/19/2022   Pancreatitis unk   Primary lung  adenocarcinoma ([place]) 08/05/2022    ALLERGIES:  is allergic to penicillins and percocet [oxycodone-acetaminophen].  MEDICATIONS:  Current Outpatient Medications  Medication Sig Dispense Refill   acetaminophen (TYLENOL) 500 MG tablet Take 1,000 mg by mouth every 6 (six) hours as needed for mild pain or moderate pain.     atorvastatin (LIPITOR) 40 MG tablet Take 1 tablet (40 mg total) by mouth daily. 30 tablet 2   blood glucose meter kit and supplies  KIT Dispense based on patient and insurance preference. Use up to four times daily as directed. 1 each 0   docusate sodium (COLACE) 100 MG capsule Take 1 capsule (100 mg total) by mouth 2 (two) times daily. 30 capsule 0   Fingerstix Lancets MISC use as directed 616 each 0   folic acid (FOLVITE) 1 MG tablet Take 1 tablet (1 mg total) by mouth daily. 30 tablet 4   gabapentin (NEURONTIN) 300 MG capsule Take 2 capsules (600 mg total) by mouth 3 (three) times daily. 180 capsule 1   glucose blood test strip use as directed 100 each 0   Insulin Lispro Prot & Lispro (HUMALOG 75/25 MIX) (75-25) 100 UNIT/ML Kwikpen Inject 10 units into the skin 2 times daily with a meal 15 mL 5   Insulin Pen Needle 32G X 4 MM MISC Use with Insulin pen 100 each 3   ipratropium-albuterol (DUONEB) 0.5-2.5 (3) MG/3ML SOLN Take 3 mLs by nebulization every 6 (six) hours as needed (Shortness of breath). 360 mL 0   lidocaine-prilocaine (EMLA) cream Apply to the Port-A-Cath site 30-60 minutes before chemo. 30 g 0   morphine (MS CONTIN) 15 MG 12 hr tablet Take 1 tablet (15 mg total) by mouth every 12 (twelve) hours. 60 tablet 0   oxyCODONE (OXY IR/ROXICODONE) 5 MG immediate release tablet Take 1-2 tablets (5-10 mg total) by mouth every 4 (four) hours as needed for severe pain. 120 tablet 0   polyethylene glycol powder (GLYCOLAX/MIRALAX) 17 GM/SCOOP powder Take 1 capful  (17 g) with water by mouth daily as needed. 238 g 0   prochlorperazine (COMPAZINE) 10 MG tablet Take 1 tablet (10 mg total) by mouth every 6 (six) hours as needed for nausea or vomiting. 30 tablet 0   No current facility-administered medications for this visit.    SURGICAL HISTORY:  Past Surgical History:  Procedure Laterality Date   IR BONE TUMOR(S)RF ABLATION  08/01/2022   IR IMAGING GUIDED PORT INSERTION  09/21/2022   IR KYPHO LUMBAR INC FX REDUCE BONE BX UNI/BIL CANNULATION INC/IMAGING  08/01/2022    REVIEW OF SYSTEMS:   Review of Systems  Constitutional:  Negative for appetite change, chills, fatigue, fever and unexpected weight change.  HENT: Negative for mouth sores, nosebleeds, sore throat and trouble swallowing.   Eyes: Negative for eye problems and icterus.  Respiratory: Negative for cough, hemoptysis, shortness of breath and wheezing.   Cardiovascular: Negative for chest pain and leg swelling.  Gastrointestinal: Negative for abdominal pain, constipation, diarrhea, nausea and vomiting.  Genitourinary: Negative for bladder incontinence, difficulty urinating, dysuria, frequency and hematuria.   Musculoskeletal: For back pain.  Negative for gait problem, neck pain and neck stiffness.  Skin: Negative for itching and rash.  Neurological: Negative for dizziness, extremity weakness, gait problem, headaches, light-headedness and seizures.  Hematological: Negative for adenopathy. Does not bruise/bleed easily.  Psychiatric/Behavioral: Negative for confusion, depression and sleep disturbance. The patient is not nervous/anxious.     PHYSICAL EXAMINATION:  Blood pressure 132/82, pulse 90, temperature 97.7  F (36.5 C), temperature source Temporal, resp. rate 15, height _0  (1.727 m), weight 128 lb 12.8 oz (58.4 kg), SpO2 100 %.  ECOG PERFORMANCE STATUS: 1  Physical Exam  Constitutional: Oriented to person, place, and time and well-developed, well-nourished, and in no distress.  HENT:  Head: Normocephalic and atraumatic.  Mouth/Throat: Oropharynx is clear and moist. No oropharyngeal exudate.  Eyes: Conjunctivae are normal. Right eye exhibits no discharge. Left eye exhibits no discharge. No scleral icterus.  Neck: Normal range of motion. Neck supple.  Cardiovascular: Normal rate, regular rhythm, normal heart sounds and intact distal pulses.   Pulmonary/Chest: Effort normal and breath sounds decreased. No respiratory distress. No wheezes. No rales.  Abdominal: Exhibits no distension  Musculoskeletal: back brace, uses walker. Exhibits no edema.   Lymphadenopathy:    No cervical adenopathy.  Neurological: Alert and oriented to person, place, and time. Exhibits normal muscle tone. Coordination normal.  Skin: Skin is warm and dry. No rash noted. Not diaphoretic. No erythema. No pallor.  Psychiatric: Mood, memory and judgment normal.  Vitals reviewed.  LABORATORY DATA: Lab Results  Component Value Date   WBC 5.4 11/07/2022   HGB 11.7 (L) 11/07/2022   HCT 33.7 (L) 11/07/2022   MCV 95.7 11/07/2022   PLT 181 11/07/2022      Chemistry      Component Value Date/Time   NA 135 11/07/2022 1108   NA 140 12/24/2020 1144   NA 142 08/02/2014 1649   K 3.7 11/07/2022 1108   K 4.2 08/02/2014 1649   CL 101 11/07/2022 1108   CL 109 (H) 08/02/2014 1649   CO2 28 11/07/2022 1108   CO2 21 08/02/2014 1649   BUN 10 11/07/2022 1108   BUN 7 12/24/2020 1144   BUN 11 08/02/2014 1649   CREATININE 0.56 (L) 11/07/2022 1108   CREATININE 0.92 08/02/2014 1649      Component Value Date/Time   CALCIUM 9.7 11/07/2022 1108   CALCIUM 8.6 08/02/2014 1649   ALKPHOS 107 11/07/2022 1108   ALKPHOS 82 08/02/2014 1649   AST 26 11/07/2022 1108   ALT 60 (H) 11/07/2022 1108   ALT 132 (H) 08/02/2014 1649   BILITOT 0.2 (L) 11/07/2022 1108       RADIOGRAPHIC STUDIES:  CT Chest W Contrast  Result Date: 11/03/2022 CLINICAL DATA:  A 56 year old male presents for evaluation of metastatic non-small cell lung cancer on follow-up. * Tracking Code: BO * EXAM: CT CHEST WITH CONTRAST TECHNIQUE: Multidetector CT imaging of the chest was performed during intravenous contrast administration. RADIATION DOSE REDUCTION: This exam was performed according to the departmental dose-optimization program which includes automated exposure control, adjustment of the mA and/or kV according to patient size and/or use of iterative reconstruction technique. CONTRAST:  68m OMNIPAQUE IOHEXOL 300 MG/ML  SOLN COMPARISON:  Comparison is made with June 28, 2022. And PET exam from September  of 2023 FINDINGS: Cardiovascular: Heart great vessels with scattered aortic atherosclerosis. RIGHT-sided Port-A-Cath terminating at the caval to atrial junction select caval to atrial junction mid RIGHT atrium, otherwise unremarkable on venous phase. Mediastinum/Nodes: No thoracic inlet, axillary, mediastinal or hilar adenopathy. Esophagus grossly normal. No discrete nodal disease, mild haziness of mediastinal tissue planes may be related to post treatment changes. 1 cm RIGHT hilar lymph node is all that remains of fullness about the RIGHT hilum seen on previous imaging. Lungs/Pleura: Signs of paraseptal and centrilobular pulmonary emphysema mild to moderate greatest at the lung apices. No signs of consolidation. No evidence of pleural  effusion. Areas of nodularity about the anterior RIGHT upper lobe have resolved. Airways are patent. Upper Abdomen: Incidental imaging of upper abdominal contents shows no acute process on limited assessment. No upper abdominal lymphadenopathy. Musculoskeletal: Ununited RIGHT clavicular fracture similar to previous imaging. Healed rib fractures about the bilateral chest also similar to prior imaging. Subtle sclerosis in the T3 vertebral body open parent image 89/6) this is new compared to previous imaging. IMPRESSION: 1. When compared to the August exam there is resolution of areas of nodularity throughout the RIGHT upper lobe medially and resolution of adenopathy seen throughout the chest. When compared to the study of September there is resolution of superimposed extensive ground-glass attenuation and bilateral pleural effusions. 2. Mild fullness about the RIGHT hilum persists, attention on follow-up. Overall findings are compatible with response to therapy. 3. New sclerosis at the T3 vertebral level not seen on previous imaging including prior PET scan. This is suspicious for new site of bony metastasis versus is treated disease developed in the interval. 4. Aortic atherosclerosis.  Aortic Atherosclerosis (ICD10-I70.0) and Emphysema (ICD10-J43.9). Electronically Signed   By: Zetta Bills M.D.   On: 11/03/2022 15:32     ASSESSMENT/PLAN:  This is a very pleasant 56 year old African-American male diagnosed with stage IV (T3, N2, M1 C) non-small cell lung cancer, adenocarcinoma.  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  He underwent biopsy and kyphoplasty to the L2 and to the brain metastasis.  He was diagnosed in September 2023.  His molecular studies showed no actionable mutations and his PD-L1 expression is 90%.  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    He underwent palliative radiation to L2 compression fracture as well as a solitary brain metastasis with SRS.   He is currently being treated with palliative systemic chemotherapy with carboplatin for an AUC of 5, Alimta 500 mg per metered square, Keytruda 200 mg IV every 3 weeks.  He status post 3 cycles and he tolerated well without any concerning adverse side effects.  The patient was seen with Dr. Julien Nordmann today.  The patient recently had a restaging CT scan performed.  Dr. Julien Nordmann personally and independently reviewed the scan and discussed the results with the patient today.  The scan showed a positive response to treatment that there was new sclerosis at T3 vertebrae not previously seen on prior imaging and PET scan.  Dr. Julien Nordmann recommends that he continue on treatment at the same dose we will monitor this T3 lesion. He will proceed with cycle #4 today as schedule.  We will see him back for follow-up visit in 3 weeks for evaluation repeat blood work before undergoing cycle #5.  He will continue to follow closely palliative care.  He is  scheduled to see them later today.  Overall, the patient reports his pain is significantly improved from when he was first diagnosed.  The patient's blood sugar is elevated today.  He has not been taking the insulin as prescribed because he has been trying to make his current prescription last him until he can get in touch with his PCP for refill.  We will call the PCPs office to see if we can help facilitate the refill request.  I also instructed the patient to call the pharmacy to  reach out to his PCP for a refill.  In the meantime, we will give him 10 units of insulin while in the infusion room today.  The patient was advised to call immediately if he has any concerning symptoms in the interval. The patient voices understanding of current disease status and treatment options and is in agreement with the current care plan. All questions were answered. The patient knows to call the clinic with any problems, questions or concerns. We can certainly see the patient much sooner if necessary    No orders of the defined types were placed in this encounter.     Maddalena Linarez L Lougenia Morrissey, PA-C 11/07/22  Expand All Collapse All  ADDENDUM: Hematology/Oncology Attending:  I had a face-to-face encounter with the patient today.  I reviewed his record, lab, scan and recommended his care plan.  This is a very pleasant 56 years old African-American male with a stage IV non-small cell lung cancer, adenocarcinoma diagnosed in September 2023 presented with right upper lobe pulmonary nodules in addition to right hilar and mediastinal lymphadenopathy, malignant pleural effusion, brain metastasis as well as extensive metastatic bone disease to the lumbar spine status post biopsy and kyphoplasty of L2 lesion as well as palliative radiotherapy to the brain metastasis.  The patient has no actionable mutations and PD-L1 expression is 90%.  He is currently undergoing palliative systemic chemotherapy with carboplatin,  Alimta and Keytruda status post 3 cycles.  The patient has been taking tolerating this treatment well with no concerning adverse effects. He had repeat CT scan of the chest,performed recently.  I personally and independently reviewed the scan images and discussed the result with the patient and his family member. His scan showed improvement of his disease with resolution of the nodularity throughout the right upper lobe medially and resolution of the adenopathy throughout the chest there was mild fullness about the right hilum and a new sclerosis at the T3 vertebral level that was not seen on the previous imaging studies including the PET scan and suspicious for new site of bony metastasis.  The patient is also scheduled for MRI of the lumbar spine in January 2023.  He may need additional MRI of the thoracic spine on the same day to rule out any new metastatic disease in the thoracic spine.  I will add this test to be done with the next scheduled MRI. I recommended for the patient to continue his current treatment with cycle #4 of carboplatin, Alimta and Keytruda and starting from cycle #5 he will be on maintenance treatment with Alimta and Keytruda every 3 weeks. The patient was advised to call immediately if he has any other concerning symptoms in the interval. The total time spent in the appointment was 30 minutes. Disclaimer: This note was dictated with voice recognition software. Similar sounding words can inadvertently be transcribed and may be missed upon review. Eilleen Kempf, MD

## 2022-11-04 MED FILL — Fosaprepitant Dimeglumine For IV Infusion 150 MG (Base Eq): INTRAVENOUS | Qty: 5 | Status: AC

## 2022-11-04 MED FILL — Dexamethasone Sodium Phosphate Inj 100 MG/10ML: INTRAMUSCULAR | Qty: 1 | Status: AC

## 2022-11-07 ENCOUNTER — Inpatient Hospital Stay: Payer: Medicaid Other

## 2022-11-07 ENCOUNTER — Inpatient Hospital Stay (HOSPITAL_BASED_OUTPATIENT_CLINIC_OR_DEPARTMENT_OTHER): Payer: Medicaid Other | Admitting: Nurse Practitioner

## 2022-11-07 ENCOUNTER — Encounter: Payer: Self-pay | Admitting: Nurse Practitioner

## 2022-11-07 ENCOUNTER — Inpatient Hospital Stay (HOSPITAL_BASED_OUTPATIENT_CLINIC_OR_DEPARTMENT_OTHER): Payer: Medicaid Other | Admitting: Physician Assistant

## 2022-11-07 ENCOUNTER — Other Ambulatory Visit: Payer: Medicaid Other

## 2022-11-07 VITALS — BP 132/82 | HR 90 | Temp 97.7°F | Resp 15 | Ht 68.0 in | Wt 128.8 lb

## 2022-11-07 DIAGNOSIS — Z5111 Encounter for antineoplastic chemotherapy: Secondary | ICD-10-CM

## 2022-11-07 DIAGNOSIS — C7931 Secondary malignant neoplasm of brain: Secondary | ICD-10-CM | POA: Diagnosis not present

## 2022-11-07 DIAGNOSIS — R739 Hyperglycemia, unspecified: Secondary | ICD-10-CM

## 2022-11-07 DIAGNOSIS — Z5112 Encounter for antineoplastic immunotherapy: Secondary | ICD-10-CM

## 2022-11-07 DIAGNOSIS — C3490 Malignant neoplasm of unspecified part of unspecified bronchus or lung: Secondary | ICD-10-CM

## 2022-11-07 DIAGNOSIS — G893 Neoplasm related pain (acute) (chronic): Secondary | ICD-10-CM

## 2022-11-07 DIAGNOSIS — Z515 Encounter for palliative care: Secondary | ICD-10-CM

## 2022-11-07 DIAGNOSIS — Z95828 Presence of other vascular implants and grafts: Secondary | ICD-10-CM

## 2022-11-07 DIAGNOSIS — C7951 Secondary malignant neoplasm of bone: Secondary | ICD-10-CM

## 2022-11-07 DIAGNOSIS — M792 Neuralgia and neuritis, unspecified: Secondary | ICD-10-CM

## 2022-11-07 LAB — CMP (CANCER CENTER ONLY)
ALT: 60 U/L — ABNORMAL HIGH (ref 0–44)
AST: 26 U/L (ref 15–41)
Albumin: 3.9 g/dL (ref 3.5–5.0)
Alkaline Phosphatase: 107 U/L (ref 38–126)
Anion gap: 6 (ref 5–15)
BUN: 10 mg/dL (ref 6–20)
CO2: 28 mmol/L (ref 22–32)
Calcium: 9.7 mg/dL (ref 8.9–10.3)
Chloride: 101 mmol/L (ref 98–111)
Creatinine: 0.56 mg/dL — ABNORMAL LOW (ref 0.61–1.24)
GFR, Estimated: >60 mL/min (ref >60)
Glucose, Bld: 427 mg/dL — ABNORMAL HIGH (ref 70–99)
Potassium: 3.7 mmol/L (ref 3.5–5.1)
Sodium: 135 mmol/L (ref 135–145)
Total Bilirubin: 0.2 mg/dL — ABNORMAL LOW (ref 0.3–1.2)
Total Protein: 6.6 g/dL (ref 6.5–8.1)

## 2022-11-07 LAB — CBC WITH DIFFERENTIAL (CANCER CENTER ONLY)
Abs Immature Granulocytes: 0.02 10*3/uL (ref 0.00–0.07)
Basophils Absolute: 0 10*3/uL (ref 0.0–0.1)
Basophils Relative: 0 %
Eosinophils Absolute: 0 10*3/uL (ref 0.0–0.5)
Eosinophils Relative: 0 %
HCT: 33.7 % — ABNORMAL LOW (ref 39.0–52.0)
Hemoglobin: 11.7 g/dL — ABNORMAL LOW (ref 13.0–17.0)
Immature Granulocytes: 0 %
Lymphocytes Relative: 21 %
Lymphs Abs: 1.1 10*3/uL (ref 0.7–4.0)
MCH: 33.2 pg (ref 26.0–34.0)
MCHC: 34.7 g/dL (ref 30.0–36.0)
MCV: 95.7 fL (ref 80.0–100.0)
Monocytes Absolute: 0.5 10*3/uL (ref 0.1–1.0)
Monocytes Relative: 10 %
Neutro Abs: 3.7 10*3/uL (ref 1.7–7.7)
Neutrophils Relative %: 69 %
Platelet Count: 181 10*3/uL (ref 150–400)
RBC: 3.52 MIL/uL — ABNORMAL LOW (ref 4.22–5.81)
RDW: 14.8 % (ref 11.5–15.5)
WBC Count: 5.4 10*3/uL (ref 4.0–10.5)
nRBC: 0 % (ref 0.0–0.2)

## 2022-11-07 MED ORDER — HEPARIN SOD (PORK) LOCK FLUSH 100 UNIT/ML IV SOLN
500.0000 [IU] | Freq: Once | INTRAVENOUS | Status: AC | PRN
  Administered 2022-11-07: 500 [IU]

## 2022-11-07 MED ORDER — SODIUM CHLORIDE 0.9 % IV SOLN
500.0000 mg/m2 | Freq: Once | INTRAVENOUS | Status: AC
  Administered 2022-11-07: 800 mg via INTRAVENOUS
  Filled 2022-11-07: qty 20

## 2022-11-07 MED ORDER — SODIUM CHLORIDE 0.9% FLUSH
10.0000 mL | INTRAVENOUS | Status: DC | PRN
  Administered 2022-11-07: 10 mL

## 2022-11-07 MED ORDER — SODIUM CHLORIDE 0.9 % IV SOLN
10.0000 mg | Freq: Once | INTRAVENOUS | Status: AC
  Administered 2022-11-07: 10 mg via INTRAVENOUS
  Filled 2022-11-07: qty 10

## 2022-11-07 MED ORDER — SODIUM CHLORIDE 0.9 % IV SOLN: Freq: Once | INTRAVENOUS | Status: AC

## 2022-11-07 MED ORDER — SODIUM CHLORIDE 0.9 % IV SOLN
517.0000 mg | Freq: Once | INTRAVENOUS | Status: AC
  Administered 2022-11-07: 520 mg via INTRAVENOUS
  Filled 2022-11-07: qty 52

## 2022-11-07 MED ORDER — SODIUM CHLORIDE 0.9 % IV SOLN
150.0000 mg | Freq: Once | INTRAVENOUS | Status: AC
  Administered 2022-11-07: 150 mg via INTRAVENOUS
  Filled 2022-11-07: qty 150

## 2022-11-07 MED ORDER — SODIUM CHLORIDE 0.9 % IV SOLN
200.0000 mg | Freq: Once | INTRAVENOUS | Status: AC
  Administered 2022-11-07: 200 mg via INTRAVENOUS
  Filled 2022-11-07: qty 200

## 2022-11-07 MED ORDER — GABAPENTIN 300 MG PO CAPS: 600.0000 mg | ORAL_CAPSULE | Freq: Three times a day (TID) | ORAL | 1 refills | Status: DC

## 2022-11-07 MED ORDER — SODIUM CHLORIDE 0.9% FLUSH
10.0000 mL | INTRAVENOUS | Status: AC | PRN
  Administered 2022-11-07: 10 mL

## 2022-11-07 MED ORDER — OXYCODONE HCL 5 MG PO TABS: 5.0000 mg | ORAL_TABLET | ORAL | 0 refills | Status: DC | PRN

## 2022-11-07 MED ORDER — INSULIN ASPART 100 UNIT/ML IJ SOLN
10.0000 [IU] | Freq: Once | INTRAMUSCULAR | Status: AC
  Administered 2022-11-07: 10 [IU] via SUBCUTANEOUS
  Filled 2022-11-07: qty 1

## 2022-11-07 MED ORDER — PALONOSETRON HCL INJECTION 0.25 MG/5ML
0.2500 mg | Freq: Once | INTRAVENOUS | Status: AC
  Administered 2022-11-07: 0.25 mg via INTRAVENOUS
  Filled 2022-11-07: qty 5

## 2022-11-07 NOTE — Progress Notes (Signed)
[facility]  Telephone:(336) [phone] Fax:(336) [phone]   Name: Samuel [name] Date: 11/07/2022 MRN: [id number]  DOB: 1966-07-16  Patient Care Team: Martinique, [name], MD as PCP - General (Family Medicine) Camillia Herter, NP as Nurse Practitioner (Nurse Practitioner) Debera Lat, Carlena Sax, NP as Nurse Practitioner (Nurse Practitioner)    INTERVAL HISTORY: Samuel [name] is a 56 y.o. 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  Palliative ask to see for symptom management and goals of care.   SOCIAL HISTORY:     reports that he quit smoking about 2 months ago. His smoking use included cigarettes. He smoked an average of 1 pack per day. He has never used smokeless tobacco. He reports that he does not currently use alcohol after a past usage of about 50.0 standard drinks of alcohol per week. He reports that he does not use drugs.  ADVANCE DIRECTIVES:  Patient does not have an advanced directives. Education and packet provided. He has expressed interest in completing. States his sister, Samuel [name] would be his designated Scientist, research (medical).   CODE STATUS: Full code  PAST MEDICAL HISTORY: Past Medical History:  Diagnosis Date   Diabetes mellitus without complication ([place])    Metastatic cancer to brain ([facility]) 08/05/2022   Metastatic cancer to spine ([facility]) 07/19/2022   Pancreatitis unk   Primary lung adenocarcinoma ([facility]) 08/05/2022    ALLERGIES:  is allergic to penicillins and percocet [oxycodone-acetaminophen].  MEDICATIONS:  Current Outpatient Medications  Medication Sig Dispense Refill   acetaminophen (TYLENOL) 500 MG tablet Take 1,000 mg by mouth every 6 (six) hours as needed for mild pain or moderate pain.     atorvastatin (LIPITOR) 40 MG tablet Take 1 tablet (40 mg total) by mouth daily. 30 tablet  2   blood glucose meter kit and supplies KIT Dispense based on patient and insurance preference. Use up to four times daily as directed. 1 each 0   docusate sodium (COLACE) 100 MG capsule Take 1 capsule (100 mg total) by mouth 2 (two) times daily. 30 capsule 0   Fingerstix Lancets MISC use as directed 013 each 0   folic acid (FOLVITE) 1 MG tablet Take 1 tablet (1 mg total) by mouth daily. 30 tablet 4   gabapentin (NEURONTIN) 300 MG capsule Take 2 capsules (600 mg total) by mouth 3 (three) times daily. 180 capsule 1   glucose blood test strip use as directed 100 each 0   Insulin Lispro Prot & Lispro (HUMALOG 75/25 MIX) (75-25) 100 UNIT/ML Kwikpen Inject 10 units into the skin 2 times daily with a meal 15 mL 5   Insulin Pen Needle 32G X 4 MM MISC Use with Insulin pen 100 each 3   ipratropium-albuterol (DUONEB) 0.5-2.5 (3) MG/3ML SOLN Take 3 mLs by nebulization every 6 (six) hours as needed (Shortness of breath). 360 mL 0   lidocaine-prilocaine (EMLA) cream Apply to the Port-A-Cath site 30-60 minutes before chemo. 30 g 0   morphine (MS CONTIN) 15 MG 12 hr tablet Take 1 tablet (15 mg total) by mouth every 12 (twelve) hours. 60 tablet 0   oxyCODONE (OXY IR/ROXICODONE) 5 MG immediate release tablet Take 1-2 tablets (5-10 mg total) by mouth every 4 (four) hours as needed for severe pain. 120 tablet 0   polyethylene glycol powder (GLYCOLAX/MIRALAX) 17 GM/SCOOP powder Take 1 capful  (  17 g) with water by mouth daily as needed. 238 g 0   prochlorperazine (COMPAZINE) 10 MG tablet Take 1 tablet (10 mg total) by mouth every 6 (six) hours as needed for nausea or vomiting. 30 tablet 0   No current facility-administered medications for this visit.   Facility-Administered Medications Ordered in Other Visits  Medication Dose Route Frequency Provider Last Rate Last Admin   CARBOplatin (PARAPLATIN) 520 mg in sodium chloride 0.9 % 250 mL chemo infusion  520 mg Intravenous Once Curt Bears, MD 604 mL/hr at  11/07/22 1431 520 mg at 11/07/22 1431   heparin lock flush 100 unit/mL  500 Units Intracatheter Once PRN Curt Bears, MD       sodium chloride flush (NS) 0.9 % injection 10 mL  10 mL Intracatheter PRN Curt Bears, MD        VITAL SIGNS: There were no vitals taken for this visit. There were no vitals filed for this visit.  Estimated body mass index is 19.58 kg/m as calculated from the following:   Height as of an earlier encounter on 11/07/22: _0  (1.727 m).   Weight as of an earlier encounter on 11/07/22: 128 lb 12.8 oz (58.4 kg).   PERFORMANCE STATUS (ECOG) : 1 - Symptomatic but completely ambulatory   Physical Exam General: NAD Cardiovascular: regular rate and rhythm Pulmonary:normal breathing pattern  Extremities: no edema, no joint deformities, brace in place  Skin: no rashes Neurological: AAO x3  IMPRESSION: Samuel [name] presents to clinic today for follow-up. No acute distress noted. His sister is present with him today. Tolerating treatments. Denies concerns with constipation, diarrhea, nausea, or vomiting. Appetite continues to improve. He has gained 2lbs. Samuel [name] speaks to his appreciation of feeling better. He continues to be able to increase his activity levels. Looking forward to spending time with his family over the Christmas holidays.   Neoplasm related pain Samuel [name] reports pain is well controlled. Tolerating MS Contin and oxycodone for breakthrough pain. He is only taking MS Contin at bedtime given improvement in pain. Requires Oxycodone 1-2 times daily. Is able to sleep and increase activity around the home.   Patient pain contract was signed at previous visit on 10/23.  We will continue to closely monitor. He understands as he continues to do well we will consider further weaning down medications.   PLAN: MS Contin 15 mg at bedtime only  Oxy IR 5-10 mg every 4-6 hours as needed for breakthrough pain Colace twice daily Narcan for emergency use Pain  contract signed on 10/23. I will plan to see patient back in 3-4 weeks in collaboration with his other oncology appointments.   Patient expressed understanding and was in agreement with this plan. He also understands that He can call the clinic at any time with any questions, concerns, or complaints.     Any controlled substances utilized were prescribed in the context of palliative care. PDMP has been reviewed.    Time Total: 30 min   Visit consisted of counseling and education dealing with the complex and emotionally intense issues of symptom management and palliative care in the setting of serious and potentially life-threatening illness.Greater than 50%  of this time was spent counseling and coordinating care related to the above assessment and plan.  Alda Lea, AGPCNP-BC  Palliative Medicine Team/[facility]

## 2022-11-07 NOTE — Progress Notes (Signed)
Pt CBG rechecked after 10 units of insulin (ordered per Cassie PA). Repeat CBG 185.

## 2022-11-07 NOTE — Patient Instructions (Signed)
[facility]  Discharge Instructions: Thank you for choosing [facility] to provide your oncology and hematology care.   If you have a lab appointment with the [facility], please go directly to the [facility] and check in at the registration area.   Wear comfortable clothing and clothing appropriate for easy access to any Portacath or PICC line.   We strive to give you quality time with your provider. You may need to reschedule your appointment if you arrive late (15 or more minutes).  Arriving late affects you and other patients whose appointments are after yours.  Also, if you miss three or more appointments without notifying the office, you may be dismissed from the clinic at the provider's discretion.      For prescription refill requests, have your pharmacy contact our office and allow 72 hours for refills to be completed.    Today you received the following chemotherapy and/or immunotherapy agents: Keytruda, Alimta, Carboplatin.       To help prevent nausea and vomiting after your treatment, we encourage you to take your nausea medication as directed.  BELOW ARE SYMPTOMS THAT SHOULD BE REPORTED IMMEDIATELY: *FEVER GREATER THAN 100.4 F (38 C) OR HIGHER *CHILLS OR SWEATING *NAUSEA AND VOMITING THAT IS NOT CONTROLLED WITH YOUR NAUSEA MEDICATION *UNUSUAL SHORTNESS OF BREATH *UNUSUAL BRUISING OR BLEEDING *URINARY PROBLEMS (pain or burning when urinating, or frequent urination) *BOWEL PROBLEMS (unusual diarrhea, constipation, pain near the anus) TENDERNESS IN MOUTH AND THROAT WITH OR WITHOUT PRESENCE OF ULCERS (sore throat, sores in mouth, or a toothache) UNUSUAL RASH, SWELLING OR PAIN  UNUSUAL VAGINAL DISCHARGE OR ITCHING   Items with * indicate a potential emergency and should be followed up as soon as possible or go to the Emergency Department if any problems should occur.  Please show the CHEMOTHERAPY ALERT CARD or IMMUNOTHERAPY  ALERT CARD at check-in to the Emergency Department and triage nurse.  Should you have questions after your visit or need to cancel or reschedule your appointment, please contact [facility]  Dept: [phone]  and follow the prompts.  Office hours are 8:00 a.m. to 4:30 p.m. Monday - Friday. Please note that voicemails left after 4:00 p.m. may not be returned until the following business day.  We are closed weekends and major holidays. You have access to a nurse at all times for urgent questions. Please call the main number to the clinic Dept: [phone] and follow the prompts.   For any non-urgent questions, you may also contact your provider using MyChart. We now offer e-Visits for anyone 69 and older to request care online for non-urgent symptoms. For details visit [email or web address].   Also download the MyChart app! Go to the app store, search "MyChart", open the app, select [facility], and log in with your MyChart username and password.  Masks are optional in the cancer centers. If you would like for your care team to wear a mask while they are taking care of you, please let them know. You may have one support person who is at least 58 years old accompany you for your appointments.

## 2022-11-08 ENCOUNTER — Encounter: Payer: Self-pay | Admitting: Internal Medicine

## 2022-11-15 ENCOUNTER — Other Ambulatory Visit: Payer: Medicaid Other

## 2022-11-15 ENCOUNTER — Inpatient Hospital Stay: Payer: Medicaid Other

## 2022-11-16 ENCOUNTER — Telehealth: Payer: Self-pay | Admitting: Internal Medicine

## 2022-11-16 NOTE — Telephone Encounter (Signed)
Patient called to r/s missed appointment. Patient notified.

## 2022-11-17 ENCOUNTER — Inpatient Hospital Stay: Payer: Medicaid Other

## 2022-11-17 ENCOUNTER — Other Ambulatory Visit: Payer: Self-pay

## 2022-11-17 DIAGNOSIS — C3490 Malignant neoplasm of unspecified part of unspecified bronchus or lung: Secondary | ICD-10-CM

## 2022-11-17 DIAGNOSIS — C7931 Secondary malignant neoplasm of brain: Secondary | ICD-10-CM | POA: Diagnosis not present

## 2022-11-17 LAB — CBC WITH DIFFERENTIAL (CANCER CENTER ONLY)
Abs Immature Granulocytes: 0.03 10*3/uL (ref 0.00–0.07)
Basophils Absolute: 0 10*3/uL (ref 0.0–0.1)
Basophils Relative: 0 %
Eosinophils Absolute: 0 10*3/uL (ref 0.0–0.5)
Eosinophils Relative: 1 %
HCT: 34.4 % — ABNORMAL LOW (ref 39.0–52.0)
Hemoglobin: 12.4 g/dL — ABNORMAL LOW (ref 13.0–17.0)
Immature Granulocytes: 1 %
Lymphocytes Relative: 28 %
Lymphs Abs: 1 10*3/uL (ref 0.7–4.0)
MCH: 33.4 pg (ref 26.0–34.0)
MCHC: 36 g/dL (ref 30.0–36.0)
MCV: 92.7 fL (ref 80.0–100.0)
Monocytes Absolute: 0.6 10*3/uL (ref 0.1–1.0)
Monocytes Relative: 17 %
Neutro Abs: 1.8 10*3/uL (ref 1.7–7.7)
Neutrophils Relative %: 53 %
Platelet Count: 178 10*3/uL (ref 150–400)
RBC: 3.71 MIL/uL — ABNORMAL LOW (ref 4.22–5.81)
RDW: 13.5 % (ref 11.5–15.5)
WBC Count: 3.4 10*3/uL — ABNORMAL LOW (ref 4.0–10.5)
nRBC: 0 % (ref 0.0–0.2)

## 2022-11-17 LAB — CMP (CANCER CENTER ONLY)
ALT: 33 U/L (ref 0–44)
AST: 18 U/L (ref 15–41)
Albumin: 4.1 g/dL (ref 3.5–5.0)
Alkaline Phosphatase: 91 U/L (ref 38–126)
Anion gap: 7 (ref 5–15)
BUN: 13 mg/dL (ref 6–20)
CO2: 28 mmol/L (ref 22–32)
Calcium: 9.8 mg/dL (ref 8.9–10.3)
Chloride: 99 mmol/L (ref 98–111)
Creatinine: 0.51 mg/dL — ABNORMAL LOW (ref 0.61–1.24)
GFR, Estimated: >60 mL/min (ref >60)
Glucose, Bld: 434 mg/dL — ABNORMAL HIGH (ref 70–99)
Potassium: 3.5 mmol/L (ref 3.5–5.1)
Sodium: 134 mmol/L — ABNORMAL LOW (ref 135–145)
Total Bilirubin: 0.2 mg/dL — ABNORMAL LOW (ref 0.3–1.2)
Total Protein: 7.4 g/dL (ref 6.5–8.1)

## 2022-11-17 MED ORDER — HEPARIN SOD (PORK) LOCK FLUSH 100 UNIT/ML IV SOLN
500.0000 [IU] | Freq: Once | INTRAVENOUS | Status: AC
  Administered 2022-11-17: 500 [IU] via INTRAVENOUS

## 2022-11-17 MED ORDER — SODIUM CHLORIDE 0.9% FLUSH
10.0000 mL | Freq: Once | INTRAVENOUS | Status: AC
  Administered 2022-11-17: 10 mL via INTRAVENOUS

## 2022-11-22 ENCOUNTER — Other Ambulatory Visit: Payer: Medicaid Other

## 2022-11-22 ENCOUNTER — Inpatient Hospital Stay: Payer: Medicaid Other | Attending: Adult Health

## 2022-11-22 DIAGNOSIS — Z87891 Personal history of nicotine dependence: Secondary | ICD-10-CM | POA: Insufficient documentation

## 2022-11-22 DIAGNOSIS — C7931 Secondary malignant neoplasm of brain: Secondary | ICD-10-CM | POA: Insufficient documentation

## 2022-11-22 DIAGNOSIS — C3491 Malignant neoplasm of unspecified part of right bronchus or lung: Secondary | ICD-10-CM | POA: Insufficient documentation

## 2022-11-22 DIAGNOSIS — Z5111 Encounter for antineoplastic chemotherapy: Secondary | ICD-10-CM | POA: Insufficient documentation

## 2022-11-22 DIAGNOSIS — E1165 Type 2 diabetes mellitus with hyperglycemia: Secondary | ICD-10-CM | POA: Insufficient documentation

## 2022-11-22 DIAGNOSIS — C7951 Secondary malignant neoplasm of bone: Secondary | ICD-10-CM | POA: Insufficient documentation

## 2022-11-22 DIAGNOSIS — G893 Neoplasm related pain (acute) (chronic): Secondary | ICD-10-CM | POA: Insufficient documentation

## 2022-11-22 DIAGNOSIS — Z79899 Other long term (current) drug therapy: Secondary | ICD-10-CM | POA: Insufficient documentation

## 2022-11-22 DIAGNOSIS — Z5112 Encounter for antineoplastic immunotherapy: Secondary | ICD-10-CM | POA: Insufficient documentation

## 2022-11-22 DIAGNOSIS — Z794 Long term (current) use of insulin: Secondary | ICD-10-CM | POA: Insufficient documentation

## 2022-11-22 DIAGNOSIS — Z79891 Long term (current) use of opiate analgesic: Secondary | ICD-10-CM | POA: Insufficient documentation

## 2022-11-22 DIAGNOSIS — Z923 Personal history of irradiation: Secondary | ICD-10-CM | POA: Insufficient documentation

## 2022-11-23 ENCOUNTER — Ambulatory Visit (HOSPITAL_COMMUNITY): Payer: Medicaid Other

## 2022-11-24 ENCOUNTER — Ambulatory Visit (HOSPITAL_COMMUNITY): Payer: Medicaid Other

## 2022-11-24 ENCOUNTER — Ambulatory Visit (HOSPITAL_COMMUNITY): Admission: RE | Admit: 2022-11-24 | Payer: Medicaid Other | Source: Ambulatory Visit

## 2022-11-25 ENCOUNTER — Ambulatory Visit (HOSPITAL_COMMUNITY)
Admission: RE | Admit: 2022-11-25 | Discharge: 2022-11-25 | Disposition: A | Discharge status: Home / Self Care | Payer: Medicaid Other | Source: Ambulatory Visit | Attending: Internal Medicine | Admitting: Internal Medicine

## 2022-11-25 ENCOUNTER — Ambulatory Visit (HOSPITAL_COMMUNITY)
Admission: RE | Admit: 2022-11-25 | Discharge: 2022-11-25 | Disposition: A | Discharge status: Home / Self Care | Payer: Medicaid Other | Source: Ambulatory Visit | Attending: Radiation Oncology | Admitting: Radiation Oncology

## 2022-11-25 DIAGNOSIS — C7931 Secondary malignant neoplasm of brain: Secondary | ICD-10-CM

## 2022-11-25 DIAGNOSIS — C3490 Malignant neoplasm of unspecified part of unspecified bronchus or lung: Secondary | ICD-10-CM | POA: Diagnosis not present

## 2022-11-25 DIAGNOSIS — C7949 Secondary malignant neoplasm of other parts of nervous system: Secondary | ICD-10-CM | POA: Insufficient documentation

## 2022-11-25 MED ORDER — GADOBUTROL 1 MMOL/ML IV SOLN
5.0000 mL | Freq: Once | INTRAVENOUS | Status: AC | PRN
  Administered 2022-11-25: 5 mL via INTRAVENOUS

## 2022-11-28 ENCOUNTER — Inpatient Hospital Stay (HOSPITAL_BASED_OUTPATIENT_CLINIC_OR_DEPARTMENT_OTHER): Payer: Medicaid Other | Admitting: Nurse Practitioner

## 2022-11-28 ENCOUNTER — Telehealth: Payer: Self-pay | Admitting: Medical Oncology

## 2022-11-28 ENCOUNTER — Inpatient Hospital Stay (HOSPITAL_BASED_OUTPATIENT_CLINIC_OR_DEPARTMENT_OTHER): Payer: Medicaid Other | Admitting: Internal Medicine

## 2022-11-28 ENCOUNTER — Inpatient Hospital Stay: Payer: Medicaid Other

## 2022-11-28 ENCOUNTER — Other Ambulatory Visit: Payer: Self-pay

## 2022-11-28 ENCOUNTER — Other Ambulatory Visit: Payer: Medicaid Other

## 2022-11-28 ENCOUNTER — Encounter: Payer: Self-pay | Admitting: Nurse Practitioner

## 2022-11-28 VITALS — BP 114/80 | HR 94 | Temp 97.7°F | Resp 17 | Wt 136.0 lb

## 2022-11-28 DIAGNOSIS — Z515 Encounter for palliative care: Secondary | ICD-10-CM | POA: Diagnosis not present

## 2022-11-28 DIAGNOSIS — Z79891 Long term (current) use of opiate analgesic: Secondary | ICD-10-CM | POA: Diagnosis not present

## 2022-11-28 DIAGNOSIS — Z5111 Encounter for antineoplastic chemotherapy: Secondary | ICD-10-CM | POA: Diagnosis not present

## 2022-11-28 DIAGNOSIS — G893 Neoplasm related pain (acute) (chronic): Secondary | ICD-10-CM

## 2022-11-28 DIAGNOSIS — Z87891 Personal history of nicotine dependence: Secondary | ICD-10-CM | POA: Diagnosis not present

## 2022-11-28 DIAGNOSIS — C7931 Secondary malignant neoplasm of brain: Secondary | ICD-10-CM | POA: Diagnosis not present

## 2022-11-28 DIAGNOSIS — C3490 Malignant neoplasm of unspecified part of unspecified bronchus or lung: Secondary | ICD-10-CM

## 2022-11-28 DIAGNOSIS — Z794 Long term (current) use of insulin: Secondary | ICD-10-CM | POA: Diagnosis not present

## 2022-11-28 DIAGNOSIS — E1165 Type 2 diabetes mellitus with hyperglycemia: Secondary | ICD-10-CM | POA: Diagnosis not present

## 2022-11-28 DIAGNOSIS — Z923 Personal history of irradiation: Secondary | ICD-10-CM | POA: Diagnosis not present

## 2022-11-28 DIAGNOSIS — C7951 Secondary malignant neoplasm of bone: Secondary | ICD-10-CM | POA: Diagnosis not present

## 2022-11-28 DIAGNOSIS — Z5112 Encounter for antineoplastic immunotherapy: Secondary | ICD-10-CM | POA: Diagnosis present

## 2022-11-28 DIAGNOSIS — R630 Anorexia: Secondary | ICD-10-CM | POA: Diagnosis not present

## 2022-11-28 DIAGNOSIS — C3491 Malignant neoplasm of unspecified part of right bronchus or lung: Secondary | ICD-10-CM | POA: Diagnosis not present

## 2022-11-28 DIAGNOSIS — Z79899 Other long term (current) drug therapy: Secondary | ICD-10-CM | POA: Diagnosis not present

## 2022-11-28 LAB — CBC WITH DIFFERENTIAL (CANCER CENTER ONLY)
Abs Immature Granulocytes: 0.01 10*3/uL (ref 0.00–0.07)
Basophils Absolute: 0 10*3/uL (ref 0.0–0.1)
Basophils Relative: 0 %
Eosinophils Absolute: 0 10*3/uL (ref 0.0–0.5)
Eosinophils Relative: 0 %
HCT: 33.8 % — ABNORMAL LOW (ref 39.0–52.0)
Hemoglobin: 11.9 g/dL — ABNORMAL LOW (ref 13.0–17.0)
Immature Granulocytes: 0 %
Lymphocytes Relative: 18 %
Lymphs Abs: 0.9 10*3/uL (ref 0.7–4.0)
MCH: 33.7 pg (ref 26.0–34.0)
MCHC: 35.2 g/dL (ref 30.0–36.0)
MCV: 95.8 fL (ref 80.0–100.0)
Monocytes Absolute: 0.6 10*3/uL (ref 0.1–1.0)
Monocytes Relative: 11 %
Neutro Abs: 3.8 10*3/uL (ref 1.7–7.7)
Neutrophils Relative %: 71 %
Platelet Count: 145 10*3/uL — ABNORMAL LOW (ref 150–400)
RBC: 3.53 MIL/uL — ABNORMAL LOW (ref 4.22–5.81)
RDW: 14.8 % (ref 11.5–15.5)
WBC Count: 5.3 10*3/uL (ref 4.0–10.5)
nRBC: 0 % (ref 0.0–0.2)

## 2022-11-28 LAB — CMP (CANCER CENTER ONLY)
ALT: 33 U/L (ref 0–44)
AST: 19 U/L (ref 15–41)
Albumin: 3.8 g/dL (ref 3.5–5.0)
Alkaline Phosphatase: 82 U/L (ref 38–126)
Anion gap: 4 — ABNORMAL LOW (ref 5–15)
BUN: 9 mg/dL (ref 6–20)
CO2: 29 mmol/L (ref 22–32)
Calcium: 9.2 mg/dL (ref 8.9–10.3)
Chloride: 101 mmol/L (ref 98–111)
Creatinine: 0.44 mg/dL — ABNORMAL LOW (ref 0.61–1.24)
GFR, Estimated: >60 mL/min (ref >60)
Glucose, Bld: 223 mg/dL — ABNORMAL HIGH (ref 70–99)
Potassium: 3.5 mmol/L (ref 3.5–5.1)
Sodium: 134 mmol/L — ABNORMAL LOW (ref 135–145)
Total Bilirubin: 0.3 mg/dL (ref 0.3–1.2)
Total Protein: 6.3 g/dL — ABNORMAL LOW (ref 6.5–8.1)

## 2022-11-28 MED ORDER — SODIUM CHLORIDE 0.9 % IV SOLN
500.0000 mg/m2 | Freq: Once | INTRAVENOUS | Status: AC
  Administered 2022-11-28: 800 mg via INTRAVENOUS
  Filled 2022-11-28: qty 20

## 2022-11-28 MED ORDER — SODIUM CHLORIDE 0.9 % IV SOLN
200.0000 mg | Freq: Once | INTRAVENOUS | Status: AC
  Administered 2022-11-28: 200 mg via INTRAVENOUS
  Filled 2022-11-28: qty 200

## 2022-11-28 MED ORDER — HEPARIN SOD (PORK) LOCK FLUSH 100 UNIT/ML IV SOLN
500.0000 [IU] | Freq: Once | INTRAVENOUS | Status: AC | PRN
  Administered 2022-11-28: 500 [IU]

## 2022-11-28 MED ORDER — SODIUM CHLORIDE 0.9 % IV SOLN: Freq: Once | INTRAVENOUS | Status: AC

## 2022-11-28 MED ORDER — PROCHLORPERAZINE MALEATE 10 MG PO TABS
10.0000 mg | ORAL_TABLET | Freq: Once | ORAL | Status: AC
  Administered 2022-11-28: 10 mg via ORAL
  Filled 2022-11-28: qty 1

## 2022-11-28 MED ORDER — SODIUM CHLORIDE 0.9% FLUSH
10.0000 mL | Freq: Once | INTRAVENOUS | Status: AC
  Administered 2022-11-28: 10 mL

## 2022-11-28 MED ORDER — SODIUM CHLORIDE 0.9% FLUSH
10.0000 mL | INTRAVENOUS | Status: DC | PRN
  Administered 2022-11-28: 10 mL

## 2022-11-28 NOTE — Progress Notes (Signed)
[facility] [phone]   [phone]  OFFICE PROGRESS NOTE  [place], [name], MD 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  Biomarker Findings Microsatellite status - MS-Stable Tumor Mutational Burden - 8 Muts/Mb Genomic Findings For a complete list of the genes assayed, please refer to the Appendix. KEAP1 Q227* CDKN2A/B p16INK4a G89V PRKCI amplification TERC amplification - equivocal? TP53 D245fs*18 8 Disease relevant genes with no reportable alterations: ALK, BRAF, EGFR, ERBB2, KRAS, MET, RET, ROS1  PDL1 TPS  90%  PRIOR THERAPY:  1) Status post palliative radiotherapy to the L2 lesion under the care of Dr. Tammi Klippel. 2) Status post SRS to solitary brain metastasis under the care of Dr. Tammi Klippel  CURRENT THERAPY: Palliative systemic therapy with carboplatin for AUC of 5, Alimta 500 Mg/M2 and Keytruda 200 Mg IV every 3 weeks.  First dose September 05, 2022.  Status post 4 cycles.  Starting from cycle #5 the patient will be on maintenance treatment with Alimta and Keytruda every 3 weeks.  INTERVAL HISTORY: Samuel [name] 57 y.o. male returns to the clinic today for follow-up visit.  The patient is feeling fine today with no concerning complaints except for mild back pain.  He denied having any current chest pain, shortness of breath, cough or hemoptysis.  He has no nausea, vomiting, diarrhea or constipation.  He has no headache or visual changes.  He denied having any recent weight loss or night sweats.  He continues to tolerate his treatment fairly well.  He is here  today for evaluation before starting cycle #5.  He had MRI of the brain as well as MRI of the thoracolumbar spine performed few days ago and that showed no concerning findings for disease progression.  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  MEDICATIONS:  Current Outpatient Medications  Medication Sig Dispense Refill   acetaminophen (TYLENOL) 500 MG tablet Take 1,000 mg by mouth every 6 (six) hours as needed for mild pain or moderate pain.     atorvastatin (LIPITOR) 40 MG tablet Take 1 tablet (40 mg total) by mouth daily. 30 tablet 2   blood glucose meter kit and supplies KIT Dispense based on patient and insurance preference. Use up to four times daily as directed. 1 each 0   docusate sodium (COLACE) 100 MG capsule Take 1 capsule (100 mg total) by mouth 2 (two) times daily. 30 capsule 0   Fingerstix Lancets MISC use as directed 382 each 0   folic acid (FOLVITE) 1 MG tablet Take 1 tablet (1 mg total) by mouth daily. 30 tablet 4   gabapentin (NEURONTIN) 300 MG capsule Take 2 capsules (600 mg total) by mouth 3 (three) times daily. 180 capsule 1   glucose blood test strip use as directed 100 each 0   Insulin Lispro Prot & Lispro (HUMALOG 75/25 MIX) (75-25) 100 UNIT/ML  Kwikpen Inject 10 units into the skin 2 times daily with a meal 15 mL 5   Insulin Pen Needle 32G X 4 MM MISC Use with Insulin pen 100 each 3   ipratropium-albuterol (DUONEB) 0.5-2.5 (3) MG/3ML SOLN Take 3 mLs by nebulization every 6 (six) hours as needed (Shortness of breath). 360 mL 0   lidocaine-prilocaine (EMLA) cream Apply to the Port-A-Cath site 30-60 minutes before chemo. 30 g 0   morphine (MS CONTIN) 15 MG 12 hr tablet Take  1 tablet (15 mg total) by mouth every 12 (twelve) hours. 60 tablet 0   oxyCODONE (OXY IR/ROXICODONE) 5 MG immediate release tablet Take 1-2 tablets (5-10 mg total) by mouth every 4 (four) hours as needed for severe pain. 120 tablet 0   polyethylene glycol powder (GLYCOLAX/MIRALAX) 17 GM/SCOOP powder Take 1 capful  (17 g) with water by mouth daily as needed. 238 g 0   prochlorperazine (COMPAZINE) 10 MG tablet Take 1 tablet (10 mg total) by mouth every 6 (six) hours as needed for nausea or vomiting. 30 tablet 0   No current facility-administered medications for this visit.    SURGICAL HISTORY:  Past Surgical History:  Procedure Laterality Date   IR BONE TUMOR(S)RF ABLATION  08/01/2022   IR IMAGING GUIDED PORT INSERTION  09/21/2022   IR KYPHO LUMBAR INC FX REDUCE BONE BX UNI/BIL CANNULATION INC/IMAGING  08/01/2022    REVIEW OF SYSTEMS:  A comprehensive review of systems was negative except for: Musculoskeletal: positive for back pain   PHYSICAL EXAMINATION: General appearance: alert, cooperative, fatigued, and no distress Head: Normocephalic, without obvious abnormality, atraumatic Neck: no adenopathy, no JVD, supple, symmetrical, trachea midline, and thyroid not enlarged, symmetric, no tenderness/mass/nodules Lymph nodes: Cervical, supraclavicular, and axillary nodes normal. Resp: clear to auscultation bilaterally Back: symmetric, no curvature. ROM normal. No CVA tenderness. Cardio: regular rate and rhythm, S1, S2 normal, no murmur, click, rub or gallop GI: soft, non-tender; bowel sounds normal; no masses,  no organomegaly Extremities: extremities normal, atraumatic, no cyanosis or edema  ECOG PERFORMANCE STATUS: 1 - Symptomatic but completely ambulatory  Blood pressure 114/80, pulse 94, temperature 97.7 F (36.5 C), temperature source Oral, resp. rate 17, weight 136 lb (61.7 kg), SpO2 100 %.  LABORATORY DATA: Lab Results  Component Value Date   WBC 3.4 (L) 11/17/2022   HGB 12.4 (L)  11/17/2022   HCT 34.4 (L) 11/17/2022   MCV 92.7 11/17/2022   PLT 178 11/17/2022      Chemistry      Component Value Date/Time   NA 134 (L) 11/17/2022 1010   NA 140 12/24/2020 1144   NA 142 08/02/2014 1649   K 3.5 11/17/2022 1010   K 4.2 08/02/2014 1649   CL 99 11/17/2022 1010   CL 109 (H) 08/02/2014 1649   CO2 28 11/17/2022 1010   CO2 21 08/02/2014 1649   BUN 13 11/17/2022 1010   BUN 7 12/24/2020 1144   BUN 11 08/02/2014 1649   CREATININE 0.51 (L) 11/17/2022 1010   CREATININE 0.92 08/02/2014 1649      Component Value Date/Time   CALCIUM 9.8 11/17/2022 1010   CALCIUM 8.6 08/02/2014 1649   ALKPHOS 91 11/17/2022 1010   ALKPHOS 82 08/02/2014 1649   AST 18 11/17/2022 1010   ALT 33 11/17/2022 1010   ALT 132 (H) 08/02/2014 1649   BILITOT 0.2 (L) 11/17/2022 1010       RADIOGRAPHIC STUDIES: MR Brain W Wo Contrast  Result Date: 11/28/2022 CLINICAL DATA:  Brain metastases, assess treatment response 3T SRS Protocol EXAM: MRI HEAD WITHOUT AND WITH CONTRAST TECHNIQUE: Multiplanar, multiecho pulse sequences of the brain and surrounding structures were obtained without and with intravenous contrast. CONTRAST:  43mL GADAVIST GADOBUTROL 1 MMOL/ML IV SOLN COMPARISON:  MRI 08/05/2012. FINDINGS: Brain: No evidence of acute infarct, acute hemorrhage, midline shift, hydrocephalus or extra-axial fluid collection. Punctate right cerebellar metastasis is not substantially changed (series [id number], image 120). Left temporal and right occipital metastases are no longer seen. No new enhancing lesions identified. Vascular: Major arterial flow voids are maintained at the skull base. Skull and upper cervical spine: Normal marrow signal. Sinuses/Orbits: Largely clear sinuses.  No acute orbital findings. Other: No mastoid effusions. IMPRESSION: Punctate right cerebellar metastasis is not substantially changed. Left temporal and right occipital metastases are no longer seen. No new enhancing lesions identified.  Electronically Signed   By: Margaretha Sheffield M.D.   On: 11/28/2022 08:47   MR Lumbar Spine W Wo Contrast  Result Date: 11/27/2022 CLINICAL DATA:  History of metastatic adenocarcinoma with a known pathologic fracture of L2 with the patient is status post vertebral augmentation. Staging examination. EXAM: MRI THORACIC AND LUMBAR SPINE WITHOUT AND WITH CONTRAST TECHNIQUE: Multiplanar and multiecho pulse sequences of the thoracic and lumbar spine were obtained without and with intravenous contrast. CONTRAST:  5 mL GADAVIST IV SOLN COMPARISON:  PET CT scan 08/04/2022. FINDINGS: MRI THORACIC SPINE FINDINGS Alignment:  Normal. Vertebrae: There is an enhancing lesion in the right side of the T3 vertebral body measuring 1 cm transverse by 0.9 cm AP by 0.7 cm craniocaudal consistent with a metastatic deposit. A 0.8 cm in diameter metastatic deposit is identified in the posterior margin of T9 in a right paracentral position. No other evidence of metastatic disease is identified. No fracture. Cord:  Normal signal throughout.  Negative for epidural tumor. Paraspinal and other soft tissues: Negative. Disc levels: The central canal and foramina are open at all levels. Shallow central protrusion at T9-10 is noted. MRI LUMBAR SPINE FINDINGS Segmentation:  Standard. Alignment:  Maintained. Vertebrae: The patient is status post vertebral augmentation for a known pathologic L2 compression fracture. Abnormal signal is seen in the posterior elements of L2 consistent with metastatic disease. There is also a large deposit replacing approximately 50% of L3 eccentric to the right. The deposit extends into right pedicle and facets of L3. Additional metastatic deposits are identified in the pedicles and facets of L4, the spinous process of L4, spinous process of L5 and the inferior aspect of L5. T1 hyperintensity from L1 into the sacrum is consistent with prior radiation therapy. There is no new fracture. Conus medullaris: Extends to the  L1 level and appears normal. No epidural tumor is identified. Paraspinal and other soft tissues: Negative. Disc levels: L2: Retropulsed bone off the posterior aspect of L2 causes severe central canal stenosis. The foramina at L1-2 and L2-3 are mildly narrowed. L3-4: There is some facet degenerative disease on right and a shallow disc bulge. The central canal is open. Mild foraminal narrowing is worse on the right. L4-5: There is a shallow disc bulge which is more prominent to the left. Mild narrowing is seen in the left lateral recess. There is moderate left foraminal stenosis. The right foramen is open. L5-S1: A right paracentral disc protrusion mildly indents the thecal sac. The foramina are open. IMPRESSION: THORACIC SPINE: Metastatic deposits in T3 and T9, larger in T3. Negative for fracture. Negative for central canal stenosis or epidural tumor. LUMBAR SPINE:  Multiple metastatic deposits, largest in L3 and L2 where the patient is status post vertebral augmentation for known pathologic fracture. Negative for new fracture or epidural tumor. Bony retropulsion off the posterior aspect of L2 causes severe central canal stenosis. Electronically Signed   By: Inge Rise M.D.   On: 11/27/2022 09:37   MR THORACIC SPINE W WO CONTRAST  Result Date: 11/27/2022 CLINICAL DATA:  History of metastatic adenocarcinoma with a known pathologic fracture of L2 with the patient is status post vertebral augmentation. Staging examination. EXAM: MRI THORACIC AND LUMBAR SPINE WITHOUT AND WITH CONTRAST TECHNIQUE: Multiplanar and multiecho pulse sequences of the thoracic and lumbar spine were obtained without and with intravenous contrast. CONTRAST:  5 mL GADAVIST IV SOLN COMPARISON:  PET CT scan 08/04/2022. FINDINGS: MRI THORACIC SPINE FINDINGS Alignment:  Normal. Vertebrae: There is an enhancing lesion in the right side of the T3 vertebral body measuring 1 cm transverse by 0.9 cm AP by 0.7 cm craniocaudal consistent with a  metastatic deposit. A 0.8 cm in diameter metastatic deposit is identified in the posterior margin of T9 in a right paracentral position. No other evidence of metastatic disease is identified. No fracture. Cord:  Normal signal throughout.  Negative for epidural tumor. Paraspinal and other soft tissues: Negative. Disc levels: The central canal and foramina are open at all levels. Shallow central protrusion at T9-10 is noted. MRI LUMBAR SPINE FINDINGS Segmentation:  Standard. Alignment:  Maintained. Vertebrae: The patient is status post vertebral augmentation for a known pathologic L2 compression fracture. Abnormal signal is seen in the posterior elements of L2 consistent with metastatic disease. There is also a large deposit replacing approximately 50% of L3 eccentric to the right. The deposit extends into right pedicle and facets of L3. Additional metastatic deposits are identified in the pedicles and facets of L4, the spinous process of L4, spinous process of L5 and the inferior aspect of L5. T1 hyperintensity from L1 into the sacrum is consistent with prior radiation therapy. There is no new fracture. Conus medullaris: Extends to the L1 level and appears normal. No epidural tumor is identified. Paraspinal and other soft tissues: Negative. Disc levels: L2: Retropulsed bone off the posterior aspect of L2 causes severe central canal stenosis. The foramina at L1-2 and L2-3 are mildly narrowed. L3-4: There is some facet degenerative disease on right and a shallow disc bulge. The central canal is open. Mild foraminal narrowing is worse on the right. L4-5: There is a shallow disc bulge which is more prominent to the left. Mild narrowing is seen in the left lateral recess. There is moderate left foraminal stenosis. The right foramen is open. L5-S1: A right paracentral disc protrusion mildly indents the thecal sac. The foramina are open. IMPRESSION: THORACIC SPINE: Metastatic deposits in T3 and T9, larger in T3. Negative  for fracture. Negative for central canal stenosis or epidural tumor. LUMBAR SPINE: Multiple metastatic deposits, largest in L3 and L2 where the patient is status post vertebral augmentation for known pathologic fracture. Negative for new fracture or epidural tumor. Bony retropulsion off the posterior aspect of L2 causes severe central canal stenosis. Electronically Signed   By: Inge Rise M.D.   On: 11/27/2022 09:37   CT Chest W Contrast  Result Date: 11/03/2022 CLINICAL DATA:  A 57 year old male presents for evaluation of metastatic non-small cell lung cancer on follow-up. * Tracking Code: BO * EXAM: CT CHEST WITH CONTRAST TECHNIQUE: Multidetector CT imaging of the chest was performed during intravenous contrast administration. RADIATION  DOSE REDUCTION: This exam was performed according to the departmental dose-optimization program which includes automated exposure control, adjustment of the mA and/or kV according to patient size and/or use of iterative reconstruction technique. CONTRAST:  46mL OMNIPAQUE IOHEXOL 300 MG/ML  SOLN COMPARISON:  Comparison is made with June 28, 2022. And PET exam from September of 2023 FINDINGS: Cardiovascular: Heart great vessels with scattered aortic atherosclerosis. RIGHT-sided Port-A-Cath terminating at the caval to atrial junction select caval to atrial junction mid RIGHT atrium, otherwise unremarkable on venous phase. Mediastinum/Nodes: No thoracic inlet, axillary, mediastinal or hilar adenopathy. Esophagus grossly normal. No discrete nodal disease, mild haziness of mediastinal tissue planes may be related to post treatment changes. 1 cm RIGHT hilar lymph node is all that remains of fullness about the RIGHT hilum seen on previous imaging. Lungs/Pleura: Signs of paraseptal and centrilobular pulmonary emphysema mild to moderate greatest at the lung apices. No signs of consolidation. No evidence of pleural effusion. Areas of nodularity about the anterior RIGHT upper lobe  have resolved. Airways are patent. Upper Abdomen: Incidental imaging of upper abdominal contents shows no acute process on limited assessment. No upper abdominal lymphadenopathy. Musculoskeletal: Ununited RIGHT clavicular fracture similar to previous imaging. Healed rib fractures about the bilateral chest also similar to prior imaging. Subtle sclerosis in the T3 vertebral body open parent image 89/6) this is new compared to previous imaging. IMPRESSION: 1. When compared to the August exam there is resolution of areas of nodularity throughout the RIGHT upper lobe medially and resolution of adenopathy seen throughout the chest. When compared to the study of September there is resolution of superimposed extensive ground-glass attenuation and bilateral pleural effusions. 2. Mild fullness about the RIGHT hilum persists, attention on follow-up. Overall findings are compatible with response to therapy. 3. New sclerosis at the T3 vertebral level not seen on previous imaging including prior PET scan. This is suspicious for new site of bony metastasis versus is treated disease developed in the interval. 4. Aortic atherosclerosis. Aortic Atherosclerosis (ICD10-I70.0) and Emphysema (ICD10-J43.9). Electronically Signed   By: Zetta Bills M.D.   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 He has molecular studies by foundation 1 that showed no actionable mutations and PD-L1 expression was 90%. He underwent palliative radiotherapy to the L2 vertebral body compression fraction as well as  solitary brain metastasis with SRS. He is currently undergoing palliative systemic chemotherapy with carboplatin for AUC of 5, Alimta 500 Mg/M2 and Keytruda 200 Mg IV every 3 weeks.  Status post 4 cycles.  Starting from cycle #5 he is on treatment with maintenance Alimta and Keytruda every 3 weeks. I recommended for the patient to proceed with his first cycle of the maintenance Alimta and Keytruda today as planned. His recent imaging studies of the MRI of the brain as well as the thoracolumbar spine showed no evidence for disease progression. I will see him back for follow-up visit in 3 weeks for evaluation before the next cycle of his treatment. For the pain management he is followed by the palliative care team. The patient was advised to call immediately if he has  any other concerning symptoms in the interval. The patient voices understanding of current disease status and treatment options and is in agreement with the current care plan.  All questions were answered. The patient knows to call the clinic with any problems, questions or concerns. We can certainly see the patient much sooner if necessary.  The total time spent in the appointment was 20 minutes.  Disclaimer: This note was dictated with voice recognition software. Similar sounding words can inadvertently be transcribed and may not be corrected upon review.

## 2022-11-28 NOTE — Progress Notes (Signed)
[facility]  [phone]) [phone] [phone]) [phone]   Name: Samuel [name] Date: 11/28/2022 MRN: [id number]  DOB: 13-Oct-1966  Patient Care Team: [place], [name], MD as PCP - General (Family Medicine) Camillia Herter, NP as Nurse Practitioner (Nurse Practitioner) Debera Lat, Carlena Sax, NP as Nurse Practitioner (Nurse Practitioner)    INTERVAL HISTORY: Samuel [name] is a 57 y.o. 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  Palliative ask to see for symptom management and goals of care.   SOCIAL HISTORY:     reports that he quit smoking about 3 months ago. His smoking use included cigarettes. He smoked an average of 1 pack per day. He has never used smokeless tobacco. He reports that he does not currently use alcohol after a past usage of about 50.0 standard drinks of alcohol per week. He reports that he does not use drugs.  ADVANCE DIRECTIVES:  Patient does not have an advanced directives. Education and packet provided. He has expressed interest in completing. States his sister, Samuel [name] would be his designated Scientist, research (medical).   CODE STATUS: Full code  PAST MEDICAL HISTORY: Past Medical History:  Diagnosis Date   Diabetes mellitus without complication ([facility])    Metastatic cancer to brain ([place]) 08/05/2022   Metastatic cancer to spine ([place]) 07/19/2022   Pancreatitis unk   Primary lung adenocarcinoma ([facility]) 08/05/2022    ALLERGIES:  is allergic to penicillins and percocet [oxycodone-acetaminophen].  MEDICATIONS:  Current Outpatient Medications  Medication Sig Dispense Refill   acetaminophen (TYLENOL) 500 MG tablet Take 1,000 mg by mouth every 6 (six) hours as needed for mild pain or moderate pain.     atorvastatin (LIPITOR) 40 MG tablet Take 1 tablet (40 mg total) by mouth daily. 30 tablet 2    blood glucose meter kit and supplies KIT Dispense based on patient and insurance preference. Use up to four times daily as directed. 1 each 0   docusate sodium (COLACE) 100 MG capsule Take 1 capsule (100 mg total) by mouth 2 (two) times daily. 30 capsule 0   Fingerstix Lancets MISC use as directed 295 each 0   folic acid (FOLVITE) 1 MG tablet Take 1 tablet (1 mg total) by mouth daily. 30 tablet 4   gabapentin (NEURONTIN) 300 MG capsule Take 2 capsules (600 mg total) by mouth 3 (three) times daily. 180 capsule 1   glucose blood test strip use as directed 100 each 0   Insulin Lispro Prot & Lispro (HUMALOG 75/25 MIX) (75-25) 100 UNIT/ML Kwikpen Inject 10 units into the skin 2 times daily with a meal 15 mL 5   Insulin Pen Needle 32G X 4 MM MISC Use with Insulin pen 100 each 3   ipratropium-albuterol (DUONEB) 0.5-2.5 (3) MG/3ML SOLN Take 3 mLs by nebulization every 6 (six) hours as needed (Shortness of breath). 360 mL 0   lidocaine-prilocaine (EMLA) cream Apply to the Port-A-Cath site 30-60 minutes before chemo. 30 g 0   morphine (MS CONTIN) 15 MG 12 hr tablet Take 1 tablet (15 mg total) by mouth every 12 (twelve) hours. 60 tablet 0   oxyCODONE (OXY IR/ROXICODONE) 5 MG immediate release tablet Take 1-2 tablets (5-10 mg total) by mouth every 4 (four) hours as needed for severe pain. 120 tablet 0   polyethylene glycol powder (GLYCOLAX/MIRALAX) 17 GM/SCOOP powder Take 1 capful  (  17 g) with water by mouth daily as needed. 238 g 0   prochlorperazine (COMPAZINE) 10 MG tablet Take 1 tablet (10 mg total) by mouth every 6 (six) hours as needed for nausea or vomiting. 30 tablet 0   No current facility-administered medications for this visit.    VITAL SIGNS: There were no vitals taken for this visit. There were no vitals filed for this visit.  Estimated body mass index is 19.58 kg/m as calculated from the following:   Height as of 11/07/22: 5\' 8"  (1.727 m).   Weight as of 11/07/22: 128 lb 12.8 oz (58.4  kg).   PERFORMANCE STATUS (ECOG) : 1 - Symptomatic but completely ambulatory   Physical Exam General: NAD Cardiovascular: regular rate and rhythm Pulmonary:normal breathing pattern  Extremities: no edema, no joint deformities, brace in place  Skin: no rashes Neurological: AAO x3  IMPRESSION: [name] presents to clinic today for follow-up. Overall is much improved. No longer requiring assistive devices to ambulate. Continues to wear brace for support. Shares his appreciation and improvement in activity level and energy.  No acute distress noted.  Denies nausea, vomiting, constipation, diarrhea.  Neoplasm related pain Demosthenes reports pain is well controlled. Tolerating MS Contin and oxycodone for breakthrough pain. He is only taking MS Contin at bedtime given improvement in pain.  Pain tends to be worse later in the evening and during the night.  Requires Oxycodone 1-2 times daily.  Patient pain contract was signed at previous visit on 10/23.  2.  Appetite Appetite continues to be good.  Also drinking protein supplements for additional support.  Current weight 136 pounds up from 128 pounds on 12/18.  He is much appreciative of this.  Shares pleasures of being able to eat foods that he wants enjoyed and snacking.  We will continue to closely monitor. He understands as he continues to do well we will consider further weaning down medications.   PLAN: MS Contin 15 mg at bedtime only  Oxy IR 5-10 mg every 4-6 hours as needed for breakthrough pain Colace twice daily Narcan for emergency use Appetite much improved. Pain contract signed on 10/23. I will plan to see patient back in 3-4 weeks in collaboration with his other oncology appointments.   Patient expressed understanding and was in agreement with this plan. He also understands that He can call the clinic at any time with any questions, concerns, or complaints.     Any controlled substances utilized were prescribed in the  context of palliative care. PDMP has been reviewed.    Time Total: 35 min   Visit consisted of counseling and education dealing with the complex and emotionally intense issues of symptom management and palliative care in the setting of serious and potentially life-threatening illness.Greater than 50%  of this time was spent counseling and coordinating care related to the above assessment and plan.  Alda Lea, AGPCNP-BC  Palliative Medicine Team/[facility]

## 2022-11-28 NOTE — Telephone Encounter (Signed)
No answer

## 2022-11-28 NOTE — Patient Instructions (Signed)
[facility]  Discharge Instructions: Thank you for choosing [facility] to provide your oncology and hematology care.   If you have a lab appointment with the [facility], please go directly to the [facility] and check in at the registration area.   Wear comfortable clothing and clothing appropriate for easy access to any Portacath or PICC line.   We strive to give you quality time with your provider. You may need to reschedule your appointment if you arrive late (15 or more minutes).  Arriving late affects you and other patients whose appointments are after yours.  Also, if you miss three or more appointments without notifying the office, you may be dismissed from the clinic at the provider's discretion.      For prescription refill requests, have your pharmacy contact our office and allow 72 hours for refills to be completed.    Today you received the following chemotherapy and/or immunotherapy agents: Keytruda & Alimta     To help prevent nausea and vomiting after your treatment, we encourage you to take your nausea medication as directed.  BELOW ARE SYMPTOMS THAT SHOULD BE REPORTED IMMEDIATELY: *FEVER GREATER THAN 100.4 F (38 C) OR HIGHER *CHILLS OR SWEATING *NAUSEA AND VOMITING THAT IS NOT CONTROLLED WITH YOUR NAUSEA MEDICATION *UNUSUAL SHORTNESS OF BREATH *UNUSUAL BRUISING OR BLEEDING *URINARY PROBLEMS (pain or burning when urinating, or frequent urination) *BOWEL PROBLEMS (unusual diarrhea, constipation, pain near the anus) TENDERNESS IN MOUTH AND THROAT WITH OR WITHOUT PRESENCE OF ULCERS (sore throat, sores in mouth, or a toothache) UNUSUAL RASH, SWELLING OR PAIN  UNUSUAL VAGINAL DISCHARGE OR ITCHING   Items with * indicate a potential emergency and should be followed up as soon as possible or go to the Emergency Department if any problems should occur.  Please show the CHEMOTHERAPY ALERT CARD or IMMUNOTHERAPY ALERT CARD at  check-in to the Emergency Department and triage nurse.  Should you have questions after your visit or need to cancel or reschedule your appointment, please contact [facility]  Dept: [phone]  and follow the prompts.  Office hours are 8:00 a.m. to 4:30 p.m. Monday - Friday. Please note that voicemails left after 4:00 p.m. may not be returned until the following business day.  We are closed weekends and major holidays. You have access to a nurse at all times for urgent questions. Please call the main number to the clinic Dept: [phone] and follow the prompts.   For any non-urgent questions, you may also contact your provider using MyChart. We now offer e-Visits for anyone 12 and older to request care online for non-urgent symptoms. For details visit [email or web address].   Also download the MyChart app! Go to the app store, search "MyChart", open the app, select [facility], and log in with your MyChart username and password.  Masks are optional in the cancer centers. If you would like for your care team to wear a mask while they are taking care of you, please let them know. You may have one support Arjan Strohm who is at least 59 years old accompany you for your appointments.

## 2022-11-30 ENCOUNTER — Ambulatory Visit
Admission: RE | Admit: 2022-11-30 | Discharge: 2022-11-30 | Disposition: A | Discharge status: Home / Self Care | Payer: Medicaid Other | Source: Ambulatory Visit | Attending: Urology | Admitting: Urology

## 2022-11-30 ENCOUNTER — Encounter: Payer: Self-pay | Admitting: Urology

## 2022-11-30 DIAGNOSIS — C7951 Secondary malignant neoplasm of bone: Secondary | ICD-10-CM

## 2022-11-30 DIAGNOSIS — C7931 Secondary malignant neoplasm of brain: Secondary | ICD-10-CM

## 2022-11-30 NOTE — Progress Notes (Signed)
Telephone nursing interview for patient to receive most recent scan results from 11/25/22.  I verified patient's identity and began nursing interview. Patient is still experiencing some occasional dizziness but is managing and is otherwise doing okay.  Meaningful use complete.  Patient aware of his 11:00am-11/30/2022 telephone appointment w/ Ashlyn Bruning PA-C. I left my extension [phone] in case patient needs anything. Patient verbalized understanding.  This concludes the interview.   Leandra Kern, LPN

## 2022-11-30 NOTE — Progress Notes (Addendum)
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  The patient was notified in advance and gave permission to proceed with this visit format.  He tolerated radiation therapy very well and has appreciated significan improvement in his back pain. He has continued to tolerate the systemic therapy well and completed 4 cycles of the carboplatin/Alimta/Keytruda and recently started his maintenance therapy with Alimta/Keytruda only. He feels like his energy and strength are improving since finishing the chemotherapy.  He had restaging CT Chest scan on 11/03/22 that showed resolution of the lung nodularity in the RUL lung and chest adenopathy. However, there was persistent mild fullness about the right hilum and new sclerosis at the T3 vertebral level, suspicious for a new site of bony metastasis. He had MRI brain scan on 11/25/22 that showed overall improved disease with resolution of the treated left temporal and right occipital lesions and no change in the appearance of the treated punctate lesion in the right cerebellum. No new lesions were identified. MRI of the lumbar and thoracic  spine was performed that same day and this showed stability of the recently treated metastatic disease in the lumbar spine but did confirm new disease in the thoracic spine at T3 and T9.               On review of systems, the patient states that he is doing well in general. He feels like he is getting stronger each day and now has more energy since completing the chemotherapy portion of his treatment.  He does continue with some mild back pain in the mid to lower back but reports that this is well-controlled with pain medication.  He continues using a back brace for support but is looking forward to joining a yoga class in the near future to help with stretching, strength and balance.  He denies any paresthesias or focal weakness in the upper or lower extremities and has not noticed any change in his bowel or bladder habits.  He denies headaches, nausea, vomiting, dizziness, slurred speech, changes in visual or auditory acuity or tremors.  Overall, he is quite pleased with his progress to date.  ALLERGIES:  is allergic to penicillins and percocet [oxycodone-acetaminophen].  Meds: Current Outpatient Medications  Medication Sig Dispense Refill   acetaminophen (TYLENOL) 500 MG tablet Take 1,000 mg by mouth every 6 (six) hours as needed for mild pain or moderate pain.     atorvastatin (LIPITOR) 40 MG tablet Take 1 tablet (40 mg total) by mouth daily. 30 tablet 2   blood glucose meter kit and supplies KIT Dispense based on patient and insurance preference. Use  up to four times daily as directed. 1 each 0   docusate sodium (COLACE) 100 MG capsule Take 1 capsule (100 mg total) by mouth 2 (two) times daily. 30 capsule 0   Fingerstix Lancets MISC use as directed 782 each 0   folic acid (FOLVITE) 1 MG tablet Take 1 tablet (1 mg total) by mouth daily. 30 tablet 4   gabapentin (NEURONTIN) 300 MG capsule Take 2 capsules (600 mg total) by mouth 3 (three) times daily. 180 capsule 1   glucose blood test strip use  as directed 100 each 0   Insulin Lispro Prot & Lispro (HUMALOG 75/25 MIX) (75-25) 100 UNIT/ML Kwikpen Inject 10 units into the skin 2 times daily with a meal 15 mL 5   Insulin Pen Needle 32G X 4 MM MISC Use with Insulin pen 100 each 3   ipratropium-albuterol (DUONEB) 0.5-2.5 (3) MG/3ML SOLN Take 3 mLs by nebulization every 6 (six) hours as needed (Shortness of breath). 360 mL 0   lidocaine-prilocaine (EMLA) cream Apply to the Port-A-Cath site 30-60 minutes before chemo. 30 g 0   morphine (MS CONTIN) 15 MG 12 hr tablet Take 1 tablet (15 mg total) by mouth every 12 (twelve) hours. 60 tablet 0   oxyCODONE (OXY IR/ROXICODONE) 5 MG immediate release tablet Take 1-2 tablets (5-10 mg total) by mouth every 4 (four) hours as needed for severe pain. 120 tablet 0   polyethylene glycol powder (GLYCOLAX/MIRALAX) 17 GM/SCOOP powder Take 1 capful  (17 g) with water by mouth daily as needed. 238 g 0   prochlorperazine (COMPAZINE) 10 MG tablet Take 1 tablet (10 mg total) by mouth every 6 (six) hours as needed for nausea or vomiting. 30 tablet 0   No current facility-administered medications for this encounter.    Physical Findings:  vitals were not taken for this visit.  Pain Assessment Pain Score: 0-No pain/10 Unable to assess due to telephone follow-up visit format.  Lab Findings: Lab Results  Component Value Date   WBC 5.3 11/28/2022   HGB 11.9 (L) 11/28/2022   HCT 33.8 (L) 11/28/2022   MCV 95.8 11/28/2022   PLT 145 (L) 11/28/2022     Radiographic Findings: MR Brain W Wo Contrast  Result Date: 11/28/2022 CLINICAL DATA:  Brain metastases, assess treatment response 3T SRS Protocol EXAM: MRI HEAD WITHOUT AND WITH CONTRAST TECHNIQUE: Multiplanar, multiecho pulse sequences of the brain and surrounding structures were obtained without and with intravenous contrast. CONTRAST:  55mL GADAVIST GADOBUTROL 1 MMOL/ML IV SOLN COMPARISON:  MRI 08/05/2012. FINDINGS: Brain: No evidence of acute infarct, acute  hemorrhage, midline shift, hydrocephalus or extra-axial fluid collection. Punctate right cerebellar metastasis is not substantially changed (series 2600, image 120). Left temporal and right occipital metastases are no longer seen. No new enhancing lesions identified. Vascular: Major arterial flow voids are maintained at the skull base. Skull and upper cervical spine: Normal marrow signal. Sinuses/Orbits: Largely clear sinuses.  No acute orbital findings. Other: No mastoid effusions. IMPRESSION: Punctate right cerebellar metastasis is not substantially changed. Left temporal and right occipital metastases are no longer seen. No new enhancing lesions identified. Electronically Signed   By: Margaretha Sheffield M.D.   On: 11/28/2022 08:47   MR Lumbar Spine W Wo Contrast  Result Date: 11/27/2022 CLINICAL DATA:  History of metastatic adenocarcinoma with a known pathologic fracture of L2 with the patient is status post vertebral augmentation. Staging examination. EXAM: MRI THORACIC AND LUMBAR SPINE WITHOUT AND WITH CONTRAST TECHNIQUE: Multiplanar and multiecho pulse sequences  of the thoracic and lumbar spine were obtained without and with intravenous contrast. CONTRAST:  5 mL GADAVIST IV SOLN COMPARISON:  PET CT scan 08/04/2022. FINDINGS: MRI THORACIC SPINE FINDINGS Alignment:  Normal. Vertebrae: There is an enhancing lesion in the right side of the T3 vertebral body measuring 1 cm transverse by 0.9 cm AP by 0.7 cm craniocaudal consistent with a metastatic deposit. A 0.8 cm in diameter metastatic deposit is identified in the posterior margin of T9 in a right paracentral position. No other evidence of metastatic disease is identified. No fracture. Cord:  Normal signal throughout.  Negative for epidural tumor. Paraspinal and other soft tissues: Negative. Disc levels: The central canal and foramina are open at all levels. Shallow central protrusion at T9-10 is noted. MRI LUMBAR SPINE FINDINGS Segmentation:  Standard.  Alignment:  Maintained. Vertebrae: The patient is status post vertebral augmentation for a known pathologic L2 compression fracture. Abnormal signal is seen in the posterior elements of L2 consistent with metastatic disease. There is also a large deposit replacing approximately 50% of L3 eccentric to the right. The deposit extends into right pedicle and facets of L3. Additional metastatic deposits are identified in the pedicles and facets of L4, the spinous process of L4, spinous process of L5 and the inferior aspect of L5. T1 hyperintensity from L1 into the sacrum is consistent with prior radiation therapy. There is no new fracture. Conus medullaris: Extends to the L1 level and appears normal. No epidural tumor is identified. Paraspinal and other soft tissues: Negative. Disc levels: L2: Retropulsed bone off the posterior aspect of L2 causes severe central canal stenosis. The foramina at L1-2 and L2-3 are mildly narrowed. L3-4: There is some facet degenerative disease on right and a shallow disc bulge. The central canal is open. Mild foraminal narrowing is worse on the right. L4-5: There is a shallow disc bulge which is more prominent to the left. Mild narrowing is seen in the left lateral recess. There is moderate left foraminal stenosis. The right foramen is open. L5-S1: A right paracentral disc protrusion mildly indents the thecal sac. The foramina are open. IMPRESSION: THORACIC SPINE: Metastatic deposits in T3 and T9, larger in T3. Negative for fracture. Negative for central canal stenosis or epidural tumor. LUMBAR SPINE: Multiple metastatic deposits, largest in L3 and L2 where the patient is status post vertebral augmentation for known pathologic fracture. Negative for new fracture or epidural tumor. Bony retropulsion off the posterior aspect of L2 causes severe central canal stenosis. Electronically Signed   By: Inge Rise M.D.   On: 11/27/2022 09:37   MR THORACIC SPINE W WO CONTRAST  Result Date:  11/27/2022 CLINICAL DATA:  History of metastatic adenocarcinoma with a known pathologic fracture of L2 with the patient is status post vertebral augmentation. Staging examination. EXAM: MRI THORACIC AND LUMBAR SPINE WITHOUT AND WITH CONTRAST TECHNIQUE: Multiplanar and multiecho pulse sequences of the thoracic and lumbar spine were obtained without and with intravenous contrast. CONTRAST:  5 mL GADAVIST IV SOLN COMPARISON:  PET CT scan 08/04/2022. FINDINGS: MRI THORACIC SPINE FINDINGS Alignment:  Normal. Vertebrae: There is an enhancing lesion in the right side of the T3 vertebral body measuring 1 cm transverse by 0.9 cm AP by 0.7 cm craniocaudal consistent with a metastatic deposit. A 0.8 cm in diameter metastatic deposit is identified in the posterior margin of T9 in a right paracentral position. No other evidence of metastatic disease is identified. No fracture. Cord:  Normal signal throughout.  Negative for epidural  tumor. Paraspinal and other soft tissues: Negative. Disc levels: The central canal and foramina are open at all levels. Shallow central protrusion at T9-10 is noted. MRI LUMBAR SPINE FINDINGS Segmentation:  Standard. Alignment:  Maintained. Vertebrae: The patient is status post vertebral augmentation for a known pathologic L2 compression fracture. Abnormal signal is seen in the posterior elements of L2 consistent with metastatic disease. There is also a large deposit replacing approximately 50% of L3 eccentric to the right. The deposit extends into right pedicle and facets of L3. Additional metastatic deposits are identified in the pedicles and facets of L4, the spinous process of L4, spinous process of L5 and the inferior aspect of L5. T1 hyperintensity from L1 into the sacrum is consistent with prior radiation therapy. There is no new fracture. Conus medullaris: Extends to the L1 level and appears normal. No epidural tumor is identified. Paraspinal and other soft tissues: Negative. Disc levels: L2:  Retropulsed bone off the posterior aspect of L2 causes severe central canal stenosis. The foramina at L1-2 and L2-3 are mildly narrowed. L3-4: There is some facet degenerative disease on right and a shallow disc bulge. The central canal is open. Mild foraminal narrowing is worse on the right. L4-5: There is a shallow disc bulge which is more prominent to the left. Mild narrowing is seen in the left lateral recess. There is moderate left foraminal stenosis. The right foramen is open. L5-S1: A right paracentral disc protrusion mildly indents the thecal sac. The foramina are open. IMPRESSION: THORACIC SPINE: Metastatic deposits in T3 and T9, larger in T3. Negative for fracture. Negative for central canal stenosis or epidural tumor. LUMBAR SPINE: Multiple metastatic deposits, largest in L3 and L2 where the patient is status post vertebral augmentation for known pathologic fracture. Negative for new fracture or epidural tumor. Bony retropulsion off the posterior aspect of L2 causes severe central canal stenosis. Electronically Signed   By: Inge Rise M.D.   On: 11/27/2022 09:37   CT Chest W Contrast  Result Date: 11/03/2022 CLINICAL DATA:  A 57 year old male presents for evaluation of metastatic non-small cell lung cancer on follow-up. * Tracking Code: BO * EXAM: CT CHEST WITH CONTRAST TECHNIQUE: Multidetector CT imaging of the chest was performed during intravenous contrast administration. RADIATION DOSE REDUCTION: This exam was performed according to the departmental dose-optimization program which includes automated exposure control, adjustment of the mA and/or kV according to patient size and/or use of iterative reconstruction technique. CONTRAST:  53mL OMNIPAQUE IOHEXOL 300 MG/ML  SOLN COMPARISON:  Comparison is made with June 28, 2022. And PET exam from September of 2023 FINDINGS: Cardiovascular: Heart great vessels with scattered aortic atherosclerosis. RIGHT-sided Port-A-Cath terminating at the caval  to atrial junction select caval to atrial junction mid RIGHT atrium, otherwise unremarkable on venous phase. Mediastinum/Nodes: No thoracic inlet, axillary, mediastinal or hilar adenopathy. Esophagus grossly normal. No discrete nodal disease, mild haziness of mediastinal tissue planes may be related to post treatment changes. 1 cm RIGHT hilar lymph node is all that remains of fullness about the RIGHT hilum seen on previous imaging. Lungs/Pleura: Signs of paraseptal and centrilobular pulmonary emphysema mild to moderate greatest at the lung apices. No signs of consolidation. No evidence of pleural effusion. Areas of nodularity about the anterior RIGHT upper lobe have resolved. Airways are patent. Upper Abdomen: Incidental imaging of upper abdominal contents shows no acute process on limited assessment. No upper abdominal lymphadenopathy. Musculoskeletal: Ununited RIGHT clavicular fracture similar to previous imaging. Healed rib fractures about the bilateral  chest also similar to prior imaging. Subtle sclerosis in the T3 vertebral body open parent image 89/6) this is new compared to previous imaging. IMPRESSION: 1. When compared to the August exam there is resolution of areas of nodularity throughout the RIGHT upper lobe medially and resolution of adenopathy seen throughout the chest. When compared to the study of September there is resolution of superimposed extensive ground-glass attenuation and bilateral pleural effusions. 2. Mild fullness about the RIGHT hilum persists, attention on follow-up. Overall findings are compatible with response to therapy. 3. New sclerosis at the T3 vertebral level not seen on previous imaging including prior PET scan. This is suspicious for new site of bony metastasis versus is treated disease developed in the interval. 4. Aortic atherosclerosis. Aortic Atherosclerosis (ICD10-I70.0) and Emphysema (ICD10-J43.9). Electronically Signed   By: Zetta Bills M.D.   On: 11/03/2022 15:32     Impression/Plan: 1. 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  He appears to have recovered well from the effects of his recent radiotherapy and is currently without complaints.  His brain disease appears well controlled on recent MRI brain scan. He continues with mild back pain that is well-controlled with pain medications as needed.  We did review the results of his recent MRI scans of the thoracic and lumbar spine which do show evidence of disease progression with 2 new metastatic lesions in the thoracic spine at T3 and T9.  Fortunately, these are small and do not appear to be causing him any significant discomfort.  We did discuss the potential role for further palliative radiotherapy to these areas in the future should they become more bothersome or no longer responding to the pain medication.  At this point, he feels like his pain is well-controlled and he would like to forego any further radiation at present.  I will share our discussion with Dr. Earlie Server to keep him in the loop and we will plan to continue to monitor the brain and spine with repeat MRI scans in 3 months and a telephone follow-up visit thereafter to review results and recommendations from the multidisciplinary brain and spine conference.  He knows that he is to call and/or seek evaluation immediately should he develop any uncontrolled pain or neurologic dysfunction as discussed today.  He is comfortable and in agreement with the stated plan.   I personally spent 20 minutes in this encounter including chart review, reviewing radiological studies, telephone conversation with the patient, entering orders and completing documentation.    Nicholos Johns, PA-C

## 2022-12-05 ENCOUNTER — Inpatient Hospital Stay: Payer: Medicaid Other

## 2022-12-05 ENCOUNTER — Other Ambulatory Visit: Payer: Self-pay

## 2022-12-05 ENCOUNTER — Other Ambulatory Visit: Payer: Medicaid Other

## 2022-12-05 DIAGNOSIS — C3490 Malignant neoplasm of unspecified part of unspecified bronchus or lung: Secondary | ICD-10-CM

## 2022-12-05 DIAGNOSIS — Z5112 Encounter for antineoplastic immunotherapy: Secondary | ICD-10-CM | POA: Diagnosis not present

## 2022-12-05 DIAGNOSIS — C7931 Secondary malignant neoplasm of brain: Secondary | ICD-10-CM

## 2022-12-05 LAB — CBC WITH DIFFERENTIAL (CANCER CENTER ONLY)
Abs Immature Granulocytes: 0.02 10*3/uL (ref 0.00–0.07)
Basophils Absolute: 0 10*3/uL (ref 0.0–0.1)
Basophils Relative: 0 %
Eosinophils Absolute: 0 10*3/uL (ref 0.0–0.5)
Eosinophils Relative: 0 %
HCT: 32.6 % — ABNORMAL LOW (ref 39.0–52.0)
Hemoglobin: 11.4 g/dL — ABNORMAL LOW (ref 13.0–17.0)
Immature Granulocytes: 1 %
Lymphocytes Relative: 28 %
Lymphs Abs: 0.9 10*3/uL (ref 0.7–4.0)
MCH: 33.6 pg (ref 26.0–34.0)
MCHC: 35 g/dL (ref 30.0–36.0)
MCV: 96.2 fL (ref 80.0–100.0)
Monocytes Absolute: 0.3 10*3/uL (ref 0.1–1.0)
Monocytes Relative: 10 %
Neutro Abs: 1.9 10*3/uL (ref 1.7–7.7)
Neutrophils Relative %: 61 %
Platelet Count: 102 10*3/uL — ABNORMAL LOW (ref 150–400)
RBC: 3.39 MIL/uL — ABNORMAL LOW (ref 4.22–5.81)
RDW: 13.3 % (ref 11.5–15.5)
WBC Count: 3.2 10*3/uL — ABNORMAL LOW (ref 4.0–10.5)
nRBC: 0 % (ref 0.0–0.2)

## 2022-12-05 LAB — CMP (CANCER CENTER ONLY)
ALT: 42 U/L (ref 0–44)
AST: 24 U/L (ref 15–41)
Albumin: 3.5 g/dL (ref 3.5–5.0)
Alkaline Phosphatase: 85 U/L (ref 38–126)
Anion gap: 5 (ref 5–15)
BUN: 10 mg/dL (ref 6–20)
CO2: 29 mmol/L (ref 22–32)
Calcium: 9.3 mg/dL (ref 8.9–10.3)
Chloride: 100 mmol/L (ref 98–111)
Creatinine: 0.43 mg/dL — ABNORMAL LOW (ref 0.61–1.24)
GFR, Estimated: >60 mL/min (ref >60)
Glucose, Bld: 339 mg/dL — ABNORMAL HIGH (ref 70–99)
Potassium: 3.8 mmol/L (ref 3.5–5.1)
Sodium: 134 mmol/L — ABNORMAL LOW (ref 135–145)
Total Bilirubin: 0.3 mg/dL (ref 0.3–1.2)
Total Protein: 6.8 g/dL (ref 6.5–8.1)

## 2022-12-05 MED ORDER — SODIUM CHLORIDE 0.9% FLUSH
10.0000 mL | Freq: Once | INTRAVENOUS | Status: AC
  Administered 2022-12-05: 10 mL

## 2022-12-05 MED ORDER — HEPARIN SOD (PORK) LOCK FLUSH 100 UNIT/ML IV SOLN
500.0000 [IU] | Freq: Once | INTRAVENOUS | Status: AC
  Administered 2022-12-05: 500 [IU]

## 2022-12-12 ENCOUNTER — Inpatient Hospital Stay: Payer: Medicaid Other

## 2022-12-12 ENCOUNTER — Telehealth: Payer: Self-pay

## 2022-12-12 ENCOUNTER — Encounter: Payer: Self-pay | Admitting: Nurse Practitioner

## 2022-12-12 ENCOUNTER — Encounter: Payer: Self-pay | Admitting: Internal Medicine

## 2022-12-12 ENCOUNTER — Other Ambulatory Visit (HOSPITAL_COMMUNITY): Payer: Self-pay

## 2022-12-12 ENCOUNTER — Inpatient Hospital Stay (HOSPITAL_BASED_OUTPATIENT_CLINIC_OR_DEPARTMENT_OTHER): Payer: Medicaid Other | Admitting: Nurse Practitioner

## 2022-12-12 DIAGNOSIS — G893 Neoplasm related pain (acute) (chronic): Secondary | ICD-10-CM | POA: Diagnosis not present

## 2022-12-12 DIAGNOSIS — M792 Neuralgia and neuritis, unspecified: Secondary | ICD-10-CM | POA: Diagnosis not present

## 2022-12-12 DIAGNOSIS — Z515 Encounter for palliative care: Secondary | ICD-10-CM

## 2022-12-12 DIAGNOSIS — C3490 Malignant neoplasm of unspecified part of unspecified bronchus or lung: Secondary | ICD-10-CM

## 2022-12-12 DIAGNOSIS — C7951 Secondary malignant neoplasm of bone: Secondary | ICD-10-CM | POA: Diagnosis not present

## 2022-12-12 DIAGNOSIS — Z5112 Encounter for antineoplastic immunotherapy: Secondary | ICD-10-CM | POA: Diagnosis not present

## 2022-12-12 DIAGNOSIS — C7931 Secondary malignant neoplasm of brain: Secondary | ICD-10-CM

## 2022-12-12 LAB — CBC WITH DIFFERENTIAL (CANCER CENTER ONLY)
Abs Immature Granulocytes: 0.02 10*3/uL (ref 0.00–0.07)
Basophils Absolute: 0 10*3/uL (ref 0.0–0.1)
Basophils Relative: 0 %
Eosinophils Absolute: 0 10*3/uL (ref 0.0–0.5)
Eosinophils Relative: 1 %
HCT: 32.7 % — ABNORMAL LOW (ref 39.0–52.0)
Hemoglobin: 11.4 g/dL — ABNORMAL LOW (ref 13.0–17.0)
Immature Granulocytes: 0 %
Lymphocytes Relative: 18 %
Lymphs Abs: 0.9 10*3/uL (ref 0.7–4.0)
MCH: 33.9 pg (ref 26.0–34.0)
MCHC: 34.9 g/dL (ref 30.0–36.0)
MCV: 97.3 fL (ref 80.0–100.0)
Monocytes Absolute: 0.5 10*3/uL (ref 0.1–1.0)
Monocytes Relative: 9 %
Neutro Abs: 3.7 10*3/uL (ref 1.7–7.7)
Neutrophils Relative %: 72 %
Platelet Count: 162 10*3/uL (ref 150–400)
RBC: 3.36 MIL/uL — ABNORMAL LOW (ref 4.22–5.81)
RDW: 13.5 % (ref 11.5–15.5)
WBC Count: 5.1 10*3/uL (ref 4.0–10.5)
nRBC: 0 % (ref 0.0–0.2)

## 2022-12-12 LAB — CMP (CANCER CENTER ONLY)
ALT: 32 U/L (ref 0–44)
AST: 20 U/L (ref 15–41)
Albumin: 3.5 g/dL (ref 3.5–5.0)
Alkaline Phosphatase: 92 U/L (ref 38–126)
Anion gap: 4 — ABNORMAL LOW (ref 5–15)
BUN: 9 mg/dL (ref 6–20)
CO2: 28 mmol/L (ref 22–32)
Calcium: 9 mg/dL (ref 8.9–10.3)
Chloride: 102 mmol/L (ref 98–111)
Creatinine: 0.56 mg/dL — ABNORMAL LOW (ref 0.61–1.24)
GFR, Estimated: >60 mL/min (ref >60)
Glucose, Bld: 508 mg/dL (ref 70–99)
Potassium: 3.8 mmol/L (ref 3.5–5.1)
Sodium: 134 mmol/L — ABNORMAL LOW (ref 135–145)
Total Bilirubin: 0.3 mg/dL (ref 0.3–1.2)
Total Protein: 6.2 g/dL — ABNORMAL LOW (ref 6.5–8.1)

## 2022-12-12 MED ORDER — OXYCODONE HCL 5 MG PO TABS
5.0000 mg | ORAL_TABLET | ORAL | 0 refills | Status: DC | PRN
  Filled 2022-12-12: qty 120, 10d supply, fill #0

## 2022-12-12 MED ORDER — MORPHINE SULFATE ER 15 MG PO TBCR
15.0000 mg | EXTENDED_RELEASE_TABLET | Freq: Two times a day (BID) | ORAL | 0 refills | Status: DC
  Filled 2022-12-12: qty 60, 30d supply, fill #0

## 2022-12-12 MED ORDER — HEPARIN SOD (PORK) LOCK FLUSH 100 UNIT/ML IV SOLN
500.0000 [IU] | Freq: Once | INTRAVENOUS | Status: AC
  Administered 2022-12-12: 500 [IU]

## 2022-12-12 MED ORDER — SODIUM CHLORIDE 0.9% FLUSH
10.0000 mL | Freq: Once | INTRAVENOUS | Status: AC
  Administered 2022-12-12: 10 mL

## 2022-12-12 NOTE — Telephone Encounter (Signed)
CRITICAL VALUE STICKER  CRITICAL VALUE: Glucose-508  RECEIVER (on-site recipient of call): Bridgett Larsson LPN  DATE & TIME NOTIFIED: 12/12/2022, 1405  MESSENGER (representative from lab): Tori Milks  MD NOTIFIED: Si Gaul   TIME OF NOTIFICATION: 1409  RESPONSE:  If patient is still here to give regular insulin, and if he is not here to give him a call to follow up with his PCP regarding glucose levels.

## 2022-12-12 NOTE — Progress Notes (Signed)
Palliative Medicine [facility]  [phone] [phone]   Name: Samuel [name] Date: 12/12/2022 MRN: [id number]  DOB: 1966/02/24  Patient Care Team: [place], [name], MD as PCP - General (Family Medicine) Rema Fendt, NP as Nurse Practitioner (Nurse Practitioner) Valerie Roys, Arty Baumgartner, NP as Nurse Practitioner (Nurse Practitioner)    INTERVAL HISTORY: Samuel [name] is a 57 y.o. 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  Palliative ask to see for symptom management and goals of care.   SOCIAL HISTORY:     reports that he quit smoking about 3 months ago. His smoking use included cigarettes. He smoked an average of 1 pack per day. He has never used smokeless tobacco. He reports that he does not currently use alcohol after a past usage of about 50.0 standard drinks of alcohol per week. He reports that he does not use drugs.  ADVANCE DIRECTIVES:  Patient does not have an advanced directives. Education and packet provided. He has expressed interest in completing. States his sister, Seichi Kaufhold would be his designated Clinical research associate.   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  MEDICATIONS:  Current Outpatient Medications  Medication Sig Dispense Refill   acetaminophen (TYLENOL) 500 MG tablet Take 1,000 mg by mouth every 6 (six) hours as needed for mild pain or moderate pain.     atorvastatin (LIPITOR) 40 MG tablet Take 1 tablet (40 mg total) by mouth daily. 30 tablet 2    blood glucose meter kit and supplies KIT Dispense based on patient and insurance preference. Use up to four times daily as directed. 1 each 0   docusate sodium (COLACE) 100 MG capsule Take 1 capsule (100 mg total) by mouth 2 (two) times daily. 30 capsule 0   Fingerstix Lancets MISC use as directed 100 each 0   folic acid (FOLVITE) 1 MG tablet Take 1 tablet (1 mg total) by mouth daily. 30 tablet 4   gabapentin (NEURONTIN) 300 MG capsule Take 2 capsules (600 mg total) by mouth 3 (three) times daily. 180 capsule 1   glucose blood test strip use as directed 100 each 0   Insulin Lispro Prot & Lispro (HUMALOG 75/25 MIX) (75-25) 100 UNIT/ML Kwikpen Inject 10 units into the skin 2 times daily with a meal 15 mL 5   Insulin Pen Needle 32G X 4 MM MISC Use with Insulin pen 100 each 3   ipratropium-albuterol (DUONEB) 0.5-2.5 (3) MG/3ML SOLN Take 3 mLs by nebulization every 6 (six) hours as needed (Shortness of breath). 360 mL 0   lidocaine-prilocaine (EMLA) cream Apply to the Port-A-Cath site 30-60 minutes before chemo. 30 g 0   morphine (MS CONTIN) 15 MG 12 hr tablet Take 1 tablet (15 mg total) by mouth every 12 (twelve) hours. 60 tablet 0   oxyCODONE (OXY IR/ROXICODONE) 5 MG immediate release tablet Take 1-2 tablets (5-10 mg total) by mouth every 4 (four) hours as needed for severe pain. 120 tablet 0   polyethylene glycol powder (GLYCOLAX/MIRALAX) 17 GM/SCOOP powder Take 1 capful  (  17 g) with water by mouth daily as needed. 238 g 0   prochlorperazine (COMPAZINE) 10 MG tablet Take 1 tablet (10 mg total) by mouth every 6 (six) hours as needed for nausea or vomiting. 30 tablet 0   No current facility-administered medications for this visit.    VITAL SIGNS: There were no vitals taken for this visit. There were no vitals filed for this visit.  Estimated body mass index is 20.68 kg/m as calculated from the following:   Height as of 11/07/22: 5\' 8"  (1.727 m).   Weight as of 11/28/22: 136 lb (61.7  kg).   PERFORMANCE STATUS (ECOG) : 1 - Symptomatic but completely ambulatory   Physical Exam General: NAD Cardiovascular: regular rate and rhythm Pulmonary:normal breathing pattern  Extremities: no edema, no joint deformities, brace in place  Skin: no rashes Neurological: AAO x3  IMPRESSION: Mr. Stetson presents to clinic today for follow-up. Continues to do well. He is appreciative of this. Appetite continues to be good. He is ambulating without assistive devices. Wearing back brace without difficulty. Activity improved.   Neoplasm related pain Josephine reports pain is well controlled. Tolerating MS Contin and oxycodone for breakthrough pain. He is only taking MS Contin at bedtime given improvement in pain.  Pain tends to be worse later in the evening and during the night. Not requiring breakthrough medication around the clock. He is appropriately taking medications and refills in line with administration schedule.   Patient pain contract was signed at previous visit on 10/23.  2.  Appetite Much improved. Weight stable at 136lbs which is an increase from 128lbs on 12/18. We discussed nutrition and food options in the setting of diabetes.   PLAN: MS Contin 15 mg at bedtime only  Oxy IR 5-10 mg every 4-6 hours as needed for breakthrough pain Colace twice daily Narcan for emergency use Appetite much improved.Weight is stable.  Pain contract signed on 10/23. I will plan to see patient back in 3-4 weeks in collaboration with his other oncology appointments.   Patient expressed understanding and was in agreement with this plan. He also understands that He can call the clinic at any time with any questions, concerns, or complaints.        Any controlled substances utilized were prescribed in the context of palliative care. PDMP has been reviewed.    Time Total: 25 min   Visit consisted of counseling and education dealing with the complex and emotionally intense issues of symptom  management and palliative care in the setting of serious and potentially life-threatening illness.Greater than 50%  of this time was spent counseling and coordinating care related to the above assessment and plan.  11/23, AGPCNP-BC  Palliative Medicine Team/[facility]

## 2022-12-12 NOTE — Telephone Encounter (Signed)
Left patient a voicemail regarding his glucose level at 508. Per Dr. Arbutus Ped, he needs to follow up with PCP about his glucose levels. Left office # [phone] if patient has any questions or concerns.

## 2022-12-13 ENCOUNTER — Other Ambulatory Visit (HOSPITAL_COMMUNITY): Payer: Self-pay

## 2022-12-13 ENCOUNTER — Encounter: Payer: Self-pay | Admitting: Internal Medicine

## 2022-12-16 ENCOUNTER — Other Ambulatory Visit: Payer: Self-pay | Admitting: Radiation Therapy

## 2022-12-16 DIAGNOSIS — C7931 Secondary malignant neoplasm of brain: Secondary | ICD-10-CM

## 2022-12-17 NOTE — Progress Notes (Signed)
[facility] OFFICE PROGRESS NOTE  [place], [name], MD [phone] 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    Biomarker Findings Microsatellite status - MS-Stable Tumor Mutational Burden - 8 Muts/Mb Genomic Findings For a complete list of the genes assayed, please refer to the Appendix. KEAP1 Q227* CDKN2A/B [id number] G89V PRKCI amplification TERC amplification - equivocal? TP53 [id number]*18 8 Disease relevant genes with no reportable alterations: ALK, BRAF, EGFR, ERBB2, KRAS, MET, RET, ROS1   PDL1 TPS  90%  PRIOR THERAPY: 1) Status post palliative radiotherapy to the L2 lesion under the care of Dr. Kathrynn Running. 2) Status post SRS to solitary brain metastasis under the care of Dr. Kathrynn Running  CURRENT THERAPY: Palliative systemic therapy with carboplatin for AUC of 5, Alimta 500 Mg/M2 and Keytruda 200 Mg IV every 3 weeks.  First dose September 05, 2022.  Status post 5 cycles.   INTERVAL HISTORY: Samuel [name] 57 y.o. male returns to the clinic today for a follow-up visit. The patient was diagnosed with lung cancer in September 2023.  He is currently undergoing palliative systemic maintenance chemotherapy and immunotherapy.  The patient appreciates that he feels a lot better than when he was initially diagnosed.  He received palliative radiation to the metastatic bone lesions in the spine and recently had a follow-up MRI to assess this.   It looks like he had some disease progression with 2 new metastatic lesions at T3 and T9.  Fortunately these are small and not causing any pain.  Since they are not bothersome to  the patient, they will continue to monitor for now with a repeat MRI in 3 months.   He also follows with palliative care and his pain is controlled at this time.  He is happy that his mobility has improved and he is able to walk with a walker for ambulation versus having to get around in a wheelchair.  His appetite is improving and he is gaining weight.  However because his appetite is improving he has had some uncontrolled diabetes.  The patient has had diabetes for many years.  He is not always compliant with his diabetic medication.  He does state that he is taking his insulin at this time.   Otherwise he denies any fever, chills, or night sweats.  Denies any chest pain, shortness of breath, cough, or hemoptysis.  Denies any nausea, vomiting, diarrhea, or constipation.  Denies any headache or visual changes. Denies any rashes or skin changes.  He states sometimes in the morning when he wakes up due to positioning he may have some numbness and tingling in his left hip that improves once he stands up and ambulates.  The patient is excited because he is going to start water yoga on Saturday.  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  MEDICATIONS:  Current Outpatient Medications  Medication Sig Dispense Refill   insulin detemir (LEVEMIR FLEXPEN) 100 UNIT/ML FlexPen Inject into the skin.     acetaminophen (TYLENOL) 500 MG tablet Take 1,000 mg by mouth every 6 (six) hours as needed for mild pain or moderate pain.     atorvastatin (LIPITOR) 40 MG tablet Take 1 tablet (40 mg total) by mouth daily. (Patient not taking: Reported  on 12/19/2022) 30 tablet 2   blood glucose meter kit and supplies KIT Dispense based on patient and insurance preference. Use up to four times daily as directed. 1 each 0   Fingerstix Lancets MISC use as directed 100 each 0   folic acid (FOLVITE) 1 MG tablet Take 1 tablet (1 mg total) by mouth daily. 30 tablet 4   gabapentin (NEURONTIN) 300 MG capsule Take 2 capsules (600 mg total) by mouth 3 (three) times daily. 180 capsule 1   glucose blood test strip use as directed 100 each 0   Insulin Lispro Prot & Lispro (HUMALOG 75/25 MIX) (75-25) 100 UNIT/ML Kwikpen Inject 10 units into the skin 2 times daily with a meal 15 mL 5   Insulin Pen Needle 32G X 4 MM MISC Use with Insulin pen 100 each 3   ipratropium-albuterol (DUONEB) 0.5-2.5 (3) MG/3ML SOLN Take 3 mLs by nebulization every 6 (six) hours as needed (Shortness of breath). 360 mL 0   lidocaine-prilocaine (EMLA) cream Apply to the Port-A-Cath site 30-60 minutes before chemo. 30 g 0   morphine (MS CONTIN) 15 MG 12 hr tablet Take 1 tablet (15 mg total) by mouth every 12 (twelve) hours. 60 tablet 0   oxyCODONE (OXY IR/ROXICODONE) 5 MG immediate release tablet Take 1 - 2 tablets (5 - 10 mg total) by mouth every 4 hours as needed for severe pain. 120 tablet 0   prochlorperazine (COMPAZINE) 10 MG tablet Take 1 tablet (10 mg total) by mouth every 6 (six) hours as needed for nausea or vomiting. (Patient not taking: Reported on 12/19/2022) 30 tablet 0   No current facility-administered medications for this visit.    SURGICAL HISTORY:  Past Surgical History:  Procedure Laterality Date   IR BONE TUMOR(S)RF ABLATION  08/01/2022   IR IMAGING GUIDED PORT INSERTION  09/21/2022   IR KYPHO LUMBAR INC FX REDUCE BONE BX UNI/BIL CANNULATION INC/IMAGING  08/01/2022    REVIEW OF SYSTEMS:   Review of Systems  Constitutional: Negative for appetite change, chills, fatigue, fever and unexpected weight change.  HENT: Negative for mouth sores, nosebleeds, sore throat and  trouble swallowing.   Eyes: Negative for eye problems and icterus.  Respiratory: Negative for cough, hemoptysis, shortness of breath and wheezing.   Cardiovascular: Negative for chest pain and leg swelling.  Gastrointestinal: Negative for abdominal pain, constipation, diarrhea, nausea and vomiting.  Genitourinary: Negative for bladder incontinence, difficulty urinating, dysuria, frequency and hematuria.   Musculoskeletal: Positive for improved back pain.  Negative for gait problem, neck pain and neck stiffness.  Skin: Negative for itching and rash.  Neurological: Negative for dizziness, extremity weakness, gait problem, headaches, light-headedness and seizures.  Hematological: Negative for adenopathy. Does not bruise/bleed easily.  Psychiatric/Behavioral: Negative for confusion, depression and sleep disturbance. The patient is not nervous/anxious.     PHYSICAL EXAMINATION:  Blood pressure 124/70, pulse 88, temperature 98.7 F (37.1 C), temperature source Oral, resp. rate 16, weight 137 lb 3.2 oz (62.2 kg), SpO2 100 %.  ECOG PERFORMANCE STATUS: 1  Physical Exam  Constitutional: Oriented to person,  place, and time and thin appearing male, and in no distress.  HENT:  Head: Normocephalic and atraumatic.  Mouth/Throat: Oropharynx is clear and moist. No oropharyngeal exudate.  Eyes: Conjunctivae are normal. Right eye exhibits no discharge. Left eye exhibits no discharge. No scleral icterus.  Neck: Normal range of motion. Neck supple.  Cardiovascular: Normal rate, regular rhythm, normal heart sounds and intact distal pulses.   Pulmonary/Chest: Effort normal. Quiet breath sounds bilaterally. No respiratory distress. No wheezes. No rales.  Abdominal: Soft. Bowel sounds are normal. Exhibits no distension and no mass. There is no tenderness.  Musculoskeletal: back brace. Normal range of motion. Exhibits no edema.  Lymphadenopathy:    No cervical adenopathy.  Neurological: Alert and oriented to  person, place, and time. Exhibits muscle wasting. Gait normal. Coordination normal.  Skin: Skin is warm and dry. No rash noted. Not diaphoretic. No erythema. No pallor.  Psychiatric: Mood, memory and judgment normal.  Vitals reviewed.  LABORATORY DATA: Lab Results  Component Value Date   WBC 4.8 12/19/2022   HGB 12.5 (L) 12/19/2022   HCT 35.3 (L) 12/19/2022   MCV 97.0 12/19/2022   PLT 204 12/19/2022      Chemistry      Component Value Date/Time   NA 134 (L) 12/19/2022 1024   NA 140 12/24/2020 1144   NA 142 08/02/2014 1649   K 3.8 12/19/2022 1024   K 4.2 08/02/2014 1649   CL 102 12/19/2022 1024   CL 109 (H) 08/02/2014 1649   CO2 27 12/19/2022 1024   CO2 21 08/02/2014 1649   BUN 11 12/19/2022 1024   BUN 7 12/24/2020 1144   BUN 11 08/02/2014 1649   CREATININE 0.58 (L) 12/19/2022 1024   CREATININE 0.92 08/02/2014 1649      Component Value Date/Time   CALCIUM 9.2 12/19/2022 1024   CALCIUM 8.6 08/02/2014 1649   ALKPHOS 85 12/19/2022 1024   ALKPHOS 82 08/02/2014 1649   AST 21 12/19/2022 1024   ALT 25 12/19/2022 1024   ALT 132 (H) 08/02/2014 1649   BILITOT 0.3 12/19/2022 1024       RADIOGRAPHIC STUDIES:  MR Brain W Wo Contrast  Result Date: 11/28/2022 CLINICAL DATA:  Brain metastases, assess treatment response 3T SRS Protocol EXAM: MRI HEAD WITHOUT AND WITH CONTRAST TECHNIQUE: Multiplanar, multiecho pulse sequences of the brain and surrounding structures were obtained without and with intravenous contrast. CONTRAST:  5mL GADAVIST GADOBUTROL 1 MMOL/ML IV SOLN COMPARISON:  MRI 08/05/2012. FINDINGS: Brain: No evidence of acute infarct, acute hemorrhage, midline shift, hydrocephalus or extra-axial fluid collection. Punctate right cerebellar metastasis is not substantially changed (series 2600, image 120). Left temporal and right occipital metastases are no longer seen. No new enhancing lesions identified. Vascular: Major arterial flow voids are maintained at the skull base. Skull  and upper cervical spine: Normal marrow signal. Sinuses/Orbits: Largely clear sinuses.  No acute orbital findings. Other: No mastoid effusions. IMPRESSION: Punctate right cerebellar metastasis is not substantially changed. Left temporal and right occipital metastases are no longer seen. No new enhancing lesions identified. Electronically Signed   By: Feliberto Harts M.D.   On: 11/28/2022 08:47   MR Lumbar Spine W Wo Contrast  Result Date: 11/27/2022 CLINICAL DATA:  History of metastatic adenocarcinoma with a known pathologic fracture of L2 with the patient is status post vertebral augmentation. Staging examination. EXAM: MRI THORACIC AND LUMBAR SPINE WITHOUT AND WITH CONTRAST TECHNIQUE: Multiplanar and multiecho pulse sequences of the thoracic and lumbar spine were obtained without and  with intravenous contrast. CONTRAST:  5 mL GADAVIST IV SOLN COMPARISON:  PET CT scan 08/04/2022. FINDINGS: MRI THORACIC SPINE FINDINGS Alignment:  Normal. Vertebrae: There is an enhancing lesion in the right side of the T3 vertebral body measuring 1 cm transverse by 0.9 cm AP by 0.7 cm craniocaudal consistent with a metastatic deposit. A 0.8 cm in diameter metastatic deposit is identified in the posterior margin of T9 in a right paracentral position. No other evidence of metastatic disease is identified. No fracture. Cord:  Normal signal throughout.  Negative for epidural tumor. Paraspinal and other soft tissues: Negative. Disc levels: The central canal and foramina are open at all levels. Shallow central protrusion at T9-10 is noted. MRI LUMBAR SPINE FINDINGS Segmentation:  Standard. Alignment:  Maintained. Vertebrae: The patient is status post vertebral augmentation for a known pathologic L2 compression fracture. Abnormal signal is seen in the posterior elements of L2 consistent with metastatic disease. There is also a large deposit replacing approximately 50% of L3 eccentric to the right. The deposit extends into right pedicle  and facets of L3. Additional metastatic deposits are identified in the pedicles and facets of L4, the spinous process of L4, spinous process of L5 and the inferior aspect of L5. T1 hyperintensity from L1 into the sacrum is consistent with prior radiation therapy. There is no new fracture. Conus medullaris: Extends to the L1 level and appears normal. No epidural tumor is identified. Paraspinal and other soft tissues: Negative. Disc levels: L2: Retropulsed bone off the posterior aspect of L2 causes severe central canal stenosis. The foramina at L1-2 and L2-3 are mildly narrowed. L3-4: There is some facet degenerative disease on right and a shallow disc bulge. The central canal is open. Mild foraminal narrowing is worse on the right. L4-5: There is a shallow disc bulge which is more prominent to the left. Mild narrowing is seen in the left lateral recess. There is moderate left foraminal stenosis. The right foramen is open. L5-S1: A right paracentral disc protrusion mildly indents the thecal sac. The foramina are open. IMPRESSION: THORACIC SPINE: Metastatic deposits in T3 and T9, larger in T3. Negative for fracture. Negative for central canal stenosis or epidural tumor. LUMBAR SPINE: Multiple metastatic deposits, largest in L3 and L2 where the patient is status post vertebral augmentation for known pathologic fracture. Negative for new fracture or epidural tumor. Bony retropulsion off the posterior aspect of L2 causes severe central canal stenosis. Electronically Signed   By: Drusilla Kanner M.D.   On: 11/27/2022 09:37   MR THORACIC SPINE W WO CONTRAST  Result Date: 11/27/2022 CLINICAL DATA:  History of metastatic adenocarcinoma with a known pathologic fracture of L2 with the patient is status post vertebral augmentation. Staging examination. EXAM: MRI THORACIC AND LUMBAR SPINE WITHOUT AND WITH CONTRAST TECHNIQUE: Multiplanar and multiecho pulse sequences of the thoracic and lumbar spine were obtained without and  with intravenous contrast. CONTRAST:  5 mL GADAVIST IV SOLN COMPARISON:  PET CT scan 08/04/2022. FINDINGS: MRI THORACIC SPINE FINDINGS Alignment:  Normal. Vertebrae: There is an enhancing lesion in the right side of the T3 vertebral body measuring 1 cm transverse by 0.9 cm AP by 0.7 cm craniocaudal consistent with a metastatic deposit. A 0.8 cm in diameter metastatic deposit is identified in the posterior margin of T9 in a right paracentral position. No other evidence of metastatic disease is identified. No fracture. Cord:  Normal signal throughout.  Negative for epidural tumor. Paraspinal and other soft tissues: Negative. Disc levels: The  central canal and foramina are open at all levels. Shallow central protrusion at T9-10 is noted. MRI LUMBAR SPINE FINDINGS Segmentation:  Standard. Alignment:  Maintained. Vertebrae: The patient is status post vertebral augmentation for a known pathologic L2 compression fracture. Abnormal signal is seen in the posterior elements of L2 consistent with metastatic disease. There is also a large deposit replacing approximately 50% of L3 eccentric to the right. The deposit extends into right pedicle and facets of L3. Additional metastatic deposits are identified in the pedicles and facets of L4, the spinous process of L4, spinous process of L5 and the inferior aspect of L5. T1 hyperintensity from L1 into the sacrum is consistent with prior radiation therapy. There is no new fracture. Conus medullaris: Extends to the L1 level and appears normal. No epidural tumor is identified. Paraspinal and other soft tissues: Negative. Disc levels: L2: Retropulsed bone off the posterior aspect of L2 causes severe central canal stenosis. The foramina at L1-2 and L2-3 are mildly narrowed. L3-4: There is some facet degenerative disease on right and a shallow disc bulge. The central canal is open. Mild foraminal narrowing is worse on the right. L4-5: There is a shallow disc bulge which is more prominent  to the left. Mild narrowing is seen in the left lateral recess. There is moderate left foraminal stenosis. The right foramen is open. L5-S1: A right paracentral disc protrusion mildly indents the thecal sac. The foramina are open. IMPRESSION: THORACIC SPINE: Metastatic deposits in T3 and T9, larger in T3. Negative for fracture. Negative for central canal stenosis or epidural tumor. LUMBAR SPINE: Multiple metastatic deposits, largest in L3 and L2 where the patient is status post vertebral augmentation for known pathologic fracture. Negative for new fracture or epidural tumor. Bony retropulsion off the posterior aspect of L2 causes severe central canal stenosis. Electronically Signed   By: Drusilla Kanner M.D.   On: 11/27/2022 09:37     ASSESSMENT/PLAN:  This is a very pleasant 57 year old African-American male diagnosed with stage IV (T3, N2, M1 C) non-small cell lung cancer, adenocarcinoma.  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  He underwent biopsy and kyphoplasty to the L2 and to the brain metastasis.  He was diagnosed in September 2023.   His molecular studies showed no actionable mutations and his PD-L1 expression is 90%.  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   He underwent palliative radiation to L2 compression fracture as well as a solitary brain metastasis with SRS.    He is currently being treated with palliative systemic chemotherapy with carboplatin for an AUC of 5, Alimta 500 mg per metered square, Keytruda 200 mg IV every 3 weeks.  He status post 5 cycles and he tolerated well without any concerning adverse side effects.  Starting from cycle #5 he started maintenance Alimta and Keytruda.  IV every 3  weeks   The patient did recently have an MRI and follow-up with radiation oncology who saw 2 new small metastatic bone lesions.  They are going to monitor these every 3 months.  Reviewed with Dr. Arbutus Ped and the plan is the same at this time.    Labs were reviewed.  Recommend he proceed with cycle #6 today scheduled.  I will arrange for repeat CT  scan of the chest, abdomen, and pelvis prior to starting his next cycle of treatment.  We will see him back for follow-up visit in 3 weeks for evaluation repeat blood work before undergoing cycle #7.  He will continue to follow with palliative care. The patient is appreciative that his quality of life has improved since starting treatment. He is going to try to increase his activity with "water yoga" this weekend.   The patient's blood sugar was 304 today.  He has insulin.  He was instructed to take his insulin as prescribed.  I cautioned him to monitor his insulin to make sure he does not run out into always request refill prior to running out with his PCP or pharmacy.   The patient was advised to call immediately if he has any concerning symptoms in the interval. The patient voices understanding of current disease status and treatment options and is in agreement with the current care plan. All questions were answered. The patient knows to call the clinic with any problems, questions or concerns. We can certainly see the patient much sooner if necessary         Orders Placed This Encounter  Procedures   CT Chest W Contrast    Standing Status:   Future    Standing Expiration Date:   12/19/2023    Order Specific Question:   If indicated for the ordered procedure, I authorize the administration of contrast media per Radiology protocol    Answer:   Yes    Order Specific Question:   Does the patient have a contrast media/X-ray dye allergy?    Answer:   No    Order Specific Question:   Preferred imaging location?    Answer:   [facility]   CT Abdomen Pelvis W Contrast    Standing Status:   Future    Standing Expiration Date:   12/19/2023    Order Specific Question:   If indicated for the ordered procedure, I authorize the administration of contrast media per Radiology protocol    Answer:   Yes    Order Specific Question:   Does the patient have a contrast media/X-ray dye allergy?    Answer:   No    Order Specific Question:   Preferred imaging location?    Answer:   [facility]    Order Specific Question:   Is Oral Contrast requested for this exam?    Answer:   Yes, Per Radiology protocol     The total time spent in the appointment was 20-29 minutes.   Joetta Delprado L Fransico Sciandra, PA-C 12/19/22

## 2022-12-19 ENCOUNTER — Inpatient Hospital Stay (HOSPITAL_BASED_OUTPATIENT_CLINIC_OR_DEPARTMENT_OTHER): Payer: Medicaid Other | Admitting: Physician Assistant

## 2022-12-19 ENCOUNTER — Inpatient Hospital Stay: Payer: Medicaid Other

## 2022-12-19 ENCOUNTER — Other Ambulatory Visit: Payer: Medicaid Other

## 2022-12-19 ENCOUNTER — Other Ambulatory Visit: Payer: Self-pay

## 2022-12-19 VITALS — BP 124/70 | HR 88 | Temp 98.7°F | Resp 16 | Wt 137.2 lb

## 2022-12-19 DIAGNOSIS — C3490 Malignant neoplasm of unspecified part of unspecified bronchus or lung: Secondary | ICD-10-CM | POA: Diagnosis not present

## 2022-12-19 DIAGNOSIS — Z5111 Encounter for antineoplastic chemotherapy: Secondary | ICD-10-CM

## 2022-12-19 DIAGNOSIS — Z5112 Encounter for antineoplastic immunotherapy: Secondary | ICD-10-CM

## 2022-12-19 DIAGNOSIS — C7931 Secondary malignant neoplasm of brain: Secondary | ICD-10-CM

## 2022-12-19 LAB — CMP (CANCER CENTER ONLY)
ALT: 25 U/L (ref 0–44)
AST: 21 U/L (ref 15–41)
Albumin: 3.6 g/dL (ref 3.5–5.0)
Alkaline Phosphatase: 85 U/L (ref 38–126)
Anion gap: 5 (ref 5–15)
BUN: 11 mg/dL (ref 6–20)
CO2: 27 mmol/L (ref 22–32)
Calcium: 9.2 mg/dL (ref 8.9–10.3)
Chloride: 102 mmol/L (ref 98–111)
Creatinine: 0.58 mg/dL — ABNORMAL LOW (ref 0.61–1.24)
GFR, Estimated: >60 mL/min (ref >60)
Glucose, Bld: 304 mg/dL — ABNORMAL HIGH (ref 70–99)
Potassium: 3.8 mmol/L (ref 3.5–5.1)
Sodium: 134 mmol/L — ABNORMAL LOW (ref 135–145)
Total Bilirubin: 0.3 mg/dL (ref 0.3–1.2)
Total Protein: 6.9 g/dL (ref 6.5–8.1)

## 2022-12-19 LAB — CBC WITH DIFFERENTIAL (CANCER CENTER ONLY)
Abs Immature Granulocytes: 0.03 10*3/uL (ref 0.00–0.07)
Basophils Absolute: 0 10*3/uL (ref 0.0–0.1)
Basophils Relative: 0 %
Eosinophils Absolute: 0 10*3/uL (ref 0.0–0.5)
Eosinophils Relative: 1 %
HCT: 35.3 % — ABNORMAL LOW (ref 39.0–52.0)
Hemoglobin: 12.5 g/dL — ABNORMAL LOW (ref 13.0–17.0)
Immature Granulocytes: 1 %
Lymphocytes Relative: 22 %
Lymphs Abs: 1 10*3/uL (ref 0.7–4.0)
MCH: 34.3 pg — ABNORMAL HIGH (ref 26.0–34.0)
MCHC: 35.4 g/dL (ref 30.0–36.0)
MCV: 97 fL (ref 80.0–100.0)
Monocytes Absolute: 0.5 10*3/uL (ref 0.1–1.0)
Monocytes Relative: 11 %
Neutro Abs: 3.2 10*3/uL (ref 1.7–7.7)
Neutrophils Relative %: 65 %
Platelet Count: 204 10*3/uL (ref 150–400)
RBC: 3.64 MIL/uL — ABNORMAL LOW (ref 4.22–5.81)
RDW: 13.4 % (ref 11.5–15.5)
WBC Count: 4.8 10*3/uL (ref 4.0–10.5)
nRBC: 0 % (ref 0.0–0.2)

## 2022-12-19 LAB — TSH: TSH: 1.32 u[IU]/mL (ref 0.350–4.500)

## 2022-12-19 MED ORDER — SODIUM CHLORIDE 0.9% FLUSH
10.0000 mL | INTRAVENOUS | Status: DC | PRN
  Administered 2022-12-19: 10 mL

## 2022-12-19 MED ORDER — SODIUM CHLORIDE 0.9% FLUSH
10.0000 mL | Freq: Once | INTRAVENOUS | Status: AC
  Administered 2022-12-19: 10 mL

## 2022-12-19 MED ORDER — SODIUM CHLORIDE 0.9 % IV SOLN
200.0000 mg | Freq: Once | INTRAVENOUS | Status: AC
  Administered 2022-12-19: 200 mg via INTRAVENOUS
  Filled 2022-12-19: qty 200

## 2022-12-19 MED ORDER — PROCHLORPERAZINE MALEATE 10 MG PO TABS
10.0000 mg | ORAL_TABLET | Freq: Once | ORAL | Status: AC
  Administered 2022-12-19: 10 mg via ORAL
  Filled 2022-12-19: qty 1

## 2022-12-19 MED ORDER — HEPARIN SOD (PORK) LOCK FLUSH 100 UNIT/ML IV SOLN
500.0000 [IU] | Freq: Once | INTRAVENOUS | Status: AC | PRN
  Administered 2022-12-19: 500 [IU]

## 2022-12-19 MED ORDER — SODIUM CHLORIDE 0.9 % IV SOLN: Freq: Once | INTRAVENOUS | Status: AC

## 2022-12-19 MED ORDER — CYANOCOBALAMIN 1000 MCG/ML IJ SOLN
1000.0000 ug | Freq: Once | INTRAMUSCULAR | Status: AC
  Administered 2022-12-19: 1000 ug via INTRAMUSCULAR
  Filled 2022-12-19: qty 1

## 2022-12-19 MED ORDER — SODIUM CHLORIDE 0.9 % IV SOLN
500.0000 mg/m2 | Freq: Once | INTRAVENOUS | Status: AC
  Administered 2022-12-19: 800 mg via INTRAVENOUS
  Filled 2022-12-19: qty 20

## 2022-12-19 NOTE — Patient Instructions (Signed)
  [facility]  Discharge Instructions: Thank you for choosing [facility] to provide your oncology and hematology care.   If you have a lab appointment with the [facility], please go directly to the [facility] and check in at the registration area.   Wear comfortable clothing and clothing appropriate for easy access to any Portacath or PICC line.   We strive to give you quality time with your provider. You may need to reschedule your appointment if you arrive late (15 or more minutes).  Arriving late affects you and other patients whose appointments are after yours.  Also, if you miss three or more appointments without notifying the office, you may be dismissed from the clinic at the provider's discretion.      For prescription refill requests, have your pharmacy contact our office and allow 72 hours for refills to be completed.    Today you received the following chemotherapy and/or immunotherapy agents: Keytruda/Alimta   To help prevent nausea and vomiting after your treatment, we encourage you to take your nausea medication as directed.  BELOW ARE SYMPTOMS THAT SHOULD BE REPORTED IMMEDIATELY: *FEVER GREATER THAN 100.4 F (38 C) OR HIGHER *CHILLS OR SWEATING *NAUSEA AND VOMITING THAT IS NOT CONTROLLED WITH YOUR NAUSEA MEDICATION *UNUSUAL SHORTNESS OF BREATH *UNUSUAL BRUISING OR BLEEDING *URINARY PROBLEMS (pain or burning when urinating, or frequent urination) *BOWEL PROBLEMS (unusual diarrhea, constipation, pain near the anus) TENDERNESS IN MOUTH AND THROAT WITH OR WITHOUT PRESENCE OF ULCERS (sore throat, sores in mouth, or a toothache) UNUSUAL RASH, SWELLING OR PAIN  UNUSUAL VAGINAL DISCHARGE OR ITCHING   Items with * indicate a potential emergency and should be followed up as soon as possible or go to the Emergency Department if any problems should occur.  Please show the CHEMOTHERAPY ALERT CARD or IMMUNOTHERAPY ALERT CARD at  check-in to the Emergency Department and triage nurse.  Should you have questions after your visit or need to cancel or reschedule your appointment, please contact [facility]  Dept: [phone]  and follow the prompts.  Office hours are 8:00 a.m. to 4:30 p.m. Monday - Friday. Please note that voicemails left after 4:00 p.m. may not be returned until the following business day.  We are closed weekends and major holidays. You have access to a nurse at all times for urgent questions. Please call the main number to the clinic Dept: [phone] and follow the prompts.   For any non-urgent questions, you may also contact your provider using MyChart. We now offer e-Visits for anyone 38 and older to request care online for non-urgent symptoms. For details visit [email or web address].   Also download the MyChart app! Go to the app store, search "MyChart", open the app, select [facility], and log in with your MyChart username and password.

## 2022-12-20 LAB — T4: T4, Total: 8.9 ug/dL (ref 4.5–12.0)

## 2022-12-22 ENCOUNTER — Telehealth: Payer: Self-pay | Admitting: Radiation Therapy

## 2022-12-22 NOTE — Telephone Encounter (Signed)
I spoke with Samuel [name] about his upcoming brain and spine MRI scans in April and the telephone follow-up with Ashlyn to review those scans results. He has already seen the appointments in his MyChart schedule and plans to attend.   Mont Dutton R.T.(R)(T) Radiation Special Procedures Navigator

## 2022-12-28 ENCOUNTER — Telehealth: Payer: Self-pay | Admitting: Internal Medicine

## 2022-12-28 NOTE — Telephone Encounter (Signed)
Called patient regarding February-April appointments, patient is notified.

## 2023-01-06 ENCOUNTER — Ambulatory Visit (HOSPITAL_COMMUNITY)
Admission: RE | Admit: 2023-01-06 | Discharge: 2023-01-06 | Disposition: A | Discharge status: Home / Self Care | Payer: Medicaid Other | Source: Ambulatory Visit | Attending: Physician Assistant | Admitting: Physician Assistant

## 2023-01-06 DIAGNOSIS — C3490 Malignant neoplasm of unspecified part of unspecified bronchus or lung: Secondary | ICD-10-CM

## 2023-01-06 MED ORDER — IOHEXOL 300 MG/ML  SOLN
80.0000 mL | Freq: Once | INTRAMUSCULAR | Status: AC | PRN
  Administered 2023-01-06: 80 mL via INTRAVENOUS

## 2023-01-06 MED ORDER — SODIUM CHLORIDE (PF) 0.9 % IJ SOLN
INTRAMUSCULAR | Status: AC
  Filled 2023-01-06: qty 50

## 2023-01-06 MED ORDER — HEPARIN SOD (PORK) LOCK FLUSH 100 UNIT/ML IV SOLN
500.0000 [IU] | Freq: Once | INTRAVENOUS | Status: AC
  Administered 2023-01-06: 500 [IU] via INTRAVENOUS

## 2023-01-06 MED ORDER — HEPARIN SOD (PORK) LOCK FLUSH 100 UNIT/ML IV SOLN
INTRAVENOUS | Status: AC
  Filled 2023-01-06: qty 5

## 2023-01-09 ENCOUNTER — Inpatient Hospital Stay: Payer: Medicaid Other

## 2023-01-09 ENCOUNTER — Inpatient Hospital Stay (HOSPITAL_BASED_OUTPATIENT_CLINIC_OR_DEPARTMENT_OTHER): Payer: Medicaid Other | Admitting: Nurse Practitioner

## 2023-01-09 ENCOUNTER — Other Ambulatory Visit: Payer: Self-pay | Admitting: Physician Assistant

## 2023-01-09 ENCOUNTER — Inpatient Hospital Stay: Payer: Medicaid Other | Attending: Adult Health | Admitting: Internal Medicine

## 2023-01-09 ENCOUNTER — Telehealth: Payer: Self-pay

## 2023-01-09 ENCOUNTER — Encounter: Payer: Self-pay | Admitting: Internal Medicine

## 2023-01-09 ENCOUNTER — Encounter: Payer: Self-pay | Admitting: Nurse Practitioner

## 2023-01-09 DIAGNOSIS — R590 Localized enlarged lymph nodes: Secondary | ICD-10-CM | POA: Diagnosis not present

## 2023-01-09 DIAGNOSIS — C3490 Malignant neoplasm of unspecified part of unspecified bronchus or lung: Secondary | ICD-10-CM

## 2023-01-09 DIAGNOSIS — R739 Hyperglycemia, unspecified: Secondary | ICD-10-CM

## 2023-01-09 DIAGNOSIS — S2243XA Multiple fractures of ribs, bilateral, initial encounter for closed fracture: Secondary | ICD-10-CM | POA: Diagnosis not present

## 2023-01-09 DIAGNOSIS — Z515 Encounter for palliative care: Secondary | ICD-10-CM

## 2023-01-09 DIAGNOSIS — Z794 Long term (current) use of insulin: Secondary | ICD-10-CM | POA: Insufficient documentation

## 2023-01-09 DIAGNOSIS — Z79899 Other long term (current) drug therapy: Secondary | ICD-10-CM | POA: Insufficient documentation

## 2023-01-09 DIAGNOSIS — K5903 Drug induced constipation: Secondary | ICD-10-CM | POA: Diagnosis not present

## 2023-01-09 DIAGNOSIS — C7951 Secondary malignant neoplasm of bone: Secondary | ICD-10-CM

## 2023-01-09 DIAGNOSIS — Z5112 Encounter for antineoplastic immunotherapy: Secondary | ICD-10-CM | POA: Diagnosis present

## 2023-01-09 DIAGNOSIS — G893 Neoplasm related pain (acute) (chronic): Secondary | ICD-10-CM

## 2023-01-09 DIAGNOSIS — C7931 Secondary malignant neoplasm of brain: Secondary | ICD-10-CM | POA: Diagnosis not present

## 2023-01-09 DIAGNOSIS — J432 Centrilobular emphysema: Secondary | ICD-10-CM | POA: Diagnosis not present

## 2023-01-09 DIAGNOSIS — E119 Type 2 diabetes mellitus without complications: Secondary | ICD-10-CM | POA: Insufficient documentation

## 2023-01-09 LAB — CMP (CANCER CENTER ONLY)
ALT: 22 U/L (ref 0–44)
AST: 17 U/L (ref 15–41)
Albumin: 3.6 g/dL (ref 3.5–5.0)
Alkaline Phosphatase: 82 U/L (ref 38–126)
Anion gap: 9 (ref 5–15)
BUN: 10 mg/dL (ref 6–20)
CO2: 25 mmol/L (ref 22–32)
Calcium: 8.4 mg/dL — ABNORMAL LOW (ref 8.9–10.3)
Chloride: 98 mmol/L (ref 98–111)
Creatinine: 0.56 mg/dL — ABNORMAL LOW (ref 0.61–1.24)
GFR, Estimated: >60 mL/min (ref >60)
Glucose, Bld: 528 mg/dL (ref 70–99)
Potassium: 3.8 mmol/L (ref 3.5–5.1)
Sodium: 132 mmol/L — ABNORMAL LOW (ref 135–145)
Total Bilirubin: 0.3 mg/dL (ref 0.3–1.2)
Total Protein: 6.7 g/dL (ref 6.5–8.1)

## 2023-01-09 LAB — CBC WITH DIFFERENTIAL (CANCER CENTER ONLY)
Abs Immature Granulocytes: 0.05 10*3/uL (ref 0.00–0.07)
Basophils Absolute: 0 10*3/uL (ref 0.0–0.1)
Basophils Relative: 0 %
Eosinophils Absolute: 0 10*3/uL (ref 0.0–0.5)
Eosinophils Relative: 0 %
HCT: 35.7 % — ABNORMAL LOW (ref 39.0–52.0)
Hemoglobin: 12.4 g/dL — ABNORMAL LOW (ref 13.0–17.0)
Immature Granulocytes: 1 %
Lymphocytes Relative: 15 %
Lymphs Abs: 1.1 10*3/uL (ref 0.7–4.0)
MCH: 34.4 pg — ABNORMAL HIGH (ref 26.0–34.0)
MCHC: 34.7 g/dL (ref 30.0–36.0)
MCV: 99.2 fL (ref 80.0–100.0)
Monocytes Absolute: 0.5 10*3/uL (ref 0.1–1.0)
Monocytes Relative: 7 %
Neutro Abs: 5.2 10*3/uL (ref 1.7–7.7)
Neutrophils Relative %: 77 %
Platelet Count: 223 10*3/uL (ref 150–400)
RBC: 3.6 MIL/uL — ABNORMAL LOW (ref 4.22–5.81)
RDW: 12.6 % (ref 11.5–15.5)
WBC Count: 6.9 10*3/uL (ref 4.0–10.5)
nRBC: 0 % (ref 0.0–0.2)

## 2023-01-09 MED ORDER — SODIUM CHLORIDE 0.9 % IV SOLN
200.0000 mg | Freq: Once | INTRAVENOUS | Status: AC
  Administered 2023-01-09: 200 mg via INTRAVENOUS
  Filled 2023-01-09: qty 8

## 2023-01-09 MED ORDER — SODIUM CHLORIDE 0.9% FLUSH
10.0000 mL | Freq: Once | INTRAVENOUS | Status: AC
  Administered 2023-01-09: 10 mL

## 2023-01-09 MED ORDER — SODIUM CHLORIDE 0.9 % IV SOLN
500.0000 mg/m2 | Freq: Once | INTRAVENOUS | Status: AC
  Administered 2023-01-09: 800 mg via INTRAVENOUS
  Filled 2023-01-09: qty 20

## 2023-01-09 MED ORDER — SODIUM CHLORIDE 0.9 % IV SOLN: Freq: Once | INTRAVENOUS | Status: AC

## 2023-01-09 MED ORDER — INSULIN ASPART 100 UNIT/ML IJ SOLN
10.0000 [IU] | Freq: Once | INTRAMUSCULAR | Status: AC
  Administered 2023-01-09: 10 [IU] via SUBCUTANEOUS
  Filled 2023-01-09: qty 1

## 2023-01-09 MED ORDER — HEPARIN SOD (PORK) LOCK FLUSH 100 UNIT/ML IV SOLN
500.0000 [IU] | Freq: Once | INTRAVENOUS | Status: AC | PRN
  Administered 2023-01-09: 500 [IU]

## 2023-01-09 MED ORDER — PROCHLORPERAZINE MALEATE 10 MG PO TABS
10.0000 mg | ORAL_TABLET | Freq: Once | ORAL | Status: AC
  Administered 2023-01-09: 10 mg via ORAL
  Filled 2023-01-09: qty 1

## 2023-01-09 MED ORDER — SODIUM CHLORIDE 0.9% FLUSH
10.0000 mL | INTRAVENOUS | Status: DC | PRN
  Administered 2023-01-09: 10 mL

## 2023-01-09 NOTE — Progress Notes (Signed)
[facility]  [phone] Fax:(336) [phone]   Name: Samuel [name] [name] Date: 01/09/2023 MRN: [id number]  DOB: 04-01-1966  Patient Care Team: System, Provider Not In as PCP - Orlinda, Amy J, NP as Nurse Practitioner (Nurse Practitioner) Samuel Lat, Carlena Sax, NP as Nurse Practitioner (Nurse Practitioner)    INTERVAL HISTORY: Samuel [name] is a 57 y.o. 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  Palliative ask to see for symptom management and goals of care.   SOCIAL HISTORY:     reports that he quit smoking about 4 months ago. His smoking use included cigarettes. He smoked an average of 1 pack per day. He has never used smokeless tobacco. He reports that he does not currently use alcohol after a past usage of about 50.0 standard drinks of alcohol per week. He reports that he does not use drugs.  ADVANCE DIRECTIVES:  Patient does not have an advanced directives. Education and packet provided. He has expressed interest in completing. States his sister, Samuel [name] would be his designated Scientist, research (medical).   CODE STATUS: Full code  PAST MEDICAL HISTORY: Past Medical History:  Diagnosis Date   Diabetes mellitus without complication ([place])    Metastatic cancer to brain ([place]) 08/05/2022   Metastatic cancer to spine ([place]) 07/19/2022   Pancreatitis unk   Primary lung adenocarcinoma ([place]) 08/05/2022    ALLERGIES:  is allergic to penicillins and percocet [oxycodone-acetaminophen].  MEDICATIONS:  Current Outpatient Medications  Medication Sig Dispense Refill   acetaminophen (TYLENOL) 500 MG tablet Take 1,000 mg by mouth every 6 (six) hours as needed for mild pain or moderate pain.     atorvastatin (LIPITOR) 40 MG tablet Take 1 tablet (40 mg total) by mouth daily. (Patient not taking:  Reported on 12/19/2022) 30 tablet 2   blood glucose meter kit and supplies KIT Dispense based on patient and insurance preference. Use up to four times daily as directed. 1 each 0   Fingerstix Lancets MISC use as directed 431 each 0   folic acid (FOLVITE) 1 MG tablet Take 1 tablet (1 mg total) by mouth daily. 30 tablet 4   gabapentin (NEURONTIN) 300 MG capsule Take 2 capsules (600 mg total) by mouth 3 (three) times daily. 180 capsule 1   glucose blood test strip use as directed 100 each 0   insulin detemir (LEVEMIR FLEXPEN) 100 UNIT/ML FlexPen Inject into the skin.     Insulin Lispro Prot & Lispro (HUMALOG 75/25 MIX) (75-25) 100 UNIT/ML Kwikpen Inject 10 units into the skin 2 times daily with a meal 15 mL 5   Insulin Pen Needle 32G X 4 MM MISC Use with Insulin pen 100 each 3   ipratropium-albuterol (DUONEB) 0.5-2.5 (3) MG/3ML SOLN Take 3 mLs by nebulization every 6 (six) hours as needed (Shortness of breath). 360 mL 0   lidocaine-prilocaine (EMLA) cream Apply to the Port-A-Cath site 30-60 minutes before chemo. 30 g 0   morphine (MS CONTIN) 15 MG 12 hr tablet Take 1 tablet (15 mg total) by mouth every 12 (twelve) hours. 60 tablet 0   oxyCODONE (OXY IR/ROXICODONE) 5 MG immediate release tablet Take 1 - 2 tablets (5 - 10 mg total) by mouth every 4 hours as needed for severe pain. 120 tablet 0   prochlorperazine (COMPAZINE) 10 MG tablet Take 1 tablet (10 mg total) by  mouth every 6 (six) hours as needed for nausea or vomiting. (Patient not taking: Reported on 12/19/2022) 30 tablet 0   No current facility-administered medications for this visit.    VITAL SIGNS: There were no vitals taken for this visit. There were no vitals filed for this visit.  Estimated body mass index is 20.86 kg/m as calculated from the following:   Height as of 11/07/22: 5\' 8"  (1.727 m).   Weight as of 12/19/22: 137 lb 3.2 oz (62.2 kg).   PERFORMANCE STATUS (ECOG) : 1 - Symptomatic but completely ambulatory   Physical  Exam General: NAD Cardiovascular: regular rate and rhythm Pulmonary:normal breathing pattern  Extremities: no edema, no joint deformities, brace in place  Skin: no rashes Neurological: AAO x3  IMPRESSION:  Mr. Hinojos presents to clinic for symptom management follow-up. No acute distress.  Is appreciative of how well he is feeling overall.  Denies nausea, vomiting, constipation, or diarrhea.  Is remaining as active as possible.  Wearing brace as advised.  Neoplasm related pain Samuel [name] reports pain is well controlled. Tolerating MS Contin and oxycodone for breakthrough pain. He is only taking MS Contin at bedtime given improvement in pain.  Pain tends to be worse later in the evening and during the night. Not requiring breakthrough medication around the clock. He is appropriately taking medications and refills in line with administration schedule.   Patient pain contract was signed at previous visit on 10/23.  2.  Appetite After diet continues to improve.  Current weight 144 pounds up from 137 pounds on 1/29.  PLAN: MS Contin 15 mg at bedtime only  Oxy IR 5-10 mg every 4-6 hours as needed for breakthrough pain.  Is only taken 1-2 times daily. Colace twice daily Narcan for emergency use Appetite much improved.Weight is increasing. Pain contract signed on 10/23. I will plan to see patient back in 3-4 weeks in collaboration with his other oncology appointments.   Patient expressed understanding and was in agreement with this plan. He also understands that He can call the clinic at any time with any questions, concerns, or complaints.        Any controlled substances utilized were prescribed in the context of palliative care. PDMP has been reviewed.    Time Total: 20 min   Visit consisted of counseling and education dealing with the complex and emotionally intense issues of symptom management and palliative care in the setting of serious and potentially life-threatening illness.Greater  than 50%  of this time was spent counseling and coordinating care related to the above assessment and plan.  Alda Lea, AGPCNP-BC  Palliative Medicine Team/[facility]

## 2023-01-09 NOTE — Telephone Encounter (Signed)
CRITICAL VALUE STICKER  CRITICAL VALUE: Glucose 528  RECEIVER (on-site recipient of call): Maurine Simmering CMA  DATE & TIME NOTIFIED: 01/09/2023 1434  MESSENGER (representative from lab): Heather in Lab  MD NOTIFIED: Dr. Julien Nordmann  TIME OF NOTIFICATION: 7366  RESPONSE: Made nurse and physician aware.

## 2023-01-09 NOTE — Progress Notes (Signed)
[facility] [phone]   Fax:(336) [phone]  OFFICE PROGRESS NOTE  System, Provider Not In No address on file  DIAGNOSIS: stage IV (T3a, N2, M1 C) non-small cell lung cancer, adenocarcinoma presented with right pulmonary nodules in addition to right hilar and mediastinal lymphadenopathy, pleural effusion, brain metastasis as well as extensive metastatic bone disease in the lumbar spines status post biopsy with kyphoplasty at the L2 lesion and brain metastasis diagnosed in September 2023.  Biomarker Findings Microsatellite status - MS-Stable Tumor Mutational Burden - 8 Muts/Mb Genomic Findings For a complete list of the genes assayed, please refer to the Appendix. KEAP1 Q227* CDKN2A/B [id number] G89V PRKCI amplification TERC amplification - equivocal? TP53 D237fs*18 8 Disease relevant genes with no reportable alterations: ALK, BRAF, EGFR, ERBB2, KRAS, MET, RET, ROS1  PDL1 TPS  90%  PRIOR THERAPY:  1) Status post palliative radiotherapy to the L2 lesion under the care of Dr. Tammi Klippel. 2) Status post SRS to solitary brain metastasis under the care of Dr. Tammi Klippel  CURRENT THERAPY: Palliative systemic therapy with carboplatin for AUC of 5, Alimta 500 Mg/M2 and Keytruda 200 Mg IV every 3 weeks.  First dose September 05, 2022.  Status post 6 cycles.  Starting from cycle #5 the patient will be on maintenance treatment with Alimta and Keytruda every 3 weeks.  INTERVAL HISTORY: Samuel [name] 57 y.o. male returns to the clinic today for follow-up visit.  The patient is feeling fine today with no concerning complaints except for occasional pain in the back.  He is followed by the palliative care team and currently on pain medication with MS Contin 15 mg p.o. twice daily in addition to Oxy IR for breakthrough pain management.  He denied having any current chest pain, shortness of breath, cough or hemoptysis.  He has no nausea, vomiting, diarrhea or constipation.   He has no headache or visual changes.  He has been tolerating his maintenance treatment with Alimta and Keytruda fairly well.  The patient had repeat CT scan of the chest, abdomen and pelvis performed recently and he is here for evaluation and discussion of his scan results.  MEDICAL HISTORY: Past Medical History:  Diagnosis Date   Diabetes mellitus without complication ([place])    Metastatic cancer to brain ([place]) 08/05/2022   Metastatic cancer to spine ([place]) 07/19/2022   Pancreatitis unk   Primary lung adenocarcinoma ([place]) 08/05/2022    ALLERGIES:  is allergic to penicillins and percocet [oxycodone-acetaminophen].  MEDICATIONS:  Current Outpatient Medications  Medication Sig Dispense Refill   acetaminophen (TYLENOL) 500 MG tablet Take 1,000 mg by mouth every 6 (six) hours as needed for mild pain or moderate pain.     atorvastatin (LIPITOR) 40 MG tablet Take 1 tablet (40 mg total) by mouth daily. (Patient not taking: Reported on 12/19/2022) 30 tablet 2   blood glucose meter kit and supplies KIT Dispense based on patient and insurance preference. Use up to four times daily as directed. 1 each 0   Fingerstix Lancets MISC use as directed 850 each 0   folic acid (FOLVITE) 1 MG tablet Take 1 tablet (1 mg total) by mouth daily. 30 tablet 4   gabapentin (NEURONTIN) 300 MG capsule Take 2 capsules (600 mg total) by mouth 3 (three) times daily. 180 capsule 1   glucose blood test strip use as directed 100 each 0   insulin detemir (LEVEMIR FLEXPEN) 100 UNIT/ML FlexPen Inject into the skin.     Insulin  Lispro Prot & Lispro (HUMALOG 75/25 MIX) (75-25) 100 UNIT/ML Kwikpen Inject 10 units into the skin 2 times daily with a meal 15 mL 5   Insulin Pen Needle 32G X 4 MM MISC Use with Insulin pen 100 each 3   ipratropium-albuterol (DUONEB) 0.5-2.5 (3) MG/3ML SOLN Take 3 mLs by nebulization every 6 (six) hours as needed (Shortness of breath). 360 mL 0   lidocaine-prilocaine (EMLA) cream Apply to the Port-A-Cath site  30-60 minutes before chemo. 30 g 0   morphine (MS CONTIN) 15 MG 12 hr tablet Take 1 tablet (15 mg total) by mouth every 12 (twelve) hours. 60 tablet 0   oxyCODONE (OXY IR/ROXICODONE) 5 MG immediate release tablet Take 1 - 2 tablets (5 - 10 mg total) by mouth every 4 hours as needed for severe pain. 120 tablet 0   prochlorperazine (COMPAZINE) 10 MG tablet Take 1 tablet (10 mg total) by mouth every 6 (six) hours as needed for nausea or vomiting. (Patient not taking: Reported on 12/19/2022) 30 tablet 0   No current facility-administered medications for this visit.    SURGICAL HISTORY:  Past Surgical History:  Procedure Laterality Date   IR BONE TUMOR(S)RF ABLATION  08/01/2022   IR IMAGING GUIDED PORT INSERTION  09/21/2022   IR KYPHO LUMBAR INC FX REDUCE BONE BX UNI/BIL CANNULATION INC/IMAGING  08/01/2022    REVIEW OF SYSTEMS:  Constitutional: positive for fatigue Eyes: negative Ears, nose, mouth, throat, and face: negative Respiratory: negative Cardiovascular: negative Gastrointestinal: negative Genitourinary:negative Integument/breast: negative Hematologic/lymphatic: negative Musculoskeletal:positive for back pain Neurological: negative Behavioral/Psych: negative Endocrine: negative Allergic/Immunologic: negative   PHYSICAL EXAMINATION: General appearance: alert, cooperative, fatigued, and no distress Head: Normocephalic, without obvious abnormality, atraumatic Neck: no adenopathy, no JVD, supple, symmetrical, trachea midline, and thyroid not enlarged, symmetric, no tenderness/mass/nodules Lymph nodes: Cervical, supraclavicular, and axillary nodes normal. Resp: clear to auscultation bilaterally Back: symmetric, no curvature. ROM normal. No CVA tenderness. Cardio: regular rate and rhythm, S1, S2 normal, no murmur, click, rub or gallop GI: soft, non-tender; bowel sounds normal; no masses,  no organomegaly Extremities: extremities normal, atraumatic, no cyanosis or edema Neurologic:  Alert and oriented X 3, normal strength and tone. Normal symmetric reflexes. Normal coordination and gait  ECOG PERFORMANCE STATUS: 1 - Symptomatic but completely ambulatory  Blood pressure 97/86, pulse 100, temperature 98.6 F (37 C), temperature source Oral, resp. rate 17, weight 144 lb (65.3 kg), SpO2 100 %.  LABORATORY DATA: Lab Results  Component Value Date   WBC 6.9 01/09/2023   HGB 12.4 (L) 01/09/2023   HCT 35.7 (L) 01/09/2023   MCV 99.2 01/09/2023   PLT 223 01/09/2023      Chemistry      Component Value Date/Time   NA 134 (L) 12/19/2022 1024   NA 140 12/24/2020 1144   NA 142 08/02/2014 1649   K 3.8 12/19/2022 1024   K 4.2 08/02/2014 1649   CL 102 12/19/2022 1024   CL 109 (H) 08/02/2014 1649   CO2 27 12/19/2022 1024   CO2 21 08/02/2014 1649   BUN 11 12/19/2022 1024   BUN 7 12/24/2020 1144   BUN 11 08/02/2014 1649   CREATININE 0.58 (L) 12/19/2022 1024   CREATININE 0.92 08/02/2014 1649      Component Value Date/Time   CALCIUM 9.2 12/19/2022 1024   CALCIUM 8.6 08/02/2014 1649   ALKPHOS 85 12/19/2022 1024   ALKPHOS 82 08/02/2014 1649   AST 21 12/19/2022 1024   ALT 25 12/19/2022 1024  ALT 132 (H) 08/02/2014 1649   BILITOT 0.3 12/19/2022 1024       RADIOGRAPHIC STUDIES: CT Chest W Contrast  Result Date: 01/08/2023 CLINICAL DATA:  Non-small-cell lung cancer. Restaging. * Tracking Code: BO * EXAM: CT CHEST, ABDOMEN, AND PELVIS WITH CONTRAST TECHNIQUE: Multidetector CT imaging of the chest, abdomen and pelvis was performed following the standard protocol during bolus administration of intravenous contrast. RADIATION DOSE REDUCTION: This exam was performed according to the departmental dose-optimization program which includes automated exposure control, adjustment of the mA and/or kV according to patient size and/or use of iterative reconstruction technique. CONTRAST:  43mL OMNIPAQUE IOHEXOL 300 MG/ML  SOLN COMPARISON:  11/03/2022 chest CT.  PET-CT 08/04/2022.  FINDINGS: CT CHEST FINDINGS Cardiovascular: The heart size is normal. No substantial pericardial effusion. Mild atherosclerotic calcification is noted in the wall of the thoracic aorta. Right Port-A-Cath tip is positioned in the right atrium. Mediastinum/Nodes: No mediastinal lymphadenopathy. There is no hilar lymphadenopathy. The esophagus has normal imaging features. There is no axillary lymphadenopathy. Lungs/Pleura: Centrilobular and paraseptal emphysema evident. Musculoskeletal: The mixed lytic and sclerotic changes in the T3 vertebral body are similar to prior (see axial 27/4 and well demonstrated on sagittal 94/6). This was better evaluated by MRI on 11/25/2022. A T9 lesion seen on that study is not readily evident on CT imaging. No new worrisome lytic or sclerotic osseous abnormality. Multiple bilateral nonacute rib fractures evident. CT ABDOMEN PELVIS FINDINGS Hepatobiliary: 4 mm hypodensity posterior right liver on 63/2 is stable since CT scan of 03/20/2020 consistent with benign etiology. There is no evidence for gallstones, gallbladder wall thickening, or pericholecystic fluid. No intrahepatic or extrahepatic biliary dilation. Pancreas: No focal mass lesion. No dilatation of the main duct. No intraparenchymal cyst. No peripancreatic edema. Spleen: No splenomegaly. No focal mass lesion. Adrenals/Urinary Tract: No adrenal nodule or mass. 1-2 mm nonobstructing interpolar right renal stone noted on 64/2. Left kidney unremarkable. No evidence for hydroureter. The urinary bladder appears normal for the degree of distention. Stomach/Bowel: Stomach is distended with food and fluid. Duodenum is normally positioned as is the ligament of Treitz. No small bowel wall thickening. No small bowel dilatation. Neither the terminal ileum nor the appendix are discretely visible. Prominent stool volume noted transverse and left colon to the level of the rectum. Vascular/Lymphatic: There is moderate atherosclerotic  calcification of the abdominal aorta without aneurysm. There is no gastrohepatic or hepatoduodenal ligament lymphadenopathy. No retroperitoneal or mesenteric lymphadenopathy. No pelvic sidewall lymphadenopathy. Reproductive: The prostate gland and seminal vesicles are unremarkable. Other: No intraperitoneal free fluid. Musculoskeletal: Status post vertebral augmentation at L2. Metastatic lesion in the L3 vertebral body again noted, better characterized on recent lumbar spine MRI of 11/25/2022. Body wall edema noted in the lower abdomen and pelvis. IMPRESSION: 1. Stable CT chest since 11/03/2022. No evidence for recurrent or metastatic disease in the thorax. 2. No findings to suggest metastatic disease in the abdomen or pelvis on today's study. 3. Multiple metastatic lesions identified in the thoracolumbar spine, as characterized on MR imaging 11/25/2022. 4. 1-2 mm nonobstructing interpolar right renal stone. 5. Prominent stool volume transverse and left colon to the level of the rectum. Imaging features could be compatible with constipation in the appropriate clinical setting. 6. Aortic Atherosclerosis (ICD10-I70.0) and Emphysema (ICD10-J43.9). Electronically Signed   By: Misty Stanley M.D.   On: 01/08/2023 07:19   CT Abdomen Pelvis W Contrast  Result Date: 01/08/2023 CLINICAL DATA:  Non-small-cell lung cancer. Restaging. * Tracking Code: BO * EXAM: CT  CHEST, ABDOMEN, AND PELVIS WITH CONTRAST TECHNIQUE: Multidetector CT imaging of the chest, abdomen and pelvis was performed following the standard protocol during bolus administration of intravenous contrast. RADIATION DOSE REDUCTION: This exam was performed according to the departmental dose-optimization program which includes automated exposure control, adjustment of the mA and/or kV according to patient size and/or use of iterative reconstruction technique. CONTRAST:  69mL OMNIPAQUE IOHEXOL 300 MG/ML  SOLN COMPARISON:  11/03/2022 chest CT.  PET-CT 08/04/2022.  FINDINGS: CT CHEST FINDINGS Cardiovascular: The heart size is normal. No substantial pericardial effusion. Mild atherosclerotic calcification is noted in the wall of the thoracic aorta. Right Port-A-Cath tip is positioned in the right atrium. Mediastinum/Nodes: No mediastinal lymphadenopathy. There is no hilar lymphadenopathy. The esophagus has normal imaging features. There is no axillary lymphadenopathy. Lungs/Pleura: Centrilobular and paraseptal emphysema evident. Musculoskeletal: The mixed lytic and sclerotic changes in the T3 vertebral body are similar to prior (see axial 27/4 and well demonstrated on sagittal 94/6). This was better evaluated by MRI on 11/25/2022. A T9 lesion seen on that study is not readily evident on CT imaging. No new worrisome lytic or sclerotic osseous abnormality. Multiple bilateral nonacute rib fractures evident. CT ABDOMEN PELVIS FINDINGS Hepatobiliary: 4 mm hypodensity posterior right liver on 63/2 is stable since CT scan of 03/20/2020 consistent with benign etiology. There is no evidence for gallstones, gallbladder wall thickening, or pericholecystic fluid. No intrahepatic or extrahepatic biliary dilation. Pancreas: No focal mass lesion. No dilatation of the main duct. No intraparenchymal cyst. No peripancreatic edema. Spleen: No splenomegaly. No focal mass lesion. Adrenals/Urinary Tract: No adrenal nodule or mass. 1-2 mm nonobstructing interpolar right renal stone noted on 64/2. Left kidney unremarkable. No evidence for hydroureter. The urinary bladder appears normal for the degree of distention. Stomach/Bowel: Stomach is distended with food and fluid. Duodenum is normally positioned as is the ligament of Treitz. No small bowel wall thickening. No small bowel dilatation. Neither the terminal ileum nor the appendix are discretely visible. Prominent stool volume noted transverse and left colon to the level of the rectum. Vascular/Lymphatic: There is moderate atherosclerotic  calcification of the abdominal aorta without aneurysm. There is no gastrohepatic or hepatoduodenal ligament lymphadenopathy. No retroperitoneal or mesenteric lymphadenopathy. No pelvic sidewall lymphadenopathy. Reproductive: The prostate gland and seminal vesicles are unremarkable. Other: No intraperitoneal free fluid. Musculoskeletal: Status post vertebral augmentation at L2. Metastatic lesion in the L3 vertebral body again noted, better characterized on recent lumbar spine MRI of 11/25/2022. Body wall edema noted in the lower abdomen and pelvis. IMPRESSION: 1. Stable CT chest since 11/03/2022. No evidence for recurrent or metastatic disease in the thorax. 2. No findings to suggest metastatic disease in the abdomen or pelvis on today's study. 3. Multiple metastatic lesions identified in the thoracolumbar spine, as characterized on MR imaging 11/25/2022. 4. 1-2 mm nonobstructing interpolar right renal stone. 5. Prominent stool volume transverse and left colon to the level of the rectum. Imaging features could be compatible with constipation in the appropriate clinical setting. 6. Aortic Atherosclerosis (ICD10-I70.0) and Emphysema (ICD10-J43.9). Electronically Signed   By: Misty Stanley M.D.   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 He has molecular studies by foundation 1 that showed no actionable mutations and PD-L1 expression was 90%. He underwent palliative radiotherapy to the L2 vertebral body compression fraction as well as solitary brain metastasis with SRS. He is  currently undergoing palliative systemic chemotherapy with carboplatin for AUC of 5, Alimta 500 Mg/M2 and Keytruda 200 Mg IV every 3 weeks.  Status post 6 cycles.  Starting from cycle #5 he is on treatment with maintenance Alimta and Keytruda every 3 weeks. The patient has been tolerating this treatment fairly well with no concerning adverse effects. He had repeat CT scan of the chest, abdomen and pelvis performed recently.  I personally and independently reviewed the scan and discussed the results with the patient today. His scan showed no concerning findings for disease progression. I recommended for the patient to continue his current treatment with the maintenance Alimta and Keytruda every 3 weeks. I will see him back for follow-up visit in 3 weeks for evaluation before the next cycle of his treatment. For the pain management, he is currently on MS Contin 15 mg p.o. twice daily in addition to Oxy IR for breakthrough pain and managed by the palliative care team.  The patient was advised to call immediately if he has any other concerning symptoms in the interval. The patient voices understanding of current disease status and treatment options and is in agreement with the current care plan.  All questions were answered. The patient knows to call the clinic with any problems, questions or concerns. We can certainly see the patient much sooner if necessary.  The total time spent in the appointment was 30 minutes.  Disclaimer: This note was dictated with voice recognition software. Similar sounding words can inadvertently be transcribed and may not be corrected upon review.

## 2023-01-09 NOTE — Patient Instructions (Signed)
[facility]  Discharge Instructions: Thank you for choosing [facility] to provide your oncology and hematology care.   If you have a lab appointment with the [facility], please go directly to the [facility] and check in at the registration area.   Wear comfortable clothing and clothing appropriate for easy access to any Portacath or PICC line.   We strive to give you quality time with your provider. You may need to reschedule your appointment if you arrive late (15 or more minutes).  Arriving late affects you and other patients whose appointments are after yours.  Also, if you miss three or more appointments without notifying the office, you may be dismissed from the clinic at the provider's discretion.      For prescription refill requests, have your pharmacy contact our office and allow 72 hours for refills to be completed.    Today you received the following chemotherapy and/or immunotherapy agents: Keytruda & Alimta        To help prevent nausea and vomiting after your treatment, we encourage you to take your nausea medication as directed.  BELOW ARE SYMPTOMS THAT SHOULD BE REPORTED IMMEDIATELY: *FEVER GREATER THAN 100.4 F (38 C) OR HIGHER *CHILLS OR SWEATING *NAUSEA AND VOMITING THAT IS NOT CONTROLLED WITH YOUR NAUSEA MEDICATION *UNUSUAL SHORTNESS OF BREATH *UNUSUAL BRUISING OR BLEEDING *URINARY PROBLEMS (pain or burning when urinating, or frequent urination) *BOWEL PROBLEMS (unusual diarrhea, constipation, pain near the anus) TENDERNESS IN MOUTH AND THROAT WITH OR WITHOUT PRESENCE OF ULCERS (sore throat, sores in mouth, or a toothache) UNUSUAL RASH, SWELLING OR PAIN  UNUSUAL VAGINAL DISCHARGE OR ITCHING   Items with * indicate a potential emergency and should be followed up as soon as possible or go to the Emergency Department if any problems should occur.  Please show the CHEMOTHERAPY ALERT CARD or IMMUNOTHERAPY ALERT  CARD at check-in to the Emergency Department and triage nurse.  Should you have questions after your visit or need to cancel or reschedule your appointment, please contact [facility]  Dept: [phone]  and follow the prompts.  Office hours are 8:00 a.m. to 4:30 p.m. Monday - Friday. Please note that voicemails left after 4:00 p.m. may not be returned until the following business day.  We are closed weekends and major holidays. You have access to a nurse at all times for urgent questions. Please call the main number to the clinic Dept: [phone] and follow the prompts.   For any non-urgent questions, you may also contact your provider using MyChart. We now offer e-Visits for anyone 31 and older to request care online for non-urgent symptoms. For details visit [email or web address].   Also download the MyChart app! Go to the app store, search "MyChart", open the app, select [facility], and log in with your MyChart username and password.

## 2023-01-19 ENCOUNTER — Ambulatory Visit: Payer: Medicaid Other | Attending: Family Medicine | Admitting: Critical Care Medicine

## 2023-01-19 ENCOUNTER — Encounter: Payer: Self-pay | Admitting: Critical Care Medicine

## 2023-01-19 VITALS — BP 110/70 | HR 89

## 2023-01-19 DIAGNOSIS — C7951 Secondary malignant neoplasm of bone: Secondary | ICD-10-CM | POA: Diagnosis not present

## 2023-01-19 DIAGNOSIS — G893 Neoplasm related pain (acute) (chronic): Secondary | ICD-10-CM | POA: Diagnosis not present

## 2023-01-19 DIAGNOSIS — C7931 Secondary malignant neoplasm of brain: Secondary | ICD-10-CM | POA: Insufficient documentation

## 2023-01-19 DIAGNOSIS — Z7984 Long term (current) use of oral hypoglycemic drugs: Secondary | ICD-10-CM | POA: Insufficient documentation

## 2023-01-19 DIAGNOSIS — M792 Neuralgia and neuritis, unspecified: Secondary | ICD-10-CM | POA: Diagnosis not present

## 2023-01-19 DIAGNOSIS — Z794 Long term (current) use of insulin: Secondary | ICD-10-CM | POA: Insufficient documentation

## 2023-01-19 DIAGNOSIS — F17210 Nicotine dependence, cigarettes, uncomplicated: Secondary | ICD-10-CM | POA: Diagnosis not present

## 2023-01-19 DIAGNOSIS — Z515 Encounter for palliative care: Secondary | ICD-10-CM | POA: Diagnosis not present

## 2023-01-19 DIAGNOSIS — R809 Proteinuria, unspecified: Secondary | ICD-10-CM | POA: Insufficient documentation

## 2023-01-19 DIAGNOSIS — Z85118 Personal history of other malignant neoplasm of bronchus and lung: Secondary | ICD-10-CM | POA: Diagnosis not present

## 2023-01-19 DIAGNOSIS — F322 Major depressive disorder, single episode, severe without psychotic features: Secondary | ICD-10-CM | POA: Diagnosis not present

## 2023-01-19 DIAGNOSIS — F17200 Nicotine dependence, unspecified, uncomplicated: Secondary | ICD-10-CM

## 2023-01-19 DIAGNOSIS — E1142 Type 2 diabetes mellitus with diabetic polyneuropathy: Secondary | ICD-10-CM | POA: Diagnosis present

## 2023-01-19 DIAGNOSIS — E782 Mixed hyperlipidemia: Secondary | ICD-10-CM

## 2023-01-19 DIAGNOSIS — J90 Pleural effusion, not elsewhere classified: Secondary | ICD-10-CM | POA: Insufficient documentation

## 2023-01-19 DIAGNOSIS — K029 Dental caries, unspecified: Secondary | ICD-10-CM

## 2023-01-19 DIAGNOSIS — R45851 Suicidal ideations: Secondary | ICD-10-CM

## 2023-01-19 DIAGNOSIS — E1129 Type 2 diabetes mellitus with other diabetic kidney complication: Secondary | ICD-10-CM

## 2023-01-19 DIAGNOSIS — Z79899 Other long term (current) drug therapy: Secondary | ICD-10-CM | POA: Diagnosis not present

## 2023-01-19 DIAGNOSIS — E559 Vitamin D deficiency, unspecified: Secondary | ICD-10-CM

## 2023-01-19 DIAGNOSIS — R627 Adult failure to thrive: Secondary | ICD-10-CM

## 2023-01-19 DIAGNOSIS — E119 Type 2 diabetes mellitus without complications: Secondary | ICD-10-CM

## 2023-01-19 DIAGNOSIS — C3490 Malignant neoplasm of unspecified part of unspecified bronchus or lung: Secondary | ICD-10-CM

## 2023-01-19 MED ORDER — ATORVASTATIN CALCIUM 40 MG PO TABS: 40.0000 mg | ORAL_TABLET | Freq: Every day | ORAL | 2 refills | Status: DC

## 2023-01-19 MED ORDER — GABAPENTIN 300 MG PO CAPS: 600.0000 mg | ORAL_CAPSULE | Freq: Three times a day (TID) | ORAL | 1 refills | Status: DC

## 2023-01-19 MED ORDER — NOVOLOG MIX 70/30 FLEXPEN (70-30) 100 UNIT/ML ~~LOC~~ SUPN: 25.0000 [IU] | PEN_INJECTOR | Freq: Two times a day (BID) | SUBCUTANEOUS | 5 refills | Status: DC

## 2023-01-19 MED ORDER — GLUCOSE BLOOD VI STRP: ORAL_STRIP | 12 refills | Status: DC

## 2023-01-19 MED ORDER — EMPAGLIFLOZIN 10 MG PO TABS: 10.0000 mg | ORAL_TABLET | Freq: Every day | ORAL | 4 refills | Status: DC

## 2023-01-19 MED ORDER — IPRATROPIUM-ALBUTEROL 0.5-2.5 (3) MG/3ML IN SOLN: 3.0000 mL | Freq: Four times a day (QID) | RESPIRATORY_TRACT | 0 refills | Status: DC | PRN

## 2023-01-19 MED ORDER — FOLIC ACID 1 MG PO TABS: 1.0000 mg | ORAL_TABLET | Freq: Every day | ORAL | 4 refills | Status: DC

## 2023-01-19 NOTE — Assessment & Plan Note (Signed)
Management per oncology

## 2023-01-19 NOTE — Assessment & Plan Note (Signed)
Continue gabapentin.

## 2023-01-19 NOTE — Assessment & Plan Note (Signed)
Has gained some weight

## 2023-01-19 NOTE — Assessment & Plan Note (Signed)
Currently stable.

## 2023-01-19 NOTE — Progress Notes (Signed)
New Patient Office Visit  Subjective    Patient ID: Samuel [name], male    DOB: [birth year]  Age: 57 y.o. MRN: [id number]  CC:  Chief Complaint  Patient presents with   Establish Care    HPI Samuel [name] presents to establish care This is a 57 year old male who has a history of adenocarcinoma of the lung undergoing treatment as documented below by the cancer center.  He is also seen by palliative medicine for pain control.  He had has had mets to the brain and the thoracolumbar spine.  P patient also has type 2 diabetes and is on an insulin mix 7030.  He is only on 10 units twice daily and on arrival A1c is quite elevated.  He states his blood sugars run anywhere from 2 50-300.  At the oncology center it was over 500 recently.  He cannot tolerate metformin.  He is on no other medications for his diabetes.  He cannot tolerate GLP's.  Patient's oncologic status is slowly getting under control and he is still on cyclic chemotherapy.  He underwent radiation to the brain and spine already.  Last CT of the chest showed stable disease.  Abdominal CT did not show abdominal or liver involvement. 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   Biomarker Findings Microsatellite status - MS-Stable Tumor Mutational Burden - 8 Muts/Mb Genomic Findings For a complete list of the genes assayed, please refer to the Appendix. KEAP1 Q227* CDKN2A/B [id number] G89V PRKCI amplification TERC amplification - equivocal? TP53 [id number]*18 8 Disease relevant genes with no reportable alterations: ALK, BRAF, EGFR, ERBB2, KRAS, MET,  RET, ROS1   PDL1 TPS  90%   PRIOR THERAPY:  1) Status post palliative radiotherapy to the L2 lesion under the care of Dr. MTammi Klippel 2) Status post SRS to solitary brain metastasis under the care of Dr. MTammi Klippel  CURRENT THERAPY: Palliative systemic therapy with carboplatin for AUC of 5, Alimta 500 Mg/M2 and Keytruda 200 Mg IV every 3 weeks.  First dose September 05, 2022.  Status post 6 cycles.  Starting from cycle #5 the patient will be on maintenance treatment with Alimta and Keytruda every 3 weeks.   INTERVAL HISTORY: Samuel [name] 57y.o. male returns to the clinic today for follow-up visit.  The patient is feeling fine today with no concerning complaints except for occasional pain in the back.  He is followed by the palliative care team and currently on pain medication with MS Contin 15 mg p.o. twice daily in addition to Oxy IR for breakthrough pain management.  He denied having any current chest pain, shortness of breath, cough or hemoptysis.  He has no nausea, vomiting, diarrhea or constipation.  He has no headache or visual changes.  He has been tolerating his maintenance treatment with Alimta and Keytruda fairly well.  The patient had repeat CT scan of the chest, abdomen and pelvis performed recently and he is here for evaluation and discussion of his scan results.   MEDICAL HISTORY:     Past Medical History:  Diagnosis Date   Diabetes mellitus without complication ([place]     Metastatic cancer  to brain [facility]) 08/05/2022   Metastatic cancer to spine ([place]) 07/19/2022   Pancreatitis unk   Primary lung adenocarcinoma ([place]) 08/05/2022      ALLERGIES:  is allergic to penicillins and percocet [oxycodone-acetaminophen].   MEDICATIONS:        Current Outpatient Medications  Medication Sig Dispense Refill   acetaminophen (TYLENOL) 500 MG tablet Take 1,000 mg by mouth every 6 (six) hours as needed for mild pain or moderate pain.       atorvastatin (LIPITOR) 40 MG tablet Take 1 tablet (40  mg total) by mouth daily. (Patient not taking: Reported on 12/19/2022) 30 tablet 2   blood glucose meter kit and supplies KIT Dispense based on patient and insurance preference. Use up to four times daily as directed. 1 each 0   Fingerstix Lancets MISC use as directed [id number] each 0   folic acid (FOLVITE) 1 MG tablet Take 1 tablet (1 mg total) by mouth daily. 30 tablet 4   gabapentin (NEURONTIN) 300 MG capsule Take 2 capsules (600 mg total) by mouth 3 (three) times daily. 180 capsule 1   glucose blood test strip use as directed 100 each 0   insulin detemir (LEVEMIR FLEXPEN) 100 UNIT/ML FlexPen Inject into the skin.       Insulin Lispro Prot & Lispro (HUMALOG 75/25 MIX) (75-25) 100 UNIT/ML Kwikpen Inject 10 units into the skin 2 times daily with a meal 15 mL 5   Insulin Pen Needle 32G X 4 MM MISC Use with Insulin pen 100 each 3   ipratropium-albuterol (DUONEB) 0.5-2.5 (3) MG/3ML SOLN Take 3 mLs by nebulization every 6 (six) hours as needed (Shortness of breath). 360 mL 0   lidocaine-prilocaine (EMLA) cream Apply to the Port-A-Cath site 30-60 minutes before chemo. 30 g 0   morphine (MS CONTIN) 15 MG 12 hr tablet Take 1 tablet (15 mg total) by mouth every 12 (twelve) hours. 60 tablet 0   oxyCODONE (OXY IR/ROXICODONE) 5 MG immediate release tablet Take 1 - 2 tablets (5 - 10 mg total) by mouth every 4 hours as needed for severe pain. 120 tablet 0   prochlorperazine (COMPAZINE) 10 MG tablet Take 1 tablet (10 mg total) by mouth every 6 (six) hours as needed for nausea or vomiting. (Patient not taking: Reported on 12/19/2022) 30 tablet 0    No current facility-administered medications for this visit.      SURGICAL HISTORY:       Past Surgical History:  Procedure Laterality Date   IR BONE TUMOR(S)RF ABLATION   08/01/2022   IR IMAGING GUIDED PORT INSERTION   09/21/2022   IR KYPHO LUMBAR INC FX REDUCE BONE BX UNI/BIL CANNULATION INC/IMAGING   08/01/2022      REVIEW OF SYSTEMS:  Constitutional: positive for  fatigue Eyes: negative Ears, nose, mouth, throat, and face: negative Respiratory: negative Cardiovascular: negative Gastrointestinal: negative Genitourinary:negative Integument/breast: negative Hematologic/lymphatic: negative Musculoskeletal:positive for back pain Neurological: negative Behavioral/Psych: negative Endocrine: negative Allergic/Immunologic: negative    PHYSICAL EXAMINATION: General appearance: alert, cooperative, fatigued, and no distress Head: Normocephalic, without obvious abnormality, atraumatic Neck: no adenopathy, no JVD, supple, symmetrical, trachea midline, and thyroid not enlarged, symmetric, no tenderness/mass/nodules Lymph nodes: Cervical, supraclavicular, and axillary nodes normal. Resp: clear to auscultation bilaterally Back: symmetric, no curvature. ROM normal. No CVA tenderness. Cardio: regular rate and rhythm, S1, S2 normal, no murmur, click, rub or gallop GI: soft, non-tender; bowel sounds normal; no masses,  no organomegaly Extremities: extremities normal, atraumatic, no cyanosis or edema Neurologic:  Alert and oriented X 3, normal strength and tone. Normal symmetric reflexes. Normal coordination and gait   ECOG PERFORMANCE STATUS: 1 - Symptomatic but completely ambulatory   Blood pressure 97/86, pulse 100, temperature 98.6 F (37 C), temperature source Oral, resp. rate 17, weight 144 lb (65.3 kg), SpO2 100 %.   LABORATORY DATA: Recent Labs       Lab Results  Component Value Date    WBC 6.9 01/09/2023    HGB 12.4 (L) 01/09/2023    HCT 35.7 (L) 01/09/2023    MCV 99.2 01/09/2023    PLT 223 01/09/2023          Chemistry    Labs (Brief)          Component Value Date/Time    NA 134 (L) 12/19/2022 1024    NA 140 12/24/2020 1144    NA 142 08/02/2014 1649    K 3.8 12/19/2022 1024    K 4.2 08/02/2014 1649    CL 102 12/19/2022 1024    CL 109 (H) 08/02/2014 1649    CO2 27 12/19/2022 1024    CO2 21 08/02/2014 1649    BUN 11 12/19/2022 1024     BUN 7 12/24/2020 1144    BUN 11 08/02/2014 1649    CREATININE 0.58 (L) 12/19/2022 1024    CREATININE 0.92 08/02/2014 1649     Labs (Brief)          Component Value Date/Time    CALCIUM 9.2 12/19/2022 1024    CALCIUM 8.6 08/02/2014 1649    ALKPHOS 85 12/19/2022 1024    ALKPHOS 82 08/02/2014 1649    AST 21 12/19/2022 1024    ALT 25 12/19/2022 1024    ALT 132 (H) 08/02/2014 1649    BILITOT 0.3 12/19/2022 1024            RADIOGRAPHIC STUDIES:  Imaging Results  CT Chest W Contrast   Result Date: 01/08/2023 CLINICAL DATA:  Non-small-cell lung cancer. Restaging. * Tracking Code: BO * EXAM: CT CHEST, ABDOMEN, AND PELVIS WITH CONTRAST TECHNIQUE: Multidetector CT imaging of the chest, abdomen and pelvis was performed following the standard protocol during bolus administration of intravenous contrast. RADIATION DOSE REDUCTION: This exam was performed according to the departmental dose-optimization program which includes automated exposure control, adjustment of the mA and/or kV according to patient size and/or use of iterative reconstruction technique. CONTRAST:  59m OMNIPAQUE IOHEXOL 300 MG/ML  SOLN COMPARISON:  11/03/2022 chest CT.  PET-CT 08/04/2022. FINDINGS: CT CHEST FINDINGS Cardiovascular: The heart size is normal. No substantial pericardial effusion. Mild atherosclerotic calcification is noted in the wall of the thoracic aorta. Right Port-A-Cath tip is positioned in the right atrium. Mediastinum/Nodes: No mediastinal lymphadenopathy. There is no hilar lymphadenopathy. The esophagus has normal imaging features. There is no axillary lymphadenopathy. Lungs/Pleura: Centrilobular and paraseptal emphysema evident. Musculoskeletal: The mixed lytic and sclerotic changes in the T3 vertebral body are similar to prior (see axial 27/4 and well demonstrated on sagittal 94/6). This was better evaluated by MRI on 11/25/2022. A T9 lesion seen on that study is not readily evident on CT imaging. No new  worrisome lytic or sclerotic osseous abnormality. Multiple bilateral nonacute rib fractures evident. CT ABDOMEN PELVIS FINDINGS Hepatobiliary: 4 mm hypodensity posterior right liver on 63/2 is stable since CT scan of 03/20/2020 consistent with benign etiology. There is no evidence for gallstones, gallbladder wall thickening, or pericholecystic fluid. No intrahepatic or extrahepatic biliary dilation. Pancreas: No focal mass lesion. No dilatation of the  main duct. No intraparenchymal cyst. No peripancreatic edema. Spleen: No splenomegaly. No focal mass lesion. Adrenals/Urinary Tract: No adrenal nodule or mass. 1-2 mm nonobstructing interpolar right renal stone noted on 64/2. Left kidney unremarkable. No evidence for hydroureter. The urinary bladder appears normal for the degree of distention. Stomach/Bowel: Stomach is distended with food and fluid. Duodenum is normally positioned as is the ligament of Treitz. No small bowel wall thickening. No small bowel dilatation. Neither the terminal ileum nor the appendix are discretely visible. Prominent stool volume noted transverse and left colon to the level of the rectum. Vascular/Lymphatic: There is moderate atherosclerotic calcification of the abdominal aorta without aneurysm. There is no gastrohepatic or hepatoduodenal ligament lymphadenopathy. No retroperitoneal or mesenteric lymphadenopathy. No pelvic sidewall lymphadenopathy. Reproductive: The prostate gland and seminal vesicles are unremarkable. Other: No intraperitoneal free fluid. Musculoskeletal: Status post vertebral augmentation at L2. Metastatic lesion in the L3 vertebral body again noted, better characterized on recent lumbar spine MRI of 11/25/2022. Body wall edema noted in the lower abdomen and pelvis. IMPRESSION: 1. Stable CT chest since 11/03/2022. No evidence for recurrent or metastatic disease in the thorax. 2. No findings to suggest metastatic disease in the abdomen or pelvis on today's study. 3.  Multiple metastatic lesions identified in the thoracolumbar spine, as characterized on MR imaging 11/25/2022. 4. 1-2 mm nonobstructing interpolar right renal stone. 5. Prominent stool volume transverse and left colon to the level of the rectum. Imaging features could be compatible with constipation in the appropriate clinical setting. 6. Aortic Atherosclerosis (ICD10-I70.0) and Emphysema (ICD10-J43.9). Electronically Signed   By: Misty Stanley M.D.   On: 01/08/2023 07:19    CT Abdomen Pelvis W Contrast   Result Date: 01/08/2023 CLINICAL DATA:  Non-small-cell lung cancer. Restaging. * Tracking Code: BO * EXAM: CT CHEST, ABDOMEN, AND PELVIS WITH CONTRAST TECHNIQUE: Multidetector CT imaging of the chest, abdomen and pelvis was performed following the standard protocol during bolus administration of intravenous contrast. RADIATION DOSE REDUCTION: This exam was performed according to the departmental dose-optimization program which includes automated exposure control, adjustment of the mA and/or kV according to patient size and/or use of iterative reconstruction technique. CONTRAST:  56m OMNIPAQUE IOHEXOL 300 MG/ML  SOLN COMPARISON:  11/03/2022 chest CT.  PET-CT 08/04/2022. FINDINGS: CT CHEST FINDINGS Cardiovascular: The heart size is normal. No substantial pericardial effusion. Mild atherosclerotic calcification is noted in the wall of the thoracic aorta. Right Port-A-Cath tip is positioned in the right atrium. Mediastinum/Nodes: No mediastinal lymphadenopathy. There is no hilar lymphadenopathy. The esophagus has normal imaging features. There is no axillary lymphadenopathy. Lungs/Pleura: Centrilobular and paraseptal emphysema evident. Musculoskeletal: The mixed lytic and sclerotic changes in the T3 vertebral body are similar to prior (see axial 27/4 and well demonstrated on sagittal 94/6). This was better evaluated by MRI on 11/25/2022. A T9 lesion seen on that study is not readily evident on CT imaging. No new  worrisome lytic or sclerotic osseous abnormality. Multiple bilateral nonacute rib fractures evident. CT ABDOMEN PELVIS FINDINGS Hepatobiliary: 4 mm hypodensity posterior right liver on 63/2 is stable since CT scan of 03/20/2020 consistent with benign etiology. There is no evidence for gallstones, gallbladder wall thickening, or pericholecystic fluid. No intrahepatic or extrahepatic biliary dilation. Pancreas: No focal mass lesion. No dilatation of the main duct. No intraparenchymal cyst. No peripancreatic edema. Spleen: No splenomegaly. No focal mass lesion. Adrenals/Urinary Tract: No adrenal nodule or mass. 1-2 mm nonobstructing interpolar right renal stone noted on 64/2. Left kidney unremarkable. No evidence for hydroureter.  The urinary bladder appears normal for the degree of distention. Stomach/Bowel: Stomach is distended with food and fluid. Duodenum is normally positioned as is the ligament of Treitz. No small bowel wall thickening. No small bowel dilatation. Neither the terminal ileum nor the appendix are discretely visible. Prominent stool volume noted transverse and left colon to the level of the rectum. Vascular/Lymphatic: There is moderate atherosclerotic calcification of the abdominal aorta without aneurysm. There is no gastrohepatic or hepatoduodenal ligament lymphadenopathy. No retroperitoneal or mesenteric lymphadenopathy. No pelvic sidewall lymphadenopathy. Reproductive: The prostate gland and seminal vesicles are unremarkable. Other: No intraperitoneal free fluid. Musculoskeletal: Status post vertebral augmentation at L2. Metastatic lesion in the L3 vertebral body again noted, better characterized on recent lumbar spine MRI of 11/25/2022. Body wall edema noted in the lower abdomen and pelvis. IMPRESSION: 1. Stable CT chest since 11/03/2022. No evidence for recurrent or metastatic disease in the thorax. 2. No findings to suggest metastatic disease in the abdomen or pelvis on today's study. 3.  Multiple metastatic lesions identified in the thoracolumbar spine, as characterized on MR imaging 11/25/2022. 4. 1-2 mm nonobstructing interpolar right renal stone. 5. Prominent stool volume transverse and left colon to the level of the rectum. Imaging features could be compatible with constipation in the appropriate clinical setting. 6. Aortic Atherosclerosis (ICD10-I70.0) and Emphysema (ICD10-J43.9). Electronically Signed   By: Misty Stanley M.D.   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 He has molecular studies by foundation 1 that showed no actionable mutations and PD-L1 expression was 90%. He underwent palliative radiotherapy to the L2 vertebral body compression fraction as well as solitary brain metastasis with SRS. He is currently undergoing palliative systemic chemotherapy with carboplatin for AUC of 5, Alimta 500 Mg/M2 and Keytruda 200 Mg IV every 3 weeks.  Status post 6 cycles.  Starting from cycle #5 he is on treatment with maintenance Alimta and Keytruda every 3 weeks. The patient has been tolerating this treatment fairly well with no concerning adverse effects. He had repeat CT scan of the chest, abdomen and pelvis performed recently.  I personally and independently reviewed the scan and discussed the results with the patient today. His scan showed no concerning findings for disease progression. I recommended for the patient to continue his current treatment with the maintenance Alimta and Keytruda every 3 weeks. I will see him back for follow-up  visit in 3 weeks for evaluation before the next cycle of his treatment. For the pain management, he is currently on MS Contin 15 mg p.o. twice daily in addition to Oxy IR for breakthrough pain and managed by the palliative care team.  The patient was advised to call immediately if he has any other concerning symptoms in the interval. The patient voices understanding of current disease status and treatment options and is in agreement with the current care plan.   All questions were answered. The patient knows to call the clinic with any problems, questions or concerns. We can certainly see the patient much sooner if necessary.   The total time spent in the appointment was 71  minutes.  Disclaimer: This note was dictated with voice recognition software. Similar sounding words can inadvertently be transcribed and may not be corrected upon review.      Below is a note from palliative medicine Neoplasm related pain Samuel reports pain is well controlled. Tolerating MS Contin and oxycodone for breakthrough pain. He is only taking MS Contin at bedtime given improvement in pain.  Pain tends to be worse later in the evening and during the night. Not requiring breakthrough medication around the clock. He is appropriately taking medications and refills in line with administration schedule.    Patient pain contract was signed at previous visit on 10/23.   2.  Appetite After diet continues to improve.  Current weight 144 pounds up from 137 pounds on 1/29.   PLAN: MS Contin 15 mg at bedtime only  Oxy IR 5-10 mg every 4-6 hours as needed for breakthrough pain.  Is only taken 1-2 times daily. Colace twice daily Narcan for emergency use Appetite much improved.Weight is increasing. Pain contract signed on 10/23. I will plan to see patient back in 3-4 weeks in collaboration with his other oncology appointments.        Outpatient Encounter Medications as of 01/19/2023  Medication Sig   acetaminophen  (TYLENOL) 500 MG tablet Take 1,000 mg by mouth every 6 (six) hours as needed for mild pain or moderate pain.   blood glucose meter kit and supplies KIT Dispense based on patient and insurance preference. Use up to four times daily as directed.   empagliflozin (JARDIANCE) 10 MG TABS tablet Take 1 tablet (10 mg total) by mouth daily before breakfast.   Fingerstix Lancets MISC use as directed   glucose blood test strip Use as instructed   Insulin Pen Needle 32G X 4 MM MISC Use with Insulin pen   morphine (MS CONTIN) 15 MG 12 hr tablet Take 1 tablet (15 mg total) by mouth every 12 (twelve) hours.   oxyCODONE (OXY IR/ROXICODONE) 5 MG immediate release tablet Take 1 - 2 tablets (5 - 10 mg total) by mouth every 4 hours as needed for severe pain.   [DISCONTINUED] folic acid (FOLVITE) 1 MG tablet Take 1 tablet (1 mg total) by mouth daily.   [DISCONTINUED] gabapentin (NEURONTIN) 300 MG capsule Take 2 capsules (600 mg total) by mouth 3 (three) times daily.   [DISCONTINUED] glucose blood test strip use as directed   [DISCONTINUED] insulin aspart protamine - aspart (NOVOLOG MIX 70/30 FLEXPEN) (70-30) 100 UNIT/ML FlexPen Inject 10 Units into the skin 2 (two) times daily with a meal.   [DISCONTINUED] insulin detemir (LEVEMIR FLEXPEN) 100 UNIT/ML FlexPen Inject into the skin.   atorvastatin (LIPITOR) 40 MG tablet Take 1 tablet (40 mg total) by mouth daily.   folic acid (FOLVITE) 1 MG tablet Take 1 tablet (1 mg total) by mouth daily.   gabapentin (NEURONTIN) 300 MG capsule Take 2 capsules (600 mg total) by mouth 3 (three) times daily.   insulin aspart protamine - aspart (NOVOLOG MIX 70/30 FLEXPEN) (70-30) 100 UNIT/ML FlexPen Inject 25 Units into the skin 2 (two) times daily with a meal.   ipratropium-albuterol (DUONEB) 0.5-2.5 (3) MG/3ML SOLN Take 3 mLs by nebulization every 6 (six) hours as needed (Shortness of breath).   lidocaine-prilocaine (EMLA) cream Apply to the Port-A-Cath site 30-60 minutes before chemo.  (Patient not taking: Reported on 01/19/2023)   prochlorperazine (COMPAZINE) 10 MG tablet Take 1 tablet (10 mg total) by mouth every 6 (six) hours as needed for  nausea or vomiting. (Patient not taking: Reported on 01/19/2023)   [DISCONTINUED] atorvastatin (LIPITOR) 40 MG tablet Take 1 tablet (40 mg total) by mouth daily. (Patient not taking: Reported on 01/19/2023)   [DISCONTINUED] Insulin Lispro Prot & Lispro (HUMALOG 75/25 MIX) (75-25) 100 UNIT/ML Kwikpen Inject 10 units into the skin 2 times daily with a meal (Patient not taking: Reported on 01/19/2023)   [DISCONTINUED] ipratropium-albuterol (DUONEB) 0.5-2.5 (3) MG/3ML SOLN Take 3 mLs by nebulization every 6 (six) hours as needed (Shortness of breath). (Patient not taking: Reported on 01/19/2023)   No facility-administered encounter medications on file as of 01/19/2023.    Past Medical History:  Diagnosis Date   Alcohol use disorder, severe, dependence ([place]) 12/26/2016   Diabetes mellitus without complication ([place])    Metastatic cancer to brain ([place]) 08/05/2022   Metastatic cancer to spine ([place]) 07/19/2022   Pancreatitis unk   Primary lung adenocarcinoma ([place]) 08/05/2022   Suicidal ideation    Type 2 diabetes mellitus with hyperglycemia, without long-term current use of insulin ([place]) 06/13/2022   Formatting of this note might be different from the original. 06/13/2022 A1C 13.8, FSBG 414 Start empagliflozin '5mg'$ /metformin 1000 daily, levemir 20 qhs. (Samples given) Will apply for medassist for pharmacy    Past Surgical History:  Procedure Laterality Date   IR BONE TUMOR(S)RF ABLATION  08/01/2022   IR IMAGING GUIDED PORT INSERTION  09/21/2022   IR KYPHO LUMBAR INC FX REDUCE BONE BX UNI/BIL CANNULATION INC/IMAGING  08/01/2022    History reviewed. No pertinent family history.  Social History   Socioeconomic History   Marital status: Single    Spouse name: Not on file   Number of children: Not on file   Years of education: Not on file    Highest education level: Not on file  Occupational History   Not on file  Tobacco Use   Smoking status: Every Day    Packs/day: 1.00    Types: Cigarettes    Last attempt to quit: 08/17/2022    Years since quitting: 0.4   Smokeless tobacco: Never  Vaping Use   Vaping Use: Never used  Substance and Sexual Activity   Alcohol use: Yes    Alcohol/week: 50.0 standard drinks of alcohol    Types: 50 Cans of beer per week    Comment: last thursday - a couple of beers   Drug use: No   Sexual activity: Yes  Other Topics Concern   Not on file  Social History Narrative   Not on file   Social Determinants of Health   Financial Resource Strain: Not on file  Food Insecurity: No Food Insecurity (12/19/2022)   Hunger Vital Sign    Worried About Running Out of Food in the Last Year: Never true    Ran Out of Food in the Last Year: Never true  Transportation Needs: Not on file  Physical Activity: Not on file  Stress: Not on file  Social Connections: Not on file  Intimate Partner Violence: Not on file    Review of Systems  Constitutional:  Positive for malaise/fatigue. Negative for chills, diaphoresis, fever and weight loss.  HENT:  Negative for congestion, hearing loss, nosebleeds, sore throat and tinnitus.   Eyes:  Negative for blurred vision, photophobia and redness.  Respiratory:  Positive for shortness of breath. Negative for cough, hemoptysis, sputum production, wheezing and stridor.   Cardiovascular:  Negative for chest pain, palpitations, orthopnea, claudication, leg swelling and PND.  Gastrointestinal:  Negative for abdominal pain, blood  in stool, constipation, diarrhea, heartburn, nausea and vomiting.  Genitourinary:  Negative for dysuria, flank pain, frequency, hematuria and urgency.  Musculoskeletal:  Positive for back pain and neck pain. Negative for falls, joint pain and myalgias.  Skin:  Negative for itching and rash.  Neurological:  Negative for dizziness, tingling, tremors,  sensory change, speech change, focal weakness, seizures, loss of consciousness, weakness and headaches.  Endo/Heme/Allergies:  Negative for environmental allergies and polydipsia. Does not bruise/bleed easily.  Psychiatric/Behavioral:  Negative for depression, memory loss, substance abuse and suicidal ideas. The patient is not nervous/anxious and does not have insomnia.         Objective    BP 110/70   Pulse 89   SpO2 100%   Physical Exam Vitals reviewed.  Constitutional:      Appearance: Normal appearance. He is well-developed. He is not diaphoretic.  HENT:     Head: Normocephalic and atraumatic.     Nose: No nasal deformity, septal deviation, mucosal edema or rhinorrhea.     Right Sinus: No maxillary sinus tenderness or frontal sinus tenderness.     Left Sinus: No maxillary sinus tenderness or frontal sinus tenderness.     Mouth/Throat:     Pharynx: No oropharyngeal exudate.     Comments: Poor dentition Eyes:     General: No scleral icterus.    Conjunctiva/sclera: Conjunctivae normal.     Pupils: Pupils are equal, round, and reactive to light.  Neck:     Thyroid: No thyromegaly.     Vascular: No carotid bruit or JVD.     Trachea: Trachea normal. No tracheal tenderness or tracheal deviation.  Cardiovascular:     Rate and Rhythm: Normal rate and regular rhythm.     Chest Wall: PMI is not displaced.     Pulses: Normal pulses. No decreased pulses.     Heart sounds: Normal heart sounds, S1 normal and S2 normal. Heart sounds not distant. No murmur heard.    No systolic murmur is present.     No diastolic murmur is present.     No friction rub. No gallop. No S3 or S4 sounds.  Pulmonary:     Effort: No tachypnea, accessory muscle usage or respiratory distress.     Breath sounds: No stridor. No decreased breath sounds, wheezing, rhonchi or rales.  Chest:     Chest wall: No tenderness.  Abdominal:     General: Bowel sounds are normal. There is no distension.     Palpations:  Abdomen is soft. Abdomen is not rigid.     Tenderness: There is no abdominal tenderness. There is no guarding or rebound.  Musculoskeletal:        General: Tenderness present. Normal range of motion.     Cervical back: Normal range of motion and neck supple. No edema, erythema or rigidity. No muscular tenderness. Normal range of motion.     Right lower leg: Edema present.     Left lower leg: Edema present.     Comments: Foot exam normal  Lymphadenopathy:     Head:     Right side of head: No submental or submandibular adenopathy.     Left side of head: No submental or submandibular adenopathy.     Cervical: No cervical adenopathy.  Skin:    General: Skin is warm and dry.     Coloration: Skin is not pale.     Findings: No rash.     Nails: There is no clubbing.  Neurological:  Mental Status: He is alert and oriented to person, place, and time.     Sensory: No sensory deficit.  Psychiatric:        Speech: Speech normal.        Behavior: Behavior normal.         Assessment & Plan:   Problem List Items Addressed This Visit       Respiratory   Primary lung adenocarcinoma ([place])    Management per oncology      Relevant Medications   folic acid (FOLVITE) 1 MG tablet   gabapentin (NEURONTIN) 300 MG capsule     Digestive   Dental caries    Patient given list of Medicaid dentists        Endocrine   Diabetes ([place])    Type 2 diabetes not well-controlled plan to increase NovoLog 7030 to 25 units twice daily and begin Jardiance 10 mg daily he will follow-up with clinical pharmacy and getting urine for albumin and lipid panel this visit      Relevant Medications   atorvastatin (LIPITOR) 40 MG tablet   insulin aspart protamine - aspart (NOVOLOG MIX 70/30 FLEXPEN) (70-30) 100 UNIT/ML FlexPen   empagliflozin (JARDIANCE) 10 MG TABS tablet   Microalbuminuria due to type 2 diabetes mellitus (HCC)    Reassess urine for albumin      Relevant Medications   atorvastatin  (LIPITOR) 40 MG tablet   insulin aspart protamine - aspart (NOVOLOG MIX 70/30 FLEXPEN) (70-30) 100 UNIT/ML FlexPen   empagliflozin (JARDIANCE) 10 MG TABS tablet   Other Relevant Orders   Microalbumin / creatinine urine ratio   DM type 2 with diabetic peripheral neuropathy (HCC) - Primary    Continue with gabapentin      Relevant Medications   gabapentin (NEURONTIN) 300 MG capsule   atorvastatin (LIPITOR) 40 MG tablet   insulin aspart protamine - aspart (NOVOLOG MIX 70/30 FLEXPEN) (70-30) 100 UNIT/ML FlexPen   empagliflozin (JARDIANCE) 10 MG TABS tablet   Other Relevant Orders   Ambulatory referral to Ophthalmology   Microalbumin / creatinine urine ratio     Musculoskeletal and Integument   Metastatic cancer to spine [facility])    Getting palliative medicine pain management      Relevant Medications   folic acid (FOLVITE) 1 MG tablet   gabapentin (NEURONTIN) 300 MG capsule     Other   Tobacco dependence    Still smoking    Current smoking consumption amount: Half a pack a day  Dicsussion on advise to quit smoking and smoking impacts: Cardiovascular lung cancer impacts  Patient's willingness to quit: Motivated to quit  Methods to quit smoking discussed: Behavioral modification  Medication management of smoking session drugs discussed: Nicotine patch  Resources provided:  AVS   Setting quit date not established  Follow-up arranged 2 months   Time spent counseling the patient: 5 minutes       Hyperlipidemia    Reassess lipids continue statin      Relevant Medications   atorvastatin (LIPITOR) 40 MG tablet   Other Relevant Orders   Lipid panel   Vitamin D deficiency    Continue vitamin D supplement the counter      Major depressive disorder, severe (HCC)    Currently stable      FTT (failure to thrive) in adult    Has gained some weight      Neuropathic pain    Continue gabapentin      Relevant Medications   gabapentin (NEURONTIN) 300  MG capsule    Palliative care patient    Per palliative medicine      Relevant Medications   gabapentin (NEURONTIN) 300 MG capsule   Neoplasm related pain   Relevant Medications   gabapentin (NEURONTIN) 300 MG capsule   RESOLVED: Suicidal ideation    Not active     1 hour spent prolonged time due to multiple records review's high degree of complexity Referral sent to optometry for eye exam Return in about 2 months (around 03/20/2023) for diabetes, copd.   Asencion Noble, MD

## 2023-01-19 NOTE — Assessment & Plan Note (Signed)
Reassess lipids continue statin

## 2023-01-19 NOTE — Assessment & Plan Note (Signed)
Getting palliative medicine pain management

## 2023-01-19 NOTE — Assessment & Plan Note (Signed)
Continue with gabapentin

## 2023-01-19 NOTE — Progress Notes (Signed)
Feet swelling

## 2023-01-19 NOTE — Assessment & Plan Note (Signed)
Continue vitamin D supplement the counter

## 2023-01-19 NOTE — Assessment & Plan Note (Signed)
Patient given list of Medicaid dentists

## 2023-01-19 NOTE — Assessment & Plan Note (Signed)
Reassess urine for albumin

## 2023-01-19 NOTE — Assessment & Plan Note (Signed)
Still smoking    Current smoking consumption amount: Half a pack a day  Dicsussion on advise to quit smoking and smoking impacts: Cardiovascular lung cancer impacts  Patient's willingness to quit: Motivated to quit  Methods to quit smoking discussed: Behavioral modification  Medication management of smoking session drugs discussed: Nicotine patch  Resources provided:  AVS   Setting quit date not established  Follow-up arranged 2 months   Time spent counseling the patient: 5 minutes

## 2023-01-19 NOTE — Assessment & Plan Note (Signed)
Per palliative medicine

## 2023-01-19 NOTE — Assessment & Plan Note (Signed)
Not active

## 2023-01-19 NOTE — Assessment & Plan Note (Signed)
Type 2 diabetes not well-controlled plan to increase NovoLog 7030 to 25 units twice daily and begin Jardiance 10 mg daily he will follow-up with clinical pharmacy and getting urine for albumin and lipid panel this visit

## 2023-01-19 NOTE — Patient Instructions (Signed)
Increase insulin to 25 units twice daily Start Jardiance 10 mg daily These medications all other medications were refilled and sent to [facility] atorvastatin daily Referral to the [facility] was made and I gave you a list of dentists that take Medicaid See Lurena Joiner for clinical pharmacist in 3 weeks for your blood sugar Labs will be obtained today and a urine for protein Return to Dr. Joya Gaskins 2 months

## 2023-01-21 LAB — LIPID PANEL
Chol/HDL Ratio: 2.9 ratio (ref 0.0–5.0)
Cholesterol, Total: 116 mg/dL (ref 100–199)
HDL: 40 mg/dL (ref >39)
LDL Chol Calc (NIH): 61 mg/dL (ref 0–99)
Triglycerides: 76 mg/dL (ref 0–149)
VLDL Cholesterol Cal: 15 mg/dL (ref 5–40)

## 2023-01-21 LAB — MICROALBUMIN / CREATININE URINE RATIO
Creatinine, Urine: 86.1 mg/dL
Microalb/Creat Ratio: 16 mg/g creat (ref 0–29)
Microalbumin, Urine: 13.9 ug/mL

## 2023-01-22 ENCOUNTER — Other Ambulatory Visit: Payer: Self-pay | Admitting: Nurse Practitioner

## 2023-01-22 DIAGNOSIS — C7951 Secondary malignant neoplasm of bone: Secondary | ICD-10-CM

## 2023-01-22 DIAGNOSIS — C3490 Malignant neoplasm of unspecified part of unspecified bronchus or lung: Secondary | ICD-10-CM

## 2023-01-22 DIAGNOSIS — Z515 Encounter for palliative care: Secondary | ICD-10-CM

## 2023-01-22 DIAGNOSIS — G893 Neoplasm related pain (acute) (chronic): Secondary | ICD-10-CM

## 2023-01-22 DIAGNOSIS — M792 Neuralgia and neuritis, unspecified: Secondary | ICD-10-CM

## 2023-01-22 NOTE — Progress Notes (Signed)
See result note.  

## 2023-01-22 NOTE — Progress Notes (Signed)
Let pt know urine normal no kidney damage from diabetes Cholesterol normal

## 2023-01-23 ENCOUNTER — Other Ambulatory Visit (HOSPITAL_COMMUNITY): Payer: Self-pay

## 2023-01-23 MED ORDER — OXYCODONE HCL 5 MG PO TABS
5.0000 mg | ORAL_TABLET | ORAL | 0 refills | Status: DC | PRN
  Filled 2023-01-23: qty 120, 10d supply, fill #0

## 2023-01-23 MED ORDER — MORPHINE SULFATE ER 15 MG PO TBCR
15.0000 mg | EXTENDED_RELEASE_TABLET | Freq: Two times a day (BID) | ORAL | 0 refills | Status: DC
  Filled 2023-01-23: qty 60, 30d supply, fill #0

## 2023-01-25 ENCOUNTER — Telehealth: Payer: Self-pay

## 2023-01-25 NOTE — Telephone Encounter (Signed)
-----   Message from Elsie Stain, MD sent at 01/22/2023  3:22 PM EST ----- See result note

## 2023-01-25 NOTE — Telephone Encounter (Signed)
Called and patient was already made aware of lab results

## 2023-01-26 ENCOUNTER — Other Ambulatory Visit: Payer: Self-pay

## 2023-01-27 ENCOUNTER — Other Ambulatory Visit: Payer: Self-pay | Admitting: Pharmacist

## 2023-01-27 DIAGNOSIS — Z794 Long term (current) use of insulin: Secondary | ICD-10-CM

## 2023-01-27 MED ORDER — ACCU-CHEK GUIDE VI STRP: ORAL_STRIP | 6 refills | Status: DC

## 2023-01-27 MED ORDER — ACCU-CHEK GUIDE W/DEVICE KIT: PACK | 0 refills | Status: DC

## 2023-01-27 MED ORDER — ACCU-CHEK SOFTCLIX LANCETS MISC: 6 refills | Status: DC

## 2023-01-27 NOTE — Progress Notes (Unsigned)
[place]  [phone] [phone]   Name: [name] Date: 01/27/2023 MRN: [id number]  DOB: March 07, 1966  Patient Care Team: Elsie Stain, MD as PCP - General (Pulmonary Disease) Camillia Herter, NP as Nurse Practitioner (Nurse Practitioner) Debera Lat, Carlena Sax, NP as Nurse Practitioner (Nurse Practitioner)    INTERVAL HISTORY: Samuel [name] is a 57 y.o. 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  Palliative ask to see for symptom management and goals of care.   SOCIAL HISTORY:     reports that he has been smoking cigarettes. He has been smoking an average of 1 pack per day. He has never used smokeless tobacco. He reports current alcohol use of about 50.0 standard drinks of alcohol per week. He reports that he does not use drugs.  ADVANCE DIRECTIVES:  Patient does not have an advanced directives. Education and packet provided. He has expressed interest in completing. States his sister, Samuel [name] would be his designated Scientist, research (medical).   CODE STATUS: Full code  PAST MEDICAL HISTORY: Past Medical History:  Diagnosis Date   Alcohol use disorder, severe, dependence ([place]) 12/26/2016   Diabetes mellitus without complication ([place])    Metastatic cancer to brain ([place]) 08/05/2022   Metastatic cancer to spine ([place]) 07/19/2022   Pancreatitis unk   Primary lung adenocarcinoma ([place]) 08/05/2022   Suicidal ideation    Type 2 diabetes mellitus with hyperglycemia, without long-term current use of insulin ([place]) 06/13/2022   Formatting of this note might be different from the original. 06/13/2022 A1C 13.8, FSBG 414 Start empagliflozin '5mg'$ /metformin 1000 daily, levemir 20 qhs. (Samples given) Will apply for medassist for pharmacy    ALLERGIES:  is allergic to penicillins and  percocet [oxycodone-acetaminophen].  MEDICATIONS:  Current Outpatient Medications  Medication Sig Dispense Refill   Accu-Chek Softclix Lancets lancets Use to check blood sugar 3 times daily. E11.69 100 each 6   Blood Glucose Monitoring Suppl (ACCU-CHEK GUIDE) w/Device KIT Use to check blood sugar 3 times daily. E11.69 1 kit 0   glucose blood (ACCU-CHEK GUIDE) test strip Use to check blood sugar 3 times daily. E11.69 100 each 6   acetaminophen (TYLENOL) 500 MG tablet Take 1,000 mg by mouth every 6 (six) hours as needed for mild pain or moderate pain.     atorvastatin (LIPITOR) 40 MG tablet Take 1 tablet (40 mg total) by mouth daily. 30 tablet 2   blood glucose meter kit and supplies KIT Dispense based on patient and insurance preference. Use up to four times daily as directed. 1 each 0   empagliflozin (JARDIANCE) 10 MG TABS tablet Take 1 tablet (10 mg total) by mouth daily before breakfast. 60 tablet 4   folic acid (FOLVITE) 1 MG tablet Take 1 tablet (1 mg total) by mouth daily. 30 tablet 4   gabapentin (NEURONTIN) 300 MG capsule Take 2 capsules (600 mg total) by mouth 3 (three) times daily. 180 capsule 1   insulin aspart protamine - aspart (NOVOLOG MIX 70/30 FLEXPEN) (70-30) 100 UNIT/ML FlexPen Inject 25 Units into the skin 2 (two) times daily with a meal. 15 mL 5   Insulin Pen Needle 32G X 4 MM MISC Use with Insulin pen 100 each 3   ipratropium-albuterol (DUONEB) 0.5-2.5 (3) MG/3ML SOLN Take 3 mLs by nebulization every 6 (six) hours as needed (Shortness of breath).  360 mL 0   lidocaine-prilocaine (EMLA) cream Apply to the Port-A-Cath site 30-60 minutes before chemo. (Patient not taking: Reported on 01/19/2023) 30 g 0   morphine (MS CONTIN) 15 MG 12 hr tablet Take 1 tablet (15 mg total) by mouth every 12 (twelve) hours. 60 tablet 0   oxyCODONE (OXY IR/ROXICODONE) 5 MG immediate release tablet Take 1 - 2 tablets (5 - 10 mg total) by mouth every 4 hours as needed for severe pain. 120 tablet 0    prochlorperazine (COMPAZINE) 10 MG tablet Take 1 tablet (10 mg total) by mouth every 6 (six) hours as needed for nausea or vomiting. (Patient not taking: Reported on 01/19/2023) 30 tablet 0   No current facility-administered medications for this visit.    VITAL SIGNS: There were no vitals taken for this visit. There were no vitals filed for this visit.  Estimated body mass index is 21.9 kg/m as calculated from the following:   Height as of 11/07/22: '5\' 8"'$  (1.727 m).   Weight as of 01/09/23: 144 lb (65.3 kg).   PERFORMANCE STATUS (ECOG) : 1 - Symptomatic but completely ambulatory   Physical Exam General: NAD Cardiovascular: regular rate and rhythm Pulmonary:normal breathing pattern  Extremities: no edema, no joint deformities, brace in place  Skin: no rashes Neurological: AAO x3  IMPRESSION: [name] presents to clinic today for symptom management follow-up.  No family present.  No acute distress noted.  Patient is ambulatory in brace.  Reports overall he is doing well.  Denies nausea, vomiting, constipation, or diarrhea.  Is remaining as active as possible.  Neoplasm related pain Arnel reports pain has somewhat increased however he is managing.  States pain continues to intensify later in the evening and during the night and most recently sometimes first thing in the morning.  We reviewed his current regimen.  [name] is taking oxycodone 5-10 mg every 4 hours as needed for breakthrough pain and MS Contin 15 mg every 12 hours.  Tolerating medications as prescribed.  He is taking responsibly with where appropriate refill request.  Does not require breakthrough medication around-the-clock.  Will continue to closely monitor and adjust as needed.  Pain contract on file.    2.  Appetite Appetite is much improved.  He is able to eat foods that he enjoys.  Weight has increased 245 pounds up from 137 pounds on 1/29.  PLAN: MS Contin 15 mg at bedtime only  Oxy IR 5-10 mg every 4-6 hours  as needed for breakthrough pain.  Is only taken 1-2 times daily. Colace twice daily Narcan for emergency use Appetite much improved.Weight is increasing. Pain contract signed on 10/23. I will plan to see patient back in 3-4 weeks in collaboration with his other oncology appointments.   Patient expressed understanding and was in agreement with this plan. He also understands that He can call the clinic at any time with any questions, concerns, or complaints.       Any controlled substances utilized were prescribed in the context of palliative care. PDMP has been reviewed.    Time Total: 30 min   Visit consisted of counseling and education dealing with the complex and emotionally intense issues of symptom management and palliative care in the setting of serious and potentially life-threatening illness.Greater than 50%  of this time was spent counseling and coordinating care related to the above assessment and plan.  Alda Lea, AGPCNP-BC  Palliative Medicine Team/[facility]

## 2023-01-30 ENCOUNTER — Inpatient Hospital Stay: Payer: Medicaid Other | Attending: Adult Health

## 2023-01-30 ENCOUNTER — Inpatient Hospital Stay (HOSPITAL_BASED_OUTPATIENT_CLINIC_OR_DEPARTMENT_OTHER): Payer: Medicaid Other | Admitting: Internal Medicine

## 2023-01-30 ENCOUNTER — Inpatient Hospital Stay (HOSPITAL_BASED_OUTPATIENT_CLINIC_OR_DEPARTMENT_OTHER): Payer: Medicaid Other | Admitting: Nurse Practitioner

## 2023-01-30 ENCOUNTER — Other Ambulatory Visit: Payer: Medicaid Other

## 2023-01-30 ENCOUNTER — Encounter: Payer: Self-pay | Admitting: Nurse Practitioner

## 2023-01-30 ENCOUNTER — Other Ambulatory Visit: Payer: Self-pay

## 2023-01-30 ENCOUNTER — Inpatient Hospital Stay: Payer: Medicaid Other

## 2023-01-30 VITALS — BP 135/85 | HR 90 | Temp 97.9°F | Resp 17

## 2023-01-30 VITALS — BP 117/80 | HR 98 | Temp 98.3°F | Resp 15 | Wt 145.3 lb

## 2023-01-30 DIAGNOSIS — G893 Neoplasm related pain (acute) (chronic): Secondary | ICD-10-CM | POA: Diagnosis not present

## 2023-01-30 DIAGNOSIS — Z7984 Long term (current) use of oral hypoglycemic drugs: Secondary | ICD-10-CM | POA: Insufficient documentation

## 2023-01-30 DIAGNOSIS — M792 Neuralgia and neuritis, unspecified: Secondary | ICD-10-CM

## 2023-01-30 DIAGNOSIS — C3490 Malignant neoplasm of unspecified part of unspecified bronchus or lung: Secondary | ICD-10-CM

## 2023-01-30 DIAGNOSIS — J910 Malignant pleural effusion: Secondary | ICD-10-CM | POA: Diagnosis not present

## 2023-01-30 DIAGNOSIS — E119 Type 2 diabetes mellitus without complications: Secondary | ICD-10-CM | POA: Insufficient documentation

## 2023-01-30 DIAGNOSIS — Z5112 Encounter for antineoplastic immunotherapy: Secondary | ICD-10-CM | POA: Insufficient documentation

## 2023-01-30 DIAGNOSIS — J432 Centrilobular emphysema: Secondary | ICD-10-CM | POA: Insufficient documentation

## 2023-01-30 DIAGNOSIS — C3411 Malignant neoplasm of upper lobe, right bronchus or lung: Secondary | ICD-10-CM | POA: Diagnosis not present

## 2023-01-30 DIAGNOSIS — C7931 Secondary malignant neoplasm of brain: Secondary | ICD-10-CM | POA: Diagnosis not present

## 2023-01-30 DIAGNOSIS — I700 Atherosclerosis of aorta: Secondary | ICD-10-CM | POA: Diagnosis not present

## 2023-01-30 DIAGNOSIS — Z515 Encounter for palliative care: Secondary | ICD-10-CM

## 2023-01-30 DIAGNOSIS — C7951 Secondary malignant neoplasm of bone: Secondary | ICD-10-CM | POA: Insufficient documentation

## 2023-01-30 DIAGNOSIS — R630 Anorexia: Secondary | ICD-10-CM

## 2023-01-30 DIAGNOSIS — Z79899 Other long term (current) drug therapy: Secondary | ICD-10-CM | POA: Diagnosis not present

## 2023-01-30 LAB — CMP (CANCER CENTER ONLY)
ALT: 28 U/L (ref 0–44)
AST: 22 U/L (ref 15–41)
Albumin: 3.7 g/dL (ref 3.5–5.0)
Alkaline Phosphatase: 92 U/L (ref 38–126)
Anion gap: 6 (ref 5–15)
BUN: 10 mg/dL (ref 6–20)
CO2: 27 mmol/L (ref 22–32)
Calcium: 9.1 mg/dL (ref 8.9–10.3)
Chloride: 102 mmol/L (ref 98–111)
Creatinine: 0.58 mg/dL — ABNORMAL LOW (ref 0.61–1.24)
GFR, Estimated: >60 mL/min
Glucose, Bld: 369 mg/dL — ABNORMAL HIGH (ref 70–99)
Potassium: 3.6 mmol/L (ref 3.5–5.1)
Sodium: 135 mmol/L (ref 135–145)
Total Bilirubin: 0.4 mg/dL (ref 0.3–1.2)
Total Protein: 7 g/dL (ref 6.5–8.1)

## 2023-01-30 LAB — CBC WITH DIFFERENTIAL (CANCER CENTER ONLY)
Abs Immature Granulocytes: 0.02 10*3/uL (ref 0.00–0.07)
Basophils Absolute: 0 10*3/uL (ref 0.0–0.1)
Basophils Relative: 0 %
Eosinophils Absolute: 0 10*3/uL (ref 0.0–0.5)
Eosinophils Relative: 1 %
HCT: 37.6 % — ABNORMAL LOW (ref 39.0–52.0)
Hemoglobin: 13 g/dL (ref 13.0–17.0)
Immature Granulocytes: 0 %
Lymphocytes Relative: 17 %
Lymphs Abs: 0.9 10*3/uL (ref 0.7–4.0)
MCH: 33.9 pg (ref 26.0–34.0)
MCHC: 34.6 g/dL (ref 30.0–36.0)
MCV: 98.2 fL (ref 80.0–100.0)
Monocytes Absolute: 0.5 10*3/uL (ref 0.1–1.0)
Monocytes Relative: 9 %
Neutro Abs: 3.9 10*3/uL (ref 1.7–7.7)
Neutrophils Relative %: 73 %
Platelet Count: 220 10*3/uL (ref 150–400)
RBC: 3.83 MIL/uL — ABNORMAL LOW (ref 4.22–5.81)
RDW: 12 % (ref 11.5–15.5)
WBC Count: 5.4 10*3/uL (ref 4.0–10.5)
nRBC: 0 % (ref 0.0–0.2)

## 2023-01-30 MED ORDER — SODIUM CHLORIDE 0.9% FLUSH
10.0000 mL | Freq: Once | INTRAVENOUS | Status: AC
  Administered 2023-01-30: 10 mL

## 2023-01-30 MED ORDER — PROCHLORPERAZINE MALEATE 10 MG PO TABS
10.0000 mg | ORAL_TABLET | Freq: Once | ORAL | Status: AC
  Administered 2023-01-30: 10 mg via ORAL
  Filled 2023-01-30: qty 1

## 2023-01-30 MED ORDER — SODIUM CHLORIDE 0.9% FLUSH
10.0000 mL | INTRAVENOUS | Status: DC | PRN
  Administered 2023-01-30: 10 mL

## 2023-01-30 MED ORDER — SODIUM CHLORIDE 0.9 % IV SOLN
200.0000 mg | Freq: Once | INTRAVENOUS | Status: AC
  Administered 2023-01-30: 200 mg via INTRAVENOUS
  Filled 2023-01-30: qty 8

## 2023-01-30 MED ORDER — HEPARIN SOD (PORK) LOCK FLUSH 100 UNIT/ML IV SOLN
500.0000 [IU] | Freq: Once | INTRAVENOUS | Status: AC | PRN
  Administered 2023-01-30: 500 [IU]

## 2023-01-30 MED ORDER — SODIUM CHLORIDE 0.9 % IV SOLN: Freq: Once | INTRAVENOUS | Status: AC

## 2023-01-30 MED ORDER — SODIUM CHLORIDE 0.9 % IV SOLN
500.0000 mg/m2 | Freq: Once | INTRAVENOUS | Status: AC
  Administered 2023-01-30: 800 mg via INTRAVENOUS
  Filled 2023-01-30: qty 20

## 2023-01-30 NOTE — Progress Notes (Signed)
[place] Telephone:([phone]) [phone]   [phone]  OFFICE PROGRESS NOTE  Elsie Stain, MD 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  Biomarker Findings Microsatellite status - MS-Stable Tumor Mutational Burden - 8 Muts/Mb Genomic Findings For a complete list of the genes assayed, please refer to the Appendix. KEAP1 Q227* CDKN2A/B p16INK4a G89V PRKCI amplification TERC amplification - equivocal? TP53 D29f*18 8 Disease relevant genes with no reportable alterations: ALK, BRAF, EGFR, ERBB2, KRAS, MET, RET, ROS1  PDL1 TPS  90%  PRIOR THERAPY:  1) Status post palliative radiotherapy to the L2 lesion under the care of Dr. MTammi Klippel 2) Status post SRS to solitary brain metastasis under the care of Dr. MTammi Klippel CURRENT THERAPY: Palliative systemic therapy with carboplatin for AUC of 5, Alimta 500 Mg/M2 and Keytruda 200 Mg IV every 3 weeks.  First dose September 05, 2022.  Status post 7 cycles.  Starting from cycle #5 the patient will be on maintenance treatment with Alimta and Keytruda every 3 weeks.  INTERVAL HISTORY: Samuel [name] 57y.o. male returns to the clinic today for follow-up visit.  The patient is feeling fine today with no concerning complaints except for the back pain from the bone metastasis.  He is currently being monitored closely by Dr. MTammi Klippel  He denied having any current chest pain, shortness of breath, cough or hemoptysis.  He has no nausea, vomiting, diarrhea or constipation.  He has no headache or visual changes.  He has no weight loss or night  sweats.  He has no fever or chills.  He is here today for evaluation before starting cycle #8 of his treatment.   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 06/13/2022 A1C 13.8, FSBG 414 Start empagliflozin '5mg'$ /metformin 1000 daily, levemir 20 qhs. (Samples given) Will apply for medassist for pharmacy    ALLERGIES:  is allergic to penicillins and percocet [oxycodone-acetaminophen].  MEDICATIONS:  Current Outpatient Medications  Medication Sig Dispense Refill   Accu-Chek Softclix Lancets lancets Use to check blood sugar 3 times daily. E11.69 100 each 6   Blood Glucose Monitoring Suppl (ACCU-CHEK GUIDE) w/Device KIT Use to check blood sugar 3 times daily. E11.69 1 kit 0   glucose blood (ACCU-CHEK GUIDE) test strip Use to check blood sugar 3 times daily. E11.69 100 each 6   acetaminophen (TYLENOL) 500 MG tablet Take 1,000 mg by mouth every 6 (six) hours as needed for mild pain or moderate pain.     atorvastatin (LIPITOR) 40 MG tablet Take 1 tablet (40 mg total) by mouth daily. 30 tablet 2   blood glucose meter kit and supplies KIT Dispense based on patient and insurance preference. Use  up to four times daily as directed. 1 each 0   empagliflozin (JARDIANCE) 10 MG TABS tablet Take 1 tablet (10 mg total) by mouth daily before breakfast. 60 tablet 4   folic acid (FOLVITE) 1 MG tablet Take 1 tablet (1 mg total) by mouth daily. 30 tablet 4   gabapentin (NEURONTIN) 300 MG capsule Take 2 capsules (600 mg total) by mouth 3 (three) times  daily. 180 capsule 1   insulin aspart protamine - aspart (NOVOLOG MIX 70/30 FLEXPEN) (70-30) 100 UNIT/ML FlexPen Inject 25 Units into the skin 2 (two) times daily with a meal. 15 mL 5   Insulin Pen Needle 32G X 4 MM MISC Use with Insulin pen 100 each 3   ipratropium-albuterol (DUONEB) 0.5-2.5 (3) MG/3ML SOLN Take 3 mLs by nebulization every 6 (six) hours as needed (Shortness of breath). 360 mL 0   lidocaine-prilocaine (EMLA) cream Apply to the Port-A-Cath site 30-60 minutes before chemo. (Patient not taking: Reported on 01/19/2023) 30 g 0   morphine (MS CONTIN) 15 MG 12 hr tablet Take 1 tablet (15 mg total) by mouth every 12 (twelve) hours. 60 tablet 0   oxyCODONE (OXY IR/ROXICODONE) 5 MG immediate release tablet Take 1 - 2 tablets (5 - 10 mg total) by mouth every 4 hours as needed for severe pain. 120 tablet 0   prochlorperazine (COMPAZINE) 10 MG tablet Take 1 tablet (10 mg total) by mouth every 6 (six) hours as needed for nausea or vomiting. (Patient not taking: Reported on 01/19/2023) 30 tablet 0   No current facility-administered medications for this visit.    SURGICAL HISTORY:  Past Surgical History:  Procedure Laterality Date   IR BONE TUMOR(S)RF ABLATION  08/01/2022   IR IMAGING GUIDED PORT INSERTION  09/21/2022   IR KYPHO LUMBAR INC FX REDUCE BONE BX UNI/BIL CANNULATION INC/IMAGING  08/01/2022    REVIEW OF SYSTEMS:  A comprehensive review of systems was negative except for: Constitutional: positive for fatigue Musculoskeletal: positive for back pain   PHYSICAL EXAMINATION: General appearance: alert, cooperative, fatigued, and no distress Head: Normocephalic, without obvious abnormality, atraumatic Neck: no adenopathy, no JVD, supple, symmetrical, trachea midline, and thyroid not enlarged, symmetric, no tenderness/mass/nodules Lymph nodes: Cervical, supraclavicular, and axillary nodes normal. Resp: clear to auscultation bilaterally Back: symmetric, no curvature. ROM normal. No CVA  tenderness. Cardio: regular rate and rhythm, S1, S2 normal, no murmur, click, rub or gallop GI: soft, non-tender; bowel sounds normal; no masses,  no organomegaly Extremities: extremities normal, atraumatic, no cyanosis or edema  ECOG PERFORMANCE STATUS: 1 - Symptomatic but completely ambulatory  Blood pressure 117/80, pulse 98, temperature 98.3 F (36.8 C), temperature source Oral, resp. rate 15, weight 145 lb 4.8 oz (65.9 kg), SpO2 100 %.  LABORATORY DATA: Lab Results  Component Value Date   WBC 6.9 01/09/2023   HGB 12.4 (L) 01/09/2023   HCT 35.7 (L) 01/09/2023   MCV 99.2 01/09/2023   PLT 223 01/09/2023      Chemistry      Component Value Date/Time   NA 132 (L) 01/09/2023 1327   NA 140 12/24/2020 1144   NA 142 08/02/2014 1649   K 3.8 01/09/2023 1327   K 4.2 08/02/2014 1649   CL 98 01/09/2023 1327   CL 109 (H) 08/02/2014 1649   CO2 25 01/09/2023 1327   CO2 21 08/02/2014 1649   BUN 10 01/09/2023 1327   BUN 7 12/24/2020 1144   BUN 11 08/02/2014 1649   CREATININE 0.56 (L) 01/09/2023 1327  CREATININE 0.92 08/02/2014 1649      Component Value Date/Time   CALCIUM 8.4 (L) 01/09/2023 1327   CALCIUM 8.6 08/02/2014 1649   ALKPHOS 82 01/09/2023 1327   ALKPHOS 82 08/02/2014 1649   AST 17 01/09/2023 1327   ALT 22 01/09/2023 1327   ALT 132 (H) 08/02/2014 1649   BILITOT 0.3 01/09/2023 1327       RADIOGRAPHIC STUDIES: CT Chest W Contrast  Result Date: 01/08/2023 CLINICAL DATA:  Non-small-cell lung cancer. Restaging. * Tracking Code: BO * EXAM: CT CHEST, ABDOMEN, AND PELVIS WITH CONTRAST TECHNIQUE: Multidetector CT imaging of the chest, abdomen and pelvis was performed following the standard protocol during bolus administration of intravenous contrast. RADIATION DOSE REDUCTION: This exam was performed according to the departmental dose-optimization program which includes automated exposure control, adjustment of the mA and/or kV according to patient size and/or use of iterative  reconstruction technique. CONTRAST:  32m OMNIPAQUE IOHEXOL 300 MG/ML  SOLN COMPARISON:  11/03/2022 chest CT.  PET-CT 08/04/2022. FINDINGS: CT CHEST FINDINGS Cardiovascular: The heart size is normal. No substantial pericardial effusion. Mild atherosclerotic calcification is noted in the wall of the thoracic aorta. Right Port-A-Cath tip is positioned in the right atrium. Mediastinum/Nodes: No mediastinal lymphadenopathy. There is no hilar lymphadenopathy. The esophagus has normal imaging features. There is no axillary lymphadenopathy. Lungs/Pleura: Centrilobular and paraseptal emphysema evident. Musculoskeletal: The mixed lytic and sclerotic changes in the T3 vertebral body are similar to prior (see axial 27/4 and well demonstrated on sagittal 94/6). This was better evaluated by MRI on 11/25/2022. A T9 lesion seen on that study is not readily evident on CT imaging. No new worrisome lytic or sclerotic osseous abnormality. Multiple bilateral nonacute rib fractures evident. CT ABDOMEN PELVIS FINDINGS Hepatobiliary: 4 mm hypodensity posterior right liver on 63/2 is stable since CT scan of 03/20/2020 consistent with benign etiology. There is no evidence for gallstones, gallbladder wall thickening, or pericholecystic fluid. No intrahepatic or extrahepatic biliary dilation. Pancreas: No focal mass lesion. No dilatation of the main duct. No intraparenchymal cyst. No peripancreatic edema. Spleen: No splenomegaly. No focal mass lesion. Adrenals/Urinary Tract: No adrenal nodule or mass. 1-2 mm nonobstructing interpolar right renal stone noted on 64/2. Left kidney unremarkable. No evidence for hydroureter. The urinary bladder appears normal for the degree of distention. Stomach/Bowel: Stomach is distended with food and fluid. Duodenum is normally positioned as is the ligament of Treitz. No small bowel wall thickening. No small bowel dilatation. Neither the terminal ileum nor the appendix are discretely visible. Prominent stool  volume noted transverse and left colon to the level of the rectum. Vascular/Lymphatic: There is moderate atherosclerotic calcification of the abdominal aorta without aneurysm. There is no gastrohepatic or hepatoduodenal ligament lymphadenopathy. No retroperitoneal or mesenteric lymphadenopathy. No pelvic sidewall lymphadenopathy. Reproductive: The prostate gland and seminal vesicles are unremarkable. Other: No intraperitoneal free fluid. Musculoskeletal: Status post vertebral augmentation at L2. Metastatic lesion in the L3 vertebral body again noted, better characterized on recent lumbar spine MRI of 11/25/2022. Body wall edema noted in the lower abdomen and pelvis. IMPRESSION: 1. Stable CT chest since 11/03/2022. No evidence for recurrent or metastatic disease in the thorax. 2. No findings to suggest metastatic disease in the abdomen or pelvis on today's study. 3. Multiple metastatic lesions identified in the thoracolumbar spine, as characterized on MR imaging 11/25/2022. 4. 1-2 mm nonobstructing interpolar right renal stone. 5. Prominent stool volume transverse and left colon to the level of the rectum. Imaging features could be compatible with constipation  in the appropriate clinical setting. 6. Aortic Atherosclerosis (ICD10-I70.0) and Emphysema (ICD10-J43.9). Electronically Signed   By: Misty Stanley M.D.   On: 01/08/2023 07:19   CT Abdomen Pelvis W Contrast  Result Date: 01/08/2023 CLINICAL DATA:  Non-small-cell lung cancer. Restaging. * Tracking Code: BO * EXAM: CT CHEST, ABDOMEN, AND PELVIS WITH CONTRAST TECHNIQUE: Multidetector CT imaging of the chest, abdomen and pelvis was performed following the standard protocol during bolus administration of intravenous contrast. RADIATION DOSE REDUCTION: This exam was performed according to the departmental dose-optimization program which includes automated exposure control, adjustment of the mA and/or kV according to patient size and/or use of iterative  reconstruction technique. CONTRAST:  37m OMNIPAQUE IOHEXOL 300 MG/ML  SOLN COMPARISON:  11/03/2022 chest CT.  PET-CT 08/04/2022. FINDINGS: CT CHEST FINDINGS Cardiovascular: The heart size is normal. No substantial pericardial effusion. Mild atherosclerotic calcification is noted in the wall of the thoracic aorta. Right Port-A-Cath tip is positioned in the right atrium. Mediastinum/Nodes: No mediastinal lymphadenopathy. There is no hilar lymphadenopathy. The esophagus has normal imaging features. There is no axillary lymphadenopathy. Lungs/Pleura: Centrilobular and paraseptal emphysema evident. Musculoskeletal: The mixed lytic and sclerotic changes in the T3 vertebral body are similar to prior (see axial 27/4 and well demonstrated on sagittal 94/6). This was better evaluated by MRI on 11/25/2022. A T9 lesion seen on that study is not readily evident on CT imaging. No new worrisome lytic or sclerotic osseous abnormality. Multiple bilateral nonacute rib fractures evident. CT ABDOMEN PELVIS FINDINGS Hepatobiliary: 4 mm hypodensity posterior right liver on 63/2 is stable since CT scan of 03/20/2020 consistent with benign etiology. There is no evidence for gallstones, gallbladder wall thickening, or pericholecystic fluid. No intrahepatic or extrahepatic biliary dilation. Pancreas: No focal mass lesion. No dilatation of the main duct. No intraparenchymal cyst. No peripancreatic edema. Spleen: No splenomegaly. No focal mass lesion. Adrenals/Urinary Tract: No adrenal nodule or mass. 1-2 mm nonobstructing interpolar right renal stone noted on 64/2. Left kidney unremarkable. No evidence for hydroureter. The urinary bladder appears normal for the degree of distention. Stomach/Bowel: Stomach is distended with food and fluid. Duodenum is normally positioned as is the ligament of Treitz. No small bowel wall thickening. No small bowel dilatation. Neither the terminal ileum nor the appendix are discretely visible. Prominent stool  volume noted transverse and left colon to the level of the rectum. Vascular/Lymphatic: There is moderate atherosclerotic calcification of the abdominal aorta without aneurysm. There is no gastrohepatic or hepatoduodenal ligament lymphadenopathy. No retroperitoneal or mesenteric lymphadenopathy. No pelvic sidewall lymphadenopathy. Reproductive: The prostate gland and seminal vesicles are unremarkable. Other: No intraperitoneal free fluid. Musculoskeletal: Status post vertebral augmentation at L2. Metastatic lesion in the L3 vertebral body again noted, better characterized on recent lumbar spine MRI of 11/25/2022. Body wall edema noted in the lower abdomen and pelvis. IMPRESSION: 1. Stable CT chest since 11/03/2022. No evidence for recurrent or metastatic disease in the thorax. 2. No findings to suggest metastatic disease in the abdomen or pelvis on today's study. 3. Multiple metastatic lesions identified in the thoracolumbar spine, as characterized on MR imaging 11/25/2022. 4. 1-2 mm nonobstructing interpolar right renal stone. 5. Prominent stool volume transverse and left colon to the level of the rectum. Imaging features could be compatible with constipation in the appropriate clinical setting. 6. Aortic Atherosclerosis (ICD10-I70.0) and Emphysema (ICD10-J43.9). Electronically Signed   By: EMisty StanleyM.D.   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 He has molecular studies by foundation 1 that showed no actionable mutations and PD-L1 expression was 90%. He underwent  palliative radiotherapy to the L2 vertebral body compression fraction as well as solitary brain metastasis with SRS. He is currently undergoing palliative systemic chemotherapy with carboplatin for AUC of 5, Alimta 500 Mg/M2 and Keytruda 200 Mg IV every 3 weeks.  Status post 7 cycles.  Starting from cycle #5 he is on treatment with maintenance Alimta and Keytruda every 3 weeks. The patient has been tolerating this treatment well with no concerning adverse effects. I recommended for him to proceed with cycle #8 today as planned. I will see him back for follow-up visit in 3 weeks for evaluation before the next cycle of his treatment. For the pain management, he is currently on MS Contin 15 mg p.o. twice daily in addition to Oxy IR for breakthrough pain and managed by the palliative care team.  He will was advised to call immediately if he has any other concerning symptoms in the interval. The patient voices understanding of current disease status and treatment options and is in agreement with the current care plan.  All questions were answered. The patient knows to call the clinic with any problems, questions or concerns. We can certainly see the patient much sooner if necessary.  The total time spent in the appointment was 20 minutes.  Disclaimer: This note was dictated with voice recognition software. Similar sounding words can inadvertently be transcribed and may not be corrected upon review.

## 2023-01-30 NOTE — Patient Instructions (Signed)
[facility]  Discharge Instructions: Thank you for choosing [facility] to provide your oncology and hematology care.   If you have a lab appointment with the Cancer Center, please go directly to the Cancer Center and check in at the registration area.   Wear comfortable clothing and clothing appropriate for easy access to any Portacath or PICC line.   We strive to give you quality time with your provider. You may need to reschedule your appointment if you arrive late (15 or more minutes).  Arriving late affects you and other patients whose appointments are after yours.  Also, if you miss three or more appointments without notifying the office, you may be dismissed from the clinic at the provider's discretion.      For prescription refill requests, have your pharmacy contact our office and allow 72 hours for refills to be completed.    Today you received the following chemotherapy and/or immunotherapy agents: Keytruda/Alimta      To help prevent nausea and vomiting after your treatment, we encourage you to take your nausea medication as directed.  BELOW ARE SYMPTOMS THAT SHOULD BE REPORTED IMMEDIATELY: *FEVER GREATER THAN 100.4 F (38 C) OR HIGHER *CHILLS OR SWEATING *NAUSEA AND VOMITING THAT IS NOT CONTROLLED WITH YOUR NAUSEA MEDICATION *UNUSUAL SHORTNESS OF BREATH *UNUSUAL BRUISING OR BLEEDING *URINARY PROBLEMS (pain or burning when urinating, or frequent urination) *BOWEL PROBLEMS (unusual diarrhea, constipation, pain near the anus) TENDERNESS IN MOUTH AND THROAT WITH OR WITHOUT PRESENCE OF ULCERS (sore throat, sores in mouth, or a toothache) UNUSUAL RASH, SWELLING OR PAIN  UNUSUAL VAGINAL DISCHARGE OR ITCHING   Items with * indicate a potential emergency and should be followed up as soon as possible or go to the Emergency Department if any problems should occur.  Please show the CHEMOTHERAPY ALERT CARD or IMMUNOTHERAPY ALERT CARD at  check-in to the Emergency Department and triage nurse.  Should you have questions after your visit or need to cancel or reschedule your appointment, please contact [facility]  Dept: [phone]  and follow the prompts.  Office hours are 8:00 a.m. to 4:30 p.m. Monday - Friday. Please note that voicemails left after 4:00 p.m. may not be returned until the following business day.  We are closed weekends and major holidays. You have access to a nurse at all times for urgent questions. Please call the main number to the clinic Dept: [phone] and follow the prompts.   For any non-urgent questions, you may also contact your provider using MyChart. We now offer e-Visits for anyone 18 and older to request care online for non-urgent symptoms. For details visit [email or web address].   Also download the MyChart app! Go to the app store, search "MyChart", open the app, select [facility], and log in with your MyChart username and password.   

## 2023-02-10 ENCOUNTER — Telehealth: Payer: Self-pay

## 2023-02-10 NOTE — Telephone Encounter (Signed)
Copied from [place] [phone]. Topic: Appointment Scheduling - Scheduling Inquiry for Clinic >> Feb 10, 2023  3:07 PM Marcellus Scott wrote: Reason for CRM:Pt is requesting to cancel upcoming appointment with Lurena Joiner and is requesting a callback to reschedule.   Please advise.

## 2023-02-14 ENCOUNTER — Ambulatory Visit: Payer: Medicaid Other | Admitting: Pharmacist

## 2023-02-20 ENCOUNTER — Other Ambulatory Visit (HOSPITAL_COMMUNITY): Payer: Self-pay

## 2023-02-20 ENCOUNTER — Inpatient Hospital Stay: Payer: Medicaid Other

## 2023-02-20 ENCOUNTER — Inpatient Hospital Stay (HOSPITAL_BASED_OUTPATIENT_CLINIC_OR_DEPARTMENT_OTHER): Payer: Medicaid Other | Admitting: Nurse Practitioner

## 2023-02-20 ENCOUNTER — Other Ambulatory Visit: Payer: Self-pay

## 2023-02-20 ENCOUNTER — Inpatient Hospital Stay: Payer: Medicaid Other | Attending: Adult Health

## 2023-02-20 ENCOUNTER — Encounter: Payer: Self-pay | Admitting: Medical Oncology

## 2023-02-20 ENCOUNTER — Encounter: Payer: Self-pay | Admitting: Nurse Practitioner

## 2023-02-20 ENCOUNTER — Inpatient Hospital Stay (HOSPITAL_BASED_OUTPATIENT_CLINIC_OR_DEPARTMENT_OTHER): Payer: Medicaid Other | Admitting: Internal Medicine

## 2023-02-20 VITALS — BP 124/81 | HR 86 | Temp 98.2°F | Resp 16 | Wt 153.0 lb

## 2023-02-20 DIAGNOSIS — Z7984 Long term (current) use of oral hypoglycemic drugs: Secondary | ICD-10-CM | POA: Insufficient documentation

## 2023-02-20 DIAGNOSIS — C3490 Malignant neoplasm of unspecified part of unspecified bronchus or lung: Secondary | ICD-10-CM

## 2023-02-20 DIAGNOSIS — R590 Localized enlarged lymph nodes: Secondary | ICD-10-CM | POA: Insufficient documentation

## 2023-02-20 DIAGNOSIS — R530 Neoplastic (malignant) related fatigue: Secondary | ICD-10-CM | POA: Diagnosis not present

## 2023-02-20 DIAGNOSIS — Z79899 Other long term (current) drug therapy: Secondary | ICD-10-CM | POA: Insufficient documentation

## 2023-02-20 DIAGNOSIS — Z923 Personal history of irradiation: Secondary | ICD-10-CM | POA: Insufficient documentation

## 2023-02-20 DIAGNOSIS — C7951 Secondary malignant neoplasm of bone: Secondary | ICD-10-CM | POA: Insufficient documentation

## 2023-02-20 DIAGNOSIS — Z515 Encounter for palliative care: Secondary | ICD-10-CM

## 2023-02-20 DIAGNOSIS — E119 Type 2 diabetes mellitus without complications: Secondary | ICD-10-CM | POA: Diagnosis not present

## 2023-02-20 DIAGNOSIS — J90 Pleural effusion, not elsewhere classified: Secondary | ICD-10-CM | POA: Insufficient documentation

## 2023-02-20 DIAGNOSIS — Z5112 Encounter for antineoplastic immunotherapy: Secondary | ICD-10-CM | POA: Insufficient documentation

## 2023-02-20 DIAGNOSIS — C3491 Malignant neoplasm of unspecified part of right bronchus or lung: Secondary | ICD-10-CM | POA: Insufficient documentation

## 2023-02-20 DIAGNOSIS — M792 Neuralgia and neuritis, unspecified: Secondary | ICD-10-CM

## 2023-02-20 DIAGNOSIS — C7931 Secondary malignant neoplasm of brain: Secondary | ICD-10-CM

## 2023-02-20 DIAGNOSIS — G893 Neoplasm related pain (acute) (chronic): Secondary | ICD-10-CM | POA: Diagnosis not present

## 2023-02-20 LAB — CMP (CANCER CENTER ONLY)
ALT: 36 U/L (ref 0–44)
AST: 24 U/L (ref 15–41)
Albumin: 3.8 g/dL (ref 3.5–5.0)
Alkaline Phosphatase: 83 U/L (ref 38–126)
Anion gap: 5 (ref 5–15)
BUN: 18 mg/dL (ref 6–20)
CO2: 29 mmol/L (ref 22–32)
Calcium: 9.5 mg/dL (ref 8.9–10.3)
Chloride: 103 mmol/L (ref 98–111)
Creatinine: 0.72 mg/dL (ref 0.61–1.24)
GFR, Estimated: >60 mL/min (ref >60)
Glucose, Bld: 237 mg/dL — ABNORMAL HIGH (ref 70–99)
Potassium: 3.7 mmol/L (ref 3.5–5.1)
Sodium: 137 mmol/L (ref 135–145)
Total Bilirubin: 0.3 mg/dL (ref 0.3–1.2)
Total Protein: 7.1 g/dL (ref 6.5–8.1)

## 2023-02-20 LAB — CBC WITH DIFFERENTIAL (CANCER CENTER ONLY)
Abs Immature Granulocytes: 0.02 10*3/uL (ref 0.00–0.07)
Basophils Absolute: 0 10*3/uL (ref 0.0–0.1)
Basophils Relative: 0 %
Eosinophils Absolute: 0.1 10*3/uL (ref 0.0–0.5)
Eosinophils Relative: 1 %
HCT: 36.8 % — ABNORMAL LOW (ref 39.0–52.0)
Hemoglobin: 12.6 g/dL — ABNORMAL LOW (ref 13.0–17.0)
Immature Granulocytes: 0 %
Lymphocytes Relative: 14 %
Lymphs Abs: 1 10*3/uL (ref 0.7–4.0)
MCH: 34 pg (ref 26.0–34.0)
MCHC: 34.2 g/dL (ref 30.0–36.0)
MCV: 99.2 fL (ref 80.0–100.0)
Monocytes Absolute: 0.5 10*3/uL (ref 0.1–1.0)
Monocytes Relative: 7 %
Neutro Abs: 5.1 10*3/uL (ref 1.7–7.7)
Neutrophils Relative %: 78 %
Platelet Count: 194 10*3/uL (ref 150–400)
RBC: 3.71 MIL/uL — ABNORMAL LOW (ref 4.22–5.81)
RDW: 12.6 % (ref 11.5–15.5)
WBC Count: 6.6 10*3/uL (ref 4.0–10.5)
nRBC: 0 % (ref 0.0–0.2)

## 2023-02-20 LAB — TSH: TSH: 0.989 u[IU]/mL (ref 0.350–4.500)

## 2023-02-20 MED ORDER — PROCHLORPERAZINE MALEATE 10 MG PO TABS
10.0000 mg | ORAL_TABLET | Freq: Once | ORAL | Status: AC
  Administered 2023-02-20: 10 mg via ORAL
  Filled 2023-02-20: qty 1

## 2023-02-20 MED ORDER — SODIUM CHLORIDE 0.9 % IV SOLN: Freq: Once | INTRAVENOUS | Status: AC

## 2023-02-20 MED ORDER — SODIUM CHLORIDE 0.9 % IV SOLN
500.0000 mg/m2 | Freq: Once | INTRAVENOUS | Status: AC
  Administered 2023-02-20: 900 mg via INTRAVENOUS
  Filled 2023-02-20: qty 20

## 2023-02-20 MED ORDER — CYANOCOBALAMIN 1000 MCG/ML IJ SOLN
1000.0000 ug | Freq: Once | INTRAMUSCULAR | Status: AC
  Administered 2023-02-20: 1000 ug via INTRAMUSCULAR
  Filled 2023-02-20: qty 1

## 2023-02-20 MED ORDER — OXYCODONE HCL 5 MG PO TABS
5.0000 mg | ORAL_TABLET | ORAL | 0 refills | Status: DC | PRN
Start: 2023-02-23 — End: 2023-03-13
  Filled 2023-02-23: qty 120, 10d supply, fill #0

## 2023-02-20 MED ORDER — SODIUM CHLORIDE 0.9% FLUSH
10.0000 mL | Freq: Once | INTRAVENOUS | Status: AC
  Administered 2023-02-20: 10 mL

## 2023-02-20 MED ORDER — MORPHINE SULFATE ER 15 MG PO TBCR
15.0000 mg | EXTENDED_RELEASE_TABLET | Freq: Two times a day (BID) | ORAL | 0 refills | Status: DC
Start: 2023-02-23 — End: 2023-03-13
  Filled 2023-02-23: qty 60, 30d supply, fill #0

## 2023-02-20 MED ORDER — SODIUM CHLORIDE 0.9% FLUSH
10.0000 mL | INTRAVENOUS | Status: DC | PRN
  Administered 2023-02-20: 10 mL

## 2023-02-20 MED ORDER — HEPARIN SOD (PORK) LOCK FLUSH 100 UNIT/ML IV SOLN
500.0000 [IU] | Freq: Once | INTRAVENOUS | Status: AC | PRN
  Administered 2023-02-20: 500 [IU]

## 2023-02-20 MED ORDER — SODIUM CHLORIDE 0.9 % IV SOLN
200.0000 mg | Freq: Once | INTRAVENOUS | Status: AC
  Administered 2023-02-20: 200 mg via INTRAVENOUS
  Filled 2023-02-20: qty 8

## 2023-02-20 NOTE — Progress Notes (Signed)
[place] Telephone:(336) [phone]   Fax:(336) [phone]  OFFICE PROGRESS NOTE  Samuel Stain, MD 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  Biomarker Findings Microsatellite status - MS-Stable Tumor Mutational Burden - 8 Muts/Mb Genomic Findings For a complete list of the genes assayed, please refer to the Appendix. KEAP1 Q227* CDKN2A/B p16INK4a G89V PRKCI amplification TERC amplification - equivocal? TP53 D280fs*18 8 Disease relevant genes with no reportable alterations: ALK, BRAF, EGFR, ERBB2, KRAS, MET, RET, ROS1  PDL1 TPS  90%  PRIOR THERAPY:  1) Status post palliative radiotherapy to the L2 lesion under the care of Dr. Tammi Klippel. 2) Status post SRS to solitary brain metastasis under the care of Dr. Tammi Klippel  CURRENT THERAPY: Palliative systemic therapy with carboplatin for AUC of 5, Alimta 500 Mg/M2 and Keytruda 200 Mg IV every 3 weeks.  First dose September 05, 2022.  Status post 8 cycles.  Starting from cycle #5 the patient will be on maintenance treatment with Alimta and Keytruda every 3 weeks.  INTERVAL HISTORY: Samuel [name] 57 y.o. male turns to the clinic today for follow-up visit.  The patient is feeling fine today with no concerning complaints except for the muscle spasm in the back.  He is scheduled for repeat imaging studies with MRI of the cervical spine in less than 2 weeks by Dr. Tammi Klippel.  He denied having any current chest pain, shortness of breath, cough or hemoptysis.  He has no nausea, vomiting, diarrhea or constipation.  He has no headache or visual  changes.  He denied having any recent weight loss or night sweats.  He is here today for evaluation before starting cycle #9 of his treatment.   MEDICAL HISTORY: Past Medical History:  Diagnosis Date   Alcohol use disorder, severe, dependence ([place]) 12/26/2016   Diabetes mellitus without complication ([place])    Metastatic cancer to brain ([facility]) 08/05/2022   Metastatic cancer to spine ([facility]) 07/19/2022   Pancreatitis unk   Primary lung adenocarcinoma ([facility]) 08/05/2022   Suicidal ideation    Type 2 diabetes mellitus with hyperglycemia, without long-term current use of insulin ([place]) 06/13/2022   Formatting of this note might be different from the original. 06/13/2022 A1C 13.8, FSBG 414 Start empagliflozin 5mg /metformin 1000 daily, levemir 20 qhs. (Samples given) Will apply for medassist for pharmacy    ALLERGIES:  is allergic to penicillins and percocet [oxycodone-acetaminophen].  MEDICATIONS:  Current Outpatient Medications  Medication Sig Dispense Refill   Accu-Chek Softclix Lancets lancets Use to check blood sugar 3 times daily. E11.69 100 each 6   Blood Glucose Monitoring Suppl (ACCU-CHEK GUIDE) w/Device KIT Use to check blood sugar 3 times daily. E11.69 1 kit 0   glucose blood (ACCU-CHEK GUIDE) test strip Use to check blood sugar 3 times daily. E11.69 100 each 6   acetaminophen (TYLENOL) 500 MG tablet Take 1,000 mg by mouth every 6 (six) hours as needed for mild pain or moderate pain.     atorvastatin (LIPITOR) 40 MG tablet Take 1 tablet (40 mg total) by mouth daily. 30 tablet 2   blood glucose meter kit and supplies KIT Dispense based  on patient and insurance preference. Use up to four times daily as directed. 1 each 0   empagliflozin (JARDIANCE) 10 MG TABS tablet Take 1 tablet (10 mg total) by mouth daily before breakfast. 60 tablet 4   folic acid (FOLVITE) 1 MG tablet Take 1 tablet (1 mg total) by mouth daily. 30 tablet 4   gabapentin (NEURONTIN) 300 MG capsule Take 2 capsules (600 mg  total) by mouth 3 (three) times daily. 180 capsule 1   insulin aspart protamine - aspart (NOVOLOG MIX 70/30 FLEXPEN) (70-30) 100 UNIT/ML FlexPen Inject 25 Units into the skin 2 (two) times daily with a meal. 15 mL 5   Insulin Pen Needle 32G X 4 MM MISC Use with Insulin pen 100 each 3   ipratropium-albuterol (DUONEB) 0.5-2.5 (3) MG/3ML SOLN Take 3 mLs by nebulization every 6 (six) hours as needed (Shortness of breath). 360 mL 0   lidocaine-prilocaine (EMLA) cream Apply to the Port-A-Cath site 30-60 minutes before chemo. (Patient not taking: Reported on 01/19/2023) 30 g 0   morphine (MS CONTIN) 15 MG 12 hr tablet Take 1 tablet (15 mg total) by mouth every 12 (twelve) hours. 60 tablet 0   oxyCODONE (OXY IR/ROXICODONE) 5 MG immediate release tablet Take 1 - 2 tablets (5 - 10 mg total) by mouth every 4 hours as needed for severe pain. 120 tablet 0   prochlorperazine (COMPAZINE) 10 MG tablet Take 1 tablet (10 mg total) by mouth every 6 (six) hours as needed for nausea or vomiting. (Patient not taking: Reported on 01/19/2023) 30 tablet 0   No current facility-administered medications for this visit.    SURGICAL HISTORY:  Past Surgical History:  Procedure Laterality Date   IR BONE TUMOR(S)RF ABLATION  08/01/2022   IR IMAGING GUIDED PORT INSERTION  09/21/2022   IR KYPHO LUMBAR INC FX REDUCE BONE BX UNI/BIL CANNULATION INC/IMAGING  08/01/2022    REVIEW OF SYSTEMS:  A comprehensive review of systems was negative except for: Musculoskeletal: positive for back pain   PHYSICAL EXAMINATION: General appearance: alert, cooperative, fatigued, and no distress Head: Normocephalic, without obvious abnormality, atraumatic Neck: no adenopathy, no JVD, supple, symmetrical, trachea midline, and thyroid not enlarged, symmetric, no tenderness/mass/nodules Lymph nodes: Cervical, supraclavicular, and axillary nodes normal. Resp: clear to auscultation bilaterally Back: symmetric, no curvature. ROM normal. No CVA  tenderness. Cardio: regular rate and rhythm, S1, S2 normal, no murmur, click, rub or gallop GI: soft, non-tender; bowel sounds normal; no masses,  no organomegaly Extremities: extremities normal, atraumatic, no cyanosis or edema  ECOG PERFORMANCE STATUS: 1 - Symptomatic but completely ambulatory  Blood pressure 124/81, pulse 86, temperature 98.2 F (36.8 C), temperature source Oral, resp. rate 16, weight 153 lb (69.4 kg), SpO2 100 %.  LABORATORY DATA: Lab Results  Component Value Date   WBC 6.6 02/20/2023   HGB 12.6 (L) 02/20/2023   HCT 36.8 (L) 02/20/2023   MCV 99.2 02/20/2023   PLT 194 02/20/2023      Chemistry      Component Value Date/Time   NA 137 02/20/2023 0951   NA 140 12/24/2020 1144   NA 142 08/02/2014 1649   K 3.7 02/20/2023 0951   K 4.2 08/02/2014 1649   CL 103 02/20/2023 0951   CL 109 (H) 08/02/2014 1649   CO2 29 02/20/2023 0951   CO2 21 08/02/2014 1649   BUN 18 02/20/2023 0951   BUN 7 12/24/2020 1144   BUN 11 08/02/2014 1649   CREATININE 0.72 02/20/2023 0951  CREATININE 0.92 08/02/2014 1649      Component Value Date/Time   CALCIUM 9.5 02/20/2023 0951   CALCIUM 8.6 08/02/2014 1649   ALKPHOS 83 02/20/2023 0951   ALKPHOS 82 08/02/2014 1649   AST 24 02/20/2023 0951   ALT 36 02/20/2023 0951   ALT 132 (H) 08/02/2014 1649   BILITOT 0.3 02/20/2023 0951       RADIOGRAPHIC STUDIES: No results found.  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 He has molecular studies by foundation 1 that showed no actionable mutations and PD-L1 expression was  90%. He underwent palliative radiotherapy to the L2 vertebral body compression fraction as well as solitary brain metastasis with SRS. He is currently undergoing palliative systemic chemotherapy with carboplatin for AUC of 5, Alimta 500 Mg/M2 and Keytruda 200 Mg IV every 3 weeks.  Status post 8 cycles.  Starting from cycle #5 he is on treatment with maintenance Alimta and Keytruda every 3 weeks. He has been tolerating this treatment well with no concerning adverse effects. I recommended for him to proceed with cycle #9 today as planned. I will see him back for follow-up visit in 3 weeks for evaluation with repeat CT scan of the chest, abdomen and pelvis for restaging of his disease. For the pain management, he is currently on MS Contin 15 mg p.o. twice daily in addition to Oxy IR for breakthrough pain and managed by the palliative care team.  The patient was advised to call immediately if he has any other concerning symptoms in the interval. The patient voices understanding of current disease status and treatment options and is in agreement with the current care plan.  All questions were answered. The patient knows to call the clinic with any problems, questions or concerns. We can certainly see the patient much sooner if necessary.  The total time spent in the appointment was 20 minutes.  Disclaimer: This note was dictated with voice recognition software. Similar sounding words can inadvertently be transcribed and may not be corrected upon review.

## 2023-02-20 NOTE — Addendum Note (Signed)
Addended by: Jimmy Footman on: 02/20/2023 12:20 PM   Modules accepted: Orders

## 2023-02-20 NOTE — Progress Notes (Signed)
Adjust alimta dose based on today's BSA per Dr Julien Nordmann

## 2023-02-20 NOTE — Progress Notes (Signed)
Patient seen by Dr. Mohamed  Vitals are within treatment parameters.  Labs reviewed: and are within treatment parameters.  Per physician team, patient is ready for treatment and there are NO modifications to the treatment plan.  

## 2023-02-20 NOTE — Progress Notes (Signed)
[facility]  Telephone:(336) [phone] Fax:(336) [phone]   Name: Samuel [name] Date: 02/20/2023 MRN: [id number]  DOB: 1966/06/30  Patient Care Team: Elsie Stain, MD as PCP - General (Pulmonary Disease) Camillia Herter, NP as Nurse Practitioner (Nurse Practitioner) Debera Lat, Carlena Sax, NP as Nurse Practitioner (Nurse Practitioner)    INTERVAL HISTORY: Samuel [name] is a 57 y.o. 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  Palliative ask to see for symptom management and goals of care.   SOCIAL HISTORY:     reports that he has been smoking cigarettes. He has been smoking an average of 1 pack per day. He has never used smokeless tobacco. He reports current alcohol use of about 50.0 standard drinks of alcohol per week. He reports that he does not use drugs.  ADVANCE DIRECTIVES:  Patient does not have an advanced directives. Education and packet provided. He has expressed interest in completing. States his sister, Samuel [name] would be his designated Scientist, research (medical).   CODE STATUS: Full code  PAST MEDICAL HISTORY: Past Medical History:  Diagnosis Date   Alcohol use disorder, severe, dependence ([facility]) 12/26/2016   Diabetes mellitus without complication ([facility])    Metastatic cancer to brain ([facility] [facility]) 08/05/2022   Metastatic cancer to spine ([facility]) 07/19/2022   Pancreatitis unk   Primary lung adenocarcinoma ([facility]) 08/05/2022   Suicidal ideation    Type 2 diabetes mellitus with hyperglycemia, without long-term current use of insulin ([place]) 06/13/2022   Formatting of this note might be different from the original. 06/13/2022 A1C 13.8, FSBG 414 Start empagliflozin 5mg /metformin 1000 daily, levemir 20 qhs. (Samples given) Will apply for medassist for pharmacy    ALLERGIES:  is allergic to penicillins and  percocet [oxycodone-acetaminophen].  MEDICATIONS:  Current Outpatient Medications  Medication Sig Dispense Refill   Accu-Chek Softclix Lancets lancets Use to check blood sugar 3 times daily. E11.69 100 each 6   Blood Glucose Monitoring Suppl (ACCU-CHEK GUIDE) w/Device KIT Use to check blood sugar 3 times daily. E11.69 1 kit 0   glucose blood (ACCU-CHEK GUIDE) test strip Use to check blood sugar 3 times daily. E11.69 100 each 6   acetaminophen (TYLENOL) 500 MG tablet Take 1,000 mg by mouth every 6 (six) hours as needed for mild pain or moderate pain.     atorvastatin (LIPITOR) 40 MG tablet Take 1 tablet (40 mg total) by mouth daily. 30 tablet 2   blood glucose meter kit and supplies KIT Dispense based on patient and insurance preference. Use up to four times daily as directed. 1 each 0   empagliflozin (JARDIANCE) 10 MG TABS tablet Take 1 tablet (10 mg total) by mouth daily before breakfast. 60 tablet 4   folic acid (FOLVITE) 1 MG tablet Take 1 tablet (1 mg total) by mouth daily. 30 tablet 4   gabapentin (NEURONTIN) 300 MG capsule Take 2 capsules (600 mg total) by mouth 3 (three) times daily. 180 capsule 1   insulin aspart protamine - aspart (NOVOLOG MIX 70/30 FLEXPEN) (70-30) 100 UNIT/ML FlexPen Inject 25 Units into the skin 2 (two) times daily with a meal. 15 mL 5   Insulin Pen Needle 32G X 4 MM MISC Use with Insulin pen 100 each 3   ipratropium-albuterol (DUONEB) 0.5-2.5 (3) MG/3ML SOLN Take 3 mLs by nebulization every 6 (six) hours as needed (Shortness of breath).  360 mL 0   lidocaine-prilocaine (EMLA) cream Apply to the Port-A-Cath site 30-60 minutes before chemo. (Patient not taking: Reported on 01/19/2023) 30 g 0   morphine (MS CONTIN) 15 MG 12 hr tablet Take 1 tablet (15 mg total) by mouth every 12 (twelve) hours. 60 tablet 0   oxyCODONE (OXY IR/ROXICODONE) 5 MG immediate release tablet Take 1 - 2 tablets (5 - 10 mg total) by mouth every 4 hours as needed for severe pain. 120 tablet 0    prochlorperazine (COMPAZINE) 10 MG tablet Take 1 tablet (10 mg total) by mouth every 6 (six) hours as needed for nausea or vomiting. (Patient not taking: Reported on 01/19/2023) 30 tablet 0   No current facility-administered medications for this visit.    VITAL SIGNS: There were no vitals taken for this visit. There were no vitals filed for this visit.  Estimated body mass index is 22.09 kg/m as calculated from the following:   Height as of 11/07/22: 5\' 8"  (1.727 m).   Weight as of 01/30/23: 145 lb 4.8 oz (65.9 kg).   PERFORMANCE STATUS (ECOG) : 1 - Symptomatic but completely ambulatory   Physical Exam General: NAD Cardiovascular: regular rate and rhythm Pulmonary:normal breathing pattern  Extremities: no edema, no joint deformities, brace in place  Skin: no rashes Neurological: AAO x3  IMPRESSION: Samuel [name] presents to clinic for symptom management follow-up. No acute distress. No family present. Denies nausea, vomiting, constipation, or diarrhea. Is remaining active. Hopeful to discontinued brace use in the near future. He has increased his activity by walking outside more since weather is warming up. Reports new membership to [employer] however knows to listen to his body and take things slow. He is much appreciative of how well he is feeling.   Neoplasm related pain Avid reports pain is well controlled on current regimen.    We reviewed his current regimen.  Samuel [name] is taking oxycodone 5-10 mg every 4 hours as needed for breakthrough pain and MS Contin 15 mg every 12 hours.  Tolerating medications as prescribed.  Doesn't require breakthrough medications around the clock. Some days are better than others. He is taking responsibly with where appropriate refill request.  Does not require breakthrough medication around-the-clock.  Will continue to closely monitor and adjust as needed.  Pain contract on file.    2.  Appetite Appetite is much improved.  He is able to eat foods  that he enjoys.  Weight has increased to 153lbs up from 145lbs on 3/11. He reports his pre-diagnosis weight averaged 150-160 and he is glad to see he is reaching this point.   PLAN: MS Contin 15 mg at bedtime only  Oxy IR 5-10 mg every 4-6 hours as needed for breakthrough pain.  Is only taken 1-2 times daily. Colace twice daily Narcan for emergency use Appetite much improved.Weight is increasing. Pain contract signed on 10/23. I will plan to see patient back in 3-4 weeks in collaboration with his other oncology appointments.   Patient expressed understanding and was in agreement with this plan. He also understands that He can call the clinic at any time with any questions, concerns, or complaints.       Any controlled substances utilized were prescribed in the context of palliative care. PDMP has been reviewed.    Visit consisted of counseling and education dealing with the complex and emotionally intense issues of symptom management and palliative care in the setting of serious and potentially life-threatening illness.Greater than 50%  of  this time was spent counseling and coordinating care related to the above assessment and plan.  Alda Lea, AGPCNP-BC  Palliative Medicine Team/[place] [facility]  *Please note that this is a verbal dictation therefore any spelling or grammatical errors are due to the "[employer]" system interpretation.

## 2023-02-20 NOTE — Patient Instructions (Signed)
[facility]  Discharge Instructions: Thank you for choosing [facility] to provide your oncology and hematology care.   If you have a lab appointment with the Cancer Center, please go directly to the Cancer Center and check in at the registration area.   Wear comfortable clothing and clothing appropriate for easy access to any Portacath or PICC line.   We strive to give you quality time with your provider. You may need to reschedule your appointment if you arrive late (15 or more minutes).  Arriving late affects you and other patients whose appointments are after yours.  Also, if you miss three or more appointments without notifying the office, you may be dismissed from the clinic at the provider's discretion.      For prescription refill requests, have your pharmacy contact our office and allow 72 hours for refills to be completed.    Today you received the following chemotherapy and/or immunotherapy agents: Keytruda and Alimta      To help prevent nausea and vomiting after your treatment, we encourage you to take your nausea medication as directed.  BELOW ARE SYMPTOMS THAT SHOULD BE REPORTED IMMEDIATELY: *FEVER GREATER THAN 100.4 F (38 C) OR HIGHER *CHILLS OR SWEATING *NAUSEA AND VOMITING THAT IS NOT CONTROLLED WITH YOUR NAUSEA MEDICATION *UNUSUAL SHORTNESS OF BREATH *UNUSUAL BRUISING OR BLEEDING *URINARY PROBLEMS (pain or burning when urinating, or frequent urination) *BOWEL PROBLEMS (unusual diarrhea, constipation, pain near the anus) TENDERNESS IN MOUTH AND THROAT WITH OR WITHOUT PRESENCE OF ULCERS (sore throat, sores in mouth, or a toothache) UNUSUAL RASH, SWELLING OR PAIN  UNUSUAL VAGINAL DISCHARGE OR ITCHING   Items with * indicate a potential emergency and should be followed up as soon as possible or go to the Emergency Department if any problems should occur.  Please show the CHEMOTHERAPY ALERT CARD or IMMUNOTHERAPY ALERT  CARD at check-in to the Emergency Department and triage nurse.  Should you have questions after your visit or need to cancel or reschedule your appointment, please contact [facility]  Dept: [phone]  and follow the prompts.  Office hours are 8:00 a.m. to 4:30 p.m. Monday - Friday. Please note that voicemails left after 4:00 p.m. may not be returned until the following business day.  We are closed weekends and major holidays. You have access to a nurse at all times for urgent questions. Please call the main number to the clinic Dept: [phone] and follow the prompts.   For any non-urgent questions, you may also contact your provider using MyChart. We now offer e-Visits for anyone 18 and older to request care online for non-urgent symptoms. For details visit [email or web address].   Also download the MyChart app! Go to the app store, search "MyChart", open the app, select [facility], and log in with your MyChart username and password.   

## 2023-02-21 LAB — T4: T4, Total: 9.9 ug/dL (ref 4.5–12.0)

## 2023-02-23 ENCOUNTER — Other Ambulatory Visit (HOSPITAL_COMMUNITY): Payer: Self-pay

## 2023-02-23 ENCOUNTER — Other Ambulatory Visit: Payer: Self-pay

## 2023-03-01 ENCOUNTER — Ambulatory Visit (HOSPITAL_COMMUNITY)
Admission: RE | Admit: 2023-03-01 | Discharge: 2023-03-01 | Disposition: A | Discharge status: Home / Self Care | Payer: Medicaid Other | Source: Ambulatory Visit | Attending: Radiation Oncology | Admitting: Radiation Oncology

## 2023-03-01 DIAGNOSIS — C7931 Secondary malignant neoplasm of brain: Secondary | ICD-10-CM

## 2023-03-01 DIAGNOSIS — C7949 Secondary malignant neoplasm of other parts of nervous system: Secondary | ICD-10-CM | POA: Insufficient documentation

## 2023-03-01 MED ORDER — GADOBUTROL 1 MMOL/ML IV SOLN
7.0000 mL | Freq: Once | INTRAVENOUS | Status: AC | PRN
  Administered 2023-03-01: 7 mL via INTRAVENOUS

## 2023-03-02 ENCOUNTER — Ambulatory Visit (HOSPITAL_COMMUNITY): Payer: Medicaid Other

## 2023-03-06 ENCOUNTER — Inpatient Hospital Stay: Payer: Medicaid Other

## 2023-03-08 ENCOUNTER — Ambulatory Visit
Admission: RE | Admit: 2023-03-08 | Discharge: 2023-03-08 | Disposition: A | Discharge status: Home / Self Care | Payer: Medicaid Other | Source: Ambulatory Visit | Attending: Urology | Admitting: Urology

## 2023-03-08 ENCOUNTER — Encounter: Payer: Self-pay | Admitting: Urology

## 2023-03-08 DIAGNOSIS — C7951 Secondary malignant neoplasm of bone: Secondary | ICD-10-CM

## 2023-03-08 DIAGNOSIS — C7931 Secondary malignant neoplasm of brain: Secondary | ICD-10-CM

## 2023-03-08 DIAGNOSIS — C3491 Malignant neoplasm of unspecified part of right bronchus or lung: Secondary | ICD-10-CM

## 2023-03-08 NOTE — Progress Notes (Signed)
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  The patient was notified in advance and gave permission to proceed with this visit format.  He tolerated radiation therapy very well and has appreciated significant improvement in his back pain. He has continued to tolerate the systemic therapy well and completed 4 cycles of the carboplatin/Alimta/Keytruda and before transitioning to maintenance therapy with Alimta/Keytruda only starting from cycle 5. He feels like his energy and strength continue improving since coming off the carboplatinin and he has been able to gradually increase his activity level which he is pleased with.  His restaging CT C/A/P scans from 01/06/23 continue to show an excellent response to treatment with no evidence for recurrence or metastatic disease in the thorax, abdomen or pelvis and a stable appearance of the osseous metastatic disease in the spine. He is scheduled for repeat systemic imaging for disease restaging 03/10/23, prior to his next follow up visit with Dr. Arbutus Ped.  He had  a recent MRI brain scan on 03/01/23 that showed overall improved  disease with resolution of the treated left temporal and right occipital lesions and no change in the appearance of the treated punctate lesion in the right cerebellum. No new lesions were identified. MRI of the lumbar and thoracic spine were performed that same day and this showed stability of the treated metastatic disease in the lumbar spine (L2) and an overall unchanged appearance of the metastatic disease in the thoracic and lumbar spine compared to the MRI from 11/25/2022. No new lesions or epidural soft tissue tumor is identified.  On review of systems, the patient states that he is doing well in general. He feels like he is getting stronger each day and continues with more energy since completing the chemotherapy portion of his treatment.  He does continue with some mild back pain in the mid to lower back but reports that this is well-controlled with pain medication.  He continues using a back brace for support and has joined [employer] to help with stretching, strength and balance.  He denies any paresthesias or focal weakness in the upper or lower extremities and has not noticed any change in his bowel or bladder habits.  He does have occasional muscle cramps in the low back but these usually resolve spontaneously with repositioning and stretching. He denies headaches, nausea, vomiting, dizziness, slurred speech, changes in visual or auditory acuity or tremors.  Overall, he is quite pleased with his progress to date.  ALLERGIES:  is allergic to penicillins and percocet [oxycodone-acetaminophen].  Meds: Current Outpatient Medications  Medication Sig Dispense Refill   Accu-Chek Softclix Lancets lancets Use  to check blood sugar 3 times daily. E11.69 100 each 6   Blood Glucose Monitoring Suppl (ACCU-CHEK GUIDE) w/Device KIT Use to check blood sugar 3 times daily. E11.69 1 kit 0   glucose blood (ACCU-CHEK GUIDE) test strip Use to check blood sugar 3 times daily. E11.69 100 each 6   acetaminophen  (TYLENOL) 500 MG tablet Take 1,000 mg by mouth every 6 (six) hours as needed for mild pain or moderate pain.     atorvastatin (LIPITOR) 40 MG tablet Take 1 tablet (40 mg total) by mouth daily. 30 tablet 2   blood glucose meter kit and supplies KIT Dispense based on patient and insurance preference. Use up to four times daily as directed. 1 each 0   empagliflozin (JARDIANCE) 10 MG TABS tablet Take 1 tablet (10 mg total) by mouth daily before breakfast. 60 tablet 4   folic acid (FOLVITE) 1 MG tablet Take 1 tablet (1 mg total) by mouth daily. 30 tablet 4   gabapentin (NEURONTIN) 300 MG capsule Take 2 capsules (600 mg total) by mouth 3 (three) times daily. 180 capsule 1   insulin aspart protamine - aspart (NOVOLOG MIX 70/30 FLEXPEN) (70-30) 100 UNIT/ML FlexPen Inject 25 Units into the skin 2 (two) times daily with a meal. 15 mL 5   Insulin Pen Needle 32G X 4 MM MISC Use with Insulin pen 100 each 3   ipratropium-albuterol (DUONEB) 0.5-2.5 (3) MG/3ML SOLN Take 3 mLs by nebulization every 6 (six) hours as needed (Shortness of breath). 360 mL 0   lidocaine-prilocaine (EMLA) cream Apply to the Port-A-Cath site 30-60 minutes before chemo. (Patient not taking: Reported on 01/19/2023) 30 g 0   morphine (MS CONTIN) 15 MG 12 hr tablet Take 1 tablet (15 mg total) by mouth every 12 (twelve) hours. 60 tablet 0   oxyCODONE (OXY IR/ROXICODONE) 5 MG immediate release tablet Take 1 - 2 tablets (5 - 10 mg total) by mouth every 4 hours as needed for severe pain. 120 tablet 0   prochlorperazine (COMPAZINE) 10 MG tablet Take 1 tablet (10 mg total) by mouth every 6 (six) hours as needed for nausea or vomiting. (Patient not taking: Reported on 01/19/2023) 30 tablet 0   No current facility-administered medications for this encounter.    Physical Findings:  vitals were not taken for this visit.  Pain Assessment Pain Score: 4  Pain Loc: Back (Lower back/ LT thigh)/10 Unable to assess due to telephone follow-up visit  format.  Lab Findings: Lab Results  Component Value Date   WBC 6.6 02/20/2023   HGB 12.6 (L) 02/20/2023   HCT 36.8 (L) 02/20/2023   MCV 99.2 02/20/2023   PLT 194 02/20/2023     Radiographic Findings: MR THORACIC SPINE W WO CONTRAST  Result Date: 03/02/2023 CLINICAL DATA:  Metastatic bone disease. Status post biopsy and kyphoplasty at L2. EXAM: MRI THORACIC AND LUMBAR SPINE WITHOUT AND WITH CONTRAST TECHNIQUE: Multiplanar and multiecho pulse sequences of the thoracic and lumbar spine were obtained without and with intravenous contrast. CONTRAST:  7mL GADAVIST GADOBUTROL 1 MMOL/ML IV SOLN COMPARISON:  MRI thoracic and lumbar spine 11/25/2022. FINDINGS: MRI THORACIC SPINE FINDINGS Alignment:  Normal. Vertebrae: Vertebral body heights are preserved. Background marrow signal is normal. Stir hyperintense, enhancing lesions in the T3 and T9 vertebral bodies measuring approximately 1.0 cm and 0.8 cm, respectively, are not significantly changed since the prior study. No other metastatic lesions are seen in the thoracic spine. There is no evidence of epidural or soft tissue  tumor. There is no evidence of pathologic fracture. Cord:  Normal in signal and morphology without abnormal enhancement. Paraspinal and other soft tissues: Unremarkable. Disc levels: There is overall mild degenerative change of the thoracic spine. A small disc protrusion is again noted at T9-T10. There is no large disc herniation. There is no significant spinal canal or neural foraminal stenosis. MRI LUMBAR SPINE FINDINGS Segmentation: Standard; the lowest formed disc space is designated L5-S1. Alignment: Trace retrolisthesis of L5 on S1 and bony retropulsion of the L2 vertebral body are unchanged. Vertebrae: Again seen is severe compression deformity with vertebral augmentation change at L2. Up to approximately 75% loss of vertebral body height anteriorly is unchanged. There is 5-6 mm bony retropulsion resulting in persistent severe spinal  canal stenosis with compression of the cauda equina nerve roots. Metastatic disease is again seen in the posterior elements of L2. Additional metastatic lesions are seen in the L3 vertebral body eccentric to the right extending to the right pedicle and posterior elements. Mild compression deformity of the superior L3 endplate eccentric to the right is unchanged. Metastatic lesions along the superior L4 endplate, left L4 pedicle, L4 spinous process, L5 spinous process, and along the inferior aspect of the L5 vertebral body are all not significantly changed. There is no definite new osseous metastatic disease. There is no epidural or soft tissue tumor. There is no new pathologic fracture. T1 hyperintensity in the marrow of the lumbar spine likely reflects radiation change. Multiple old rib fractures are noted. Conus medullaris: Extends to the L1-L2 level and appears normal. There is no definite abnormal enhancement of the cauda equina nerve roots. Paraspinal and other soft tissues: Unremarkable. Disc levels: T12-L1: No significant spinal canal or neural foraminal stenosis L1-L2: There is severe spinal canal stenosis due to bony retropulsion of the L2 vertebral body, unchanged. There is no high-grade neural foraminal stenosis L2-L3: There is severe spinal canal stenosis due to the bony retropulsion of the L2 vertebral body, unchanged. There is no high-grade neural foraminal stenosis. L3-L4: There is a mild disc bulge and moderate right and mild left facet arthropathy resulting in mild right and no significant left neural foraminal stenosis and no significant spinal canal stenosis, unchanged. L4-L5: There is a disc bulge eccentric to the left, degenerative endplate spurring, and mild bilateral facet arthropathy resulting in left subarticular zone narrowing and moderate left and no significant right neural foraminal stenosis, unchanged. L5-S1: There is a disc bulge with superimposed broad-based right subarticular zone  protrusion, degenerative endplate spurring, and mild bilateral facet arthropathy resulting in right subarticular zone narrowing with potential irritation of the traversing right S1 nerve root, and no significant neural foraminal stenosis, unchanged. IMPRESSION: 1. Overall unchanged metastatic disease in the thoracic and lumbar spine compared to the MRI from 11/25/2022. No new lesions or epidural soft tissue tumor is identified. 2. Unchanged severe compression deformity status post vertebral augmentation at L2 with 5-6 mm bony retropulsion resulting in persistent severe spinal canal stenosis with compression of the cauda equina nerve roots. 3. No new pathologic fracture. Electronically Signed   By: Lesia Hausen M.D.   On: 03/02/2023 13:54   MR Lumbar Spine W Wo Contrast  Result Date: 03/02/2023 CLINICAL DATA:  Metastatic bone disease. Status post biopsy and kyphoplasty at L2. EXAM: MRI THORACIC AND LUMBAR SPINE WITHOUT AND WITH CONTRAST TECHNIQUE: Multiplanar and multiecho pulse sequences of the thoracic and lumbar spine were obtained without and with intravenous contrast. CONTRAST:  9mL GADAVIST GADOBUTROL 1 MMOL/ML IV SOLN  COMPARISON:  MRI thoracic and lumbar spine 11/25/2022. FINDINGS: MRI THORACIC SPINE FINDINGS Alignment:  Normal. Vertebrae: Vertebral body heights are preserved. Background marrow signal is normal. Stir hyperintense, enhancing lesions in the T3 and T9 vertebral bodies measuring approximately 1.0 cm and 0.8 cm, respectively, are not significantly changed since the prior study. No other metastatic lesions are seen in the thoracic spine. There is no evidence of epidural or soft tissue tumor. There is no evidence of pathologic fracture. Cord:  Normal in signal and morphology without abnormal enhancement. Paraspinal and other soft tissues: Unremarkable. Disc levels: There is overall mild degenerative change of the thoracic spine. A small disc protrusion is again noted at T9-T10. There is no large  disc herniation. There is no significant spinal canal or neural foraminal stenosis. MRI LUMBAR SPINE FINDINGS Segmentation: Standard; the lowest formed disc space is designated L5-S1. Alignment: Trace retrolisthesis of L5 on S1 and bony retropulsion of the L2 vertebral body are unchanged. Vertebrae: Again seen is severe compression deformity with vertebral augmentation change at L2. Up to approximately 75% loss of vertebral body height anteriorly is unchanged. There is 5-6 mm bony retropulsion resulting in persistent severe spinal canal stenosis with compression of the cauda equina nerve roots. Metastatic disease is again seen in the posterior elements of L2. Additional metastatic lesions are seen in the L3 vertebral body eccentric to the right extending to the right pedicle and posterior elements. Mild compression deformity of the superior L3 endplate eccentric to the right is unchanged. Metastatic lesions along the superior L4 endplate, left L4 pedicle, L4 spinous process, L5 spinous process, and along the inferior aspect of the L5 vertebral body are all not significantly changed. There is no definite new osseous metastatic disease. There is no epidural or soft tissue tumor. There is no new pathologic fracture. T1 hyperintensity in the marrow of the lumbar spine likely reflects radiation change. Multiple old rib fractures are noted. Conus medullaris: Extends to the L1-L2 level and appears normal. There is no definite abnormal enhancement of the cauda equina nerve roots. Paraspinal and other soft tissues: Unremarkable. Disc levels: T12-L1: No significant spinal canal or neural foraminal stenosis L1-L2: There is severe spinal canal stenosis due to bony retropulsion of the L2 vertebral body, unchanged. There is no high-grade neural foraminal stenosis L2-L3: There is severe spinal canal stenosis due to the bony retropulsion of the L2 vertebral body, unchanged. There is no high-grade neural foraminal stenosis. L3-L4:  There is a mild disc bulge and moderate right and mild left facet arthropathy resulting in mild right and no significant left neural foraminal stenosis and no significant spinal canal stenosis, unchanged. L4-L5: There is a disc bulge eccentric to the left, degenerative endplate spurring, and mild bilateral facet arthropathy resulting in left subarticular zone narrowing and moderate left and no significant right neural foraminal stenosis, unchanged. L5-S1: There is a disc bulge with superimposed broad-based right subarticular zone protrusion, degenerative endplate spurring, and mild bilateral facet arthropathy resulting in right subarticular zone narrowing with potential irritation of the traversing right S1 nerve root, and no significant neural foraminal stenosis, unchanged. IMPRESSION: 1. Overall unchanged metastatic disease in the thoracic and lumbar spine compared to the MRI from 11/25/2022. No new lesions or epidural soft tissue tumor is identified. 2. Unchanged severe compression deformity status post vertebral augmentation at L2 with 5-6 mm bony retropulsion resulting in persistent severe spinal canal stenosis with compression of the cauda equina nerve roots. 3. No new pathologic fracture. Electronically Signed  By: Lesia Hausen M.D.   On: 03/02/2023 13:54   MR Brain W Wo Contrast  Result Date: 03/02/2023 CLINICAL DATA:  Brain metastases, assess treatment response 3T SRS Protocol EXAM: MRI HEAD WITHOUT AND WITH CONTRAST TECHNIQUE: Multiplanar, multiecho pulse sequences of the brain and surrounding structures were obtained without and with intravenous contrast. CONTRAST:  7mL GADAVIST GADOBUTROL 1 MMOL/ML IV SOLN COMPARISON:  MRI head 11/25/2022. FINDINGS: Brain: Punctate enhancing right cerebellar lesion, similar. No new enhancing lesions identified. Multiple small punctate area of susceptibility artifact, compatible with prior treated metastases and/or chronic microhemorrhages. No evidence of acute  infarct, acute hemorrhage, midline shift or hydrocephalus. Vascular: Major arterial flow voids are maintained. Skull and upper cervical spine: Normal marrow signal. Left posterior upper neck lipoma. Sinuses/Orbits: Mild paranasal sinus mucosal thickening. No acute orbital finding. IMPRESSION: Similar punctate enhancing right cerebellar lesion. No new enhancing metastases identified. Electronically Signed   By: Feliberto Harts M.D.   On: 03/02/2023 13:41    Impression/Plan: 1. 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  He appears to have recovered well from the effects of his recent radiotherapy and is currently without complaints.  His brain disease appears well controlled on recent MRI brain scan. He continues with mild back pain that is well-controlled with pain medications as needed.  We reviewed the results of his recent MRI scans of the thoracic and lumbar spine which show a stable appearance of the metastatic lesions in the thoracic spine at T3 and T9.  Fortunately, these remain small and are not causing him any significant discomfort so he prefers to continue to monitor these on repeat scans.  We did review the potential role for palliative radiotherapy to these areas in the future should they become more bothersome or no longer responding to the pain medication.  At this point, he feels like his pain is well-controlled and he would like to forego any further radiation at present.  I will share our discussion with Dr. Shirline Frees to keep him in the loop and we will plan to continue to monitor the brain and spine with repeat MRI scans in 3 months and a telephone follow-up visit thereafter to review results and recommendations from the multidisciplinary brain and spine conference.  He knows that he is to call and/or seek evaluation immediately should he develop any uncontrolled pain or  neurologic dysfunction as discussed today.  He will continue in routine follow up with Dr. Arbutus Ped as well, for continued management of his systemic disease. He has a good understanding of our recommendations and is comfortable and in agreement with the stated plan.   I personally spent 20 minutes in this encounter including chart review, reviewing radiological studies, telephone conversation with the patient, entering orders and completing documentation.    Marguarite Arbour, PA-C

## 2023-03-08 NOTE — Progress Notes (Signed)
Telephone nursing appointment for patient to review most recent scan results from 03/01/2023. I verified patient's identity and began nursing interview. Patient reports pain in lower back & LT thigh 4/10. No other issues conveyed at this time.   Meaningful use complete.   Patient aware of their 9:30am-03/08/2023 telephone appointment w/ Ashlyn Bruning PA-C. I left my extension [phone] in case patient needs anything. Patient verbalized understanding. This concludes the nursing interview.   Patient contact [phone]     [name], LPN

## 2023-03-09 ENCOUNTER — Ambulatory Visit: Payer: Medicaid Other | Admitting: Pharmacist

## 2023-03-09 ENCOUNTER — Ambulatory Visit: Payer: Medicaid Other | Admitting: Critical Care Medicine

## 2023-03-09 NOTE — Progress Notes (Unsigned)
S:     No chief complaint on file.  57 y.o. male who presents for diabetes evaluation, education, and management.  PMH is significant for adenocarcinoma of the lung undergoing treatment, T2DM.  Patient was referred and last seen by Primary Care Provider, Dr. Delford Field, on 01/19/2023. At last visit, reported BG 250-300s. Novolog 70/30 was increased from 10 units BID to 25 units BID and started jardiance 10 mg daily.   Today, patient arrives in *** good spirits and presents without *** any assistance. ***Patient was previously on Victoza and was unable to tolerate ***. Was also previously on glimepiride, Levemir, and metformin.   Patient reports Diabetes was diagnosed in ***.   Family/Social History:  Tobacco use ***  Current diabetes medications include: Jardiance 10 mg daily, Novolog 70/30 25 units BID Current hyperlipidemia medications include: atorvastatin 40 mg   Patient reports adherence to taking all medications as prescribed.  *** Patient denies adherence with medications, reports missing *** medications *** times per week, on average.  Insurance coverage: Medicaid  Patient {Actions; denies-reports:120008} hypoglycemic events.  Reported home fasting blood sugars: ***  Reported 2 hour post-meal/random blood sugars: ***.  Patient {Actions; denies-reports:120008} nocturia (nighttime urination).  Patient {Actions; denies-reports:120008} neuropathy (nerve pain). Patient {Actions; denies-reports:120008} visual changes. Patient {Actions; denies-reports:120008} self foot exams.   Patient reported dietary habits: Eats *** meals/day  Patient-reported exercise habits: ***   O:   7 day average blood glucose: ***  *** CGM Download:  % Time CGM is active: ***% Average Glucose: *** mg/dL Glucose Management Indicator: ***  Glucose Variability: *** (goal <36%) Time in Goal:  - Time in range 70-180: ***% - Time above range: ***% - Time below range: ***% Observed  patterns:   Lab Results  Component Value Date   HGBA1C 13.0 (H) 07/29/2022   There were no vitals filed for this visit.  Lipid Panel     Component Value Date/Time   CHOL 116 01/19/2023 0937   TRIG 76 01/19/2023 0937   HDL 40 01/19/2023 0937   CHOLHDL 2.9 01/19/2023 0937   CHOLHDL 2.4 03/11/2021 1843   VLDL 23 03/11/2021 1843   LDLCALC 61 01/19/2023 0937    Clinical Atherosclerotic Cardiovascular Disease (ASCVD): {YES/NO:21197} ***  The ASCVD Risk score (Arnett DK, et al., 2019) failed to calculate for the following reasons:   The patient has a prior MI or stroke diagnosis   Patient is participating in a Managed Medicaid Plan:  {MM YES/NO:27447::"Yes"}   A/P: Diabetes longstanding currently uncontrolled based on A1c. Patient is  able to verbalize appropriate hypoglycemia management plan. Medication adherence appears ***. Control is suboptimal due to ***.  -{Meds adjust:18428} insulin *** Novolog 70/30 (insulin aspart protamine-aspart) from *** to ***.  -{Meds adjust:18428} SGLT2-I *** Farxiga (dapagliflozin) *** Jardiance (empagliflozin) 10 mg. Counseled on sick day rules. -Patient educated on purpose, proper use, and potential adverse effects of ***.  -Extensively discussed pathophysiology of diabetes, recommended lifestyle interventions, dietary effects on blood sugar control.  -Counseled on s/sx of and management of hypoglycemia.  -Next A1c anticipated ***.   ASCVD risk - primary ***secondary prevention in patient with diabetes. Last LDL is *** not at goal of <65 *** mg/dL. ASCVD risk factors include *** and 10-year ASCVD risk score of ***. {Desc; low/moderate/high:110033} intensity statin indicated.  -{Meds adjust:18428} ***statin *** mg.   Hypertension longstanding *** currently ***. Blood pressure goal of <130/80 *** mmHg. Medication adherence ***. Blood pressure control is suboptimal due to ***. -{Meds adjust:18428} ***  mg.  Written patient instructions provided.  Patient verbalized understanding of treatment plan.  Total time in face to face counseling *** minutes.    Follow-up:  Pharmacist ***. PCP clinic visit in ***.  Patient seen with ***

## 2023-03-10 ENCOUNTER — Ambulatory Visit (HOSPITAL_COMMUNITY)
Admission: RE | Admit: 2023-03-10 | Discharge: 2023-03-10 | Disposition: A | Discharge status: Home / Self Care | Payer: Medicaid Other | Source: Ambulatory Visit | Attending: Internal Medicine | Admitting: Internal Medicine

## 2023-03-10 DIAGNOSIS — C3490 Malignant neoplasm of unspecified part of unspecified bronchus or lung: Secondary | ICD-10-CM | POA: Insufficient documentation

## 2023-03-10 MED ORDER — SODIUM CHLORIDE (PF) 0.9 % IJ SOLN
INTRAMUSCULAR | Status: AC
  Filled 2023-03-10: qty 50

## 2023-03-10 MED ORDER — IOHEXOL 300 MG/ML  SOLN
100.0000 mL | Freq: Once | INTRAMUSCULAR | Status: AC | PRN
  Administered 2023-03-10: 100 mL via INTRAVENOUS

## 2023-03-10 NOTE — Progress Notes (Unsigned)
Palliative Medicine [facility]  [phone] [phone]   Name: Samuel [name] Date: 03/10/2023 MRN: [id number]  DOB: [birth year]  Patient Care Team: [name], MD as PCP - General (Pulmonary Disease) Rema Fendt, NP as Nurse Practitioner (Nurse Practitioner) Valerie Roys, Arty Baumgartner, NP as Nurse Practitioner (Nurse Practitioner)    INTERVAL HISTORY: Samuel [name] is a 57 y.o. 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  Palliative ask to see for symptom management and goals of care.   SOCIAL HISTORY:     reports that he has been smoking cigarettes. He has been smoking an average of 1 pack per day. He has never used smokeless tobacco. He reports current alcohol use of about 50.0 standard drinks of alcohol per week. He reports that he does not use drugs.  ADVANCE DIRECTIVES:  Patient does not have an advanced directives. Education and packet provided. He has expressed interest in completing. States his sister, Samuel [name] would be his designated Clinical research associate.   CODE STATUS: Full code  PAST MEDICAL HISTORY: Past Medical History:  Diagnosis Date   Alcohol use disorder, severe, dependence 12/26/2016   Diabetes mellitus without complication    Metastatic cancer to brain 08/05/2022   Metastatic cancer to spine 07/19/2022   Pancreatitis unk   Primary lung adenocarcinoma 08/05/2022   Suicidal ideation    Type 2 diabetes mellitus with hyperglycemia, without long-term current use of insulin 06/13/2022   Formatting of this note might be different from the original. 06/13/2022 A1C 13.8, FSBG 414 Start empagliflozin /metformin 1000 daily, levemir 20 qhs. (Samples given) Will apply for medassist for pharmacy    ALLERGIES:  is allergic to penicillins and percocet  [oxycodone-acetaminophen].  MEDICATIONS:  Current Outpatient Medications  Medication Sig Dispense Refill   Accu-Chek Softclix Lancets lancets Use to check blood sugar 3 times daily. E11.69 100 each 6   Blood Glucose Monitoring Suppl (ACCU-CHEK GUIDE) w/Device KIT Use to check blood sugar 3 times daily. E11.69 1 kit 0   glucose blood (ACCU-CHEK GUIDE) test strip Use to check blood sugar 3 times daily. E11.69 100 each 6   acetaminophen (TYLENOL) 500 MG tablet Take 1,000 mg by mouth every 6 (six) hours as needed for mild pain or moderate pain.     atorvastatin (LIPITOR) 40 MG tablet Take 1 tablet (40 mg total) by mouth daily. 30 tablet 2   blood glucose meter kit and supplies KIT Dispense based on patient and insurance preference. Use up to four times daily as directed. 1 each 0   empagliflozin (JARDIANCE) 10 MG TABS tablet Take 1 tablet (10 mg total) by mouth daily before breakfast. 60 tablet 4   folic acid (FOLVITE) 1 MG tablet Take 1 tablet (1 mg total) by mouth daily. 30 tablet 4   gabapentin (NEURONTIN) 300 MG capsule Take 2 capsules (600 mg total) by mouth 3 (three) times daily. 180 capsule 1   insulin aspart protamine - aspart (NOVOLOG MIX 70/30 FLEXPEN) (70-30) 100 UNIT/ML FlexPen Inject 25 Units into the skin 2 (two) times daily with a meal. 15 mL 5   Insulin Pen Needle 32G X 4 MM MISC Use with Insulin pen 100 each 3   ipratropium-albuterol (DUONEB) 0.5-2.5 (3) MG/3ML SOLN Take 3 mLs by nebulization every 6 (six) hours as needed (Shortness of breath). 360 mL 0   lidocaine-prilocaine (  EMLA) cream Apply to the Port-A-Cath site 30-60 minutes before chemo. (Patient not taking: Reported on 01/19/2023) 30 g 0   morphine (MS CONTIN) 15 MG 12 hr tablet Take 1 tablet (15 mg total) by mouth every 12 (twelve) hours. 60 tablet 0   oxyCODONE (OXY IR/ROXICODONE) 5 MG immediate release tablet Take 1 - 2 tablets (5 - 10 mg total) by mouth every 4 hours as needed for severe pain. 120 tablet 0    prochlorperazine (COMPAZINE) 10 MG tablet Take 1 tablet (10 mg total) by mouth every 6 (six) hours as needed for nausea or vomiting. (Patient not taking: Reported on 01/19/2023) 30 tablet 0   No current facility-administered medications for this visit.    VITAL SIGNS: There were no vitals taken for this visit. There were no vitals filed for this visit.  Estimated body mass index is 23.26 kg/m as calculated from the following:   Height as of 11/07/22:  (1.727 m).   Weight as of 02/20/23: 153 lb (69.4 kg).   PERFORMANCE STATUS (ECOG) : 1 - Symptomatic but completely ambulatory   Physical Exam General: NAD Cardiovascular: regular rate and rhythm Pulmonary:normal breathing pattern  Extremities: no edema, no joint deformities, brace in place  Skin: no rashes Neurological: AAO x4  IMPRESSION: I saw Samuel [name] during his infusion.  No acute distress noted.  No family present.  Overall patient is doing well.  Is trying to remain as active as possible.  Samuel [name] denies nausea, vomiting, constipation, or diarrhea.  Neoplasm related pain Samuel [name] reports pain is well controlled on current regimen.  We have been working over the past month to wean down off of his MS Contin which has been successful.  He is now at the point this has been discontinued appropriately.  Continues to take oxycodone 10 mg 2-3 times daily. Some days are better than others. He is taking responsibly with where appropriate refill request.  Does not require breakthrough medication around-the-clock.  Will continue to closely monitor and adjust as needed.  Pain contract on file.    2.  Appetite Continues to improve.  Current weight today is 147 pounds.  Expresses appreciation of being able to eat foods that he wants enjoyed.  PLAN: MS Contin to be discontinued.  Patient has successfully weaned appropriately. Oxy IR 10 mg every 6 hours as needed for breakthrough pain.  Is only taken 1-2 times daily. Colace twice  daily Narcan for emergency use Appetite much improved.Weight is increasing. Pain contract signed on 10/23. I will plan to see patient back in 3-4 weeks in collaboration with his other oncology appointments.   Patient expressed understanding and was in agreement with this plan. He also understands that He can call the clinic at any time with any questions, concerns, or complaints.       Any controlled substances utilized were prescribed in the context of palliative care. PDMP has been reviewed.    Visit consisted of counseling and education dealing with the complex and emotionally intense issues of symptom management and palliative care in the setting of serious and potentially life-threatening illness.Greater than 50%  of this time was spent counseling and coordinating care related to the above assessment and plan.  Willette Alma, AGPCNP-BC  Palliative Medicine Team/[facility]  *Please note that this is a verbal dictation therefore any spelling or grammatical errors are due to the "Dragon Medical One" system interpretation.

## 2023-03-13 ENCOUNTER — Inpatient Hospital Stay (HOSPITAL_BASED_OUTPATIENT_CLINIC_OR_DEPARTMENT_OTHER): Payer: Medicaid Other | Admitting: Nurse Practitioner

## 2023-03-13 ENCOUNTER — Inpatient Hospital Stay: Payer: Medicaid Other

## 2023-03-13 ENCOUNTER — Inpatient Hospital Stay (HOSPITAL_BASED_OUTPATIENT_CLINIC_OR_DEPARTMENT_OTHER): Payer: Medicaid Other | Admitting: Internal Medicine

## 2023-03-13 ENCOUNTER — Other Ambulatory Visit: Payer: Self-pay

## 2023-03-13 ENCOUNTER — Encounter: Payer: Self-pay | Admitting: Internal Medicine

## 2023-03-13 ENCOUNTER — Encounter: Payer: Self-pay | Admitting: Nurse Practitioner

## 2023-03-13 ENCOUNTER — Other Ambulatory Visit (HOSPITAL_COMMUNITY): Payer: Self-pay

## 2023-03-13 VITALS — BP 118/80 | HR 76 | Temp 97.9°F | Resp 14 | Wt 147.3 lb

## 2023-03-13 VITALS — BP 117/66 | HR 90 | Resp 16

## 2023-03-13 DIAGNOSIS — C3490 Malignant neoplasm of unspecified part of unspecified bronchus or lung: Secondary | ICD-10-CM

## 2023-03-13 DIAGNOSIS — Z5112 Encounter for antineoplastic immunotherapy: Secondary | ICD-10-CM | POA: Diagnosis not present

## 2023-03-13 DIAGNOSIS — G893 Neoplasm related pain (acute) (chronic): Secondary | ICD-10-CM | POA: Diagnosis not present

## 2023-03-13 DIAGNOSIS — C7951 Secondary malignant neoplasm of bone: Secondary | ICD-10-CM | POA: Diagnosis not present

## 2023-03-13 DIAGNOSIS — M792 Neuralgia and neuritis, unspecified: Secondary | ICD-10-CM

## 2023-03-13 DIAGNOSIS — Z515 Encounter for palliative care: Secondary | ICD-10-CM

## 2023-03-13 LAB — CBC WITH DIFFERENTIAL (CANCER CENTER ONLY)
Abs Immature Granulocytes: 0.02 10*3/uL (ref 0.00–0.07)
Basophils Absolute: 0 10*3/uL (ref 0.0–0.1)
Basophils Relative: 0 %
Eosinophils Absolute: 0.1 10*3/uL (ref 0.0–0.5)
Eosinophils Relative: 1 %
HCT: 40.9 % (ref 39.0–52.0)
Hemoglobin: 13.8 g/dL (ref 13.0–17.0)
Immature Granulocytes: 0 %
Lymphocytes Relative: 16 %
Lymphs Abs: 0.9 10*3/uL (ref 0.7–4.0)
MCH: 33.4 pg (ref 26.0–34.0)
MCHC: 33.7 g/dL (ref 30.0–36.0)
MCV: 99 fL (ref 80.0–100.0)
Monocytes Absolute: 0.4 10*3/uL (ref 0.1–1.0)
Monocytes Relative: 8 %
Neutro Abs: 4.4 10*3/uL (ref 1.7–7.7)
Neutrophils Relative %: 75 %
Platelet Count: 200 10*3/uL (ref 150–400)
RBC: 4.13 MIL/uL — ABNORMAL LOW (ref 4.22–5.81)
RDW: 13 % (ref 11.5–15.5)
WBC Count: 5.9 10*3/uL (ref 4.0–10.5)
nRBC: 0 % (ref 0.0–0.2)

## 2023-03-13 LAB — CMP (CANCER CENTER ONLY)
ALT: 32 U/L (ref 0–44)
AST: 25 U/L (ref 15–41)
Albumin: 4 g/dL (ref 3.5–5.0)
Alkaline Phosphatase: 76 U/L (ref 38–126)
Anion gap: 6 (ref 5–15)
BUN: 12 mg/dL (ref 6–20)
CO2: 26 mmol/L (ref 22–32)
Calcium: 9.5 mg/dL (ref 8.9–10.3)
Chloride: 106 mmol/L (ref 98–111)
Creatinine: 0.58 mg/dL — ABNORMAL LOW (ref 0.61–1.24)
GFR, Estimated: >60 mL/min (ref >60)
Glucose, Bld: 210 mg/dL — ABNORMAL HIGH (ref 70–99)
Potassium: 3.4 mmol/L — ABNORMAL LOW (ref 3.5–5.1)
Sodium: 138 mmol/L (ref 135–145)
Total Bilirubin: 0.3 mg/dL (ref 0.3–1.2)
Total Protein: 7.5 g/dL (ref 6.5–8.1)

## 2023-03-13 MED ORDER — HEPARIN SOD (PORK) LOCK FLUSH 100 UNIT/ML IV SOLN
500.0000 [IU] | Freq: Once | INTRAVENOUS | Status: AC | PRN
  Administered 2023-03-13: 500 [IU]

## 2023-03-13 MED ORDER — PROCHLORPERAZINE MALEATE 10 MG PO TABS
10.0000 mg | ORAL_TABLET | Freq: Once | ORAL | Status: AC
  Administered 2023-03-13: 10 mg via ORAL
  Filled 2023-03-13: qty 1

## 2023-03-13 MED ORDER — TIZANIDINE HCL 2 MG PO TABS
2.0000 mg | ORAL_TABLET | Freq: Every day | ORAL | 1 refills | Status: DC
Start: 2023-03-13 — End: 2023-04-05
  Filled 2023-03-13: qty 30, 30d supply, fill #0

## 2023-03-13 MED ORDER — SODIUM CHLORIDE 0.9 % IV SOLN: Freq: Once | INTRAVENOUS | Status: AC

## 2023-03-13 MED ORDER — OXYCODONE HCL 10 MG PO TABS
10.0000 mg | ORAL_TABLET | Freq: Four times a day (QID) | ORAL | 0 refills | Status: DC | PRN
Start: 2023-03-13 — End: 2023-04-05
  Filled 2023-03-13: qty 60, 15d supply, fill #0

## 2023-03-13 MED ORDER — SODIUM CHLORIDE 0.9% FLUSH
10.0000 mL | INTRAVENOUS | Status: DC | PRN
  Administered 2023-03-13: 10 mL

## 2023-03-13 MED ORDER — SODIUM CHLORIDE 0.9 % IV SOLN
500.0000 mg/m2 | Freq: Once | INTRAVENOUS | Status: AC
  Administered 2023-03-13: 900 mg via INTRAVENOUS
  Filled 2023-03-13: qty 20

## 2023-03-13 MED ORDER — SODIUM CHLORIDE 0.9 % IV SOLN
200.0000 mg | Freq: Once | INTRAVENOUS | Status: AC
  Administered 2023-03-13: 200 mg via INTRAVENOUS
  Filled 2023-03-13: qty 200

## 2023-03-13 NOTE — Patient Instructions (Signed)
Steps to Quit Smoking Smoking tobacco is the leading cause of preventable death. It can affect almost every organ in the body. Smoking puts you and people around you at risk for many serious, long-lasting (chronic) diseases. Quitting smoking can be hard, but it is one of the best things that you can do for your health. It is never too late to quit. Do not give up if you cannot quit the first time. Some people need to try many times to quit. Do your best to stick to your quit plan, and talk with your doctor if you have any questions or concerns. How do I get ready to quit? Pick a date to quit. Set a date within the next 2 weeks to give you time to prepare. Write down the reasons why you are quitting. Keep this list in places where you will see it often. Tell your family, friends, and co-workers that you are quitting. Their support is important. Talk with your doctor about the choices that may help you quit. Find out if your health insurance will pay for these treatments. Know the people, places, things, and activities that make you want to smoke (triggers). Avoid them. What first steps can I take to quit smoking? Throw away all cigarettes at home, at work, and in your car. Throw away the things that you use when you smoke, such as ashtrays and lighters. Clean your car. Empty the ashtray. Clean your home, including curtains and carpets. What can I do to help me quit smoking? Talk with your doctor about taking medicines and seeing a counselor. You are more likely to succeed when you do both. If you are pregnant or breastfeeding: Talk with your doctor about counseling or other ways to quit smoking. Do not take medicine to help you quit smoking unless your doctor tells you to. Quit right away Quit smoking completely, instead of slowly cutting back on how much you smoke over a period of time. Stopping smoking right away may be more successful than slowly quitting. Go to counseling. In-person is best  if this is an option. You are more likely to quit if you go to counseling sessions regularly. Take medicine You may take medicines to help you quit. Some medicines need a prescription, and some you can buy over-the-counter. Some medicines may contain a drug called nicotine to replace the nicotine in cigarettes. Medicines may: Help you stop having the desire to smoke (cravings). Help to stop the problems that come when you stop smoking (withdrawal symptoms). Your doctor may ask you to use: Nicotine patches, gum, or lozenges. Nicotine inhalers or sprays. Non-nicotine medicine that you take by mouth. Find resources Find resources and other ways to help you quit smoking and remain smoke-free after you quit. They include: Online chats with a counselor. Phone quitlines. Printed self-help materials. Support groups or group counseling. Text messaging programs. Mobile phone apps. Use apps on your mobile phone or tablet that can help you stick to your quit plan. Examples of free services include Quit Guide from the CDC and smokefree.gov  What can I do to make it easier to quit?  Talk to your family and friends. Ask them to support and encourage you. Call a phone quitline, such as 1-800-QUIT-NOW, reach out to support groups, or work with a counselor. Ask people who smoke to not smoke around you. Avoid places that make you want to smoke, such as: Bars. Parties. Smoke-break areas at work. Spend time with people who do not smoke. Lower   the stress in your life. Stress can make you want to smoke. Try these things to lower stress: Getting regular exercise. Doing deep-breathing exercises. Doing yoga. Meditating. What benefits will I see if I quit smoking? Over time, you may have: A better sense of smell and taste. Less coughing and sore throat. A slower heart rate. Lower blood pressure. Clearer skin. Better breathing. Fewer sick days. Summary Quitting smoking can be hard, but it is one of  the best things that you can do for your health. Do not give up if you cannot quit the first time. Some people need to try many times to quit. When you decide to quit smoking, make a plan to help you succeed. Quit smoking right away, not slowly over a period of time. When you start quitting, get help and support to keep you smoke-free. This information is not intended to replace advice given to you by your health care provider. Make sure you discuss any questions you have with your health care provider. Document Revised: 10/29/2021 Document Reviewed: 10/29/2021 [employer] Patient Education  2023 [employer]  

## 2023-03-13 NOTE — Patient Instructions (Signed)
[facility]  Discharge Instructions: Thank you for choosing [facility] to provide your oncology and hematology care.   If you have a lab appointment with the Cancer Center, please go directly to the Cancer Center and check in at the registration area.   Wear comfortable clothing and clothing appropriate for easy access to any Portacath or PICC line.   We strive to give you quality time with your provider. You may need to reschedule your appointment if you arrive late (15 or more minutes).  Arriving late affects you and other patients whose appointments are after yours.  Also, if you miss three or more appointments without notifying the office, you may be dismissed from the clinic at the provider's discretion.      For prescription refill requests, have your pharmacy contact our office and allow 72 hours for refills to be completed.    Today you received the following chemotherapy and/or immunotherapy agents: Keytruda and Alimta      To help prevent nausea and vomiting after your treatment, we encourage you to take your nausea medication as directed.  BELOW ARE SYMPTOMS THAT SHOULD BE REPORTED IMMEDIATELY: *FEVER GREATER THAN 100.4 F (38 C) OR HIGHER *CHILLS OR SWEATING *NAUSEA AND VOMITING THAT IS NOT CONTROLLED WITH YOUR NAUSEA MEDICATION *UNUSUAL SHORTNESS OF BREATH *UNUSUAL BRUISING OR BLEEDING *URINARY PROBLEMS (pain or burning when urinating, or frequent urination) *BOWEL PROBLEMS (unusual diarrhea, constipation, pain near the anus) TENDERNESS IN MOUTH AND THROAT WITH OR WITHOUT PRESENCE OF ULCERS (sore throat, sores in mouth, or a toothache) UNUSUAL RASH, SWELLING OR PAIN  UNUSUAL VAGINAL DISCHARGE OR ITCHING   Items with * indicate a potential emergency and should be followed up as soon as possible or go to the Emergency Department if any problems should occur.  Please show the CHEMOTHERAPY ALERT CARD or IMMUNOTHERAPY ALERT  CARD at check-in to the Emergency Department and triage nurse.  Should you have questions after your visit or need to cancel or reschedule your appointment, please contact [facility]  Dept: [phone]  and follow the prompts.  Office hours are 8:00 a.m. to 4:30 p.m. Monday - Friday. Please note that voicemails left after 4:00 p.m. may not be returned until the following business day.  We are closed weekends and major holidays. You have access to a nurse at all times for urgent questions. Please call the main number to the clinic Dept: [phone] and follow the prompts.   For any non-urgent questions, you may also contact your provider using MyChart. We now offer e-Visits for anyone 18 and older to request care online for non-urgent symptoms. For details visit [email or web address].   Also download the MyChart app! Go to the app store, search "MyChart", open the app, select , and log in with your MyChart username and password.   

## 2023-03-13 NOTE — Progress Notes (Signed)
[facility] [phone]   [phone]  OFFICE PROGRESS NOTE  [name], MD 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  Biomarker Findings Microsatellite status - MS-Stable Tumor Mutational Burden - 8 Muts/Mb Genomic Findings For a complete list of the genes assayed, please refer to the Appendix. KEAP1 Q227* CDKN2A/B [id number] G89V PRKCI amplification TERC amplification - equivocal? TP53 [id number]*18 8 Disease relevant genes with no reportable alterations: ALK, BRAF, EGFR, ERBB2, KRAS, MET, RET, ROS1  PDL1 TPS  90%  PRIOR THERAPY:  1) Status post palliative radiotherapy to the L2 lesion under the care of Dr. Kathrynn Running. 2) Status post SRS to solitary brain metastasis under the care of Dr. Kathrynn Running  CURRENT THERAPY: Palliative systemic therapy with carboplatin for AUC of 5, Alimta 500 Mg/M2 and Keytruda 200 Mg IV every 3 weeks.  First dose September 05, 2022.  Status post 9 cycles.  Starting from cycle #5 the patient will be on maintenance treatment with Alimta and Keytruda every 3 weeks.  INTERVAL HISTORY: Samuel MEMBRENO [name] 57 y.o. male returns to the clinic today for follow-up visit.  The patient is feeling fine today with no concerning complaints except for muscle cramps but no significant back pain.  He denied having any chest pain, shortness of breath, cough or hemoptysis.  He denied having any fever or chills.  He has no nausea, vomiting, diarrhea or constipation.  He has no headache or visual changes.  He has no fever or chills.  He has been tolerating his  maintenance treatment with Alimta and Keytruda fairly well.  He is here today for evaluation with repeat CT scan of the chest, abdomen and pelvis for restaging of his disease.   MEDICAL HISTORY: Past Medical History:  Diagnosis Date   Alcohol use disorder, severe, dependence 12/26/2016   Diabetes mellitus without complication    Metastatic cancer to brain 08/05/2022   Metastatic cancer to spine 07/19/2022   Pancreatitis unk   Primary lung adenocarcinoma 08/05/2022   Suicidal ideation    Type 2 diabetes mellitus with hyperglycemia, without long-term current use of insulin 06/13/2022   Formatting of this note might be different from the original. 06/13/2022 A1C 13.8, FSBG 414 Start empagliflozin /metformin 1000 daily, levemir 20 qhs. (Samples given) Will apply for medassist for pharmacy    ALLERGIES:  is allergic to penicillins and percocet [oxycodone-acetaminophen].  MEDICATIONS:  Current Outpatient Medications  Medication Sig Dispense Refill   Accu-Chek Softclix Lancets lancets Use to check blood sugar 3 times daily. E11.69 100 each 6   atorvastatin (LIPITOR) 40 MG tablet Take 1 tablet (40 mg total) by mouth daily. 30 tablet 2   blood glucose meter kit and supplies KIT Dispense based on patient and insurance preference. Use up to four times daily as directed. 1 each 0   Blood Glucose Monitoring Suppl (ACCU-CHEK GUIDE) w/Device KIT Use to check blood sugar 3 times daily. E11.69 1 kit 0   empagliflozin (JARDIANCE) 10 MG TABS tablet Take 1 tablet (10 mg total) by mouth daily before breakfast. 60 tablet 4   folic acid (FOLVITE) 1 MG tablet Take 1  tablet (1 mg total) by mouth daily. 30 tablet 4   gabapentin (NEURONTIN) 300 MG capsule Take 2 capsules (600 mg total) by mouth 3 (three) times daily. 180 capsule 1   glucose blood (ACCU-CHEK GUIDE) test strip Use to check blood sugar 3 times daily. E11.69 100 each 6   insulin aspart protamine - aspart (NOVOLOG MIX 70/30 FLEXPEN) (70-30) 100  UNIT/ML FlexPen Inject 25 Units into the skin 2 (two) times daily with a meal. 15 mL 5   Insulin Pen Needle 32G X 4 MM MISC Use with Insulin pen 100 each 3   ipratropium-albuterol (DUONEB) 0.5-2.5 (3) MG/3ML SOLN Take 3 mLs by nebulization every 6 (six) hours as needed (Shortness of breath). 360 mL 0   lidocaine-prilocaine (EMLA) cream Apply to the Port-A-Cath site 30-60 minutes before chemo. 30 g 0   oxyCODONE (OXY IR/ROXICODONE) 5 MG immediate release tablet Take 1 - 2 tablets (5 - 10 mg total) by mouth every 4 hours as needed for severe pain. 120 tablet 0   acetaminophen (TYLENOL) 500 MG tablet Take 1,000 mg by mouth every 6 (six) hours as needed for mild pain or moderate pain. (Patient not taking: Reported on 03/13/2023)     morphine (MS CONTIN) 15 MG 12 hr tablet Take 1 tablet (15 mg total) by mouth every 12 (twelve) hours. (Patient not taking: Reported on 03/13/2023) 60 tablet 0   prochlorperazine (COMPAZINE) 10 MG tablet Take 1 tablet (10 mg total) by mouth every 6 (six) hours as needed for nausea or vomiting. (Patient not taking: Reported on 01/19/2023) 30 tablet 0   No current facility-administered medications for this visit.    SURGICAL HISTORY:  Past Surgical History:  Procedure Laterality Date   IR BONE TUMOR(S)RF ABLATION  08/01/2022   IR IMAGING GUIDED PORT INSERTION  09/21/2022   IR KYPHO LUMBAR INC FX REDUCE BONE BX UNI/BIL CANNULATION INC/IMAGING  08/01/2022    REVIEW OF SYSTEMS:  Constitutional: negative Eyes: negative Ears, nose, mouth, throat, and face: negative Respiratory: negative Cardiovascular: negative Gastrointestinal: negative Genitourinary:negative Integument/breast: negative Hematologic/lymphatic: negative Musculoskeletal:positive for myalgias Neurological: negative Behavioral/Psych: negative Endocrine: negative Allergic/Immunologic: negative   PHYSICAL EXAMINATION: General appearance: alert, cooperative, fatigued, and no distress Head: Normocephalic,  without obvious abnormality, atraumatic Neck: no adenopathy, no JVD, supple, symmetrical, trachea midline, and thyroid not enlarged, symmetric, no tenderness/mass/nodules Lymph nodes: Cervical, supraclavicular, and axillary nodes normal. Resp: clear to auscultation bilaterally Back: symmetric, no curvature. ROM normal. No CVA tenderness. Cardio: regular rate and rhythm, S1, S2 normal, no murmur, click, rub or gallop GI: soft, non-tender; bowel sounds normal; no masses,  no organomegaly Extremities: extremities normal, atraumatic, no cyanosis or edema Neurologic: Alert and oriented X 3, normal strength and tone. Normal symmetric reflexes. Normal coordination and gait  ECOG PERFORMANCE STATUS: 1 - Symptomatic but completely ambulatory  Blood pressure 118/80, pulse 76, temperature 97.9 F (36.6 C), temperature source Oral, resp. rate 14, weight 147 lb 4.8 oz (66.8 kg), SpO2 99 %.  LABORATORY DATA: Lab Results  Component Value Date   WBC 5.9 03/13/2023   HGB 13.8 03/13/2023   HCT 40.9 03/13/2023   MCV 99.0 03/13/2023   PLT 200 03/13/2023      Chemistry      Component Value Date/Time   NA 138 03/13/2023 1055   NA 140 12/24/2020 1144   NA 142 08/02/2014 1649   K 3.4 (L) 03/13/2023 1055   K 4.2 08/02/2014 1649   CL 106 03/13/2023 1055   CL 109 (  H) 08/02/2014 1649   CO2 26 03/13/2023 1055   CO2 21 08/02/2014 1649   BUN 12 03/13/2023 1055   BUN 7 12/24/2020 1144   BUN 11 08/02/2014 1649   CREATININE 0.58 (L) 03/13/2023 1055   CREATININE 0.92 08/02/2014 1649      Component Value Date/Time   CALCIUM 9.5 03/13/2023 1055   CALCIUM 8.6 08/02/2014 1649   ALKPHOS 76 03/13/2023 1055   ALKPHOS 82 08/02/2014 1649   AST 25 03/13/2023 1055   ALT 32 03/13/2023 1055   ALT 132 (H) 08/02/2014 1649   BILITOT 0.3 03/13/2023 1055       RADIOGRAPHIC STUDIES: MR THORACIC SPINE W WO CONTRAST  Result Date: 03/02/2023 CLINICAL DATA:  Metastatic bone disease. Status post biopsy and  kyphoplasty at L2. EXAM: MRI THORACIC AND LUMBAR SPINE WITHOUT AND WITH CONTRAST TECHNIQUE: Multiplanar and multiecho pulse sequences of the thoracic and lumbar spine were obtained without and with intravenous contrast. CONTRAST:  7mL GADAVIST GADOBUTROL 1 MMOL/ML IV SOLN COMPARISON:  MRI thoracic and lumbar spine 11/25/2022. FINDINGS: MRI THORACIC SPINE FINDINGS Alignment:  Normal. Vertebrae: Vertebral body heights are preserved. Background marrow signal is normal. Stir hyperintense, enhancing lesions in the T3 and T9 vertebral bodies measuring approximately 1.0 cm and 0.8 cm, respectively, are not significantly changed since the prior study. No other metastatic lesions are seen in the thoracic spine. There is no evidence of epidural or soft tissue tumor. There is no evidence of pathologic fracture. Cord:  Normal in signal and morphology without abnormal enhancement. Paraspinal and other soft tissues: Unremarkable. Disc levels: There is overall mild degenerative change of the thoracic spine. A small disc protrusion is again noted at T9-T10. There is no large disc herniation. There is no significant spinal canal or neural foraminal stenosis. MRI LUMBAR SPINE FINDINGS Segmentation: Standard; the lowest formed disc space is designated L5-S1. Alignment: Trace retrolisthesis of L5 on S1 and bony retropulsion of the L2 vertebral body are unchanged. Vertebrae: Again seen is severe compression deformity with vertebral augmentation change at L2. Up to approximately 75% loss of vertebral body height anteriorly is unchanged. There is 5-6 mm bony retropulsion resulting in persistent severe spinal canal stenosis with compression of the cauda equina nerve roots. Metastatic disease is again seen in the posterior elements of L2. Additional metastatic lesions are seen in the L3 vertebral body eccentric to the right extending to the right pedicle and posterior elements. Mild compression deformity of the superior L3 endplate  eccentric to the right is unchanged. Metastatic lesions along the superior L4 endplate, left L4 pedicle, L4 spinous process, L5 spinous process, and along the inferior aspect of the L5 vertebral body are all not significantly changed. There is no definite new osseous metastatic disease. There is no epidural or soft tissue tumor. There is no new pathologic fracture. T1 hyperintensity in the marrow of the lumbar spine likely reflects radiation change. Multiple old rib fractures are noted. Conus medullaris: Extends to the L1-L2 level and appears normal. There is no definite abnormal enhancement of the cauda equina nerve roots. Paraspinal and other soft tissues: Unremarkable. Disc levels: T12-L1: No significant spinal canal or neural foraminal stenosis L1-L2: There is severe spinal canal stenosis due to bony retropulsion of the L2 vertebral body, unchanged. There is no high-grade neural foraminal stenosis L2-L3: There is severe spinal canal stenosis due to the bony retropulsion of the L2 vertebral body, unchanged. There is no high-grade neural foraminal stenosis. L3-L4: There is a mild disc bulge and  moderate right and mild left facet arthropathy resulting in mild right and no significant left neural foraminal stenosis and no significant spinal canal stenosis, unchanged. L4-L5: There is a disc bulge eccentric to the left, degenerative endplate spurring, and mild bilateral facet arthropathy resulting in left subarticular zone narrowing and moderate left and no significant right neural foraminal stenosis, unchanged. L5-S1: There is a disc bulge with superimposed broad-based right subarticular zone protrusion, degenerative endplate spurring, and mild bilateral facet arthropathy resulting in right subarticular zone narrowing with potential irritation of the traversing right S1 nerve root, and no significant neural foraminal stenosis, unchanged. IMPRESSION: 1. Overall unchanged metastatic disease in the thoracic and lumbar  spine compared to the MRI from 11/25/2022. No new lesions or epidural soft tissue tumor is identified. 2. Unchanged severe compression deformity status post vertebral augmentation at L2 with 5-6 mm bony retropulsion resulting in persistent severe spinal canal stenosis with compression of the cauda equina nerve roots. 3. No new pathologic fracture. Electronically Signed   By: Lesia Hausen M.D.   On: 03/02/2023 13:54   MR Lumbar Spine W Wo Contrast  Result Date: 03/02/2023 CLINICAL DATA:  Metastatic bone disease. Status post biopsy and kyphoplasty at L2. EXAM: MRI THORACIC AND LUMBAR SPINE WITHOUT AND WITH CONTRAST TECHNIQUE: Multiplanar and multiecho pulse sequences of the thoracic and lumbar spine were obtained without and with intravenous contrast. CONTRAST:  7mL GADAVIST GADOBUTROL 1 MMOL/ML IV SOLN COMPARISON:  MRI thoracic and lumbar spine 11/25/2022. FINDINGS: MRI THORACIC SPINE FINDINGS Alignment:  Normal. Vertebrae: Vertebral body heights are preserved. Background marrow signal is normal. Stir hyperintense, enhancing lesions in the T3 and T9 vertebral bodies measuring approximately 1.0 cm and 0.8 cm, respectively, are not significantly changed since the prior study. No other metastatic lesions are seen in the thoracic spine. There is no evidence of epidural or soft tissue tumor. There is no evidence of pathologic fracture. Cord:  Normal in signal and morphology without abnormal enhancement. Paraspinal and other soft tissues: Unremarkable. Disc levels: There is overall mild degenerative change of the thoracic spine. A small disc protrusion is again noted at T9-T10. There is no large disc herniation. There is no significant spinal canal or neural foraminal stenosis. MRI LUMBAR SPINE FINDINGS Segmentation: Standard; the lowest formed disc space is designated L5-S1. Alignment: Trace retrolisthesis of L5 on S1 and bony retropulsion of the L2 vertebral body are unchanged. Vertebrae: Again seen is severe  compression deformity with vertebral augmentation change at L2. Up to approximately 75% loss of vertebral body height anteriorly is unchanged. There is 5-6 mm bony retropulsion resulting in persistent severe spinal canal stenosis with compression of the cauda equina nerve roots. Metastatic disease is again seen in the posterior elements of L2. Additional metastatic lesions are seen in the L3 vertebral body eccentric to the right extending to the right pedicle and posterior elements. Mild compression deformity of the superior L3 endplate eccentric to the right is unchanged. Metastatic lesions along the superior L4 endplate, left L4 pedicle, L4 spinous process, L5 spinous process, and along the inferior aspect of the L5 vertebral body are all not significantly changed. There is no definite new osseous metastatic disease. There is no epidural or soft tissue tumor. There is no new pathologic fracture. T1 hyperintensity in the marrow of the lumbar spine likely reflects radiation change. Multiple old rib fractures are noted. Conus medullaris: Extends to the L1-L2 level and appears normal. There is no definite abnormal enhancement of the cauda equina nerve roots. Paraspinal  and other soft tissues: Unremarkable. Disc levels: T12-L1: No significant spinal canal or neural foraminal stenosis L1-L2: There is severe spinal canal stenosis due to bony retropulsion of the L2 vertebral body, unchanged. There is no high-grade neural foraminal stenosis L2-L3: There is severe spinal canal stenosis due to the bony retropulsion of the L2 vertebral body, unchanged. There is no high-grade neural foraminal stenosis. L3-L4: There is a mild disc bulge and moderate right and mild left facet arthropathy resulting in mild right and no significant left neural foraminal stenosis and no significant spinal canal stenosis, unchanged. L4-L5: There is a disc bulge eccentric to the left, degenerative endplate spurring, and mild bilateral facet  arthropathy resulting in left subarticular zone narrowing and moderate left and no significant right neural foraminal stenosis, unchanged. L5-S1: There is a disc bulge with superimposed broad-based right subarticular zone protrusion, degenerative endplate spurring, and mild bilateral facet arthropathy resulting in right subarticular zone narrowing with potential irritation of the traversing right S1 nerve root, and no significant neural foraminal stenosis, unchanged. IMPRESSION: 1. Overall unchanged metastatic disease in the thoracic and lumbar spine compared to the MRI from 11/25/2022. No new lesions or epidural soft tissue tumor is identified. 2. Unchanged severe compression deformity status post vertebral augmentation at L2 with 5-6 mm bony retropulsion resulting in persistent severe spinal canal stenosis with compression of the cauda equina nerve roots. 3. No new pathologic fracture. Electronically Signed   By: Lesia Hausen M.D.   On: 03/02/2023 13:54   MR Brain W Wo Contrast  Result Date: 03/02/2023 CLINICAL DATA:  Brain metastases, assess treatment response 3T SRS Protocol EXAM: MRI HEAD WITHOUT AND WITH CONTRAST TECHNIQUE: Multiplanar, multiecho pulse sequences of the brain and surrounding structures were obtained without and with intravenous contrast. CONTRAST:  7mL GADAVIST GADOBUTROL 1 MMOL/ML IV SOLN COMPARISON:  MRI head 11/25/2022. FINDINGS: Brain: Punctate enhancing right cerebellar lesion, similar. No new enhancing lesions identified. Multiple small punctate area of susceptibility artifact, compatible with prior treated metastases and/or chronic microhemorrhages. No evidence of acute infarct, acute hemorrhage, midline shift or hydrocephalus. Vascular: Major arterial flow voids are maintained. Skull and upper cervical spine: Normal marrow signal. Left posterior upper neck lipoma. Sinuses/Orbits: Mild paranasal sinus mucosal thickening. No acute orbital finding. IMPRESSION: Similar punctate  enhancing right cerebellar lesion. No new enhancing metastases identified. Electronically Signed   By: Feliberto Harts M.D.   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 He has molecular studies by foundation 1 that showed no actionable mutations and PD-L1 expression was 90%. He underwent palliative radiotherapy to the L2 vertebral body compression fraction as well as solitary brain metastasis with SRS. He is currently undergoing palliative systemic chemotherapy with carboplatin for AUC of 5, Alimta 500 Mg/M2 and Keytruda 200 Mg IV every 3 weeks.  Status post 9 cycles.  Starting from cycle #5 he is on treatment with maintenance Alimta and Keytruda every 3 weeks. The patient has been tolerating this treatment well with no concerning adverse effects. He had repeat CT scan of the chest, abdomen and pelvis performed recently.  The final report of the  scan is still pending but I personally and independently reviewed the scan and I did not see any concerning findings for progression but I would wait for the final report for confirmation. I recommended for the patient to continue his current treatment with maintenance Alimta and Keytruda and he will proceed with cycle #10 today. I will see him back for follow-up visit in 3 weeks for evaluation before starting cycle #11. For the pain management, he is currently on MS Contin 15 mg p.o. twice daily in addition to Oxy IR for breakthrough pain and managed by the palliative care team.  The patient was advised to call immediately  if he has any other concerning symptoms in the interval. The patient voices understanding of current disease status and treatment options and is in agreement with the current care plan.  All questions were answered. The patient knows to call the clinic with any problems, questions or concerns. We can certainly see the patient much sooner if necessary.  The total time spent in the appointment was 30 minutes.  Disclaimer: This note was dictated with voice recognition software. Similar sounding words can inadvertently be transcribed and may not be corrected upon review.

## 2023-03-16 ENCOUNTER — Telehealth: Payer: Self-pay | Admitting: Internal Medicine

## 2023-03-16 NOTE — Telephone Encounter (Signed)
Called patient regarding upcoming May/June appointments, left a voicemail.

## 2023-03-21 ENCOUNTER — Ambulatory Visit: Payer: Medicaid Other | Attending: Critical Care Medicine | Admitting: Pharmacist

## 2023-03-21 ENCOUNTER — Encounter: Payer: Self-pay | Admitting: Pharmacist

## 2023-03-21 ENCOUNTER — Encounter: Payer: Self-pay | Admitting: Critical Care Medicine

## 2023-03-21 ENCOUNTER — Other Ambulatory Visit (HOSPITAL_COMMUNITY): Payer: Self-pay

## 2023-03-21 VITALS — Wt 154.4 lb

## 2023-03-21 DIAGNOSIS — E1169 Type 2 diabetes mellitus with other specified complication: Secondary | ICD-10-CM

## 2023-03-21 DIAGNOSIS — Z794 Long term (current) use of insulin: Secondary | ICD-10-CM

## 2023-03-21 LAB — POCT GLYCOSYLATED HEMOGLOBIN (HGB A1C): HbA1c, POC (controlled diabetic range): 8.6 % — AB (ref 0.0–7.0)

## 2023-03-21 MED ORDER — ATORVASTATIN CALCIUM 40 MG PO TABS
40.0000 mg | ORAL_TABLET | Freq: Every day | ORAL | 1 refills | Status: DC
  Filled 2023-03-21 – 2023-06-04 (×3): qty 90, 90d supply, fill #0
  Filled 2023-07-11: qty 90, 90d supply, fill #1

## 2023-03-21 MED ORDER — LANTUS SOLOSTAR 100 UNIT/ML ~~LOC~~ SOPN
40.0000 [IU] | PEN_INJECTOR | Freq: Every day | SUBCUTANEOUS | 11 refills | Status: DC
  Filled 2023-03-21: qty 15, 37d supply, fill #0
  Filled 2023-05-15 – 2023-06-04 (×2): qty 15, 37d supply, fill #1

## 2023-03-21 NOTE — Progress Notes (Signed)
    S:     No chief complaint on file.  57 y.o. male who presents for diabetes evaluation, education, and management.  PMH is significant for adenocarcinoma of the lung undergoing treatment, T2DM.  Patient was referred and last seen by Primary Care Provider, Dr. Delford Field, on 01/19/2023. At last visit, reported BG 250-300s. Novolog 70/30 was increased from 10 units BID to 25 units BID and started jardiance 10 mg daily.    Today, patient arrives in good spirits and presents without any assistance. Patient was previously on Victoza and was unable to tolerate. Was also previously on glimepiride, Levemir, and metformin. Patient does report numbing in his legs that is improved with exercise. A1c today 8.6% down from 13% previously.   Patient reports Diabetes longstanding  Current diabetes medications include: Jardiance 10 mg daily, Novolog 70/30 25 units BID Current hyperlipidemia medications include: atorvastatin 40 mg   Patient reports adherence to taking all medications as prescribed.  Patient reports missing evening dose of insulin about 2x/week.  Insurance coverage: Medicaid  Patient denies hypoglycemic events.  Reported home fasting blood sugars: 120-150s   Patient reports nocturia (nighttime urination). When misses insulin dose. Patient reports neuropathy (nerve pain). Patient reports visual changes. Sees eye doctor tomorrow. Patient reports self foot exams.   Patient reported dietary habits: Eats 3-4 meals/day Eats "pretty healthy most of the time" Has cookies and chips Appetite has returned  Patient-reported exercise habits: Signed up for [employer]   O:    Lab Results  Component Value Date   HGBA1C 8.6 (A) 03/21/2023   There were no vitals filed for this visit.  Lipid Panel     Component Value Date/Time   CHOL 116 01/19/2023 0937   TRIG 76 01/19/2023 0937   HDL 40 01/19/2023 0937   CHOLHDL 2.9 01/19/2023 0937   CHOLHDL 2.4 03/11/2021 1843   VLDL 23  03/11/2021 1843   LDLCALC 61 01/19/2023 0937    Clinical Atherosclerotic Cardiovascular Disease (ASCVD): Yes  The ASCVD Risk score (Arnett DK, et al., 2019) failed to calculate for the following reasons:   The patient has a prior MI or stroke diagnosis   A/P: Diabetes longstanding currently above goal with A1c 8.6%. Patient is able to verbalize appropriate hypoglycemia management plan. Medication adherence appears appropriate. Control is suboptimal due to missing evening insulin doses. -Started basal insulin Lantus (insulin glargine) 40 units daily in the morning.  -Stop Novolog 70/30 -Continued SGLT2-I Jardiance (empagliflozin) 10 mg. Counseled on sick day rules. -Patient educated on purpose, proper use, and potential adverse effects of Jardiance.  -Extensively discussed pathophysiology of diabetes, recommended lifestyle interventions, dietary effects on blood sugar control.  -Counseled on s/sx of and management of hypoglycemia.  -Next A1c anticipated today.   Written patient instructions provided. Patient verbalized understanding of treatment plan.  Total time in face to face counseling 30 minutes.    Follow-up:  Pharmacist prn. PCP clinic visit in 1 month.   Georga Hacking, Pharm.D PGY1 Pharmacy Resident

## 2023-03-29 ENCOUNTER — Other Ambulatory Visit (HOSPITAL_COMMUNITY): Payer: Self-pay

## 2023-03-31 NOTE — Progress Notes (Unsigned)
[facility] OFFICE PROGRESS NOTE  [name], MD 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    Biomarker Findings Microsatellite status - MS-Stable Tumor Mutational Burden - 8 Muts/Mb Genomic Findings For a complete list of the genes assayed, please refer to the Appendix. KEAP1 Q227* CDKN2A/B [id number] G89V PRKCI amplification TERC amplification - equivocal? TP53 [id number] 8 Disease relevant genes with no reportable alterations: ALK, BRAF, EGFR, ERBB2, KRAS, MET, RET, ROS1   PDL1 TPS  90%  PRIOR THERAPY: 1) Status post palliative radiotherapy to the L2 lesion under the care of Dr. Kathrynn Running. 2) Status post SRS to solitary brain metastasis under the care of Dr. Kathrynn Running  CURRENT THERAPY: Palliative systemic therapy with carboplatin for AUC of 5, Alimta 500 Mg/M2 and Keytruda 200 Mg IV every 3 weeks.  First dose September 05, 2022.  Status post 10 cycles. Starting from cycle #5, he started on maintenance keytruda and alimta IV every 3 weeks.      INTERVAL HISTORY: Samuel [name] II 57 y.o. male returns to the clinic today for a follow-up visit. The patient was diagnosed with lung cancer in September 2023.  He is currently undergoing palliative systemic maintenance chemotherapy and immunotherapy. The patient appreciates that he feels a lot better than when he was initially diagnosed. When he was first diagnosed, he could not walk. While he still has trouble with back pain and radiating pain down his legs, it is a lot better than it was previously. He is wondering if he needs to see an  orthopedic provider for his back. He sees his new PCP soon and he wants to ask him his opinion on this. He previously received palliative radiation to L2. His most recent CT scan from last month showed stable degenerative disc disease at the lumbosacral junction with multiple lumbosacral disc bulges and continued severe compression deformity at L2. He had radiofrequency ablation and balloon kyphoplasty at L2 in September 2024.   He also follows with palliative care and his pain is controlled at this time.   Overall, he feels "good". He established with a new PCP recently and he states he is getting back on track with seeing several providers such as an eye doctor and dentist. He also is working with his PCP for diabetes control. He was in a rush this morning and did not take his medications. He is hoping to join [employer] to exercise.   Otherwise he denies any fever, chills, night sweats, or appetite changes.  Denies any chest pain, shortness of breath, or cough.  He reports he sometimes has a cough but it is intermittent and not daily.  It is nonproductive.  He denies any nausea, vomiting, or constipation.  About 2 weeks ago he had 2 days of diarrhea which resolved spontaneously.  Denies any headache or visual changes.  He has a small dry lesion on his right shoulder which is improving.  He is here today for evaluation repeat blood work before undergoing cycle #11.  MEDICAL HISTORY: Past Medical History:  Diagnosis Date   Alcohol use disorder, severe, dependence (HCC) 12/26/2016   Diabetes mellitus without complication (HCC)  Metastatic cancer to brain (HCC) 08/05/2022   Metastatic cancer to spine (HCC) 07/19/2022   Pancreatitis unk   Primary lung adenocarcinoma (HCC) 08/05/2022   Suicidal ideation    Type 2 diabetes mellitus with hyperglycemia, without long-term current use of insulin (HCC) 06/13/2022   Formatting of this note might be different from the original. 06/13/2022 A1C 13.8, FSBG  414 Start empagliflozin 5mg /metformin 1000 daily, levemir 20 qhs. (Samples given) Will apply for medassist for pharmacy    ALLERGIES:  is allergic to penicillins and percocet [oxycodone-acetaminophen].  MEDICATIONS:  Current Outpatient Medications  Medication Sig Dispense Refill   Accu-Chek Softclix Lancets lancets Use to check blood sugar 3 times daily. E11.69 100 each 6   atorvastatin (LIPITOR) 40 MG tablet Take 1 tablet (40 mg total) by mouth daily. 90 tablet 1   blood glucose meter kit and supplies KIT Dispense based on patient and insurance preference. Use up to four times daily as directed. 1 each 0   Blood Glucose Monitoring Suppl (ACCU-CHEK GUIDE) w/Device KIT Use to check blood sugar 3 times daily. E11.69 1 kit 0   empagliflozin (JARDIANCE) 10 MG TABS tablet Take 1 tablet (10 mg total) by mouth daily before breakfast. 60 tablet 4   folic acid (FOLVITE) 1 MG tablet Take 1 tablet (1 mg total) by mouth daily. 30 tablet 4   gabapentin (NEURONTIN) 300 MG capsule Take 2 capsules (600 mg total) by mouth 3 (three) times daily. 180 capsule 1   glucose blood (ACCU-CHEK GUIDE) test strip Use to check blood sugar 3 times daily. E11.69 100 each 6   insulin glargine (LANTUS SOLOSTAR) 100 UNIT/ML Solostar Pen Inject 40 Units into the skin daily. 15 mL 11   Insulin Pen Needle 32G X 4 MM MISC Use with Insulin pen 100 each 3   ipratropium-albuterol (DUONEB) 0.5-2.5 (3) MG/3ML SOLN Take 3 mLs by nebulization every 6 (six) hours as needed (Shortness of breath). 360 mL 0   lidocaine-prilocaine (EMLA) cream Apply to the Port-A-Cath site 30-60 minutes before chemo. 30 g 0   Oxycodone HCl 10 MG TABS Take 1 tablet (10 mg) by mouth every 6 hours as needed for severe pain. 60 tablet 0   prochlorperazine (COMPAZINE) 10 MG tablet Take 1 tablet (10 mg total) by mouth every 6 (six) hours as needed for nausea or vomiting. 30 tablet 0   tiZANidine (ZANAFLEX) 2 MG tablet Take 1 tablet (2 mg) by mouth at bedtime. 30  tablet 1   No current facility-administered medications for this visit.    SURGICAL HISTORY:  Past Surgical History:  Procedure Laterality Date   IR BONE TUMOR(S)RF ABLATION  08/01/2022   IR IMAGING GUIDED PORT INSERTION  09/21/2022   IR KYPHO LUMBAR INC FX REDUCE BONE BX UNI/BIL CANNULATION INC/IMAGING  08/01/2022    REVIEW OF SYSTEMS:   Constitutional: Negative for appetite change, chills, fatigue, fever and unexpected weight change.  HENT: Negative for mouth sores, nosebleeds, sore throat and trouble swallowing.  Eyes: Negative for eye problems and icterus.  Respiratory: Positive for mild cough. Negative for hemoptysis, shortness of breath and wheezing.   Cardiovascular: Negative for chest pain and leg swelling.  Gastrointestinal: Negative for abdominal pain, constipation, diarrhea, nausea and vomiting.  Genitourinary: Negative for bladder incontinence, difficulty urinating, dysuria, frequency and hematuria.   Musculoskeletal: Positive for improved but persistent back pain with radiation down his L leg occasionally.  Negative for gait problem, neck pain and neck stiffness.  Skin: Positive for single circular scaly  skin lesion on right shoulder. Negative for itching and rash.  Neurological: Negative for dizziness, extremity weakness, gait problem, headaches, light-headedness and seizures.  Hematological: Negative for adenopathy. Does not bruise/bleed easily.  Psychiatric/Behavioral: Negative for confusion, depression and sleep disturbance. The patient is not nervous/anxious.    PHYSICAL EXAMINATION:  Blood pressure 123/72, pulse 83, temperature 98.3 F (36.8 C), temperature source Oral, resp. rate 17, height 5\' 8"  (1.727 m), weight 147 lb 8 oz (66.9 kg), SpO2 100 %.  ECOG PERFORMANCE STATUS: 1  Physical Exam  Constitutional: Oriented to person, place, and time and thin appearing male, and in no distress.  HENT:  Head: Normocephalic and atraumatic.  Mouth/Throat: Oropharynx is  clear and moist. No oropharyngeal exudate.  Eyes: Conjunctivae are normal. Right eye exhibits no discharge. Left eye exhibits no discharge. No scleral icterus.  Neck: Normal range of motion. Neck supple.  Cardiovascular: Normal rate, regular rhythm, normal heart sounds and intact distal pulses.   Pulmonary/Chest: Effort normal. Quiet breath sounds bilaterally. No respiratory distress. No wheezes. No rales.  Abdominal: Soft. Bowel sounds are normal. Exhibits no distension and no mass. There is no tenderness.  Musculoskeletal: Back brace. Normal range of motion. Exhibits no edema.  Lymphadenopathy:    No cervical adenopathy.  Neurological: Alert and oriented to person, place, and time. Exhibits muscle wasting. Gait normal. Coordination normal.  Skin: Small dry skin lesion on right shoulder. Skin is warm and dry. Not diaphoretic. No erythema. No pallor.  Psychiatric: Mood, memory and judgment normal.  Vitals reviewed.  LABORATORY DATA: Lab Results  Component Value Date   WBC 8.0 04/05/2023   HGB 12.9 (L) 04/05/2023   HCT 38.2 (L) 04/05/2023   MCV 99.5 04/05/2023   PLT 178 04/05/2023      Chemistry      Component Value Date/Time   NA 138 04/05/2023 0745   NA 140 12/24/2020 1144   NA 142 08/02/2014 1649   K 3.8 04/05/2023 0745   K 4.2 08/02/2014 1649   CL 106 04/05/2023 0745   CL 109 (H) 08/02/2014 1649   CO2 28 04/05/2023 0745   CO2 21 08/02/2014 1649   BUN 15 04/05/2023 0745   BUN 7 12/24/2020 1144   BUN 11 08/02/2014 1649   CREATININE 0.66 04/05/2023 0745   CREATININE 0.92 08/02/2014 1649      Component Value Date/Time   CALCIUM 8.7 (L) 04/05/2023 0745   CALCIUM 8.6 08/02/2014 1649   ALKPHOS 76 04/05/2023 0745   ALKPHOS 82 08/02/2014 1649   AST 29 04/05/2023 0745   ALT 31 04/05/2023 0745   ALT 132 (H) 08/02/2014 1649   BILITOT 0.3 04/05/2023 0745       RADIOGRAPHIC STUDIES:  CT Chest W Contrast  Result Date: 03/13/2023 CLINICAL DATA:  Non-small-cell lung  cancer. Restaging. * Tracking Code: BO * EXAM: CT CHEST, ABDOMEN, AND PELVIS WITH CONTRAST TECHNIQUE: Multidetector CT imaging of the chest, abdomen and pelvis was performed following the standard protocol during bolus administration of intravenous contrast. RADIATION DOSE REDUCTION: This exam was performed according to the departmental dose-optimization program which includes automated exposure control, adjustment of the mA and/or kV according to patient size and/or use of iterative reconstruction technique. CONTRAST:  OMNIPAQUE IOHEXOL 300 MG/ML  SOLN COMPARISON:  01/06/2023 FINDINGS: CT CHEST FINDINGS Cardiovascular: Right Port-A-Cath tip mid right atrium. Aortic atherosclerosis. Normal heart size, without pericardial effusion. No central pulmonary embolism, on this non-dedicated study. Mediastinum/Nodes: No supraclavicular adenopathy. No axillary adenopathy. No mediastinal or hilar  adenopathy. Esophageal fluid level on 68/2. Lungs/Pleura: No pleural fluid.  Moderate centrilobular emphysema. Musculoskeletal: Remote bilateral rib fractures. T3 mixed lytic and sclerotic lesion is unchanged. CT ABDOMEN PELVIS FINDINGS Hepatobiliary: Tiny low-density right hepatic lobe lesion on 23/5 is unchanged. No enlarging liver lesion or biliary abnormality. Pancreas: Normal, without mass or ductal dilatation. Spleen: Normal in size, without focal abnormality. Adrenals/Urinary Tract: Normal adrenal glands. Punctate right renal collecting system calculi x2. Normal left kidney. No hydronephrosis. Stomach/Bowel: Proximal gastric underdistention. Colonic stool burden suggests constipation. Normal terminal ileum. Appendix not visualized. Normal small bowel. Vascular/Lymphatic: Aortic atherosclerosis. No abdominopelvic adenopathy. Reproductive: Normal prostate. Other: No significant free fluid. No free intraperitoneal air. No evidence of omental or peritoneal disease. Musculoskeletal: Severe compression deformity at L2 with  ventral canal encroachment and vertebral augmentation, similar. Underlying sclerosis is not significantly changed. Mild superior endplate irregularity at L4 is unchanged. L3 eccentric right lytic lesion including a 1.9 cm and 51/11, similar. Degenerative disc disease at the lumbosacral junction with multiple lumbosacral disc bulges. IMPRESSION: 1. Similar thoracolumbar vertebral body osseous metastasis. 2. No evidence of extraosseous metastatic disease. 3. Aortic atherosclerosis (ICD10-I70.0) and emphysema (ICD10-J43.9). 4. Esophageal air fluid level suggests dysmotility or gastroesophageal reflux. 5. Right nephrolithiasis 6.  Possible constipation. Electronically Signed   By: Jeronimo Greaves M.D.   On: 03/13/2023 11:58   CT Abdomen Pelvis W Contrast  Result Date: 03/13/2023 CLINICAL DATA:  Non-small-cell lung cancer. Restaging. * Tracking Code: BO * EXAM: CT CHEST, ABDOMEN, AND PELVIS WITH CONTRAST TECHNIQUE: Multidetector CT imaging of the chest, abdomen and pelvis was performed following the standard protocol during bolus administration of intravenous contrast. RADIATION DOSE REDUCTION: This exam was performed according to the departmental dose-optimization program which includes automated exposure control, adjustment of the mA and/or kV according to patient size and/or use of iterative reconstruction technique. CONTRAST:  OMNIPAQUE IOHEXOL 300 MG/ML  SOLN COMPARISON:  01/06/2023 FINDINGS: CT CHEST FINDINGS Cardiovascular: Right Port-A-Cath tip mid right atrium. Aortic atherosclerosis. Normal heart size, without pericardial effusion. No central pulmonary embolism, on this non-dedicated study. Mediastinum/Nodes: No supraclavicular adenopathy. No axillary adenopathy. No mediastinal or hilar adenopathy. Esophageal fluid level on 68/2. Lungs/Pleura: No pleural fluid.  Moderate centrilobular emphysema. Musculoskeletal: Remote bilateral rib fractures. T3 mixed lytic and sclerotic lesion is unchanged. CT ABDOMEN  PELVIS FINDINGS Hepatobiliary: Tiny low-density right hepatic lobe lesion on 23/5 is unchanged. No enlarging liver lesion or biliary abnormality. Pancreas: Normal, without mass or ductal dilatation. Spleen: Normal in size, without focal abnormality. Adrenals/Urinary Tract: Normal adrenal glands. Punctate right renal collecting system calculi x2. Normal left kidney. No hydronephrosis. Stomach/Bowel: Proximal gastric underdistention. Colonic stool burden suggests constipation. Normal terminal ileum. Appendix not visualized. Normal small bowel. Vascular/Lymphatic: Aortic atherosclerosis. No abdominopelvic adenopathy. Reproductive: Normal prostate. Other: No significant free fluid. No free intraperitoneal air. No evidence of omental or peritoneal disease. Musculoskeletal: Severe compression deformity at L2 with ventral canal encroachment and vertebral augmentation, similar. Underlying sclerosis is not significantly changed. Mild superior endplate irregularity at L4 is unchanged. L3 eccentric right lytic lesion including a 1.9 cm and 51/11, similar. Degenerative disc disease at the lumbosacral junction with multiple lumbosacral disc bulges. IMPRESSION: 1. Similar thoracolumbar vertebral body osseous metastasis. 2. No evidence of extraosseous metastatic disease. 3. Aortic atherosclerosis (ICD10-I70.0) and emphysema (ICD10-J43.9). 4. Esophageal air fluid level suggests dysmotility or gastroesophageal reflux. 5. Right nephrolithiasis 6.  Possible constipation. Electronically Signed   By: Jeronimo Greaves M.D.   On: 03/13/2023 11:58     ASSESSMENT/PLAN:  This is a very pleasant 57 year old African-American male diagnosed with stage IV (T3, N2, M1 C) non-small cell lung cancer, adenocarcinoma.  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  He underwent biopsy  and kyphoplasty to the L2 and to the brain metastasis.  He was diagnosed in September 2023.   His molecular studies showed no actionable mutations and his PD-L1 expression is 90%.  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    He underwent palliative radiation to L2 compression fracture as well as a solitary brain metastasis with SRS.    He is currently being treated with palliative systemic chemotherapy with carboplatin for an AUC of 5, Alimta 500 mg per metered square, Keytruda 200 mg IV every 3 weeks.  He status post 10 cycles and he tolerated well without any concerning adverse side effects.  Starting from cycle #5 he started maintenance Alimta and Keytruda.  IV every 3 weeks   Radiation oncology is monitoring his brain MRI and lumbar spine closely every few months..   At his last appointment, his restaging CT scan was not read. The patient was given a copy of his scan from last month today which was stable.     Labs were reviewed.  Recommend he proceed with cycle #11 today as scheduled.  We will see him back for follow-up visit in 3 weeks for evaluation repeat blood work before undergoing cycle #12.   He will continue to follow with palliative care. The patient is appreciative that his quality of life has improved since starting treatment.   For the skin lesion, advised to use hydrocortisone cream.   He will continue to take his insulin as prescribed.   He sometimes has a mild cough. He states this is not daily and is non productive. He is going to try to use delsym OTC.   The patient was advised to call immediately if he has any concerning symptoms in the interval. The patient voices understanding of current disease status and treatment options and is in agreement with the current care plan. All questions were answered. The patient knows to call the clinic with any  problems, questions or concerns. We can certainly see the patient much sooner if necessary         No orders of the defined types were placed in this encounter.    The total time spent in the appointment was 20-29 minutes.   Kyland No L Lillyanne Bradburn, PA-C 04/05/23

## 2023-04-03 ENCOUNTER — Inpatient Hospital Stay: Payer: Medicaid Other

## 2023-04-03 ENCOUNTER — Inpatient Hospital Stay: Payer: Medicaid Other | Admitting: Internal Medicine

## 2023-04-04 ENCOUNTER — Other Ambulatory Visit: Payer: Self-pay | Admitting: Radiation Therapy

## 2023-04-04 DIAGNOSIS — C7931 Secondary malignant neoplasm of brain: Secondary | ICD-10-CM

## 2023-04-05 ENCOUNTER — Encounter: Payer: Self-pay | Admitting: Internal Medicine

## 2023-04-05 ENCOUNTER — Inpatient Hospital Stay: Payer: Medicaid Other

## 2023-04-05 ENCOUNTER — Inpatient Hospital Stay: Payer: Medicaid Other | Attending: Adult Health | Admitting: Nurse Practitioner

## 2023-04-05 ENCOUNTER — Encounter: Payer: Self-pay | Admitting: Nurse Practitioner

## 2023-04-05 ENCOUNTER — Inpatient Hospital Stay (HOSPITAL_BASED_OUTPATIENT_CLINIC_OR_DEPARTMENT_OTHER): Payer: Medicaid Other | Admitting: Physician Assistant

## 2023-04-05 ENCOUNTER — Other Ambulatory Visit: Payer: Self-pay

## 2023-04-05 ENCOUNTER — Other Ambulatory Visit (HOSPITAL_COMMUNITY): Payer: Self-pay

## 2023-04-05 VITALS — BP 123/72 | HR 83 | Temp 98.3°F | Resp 17 | Ht 68.0 in | Wt 147.5 lb

## 2023-04-05 VITALS — BP 110/78 | HR 78 | Resp 16

## 2023-04-05 DIAGNOSIS — L989 Disorder of the skin and subcutaneous tissue, unspecified: Secondary | ICD-10-CM | POA: Diagnosis not present

## 2023-04-05 DIAGNOSIS — C7931 Secondary malignant neoplasm of brain: Secondary | ICD-10-CM | POA: Diagnosis not present

## 2023-04-05 DIAGNOSIS — C7951 Secondary malignant neoplasm of bone: Secondary | ICD-10-CM | POA: Insufficient documentation

## 2023-04-05 DIAGNOSIS — G893 Neoplasm related pain (acute) (chronic): Secondary | ICD-10-CM | POA: Diagnosis not present

## 2023-04-05 DIAGNOSIS — C3490 Malignant neoplasm of unspecified part of unspecified bronchus or lung: Secondary | ICD-10-CM | POA: Diagnosis not present

## 2023-04-05 DIAGNOSIS — Z5111 Encounter for antineoplastic chemotherapy: Secondary | ICD-10-CM | POA: Diagnosis not present

## 2023-04-05 DIAGNOSIS — Z79899 Other long term (current) drug therapy: Secondary | ICD-10-CM | POA: Insufficient documentation

## 2023-04-05 DIAGNOSIS — M792 Neuralgia and neuritis, unspecified: Secondary | ICD-10-CM

## 2023-04-05 DIAGNOSIS — Z515 Encounter for palliative care: Secondary | ICD-10-CM

## 2023-04-05 DIAGNOSIS — E119 Type 2 diabetes mellitus without complications: Secondary | ICD-10-CM | POA: Diagnosis not present

## 2023-04-05 DIAGNOSIS — F17210 Nicotine dependence, cigarettes, uncomplicated: Secondary | ICD-10-CM | POA: Insufficient documentation

## 2023-04-05 DIAGNOSIS — Z7984 Long term (current) use of oral hypoglycemic drugs: Secondary | ICD-10-CM | POA: Insufficient documentation

## 2023-04-05 DIAGNOSIS — Z5112 Encounter for antineoplastic immunotherapy: Secondary | ICD-10-CM | POA: Insufficient documentation

## 2023-04-05 LAB — CMP (CANCER CENTER ONLY)
ALT: 31 U/L (ref 0–44)
AST: 29 U/L (ref 15–41)
Albumin: 3.8 g/dL (ref 3.5–5.0)
Alkaline Phosphatase: 76 U/L (ref 38–126)
Anion gap: 4 — ABNORMAL LOW (ref 5–15)
BUN: 15 mg/dL (ref 6–20)
CO2: 28 mmol/L (ref 22–32)
Calcium: 8.7 mg/dL — ABNORMAL LOW (ref 8.9–10.3)
Chloride: 106 mmol/L (ref 98–111)
Creatinine: 0.66 mg/dL (ref 0.61–1.24)
GFR, Estimated: >60 mL/min (ref >60)
Glucose, Bld: 219 mg/dL — ABNORMAL HIGH (ref 70–99)
Potassium: 3.8 mmol/L (ref 3.5–5.1)
Sodium: 138 mmol/L (ref 135–145)
Total Bilirubin: 0.3 mg/dL (ref 0.3–1.2)
Total Protein: 6.9 g/dL (ref 6.5–8.1)

## 2023-04-05 LAB — CBC WITH DIFFERENTIAL (CANCER CENTER ONLY)
Abs Immature Granulocytes: 0.02 10*3/uL (ref 0.00–0.07)
Basophils Absolute: 0 10*3/uL (ref 0.0–0.1)
Basophils Relative: 0 %
Eosinophils Absolute: 0.1 10*3/uL (ref 0.0–0.5)
Eosinophils Relative: 1 %
HCT: 38.2 % — ABNORMAL LOW (ref 39.0–52.0)
Hemoglobin: 12.9 g/dL — ABNORMAL LOW (ref 13.0–17.0)
Immature Granulocytes: 0 %
Lymphocytes Relative: 12 %
Lymphs Abs: 1 10*3/uL (ref 0.7–4.0)
MCH: 33.6 pg (ref 26.0–34.0)
MCHC: 33.8 g/dL (ref 30.0–36.0)
MCV: 99.5 fL (ref 80.0–100.0)
Monocytes Absolute: 0.6 10*3/uL (ref 0.1–1.0)
Monocytes Relative: 8 %
Neutro Abs: 6.3 10*3/uL (ref 1.7–7.7)
Neutrophils Relative %: 79 %
Platelet Count: 178 10*3/uL (ref 150–400)
RBC: 3.84 MIL/uL — ABNORMAL LOW (ref 4.22–5.81)
RDW: 12.8 % (ref 11.5–15.5)
WBC Count: 8 10*3/uL (ref 4.0–10.5)
nRBC: 0 % (ref 0.0–0.2)

## 2023-04-05 MED ORDER — TIZANIDINE HCL 2 MG PO TABS
2.0000 mg | ORAL_TABLET | Freq: Every day | ORAL | 1 refills | Status: DC
Start: 2023-04-05 — End: 2023-07-11
  Filled 2023-04-05: qty 30, 30d supply, fill #0
  Filled 2023-05-15 – 2023-06-04 (×2): qty 30, 30d supply, fill #1

## 2023-04-05 MED ORDER — HEPARIN SOD (PORK) LOCK FLUSH 100 UNIT/ML IV SOLN
500.0000 [IU] | Freq: Once | INTRAVENOUS | Status: AC | PRN
  Administered 2023-04-05: 500 [IU]

## 2023-04-05 MED ORDER — SODIUM CHLORIDE 0.9 % IV SOLN
500.0000 mg/m2 | Freq: Once | INTRAVENOUS | Status: AC
  Administered 2023-04-05: 900 mg via INTRAVENOUS
  Filled 2023-04-05: qty 20

## 2023-04-05 MED ORDER — SODIUM CHLORIDE 0.9 % IV SOLN: Freq: Once | INTRAVENOUS | Status: AC

## 2023-04-05 MED ORDER — OXYCODONE HCL 10 MG PO TABS
10.0000 mg | ORAL_TABLET | Freq: Four times a day (QID) | ORAL | 0 refills | Status: DC | PRN
Start: 2023-04-05 — End: 2023-04-25
  Filled 2023-04-05: qty 60, 15d supply, fill #0

## 2023-04-05 MED ORDER — SODIUM CHLORIDE 0.9% FLUSH
10.0000 mL | INTRAVENOUS | Status: DC | PRN
  Administered 2023-04-05: 10 mL

## 2023-04-05 MED ORDER — FOLIC ACID 1 MG PO TABS
1.0000 mg | ORAL_TABLET | Freq: Every day | ORAL | 4 refills | Status: DC
Start: 2023-04-05 — End: 2024-03-30
  Filled 2023-04-05: qty 30, 30d supply, fill #0
  Filled 2023-05-15 – 2023-06-04 (×2): qty 30, 30d supply, fill #1
  Filled 2023-07-11: qty 30, 30d supply, fill #2
  Filled 2023-10-12: qty 30, 30d supply, fill #0
  Filled 2023-11-24: qty 30, 30d supply, fill #1
  Filled 2023-11-27: qty 30, 30d supply, fill #0

## 2023-04-05 MED ORDER — SODIUM CHLORIDE 0.9% FLUSH
10.0000 mL | Freq: Once | INTRAVENOUS | Status: AC
  Administered 2023-04-05: 10 mL

## 2023-04-05 MED ORDER — GABAPENTIN 300 MG PO CAPS
600.0000 mg | ORAL_CAPSULE | Freq: Three times a day (TID) | ORAL | 1 refills | Status: DC
Start: 2023-04-05 — End: 2023-07-11
  Filled 2023-04-05: qty 180, 30d supply, fill #0
  Filled 2023-05-15 – 2023-06-04 (×2): qty 180, 30d supply, fill #1

## 2023-04-05 MED ORDER — SODIUM CHLORIDE 0.9 % IV SOLN
200.0000 mg | Freq: Once | INTRAVENOUS | Status: AC
  Administered 2023-04-05: 200 mg via INTRAVENOUS
  Filled 2023-04-05: qty 200

## 2023-04-05 MED ORDER — PROCHLORPERAZINE MALEATE 10 MG PO TABS
10.0000 mg | ORAL_TABLET | Freq: Once | ORAL | Status: AC
  Administered 2023-04-05: 10 mg via ORAL
  Filled 2023-04-05: qty 1

## 2023-04-05 NOTE — Progress Notes (Signed)
Palliative Medicine [facility]  [phone] [phone]   Name: Samuel [name] Date: 04/05/2023 MRN: [id number]  DOB: [birth year]  Patient Care Team: [name], MD as PCP - General (Pulmonary Disease) Rema Fendt, NP as Nurse Practitioner (Nurse Practitioner) Valerie Roys, Arty Baumgartner, NP as Nurse Practitioner (Nurse Practitioner)    INTERVAL HISTORY: Samuel [name] is a 57 y.o. 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  Palliative ask to see for symptom management and goals of care.   SOCIAL HISTORY:     reports that he has been smoking cigarettes. He has been smoking an average of 1 pack per day. He has never used smokeless tobacco. He reports current alcohol use of about 50.0 standard drinks of alcohol per week. He reports that he does not use drugs.  ADVANCE DIRECTIVES:  Patient does not have an advanced directives. Education and packet provided. He has expressed interest in completing. States his sister, Samuel [name] would be his designated Clinical research associate.   CODE STATUS: Full code  PAST MEDICAL HISTORY: Past Medical History:  Diagnosis Date   Alcohol use disorder, severe, dependence (HCC) 12/26/2016   Diabetes mellitus without complication (HCC)    Metastatic cancer to brain (HCC) 08/05/2022   Metastatic cancer to spine (HCC) 07/19/2022   Pancreatitis unk   Primary lung adenocarcinoma (HCC) 08/05/2022   Suicidal ideation    Type 2 diabetes mellitus with hyperglycemia, without long-term current use of insulin (HCC) 06/13/2022   Formatting of this note might be different from the original. 06/13/2022 A1C 13.8, FSBG 414 Start empagliflozin 5mg /metformin 1000 daily, levemir 20 qhs. (Samples given) Will apply for medassist for pharmacy    ALLERGIES:  is allergic to penicillins and  percocet [oxycodone-acetaminophen].  MEDICATIONS:  Current Outpatient Medications  Medication Sig Dispense Refill   Accu-Chek Softclix Lancets lancets Use to check blood sugar 3 times daily. E11.69 100 each 6   atorvastatin (LIPITOR) 40 MG tablet Take 1 tablet (40 mg total) by mouth daily. 90 tablet 1   blood glucose meter kit and supplies KIT Dispense based on patient and insurance preference. Use up to four times daily as directed. 1 each 0   Blood Glucose Monitoring Suppl (ACCU-CHEK GUIDE) w/Device KIT Use to check blood sugar 3 times daily. E11.69 1 kit 0   empagliflozin (JARDIANCE) 10 MG TABS tablet Take 1 tablet (10 mg total) by mouth daily before breakfast. 60 tablet 4   folic acid (FOLVITE) 1 MG tablet Take 1 tablet (1 mg total) by mouth daily. 30 tablet 4   gabapentin (NEURONTIN) 300 MG capsule Take 2 capsules (600 mg total) by mouth 3 (three) times daily. 180 capsule 1   glucose blood (ACCU-CHEK GUIDE) test strip Use to check blood sugar 3 times daily. E11.69 100 each 6   insulin glargine (LANTUS SOLOSTAR) 100 UNIT/ML Solostar Pen Inject 40 Units into the skin daily. 15 mL 11   Insulin Pen Needle 32G X 4 MM MISC Use with Insulin pen 100 each 3   ipratropium-albuterol (DUONEB) 0.5-2.5 (3) MG/3ML SOLN Take 3 mLs by nebulization every 6 (six) hours as needed (Shortness of breath). 360 mL 0   lidocaine-prilocaine (EMLA) cream Apply to the Port-A-Cath site 30-60 minutes before chemo. 30 g 0   Oxycodone HCl 10 MG TABS Take 1 tablet (10 mg) by mouth every 6 hours  as needed for severe pain. 60 tablet 0   prochlorperazine (COMPAZINE) 10 MG tablet Take 1 tablet (10 mg total) by mouth every 6 (six) hours as needed for nausea or vomiting. (Patient not taking: Reported on 01/19/2023) 30 tablet 0   tiZANidine (ZANAFLEX) 2 MG tablet Take 1 tablet (2 mg) by mouth at bedtime. 30 tablet 1   No current facility-administered medications for this visit.    VITAL SIGNS: There were no vitals taken for  this visit. There were no vitals filed for this visit.  Estimated body mass index is 23.48 kg/m as calculated from the following:   Height as of 11/07/22: 5\' 8"  (1.727 m).   Weight as of 03/21/23: 154 lb 6.4 oz (70 kg).   PERFORMANCE STATUS (ECOG) : 1 - Symptomatic but completely ambulatory   Physical Exam General: NAD Cardiovascular: regular rate and rhythm Pulmonary:normal breathing pattern  Extremities: no edema, no joint deformities, brace in place  Skin: no rashes Neurological: AAO x4  IMPRESSION: I saw Samuel [name] during infusion. No acute distress. Doing well overall. Denies nausea, vomiting, constipation, or diarrhea. Trying to remain active.   Neoplasm related pain Samuel [name] reports pain is well controlled on current regimen.  We have been working over the past month to wean down off of his MS Contin which has been successful.  He is now at the point this has been discontinued appropriately.  Continues to take oxycodone 10 mg 1-2 times daily. Some days are better than others. Reports bulk of his pain is at bedtime and first thing in the morning. Describes pain as sharp and burning. Is taking gabapentin as prescribed. He has not been taking three times regularly. He is taking responsibly with appropriate refill request.   Will continue to closely monitor and adjust as needed.  Pain contract on file.    2.  Appetite Continues to improve.  Weight is stable.  Expresses appreciation of being able to eat foods that he wants enjoyed.  PLAN: MS Contin discontinued.  Patient has successfully weaned appropriately. Oxy IR 10 mg every 6 hours as needed for breakthrough pain.  Is only taken 1-2 times daily. Colace twice daily Gabapentin 600 mg three times daily  Narcan for emergency use Appetite much improved.Weight is stable. Pain contract signed on 10/23. I will plan to see patient back in 3-4 weeks in collaboration with his other oncology appointments.   Patient expressed  understanding and was in agreement with this plan. He also understands that He can call the clinic at any time with any questions, concerns, or complaints.       Any controlled substances utilized were prescribed in the context of palliative care. PDMP has been reviewed.    Visit consisted of counseling and education dealing with the complex and emotionally intense issues of symptom management and palliative care in the setting of serious and potentially life-threatening illness.Greater than 50%  of this time was spent counseling and coordinating care related to the above assessment and plan.  Willette Alma, AGPCNP-BC  Palliative Medicine Team/[facility]  *Please note that this is a verbal dictation therefore any spelling or grammatical errors are due to the "Dragon [employer] One" system interpretation.

## 2023-04-05 NOTE — Patient Instructions (Signed)
[facility]  Discharge Instructions: Thank you for choosing [facility] to provide your oncology and hematology care.   If you have a lab appointment with the Cancer Center, please go directly to the Cancer Center and check in at the registration area.   Wear comfortable clothing and clothing appropriate for easy access to any Portacath or PICC line.   We strive to give you quality time with your provider. You may need to reschedule your appointment if you arrive late (15 or more minutes).  Arriving late affects you and other patients whose appointments are after yours.  Also, if you miss three or more appointments without notifying the office, you may be dismissed from the clinic at the provider's discretion.      For prescription refill requests, have your pharmacy contact our office and allow 72 hours for refills to be completed.    Today you received the following chemotherapy and/or immunotherapy agents: Keytruda, Alimta.       To help prevent nausea and vomiting after your treatment, we encourage you to take your nausea medication as directed.  BELOW ARE SYMPTOMS THAT SHOULD BE REPORTED IMMEDIATELY: *FEVER GREATER THAN 100.4 F (38 C) OR HIGHER *CHILLS OR SWEATING *NAUSEA AND VOMITING THAT IS NOT CONTROLLED WITH YOUR NAUSEA MEDICATION *UNUSUAL SHORTNESS OF BREATH *UNUSUAL BRUISING OR BLEEDING *URINARY PROBLEMS (pain or burning when urinating, or frequent urination) *BOWEL PROBLEMS (unusual diarrhea, constipation, pain near the anus) TENDERNESS IN MOUTH AND THROAT WITH OR WITHOUT PRESENCE OF ULCERS (sore throat, sores in mouth, or a toothache) UNUSUAL RASH, SWELLING OR PAIN  UNUSUAL VAGINAL DISCHARGE OR ITCHING   Items with * indicate a potential emergency and should be followed up as soon as possible or go to the Emergency Department if any problems should occur.  Please show the CHEMOTHERAPY ALERT CARD or IMMUNOTHERAPY ALERT CARD  at check-in to the Emergency Department and triage nurse.  Should you have questions after your visit or need to cancel or reschedule your appointment, please contact [facility]  Dept: [phone]  and follow the prompts.  Office hours are 8:00 a.m. to 4:30 p.m. Monday - Friday. Please note that voicemails left after 4:00 p.m. may not be returned until the following business day.  We are closed weekends and major holidays. You have access to a nurse at all times for urgent questions. Please call the main number to the clinic Dept: [phone] and follow the prompts.   For any non-urgent questions, you may also contact your provider using MyChart. We now offer e-Visits for anyone 18 and older to request care online for non-urgent symptoms. For details visit [email or web address].   Also download the MyChart app! Go to the app store, search "MyChart", open the app, select [place], and log in with your MyChart username and password.   

## 2023-04-07 ENCOUNTER — Other Ambulatory Visit (HOSPITAL_COMMUNITY): Payer: Self-pay

## 2023-04-10 NOTE — Progress Notes (Unsigned)
New Patient Office Visit  Subjective    Patient ID: Samuel [name], male    DOB: 08/13/1966  Age: 57 y.o. MRN: [id number]  CC:  No chief complaint on file.   HPI 01/19/23 Samuel [name] presents to establish care This is a 57 year old male who has a history of adenocarcinoma of the lung undergoing treatment as documented below by the cancer center.  He is also seen by palliative medicine for pain control.  He had has had mets to the brain and the thoracolumbar spine.  P patient also has type 2 diabetes and is on an insulin mix 7030.  He is only on 10 units twice daily and on arrival A1c is quite elevated.  He states his blood sugars run anywhere from 2 50-300.  At the oncology center it was over 500 recently.  He cannot tolerate metformin.  He is on no other medications for his diabetes.  He cannot tolerate GLP's.  Patient's oncologic status is slowly getting under control and he is still on cyclic chemotherapy.  He underwent radiation to the brain and spine already.  Last CT of the chest showed stable disease.  Abdominal CT did not show abdominal or liver involvement. 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   Biomarker Findings Microsatellite status - MS-Stable Tumor Mutational Burden - 8 Muts/Mb Genomic Findings For a complete list of the genes assayed, please refer to the Appendix. KEAP1 Q227* CDKN2A/B [id number] G89V PRKCI amplification TERC amplification - equivocal? TP53 [id number]*18 8 Disease relevant genes with no reportable alterations: ALK, BRAF, EGFR, ERBB2, KRAS, MET, RET, ROS1   PDL1 TPS  90%    PRIOR THERAPY:  1) Status post palliative radiotherapy to the L2 lesion under the care of Dr. Kathrynn Running. 2) Status post SRS to solitary brain metastasis under the care of Dr. Kathrynn Running   CURRENT THERAPY: Palliative systemic therapy with carboplatin for AUC of 5, Alimta 500 Mg/M2 and Keytruda 200 Mg IV every 3 weeks.  First dose September 05, 2022.  Status post 6 cycles.  Starting from cycle #5 the patient will be on maintenance treatment with Alimta and Keytruda every 3 weeks.   INTERVAL HISTORY: Samuel [name] 57 y.o. male returns to the clinic today for follow-up visit.  The patient is feeling fine today with no concerning complaints except for occasional pain in the back.  He is followed by the palliative care team and currently on pain medication with MS Contin 15 mg p.o. twice daily in addition to Oxy IR for breakthrough pain management.  He denied having any current chest pain, shortness of breath, cough or hemoptysis.  He has no nausea, vomiting, diarrhea or constipation.  He has no headache or visual changes.  He has been tolerating his maintenance treatment with Alimta and Keytruda fairly well.  The patient had repeat CT scan of the chest, abdomen and pelvis performed recently and he is here for evaluation and discussion of his scan results.   MEDICAL HISTORY:     Past Medical History:  Diagnosis Date   Diabetes mellitus without complication (HCC)     Metastatic cancer to brain (HCC) 08/05/2022  Metastatic cancer to spine (HCC) 07/19/2022   Pancreatitis unk   Primary lung adenocarcinoma (HCC) 08/05/2022      ALLERGIES:  is allergic to penicillins and percocet [oxycodone-acetaminophen].   MEDICATIONS:        Current Outpatient Medications  Medication Sig Dispense Refill   acetaminophen (TYLENOL) 500 MG tablet Take 1,000 mg by mouth every 6 (six) hours as needed for mild pain or moderate pain.       atorvastatin (LIPITOR) 40 MG tablet Take 1 tablet (40 mg total) by mouth daily.  (Patient not taking: Reported on 12/19/2022) 30 tablet 2   blood glucose meter kit and supplies KIT Dispense based on patient and insurance preference. Use up to four times daily as directed. 1 each 0   Fingerstix Lancets MISC use as directed 100 each 0   folic acid (FOLVITE) 1 MG tablet Take 1 tablet (1 mg total) by mouth daily. 30 tablet 4   gabapentin (NEURONTIN) 300 MG capsule Take 2 capsules (600 mg total) by mouth 3 (three) times daily. 180 capsule 1   glucose blood test strip use as directed 100 each 0   insulin detemir (LEVEMIR FLEXPEN) 100 UNIT/ML FlexPen Inject into the skin.       Insulin Lispro Prot & Lispro (HUMALOG 75/25 MIX) (75-25) 100 UNIT/ML Kwikpen Inject 10 units into the skin 2 times daily with a meal 15 mL 5   Insulin Pen Needle 32G X 4 MM MISC Use with Insulin pen 100 each 3   ipratropium-albuterol (DUONEB) 0.5-2.5 (3) MG/3ML SOLN Take 3 mLs by nebulization every 6 (six) hours as needed (Shortness of breath). 360 mL 0   lidocaine-prilocaine (EMLA) cream Apply to the Port-A-Cath site 30-60 minutes before chemo. 30 g 0   morphine (MS CONTIN) 15 MG 12 hr tablet Take 1 tablet (15 mg total) by mouth every 12 (twelve) hours. 60 tablet 0   oxyCODONE (OXY IR/ROXICODONE) 5 MG immediate release tablet Take 1 - 2 tablets (5 - 10 mg total) by mouth every 4 hours as needed for severe pain. 120 tablet 0   prochlorperazine (COMPAZINE) 10 MG tablet Take 1 tablet (10 mg total) by mouth every 6 (six) hours as needed for nausea or vomiting. (Patient not taking: Reported on 12/19/2022) 30 tablet 0    No current facility-administered medications for this visit.      SURGICAL HISTORY:       Past Surgical History:  Procedure Laterality Date   IR BONE TUMOR(S)RF ABLATION   08/01/2022   IR IMAGING GUIDED PORT INSERTION   09/21/2022   IR KYPHO LUMBAR INC FX REDUCE BONE BX UNI/BIL CANNULATION INC/IMAGING   08/01/2022      REVIEW OF SYSTEMS:  Constitutional: positive for fatigue Eyes:  negative Ears, nose, mouth, throat, and face: negative Respiratory: negative Cardiovascular: negative Gastrointestinal: negative Genitourinary:negative Integument/breast: negative Hematologic/lymphatic: negative Musculoskeletal:positive for back pain Neurological: negative Behavioral/Psych: negative Endocrine: negative Allergic/Immunologic: negative    PHYSICAL EXAMINATION: General appearance: alert, cooperative, fatigued, and no distress Head: Normocephalic, without obvious abnormality, atraumatic Neck: no adenopathy, no JVD, supple, symmetrical, trachea midline, and thyroid not enlarged, symmetric, no tenderness/mass/nodules Lymph nodes: Cervical, supraclavicular, and axillary nodes normal. Resp: clear to auscultation bilaterally Back: symmetric, no curvature. ROM normal. No CVA tenderness. Cardio: regular rate and rhythm, S1, S2 normal, no murmur, click, rub or gallop GI: soft, non-tender; bowel sounds normal; no masses,  no organomegaly Extremities: extremities normal, atraumatic, no cyanosis or edema Neurologic: Alert and oriented X 3, normal  strength and tone. Normal symmetric reflexes. Normal coordination and gait   ECOG PERFORMANCE STATUS: 1 - Symptomatic but completely ambulatory   Blood pressure 97/86, pulse 100, temperature 98.6 F (37 C), temperature source Oral, resp. rate 17, weight 144 lb (65.3 kg), SpO2 100 %.   LABORATORY DATA: Recent Labs       Lab Results  Component Value Date    WBC 6.9 01/09/2023    HGB 12.4 (L) 01/09/2023    HCT 35.7 (L) 01/09/2023    MCV 99.2 01/09/2023    PLT 223 01/09/2023          Chemistry    Labs (Brief)          Component Value Date/Time    NA 134 (L) 12/19/2022 1024    NA 140 12/24/2020 1144    NA 142 08/02/2014 1649    K 3.8 12/19/2022 1024    K 4.2 08/02/2014 1649    CL 102 12/19/2022 1024    CL 109 (H) 08/02/2014 1649    CO2 27 12/19/2022 1024    CO2 21 08/02/2014 1649    BUN 11 12/19/2022 1024    BUN 7  12/24/2020 1144    BUN 11 08/02/2014 1649    CREATININE 0.58 (L) 12/19/2022 1024    CREATININE 0.92 08/02/2014 1649     Labs (Brief)          Component Value Date/Time    CALCIUM 9.2 12/19/2022 1024    CALCIUM 8.6 08/02/2014 1649    ALKPHOS 85 12/19/2022 1024    ALKPHOS 82 08/02/2014 1649    AST 21 12/19/2022 1024    ALT 25 12/19/2022 1024    ALT 132 (H) 08/02/2014 1649    BILITOT 0.3 12/19/2022 1024            RADIOGRAPHIC STUDIES:  Imaging Results  CT Chest W Contrast   Result Date: 01/08/2023 CLINICAL DATA:  Non-small-cell lung cancer. Restaging. * Tracking Code: BO * EXAM: CT CHEST, ABDOMEN, AND PELVIS WITH CONTRAST TECHNIQUE: Multidetector CT imaging of the chest, abdomen and pelvis was performed following the standard protocol during bolus administration of intravenous contrast. RADIATION DOSE REDUCTION: This exam was performed according to the departmental dose-optimization program which includes automated exposure control, adjustment of the mA and/or kV according to patient size and/or use of iterative reconstruction technique. CONTRAST:  80mL OMNIPAQUE IOHEXOL 300 MG/ML  SOLN COMPARISON:  11/03/2022 chest CT.  PET-CT 08/04/2022. FINDINGS: CT CHEST FINDINGS Cardiovascular: The heart size is normal. No substantial pericardial effusion. Mild atherosclerotic calcification is noted in the wall of the thoracic aorta. Right Port-A-Cath tip is positioned in the right atrium. Mediastinum/Nodes: No mediastinal lymphadenopathy. There is no hilar lymphadenopathy. The esophagus has normal imaging features. There is no axillary lymphadenopathy. Lungs/Pleura: Centrilobular and paraseptal emphysema evident. Musculoskeletal: The mixed lytic and sclerotic changes in the T3 vertebral body are similar to prior (see axial 27/4 and well demonstrated on sagittal 94/6). This was better evaluated by MRI on 11/25/2022. A T9 lesion seen on that study is not readily evident on CT imaging. No new worrisome  lytic or sclerotic osseous abnormality. Multiple bilateral nonacute rib fractures evident. CT ABDOMEN PELVIS FINDINGS Hepatobiliary: 4 mm hypodensity posterior right liver on 63/2 is stable since CT scan of 03/20/2020 consistent with benign etiology. There is no evidence for gallstones, gallbladder wall thickening, or pericholecystic fluid. No intrahepatic or extrahepatic biliary dilation. Pancreas: No focal mass lesion. No dilatation of the main duct. No intraparenchymal cyst. No  peripancreatic edema. Spleen: No splenomegaly. No focal mass lesion. Adrenals/Urinary Tract: No adrenal nodule or mass. 1-2 mm nonobstructing interpolar right renal stone noted on 64/2. Left kidney unremarkable. No evidence for hydroureter. The urinary bladder appears normal for the degree of distention. Stomach/Bowel: Stomach is distended with food and fluid. Duodenum is normally positioned as is the ligament of Treitz. No small bowel wall thickening. No small bowel dilatation. Neither the terminal ileum nor the appendix are discretely visible. Prominent stool volume noted transverse and left colon to the level of the rectum. Vascular/Lymphatic: There is moderate atherosclerotic calcification of the abdominal aorta without aneurysm. There is no gastrohepatic or hepatoduodenal ligament lymphadenopathy. No retroperitoneal or mesenteric lymphadenopathy. No pelvic sidewall lymphadenopathy. Reproductive: The prostate gland and seminal vesicles are unremarkable. Other: No intraperitoneal free fluid. Musculoskeletal: Status post vertebral augmentation at L2. Metastatic lesion in the L3 vertebral body again noted, better characterized on recent lumbar spine MRI of 11/25/2022. Body wall edema noted in the lower abdomen and pelvis. IMPRESSION: 1. Stable CT chest since 11/03/2022. No evidence for recurrent or metastatic disease in the thorax. 2. No findings to suggest metastatic disease in the abdomen or pelvis on today's study. 3. Multiple  metastatic lesions identified in the thoracolumbar spine, as characterized on MR imaging 11/25/2022. 4. 1-2 mm nonobstructing interpolar right renal stone. 5. Prominent stool volume transverse and left colon to the level of the rectum. Imaging features could be compatible with constipation in the appropriate clinical setting. 6. Aortic Atherosclerosis (ICD10-I70.0) and Emphysema (ICD10-J43.9). Electronically Signed   By: Kennith Center M.D.   On: 01/08/2023 07:19    CT Abdomen Pelvis W Contrast   Result Date: 01/08/2023 CLINICAL DATA:  Non-small-cell lung cancer. Restaging. * Tracking Code: BO * EXAM: CT CHEST, ABDOMEN, AND PELVIS WITH CONTRAST TECHNIQUE: Multidetector CT imaging of the chest, abdomen and pelvis was performed following the standard protocol during bolus administration of intravenous contrast. RADIATION DOSE REDUCTION: This exam was performed according to the departmental dose-optimization program which includes automated exposure control, adjustment of the mA and/or kV according to patient size and/or use of iterative reconstruction technique. CONTRAST:  80mL OMNIPAQUE IOHEXOL 300 MG/ML  SOLN COMPARISON:  11/03/2022 chest CT.  PET-CT 08/04/2022. FINDINGS: CT CHEST FINDINGS Cardiovascular: The heart size is normal. No substantial pericardial effusion. Mild atherosclerotic calcification is noted in the wall of the thoracic aorta. Right Port-A-Cath tip is positioned in the right atrium. Mediastinum/Nodes: No mediastinal lymphadenopathy. There is no hilar lymphadenopathy. The esophagus has normal imaging features. There is no axillary lymphadenopathy. Lungs/Pleura: Centrilobular and paraseptal emphysema evident. Musculoskeletal: The mixed lytic and sclerotic changes in the T3 vertebral body are similar to prior (see axial 27/4 and well demonstrated on sagittal 94/6). This was better evaluated by MRI on 11/25/2022. A T9 lesion seen on that study is not readily evident on CT imaging. No new worrisome  lytic or sclerotic osseous abnormality. Multiple bilateral nonacute rib fractures evident. CT ABDOMEN PELVIS FINDINGS Hepatobiliary: 4 mm hypodensity posterior right liver on 63/2 is stable since CT scan of 03/20/2020 consistent with benign etiology. There is no evidence for gallstones, gallbladder wall thickening, or pericholecystic fluid. No intrahepatic or extrahepatic biliary dilation. Pancreas: No focal mass lesion. No dilatation of the main duct. No intraparenchymal cyst. No peripancreatic edema. Spleen: No splenomegaly. No focal mass lesion. Adrenals/Urinary Tract: No adrenal nodule or mass. 1-2 mm nonobstructing interpolar right renal stone noted on 64/2. Left kidney unremarkable. No evidence for hydroureter. The urinary bladder appears normal for  the degree of distention. Stomach/Bowel: Stomach is distended with food and fluid. Duodenum is normally positioned as is the ligament of Treitz. No small bowel wall thickening. No small bowel dilatation. Neither the terminal ileum nor the appendix are discretely visible. Prominent stool volume noted transverse and left colon to the level of the rectum. Vascular/Lymphatic: There is moderate atherosclerotic calcification of the abdominal aorta without aneurysm. There is no gastrohepatic or hepatoduodenal ligament lymphadenopathy. No retroperitoneal or mesenteric lymphadenopathy. No pelvic sidewall lymphadenopathy. Reproductive: The prostate gland and seminal vesicles are unremarkable. Other: No intraperitoneal free fluid. Musculoskeletal: Status post vertebral augmentation at L2. Metastatic lesion in the L3 vertebral body again noted, better characterized on recent lumbar spine MRI of 11/25/2022. Body wall edema noted in the lower abdomen and pelvis. IMPRESSION: 1. Stable CT chest since 11/03/2022. No evidence for recurrent or metastatic disease in the thorax. 2. No findings to suggest metastatic disease in the abdomen or pelvis on today's study. 3. Multiple  metastatic lesions identified in the thoracolumbar spine, as characterized on MR imaging 11/25/2022. 4. 1-2 mm nonobstructing interpolar right renal stone. 5. Prominent stool volume transverse and left colon to the level of the rectum. Imaging features could be compatible with constipation in the appropriate clinical setting. 6. Aortic Atherosclerosis (ICD10-I70.0) and Emphysema (ICD10-J43.9). Electronically Signed   By: Kennith Center M.D.   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 He has molecular studies by foundation 1 that showed no actionable mutations and PD-L1 expression was 90%. He underwent palliative radiotherapy to the L2 vertebral body compression fraction as well as solitary brain metastasis with SRS. He is currently undergoing palliative systemic chemotherapy with carboplatin for AUC of 5, Alimta 500 Mg/M2 and Keytruda 200 Mg IV every 3 weeks.  Status post 6 cycles.  Starting from cycle #5 he is on treatment with maintenance Alimta and Keytruda every 3 weeks. The patient has been tolerating this treatment fairly well with no concerning adverse effects. He had repeat CT scan of the chest, abdomen and pelvis performed recently.  I personally and independently reviewed the scan and discussed the results with the patient today. His scan showed no concerning findings for disease progression. I recommended for the patient to continue his current treatment with the maintenance Alimta and Keytruda every 3 weeks. I will see him back for follow-up visit in 3  weeks for evaluation before the next cycle of his treatment. For the pain management, he is currently on MS Contin 15 mg p.o. twice daily in addition to Oxy IR for breakthrough pain and managed by the palliative care team.  The patient was advised to call immediately if he has any other concerning symptoms in the interval. The patient voices understanding of current disease status and treatment options and is in agreement with the current care plan.   All questions were answered. The patient knows to call the clinic with any problems, questions or concerns. We can certainly see the patient much sooner if necessary.   The total time spent in the appointment was 30 minutes.  Disclaimer: This note was  dictated with voice recognition software. Similar sounding words can inadvertently be transcribed and may not be corrected upon review.      Below is a note from palliative medicine Neoplasm related pain [place] reports pain is well controlled. Tolerating MS Contin and oxycodone for breakthrough pain. He is only taking MS Contin at bedtime given improvement in pain.  Pain tends to be worse later in the evening and during the night. Not requiring breakthrough medication around the clock. He is appropriately taking medications and refills in line with administration schedule.    Patient pain contract was signed at previous visit on 10/23.   2.  Appetite After diet continues to improve.  Current weight 144 pounds up from 137 pounds on 1/29.   PLAN: MS Contin 15 mg at bedtime only  Oxy IR 5-10 mg every 4-6 hours as needed for breakthrough pain.  Is only taken 1-2 times daily. Colace twice daily Narcan for emergency use Appetite much improved.Weight is increasing. Pain contract signed on 10/23. I will plan to see patient back in 3-4 weeks in collaboration with his other oncology appointments.      04/11/23 Saw Luke 02/2023  Diabetes longstanding currently above goal with A1c 8.6%. Patient is able  to verbalize appropriate hypoglycemia management plan. Medication adherence appears appropriate. Control is suboptimal due to missing evening insulin doses. -Started basal insulin Lantus (insulin glargine) 40 units daily in the morning.  -Stop Novolog 70/30 -Continued SGLT2-I Jardiance (empagliflozin) 10 mg. Counseled on sick day rules. -Patient educated on purpose, proper use, and potential adverse effects of Jardiance.  -Extensively discussed pathophysiology of diabetes, recommended lifestyle interventions, dietary effects on blood sugar control.  -Counseled on s/sx of and management of hypoglycemia.  -Next A1c anticipated today.    Outpatient Encounter Medications as of 04/11/2023  Medication Sig   Accu-Chek Softclix Lancets lancets Use to check blood sugar 3 times daily. E11.69   atorvastatin (LIPITOR) 40 MG tablet Take 1 tablet (40 mg total) by mouth daily.   blood glucose meter kit and supplies KIT Dispense based on patient and insurance preference. Use up to four times daily as directed.   Blood Glucose Monitoring Suppl (ACCU-CHEK GUIDE) w/Device KIT Use to check blood sugar 3 times daily. E11.69   empagliflozin (JARDIANCE) 10 MG TABS tablet Take 1 tablet (10 mg total) by mouth daily before breakfast.   folic acid (FOLVITE) 1 MG tablet Take 1 tablet (1 mg total) by mouth daily.   gabapentin (NEURONTIN) 300 MG capsule Take 2 capsules (600 mg total) by mouth 3 (three) times daily.   glucose blood (ACCU-CHEK GUIDE) test strip Use to check blood sugar 3 times daily. E11.69   insulin glargine (LANTUS SOLOSTAR) 100 UNIT/ML Solostar Pen Inject 40 Units into the skin daily.   Insulin Pen Needle 32G X 4 MM MISC Use with Insulin pen   ipratropium-albuterol (DUONEB) 0.5-2.5 (3) MG/3ML SOLN Take 3 mLs by nebulization every 6 (six) hours as needed (Shortness of breath).   lidocaine-prilocaine (EMLA) cream Apply to the Port-A-Cath site 30-60 minutes before chemo.   Oxycodone HCl 10 MG TABS Take 1  tablet (10 mg) by mouth every 6 hours as needed for severe pain.   prochlorperazine (COMPAZINE) 10 MG tablet Take 1 tablet (10 mg total) by mouth every 6 (six) hours as needed for nausea or vomiting.   tiZANidine (ZANAFLEX) 2 MG tablet Take 1 tablet (2 mg) by mouth at bedtime.   No facility-administered encounter medications on file as of 04/11/2023.  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 06/13/2022 A1C 13.8, FSBG 414 Start empagliflozin 5mg /metformin 1000 daily, levemir 20 qhs. (Samples given) Will apply for medassist for pharmacy    Past Surgical History:  Procedure Laterality Date   IR BONE TUMOR(S)RF ABLATION  08/01/2022   IR IMAGING GUIDED PORT INSERTION  09/21/2022   IR KYPHO LUMBAR INC FX REDUCE BONE BX UNI/BIL CANNULATION INC/IMAGING  08/01/2022    No family history on file.  Social History   Socioeconomic History   Marital status: Single    Spouse name: Not on file   Number of children: Not on file   Years of education: Not on file   Highest education level: Associate degree: academic program  Occupational History   Not on file  Tobacco Use   Smoking status: Every Day    Packs/day: 1    Types: Cigarettes    Last attempt to quit: 08/17/2022    Years since quitting: 0.6   Smokeless tobacco: Never  Vaping Use   Vaping Use: Never used  Substance and Sexual Activity   Alcohol use: Yes    Alcohol/week: 50.0 standard drinks of alcohol    Types: 50 Cans of beer per week    Comment: last thursday - a couple of beers    Drug use: No   Sexual activity: Yes  Other Topics Concern   Not on file  Social History Narrative   Not on file   Social Determinants of Health   Financial Resource Strain: Medium Risk (03/17/2023)   Overall Financial Resource Strain (CARDIA)    Difficulty of Paying Living Expenses: Somewhat hard  Food Insecurity: Food Insecurity Present (03/17/2023)   Hunger Vital Sign    Worried About Running Out of Food in the Last Year: Sometimes true    Ran Out of Food in the Last Year: Never true  Transportation Needs: No Transportation Needs (03/17/2023)   PRAPARE - Administrator, Civil Service (Medical): No    Lack of Transportation (Non-Medical): No  Physical Activity: Insufficiently Active (03/17/2023)   Exercise Vital Sign    Days of Exercise per Week: 2 days    Minutes of Exercise per Session: 20 min  Stress: Stress Concern Present (03/17/2023)   [employer] of Occupational Health - Occupational Stress Questionnaire    Feeling of Stress : To some extent  Social Connections: Moderately Isolated (03/17/2023)   Social Connection and Isolation Panel [NHANES]    Frequency of Communication with Friends and Family: More than three times a week    Frequency of Social Gatherings with Friends and Family: More than three times a week    Attends Religious Services: More than 4 times per year    Active Member of [employer] or Organizations: No    Attends Banker Meetings: Not on file    Marital Status: Divorced  Intimate Partner Violence: Not At Risk (03/08/2023)   Humiliation, Afraid, Rape, and Kick questionnaire    Fear of Current or Ex-Partner: No    Emotionally Abused: No  Physically Abused: No    Sexually Abused: No    Review of Systems  Constitutional:  Positive for malaise/fatigue. Negative for chills, diaphoresis, fever and weight loss.  HENT:  Negative for congestion, hearing loss, nosebleeds, sore throat and tinnitus.   Eyes:  Negative for blurred vision,  photophobia and redness.  Respiratory:  Positive for shortness of breath. Negative for cough, hemoptysis, sputum production, wheezing and stridor.   Cardiovascular:  Negative for chest pain, palpitations, orthopnea, claudication, leg swelling and PND.  Gastrointestinal:  Negative for abdominal pain, blood in stool, constipation, diarrhea, heartburn, nausea and vomiting.  Genitourinary:  Negative for dysuria, flank pain, frequency, hematuria and urgency.  Musculoskeletal:  Positive for back pain and neck pain. Negative for falls, joint pain and myalgias.  Skin:  Negative for itching and rash.  Neurological:  Negative for dizziness, tingling, tremors, sensory change, speech change, focal weakness, seizures, loss of consciousness, weakness and headaches.  Endo/Heme/Allergies:  Negative for environmental allergies and polydipsia. Does not bruise/bleed easily.  Psychiatric/Behavioral:  Negative for depression, memory loss, substance abuse and suicidal ideas. The patient is not nervous/anxious and does not have insomnia.         Objective    There were no vitals taken for this visit.  Physical Exam Vitals reviewed.  Constitutional:      Appearance: Normal appearance. He is well-developed. He is not diaphoretic.  HENT:     Head: Normocephalic and atraumatic.     Nose: No nasal deformity, septal deviation, mucosal edema or rhinorrhea.     Right Sinus: No maxillary sinus tenderness or frontal sinus tenderness.     Left Sinus: No maxillary sinus tenderness or frontal sinus tenderness.     Mouth/Throat:     Pharynx: No oropharyngeal exudate.     Comments: Poor dentition Eyes:     General: No scleral icterus.    Conjunctiva/sclera: Conjunctivae normal.     Pupils: Pupils are equal, round, and reactive to light.  Neck:     Thyroid: No thyromegaly.     Vascular: No carotid bruit or JVD.     Trachea: Trachea normal. No tracheal tenderness or tracheal deviation.  Cardiovascular:     Rate and  Rhythm: Normal rate and regular rhythm.     Chest Wall: PMI is not displaced.     Pulses: Normal pulses. No decreased pulses.     Heart sounds: Normal heart sounds, S1 normal and S2 normal. Heart sounds not distant. No murmur heard.    No systolic murmur is present.     No diastolic murmur is present.     No friction rub. No gallop. No S3 or S4 sounds.  Pulmonary:     Effort: No tachypnea, accessory muscle usage or respiratory distress.     Breath sounds: No stridor. No decreased breath sounds, wheezing, rhonchi or rales.  Chest:     Chest wall: No tenderness.  Abdominal:     General: Bowel sounds are normal. There is no distension.     Palpations: Abdomen is soft. Abdomen is not rigid.     Tenderness: There is no abdominal tenderness. There is no guarding or rebound.  Musculoskeletal:        General: Tenderness present. Normal range of motion.     Cervical back: Normal range of motion and neck supple. No edema, erythema or rigidity. No muscular tenderness. Normal range of motion.     Right lower leg: Edema present.     Left lower leg: Edema present.  Comments: Foot exam normal  Lymphadenopathy:     Head:     Right side of head: No submental or submandibular adenopathy.     Left side of head: No submental or submandibular adenopathy.     Cervical: No cervical adenopathy.  Skin:    General: Skin is warm and dry.     Coloration: Skin is not pale.     Findings: No rash.     Nails: There is no clubbing.  Neurological:     Mental Status: He is alert and oriented to person, place, and time.     Sensory: No sensory deficit.  Psychiatric:        Speech: Speech normal.        Behavior: Behavior normal.         Assessment & Plan:   Problem List Items Addressed This Visit   None 1 hour spent prolonged time due to multiple records review's high degree of complexity Referral sent to optometry for eye exam No follow-ups on file.   Shan Levans, MD

## 2023-04-11 ENCOUNTER — Other Ambulatory Visit (HOSPITAL_COMMUNITY): Payer: Self-pay

## 2023-04-11 ENCOUNTER — Encounter: Payer: Self-pay | Admitting: Critical Care Medicine

## 2023-04-11 ENCOUNTER — Encounter: Payer: Self-pay | Admitting: Internal Medicine

## 2023-04-11 ENCOUNTER — Ambulatory Visit: Payer: Medicaid Other | Attending: Critical Care Medicine | Admitting: Critical Care Medicine

## 2023-04-11 ENCOUNTER — Other Ambulatory Visit: Payer: Self-pay

## 2023-04-11 VITALS — BP 114/79 | HR 84 | Wt 146.6 lb

## 2023-04-11 DIAGNOSIS — F17210 Nicotine dependence, cigarettes, uncomplicated: Secondary | ICD-10-CM | POA: Diagnosis not present

## 2023-04-11 DIAGNOSIS — Z713 Dietary counseling and surveillance: Secondary | ICD-10-CM | POA: Insufficient documentation

## 2023-04-11 DIAGNOSIS — Z85118 Personal history of other malignant neoplasm of bronchus and lung: Secondary | ICD-10-CM | POA: Insufficient documentation

## 2023-04-11 DIAGNOSIS — F17200 Nicotine dependence, unspecified, uncomplicated: Secondary | ICD-10-CM

## 2023-04-11 DIAGNOSIS — C3490 Malignant neoplasm of unspecified part of unspecified bronchus or lung: Secondary | ICD-10-CM

## 2023-04-11 DIAGNOSIS — E1169 Type 2 diabetes mellitus with other specified complication: Secondary | ICD-10-CM | POA: Diagnosis not present

## 2023-04-11 DIAGNOSIS — E119 Type 2 diabetes mellitus without complications: Secondary | ICD-10-CM | POA: Insufficient documentation

## 2023-04-11 DIAGNOSIS — C7931 Secondary malignant neoplasm of brain: Secondary | ICD-10-CM | POA: Diagnosis not present

## 2023-04-11 DIAGNOSIS — C7951 Secondary malignant neoplasm of bone: Secondary | ICD-10-CM | POA: Diagnosis not present

## 2023-04-11 DIAGNOSIS — Z794 Long term (current) use of insulin: Secondary | ICD-10-CM

## 2023-04-11 LAB — GLUCOSE, POCT (MANUAL RESULT ENTRY): POC Glucose: 232 mg/dl — AB (ref 70–99)

## 2023-04-11 MED ORDER — SILDENAFIL CITRATE 100 MG PO TABS
50.0000 mg | ORAL_TABLET | Freq: Every day | ORAL | 11 refills | Status: DC | PRN
  Filled 2023-04-11: qty 5, 5d supply, fill #0

## 2023-04-11 MED ORDER — NICOTINE POLACRILEX 4 MG MT LOZG
LOZENGE | OROMUCOSAL | 4 refills | Status: DC
  Filled 2023-04-11: qty 72, 24d supply, fill #0
  Filled 2023-05-15: qty 72, 24d supply, fill #1

## 2023-04-11 NOTE — Assessment & Plan Note (Signed)
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

## 2023-04-11 NOTE — Assessment & Plan Note (Signed)
Still smoking    Current smoking consumption amount: Half a pack a day  Dicsussion on advise to quit smoking and smoking impacts: Cardiovascular lung cancer impacts  Patient's willingness to quit: Motivated to quit  Methods to quit smoking discussed: Behavioral modification  Medication management of smoking session drugs discussed: Nicotine patch  Resources provided:  AVS   Setting quit date not established  Follow-up arranged 2 months   Time spent counseling the patient: 5 minutes  

## 2023-04-11 NOTE — Assessment & Plan Note (Signed)
Care per oncology

## 2023-04-11 NOTE — Assessment & Plan Note (Signed)
Not controlled continue current treatment plan

## 2023-04-11 NOTE — Patient Instructions (Signed)
  Take the Tylenol to 500 mg 3 times daily and use your oxycodone as needed  Continue lidocaine patch to the lower back  Take your gabapentin 3 times a day  Viagra was prescribed  No change in your other medications  Return 3 months  Reduce your tobacco intake  Keep your follow-ups with oncology

## 2023-04-12 DIAGNOSIS — H4422 Degenerative myopia, left eye: Secondary | ICD-10-CM | POA: Insufficient documentation

## 2023-04-12 LAB — HM DIABETES EYE EXAM

## 2023-04-24 ENCOUNTER — Other Ambulatory Visit: Payer: Self-pay | Admitting: Radiation Therapy

## 2023-04-25 ENCOUNTER — Other Ambulatory Visit: Payer: Self-pay

## 2023-04-25 ENCOUNTER — Inpatient Hospital Stay (HOSPITAL_BASED_OUTPATIENT_CLINIC_OR_DEPARTMENT_OTHER): Payer: Medicaid Other | Admitting: Nurse Practitioner

## 2023-04-25 ENCOUNTER — Encounter: Payer: Self-pay | Admitting: Critical Care Medicine

## 2023-04-25 ENCOUNTER — Encounter: Payer: Self-pay | Admitting: Internal Medicine

## 2023-04-25 ENCOUNTER — Inpatient Hospital Stay: Payer: Medicaid Other

## 2023-04-25 ENCOUNTER — Encounter: Payer: Self-pay | Admitting: Nurse Practitioner

## 2023-04-25 ENCOUNTER — Inpatient Hospital Stay: Payer: Medicaid Other | Attending: Adult Health

## 2023-04-25 ENCOUNTER — Other Ambulatory Visit (HOSPITAL_COMMUNITY): Payer: Self-pay

## 2023-04-25 ENCOUNTER — Inpatient Hospital Stay (HOSPITAL_BASED_OUTPATIENT_CLINIC_OR_DEPARTMENT_OTHER): Payer: Medicaid Other | Admitting: Internal Medicine

## 2023-04-25 VITALS — BP 110/76 | HR 90 | Temp 98.7°F | Resp 16

## 2023-04-25 VITALS — BP 106/73 | HR 94 | Temp 97.9°F | Resp 18 | Ht 68.0 in | Wt 146.6 lb

## 2023-04-25 DIAGNOSIS — Z7984 Long term (current) use of oral hypoglycemic drugs: Secondary | ICD-10-CM | POA: Diagnosis not present

## 2023-04-25 DIAGNOSIS — C3490 Malignant neoplasm of unspecified part of unspecified bronchus or lung: Secondary | ICD-10-CM

## 2023-04-25 DIAGNOSIS — C7951 Secondary malignant neoplasm of bone: Secondary | ICD-10-CM | POA: Insufficient documentation

## 2023-04-25 DIAGNOSIS — G893 Neoplasm related pain (acute) (chronic): Secondary | ICD-10-CM

## 2023-04-25 DIAGNOSIS — Z79899 Other long term (current) drug therapy: Secondary | ICD-10-CM | POA: Insufficient documentation

## 2023-04-25 DIAGNOSIS — M792 Neuralgia and neuritis, unspecified: Secondary | ICD-10-CM

## 2023-04-25 DIAGNOSIS — E119 Type 2 diabetes mellitus without complications: Secondary | ICD-10-CM | POA: Diagnosis not present

## 2023-04-25 DIAGNOSIS — Z515 Encounter for palliative care: Secondary | ICD-10-CM

## 2023-04-25 DIAGNOSIS — Z5112 Encounter for antineoplastic immunotherapy: Secondary | ICD-10-CM | POA: Insufficient documentation

## 2023-04-25 DIAGNOSIS — C7931 Secondary malignant neoplasm of brain: Secondary | ICD-10-CM | POA: Insufficient documentation

## 2023-04-25 DIAGNOSIS — E113599 Type 2 diabetes mellitus with proliferative diabetic retinopathy without macular edema, unspecified eye: Secondary | ICD-10-CM | POA: Insufficient documentation

## 2023-04-25 LAB — CBC WITH DIFFERENTIAL (CANCER CENTER ONLY)
Abs Immature Granulocytes: 0.04 10*3/uL (ref 0.00–0.07)
Basophils Absolute: 0 10*3/uL (ref 0.0–0.1)
Basophils Relative: 1 %
Eosinophils Absolute: 0 10*3/uL (ref 0.0–0.5)
Eosinophils Relative: 0 %
HCT: 39.6 % (ref 39.0–52.0)
Hemoglobin: 13.9 g/dL (ref 13.0–17.0)
Immature Granulocytes: 1 %
Lymphocytes Relative: 15 %
Lymphs Abs: 1.2 10*3/uL (ref 0.7–4.0)
MCH: 34.2 pg — ABNORMAL HIGH (ref 26.0–34.0)
MCHC: 35.1 g/dL (ref 30.0–36.0)
MCV: 97.3 fL (ref 80.0–100.0)
Monocytes Absolute: 0.7 10*3/uL (ref 0.1–1.0)
Monocytes Relative: 8 %
Neutro Abs: 6.2 10*3/uL (ref 1.7–7.7)
Neutrophils Relative %: 75 %
Platelet Count: 237 10*3/uL (ref 150–400)
RBC: 4.07 MIL/uL — ABNORMAL LOW (ref 4.22–5.81)
RDW: 12.7 % (ref 11.5–15.5)
WBC Count: 8.2 10*3/uL (ref 4.0–10.5)
nRBC: 0 % (ref 0.0–0.2)

## 2023-04-25 LAB — CMP (CANCER CENTER ONLY)
ALT: 30 U/L (ref 0–44)
AST: 23 U/L (ref 15–41)
Albumin: 3.8 g/dL (ref 3.5–5.0)
Alkaline Phosphatase: 69 U/L (ref 38–126)
Anion gap: 6 (ref 5–15)
BUN: 14 mg/dL (ref 6–20)
CO2: 28 mmol/L (ref 22–32)
Calcium: 10 mg/dL (ref 8.9–10.3)
Chloride: 102 mmol/L (ref 98–111)
Creatinine: 0.61 mg/dL (ref 0.61–1.24)
GFR, Estimated: >60 mL/min (ref >60)
Glucose, Bld: 291 mg/dL — ABNORMAL HIGH (ref 70–99)
Potassium: 3.8 mmol/L (ref 3.5–5.1)
Sodium: 136 mmol/L (ref 135–145)
Total Bilirubin: 0.4 mg/dL (ref 0.3–1.2)
Total Protein: 7.2 g/dL (ref 6.5–8.1)

## 2023-04-25 LAB — TSH: TSH: 0.871 u[IU]/mL (ref 0.350–4.500)

## 2023-04-25 MED ORDER — OXYCODONE HCL 10 MG PO TABS
10.0000 mg | ORAL_TABLET | Freq: Four times a day (QID) | ORAL | 0 refills | Status: DC | PRN
Start: 2023-04-25 — End: 2023-05-15
  Filled 2023-04-25: qty 33, 8d supply, fill #0
  Filled 2023-04-25: qty 27, 7d supply, fill #0

## 2023-04-25 MED ORDER — SODIUM CHLORIDE 0.9 % IV SOLN
200.0000 mg | Freq: Once | INTRAVENOUS | Status: AC
  Administered 2023-04-25: 200 mg via INTRAVENOUS
  Filled 2023-04-25: qty 200

## 2023-04-25 MED ORDER — SODIUM CHLORIDE 0.9% FLUSH
10.0000 mL | INTRAVENOUS | Status: DC | PRN
  Administered 2023-04-25: 10 mL

## 2023-04-25 MED ORDER — SODIUM CHLORIDE 0.9 % IV SOLN: Freq: Once | INTRAVENOUS | Status: AC

## 2023-04-25 MED ORDER — HEPARIN SOD (PORK) LOCK FLUSH 100 UNIT/ML IV SOLN
500.0000 [IU] | Freq: Once | INTRAVENOUS | Status: AC | PRN
  Administered 2023-04-25: 500 [IU]

## 2023-04-25 MED ORDER — PROCHLORPERAZINE MALEATE 10 MG PO TABS
10.0000 mg | ORAL_TABLET | Freq: Once | ORAL | Status: AC
  Administered 2023-04-25: 10 mg via ORAL
  Filled 2023-04-25: qty 1

## 2023-04-25 MED ORDER — SILDENAFIL CITRATE 100 MG PO TABS
50.0000 mg | ORAL_TABLET | Freq: Every day | ORAL | 0 refills | Status: DC | PRN
  Filled 2023-04-25: qty 15, 15d supply, fill #0

## 2023-04-25 MED ORDER — CYANOCOBALAMIN 1000 MCG/ML IJ SOLN
1000.0000 ug | Freq: Once | INTRAMUSCULAR | Status: AC
  Administered 2023-04-25: 1000 ug via INTRAMUSCULAR
  Filled 2023-04-25: qty 1

## 2023-04-25 MED ORDER — SODIUM CHLORIDE 0.9 % IV SOLN
500.0000 mg/m2 | Freq: Once | INTRAVENOUS | Status: AC
  Administered 2023-04-25: 900 mg via INTRAVENOUS
  Filled 2023-04-25: qty 36

## 2023-04-25 NOTE — Patient Instructions (Signed)
[facility]  Discharge Instructions: Thank you for choosing [facility] to provide your oncology and hematology care.   If you have a lab appointment with the Cancer Center, please go directly to the Cancer Center and check in at the registration area.   Wear comfortable clothing and clothing appropriate for easy access to any Portacath or PICC line.   We strive to give you quality time with your provider. You may need to reschedule your appointment if you arrive late (15 or more minutes).  Arriving late affects you and other patients whose appointments are after yours.  Also, if you miss three or more appointments without notifying the office, you may be dismissed from the clinic at the provider's discretion.      For prescription refill requests, have your pharmacy contact our office and allow 72 hours for refills to be completed.    Today you received the following chemotherapy and/or immunotherapy agents: Keytruda, Alimta.       To help prevent nausea and vomiting after your treatment, we encourage you to take your nausea medication as directed.  BELOW ARE SYMPTOMS THAT SHOULD BE REPORTED IMMEDIATELY: *FEVER GREATER THAN 100.4 F (38 C) OR HIGHER *CHILLS OR SWEATING *NAUSEA AND VOMITING THAT IS NOT CONTROLLED WITH YOUR NAUSEA MEDICATION *UNUSUAL SHORTNESS OF BREATH *UNUSUAL BRUISING OR BLEEDING *URINARY PROBLEMS (pain or burning when urinating, or frequent urination) *BOWEL PROBLEMS (unusual diarrhea, constipation, pain near the anus) TENDERNESS IN MOUTH AND THROAT WITH OR WITHOUT PRESENCE OF ULCERS (sore throat, sores in mouth, or a toothache) UNUSUAL RASH, SWELLING OR PAIN  UNUSUAL VAGINAL DISCHARGE OR ITCHING   Items with * indicate a potential emergency and should be followed up as soon as possible or go to the Emergency Department if any problems should occur.  Please show the CHEMOTHERAPY ALERT CARD or IMMUNOTHERAPY ALERT CARD  at check-in to the Emergency Department and triage nurse.  Should you have questions after your visit or need to cancel or reschedule your appointment, please contact [facility]  Dept: [phone]  and follow the prompts.  Office hours are 8:00 a.m. to 4:30 p.m. Monday - Friday. Please note that voicemails left after 4:00 p.m. may not be returned until the following business day.  We are closed weekends and major holidays. You have access to a nurse at all times for urgent questions. Please call the main number to the clinic Dept: [phone] and follow the prompts.   For any non-urgent questions, you may also contact your provider using MyChart. We now offer e-Visits for anyone 18 and older to request care online for non-urgent symptoms. For details visit [email or web address].   Also download the MyChart app! Go to the app store, search "MyChart", open the app, select [facility], and log in with your MyChart username and password.   

## 2023-04-25 NOTE — Progress Notes (Signed)
Palliative Medicine [facility]  [phone] [phone]) [phone]   Name: Samuel [name] Date: 04/25/2023 MRN: [id number]  DOB: 02/27/1966  Patient Care Team: [name], MD as PCP - General (Pulmonary Disease) Rema Fendt, NP as Nurse Practitioner (Nurse Practitioner) Valerie Roys, Arty Baumgartner, NP as Nurse Practitioner (Nurse Practitioner)    INTERVAL HISTORY: Samuel [name] is a 57 y.o. 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  Palliative ask to see for symptom management and goals of care.   SOCIAL HISTORY:     reports that he has been smoking cigarettes. He has been smoking an average of 1 pack per day. He has never used smokeless tobacco. He reports current alcohol use of about 50.0 standard drinks of alcohol per week. He reports that he does not use drugs.  ADVANCE DIRECTIVES:  Patient does not have an advanced directives. Education and packet provided. He has expressed interest in completing. States his sister, Samuel [name] would be his designated Clinical research associate.   CODE STATUS: Full code  PAST MEDICAL HISTORY: Past Medical History:  Diagnosis Date   Alcohol use disorder, severe, dependence (HCC) 12/26/2016   Diabetes mellitus without complication (HCC)    Metastatic cancer to brain (HCC) 08/05/2022   Metastatic cancer to spine (HCC) 07/19/2022   Pancreatitis unk   Primary lung adenocarcinoma (HCC) 08/05/2022   Suicidal ideation    Type 2 diabetes mellitus with hyperglycemia, without long-term current use of insulin (HCC) 06/13/2022   Formatting of this note might be different from the original. 06/13/2022 A1C 13.8, FSBG 414 Start empagliflozin 5mg /metformin 1000 daily, levemir 20 qhs. (Samples given) Will apply for medassist for pharmacy    ALLERGIES:  is allergic to penicillins and  percocet [oxycodone-acetaminophen].  MEDICATIONS:  Current Outpatient Medications  Medication Sig Dispense Refill   Accu-Chek Softclix Lancets lancets Use to check blood sugar 3 times daily. E11.69 100 each 6   atorvastatin (LIPITOR) 40 MG tablet Take 1 tablet (40 mg total) by mouth daily. 90 tablet 1   blood glucose meter kit and supplies KIT Dispense based on patient and insurance preference. Use up to four times daily as directed. 1 each 0   Blood Glucose Monitoring Suppl (ACCU-CHEK GUIDE) w/Device KIT Use to check blood sugar 3 times daily. E11.69 1 kit 0   empagliflozin (JARDIANCE) 10 MG TABS tablet Take 1 tablet (10 mg total) by mouth daily before breakfast. 60 tablet 4   folic acid (FOLVITE) 1 MG tablet Take 1 tablet (1 mg total) by mouth daily. 30 tablet 4   gabapentin (NEURONTIN) 300 MG capsule Take 2 capsules (600 mg total) by mouth 3 (three) times daily. 180 capsule 1   glucose blood (ACCU-CHEK GUIDE) test strip Use to check blood sugar 3 times daily. E11.69 100 each 6   insulin glargine (LANTUS SOLOSTAR) 100 UNIT/ML Solostar Pen Inject 40 Units into the skin daily. 15 mL 11   Insulin Pen Needle 32G X 4 MM MISC Use with Insulin pen 100 each 3   ipratropium-albuterol (DUONEB) 0.5-2.5 (3) MG/3ML SOLN Take 3 mLs by nebulization every 6 (six) hours as needed (Shortness of breath). 360 mL 0   lidocaine-prilocaine (EMLA) cream Apply to the Port-A-Cath site 30-60 minutes before chemo. 30 g 0   nicotine polacrilex (NICORETTE MINI) 4 MG lozenge Use 3 times a day as directed to  quit smoking 100 tablet 4   Oxycodone HCl 10 MG TABS Take 1 tablet (10 mg) by mouth every 6 hours as needed for severe pain. 60 tablet 0   prochlorperazine (COMPAZINE) 10 MG tablet Take 1 tablet (10 mg total) by mouth every 6 (six) hours as needed for nausea or vomiting. 30 tablet 0   sildenafil (VIAGRA) 100 MG tablet Take 1/2 - 1 tablet by mouth daily as needed for erectile dysfunction. 5 tablet 11   tiZANidine  (ZANAFLEX) 2 MG tablet Take 1 tablet (2 mg) by mouth at bedtime. 30 tablet 1   No current facility-administered medications for this visit.    VITAL SIGNS: There were no vitals taken for this visit. There were no vitals filed for this visit.  Estimated body mass index is 22.29 kg/m as calculated from the following:   Height as of 04/05/23: 5\' 8"  (1.727 m).   Weight as of 04/11/23: 146 lb 9.6 oz (66.5 kg).   PERFORMANCE STATUS (ECOG) : 1 - Symptomatic but completely ambulatory   Physical Exam General: NAD Cardiovascular: regular rate and rhythm Pulmonary:normal breathing pattern  Extremities: no edema, no joint deformities, brace in place  Skin: no rashes Neurological: AAO x4  IMPRESSION: Samuel [name] presents to clinic today for follow-up. No acute distress. His Significant Other is present. Overall he is doing well. Shares upcoming beach trip to celebrate his birthday. Denies nausea, vomiting, constipation, or diarrhea.   Neoplasm related pain Kern reports pain is well controlled on current regimen. MS Contin has been successfully discontinued.   Oxycodone 10 mg  as needed. He is not requiring daily. Gabapentin as prescribed. Taking all medications as prescribed.   We will continue to closely monitor.   Pain contract on file.    2.  Appetite Continues to improve.  Weight is stable at 146lbs.  Expresses appreciation of being able to eat foods that he wants enjoyed.  PLAN: MS Contin discontinued.  Patient has successfully weaned appropriately. Oxy IR 10 mg every 6 hours as needed for breakthrough pain. Not requiring daily.  Colace twice daily Gabapentin 600 mg three times daily  Narcan for emergency use Appetite much improved.Weight is stable. Pain contract signed on 10/23. I will plan to see patient back in 3-4 weeks in collaboration with his other oncology appointments.   Patient expressed understanding and was in agreement with this plan. He also understands that He  can call the clinic at any time with any questions, concerns, or complaints.       Any controlled substances utilized were prescribed in the context of palliative care. PDMP has been reviewed.    Visit consisted of counseling and education dealing with the complex and emotionally intense issues of symptom management and palliative care in the setting of serious and potentially life-threatening illness.Greater than 50%  of this time was spent counseling and coordinating care related to the above assessment and plan.  Willette Alma, AGPCNP-BC  Palliative Medicine Team/[facility]  *Please note that this is a verbal dictation therefore any spelling or grammatical errors are due to the "Dragon [employer] One" system interpretation.

## 2023-04-25 NOTE — Progress Notes (Signed)
[facility] Telephone:(336) [phone]   Fax:(336) [phone]  OFFICE PROGRESS NOTE  [name], MD 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  Biomarker Findings Microsatellite status - MS-Stable Tumor Mutational Burden - 8 Muts/Mb Genomic Findings For a complete list of the genes assayed, please refer to the Appendix. KEAP1 Q227* CDKN2A/B p16INK4a G89V PRKCI amplification TERC amplification - equivocal? TP53 D220fs*18 8 Disease relevant genes with no reportable alterations: ALK, BRAF, EGFR, ERBB2, KRAS, MET, RET, ROS1  PDL1 TPS  90%  PRIOR THERAPY:  1) Status post palliative radiotherapy to the L2 lesion under the care of Dr. Kathrynn Running. 2) Status post SRS to solitary brain metastasis under the care of Dr. Kathrynn Running  CURRENT THERAPY: Palliative systemic therapy with carboplatin for AUC of 5, Alimta 500 Mg/M2 and Keytruda 200 Mg IV every 3 weeks.  First dose September 05, 2022.  Status post 11 cycles.  Starting from cycle #5 the patient will be on maintenance treatment with Alimta and Keytruda every 3 weeks.  INTERVAL HISTORY: Samuel [name] 57 y.o. male is to the clinic today for follow-up visit accompanied by his girlfriend.  The patient is feeling fine today with no concerning complaints except for mild fatigue the last few days.  He also has some indigestion.  He denied having any chest pain, shortness of breath, cough or hemoptysis.  He has no nausea, vomiting, diarrhea or constipation.  He has no headache or visual changes.  He denied having any significant weight loss or  night sweats.  He is here today for evaluation before starting cycle #12 of his treatment.  MEDICAL HISTORY: Past Medical History:  Diagnosis Date   Alcohol use disorder, severe, dependence (HCC) 12/26/2016   Diabetes mellitus without complication (HCC)    Metastatic cancer to brain (HCC) 08/05/2022   Metastatic cancer to spine (HCC) 07/19/2022   Pancreatitis unk   Primary lung adenocarcinoma (HCC) 08/05/2022   Suicidal ideation    Type 2 diabetes mellitus with hyperglycemia, without long-term current use of insulin (HCC) 06/13/2022   Formatting of this note might be different from the original. 06/13/2022 A1C 13.8, FSBG 414 Start empagliflozin 5mg /metformin 1000 daily, levemir 20 qhs. (Samples given) Will apply for medassist for pharmacy    ALLERGIES:  is allergic to penicillins and percocet [oxycodone-acetaminophen].  MEDICATIONS:  Current Outpatient Medications  Medication Sig Dispense Refill   Accu-Chek Softclix Lancets lancets Use to check blood sugar 3 times daily. E11.69 100 each 6   atorvastatin (LIPITOR) 40 MG tablet Take 1 tablet (40 mg total) by mouth daily. 90 tablet 1   blood glucose meter kit and supplies KIT Dispense based on patient and insurance preference. Use up to four times daily as directed. 1 each 0   Blood Glucose Monitoring Suppl (ACCU-CHEK GUIDE) w/Device KIT Use to check blood sugar 3 times daily. E11.69 1 kit 0   empagliflozin (JARDIANCE) 10 MG TABS tablet Take 1 tablet (10 mg total) by mouth daily before breakfast. 60 tablet 4   folic acid (FOLVITE) 1 MG tablet Take 1 tablet (1 mg total) by mouth daily. 30 tablet 4  gabapentin (NEURONTIN) 300 MG capsule Take 2 capsules (600 mg total) by mouth 3 (three) times daily. 180 capsule 1   glucose blood (ACCU-CHEK GUIDE) test strip Use to check blood sugar 3 times daily. E11.69 100 each 6   insulin glargine (LANTUS SOLOSTAR) 100 UNIT/ML Solostar Pen Inject 40 Units into the skin daily. 15 mL 11   Insulin Pen Needle  32G X 4 MM MISC Use with Insulin pen 100 each 3   ipratropium-albuterol (DUONEB) 0.5-2.5 (3) MG/3ML SOLN Take 3 mLs by nebulization every 6 (six) hours as needed (Shortness of breath). 360 mL 0   lidocaine-prilocaine (EMLA) cream Apply to the Port-A-Cath site 30-60 minutes before chemo. 30 g 0   nicotine polacrilex (NICORETTE MINI) 4 MG lozenge Use 3 times a day as directed to quit smoking 100 tablet 4   Oxycodone HCl 10 MG TABS Take 1 tablet (10 mg) by mouth every 6 hours as needed for severe pain. 60 tablet 0   prochlorperazine (COMPAZINE) 10 MG tablet Take 1 tablet (10 mg total) by mouth every 6 (six) hours as needed for nausea or vomiting. 30 tablet 0   sildenafil (VIAGRA) 100 MG tablet Take 1/2 - 1 tablet by mouth daily as needed for erectile dysfunction. 5 tablet 11   tiZANidine (ZANAFLEX) 2 MG tablet Take 1 tablet (2 mg) by mouth at bedtime. 30 tablet 1   No current facility-administered medications for this visit.    SURGICAL HISTORY:  Past Surgical History:  Procedure Laterality Date   IR BONE TUMOR(S)RF ABLATION  08/01/2022   IR IMAGING GUIDED PORT INSERTION  09/21/2022   IR KYPHO LUMBAR INC FX REDUCE BONE BX UNI/BIL CANNULATION INC/IMAGING  08/01/2022    REVIEW OF SYSTEMS:  A comprehensive review of systems was negative except for: Constitutional: positive for fatigue   PHYSICAL EXAMINATION: General appearance: alert, cooperative, fatigued, and no distress Head: Normocephalic, without obvious abnormality, atraumatic Neck: no adenopathy, no JVD, supple, symmetrical, trachea midline, and thyroid not enlarged, symmetric, no tenderness/mass/nodules Lymph nodes: Cervical, supraclavicular, and axillary nodes normal. Resp: clear to auscultation bilaterally Back: symmetric, no curvature. ROM normal. No CVA tenderness. Cardio: regular rate and rhythm, S1, S2 normal, no murmur, click, rub or gallop GI: soft, non-tender; bowel sounds normal; no masses,  no organomegaly Extremities:  extremities normal, atraumatic, no cyanosis or edema  ECOG PERFORMANCE STATUS: 1 - Symptomatic but completely ambulatory  Blood pressure 106/73, pulse 94, temperature 97.9 F (36.6 C), temperature source Oral, resp. rate 18, height 5\' 8"  (1.727 m), weight 146 lb 9.6 oz (66.5 kg), SpO2 100 %.  LABORATORY DATA: Lab Results  Component Value Date   WBC 8.2 04/25/2023   HGB 13.9 04/25/2023   HCT 39.6 04/25/2023   MCV 97.3 04/25/2023   PLT 237 04/25/2023      Chemistry      Component Value Date/Time   NA 136 04/25/2023 1235   NA 140 12/24/2020 1144   NA 142 08/02/2014 1649   K 3.8 04/25/2023 1235   K 4.2 08/02/2014 1649   CL 102 04/25/2023 1235   CL 109 (H) 08/02/2014 1649   CO2 28 04/25/2023 1235   CO2 21 08/02/2014 1649   BUN 14 04/25/2023 1235   BUN 7 12/24/2020 1144   BUN 11 08/02/2014 1649   CREATININE 0.61 04/25/2023 1235   CREATININE 0.92 08/02/2014 1649      Component Value Date/Time   CALCIUM 10.0 04/25/2023 1235   CALCIUM 8.6 08/02/2014 1649   ALKPHOS 69  04/25/2023 1235   ALKPHOS 82 08/02/2014 1649   AST 23 04/25/2023 1235   ALT 30 04/25/2023 1235   ALT 132 (H) 08/02/2014 1649   BILITOT 0.4 04/25/2023 1235       RADIOGRAPHIC STUDIES: No results found.  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 He has molecular studies by foundation 1 that showed no actionable mutations and PD-L1 expression was 90%. He underwent palliative radiotherapy to the L2 vertebral body compression fraction as well as solitary brain metastasis with SRS. He is currently  undergoing palliative systemic chemotherapy with carboplatin for AUC of 5, Alimta 500 Mg/M2 and Keytruda 200 Mg IV every 3 weeks.  Status post 11 cycles.  Starting from cycle #5 he is on treatment with maintenance Alimta and Keytruda every 3 weeks. The patient has been tolerating this treatment well with no concerning adverse effects. I recommended for him to proceed with cycle #12 today as planned. I will see him back for follow-up visit in 3 weeks for evaluation with repeat CT scan of the chest, abdomen and pelvis for restaging of his disease. For the pain management, he is currently on MS Contin 15 mg p.o. twice daily in addition to Oxy IR for breakthrough pain and managed by the palliative care team.  The patient was advised to call immediately if he has any other concerning symptoms in the interval. The patient voices understanding of current disease status and treatment options and is in agreement with the current care plan.  All questions were answered. The patient knows to call the clinic with any problems, questions or concerns. We can certainly see the patient much sooner if necessary.  The total time spent in the appointment was 20 minutes.  Disclaimer: This note was dictated with voice recognition software. Similar sounding words can inadvertently be transcribed and may not be corrected upon review.

## 2023-04-27 LAB — T4: T4, Total: 8.5 ug/dL (ref 4.5–12.0)

## 2023-05-15 ENCOUNTER — Ambulatory Visit: Payer: Medicaid Other

## 2023-05-15 ENCOUNTER — Encounter: Payer: Self-pay | Admitting: Internal Medicine

## 2023-05-15 ENCOUNTER — Inpatient Hospital Stay: Payer: Medicaid Other

## 2023-05-15 ENCOUNTER — Other Ambulatory Visit: Payer: Self-pay | Admitting: Nurse Practitioner

## 2023-05-15 ENCOUNTER — Telehealth: Payer: Self-pay | Admitting: *Deleted

## 2023-05-15 ENCOUNTER — Other Ambulatory Visit (HOSPITAL_COMMUNITY): Payer: Self-pay

## 2023-05-15 ENCOUNTER — Other Ambulatory Visit: Payer: Medicaid Other

## 2023-05-15 ENCOUNTER — Other Ambulatory Visit: Payer: Self-pay

## 2023-05-15 ENCOUNTER — Ambulatory Visit (HOSPITAL_COMMUNITY)
Admission: RE | Admit: 2023-05-15 | Discharge: 2023-05-15 | Disposition: A | Discharge status: Home / Self Care | Payer: Medicaid Other | Source: Ambulatory Visit | Attending: Internal Medicine | Admitting: Internal Medicine

## 2023-05-15 ENCOUNTER — Inpatient Hospital Stay (HOSPITAL_BASED_OUTPATIENT_CLINIC_OR_DEPARTMENT_OTHER): Payer: Medicaid Other | Admitting: Internal Medicine

## 2023-05-15 ENCOUNTER — Encounter (HOSPITAL_COMMUNITY): Payer: Self-pay

## 2023-05-15 VITALS — BP 122/82 | HR 85 | Resp 16

## 2023-05-15 VITALS — BP 132/75 | HR 91 | Temp 97.7°F | Resp 16 | Ht 68.0 in | Wt 150.4 lb

## 2023-05-15 DIAGNOSIS — C3490 Malignant neoplasm of unspecified part of unspecified bronchus or lung: Secondary | ICD-10-CM

## 2023-05-15 DIAGNOSIS — C7951 Secondary malignant neoplasm of bone: Secondary | ICD-10-CM

## 2023-05-15 DIAGNOSIS — M792 Neuralgia and neuritis, unspecified: Secondary | ICD-10-CM

## 2023-05-15 DIAGNOSIS — C7931 Secondary malignant neoplasm of brain: Secondary | ICD-10-CM

## 2023-05-15 DIAGNOSIS — G893 Neoplasm related pain (acute) (chronic): Secondary | ICD-10-CM

## 2023-05-15 DIAGNOSIS — Z515 Encounter for palliative care: Secondary | ICD-10-CM

## 2023-05-15 LAB — CMP (CANCER CENTER ONLY)
ALT: 23 U/L (ref 0–44)
AST: 19 U/L (ref 15–41)
Albumin: 3.4 g/dL — ABNORMAL LOW (ref 3.5–5.0)
Alkaline Phosphatase: 64 U/L (ref 38–126)
Anion gap: 6 (ref 5–15)
BUN: 14 mg/dL (ref 6–20)
CO2: 27 mmol/L (ref 22–32)
Calcium: 8.9 mg/dL (ref 8.9–10.3)
Chloride: 104 mmol/L (ref 98–111)
Creatinine: 0.59 mg/dL — ABNORMAL LOW (ref 0.61–1.24)
GFR, Estimated: >60 mL/min (ref >60)
Glucose, Bld: 408 mg/dL — ABNORMAL HIGH (ref 70–99)
Potassium: 4 mmol/L (ref 3.5–5.1)
Sodium: 137 mmol/L (ref 135–145)
Total Bilirubin: 0.3 mg/dL (ref 0.3–1.2)
Total Protein: 6.2 g/dL — ABNORMAL LOW (ref 6.5–8.1)

## 2023-05-15 LAB — CBC WITH DIFFERENTIAL (CANCER CENTER ONLY)
Abs Immature Granulocytes: 0.03 10*3/uL (ref 0.00–0.07)
Basophils Absolute: 0 10*3/uL (ref 0.0–0.1)
Basophils Relative: 0 %
Eosinophils Absolute: 0.1 10*3/uL (ref 0.0–0.5)
Eosinophils Relative: 1 %
HCT: 37.5 % — ABNORMAL LOW (ref 39.0–52.0)
Hemoglobin: 12.7 g/dL — ABNORMAL LOW (ref 13.0–17.0)
Immature Granulocytes: 0 %
Lymphocytes Relative: 13 %
Lymphs Abs: 0.9 10*3/uL (ref 0.7–4.0)
MCH: 33.5 pg (ref 26.0–34.0)
MCHC: 33.9 g/dL (ref 30.0–36.0)
MCV: 98.9 fL (ref 80.0–100.0)
Monocytes Absolute: 0.5 10*3/uL (ref 0.1–1.0)
Monocytes Relative: 8 %
Neutro Abs: 5.4 10*3/uL (ref 1.7–7.7)
Neutrophils Relative %: 78 %
Platelet Count: 219 10*3/uL (ref 150–400)
RBC: 3.79 MIL/uL — ABNORMAL LOW (ref 4.22–5.81)
RDW: 12.9 % (ref 11.5–15.5)
WBC Count: 6.9 10*3/uL (ref 4.0–10.5)
nRBC: 0 % (ref 0.0–0.2)

## 2023-05-15 MED ORDER — SILDENAFIL CITRATE 100 MG PO TABS
50.0000 mg | ORAL_TABLET | Freq: Every day | ORAL | 0 refills | Status: DC | PRN
  Filled 2023-05-15: qty 15, 15d supply, fill #0

## 2023-05-15 MED ORDER — SODIUM CHLORIDE 0.9 % IV SOLN
500.0000 mg/m2 | Freq: Once | INTRAVENOUS | Status: AC
  Administered 2023-05-15: 900 mg via INTRAVENOUS
  Filled 2023-05-15: qty 20

## 2023-05-15 MED ORDER — IOHEXOL 300 MG/ML  SOLN
100.0000 mL | Freq: Once | INTRAMUSCULAR | Status: AC | PRN
  Administered 2023-05-15: 100 mL via INTRAVENOUS

## 2023-05-15 MED ORDER — HEPARIN SOD (PORK) LOCK FLUSH 100 UNIT/ML IV SOLN
500.0000 [IU] | Freq: Once | INTRAVENOUS | Status: AC
  Administered 2023-05-15: 500 [IU] via INTRAVENOUS

## 2023-05-15 MED ORDER — SODIUM CHLORIDE 0.9% FLUSH
10.0000 mL | Freq: Once | INTRAVENOUS | Status: AC
  Administered 2023-05-15: 10 mL

## 2023-05-15 MED ORDER — SODIUM CHLORIDE 0.9 % IV SOLN
200.0000 mg | Freq: Once | INTRAVENOUS | Status: AC
  Administered 2023-05-15: 200 mg via INTRAVENOUS
  Filled 2023-05-15: qty 200

## 2023-05-15 MED ORDER — OXYCODONE HCL 10 MG PO TABS
10.0000 mg | ORAL_TABLET | Freq: Four times a day (QID) | ORAL | 0 refills | Status: DC | PRN
Start: 2023-05-15 — End: 2023-06-04
  Filled 2023-05-15: qty 60, 15d supply, fill #0

## 2023-05-15 MED ORDER — INSULIN ASPART 100 UNIT/ML IJ SOLN
10.0000 [IU] | Freq: Once | INTRAMUSCULAR | Status: AC
  Administered 2023-05-15: 10 [IU] via SUBCUTANEOUS
  Filled 2023-05-15: qty 1

## 2023-05-15 MED ORDER — SODIUM CHLORIDE 0.9 % IV SOLN: Freq: Once | INTRAVENOUS | Status: AC

## 2023-05-15 MED ORDER — SODIUM CHLORIDE (PF) 0.9 % IJ SOLN
INTRAMUSCULAR | Status: AC
  Filled 2023-05-15: qty 50

## 2023-05-15 MED ORDER — PROCHLORPERAZINE MALEATE 10 MG PO TABS
10.0000 mg | ORAL_TABLET | Freq: Once | ORAL | Status: AC
  Administered 2023-05-15: 10 mg via ORAL
  Filled 2023-05-15: qty 1

## 2023-05-15 MED ORDER — HEPARIN SOD (PORK) LOCK FLUSH 100 UNIT/ML IV SOLN
INTRAVENOUS | Status: AC
  Filled 2023-05-15: qty 5

## 2023-05-15 MED ORDER — HEPARIN SOD (PORK) LOCK FLUSH 100 UNIT/ML IV SOLN
500.0000 [IU] | Freq: Once | INTRAVENOUS | Status: AC | PRN
  Administered 2023-05-15: 500 [IU]

## 2023-05-15 MED ORDER — SODIUM CHLORIDE 0.9% FLUSH
10.0000 mL | INTRAVENOUS | Status: DC | PRN
  Administered 2023-05-15: 10 mL

## 2023-05-15 NOTE — Progress Notes (Signed)
[facility] [phone]) [phone]   Fax:(336) [phone]  OFFICE PROGRESS NOTE  [name], MD 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  Biomarker Findings Microsatellite status - MS-Stable Tumor Mutational Burden - 8 Muts/Mb Genomic Findings For a complete list of the genes assayed, please refer to the Appendix. KEAP1 Q227* CDKN2A/B p16INK4a G89V PRKCI amplification TERC amplification - equivocal? TP53 D255fs*18 8 Disease relevant genes with no reportable alterations: ALK, BRAF, EGFR, ERBB2, KRAS, MET, RET, ROS1  PDL1 TPS  90%  PRIOR THERAPY:  1) Status post palliative radiotherapy to the L2 lesion under the care of Dr. Kathrynn Running. 2) Status post SRS to solitary brain metastasis under the care of Dr. Kathrynn Running  CURRENT THERAPY: Palliative systemic therapy with carboplatin for AUC of 5, Alimta 500 Mg/M2 and Keytruda 200 Mg IV every 3 weeks.  First dose September 05, 2022.  Status post 12 cycles.  Starting from cycle #5 the patient will be on maintenance treatment with Alimta and Keytruda every 3 weeks.  INTERVAL HISTORY: Samuel [name] 57 y.o. male returns to the clinic today for follow-up visit accompanied by his girlfriend.  The patient is feeling fine today with no concerning complaints.  He denied having any current chest pain, shortness of breath, cough or hemoptysis.  He has no nausea, vomiting, diarrhea or constipation.  He has been staying at the [facility] for the last 2 weeks and he is going back today after treatment.  He could not get his CT scan of the chest,  abdomen and pelvis done before the visit but he will do it later today.  He has no recent weight loss or night sweats.  He has no fever or chills.  He is here today for evaluation before starting cycle #13.   MEDICAL HISTORY: Past Medical History:  Diagnosis Date   Alcohol use disorder, severe, dependence (HCC) 12/26/2016   Diabetes mellitus without complication (HCC)    Metastatic cancer to brain (HCC) 08/05/2022   Metastatic cancer to spine (HCC) 07/19/2022   Pancreatitis unk   Primary lung adenocarcinoma (HCC) 08/05/2022   Suicidal ideation    Type 2 diabetes mellitus with hyperglycemia, without long-term current use of insulin (HCC) 06/13/2022   Formatting of this note might be different from the original. 06/13/2022 A1C 13.8, FSBG 414 Start empagliflozin 5mg /metformin 1000 daily, levemir 20 qhs. (Samples given) Will apply for medassist for pharmacy    ALLERGIES:  is allergic to penicillins and percocet [oxycodone-acetaminophen].  MEDICATIONS:  Current Outpatient Medications  Medication Sig Dispense Refill   Accu-Chek Softclix Lancets lancets Use to check blood sugar 3 times daily. E11.69 100 each 6   atorvastatin (LIPITOR) 40 MG tablet Take 1 tablet (40 mg total) by mouth daily. 90 tablet 1   blood glucose meter kit and supplies KIT Dispense based on patient and insurance preference. Use up to four times daily as directed. 1 each 0   Blood Glucose Monitoring Suppl (ACCU-CHEK GUIDE) w/Device KIT Use to check blood sugar 3 times daily. E11.69 1 kit 0   empagliflozin (JARDIANCE) 10 MG TABS tablet Take 1 tablet (10 mg total)  by mouth daily before breakfast. 60 tablet 4   folic acid (FOLVITE) 1 MG tablet Take 1 tablet (1 mg total) by mouth daily. 30 tablet 4   gabapentin (NEURONTIN) 300 MG capsule Take 2 capsules (600 mg total) by mouth 3 (three) times daily. 180 capsule 1   glucose blood (ACCU-CHEK GUIDE) test strip Use to check blood sugar 3 times daily. E11.69 100 each 6   insulin  glargine (LANTUS SOLOSTAR) 100 UNIT/ML Solostar Pen Inject 40 Units into the skin daily. 15 mL 11   Insulin Pen Needle 32G X 4 MM MISC Use with Insulin pen 100 each 3   ipratropium-albuterol (DUONEB) 0.5-2.5 (3) MG/3ML SOLN Take 3 mLs by nebulization every 6 (six) hours as needed (Shortness of breath). 360 mL 0   lidocaine-prilocaine (EMLA) cream Apply to the Port-A-Cath site 30-60 minutes before chemo. 30 g 0   nicotine polacrilex (NICORETTE MINI) 4 MG lozenge Use 3 times a day as directed to quit smoking 100 tablet 4   Oxycodone HCl 10 MG TABS Take 1 tablet (10 mg) by mouth every 6 hours as needed for severe pain. 60 tablet 0   prochlorperazine (COMPAZINE) 10 MG tablet Take 1 tablet (10 mg total) by mouth every 6 (six) hours as needed for nausea or vomiting. 30 tablet 0   sildenafil (VIAGRA) 100 MG tablet Take 1/2 - 1 tablet by mouth daily as needed for erectile dysfunction. 5 tablet 11   sildenafil (VIAGRA) 100 MG tablet Take 0.5-1 tablets (50-100 mg total) by mouth daily as needed for erectile dysfunction. 15 tablet 0   tiZANidine (ZANAFLEX) 2 MG tablet Take 1 tablet (2 mg) by mouth at bedtime. 30 tablet 1   No current facility-administered medications for this visit.    SURGICAL HISTORY:  Past Surgical History:  Procedure Laterality Date   IR BONE TUMOR(S)RF ABLATION  08/01/2022   IR IMAGING GUIDED PORT INSERTION  09/21/2022   IR KYPHO LUMBAR INC FX REDUCE BONE BX UNI/BIL CANNULATION INC/IMAGING  08/01/2022    REVIEW OF SYSTEMS:  A comprehensive review of systems was negative except for: Constitutional: positive for fatigue   PHYSICAL EXAMINATION: General appearance: alert, cooperative, fatigued, and no distress Head: Normocephalic, without obvious abnormality, atraumatic Neck: no adenopathy, no JVD, supple, symmetrical, trachea midline, and thyroid not enlarged, symmetric, no tenderness/mass/nodules Lymph nodes: Cervical, supraclavicular, and axillary nodes normal. Resp: clear to  auscultation bilaterally Back: symmetric, no curvature. ROM normal. No CVA tenderness. Cardio: regular rate and rhythm, S1, S2 normal, no murmur, click, rub or gallop GI: soft, non-tender; bowel sounds normal; no masses,  no organomegaly Extremities: extremities normal, atraumatic, no cyanosis or edema  ECOG PERFORMANCE STATUS: 1 - Symptomatic but completely ambulatory  Blood pressure 132/75, pulse 91, temperature 97.7 F (36.5 C), temperature source Temporal, resp. rate 16, height 5\' 8"  (1.727 m), weight 150 lb 6.4 oz (68.2 kg), SpO2 100 %.  LABORATORY DATA: Lab Results  Component Value Date   WBC 8.2 04/25/2023   HGB 13.9 04/25/2023   HCT 39.6 04/25/2023   MCV 97.3 04/25/2023   PLT 237 04/25/2023      Chemistry      Component Value Date/Time   NA 136 04/25/2023 1235   NA 140 12/24/2020 1144   NA 142 08/02/2014 1649   K 3.8 04/25/2023 1235   K 4.2 08/02/2014 1649   CL 102 04/25/2023 1235   CL 109 (H) 08/02/2014 1649   CO2 28 04/25/2023 1235   CO2 21 08/02/2014 1649  BUN 14 04/25/2023 1235   BUN 7 12/24/2020 1144   BUN 11 08/02/2014 1649   CREATININE 0.61 04/25/2023 1235   CREATININE 0.92 08/02/2014 1649      Component Value Date/Time   CALCIUM 10.0 04/25/2023 1235   CALCIUM 8.6 08/02/2014 1649   ALKPHOS 69 04/25/2023 1235   ALKPHOS 82 08/02/2014 1649   AST 23 04/25/2023 1235   ALT 30 04/25/2023 1235   ALT 132 (H) 08/02/2014 1649   BILITOT 0.4 04/25/2023 1235       RADIOGRAPHIC STUDIES: No results found.  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 He has molecular studies by foundation 1 that showed no actionable mutations and PD-L1 expression was 90%. He underwent palliative radiotherapy to the L2 vertebral body compression fraction as well as solitary brain metastasis with SRS. He is currently undergoing palliative systemic chemotherapy with carboplatin for AUC of 5, Alimta 500 Mg/M2 and Keytruda 200 Mg IV every 3 weeks.  Status post 12 cycles.  Starting from cycle #5 he is on treatment with maintenance Alimta and Keytruda every 3 weeks. The patient has been tolerating this treatment well with no concerning adverse effects. I recommended for him to proceed with cycle #13 today as planned.  He was supposed to have repeat CT scan of the chest, abdomen and pelvis before this visit but unfortunately it is scheduled to be done later today. I will call the patient if there is any concerning findings on the scan otherwise he will come back for follow-up visit in 3 weeks for evaluation before the next cycle of his treatment. For the pain management, he is currently on MS Contin 15 mg p.o. twice daily in addition to Oxy IR for breakthrough pain and managed by the palliative care team.  The patient was advised to call immediately if he has any other concerning symptoms in the interval. The patient voices understanding of current disease status and treatment options and is in agreement with the current care plan.  All questions were answered. The patient knows to call the clinic with any problems, questions or concerns. We can certainly see the patient much sooner if necessary.  The total time spent in the appointment was 20 minutes.  Disclaimer: This note was dictated with voice recognition software. Similar sounding words can inadvertently be transcribed and may not be corrected upon review.

## 2023-05-15 NOTE — Telephone Encounter (Signed)
Faxed dental clearance signed by Dr. Arbutus Ped to Dentist office Fax confirmation received

## 2023-05-23 ENCOUNTER — Other Ambulatory Visit (HOSPITAL_COMMUNITY): Payer: Self-pay

## 2023-05-23 ENCOUNTER — Telehealth: Payer: Self-pay | Admitting: Internal Medicine

## 2023-05-23 NOTE — Telephone Encounter (Signed)
Called patient regarding July/August appointments, patient is notified.  

## 2023-05-31 NOTE — Progress Notes (Signed)
[facility] OFFICE PROGRESS NOTE  [name], MD 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    Biomarker Findings Microsatellite status - MS-Stable Tumor Mutational Burden - 8 Muts/Mb Genomic Findings For a complete list of the genes assayed, please refer to the Appendix. KEAP1 Q227* CDKN2A/B [id number] G89V PRKCI amplification TERC amplification - equivocal? TP53 [id number] 8 Disease relevant genes with no reportable alterations: ALK, BRAF, EGFR, ERBB2, KRAS, MET, RET, ROS1   PDL1 TPS  90%   PRIOR THERAPY: 1) Status post palliative radiotherapy to the L2 lesion under the care of Dr. Kathrynn Running. 2) Status post SRS to solitary brain metastasis under the care of Dr. Kathrynn Running  CURRENT THERAPY: Palliative systemic therapy with carboplatin for AUC of 5, Alimta 500 Mg/M2 and Keytruda 200 Mg IV every 3 weeks.  First dose September 05, 2022.  Status post 13 cycles. Starting from cycle #5, he started on maintenance keytruda and alimta IV every 3 weeks.   INTERVAL HISTORY: Samuel [name] 57 y.o. male returns to the clinic today for a follow-up visit. The patient was diagnosed with lung cancer in September 2023.  He is currently undergoing palliative systemic maintenance chemotherapy and immunotherapy. The patient appreciates that he feels a lot better than when he was initially diagnosed. When he was first diagnosed, he could not walk. While he still has trouble with back pain and radiating pain down his legs, it is a lot better than it was previously. He has successfully weaned off morphine. He  takes oxycodone for break through pain which he is not requiring daily.   Overall, he feels good. He got a Photographer but has not gone yet. He just got back from [place].  Otherwise he denies any fever, chills, night sweats, or appetite changes. Denies any chest pain or shortness of breath.  He reports he sometimes has a cough but it is intermittent and not daily. It occurs more in the AM.  He denies any nausea, vomiting, diarrhea, or constipation. Denies any headache or visual changes.  He recently had a restaging CT scan. He is here for evaluation and to review his scan before starting cycle #14.   MEDICAL HISTORY: Past Medical History:  Diagnosis Date   Alcohol use disorder, severe, dependence (HCC) 12/26/2016   Diabetes mellitus without complication (HCC)    Metastatic cancer to brain (HCC) 08/05/2022   Metastatic cancer to spine (HCC) 07/19/2022   Pancreatitis unk   Primary lung adenocarcinoma (HCC) 08/05/2022   Suicidal ideation    Type 2 diabetes mellitus with hyperglycemia, without long-term current use of insulin (HCC) 06/13/2022   Formatting of this note might be different from the original. 06/13/2022 A1C 13.8, FSBG 414 Start empagliflozin 5mg /metformin 1000 daily, levemir 20 qhs. (Samples given) Will apply for medassist for pharmacy    ALLERGIES:  is allergic to penicillins and percocet [oxycodone-acetaminophen].  MEDICATIONS:  Current Outpatient Medications  Medication Sig Dispense Refill   Accu-Chek Softclix Lancets lancets Use to check blood sugar 3 times daily. E11.69 100 each 6   atorvastatin (LIPITOR) 40 MG tablet Take 1 tablet (40 mg total) by mouth daily.  90 tablet 1   blood glucose meter kit and supplies KIT Dispense based on patient and insurance preference. Use up to four times daily as directed. 1 each 0   Blood Glucose Monitoring Suppl (ACCU-CHEK GUIDE) w/Device KIT Use to check blood sugar 3 times daily. E11.69 1 kit 0   empagliflozin  (JARDIANCE) 10 MG TABS tablet Take 1 tablet (10 mg total) by mouth daily before breakfast. 60 tablet 4   folic acid (FOLVITE) 1 MG tablet Take 1 tablet (1 mg total) by mouth daily. 30 tablet 4   gabapentin (NEURONTIN) 300 MG capsule Take 2 capsules (600 mg total) by mouth 3 (three) times daily. 180 capsule 1   glucose blood (ACCU-CHEK GUIDE) test strip Use to check blood sugar 3 times daily. E11.69 100 each 6   insulin glargine (LANTUS SOLOSTAR) 100 UNIT/ML Solostar Pen Inject 40 Units into the skin daily. 15 mL 11   Insulin Pen Needle 32G X 4 MM MISC Use with Insulin pen 100 each 3   Oxycodone HCl 10 MG TABS Take 1 tablet (10 mg) by mouth every 6 hours as needed for severe pain. 60 tablet 0   sildenafil (VIAGRA) 100 MG tablet Take 1/2 - 1 tablet by mouth daily as needed for erectile dysfunction. 5 tablet 11   sildenafil (VIAGRA) 100 MG tablet Take 1/2 - 1 tablet by mouth daily as needed for erectile dysfunction. 15 tablet 0   tiZANidine (ZANAFLEX) 2 MG tablet Take 1 tablet (2 mg) by mouth at bedtime. 30 tablet 1   ipratropium-albuterol (DUONEB) 0.5-2.5 (3) MG/3ML SOLN Take 3 mLs by nebulization every 6 (six) hours as needed (Shortness of breath). (Patient not taking: Reported on 06/05/2023) 360 mL 0   lidocaine-prilocaine (EMLA) cream Apply to the Port-A-Cath site 30-60 minutes before chemo. (Patient not taking: Reported on 06/05/2023) 30 g 0   nicotine polacrilex (NICORETTE MINI) 4 MG lozenge Use 3 times a day as directed to quit smoking (Patient not taking: Reported on 06/05/2023) 100 tablet 4   prochlorperazine (COMPAZINE) 10 MG tablet Take 1 tablet (10 mg total) by mouth every 6 (six) hours as needed for nausea or vomiting. (Patient not taking: Reported on 06/05/2023) 30 tablet 0   No current facility-administered medications for this visit.   Facility-Administered Medications Ordered in Other Visits  Medication Dose Route Frequency Provider Last Rate Last Admin   0.9 %  sodium chloride infusion    Intravenous Once Si Gaul, MD       heparin lock flush 100 unit/mL  500 Units Intracatheter Once PRN Si Gaul, MD       pembrolizumab [facility]) 200 mg in sodium chloride 0.9 % 50 mL chemo infusion  200 mg Intravenous Once Si Gaul, MD       PEMEtrexed (ALIMTA) 900 mg in sodium chloride 0.9 % 100 mL chemo infusion  500 mg/m2 (Treatment Plan Ideal) Intravenous Once Si Gaul, MD       prochlorperazine (COMPAZINE) tablet 10 mg  10 mg Oral Once Si Gaul, MD       sodium chloride flush (NS) 0.9 % injection 10 mL  10 mL Intracatheter PRN Si Gaul, MD        SURGICAL HISTORY:  Past Surgical History:  Procedure Laterality Date   IR BONE TUMOR(S)RF ABLATION  08/01/2022   IR IMAGING GUIDED PORT INSERTION  09/21/2022   IR KYPHO LUMBAR INC FX REDUCE BONE BX UNI/BIL CANNULATION INC/IMAGING  08/01/2022    REVIEW OF SYSTEMS:   Constitutional:  Negative for appetite change, chills, fatigue, fever and unexpected weight change.  HENT: Negative for mouth sores, nosebleeds, sore throat and trouble swallowing.  Eyes: Negative for eye problems and icterus.  Respiratory: Positive for mild/intermittent cough. Negative for hemoptysis, shortness of breath and wheezing.   Cardiovascular: Negative for chest pain and leg swelling.  Gastrointestinal: Negative for abdominal pain, constipation, diarrhea, nausea and vomiting.  Genitourinary: Negative for bladder incontinence, difficulty urinating, dysuria, frequency and hematuria.   Musculoskeletal: Positive for improved but persistent back pain with radiation down his L leg occasionally.  Negative for gait problem, neck pain and neck stiffness.  Skin: Positive for single circular scaly skin lesion on right shoulder. Negative for itching and rash.  Neurological: Negative for dizziness, extremity weakness, gait problem, headaches, light-headedness and seizures.  Hematological: Negative for adenopathy. Does not bruise/bleed  easily.  Psychiatric/Behavioral: Negative for confusion, depression and sleep disturbance. The patient is not nervous/anxious.    PHYSICAL EXAMINATION:  Blood pressure 139/80, pulse 81, temperature 98.1 F (36.7 C), temperature source Oral, resp. rate 15, weight 150 lb 6.4 oz (68.2 kg), SpO2 100%.  ECOG PERFORMANCE STATUS: 1  Physical Exam  Constitutional: Oriented to person, place, and time and thin appearing male, and in no distress.  HENT:  Head: Normocephalic and atraumatic.  Mouth/Throat: Oropharynx is clear and moist. No oropharyngeal exudate.  Eyes: Conjunctivae are normal. Right eye exhibits no discharge. Left eye exhibits no discharge. No scleral icterus.  Neck: Normal range of motion. Neck supple.  Cardiovascular: Normal rate, regular rhythm, normal heart sounds and intact distal pulses.   Pulmonary/Chest: Effort normal and breath sounds normal. No respiratory distress. No wheezes. No rales.  Abdominal: Soft. Bowel sounds are normal. Exhibits no distension and no mass. There is no tenderness.  Musculoskeletal: Normal range of motion. Exhibits no edema.  Lymphadenopathy:    No cervical adenopathy.  Neurological: Alert and oriented to person, place, and time. Exhibits normal muscle tone. Gait normal. Coordination normal.  Skin: Skin is warm and dry. No rash noted. Not diaphoretic. No erythema. No pallor.  Psychiatric: Mood, memory and judgment normal.  Vitals reviewed.  LABORATORY DATA: Lab Results  Component Value Date   WBC 7.0 06/05/2023   HGB 12.5 (L) 06/05/2023   HCT 36.2 (L) 06/05/2023   MCV 97.6 06/05/2023   PLT 239 06/05/2023      Chemistry      Component Value Date/Time   NA 137 06/05/2023 1009   NA 140 12/24/2020 1144   NA 142 08/02/2014 1649   K 3.5 06/05/2023 1009   K 4.2 08/02/2014 1649   CL 106 06/05/2023 1009   CL 109 (H) 08/02/2014 1649   CO2 27 06/05/2023 1009   CO2 21 08/02/2014 1649   BUN 11 06/05/2023 1009   BUN 7 12/24/2020 1144   BUN  11 08/02/2014 1649   CREATININE 0.53 (L) 06/05/2023 1009   CREATININE 0.92 08/02/2014 1649      Component Value Date/Time   CALCIUM 9.2 06/05/2023 1009   CALCIUM 8.6 08/02/2014 1649   ALKPHOS 66 06/05/2023 1009   ALKPHOS 82 08/02/2014 1649   AST 21 06/05/2023 1009   ALT 24 06/05/2023 1009   ALT 132 (H) 08/02/2014 1649   BILITOT 0.3 06/05/2023 1009       RADIOGRAPHIC STUDIES:  CT Chest W Contrast  Result Date: 05/18/2023 CLINICAL DATA:  57 year old male with history of non-small cell lung cancer with metastatic disease to the brain and spine undergoing ongoing chemotherapy and radiation  therapy. Follow-up study. * Tracking Code: BO * EXAM: CT CHEST, ABDOMEN, AND PELVIS WITH CONTRAST TECHNIQUE: Multidetector CT imaging of the chest, abdomen and pelvis was performed following the standard protocol during bolus administration of intravenous contrast. RADIATION DOSE REDUCTION: This exam was performed according to the departmental dose-optimization program which includes automated exposure control, adjustment of the mA and/or kV according to patient size and/or use of iterative reconstruction technique. CONTRAST:  OMNIPAQUE IOHEXOL 300 MG/ML  SOLN COMPARISON:  CT of the chest, abdomen and pelvis 03/10/2023. FINDINGS: CT CHEST FINDINGS Cardiovascular: Heart size is normal. There is no significant pericardial fluid, thickening or pericardial calcification. There is aortic atherosclerosis, as well as atherosclerosis of the great vessels of the mediastinum and the coronary arteries, including calcified atherosclerotic plaque in the left main coronary artery. Right internal jugular single-lumen Port-A-Cath with tip terminating in the right atrium. Mediastinum/Nodes: No pathologically enlarged mediastinal or hilar lymph nodes. Esophagus is unremarkable in appearance. No axillary lymphadenopathy. Small amount of retained secretions in the proximal trachea incidentally noted. Lungs/Pleura: No suspicious  appearing pulmonary nodules or masses are noted. No acute consolidative airspace disease. No pleural effusions. Mild diffuse bronchial wall thickening with mild centrilobular and paraseptal emphysema. Musculoskeletal: Mixed lytic and sclerotic lesion in the T3 vertebral body appears similar to the prior examination. No other new aggressive appearing lytic or blastic lesions are noted elsewhere in the visualized portions of the axial and appendicular skeleton. Multiple old healed bilateral rib fractures are incidentally noted. CT ABDOMEN PELVIS FINDINGS Hepatobiliary: Subcentimeter low-attenuation lesion in segment 6 of the liver, too small to characterize, but similar to prior studies and statistically likely a tiny cyst. No other suspicious appearing hepatic lesions are noted. No intra or extrahepatic biliary ductal dilatation. Gallbladder is unremarkable in appearance. Pancreas: No pancreatic mass. No pancreatic ductal dilatation. No pancreatic or peripancreatic fluid collections or inflammatory changes. Spleen: Unremarkable. Adrenals/Urinary Tract: Tiny nonobstructive calculi are noted within the right renal collecting system measuring 1-2 mm in size. Bilateral kidneys are otherwise normal in appearance. No hydroureteronephrosis. Urinary bladder is nearly completely decompressed, but otherwise unremarkable in appearance. Stomach/Bowel: The appearance of the stomach is normal. No pathologic dilatation of small bowel or colon. Normal appendix. Vascular/Lymphatic: Extensive aortic atherosclerosis, without evidence of aneurysm or dissection in the abdominal or pelvic vasculature. No definite lymphadenopathy noted in the abdomen or pelvis. Reproductive: Prostate gland and seminal vesicles are unremarkable in appearance. Other: No significant volume of ascites.  No pneumoperitoneum. Musculoskeletal: Multiple mixed lytic and sclerotic lesions are again noted in the lumbar spine, largest of which is in the right side of  the L3 vertebral body measuring up to 3.4 x 2.6 cm, similar to the prior study. Sclerosis and chronic compression fracture of L2 with post vertebroplasty changes, similar to the prior examination, presumably a treated pathologic compression fracture. No definite new osseous lesions are noted elsewhere in the visualized portions of the axial or appendicular skeleton. IMPRESSION: 1. Osseous metastatic disease throughout the thoracolumbar spine, unchanged compared to prior examinations. 2. No new sites of extraosseous metastatic disease identified elsewhere in the chest, abdomen or pelvis. 3. Several tiny 1-2 mm nonobstructive calculi in the right kidney. 4. Aortic atherosclerosis, in addition to left main coronary artery disease. Please note that although the presence of coronary artery calcium documents the presence of coronary artery disease, the severity of this disease and any potential stenosis cannot be assessed on this non-gated CT examination. Assessment for potential risk factor modification, dietary therapy or  pharmacologic therapy may be warranted, if clinically indicated. 5. Mild diffuse bronchial wall thickening with mild centrilobular and paraseptal emphysema; imaging findings suggestive of underlying COPD. Electronically Signed   By: Trudie Reed M.D.   On: 05/18/2023 10:16   CT Abdomen Pelvis W Contrast  Result Date: 05/18/2023 CLINICAL DATA:  57 year old male with history of non-small cell lung cancer with metastatic disease to the brain and spine undergoing ongoing chemotherapy and radiation therapy. Follow-up study. * Tracking Code: BO * EXAM: CT CHEST, ABDOMEN, AND PELVIS WITH CONTRAST TECHNIQUE: Multidetector CT imaging of the chest, abdomen and pelvis was performed following the standard protocol during bolus administration of intravenous contrast. RADIATION DOSE REDUCTION: This exam was performed according to the departmental dose-optimization program which includes automated exposure  control, adjustment of the mA and/or kV according to patient size and/or use of iterative reconstruction technique. CONTRAST:  OMNIPAQUE IOHEXOL 300 MG/ML  SOLN COMPARISON:  CT of the chest, abdomen and pelvis 03/10/2023. FINDINGS: CT CHEST FINDINGS Cardiovascular: Heart size is normal. There is no significant pericardial fluid, thickening or pericardial calcification. There is aortic atherosclerosis, as well as atherosclerosis of the great vessels of the mediastinum and the coronary arteries, including calcified atherosclerotic plaque in the left main coronary artery. Right internal jugular single-lumen Port-A-Cath with tip terminating in the right atrium. Mediastinum/Nodes: No pathologically enlarged mediastinal or hilar lymph nodes. Esophagus is unremarkable in appearance. No axillary lymphadenopathy. Small amount of retained secretions in the proximal trachea incidentally noted. Lungs/Pleura: No suspicious appearing pulmonary nodules or masses are noted. No acute consolidative airspace disease. No pleural effusions. Mild diffuse bronchial wall thickening with mild centrilobular and paraseptal emphysema. Musculoskeletal: Mixed lytic and sclerotic lesion in the T3 vertebral body appears similar to the prior examination. No other new aggressive appearing lytic or blastic lesions are noted elsewhere in the visualized portions of the axial and appendicular skeleton. Multiple old healed bilateral rib fractures are incidentally noted. CT ABDOMEN PELVIS FINDINGS Hepatobiliary: Subcentimeter low-attenuation lesion in segment 6 of the liver, too small to characterize, but similar to prior studies and statistically likely a tiny cyst. No other suspicious appearing hepatic lesions are noted. No intra or extrahepatic biliary ductal dilatation. Gallbladder is unremarkable in appearance. Pancreas: No pancreatic mass. No pancreatic ductal dilatation. No pancreatic or peripancreatic fluid collections or inflammatory  changes. Spleen: Unremarkable. Adrenals/Urinary Tract: Tiny nonobstructive calculi are noted within the right renal collecting system measuring 1-2 mm in size. Bilateral kidneys are otherwise normal in appearance. No hydroureteronephrosis. Urinary bladder is nearly completely decompressed, but otherwise unremarkable in appearance. Stomach/Bowel: The appearance of the stomach is normal. No pathologic dilatation of small bowel or colon. Normal appendix. Vascular/Lymphatic: Extensive aortic atherosclerosis, without evidence of aneurysm or dissection in the abdominal or pelvic vasculature. No definite lymphadenopathy noted in the abdomen or pelvis. Reproductive: Prostate gland and seminal vesicles are unremarkable in appearance. Other: No significant volume of ascites.  No pneumoperitoneum. Musculoskeletal: Multiple mixed lytic and sclerotic lesions are again noted in the lumbar spine, largest of which is in the right side of the L3 vertebral body measuring up to 3.4 x 2.6 cm, similar to the prior study. Sclerosis and chronic compression fracture of L2 with post vertebroplasty changes, similar to the prior examination, presumably a treated pathologic compression fracture. No definite new osseous lesions are noted elsewhere in the visualized portions of the axial or appendicular skeleton. IMPRESSION: 1. Osseous metastatic disease throughout the thoracolumbar spine, unchanged compared to prior examinations. 2. No new sites of  extraosseous metastatic disease identified elsewhere in the chest, abdomen or pelvis. 3. Several tiny 1-2 mm nonobstructive calculi in the right kidney. 4. Aortic atherosclerosis, in addition to left main coronary artery disease. Please note that although the presence of coronary artery calcium documents the presence of coronary artery disease, the severity of this disease and any potential stenosis cannot be assessed on this non-gated CT examination. Assessment for potential risk factor  modification, dietary therapy or pharmacologic therapy may be warranted, if clinically indicated. 5. Mild diffuse bronchial wall thickening with mild centrilobular and paraseptal emphysema; imaging findings suggestive of underlying COPD. Electronically Signed   By: Trudie Reed M.D.   On: 05/18/2023 10:16     ASSESSMENT/PLAN:  This is a very pleasant 57 year old African-American male diagnosed with stage IV (T3, N2, M1 C) non-small cell lung cancer, adenocarcinoma.  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  He underwent biopsy and kyphoplasty to the L2 and to the brain metastasis.  He was diagnosed in September 2023.   His molecular studies showed no actionable mutations and his PD-L1 expression is 90%.  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    He underwent palliative radiation to L2 compression fracture as well as a solitary brain metastasis with SRS.    He is currently being treated with palliative systemic chemotherapy with carboplatin for an AUC of 5, Alimta 500 mg per metered square, Keytruda 200 mg IV every 3 weeks.  He status post 13 cycles and he tolerated well without any concerning adverse side effects.  Starting from cycle #5 he started maintenance Alimta and Keytruda.  IV every 3 weeks  Radiation oncology is monitoring his brain MRI and lumbar spine closely every few months. This is scheduled on 06/09/23.   The patient was seen with Dr. Arbutus Ped today.  Dr. Arbutus Ped personally and independently reviewed the scan and discussed results with the patient today.  The scan showed no evidence of disease progression.  Dr. Arbutus Ped recommends her continue on the same treatment.    Labs were reviewed.  Recommend he  proceed with cycle #14 today as scheduled.   We will see him back for follow-up visit in 3 weeks for evaluation repeat blood work before undergoing cycle #12.   He will continue to follow with palliative care. The patient is appreciative that his quality of life has improved since starting treatment.    He sometimes has a mild cough. I sent a rx for tessalon if he does not have relief from the OTC medications such as delsym. This cough is not constant. No signs of infection on CT scan.   The patient was advised to call immediately if she has any concerning symptoms in the interval. The patient voices understanding of current disease status and treatment options and is in agreement with the current care plan. All questions were answered. The patient knows to call the clinic with any problems, questions or concerns. We can certainly see the patient much sooner if necessary   No orders of the defined types were placed in this encounter.   Dmiyah Liscano L Ruey Storer, PA-C 06/05/23  ADDENDUM: Hematology/Oncology Attending: I had a face to face encounter with the patient today.  I reviewed his record, lab, scan and recommended his  care plan.  This is a very pleasant 57 years old African-American male with stage IV non-small cell lung cancer, adenocarcinoma diagnosed in September 2023 with extensive bone and brain metastasis status post biopsy and kyphoplasty of the L2.  He has PD-L1 expression of 90% but no actionable mutations.  He is status post palliative radiotherapy to the L2 lesion in addition to [facility] to solitary brain metastasis.  The patient started palliative systemic chemotherapy initially with carboplatin, Alimta and Keytruda for 4 cycles and he has been on maintenance treatment with Alimta and Keytruda for 9 more cycles.  He has been tolerating this treatment well except for fatigue.  He came from vacation [facility]. He had repeat CT scan of the chest, abdomen and pelvis performed  recently.  I personally and independently reviewed the scans and discussed the result with the patient today. His scan showed no concerning findings for disease progression. I recommended for the patient to continue his maintenance treatment with Alimta and Keytruda and he will proceed with cycle #14 today. The patient was advised to call immediately if he has any other concerning symptoms in the interval. The total time spent in the appointment was 30 minutes. Disclaimer: This note was dictated with voice recognition software. Similar sounding words can inadvertently be transcribed and may be missed upon review. Lajuana Matte, MD 06/05/23

## 2023-06-02 NOTE — Progress Notes (Signed)
Palliative Medicine [facility]  [phone] [phone]) [phone]   Name: Samuel [name] Date: 06/02/2023 MRN: [id number]  DOB: 1966/10/18  Patient Care Team: [name], MD as PCP - General (Pulmonary Disease) Rema Fendt, NP as Nurse Practitioner (Nurse Practitioner) Valerie Roys, Arty Baumgartner, NP as Nurse Practitioner (Nurse Practitioner)    INTERVAL HISTORY: Samuel [name] is a 57 y.o. 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  Palliative ask to see for symptom management and goals of care.   SOCIAL HISTORY:     reports that he has been smoking cigarettes. He has never used smokeless tobacco. He reports current alcohol use of about 50.0 standard drinks of alcohol per week. He reports that he does not use drugs.  ADVANCE DIRECTIVES:  Patient does not have an advanced directives. Education and packet provided. He has expressed interest in completing. States his sister, Avary Pitsenbarger would be his designated Clinical research associate.   CODE STATUS: Full code  PAST MEDICAL HISTORY: Past Medical History:  Diagnosis Date   Alcohol use disorder, severe, dependence (HCC) 12/26/2016   Diabetes mellitus without complication (HCC)    Metastatic cancer to brain (HCC) 08/05/2022   Metastatic cancer to spine (HCC) 07/19/2022   Pancreatitis unk   Primary lung adenocarcinoma (HCC) 08/05/2022   Suicidal ideation    Type 2 diabetes mellitus with hyperglycemia, without long-term current use of insulin (HCC) 06/13/2022   Formatting of this note might be different from the original. 06/13/2022 A1C 13.8, FSBG 414 Start empagliflozin 5mg /metformin 1000 daily, levemir 20 qhs. (Samples given) Will apply for medassist for pharmacy    ALLERGIES:  is allergic to penicillins and percocet [oxycodone-acetaminophen].  MEDICATIONS:   Current Outpatient Medications  Medication Sig Dispense Refill   Accu-Chek Softclix Lancets lancets Use to check blood sugar 3 times daily. E11.69 100 each 6   atorvastatin (LIPITOR) 40 MG tablet Take 1 tablet (40 mg total) by mouth daily. 90 tablet 1   blood glucose meter kit and supplies KIT Dispense based on patient and insurance preference. Use up to four times daily as directed. 1 each 0   Blood Glucose Monitoring Suppl (ACCU-CHEK GUIDE) w/Device KIT Use to check blood sugar 3 times daily. E11.69 1 kit 0   empagliflozin (JARDIANCE) 10 MG TABS tablet Take 1 tablet (10 mg total) by mouth daily before breakfast. 60 tablet 4   folic acid (FOLVITE) 1 MG tablet Take 1 tablet (1 mg total) by mouth daily. 30 tablet 4   gabapentin (NEURONTIN) 300 MG capsule Take 2 capsules (600 mg total) by mouth 3 (three) times daily. 180 capsule 1   glucose blood (ACCU-CHEK GUIDE) test strip Use to check blood sugar 3 times daily. E11.69 100 each 6   insulin glargine (LANTUS SOLOSTAR) 100 UNIT/ML Solostar Pen Inject 40 Units into the skin daily. 15 mL 11   Insulin Pen Needle 32G X 4 MM MISC Use with Insulin pen 100 each 3   ipratropium-albuterol (DUONEB) 0.5-2.5 (3) MG/3ML SOLN Take 3 mLs by nebulization every 6 (six) hours as needed (Shortness of breath). 360 mL 0   lidocaine-prilocaine (EMLA) cream Apply to the Port-A-Cath site 30-60 minutes before chemo. 30 g 0   nicotine polacrilex (NICORETTE MINI) 4 MG lozenge Use 3 times a day as directed to quit smoking 100 tablet 4   Oxycodone HCl 10 MG  TABS Take 1 tablet (10 mg) by mouth every 6 hours as needed for severe pain. 60 tablet 0   prochlorperazine (COMPAZINE) 10 MG tablet Take 1 tablet (10 mg total) by mouth every 6 (six) hours as needed for nausea or vomiting. 30 tablet 0   sildenafil (VIAGRA) 100 MG tablet Take 1/2 - 1 tablet by mouth daily as needed for erectile dysfunction. 5 tablet 11   sildenafil (VIAGRA) 100 MG tablet Take 1/2 - 1 tablet by mouth daily as  needed for erectile dysfunction. 15 tablet 0   tiZANidine (ZANAFLEX) 2 MG tablet Take 1 tablet (2 mg) by mouth at bedtime. 30 tablet 1   No current facility-administered medications for this visit.    VITAL SIGNS: There were no vitals taken for this visit. There were no vitals filed for this visit.  Estimated body mass index is 22.87 kg/m as calculated from the following:   Height as of 05/15/23: 5\' 8"  (1.727 m).   Weight as of 05/15/23: 150 lb 6.4 oz (68.2 kg).   PERFORMANCE STATUS (ECOG) : 1 - Symptomatic but completely ambulatory   Physical Exam General: NAD Cardiovascular: regular rate and rhythm Pulmonary:normal breathing pattern  Extremities: no edema, no joint deformities, brace in place  Skin: no rashes Neurological: AAO x4  IMPRESSION: I saw Mr. Alpern during his infusion. No acute distress. Doing well overall. Trying to remain active. Denies nausea, vomiting, constipation, or diarrhea.   Neoplasm related pain Moustapha reports pain is well controlled on current regimen. MS Contin has been successfully discontinued.   Oxycodone 10 mg  as needed. He is not requiring daily. Gabapentin as prescribed. Neuropathic pain improving. Taking all medications as prescribed. Discussed use of Tylenol for mild aches and pain more arthritic focused as well as remaining active. He acknowledges pain increases when he is less mobile for long periods of time.   We will continue to closely monitor.   Pain contract on file.    2.  Appetite Continues to improve.  Weight is stable at 146lbs.  Expresses appreciation of being able to eat foods that he wants enjoyed.  PLAN: MS Contin discontinued.  Patient has successfully weaned appropriately. Oxy IR 10 mg every 6 hours as needed for breakthrough pain. Not requiring daily.  Colace twice daily Gabapentin 600 mg three times daily  Appetite much improved.Weight is stable. Pain contract signed on 10/23. I will plan to see patient back in 3-4  weeks in collaboration with his other oncology appointments.   Patient expressed understanding and was in agreement with this plan. He also understands that He can call the clinic at any time with any questions, concerns, or complaints.       Any controlled substances utilized were prescribed in the context of palliative care. PDMP has been reviewed.    Visit consisted of counseling and education dealing with the complex and emotionally intense issues of symptom management and palliative care in the setting of serious and potentially life-threatening illness.Greater than 50%  of this time was spent counseling and coordinating care related to the above assessment and plan.  Willette Alma, AGPCNP-BC  Palliative Medicine Team/[facility]  *Please note that this is a verbal dictation therefore any spelling or grammatical errors are due to the "Dragon [employer] One" system interpretation.

## 2023-06-04 ENCOUNTER — Other Ambulatory Visit: Payer: Self-pay | Admitting: Nurse Practitioner

## 2023-06-04 DIAGNOSIS — G893 Neoplasm related pain (acute) (chronic): Secondary | ICD-10-CM

## 2023-06-04 DIAGNOSIS — C7951 Secondary malignant neoplasm of bone: Secondary | ICD-10-CM

## 2023-06-04 DIAGNOSIS — C3490 Malignant neoplasm of unspecified part of unspecified bronchus or lung: Secondary | ICD-10-CM

## 2023-06-04 DIAGNOSIS — Z515 Encounter for palliative care: Secondary | ICD-10-CM

## 2023-06-04 DIAGNOSIS — M792 Neuralgia and neuritis, unspecified: Secondary | ICD-10-CM

## 2023-06-05 ENCOUNTER — Inpatient Hospital Stay (HOSPITAL_BASED_OUTPATIENT_CLINIC_OR_DEPARTMENT_OTHER): Payer: Medicaid Other | Admitting: Nurse Practitioner

## 2023-06-05 ENCOUNTER — Other Ambulatory Visit: Payer: Self-pay

## 2023-06-05 ENCOUNTER — Encounter: Payer: Self-pay | Admitting: Internal Medicine

## 2023-06-05 ENCOUNTER — Encounter: Payer: Self-pay | Admitting: Nurse Practitioner

## 2023-06-05 ENCOUNTER — Other Ambulatory Visit (HOSPITAL_COMMUNITY): Payer: Self-pay

## 2023-06-05 ENCOUNTER — Inpatient Hospital Stay: Payer: Medicaid Other | Attending: Adult Health

## 2023-06-05 ENCOUNTER — Other Ambulatory Visit: Payer: Medicaid Other

## 2023-06-05 ENCOUNTER — Inpatient Hospital Stay: Payer: Medicaid Other

## 2023-06-05 ENCOUNTER — Inpatient Hospital Stay (HOSPITAL_BASED_OUTPATIENT_CLINIC_OR_DEPARTMENT_OTHER): Payer: Medicaid Other | Admitting: Physician Assistant

## 2023-06-05 VITALS — BP 139/80 | HR 81 | Temp 98.1°F | Resp 15 | Wt 150.4 lb

## 2023-06-05 VITALS — BP 123/73 | HR 84 | Resp 16

## 2023-06-05 DIAGNOSIS — C7951 Secondary malignant neoplasm of bone: Secondary | ICD-10-CM | POA: Insufficient documentation

## 2023-06-05 DIAGNOSIS — Z5112 Encounter for antineoplastic immunotherapy: Secondary | ICD-10-CM

## 2023-06-05 DIAGNOSIS — M792 Neuralgia and neuritis, unspecified: Secondary | ICD-10-CM

## 2023-06-05 DIAGNOSIS — Z923 Personal history of irradiation: Secondary | ICD-10-CM | POA: Diagnosis not present

## 2023-06-05 DIAGNOSIS — C3491 Malignant neoplasm of unspecified part of right bronchus or lung: Secondary | ICD-10-CM | POA: Diagnosis not present

## 2023-06-05 DIAGNOSIS — F17210 Nicotine dependence, cigarettes, uncomplicated: Secondary | ICD-10-CM | POA: Insufficient documentation

## 2023-06-05 DIAGNOSIS — C7931 Secondary malignant neoplasm of brain: Secondary | ICD-10-CM | POA: Insufficient documentation

## 2023-06-05 DIAGNOSIS — Z5111 Encounter for antineoplastic chemotherapy: Secondary | ICD-10-CM | POA: Insufficient documentation

## 2023-06-05 DIAGNOSIS — G893 Neoplasm related pain (acute) (chronic): Secondary | ICD-10-CM | POA: Diagnosis not present

## 2023-06-05 DIAGNOSIS — C3490 Malignant neoplasm of unspecified part of unspecified bronchus or lung: Secondary | ICD-10-CM

## 2023-06-05 DIAGNOSIS — Z515 Encounter for palliative care: Secondary | ICD-10-CM

## 2023-06-05 LAB — CBC WITH DIFFERENTIAL (CANCER CENTER ONLY)
Abs Immature Granulocytes: 0.02 10*3/uL (ref 0.00–0.07)
Basophils Absolute: 0 10*3/uL (ref 0.0–0.1)
Basophils Relative: 1 %
Eosinophils Absolute: 0 10*3/uL (ref 0.0–0.5)
Eosinophils Relative: 0 %
HCT: 36.2 % — ABNORMAL LOW (ref 39.0–52.0)
Hemoglobin: 12.5 g/dL — ABNORMAL LOW (ref 13.0–17.0)
Immature Granulocytes: 0 %
Lymphocytes Relative: 16 %
Lymphs Abs: 1.1 10*3/uL (ref 0.7–4.0)
MCH: 33.7 pg (ref 26.0–34.0)
MCHC: 34.5 g/dL (ref 30.0–36.0)
MCV: 97.6 fL (ref 80.0–100.0)
Monocytes Absolute: 0.6 10*3/uL (ref 0.1–1.0)
Monocytes Relative: 9 %
Neutro Abs: 5.2 10*3/uL (ref 1.7–7.7)
Neutrophils Relative %: 74 %
Platelet Count: 239 10*3/uL (ref 150–400)
RBC: 3.71 MIL/uL — ABNORMAL LOW (ref 4.22–5.81)
RDW: 12.5 % (ref 11.5–15.5)
WBC Count: 7 10*3/uL (ref 4.0–10.5)
nRBC: 0 % (ref 0.0–0.2)

## 2023-06-05 LAB — CMP (CANCER CENTER ONLY)
ALT: 24 U/L (ref 0–44)
AST: 21 U/L (ref 15–41)
Albumin: 3.6 g/dL (ref 3.5–5.0)
Alkaline Phosphatase: 66 U/L (ref 38–126)
Anion gap: 4 — ABNORMAL LOW (ref 5–15)
BUN: 11 mg/dL (ref 6–20)
CO2: 27 mmol/L (ref 22–32)
Calcium: 9.2 mg/dL (ref 8.9–10.3)
Chloride: 106 mmol/L (ref 98–111)
Creatinine: 0.53 mg/dL — ABNORMAL LOW (ref 0.61–1.24)
GFR, Estimated: >60 mL/min (ref >60)
Glucose, Bld: 118 mg/dL — ABNORMAL HIGH (ref 70–99)
Potassium: 3.5 mmol/L (ref 3.5–5.1)
Sodium: 137 mmol/L (ref 135–145)
Total Bilirubin: 0.3 mg/dL (ref 0.3–1.2)
Total Protein: 6.9 g/dL (ref 6.5–8.1)

## 2023-06-05 MED ORDER — HEPARIN SOD (PORK) LOCK FLUSH 100 UNIT/ML IV SOLN
500.0000 [IU] | Freq: Once | INTRAVENOUS | Status: AC | PRN
  Administered 2023-06-05: 500 [IU]

## 2023-06-05 MED ORDER — PROCHLORPERAZINE MALEATE 10 MG PO TABS
10.0000 mg | ORAL_TABLET | Freq: Once | ORAL | Status: AC
  Administered 2023-06-05: 10 mg via ORAL
  Filled 2023-06-05: qty 1

## 2023-06-05 MED ORDER — OXYCODONE HCL 10 MG PO TABS
10.0000 mg | ORAL_TABLET | Freq: Four times a day (QID) | ORAL | 0 refills | Status: DC | PRN
Start: 2023-06-05 — End: 2023-06-26
  Filled 2023-06-05: qty 60, 15d supply, fill #0

## 2023-06-05 MED ORDER — SODIUM CHLORIDE 0.9 % IV SOLN: Freq: Once | INTRAVENOUS | Status: AC

## 2023-06-05 MED ORDER — BENZONATATE 100 MG PO CAPS
100.0000 mg | ORAL_CAPSULE | Freq: Two times a day (BID) | ORAL | 2 refills | Status: DC | PRN
Start: 2023-06-05 — End: 2023-08-29
  Filled 2023-06-05: qty 30, 15d supply, fill #0

## 2023-06-05 MED ORDER — SODIUM CHLORIDE 0.9 % IV SOLN
500.0000 mg/m2 | Freq: Once | INTRAVENOUS | Status: AC
  Administered 2023-06-05: 900 mg via INTRAVENOUS
  Filled 2023-06-05: qty 20

## 2023-06-05 MED ORDER — SODIUM CHLORIDE 0.9 % IV SOLN
200.0000 mg | Freq: Once | INTRAVENOUS | Status: AC
  Administered 2023-06-05: 200 mg via INTRAVENOUS
  Filled 2023-06-05: qty 200

## 2023-06-05 MED ORDER — SODIUM CHLORIDE 0.9% FLUSH
10.0000 mL | Freq: Once | INTRAVENOUS | Status: AC
  Administered 2023-06-05: 10 mL

## 2023-06-05 MED ORDER — SODIUM CHLORIDE 0.9% FLUSH
10.0000 mL | INTRAVENOUS | Status: DC | PRN
  Administered 2023-06-05: 10 mL

## 2023-06-05 MED ORDER — SILDENAFIL CITRATE 100 MG PO TABS
50.0000 mg | ORAL_TABLET | Freq: Every day | ORAL | 0 refills | Status: DC | PRN
  Filled 2023-06-05: qty 15, 15d supply, fill #0

## 2023-06-05 NOTE — Patient Instructions (Addendum)
[facility]  Discharge Instructions: Thank you for choosing [facility] to provide your oncology and hematology care.   If you have a lab appointment with the Cancer Center, please go directly to the Cancer Center and check in at the registration area.   Wear comfortable clothing and clothing appropriate for easy access to any Portacath or PICC line.   We strive to give you quality time with your provider. You may need to reschedule your appointment if you arrive late (15 or more minutes).  Arriving late affects you and other patients whose appointments are after yours.  Also, if you miss three or more appointments without notifying the office, you may be dismissed from the clinic at the provider's discretion.      For prescription refill requests, have your pharmacy contact our office and allow 72 hours for refills to be completed.    Today you received the following chemotherapy and/or immunotherapy agents: Keytruda/Alimta      To help prevent nausea and vomiting after your treatment, we encourage you to take your nausea medication as directed.  BELOW ARE SYMPTOMS THAT SHOULD BE REPORTED IMMEDIATELY: *FEVER GREATER THAN 100.4 F (38 C) OR HIGHER *CHILLS OR SWEATING *NAUSEA AND VOMITING THAT IS NOT CONTROLLED WITH YOUR NAUSEA MEDICATION *UNUSUAL SHORTNESS OF BREATH *UNUSUAL BRUISING OR BLEEDING *URINARY PROBLEMS (pain or burning when urinating, or frequent urination) *BOWEL PROBLEMS (unusual diarrhea, constipation, pain near the anus) TENDERNESS IN MOUTH AND THROAT WITH OR WITHOUT PRESENCE OF ULCERS (sore throat, sores in mouth, or a toothache) UNUSUAL RASH, SWELLING OR PAIN  UNUSUAL VAGINAL DISCHARGE OR ITCHING   Items with * indicate a potential emergency and should be followed up as soon as possible or go to the Emergency Department if any problems should occur.  Please show the CHEMOTHERAPY ALERT CARD or IMMUNOTHERAPY ALERT CARD at  check-in to the Emergency Department and triage nurse.  Should you have questions after your visit or need to cancel or reschedule your appointment, please contact [facility]  Dept: [phone]  and follow the prompts.  Office hours are 8:00 a.m. to 4:30 p.m. Monday - Friday. Please note that voicemails left after 4:00 p.m. may not be returned until the following business day.  We are closed weekends and major holidays. You have access to a nurse at all times for urgent questions. Please call the main number to the clinic Dept: [phone] and follow the prompts.   For any non-urgent questions, you may also contact your provider using MyChart. We now offer e-Visits for anyone 18 and older to request care online for non-urgent symptoms. For details visit [email or web address].   Also download the MyChart app! Go to the app store, search "MyChart", open the app, select , and log in with your MyChart username and password.   

## 2023-06-09 ENCOUNTER — Ambulatory Visit (HOSPITAL_COMMUNITY)
Admission: RE | Admit: 2023-06-09 | Discharge: 2023-06-09 | Disposition: A | Discharge status: Home / Self Care | Payer: Medicaid Other | Source: Ambulatory Visit | Attending: Radiation Oncology | Admitting: Radiation Oncology

## 2023-06-09 DIAGNOSIS — C7931 Secondary malignant neoplasm of brain: Secondary | ICD-10-CM | POA: Diagnosis not present

## 2023-06-09 DIAGNOSIS — C7949 Secondary malignant neoplasm of other parts of nervous system: Secondary | ICD-10-CM | POA: Insufficient documentation

## 2023-06-09 MED ORDER — GADOBUTROL 1 MMOL/ML IV SOLN
6.8000 mL | Freq: Once | INTRAVENOUS | Status: AC | PRN
  Administered 2023-06-09: 6.8 mL via INTRAVENOUS

## 2023-06-12 ENCOUNTER — Inpatient Hospital Stay: Payer: Medicaid Other

## 2023-06-14 ENCOUNTER — Ambulatory Visit
Admission: RE | Admit: 2023-06-14 | Discharge: 2023-06-14 | Disposition: A | Discharge status: Home / Self Care | Payer: Medicaid Other | Source: Ambulatory Visit | Attending: Urology | Admitting: Urology

## 2023-06-14 ENCOUNTER — Encounter: Payer: Self-pay | Admitting: Urology

## 2023-06-14 DIAGNOSIS — C7951 Secondary malignant neoplasm of bone: Secondary | ICD-10-CM

## 2023-06-14 DIAGNOSIS — C7931 Secondary malignant neoplasm of brain: Secondary | ICD-10-CM

## 2023-06-14 NOTE — Progress Notes (Signed)
Telephone nursing appointment for patient to review most recent scan results from 06/09/2023. I verified patient's identity x2 and began nursing interview.   Patient reports dorsalgia 5/10. No other issues conveyed at this time.   Meaningful use complete.   Patient aware of their 11:30am-06/14/2023 telephone appointment w/ Ashlyn Bruning PA-C. I left my extension [phone] in case patient needs anything. Patient verbalized understanding. This concludes the nursing interview.   Patient contact [phone]     [name], LPN

## 2023-06-14 NOTE — Progress Notes (Signed)
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  Interval Since Last Radiation:  9 months  08/17/2022 through 08/30/2022 Site Technique Total Dose (Gy) Dose per Fx (Gy) Completed Fx Beam Energies  Lumbar Spine: Spine 3D 30/30 3 10/10 10X, 15X  Brain: Brain_SRS IMRT 20/20 20 1/1 6XFFF   Narrative:  I spoke with the patient to conduct his routine scheduled 3 month follow up visit to review results of his recent MRI scans via telephone to spare the patient unnecessary potential exposure in the healthcare setting during the current COVID-19 pandemic.  The patient was notified in advance and gave permission to proceed with this visit format.  He tolerated radiation therapy very well and has appreciated significant improvement in his back pain. He has continued to tolerate the systemic therapy well and completed 4 cycles of the carboplatin/Alimta/Keytruda and before transitioning to maintenance therapy with Alimta/Keytruda only starting from cycle 5. He feels like his energy and strength continue improving since coming off the carboplatinin and he has been able to gradually increase his activity level which he is pleased with.  His restaging CT C/A/P scans from 05/15/23 continue to show an excellent response to treatment with no evidence for recurrence or extraosseous metastatic disease in the thorax, abdomen or pelvis and a stable appearance of the osseous metastatic disease in the spine. The plan is to continue his current systemic therapy and follow up with Dr. Arbutus Ped 06/26/23.  He had  a recent MRI brain scan on 06/09/23 that showed overall improved disease with resolution of  the treated left temporal and right occipital lesions and no change in the appearance of the treated punctate lesion in the right cerebellum. No new lesions were identified. MRI of the lumbar and thoracic spine were performed that same day and this showed stability of the treated metastatic disease in the lumbar spine (L2) and an overall unchanged appearance of the metastatic disease in the thoracic and lumbar spine compared to the MRI from 03/01/2023. No new lesions or epidural soft tissue tumor is identified. We reviewed these results today.  On review of systems, the patient states that he is doing well in general. He feels like he is getting stronger each day and continues with more energy since completing the chemotherapy portion of his treatment.  He does continue with some mild back pain in the mid to lower back but reports that this is well-controlled with pain medication.  He continues using a back brace for support and has joined [employer] to help with stretching, strength and balance.  He denies any paresthesias or focal weakness in the upper or lower extremities and has not noticed any change in his bowel or bladder habits.  He does have occasional muscle cramps/tightness in the low back, at the site of kyphoplasty, particularly in the mornings but these usually resolve spontaneously throughout the day with repositioning, stretching and massage. He denies headaches, nausea, vomiting, dizziness, slurred speech, changes in visual or auditory acuity or tremors.  Overall, he is quite pleased with his progress to date.  ALLERGIES:  is allergic to penicillins and percocet [oxycodone-acetaminophen].  Meds: Current Outpatient  Medications  Medication Sig Dispense Refill   Accu-Chek Softclix Lancets lancets Use to check blood sugar 3 times daily. E11.69 100 each 6   atorvastatin (LIPITOR) 40 MG tablet Take 1 tablet (40 mg total) by mouth daily. 90 tablet 1   benzonatate (TESSALON) 100 MG capsule  Take 1 capsule (100 mg total) by mouth 2 (two) times daily as needed. For cough 30 capsule 2   blood glucose meter kit and supplies KIT Dispense based on patient and insurance preference. Use up to four times daily as directed. 1 each 0   Blood Glucose Monitoring Suppl (ACCU-CHEK GUIDE) w/Device KIT Use to check blood sugar 3 times daily. E11.69 1 kit 0   empagliflozin (JARDIANCE) 10 MG TABS tablet Take 1 tablet (10 mg total) by mouth daily before breakfast. 60 tablet 4   folic acid (FOLVITE) 1 MG tablet Take 1 tablet (1 mg total) by mouth daily. 30 tablet 4   gabapentin (NEURONTIN) 300 MG capsule Take 2 capsules (600 mg total) by mouth 3 (three) times daily. 180 capsule 1   glucose blood (ACCU-CHEK GUIDE) test strip Use to check blood sugar 3 times daily. E11.69 100 each 6   insulin glargine (LANTUS SOLOSTAR) 100 UNIT/ML Solostar Pen Inject 40 Units into the skin daily. 15 mL 11   Insulin Pen Needle 32G X 4 MM MISC Use with Insulin pen 100 each 3   ipratropium-albuterol (DUONEB) 0.5-2.5 (3) MG/3ML SOLN Take 3 mLs by nebulization every 6 (six) hours as needed (Shortness of breath). (Patient not taking: Reported on 06/05/2023) 360 mL 0   lidocaine-prilocaine (EMLA) cream Apply to the Port-A-Cath site 30-60 minutes before chemo. (Patient not taking: Reported on 06/05/2023) 30 g 0   nicotine polacrilex (NICORETTE MINI) 4 MG lozenge Use 3 times a day as directed to quit smoking (Patient not taking: Reported on 06/05/2023) 100 tablet 4   Oxycodone HCl 10 MG TABS Take 1 tablet (10 mg) by mouth every 6 hours as needed for severe pain. 60 tablet 0   prochlorperazine (COMPAZINE) 10 MG tablet Take 1 tablet (10 mg total) by mouth every 6 (six) hours as needed for nausea or vomiting. (Patient not taking: Reported on 06/05/2023) 30 tablet 0   sildenafil (VIAGRA) 100 MG tablet Take 1/2 - 1 tablet by mouth daily as needed for erectile dysfunction. 5 tablet 11   sildenafil (VIAGRA) 100 MG tablet Take 1/2 - 1 tablet by  mouth daily as needed for erectile dysfunction. 15 tablet 0   tiZANidine (ZANAFLEX) 2 MG tablet Take 1 tablet (2 mg) by mouth at bedtime. 30 tablet 1   No current facility-administered medications for this encounter.    Physical Findings:  vitals were not taken for this visit.   /10 Unable to assess due to telephone follow-up visit format.  Lab Findings: Lab Results  Component Value Date   WBC 7.0 06/05/2023   HGB 12.5 (L) 06/05/2023   HCT 36.2 (L) 06/05/2023   MCV 97.6 06/05/2023   PLT 239 06/05/2023     Radiographic Findings: MR Lumbar Spine W Wo Contrast  Result Date: 06/13/2023 CLINICAL DATA:  Follow-up metastatic disease EXAM: MRI THORACIC AND LUMBAR SPINE WITHOUT AND WITH CONTRAST TECHNIQUE: Multiplanar and multiecho pulse sequences of the thoracic and lumbar spine were obtained without and with intravenous contrast. CONTRAST:  6.59mL GADAVIST GADOBUTROL 1 MMOL/ML IV SOLN COMPARISON:  03/01/2023 FINDINGS: MRI THORACIC SPINE FINDINGS Alignment:  Normal Vertebrae: STIR hyperintense metastatic lesions in the T3 and T9 vertebral bodies  are non progressed, up to 9 mm at T3. No new lesion or fracture. No evidence of extraosseous tumor. Cord:  Normal Paraspinal and other soft tissues: No paravertebral mass or inflammation Disc levels: No significant degenerative change MRI LUMBAR SPINE FINDINGS Segmentation:  Standard. Alignment:  Unremarkable Vertebrae: L2 metastatic disease with advanced compression fracture and retropulsion causing compressive thecal sac stenosis. Cement augmentation has been performed. Accentuated fatty marrow at L1 and below attributed to radiotherapy. No new lesion is seen. Chronic superior endplate fracture at L4. Hypointense appearance of the L3 posterior body marrow is stable and likely from underlying hemangioma. Conus medullaris: Extends to the L1 level and appears normal. Paraspinal and other soft tissues: No perispinal mass or inflammation. Disc levels: L1-L2:  Compressive thecal sac stenosis from retropulsion. Mild bilateral foraminal narrowing. L2-L3: Advanced thecal sac stenosis from retropulsion. Biforaminal impingement from retropulsion, bulging disc, and facet spurring. L3-L4: Bulging disc and degenerative facet spurring. L4-L5: Disc narrowing and bulging with endplate and facet spurring eccentric to the left. Left subarticular recess stenosis that could affect the left L5 nerve root. Moderate left foraminal stenosis. L5-S1:Disc narrowing with endplate ridging. Degenerative facet spurring on both sides. Mild bilateral foraminal narrowing. IMPRESSION: 1. No new or progressive metastatic disease in the thoracic and lumbar spine. 2. Unchanged L2 compression fracture with retropulsion causing thecal sac compression and biforaminal impingement at L2-3. 3. Degenerative narrowings described above. Electronically Signed   By: Tiburcio Pea M.D.   On: 06/13/2023 09:20   MR THORACIC SPINE W WO CONTRAST  Result Date: 06/13/2023 CLINICAL DATA:  Follow-up metastatic disease EXAM: MRI THORACIC AND LUMBAR SPINE WITHOUT AND WITH CONTRAST TECHNIQUE: Multiplanar and multiecho pulse sequences of the thoracic and lumbar spine were obtained without and with intravenous contrast. CONTRAST:  6.14mL GADAVIST GADOBUTROL 1 MMOL/ML IV SOLN COMPARISON:  03/01/2023 FINDINGS: MRI THORACIC SPINE FINDINGS Alignment:  Normal Vertebrae: STIR hyperintense metastatic lesions in the T3 and T9 vertebral bodies are non progressed, up to 9 mm at T3. No new lesion or fracture. No evidence of extraosseous tumor. Cord:  Normal Paraspinal and other soft tissues: No paravertebral mass or inflammation Disc levels: No significant degenerative change MRI LUMBAR SPINE FINDINGS Segmentation:  Standard. Alignment:  Unremarkable Vertebrae: L2 metastatic disease with advanced compression fracture and retropulsion causing compressive thecal sac stenosis. Cement augmentation has been performed. Accentuated fatty  marrow at L1 and below attributed to radiotherapy. No new lesion is seen. Chronic superior endplate fracture at L4. Hypointense appearance of the L3 posterior body marrow is stable and likely from underlying hemangioma. Conus medullaris: Extends to the L1 level and appears normal. Paraspinal and other soft tissues: No perispinal mass or inflammation. Disc levels: L1-L2: Compressive thecal sac stenosis from retropulsion. Mild bilateral foraminal narrowing. L2-L3: Advanced thecal sac stenosis from retropulsion. Biforaminal impingement from retropulsion, bulging disc, and facet spurring. L3-L4: Bulging disc and degenerative facet spurring. L4-L5: Disc narrowing and bulging with endplate and facet spurring eccentric to the left. Left subarticular recess stenosis that could affect the left L5 nerve root. Moderate left foraminal stenosis. L5-S1:Disc narrowing with endplate ridging. Degenerative facet spurring on both sides. Mild bilateral foraminal narrowing. IMPRESSION: 1. No new or progressive metastatic disease in the thoracic and lumbar spine. 2. Unchanged L2 compression fracture with retropulsion causing thecal sac compression and biforaminal impingement at L2-3. 3. Degenerative narrowings described above. Electronically Signed   By: Tiburcio Pea M.D.   On: 06/13/2023 09:20   MR Brain W Wo Contrast  Result Date: 06/13/2023  CLINICAL DATA:  Follow-up metastatic disease EXAM: MRI HEAD WITHOUT AND WITH CONTRAST TECHNIQUE: Multiplanar, multiecho pulse sequences of the brain and surrounding structures were obtained without and with intravenous contrast. CONTRAST:  6.63mL GADAVIST GADOBUTROL 1 MMOL/ML IV SOLN COMPARISON:  03/01/2023 FINDINGS: Brain: Punctate right cerebellar enhancement on 2700:106 is unchanged. No new metastasis, edema, or brain swelling. No incidental infarct, hydrocephalus, or collection. Vascular: Major flow voids and vascular enhancements are preserved. Skull and upper cervical spine: No focal  marrow lesion. Occipital scalp lipoma which is simple appearing Sinuses/Orbits: Negative IMPRESSION: No new or progressive disease. Single punctate enhancing lesion in the right cerebellum is stable.a Electronically Signed   By: Tiburcio Pea M.D.   On: 06/13/2023 09:08   CT Chest W Contrast  Result Date: 05/18/2023 CLINICAL DATA:  57 year old male with history of non-small cell lung cancer with metastatic disease to the brain and spine undergoing ongoing chemotherapy and radiation therapy. Follow-up study. * Tracking Code: BO * EXAM: CT CHEST, ABDOMEN, AND PELVIS WITH CONTRAST TECHNIQUE: Multidetector CT imaging of the chest, abdomen and pelvis was performed following the standard protocol during bolus administration of intravenous contrast. RADIATION DOSE REDUCTION: This exam was performed according to the departmental dose-optimization program which includes automated exposure control, adjustment of the mA and/or kV according to patient size and/or use of iterative reconstruction technique. CONTRAST:  OMNIPAQUE IOHEXOL 300 MG/ML  SOLN COMPARISON:  CT of the chest, abdomen and pelvis 03/10/2023. FINDINGS: CT CHEST FINDINGS Cardiovascular: Heart size is normal. There is no significant pericardial fluid, thickening or pericardial calcification. There is aortic atherosclerosis, as well as atherosclerosis of the great vessels of the mediastinum and the coronary arteries, including calcified atherosclerotic plaque in the left main coronary artery. Right internal jugular single-lumen Port-A-Cath with tip terminating in the right atrium. Mediastinum/Nodes: No pathologically enlarged mediastinal or hilar lymph nodes. Esophagus is unremarkable in appearance. No axillary lymphadenopathy. Small amount of retained secretions in the proximal trachea incidentally noted. Lungs/Pleura: No suspicious appearing pulmonary nodules or masses are noted. No acute consolidative airspace disease. No pleural effusions. Mild  diffuse bronchial wall thickening with mild centrilobular and paraseptal emphysema. Musculoskeletal: Mixed lytic and sclerotic lesion in the T3 vertebral body appears similar to the prior examination. No other new aggressive appearing lytic or blastic lesions are noted elsewhere in the visualized portions of the axial and appendicular skeleton. Multiple old healed bilateral rib fractures are incidentally noted. CT ABDOMEN PELVIS FINDINGS Hepatobiliary: Subcentimeter low-attenuation lesion in segment 6 of the liver, too small to characterize, but similar to prior studies and statistically likely a tiny cyst. No other suspicious appearing hepatic lesions are noted. No intra or extrahepatic biliary ductal dilatation. Gallbladder is unremarkable in appearance. Pancreas: No pancreatic mass. No pancreatic ductal dilatation. No pancreatic or peripancreatic fluid collections or inflammatory changes. Spleen: Unremarkable. Adrenals/Urinary Tract: Tiny nonobstructive calculi are noted within the right renal collecting system measuring 1-2 mm in size. Bilateral kidneys are otherwise normal in appearance. No hydroureteronephrosis. Urinary bladder is nearly completely decompressed, but otherwise unremarkable in appearance. Stomach/Bowel: The appearance of the stomach is normal. No pathologic dilatation of small bowel or colon. Normal appendix. Vascular/Lymphatic: Extensive aortic atherosclerosis, without evidence of aneurysm or dissection in the abdominal or pelvic vasculature. No definite lymphadenopathy noted in the abdomen or pelvis. Reproductive: Prostate gland and seminal vesicles are unremarkable in appearance. Other: No significant volume of ascites.  No pneumoperitoneum. Musculoskeletal: Multiple mixed lytic and sclerotic lesions are again noted in the lumbar spine, largest of  which is in the right side of the L3 vertebral body measuring up to 3.4 x 2.6 cm, similar to the prior study. Sclerosis and chronic compression  fracture of L2 with post vertebroplasty changes, similar to the prior examination, presumably a treated pathologic compression fracture. No definite new osseous lesions are noted elsewhere in the visualized portions of the axial or appendicular skeleton. IMPRESSION: 1. Osseous metastatic disease throughout the thoracolumbar spine, unchanged compared to prior examinations. 2. No new sites of extraosseous metastatic disease identified elsewhere in the chest, abdomen or pelvis. 3. Several tiny 1-2 mm nonobstructive calculi in the right kidney. 4. Aortic atherosclerosis, in addition to left main coronary artery disease. Please note that although the presence of coronary artery calcium documents the presence of coronary artery disease, the severity of this disease and any potential stenosis cannot be assessed on this non-gated CT examination. Assessment for potential risk factor modification, dietary therapy or pharmacologic therapy may be warranted, if clinically indicated. 5. Mild diffuse bronchial wall thickening with mild centrilobular and paraseptal emphysema; imaging findings suggestive of underlying COPD. Electronically Signed   By: Trudie Reed M.D.   On: 05/18/2023 10:16   CT Abdomen Pelvis W Contrast  Result Date: 05/18/2023 CLINICAL DATA:  57 year old male with history of non-small cell lung cancer with metastatic disease to the brain and spine undergoing ongoing chemotherapy and radiation therapy. Follow-up study. * Tracking Code: BO * EXAM: CT CHEST, ABDOMEN, AND PELVIS WITH CONTRAST TECHNIQUE: Multidetector CT imaging of the chest, abdomen and pelvis was performed following the standard protocol during bolus administration of intravenous contrast. RADIATION DOSE REDUCTION: This exam was performed according to the departmental dose-optimization program which includes automated exposure control, adjustment of the mA and/or kV according to patient size and/or use of iterative reconstruction technique.  CONTRAST:  OMNIPAQUE IOHEXOL 300 MG/ML  SOLN COMPARISON:  CT of the chest, abdomen and pelvis 03/10/2023. FINDINGS: CT CHEST FINDINGS Cardiovascular: Heart size is normal. There is no significant pericardial fluid, thickening or pericardial calcification. There is aortic atherosclerosis, as well as atherosclerosis of the great vessels of the mediastinum and the coronary arteries, including calcified atherosclerotic plaque in the left main coronary artery. Right internal jugular single-lumen Port-A-Cath with tip terminating in the right atrium. Mediastinum/Nodes: No pathologically enlarged mediastinal or hilar lymph nodes. Esophagus is unremarkable in appearance. No axillary lymphadenopathy. Small amount of retained secretions in the proximal trachea incidentally noted. Lungs/Pleura: No suspicious appearing pulmonary nodules or masses are noted. No acute consolidative airspace disease. No pleural effusions. Mild diffuse bronchial wall thickening with mild centrilobular and paraseptal emphysema. Musculoskeletal: Mixed lytic and sclerotic lesion in the T3 vertebral body appears similar to the prior examination. No other new aggressive appearing lytic or blastic lesions are noted elsewhere in the visualized portions of the axial and appendicular skeleton. Multiple old healed bilateral rib fractures are incidentally noted. CT ABDOMEN PELVIS FINDINGS Hepatobiliary: Subcentimeter low-attenuation lesion in segment 6 of the liver, too small to characterize, but similar to prior studies and statistically likely a tiny cyst. No other suspicious appearing hepatic lesions are noted. No intra or extrahepatic biliary ductal dilatation. Gallbladder is unremarkable in appearance. Pancreas: No pancreatic mass. No pancreatic ductal dilatation. No pancreatic or peripancreatic fluid collections or inflammatory changes. Spleen: Unremarkable. Adrenals/Urinary Tract: Tiny nonobstructive calculi are noted within the right renal  collecting system measuring 1-2 mm in size. Bilateral kidneys are otherwise normal in appearance. No hydroureteronephrosis. Urinary bladder is nearly completely decompressed, but otherwise unremarkable in appearance. Stomach/Bowel:  The appearance of the stomach is normal. No pathologic dilatation of small bowel or colon. Normal appendix. Vascular/Lymphatic: Extensive aortic atherosclerosis, without evidence of aneurysm or dissection in the abdominal or pelvic vasculature. No definite lymphadenopathy noted in the abdomen or pelvis. Reproductive: Prostate gland and seminal vesicles are unremarkable in appearance. Other: No significant volume of ascites.  No pneumoperitoneum. Musculoskeletal: Multiple mixed lytic and sclerotic lesions are again noted in the lumbar spine, largest of which is in the right side of the L3 vertebral body measuring up to 3.4 x 2.6 cm, similar to the prior study. Sclerosis and chronic compression fracture of L2 with post vertebroplasty changes, similar to the prior examination, presumably a treated pathologic compression fracture. No definite new osseous lesions are noted elsewhere in the visualized portions of the axial or appendicular skeleton. IMPRESSION: 1. Osseous metastatic disease throughout the thoracolumbar spine, unchanged compared to prior examinations. 2. No new sites of extraosseous metastatic disease identified elsewhere in the chest, abdomen or pelvis. 3. Several tiny 1-2 mm nonobstructive calculi in the right kidney. 4. Aortic atherosclerosis, in addition to left main coronary artery disease. Please note that although the presence of coronary artery calcium documents the presence of coronary artery disease, the severity of this disease and any potential stenosis cannot be assessed on this non-gated CT examination. Assessment for potential risk factor modification, dietary therapy or pharmacologic therapy may be warranted, if clinically indicated. 5. Mild diffuse bronchial wall  thickening with mild centrilobular and paraseptal emphysema; imaging findings suggestive of underlying COPD. Electronically Signed   By: Trudie Reed M.D.   On: 05/18/2023 10:16    Impression/Plan: 1. 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  He appears to have recovered well from the effects of the radiation and is currently without complaints.  His brain disease appears well controlled on recent MRI brain scan. He continues with mild back pain that is well-controlled with pain medications as needed.  We reviewed the results of his recent MRI scans of the thoracic and lumbar spine which show a stable appearance of the metastatic lesions in the thoracic spine at T3 and T9.  Fortunately, these remain small and are not causing him any significant discomfort so he prefers to continue to monitor these on repeat scans.  He understands the potential role for palliative radiotherapy to these areas in the future should they become more bothersome or no longer responding to the pain medication.  At this point, he feels like his pain is well-controlled.  At this point, we will plan to continue to monitor the brain with repeat MRI scans in 3 months and a telephone follow-up visit thereafter to review results and recommendations from the multidisciplinary brain and spine conference.  We will only repeat MRI of the spine if he develops new or worsening pain and/or there is any evidence of disease progression on future CT imaging of the C/A/P.  He knows that he is to call and/or seek evaluation immediately should he develop any uncontrolled pain or neurologic dysfunction as discussed today.  He will continue in routine follow up with Dr. Arbutus Ped as well, for continued management of his systemic disease. He has a good understanding of our recommendations and is comfortable and in agreement with the stated plan.   I  personally spent 20 minutes in this encounter including chart review, reviewing radiological studies, telephone conversation with the patient, entering  orders and completing documentation.    Marguarite Arbour, PA-C

## 2023-06-15 ENCOUNTER — Other Ambulatory Visit: Payer: Self-pay | Admitting: Radiation Therapy

## 2023-06-15 DIAGNOSIS — C7931 Secondary malignant neoplasm of brain: Secondary | ICD-10-CM

## 2023-06-26 ENCOUNTER — Inpatient Hospital Stay: Payer: Medicaid Other | Attending: Adult Health

## 2023-06-26 ENCOUNTER — Inpatient Hospital Stay (HOSPITAL_BASED_OUTPATIENT_CLINIC_OR_DEPARTMENT_OTHER): Payer: Medicaid Other | Admitting: Internal Medicine

## 2023-06-26 ENCOUNTER — Inpatient Hospital Stay: Payer: Medicaid Other

## 2023-06-26 ENCOUNTER — Other Ambulatory Visit: Payer: Medicaid Other

## 2023-06-26 ENCOUNTER — Other Ambulatory Visit: Payer: Self-pay | Admitting: Nurse Practitioner

## 2023-06-26 ENCOUNTER — Other Ambulatory Visit (HOSPITAL_COMMUNITY): Payer: Self-pay

## 2023-06-26 ENCOUNTER — Other Ambulatory Visit: Payer: Self-pay

## 2023-06-26 VITALS — BP 121/79 | HR 81 | Temp 98.6°F | Resp 16 | Ht 68.0 in | Wt 147.9 lb

## 2023-06-26 DIAGNOSIS — R5383 Other fatigue: Secondary | ICD-10-CM | POA: Insufficient documentation

## 2023-06-26 DIAGNOSIS — Z7984 Long term (current) use of oral hypoglycemic drugs: Secondary | ICD-10-CM | POA: Diagnosis not present

## 2023-06-26 DIAGNOSIS — Z5111 Encounter for antineoplastic chemotherapy: Secondary | ICD-10-CM | POA: Diagnosis present

## 2023-06-26 DIAGNOSIS — Z515 Encounter for palliative care: Secondary | ICD-10-CM

## 2023-06-26 DIAGNOSIS — E119 Type 2 diabetes mellitus without complications: Secondary | ICD-10-CM | POA: Diagnosis not present

## 2023-06-26 DIAGNOSIS — M792 Neuralgia and neuritis, unspecified: Secondary | ICD-10-CM

## 2023-06-26 DIAGNOSIS — F1020 Alcohol dependence, uncomplicated: Secondary | ICD-10-CM | POA: Diagnosis not present

## 2023-06-26 DIAGNOSIS — M48061 Spinal stenosis, lumbar region without neurogenic claudication: Secondary | ICD-10-CM | POA: Insufficient documentation

## 2023-06-26 DIAGNOSIS — C3490 Malignant neoplasm of unspecified part of unspecified bronchus or lung: Secondary | ICD-10-CM

## 2023-06-26 DIAGNOSIS — Z5112 Encounter for antineoplastic immunotherapy: Secondary | ICD-10-CM | POA: Insufficient documentation

## 2023-06-26 DIAGNOSIS — R918 Other nonspecific abnormal finding of lung field: Secondary | ICD-10-CM | POA: Insufficient documentation

## 2023-06-26 DIAGNOSIS — C3491 Malignant neoplasm of unspecified part of right bronchus or lung: Secondary | ICD-10-CM

## 2023-06-26 DIAGNOSIS — C7931 Secondary malignant neoplasm of brain: Secondary | ICD-10-CM | POA: Insufficient documentation

## 2023-06-26 DIAGNOSIS — C7951 Secondary malignant neoplasm of bone: Secondary | ICD-10-CM | POA: Insufficient documentation

## 2023-06-26 DIAGNOSIS — J90 Pleural effusion, not elsewhere classified: Secondary | ICD-10-CM | POA: Diagnosis not present

## 2023-06-26 DIAGNOSIS — G893 Neoplasm related pain (acute) (chronic): Secondary | ICD-10-CM

## 2023-06-26 DIAGNOSIS — Z79899 Other long term (current) drug therapy: Secondary | ICD-10-CM | POA: Diagnosis not present

## 2023-06-26 DIAGNOSIS — F17210 Nicotine dependence, cigarettes, uncomplicated: Secondary | ICD-10-CM | POA: Insufficient documentation

## 2023-06-26 DIAGNOSIS — R739 Hyperglycemia, unspecified: Secondary | ICD-10-CM

## 2023-06-26 LAB — CBC WITH DIFFERENTIAL (CANCER CENTER ONLY)
Abs Immature Granulocytes: 0.04 10*3/uL (ref 0.00–0.07)
Basophils Absolute: 0 10*3/uL (ref 0.0–0.1)
Basophils Relative: 0 %
Eosinophils Absolute: 0.1 10*3/uL (ref 0.0–0.5)
Eosinophils Relative: 1 %
HCT: 38.4 % — ABNORMAL LOW (ref 39.0–52.0)
Hemoglobin: 13.1 g/dL (ref 13.0–17.0)
Immature Granulocytes: 0 %
Lymphocytes Relative: 9 %
Lymphs Abs: 0.8 10*3/uL (ref 0.7–4.0)
MCH: 33.1 pg (ref 26.0–34.0)
MCHC: 34.1 g/dL (ref 30.0–36.0)
MCV: 97 fL (ref 80.0–100.0)
Monocytes Absolute: 0.5 10*3/uL (ref 0.1–1.0)
Monocytes Relative: 5 %
Neutro Abs: 7.7 10*3/uL (ref 1.7–7.7)
Neutrophils Relative %: 85 %
Platelet Count: 221 10*3/uL (ref 150–400)
RBC: 3.96 MIL/uL — ABNORMAL LOW (ref 4.22–5.81)
RDW: 13 % (ref 11.5–15.5)
WBC Count: 9.1 10*3/uL (ref 4.0–10.5)
nRBC: 0 % (ref 0.0–0.2)

## 2023-06-26 LAB — CMP (CANCER CENTER ONLY)
ALT: 32 U/L (ref 0–44)
AST: 26 U/L (ref 15–41)
Albumin: 3.7 g/dL (ref 3.5–5.0)
Alkaline Phosphatase: 74 U/L (ref 38–126)
Anion gap: 6 (ref 5–15)
BUN: 14 mg/dL (ref 6–20)
CO2: 28 mmol/L (ref 22–32)
Calcium: 8.8 mg/dL — ABNORMAL LOW (ref 8.9–10.3)
Chloride: 105 mmol/L (ref 98–111)
Creatinine: 0.59 mg/dL — ABNORMAL LOW (ref 0.61–1.24)
GFR, Estimated: >60 mL/min (ref >60)
Glucose, Bld: 286 mg/dL — ABNORMAL HIGH (ref 70–99)
Potassium: 3.7 mmol/L (ref 3.5–5.1)
Sodium: 139 mmol/L (ref 135–145)
Total Bilirubin: 0.4 mg/dL (ref 0.3–1.2)
Total Protein: 6.9 g/dL (ref 6.5–8.1)

## 2023-06-26 LAB — TSH: TSH: 0.663 u[IU]/mL (ref 0.350–4.500)

## 2023-06-26 MED ORDER — SODIUM CHLORIDE 0.9 % IV SOLN
500.0000 mg/m2 | Freq: Once | INTRAVENOUS | Status: AC
  Administered 2023-06-26: 900 mg via INTRAVENOUS
  Filled 2023-06-26: qty 20

## 2023-06-26 MED ORDER — SODIUM CHLORIDE 0.9 % IV SOLN: Freq: Once | INTRAVENOUS | Status: AC

## 2023-06-26 MED ORDER — PROCHLORPERAZINE MALEATE 10 MG PO TABS
10.0000 mg | ORAL_TABLET | Freq: Once | ORAL | Status: AC
  Administered 2023-06-26: 10 mg via ORAL
  Filled 2023-06-26: qty 1

## 2023-06-26 MED ORDER — HEPARIN SOD (PORK) LOCK FLUSH 100 UNIT/ML IV SOLN
500.0000 [IU] | Freq: Once | INTRAVENOUS | Status: AC | PRN
  Administered 2023-06-26: 500 [IU]

## 2023-06-26 MED ORDER — SODIUM CHLORIDE 0.9% FLUSH
10.0000 mL | Freq: Once | INTRAVENOUS | Status: AC
  Administered 2023-06-26: 10 mL

## 2023-06-26 MED ORDER — OXYCODONE HCL 10 MG PO TABS
10.0000 mg | ORAL_TABLET | Freq: Four times a day (QID) | ORAL | 0 refills | Status: DC | PRN
  Filled 2023-06-26: qty 60, 15d supply, fill #0

## 2023-06-26 MED ORDER — SODIUM CHLORIDE 0.9 % IV SOLN
200.0000 mg | Freq: Once | INTRAVENOUS | Status: AC
  Administered 2023-06-26: 200 mg via INTRAVENOUS
  Filled 2023-06-26: qty 200

## 2023-06-26 MED ORDER — INSULIN ASPART 100 UNIT/ML IJ SOLN
6.0000 [IU] | Freq: Once | INTRAMUSCULAR | Status: AC
  Administered 2023-06-26: 6 [IU] via SUBCUTANEOUS
  Filled 2023-06-26: qty 1

## 2023-06-26 MED ORDER — SODIUM CHLORIDE 0.9% FLUSH
10.0000 mL | INTRAVENOUS | Status: DC | PRN
  Administered 2023-06-26: 10 mL

## 2023-06-26 MED ORDER — CYANOCOBALAMIN 1000 MCG/ML IJ SOLN
1000.0000 ug | Freq: Once | INTRAMUSCULAR | Status: AC
  Administered 2023-06-26: 1000 ug via INTRAMUSCULAR
  Filled 2023-06-26: qty 1

## 2023-06-26 NOTE — Progress Notes (Signed)
[name] [phone]) [phone]   [phone]  OFFICE PROGRESS NOTE  [name], MD 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  Biomarker Findings Microsatellite status - MS-Stable Tumor Mutational Burden - 8 Muts/Mb Genomic Findings For a complete list of the genes assayed, please refer to the Appendix. KEAP1 Q227* CDKN2A/B [id number] G89V PRKCI amplification TERC amplification - equivocal? TP53 [id number]*18 8 Disease relevant genes with no reportable alterations: ALK, BRAF, EGFR, ERBB2, KRAS, MET, RET, ROS1  PDL1 TPS  90%  PRIOR THERAPY:  1) Status post palliative radiotherapy to the L2 lesion under the care of Dr. Kathrynn Running. 2) Status post SRS to solitary brain metastasis under the care of Dr. Kathrynn Running  CURRENT THERAPY: Palliative systemic therapy with carboplatin for AUC of 5, Alimta 500 Mg/M2 and Keytruda 200 Mg IV every 3 weeks.  First dose September 05, 2022.  Status post 14 cycles.  Starting from cycle #5 the patient will be on maintenance treatment with Alimta and Keytruda every 3 weeks.  INTERVAL HISTORY: Samuel [name] II 57 y.o. male returns to the clinic today for follow-up visit.  The patient is feeling fine today with no concerning complaints.  He denied having any current chest pain, shortness of breath, cough or hemoptysis.  He has no nausea, vomiting, diarrhea or constipation.  He has no headache or visual changes.  He denied having any recent weight loss or night sweats.  He is tolerating his systemic chemotherapy fairly well.  The patient is here today  for evaluation before starting cycle #15.   MEDICAL HISTORY: Past Medical History:  Diagnosis Date   Alcohol use disorder, severe, dependence (HCC) 12/26/2016   Diabetes mellitus without complication (HCC)    Metastatic cancer to brain (HCC) 08/05/2022   Metastatic cancer to spine (HCC) 07/19/2022   Pancreatitis unk   Primary lung adenocarcinoma (HCC) 08/05/2022   Suicidal ideation    Type 2 diabetes mellitus with hyperglycemia, without long-term current use of insulin (HCC) 06/13/2022   Formatting of this note might be different from the original. 06/13/2022 A1C 13.8, FSBG 414 Start empagliflozin 5mg /metformin 1000 daily, levemir 20 qhs. (Samples given) Will apply for medassist for pharmacy    ALLERGIES:  is allergic to penicillins and percocet [oxycodone-acetaminophen].  MEDICATIONS:  Current Outpatient Medications  Medication Sig Dispense Refill   Accu-Chek Softclix Lancets lancets Use to check blood sugar 3 times daily. E11.69 100 each 6   atorvastatin (LIPITOR) 40 MG tablet Take 1 tablet (40 mg total) by mouth daily. 90 tablet 1   benzonatate (TESSALON) 100 MG capsule Take 1 capsule (100 mg total) by mouth 2 (two) times daily as needed. For cough 30 capsule 2   blood glucose meter kit and supplies KIT Dispense based on patient and insurance preference. Use up to four times daily as directed. 1 each 0   Blood Glucose Monitoring Suppl (ACCU-CHEK GUIDE) w/Device KIT Use to check blood sugar 3 times daily. E11.69 1 kit 0   empagliflozin (JARDIANCE) 10 MG TABS tablet Take 1 tablet (10 mg total) by mouth daily before breakfast. 60 tablet 4   folic acid (FOLVITE)  1 MG tablet Take 1 tablet (1 mg total) by mouth daily. 30 tablet 4   gabapentin (NEURONTIN) 300 MG capsule Take 2 capsules (600 mg total) by mouth 3 (three) times daily. 180 capsule 1   glucose blood (ACCU-CHEK GUIDE) test strip Use to check blood sugar 3 times daily. E11.69 100 each 6   insulin glargine (LANTUS SOLOSTAR) 100  UNIT/ML Solostar Pen Inject 40 Units into the skin daily. 15 mL 11   Insulin Pen Needle 32G X 4 MM MISC Use with Insulin pen 100 each 3   ipratropium-albuterol (DUONEB) 0.5-2.5 (3) MG/3ML SOLN Take 3 mLs by nebulization every 6 (six) hours as needed (Shortness of breath). (Patient not taking: Reported on 06/05/2023) 360 mL 0   lidocaine-prilocaine (EMLA) cream Apply to the Port-A-Cath site 30-60 minutes before chemo. (Patient not taking: Reported on 06/05/2023) 30 g 0   nicotine polacrilex (NICORETTE MINI) 4 MG lozenge Use 3 times a day as directed to quit smoking (Patient not taking: Reported on 06/05/2023) 100 tablet 4   Oxycodone HCl 10 MG TABS Take 1 tablet (10 mg) by mouth every 6 hours as needed for severe pain. 60 tablet 0   prochlorperazine (COMPAZINE) 10 MG tablet Take 1 tablet (10 mg total) by mouth every 6 (six) hours as needed for nausea or vomiting. (Patient not taking: Reported on 06/05/2023) 30 tablet 0   sildenafil (VIAGRA) 100 MG tablet Take 1/2 - 1 tablet by mouth daily as needed for erectile dysfunction. 5 tablet 11   sildenafil (VIAGRA) 100 MG tablet Take 1/2 - 1 tablet by mouth daily as needed for erectile dysfunction. 15 tablet 0   tiZANidine (ZANAFLEX) 2 MG tablet Take 1 tablet (2 mg) by mouth at bedtime. 30 tablet 1   No current facility-administered medications for this visit.    SURGICAL HISTORY:  Past Surgical History:  Procedure Laterality Date   IR BONE TUMOR(S)RF ABLATION  08/01/2022   IR IMAGING GUIDED PORT INSERTION  09/21/2022   IR KYPHO LUMBAR INC FX REDUCE BONE BX UNI/BIL CANNULATION INC/IMAGING  08/01/2022    REVIEW OF SYSTEMS:  A comprehensive review of systems was negative.   PHYSICAL EXAMINATION: General appearance: alert, cooperative, and no distress Head: Normocephalic, without obvious abnormality, atraumatic Neck: no adenopathy, no JVD, supple, symmetrical, trachea midline, and thyroid not enlarged, symmetric, no tenderness/mass/nodules Lymph nodes:  Cervical, supraclavicular, and axillary nodes normal. Resp: clear to auscultation bilaterally Back: symmetric, no curvature. ROM normal. No CVA tenderness. Cardio: regular rate and rhythm, S1, S2 normal, no murmur, click, rub or gallop GI: soft, non-tender; bowel sounds normal; no masses,  no organomegaly Extremities: extremities normal, atraumatic, no cyanosis or edema  ECOG PERFORMANCE STATUS: 1 - Symptomatic but completely ambulatory  Blood pressure 121/79, pulse 81, temperature 98.6 F (37 C), temperature source Oral, resp. rate 16, height 5\' 8"  (1.727 m), weight 147 lb 14.4 oz (67.1 kg), SpO2 99%.  LABORATORY DATA: Lab Results  Component Value Date   WBC 7.0 06/05/2023   HGB 12.5 (L) 06/05/2023   HCT 36.2 (L) 06/05/2023   MCV 97.6 06/05/2023   PLT 239 06/05/2023      Chemistry      Component Value Date/Time   NA 137 06/05/2023 1009   NA 140 12/24/2020 1144   NA 142 08/02/2014 1649   K 3.5 06/05/2023 1009   K 4.2 08/02/2014 1649   CL 106 06/05/2023 1009   CL 109 (H) 08/02/2014 1649   CO2 27 06/05/2023 1009  CO2 21 08/02/2014 1649   BUN 11 06/05/2023 1009   BUN 7 12/24/2020 1144   BUN 11 08/02/2014 1649   CREATININE 0.53 (L) 06/05/2023 1009   CREATININE 0.92 08/02/2014 1649      Component Value Date/Time   CALCIUM 9.2 06/05/2023 1009   CALCIUM 8.6 08/02/2014 1649   ALKPHOS 66 06/05/2023 1009   ALKPHOS 82 08/02/2014 1649   AST 21 06/05/2023 1009   ALT 24 06/05/2023 1009   ALT 132 (H) 08/02/2014 1649   BILITOT 0.3 06/05/2023 1009       RADIOGRAPHIC STUDIES: MR Lumbar Spine W Wo Contrast  Result Date: 06/13/2023 CLINICAL DATA:  Follow-up metastatic disease EXAM: MRI THORACIC AND LUMBAR SPINE WITHOUT AND WITH CONTRAST TECHNIQUE: Multiplanar and multiecho pulse sequences of the thoracic and lumbar spine were obtained without and with intravenous contrast. CONTRAST:  6.81mL GADAVIST GADOBUTROL 1 MMOL/ML IV SOLN COMPARISON:  03/01/2023 FINDINGS: MRI THORACIC SPINE  FINDINGS Alignment:  Normal Vertebrae: STIR hyperintense metastatic lesions in the T3 and T9 vertebral bodies are non progressed, up to 9 mm at T3. No new lesion or fracture. No evidence of extraosseous tumor. Cord:  Normal Paraspinal and other soft tissues: No paravertebral mass or inflammation Disc levels: No significant degenerative change MRI LUMBAR SPINE FINDINGS Segmentation:  Standard. Alignment:  Unremarkable Vertebrae: L2 metastatic disease with advanced compression fracture and retropulsion causing compressive thecal sac stenosis. Cement augmentation has been performed. Accentuated fatty marrow at L1 and below attributed to radiotherapy. No new lesion is seen. Chronic superior endplate fracture at L4. Hypointense appearance of the L3 posterior body marrow is stable and likely from underlying hemangioma. Conus medullaris: Extends to the L1 level and appears normal. Paraspinal and other soft tissues: No perispinal mass or inflammation. Disc levels: L1-L2: Compressive thecal sac stenosis from retropulsion. Mild bilateral foraminal narrowing. L2-L3: Advanced thecal sac stenosis from retropulsion. Biforaminal impingement from retropulsion, bulging disc, and facet spurring. L3-L4: Bulging disc and degenerative facet spurring. L4-L5: Disc narrowing and bulging with endplate and facet spurring eccentric to the left. Left subarticular recess stenosis that could affect the left L5 nerve root. Moderate left foraminal stenosis. L5-S1:Disc narrowing with endplate ridging. Degenerative facet spurring on both sides. Mild bilateral foraminal narrowing. IMPRESSION: 1. No new or progressive metastatic disease in the thoracic and lumbar spine. 2. Unchanged L2 compression fracture with retropulsion causing thecal sac compression and biforaminal impingement at L2-3. 3. Degenerative narrowings described above. Electronically Signed   By: Tiburcio Pea M.D.   On: 06/13/2023 09:20   MR THORACIC SPINE W WO CONTRAST  Result  Date: 06/13/2023 CLINICAL DATA:  Follow-up metastatic disease EXAM: MRI THORACIC AND LUMBAR SPINE WITHOUT AND WITH CONTRAST TECHNIQUE: Multiplanar and multiecho pulse sequences of the thoracic and lumbar spine were obtained without and with intravenous contrast. CONTRAST:  6.61mL GADAVIST GADOBUTROL 1 MMOL/ML IV SOLN COMPARISON:  03/01/2023 FINDINGS: MRI THORACIC SPINE FINDINGS Alignment:  Normal Vertebrae: STIR hyperintense metastatic lesions in the T3 and T9 vertebral bodies are non progressed, up to 9 mm at T3. No new lesion or fracture. No evidence of extraosseous tumor. Cord:  Normal Paraspinal and other soft tissues: No paravertebral mass or inflammation Disc levels: No significant degenerative change MRI LUMBAR SPINE FINDINGS Segmentation:  Standard. Alignment:  Unremarkable Vertebrae: L2 metastatic disease with advanced compression fracture and retropulsion causing compressive thecal sac stenosis. Cement augmentation has been performed. Accentuated fatty marrow at L1 and below attributed to radiotherapy. No new lesion is seen. Chronic superior endplate fracture at  L4. Hypointense appearance of the L3 posterior body marrow is stable and likely from underlying hemangioma. Conus medullaris: Extends to the L1 level and appears normal. Paraspinal and other soft tissues: No perispinal mass or inflammation. Disc levels: L1-L2: Compressive thecal sac stenosis from retropulsion. Mild bilateral foraminal narrowing. L2-L3: Advanced thecal sac stenosis from retropulsion. Biforaminal impingement from retropulsion, bulging disc, and facet spurring. L3-L4: Bulging disc and degenerative facet spurring. L4-L5: Disc narrowing and bulging with endplate and facet spurring eccentric to the left. Left subarticular recess stenosis that could affect the left L5 nerve root. Moderate left foraminal stenosis. L5-S1:Disc narrowing with endplate ridging. Degenerative facet spurring on both sides. Mild bilateral foraminal narrowing.  IMPRESSION: 1. No new or progressive metastatic disease in the thoracic and lumbar spine. 2. Unchanged L2 compression fracture with retropulsion causing thecal sac compression and biforaminal impingement at L2-3. 3. Degenerative narrowings described above. Electronically Signed   By: Tiburcio Pea M.D.   On: 06/13/2023 09:20   MR Brain W Wo Contrast  Result Date: 06/13/2023 CLINICAL DATA:  Follow-up metastatic disease EXAM: MRI HEAD WITHOUT AND WITH CONTRAST TECHNIQUE: Multiplanar, multiecho pulse sequences of the brain and surrounding structures were obtained without and with intravenous contrast. CONTRAST:  6.74mL GADAVIST GADOBUTROL 1 MMOL/ML IV SOLN COMPARISON:  03/01/2023 FINDINGS: Brain: Punctate right cerebellar enhancement on 2700:106 is unchanged. No new metastasis, edema, or brain swelling. No incidental infarct, hydrocephalus, or collection. Vascular: Major flow voids and vascular enhancements are preserved. Skull and upper cervical spine: No focal marrow lesion. Occipital scalp lipoma which is simple appearing Sinuses/Orbits: Negative IMPRESSION: No new or progressive disease. Single punctate enhancing lesion in the right cerebellum is stable.a Electronically Signed   By: Tiburcio Pea M.D.   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 He has molecular studies by foundation 1 that showed no actionable mutations and PD-L1 expression was 90%. He underwent palliative radiotherapy to the L2 vertebral body compression fraction as well as solitary brain  metastasis with SRS. He is currently undergoing palliative systemic chemotherapy with carboplatin for AUC of 5, Alimta 500 Mg/M2 and Keytruda 200 Mg IV every 3 weeks.  Status post 14 cycles.  Starting from cycle #5 he is on treatment with maintenance Alimta and Keytruda every 3 weeks. The patient has been tolerating this treatment well with no concerning adverse effects. I recommended for him to proceed with cycle #15 today as planned. I will see him back for follow-up visit in 3 weeks for evaluation before the next cycle of his treatment. For the pain management, he is currently on MS Contin 15 mg p.o. twice daily in addition to Oxy IR for breakthrough pain and managed by the palliative care team.  He was advised to call immediately if he has any other concerning symptoms in the interval. The patient voices understanding of current disease status and treatment options and is in agreement with the current care plan.  All questions were answered. The patient knows to call the clinic with any problems, questions or concerns. We can certainly see the patient much sooner if necessary.  The total  time spent in the appointment was 20 minutes.  Disclaimer: This note was dictated with voice recognition software. Similar sounding words can inadvertently be transcribed and may not be corrected upon review.

## 2023-06-26 NOTE — Patient Instructions (Signed)
[facility]  Discharge Instructions: Thank you for choosing [facility] to provide your oncology and hematology care.   If you have a lab appointment with the Cancer Center, please go directly to the Cancer Center and check in at the registration area.   Wear comfortable clothing and clothing appropriate for easy access to any Portacath or PICC line.   We strive to give you quality time with your provider. You may need to reschedule your appointment if you arrive late (15 or more minutes).  Arriving late affects you and other patients whose appointments are after yours.  Also, if you miss three or more appointments without notifying the office, you may be dismissed from the clinic at the provider's discretion.      For prescription refill requests, have your pharmacy contact our office and allow 72 hours for refills to be completed.    Today you received the following chemotherapy and/or immunotherapy agents: Keytruda/Alimta      To help prevent nausea and vomiting after your treatment, we encourage you to take your nausea medication as directed.  BELOW ARE SYMPTOMS THAT SHOULD BE REPORTED IMMEDIATELY: *FEVER GREATER THAN 100.4 F (38 C) OR HIGHER *CHILLS OR SWEATING *NAUSEA AND VOMITING THAT IS NOT CONTROLLED WITH YOUR NAUSEA MEDICATION *UNUSUAL SHORTNESS OF BREATH *UNUSUAL BRUISING OR BLEEDING *URINARY PROBLEMS (pain or burning when urinating, or frequent urination) *BOWEL PROBLEMS (unusual diarrhea, constipation, pain near the anus) TENDERNESS IN MOUTH AND THROAT WITH OR WITHOUT PRESENCE OF ULCERS (sore throat, sores in mouth, or a toothache) UNUSUAL RASH, SWELLING OR PAIN  UNUSUAL VAGINAL DISCHARGE OR ITCHING   Items with * indicate a potential emergency and should be followed up as soon as possible or go to the Emergency Department if any problems should occur.  Please show the CHEMOTHERAPY ALERT CARD or IMMUNOTHERAPY ALERT CARD at  check-in to the Emergency Department and triage nurse.  Should you have questions after your visit or need to cancel or reschedule your appointment, please contact [facility]  Dept: [phone]  and follow the prompts.  Office hours are 8:00 a.m. to 4:30 p.m. Monday - Friday. Please note that voicemails left after 4:00 p.m. may not be returned until the following business day.  We are closed weekends and major holidays. You have access to a nurse at all times for urgent questions. Please call the main number to the clinic Dept: [phone] and follow the prompts.   For any non-urgent questions, you may also contact your provider using MyChart. We now offer e-Visits for anyone 18 and older to request care online for non-urgent symptoms. For details visit [email or web address].   Also download the MyChart app! Go to the app store, search "MyChart", open the app, select , and log in with your MyChart username and password.   

## 2023-06-27 DIAGNOSIS — E113311 Type 2 diabetes mellitus with moderate nonproliferative diabetic retinopathy with macular edema, right eye: Secondary | ICD-10-CM | POA: Insufficient documentation

## 2023-06-27 DIAGNOSIS — H2513 Age-related nuclear cataract, bilateral: Secondary | ICD-10-CM | POA: Insufficient documentation

## 2023-06-28 ENCOUNTER — Other Ambulatory Visit (HOSPITAL_COMMUNITY): Payer: Self-pay

## 2023-06-29 ENCOUNTER — Ambulatory Visit: Payer: Medicaid Other | Admitting: Physician Assistant

## 2023-07-01 ENCOUNTER — Telehealth: Payer: Self-pay | Admitting: Internal Medicine

## 2023-07-01 NOTE — Telephone Encounter (Signed)
Called patient regarding upcoming August/October appointments, patient is notified.

## 2023-07-11 ENCOUNTER — Other Ambulatory Visit: Payer: Self-pay | Admitting: Nurse Practitioner

## 2023-07-11 ENCOUNTER — Other Ambulatory Visit (HOSPITAL_COMMUNITY): Payer: Self-pay

## 2023-07-11 DIAGNOSIS — Z515 Encounter for palliative care: Secondary | ICD-10-CM

## 2023-07-11 DIAGNOSIS — M792 Neuralgia and neuritis, unspecified: Secondary | ICD-10-CM

## 2023-07-11 DIAGNOSIS — C7951 Secondary malignant neoplasm of bone: Secondary | ICD-10-CM

## 2023-07-11 DIAGNOSIS — C3490 Malignant neoplasm of unspecified part of unspecified bronchus or lung: Secondary | ICD-10-CM

## 2023-07-11 DIAGNOSIS — G893 Neoplasm related pain (acute) (chronic): Secondary | ICD-10-CM

## 2023-07-11 MED ORDER — TIZANIDINE HCL 2 MG PO TABS
2.0000 mg | ORAL_TABLET | Freq: Every day | ORAL | 1 refills | Status: DC
Start: 2023-07-11 — End: 2023-11-06
  Filled 2023-07-11 – 2023-10-12 (×2): qty 30, 30d supply, fill #0

## 2023-07-11 MED ORDER — GABAPENTIN 300 MG PO CAPS
600.0000 mg | ORAL_CAPSULE | Freq: Three times a day (TID) | ORAL | 1 refills | Status: DC
Start: 2023-07-11 — End: 2023-07-12
  Filled 2023-07-11: qty 180, 30d supply, fill #0

## 2023-07-11 MED ORDER — OXYCODONE HCL 10 MG PO TABS
10.0000 mg | ORAL_TABLET | Freq: Four times a day (QID) | ORAL | 0 refills | Status: DC | PRN
Start: 2023-07-11 — End: 2023-08-01
  Filled 2023-07-11: qty 60, 15d supply, fill #0

## 2023-07-12 ENCOUNTER — Other Ambulatory Visit: Payer: Self-pay

## 2023-07-12 ENCOUNTER — Encounter: Payer: Self-pay | Admitting: Internal Medicine

## 2023-07-12 ENCOUNTER — Ambulatory Visit: Payer: Medicaid Other | Attending: Critical Care Medicine | Admitting: Critical Care Medicine

## 2023-07-12 ENCOUNTER — Other Ambulatory Visit (HOSPITAL_COMMUNITY): Payer: Self-pay

## 2023-07-12 ENCOUNTER — Encounter: Payer: Self-pay | Admitting: Critical Care Medicine

## 2023-07-12 VITALS — BP 137/88 | HR 86 | Wt 151.0 lb

## 2023-07-12 DIAGNOSIS — Z794 Long term (current) use of insulin: Secondary | ICD-10-CM

## 2023-07-12 DIAGNOSIS — Z7984 Long term (current) use of oral hypoglycemic drugs: Secondary | ICD-10-CM | POA: Diagnosis not present

## 2023-07-12 DIAGNOSIS — R590 Localized enlarged lymph nodes: Secondary | ICD-10-CM | POA: Diagnosis not present

## 2023-07-12 DIAGNOSIS — F17210 Nicotine dependence, cigarettes, uncomplicated: Secondary | ICD-10-CM | POA: Insufficient documentation

## 2023-07-12 DIAGNOSIS — E1142 Type 2 diabetes mellitus with diabetic polyneuropathy: Secondary | ICD-10-CM | POA: Diagnosis not present

## 2023-07-12 DIAGNOSIS — R413 Other amnesia: Secondary | ICD-10-CM

## 2023-07-12 DIAGNOSIS — F17200 Nicotine dependence, unspecified, uncomplicated: Secondary | ICD-10-CM

## 2023-07-12 DIAGNOSIS — C3490 Malignant neoplasm of unspecified part of unspecified bronchus or lung: Secondary | ICD-10-CM | POA: Diagnosis present

## 2023-07-12 DIAGNOSIS — G893 Neoplasm related pain (acute) (chronic): Secondary | ICD-10-CM

## 2023-07-12 DIAGNOSIS — C7951 Secondary malignant neoplasm of bone: Secondary | ICD-10-CM

## 2023-07-12 DIAGNOSIS — Z79891 Long term (current) use of opiate analgesic: Secondary | ICD-10-CM | POA: Diagnosis not present

## 2023-07-12 DIAGNOSIS — E113599 Type 2 diabetes mellitus with proliferative diabetic retinopathy without macular edema, unspecified eye: Secondary | ICD-10-CM | POA: Diagnosis not present

## 2023-07-12 DIAGNOSIS — E119 Type 2 diabetes mellitus without complications: Secondary | ICD-10-CM

## 2023-07-12 DIAGNOSIS — M792 Neuralgia and neuritis, unspecified: Secondary | ICD-10-CM

## 2023-07-12 DIAGNOSIS — J90 Pleural effusion, not elsewhere classified: Secondary | ICD-10-CM | POA: Diagnosis not present

## 2023-07-12 DIAGNOSIS — E113553 Type 2 diabetes mellitus with stable proliferative diabetic retinopathy, bilateral: Secondary | ICD-10-CM

## 2023-07-12 DIAGNOSIS — Z79899 Other long term (current) drug therapy: Secondary | ICD-10-CM | POA: Insufficient documentation

## 2023-07-12 DIAGNOSIS — Z515 Encounter for palliative care: Secondary | ICD-10-CM

## 2023-07-12 LAB — POCT GLYCOSYLATED HEMOGLOBIN (HGB A1C): HbA1c, POC (controlled diabetic range): 10.9 % — AB (ref 0.0–7.0)

## 2023-07-12 LAB — GLUCOSE, POCT (MANUAL RESULT ENTRY): POC Glucose: 342 mg/dl — AB (ref 70–99)

## 2023-07-12 MED ORDER — ATORVASTATIN CALCIUM 40 MG PO TABS
40.0000 mg | ORAL_TABLET | Freq: Every day | ORAL | 1 refills | Status: DC
  Filled 2023-07-12 – 2023-10-12 (×2): qty 90, 90d supply, fill #0

## 2023-07-12 MED ORDER — SILDENAFIL CITRATE 100 MG PO TABS
50.0000 mg | ORAL_TABLET | Freq: Every day | ORAL | 11 refills | Status: DC | PRN
  Filled 2023-07-12: qty 5, 5d supply, fill #0

## 2023-07-12 MED ORDER — EMPAGLIFLOZIN 25 MG PO TABS
25.0000 mg | ORAL_TABLET | Freq: Every day | ORAL | 1 refills | Status: DC
  Filled 2023-07-12 – 2023-10-12 (×2): qty 90, 90d supply, fill #0

## 2023-07-12 MED ORDER — LANTUS SOLOSTAR 100 UNIT/ML ~~LOC~~ SOPN
50.0000 [IU] | PEN_INJECTOR | Freq: Every day | SUBCUTANEOUS | 11 refills | Status: DC
  Filled 2023-07-12: qty 15, 30d supply, fill #0
  Filled 2023-09-12: qty 15, 30d supply, fill #1
  Filled 2023-10-12: qty 15, 30d supply, fill #0

## 2023-07-12 MED ORDER — GABAPENTIN 300 MG PO CAPS
600.0000 mg | ORAL_CAPSULE | Freq: Three times a day (TID) | ORAL | 1 refills | Status: DC
  Filled 2023-07-12: qty 180, 30d supply, fill #0
  Filled 2023-09-12: qty 180, 30d supply, fill #1

## 2023-07-12 MED ORDER — INSULIN PEN NEEDLE 32G X 4 MM MISC
3 refills | Status: AC
  Filled 2023-07-12: qty 100, 30d supply, fill #0
  Filled 2023-10-12: qty 100, 100d supply, fill #0

## 2023-07-12 MED ORDER — BUPROPION HCL ER (XL) 150 MG PO TB24
150.0000 mg | ORAL_TABLET | Freq: Every day | ORAL | 1 refills | Status: DC
  Filled 2023-07-12 – 2023-10-12 (×2): qty 90, 90d supply, fill #0

## 2023-07-12 MED ORDER — IPRATROPIUM-ALBUTEROL 0.5-2.5 (3) MG/3ML IN SOLN
3.0000 mL | Freq: Four times a day (QID) | RESPIRATORY_TRACT | 0 refills | Status: DC | PRN
  Filled 2023-07-12: qty 360, 30d supply, fill #0

## 2023-07-12 NOTE — Assessment & Plan Note (Signed)
Continue gabapentin.

## 2023-07-12 NOTE — Patient Instructions (Signed)
Start wellbutrin daily for smoking cessation Discuss mental acuity function with palliative team and ask for counselor to stop smoking All other non cancer medication refilled: increase lantus to 50units daily, increase jardiance to 25 mg daily  See Franky Macho our pharmacist in 4 weeks for diabetes Return primary care 3 months

## 2023-07-12 NOTE — Assessment & Plan Note (Signed)
Has retinal follow-up

## 2023-07-12 NOTE — Assessment & Plan Note (Signed)
Patient complains of lack of focus and memory changes we will ask him to discuss this with palliative medicine at the cancer clinic it may be related to his opiates

## 2023-07-12 NOTE — Progress Notes (Signed)
The   New Patient Office Visit  Subjective    Patient ID: Samuel [name], male    DOB: 25-Feb-1966  Age: 57 y.o. MRN: [id number]  CC:  Chief Complaint  Patient presents with   Diabetes   Medication Refill    HPI 01/19/23 Doris Cheadle Iglesia [name] presents to establish care This is a 57 year old male who has a history of adenocarcinoma of the lung undergoing treatment as documented below by the cancer center.  He is also seen by palliative medicine for pain control.  He had has had mets to the brain and the thoracolumbar spine.  P patient also has type 2 diabetes and is on an insulin mix 7030.  He is only on 10 units twice daily and on arrival A1c is quite elevated.  He states his blood sugars run anywhere from 2 50-300.  At the oncology center it was over 500 recently.  He cannot tolerate metformin.  He is on no other medications for his diabetes.  He cannot tolerate GLP's.  Patient's oncologic status is slowly getting under control and he is still on cyclic chemotherapy.  He underwent radiation to the brain and spine already.  Last CT of the chest showed stable disease.  Abdominal CT did not show abdominal or liver involvement.      04/11/23 This patient seen in return follow-up has stage IV lung cancer in the thoracic spine mets recent CT scan showed no other disease process in other parts of the body.  Patient still smoking half pack a day.  He is receiving pain medication.  He still has significant low back pain.  He also has type 2 diabetes A1c remains elevated he had insulin started 40 units a day long-acting continue with Jardiance. Oncology is planning future chemotherapy agents  07/12/23 This patient is seen in return follow-up as non-small cell lung cancer adenocarcinoma presenting with right lower lobe pulmonary nodules including right hilar mediastinal tumor pleural effusion brain metastases and extensive bone metastases in the lumbar spine.  This patient is received multiple rounds of  chemotherapy and has seen significant response and stability and disease he just finished his 15th round.  He is being seen by the palliative care team is on chronic opiates for bone involvement.  Patient also has type 2 diabetes.  His blood sugars have been running higher in the 300 range and A1c is quite elevated at this visit.  He is on long-acting insulin and Jardiance at this time.  Blood pressure on arrival is good 137/88.  The patient does complain of inability to focus at times and complete task this is a new concern of his.  Patient also still smoking cigarettes and needs to quit has failed nicotine replacement.  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   Biomarker Findings Microsatellite status - MS-Stable Tumor Mutational Burden - 8 Muts/Mb Genomic Findings For a complete list of the genes assayed, please refer to the Appendix. KEAP1 Q227* CDKN2A/B [id number] G89V PRKCI amplification TERC amplification - equivocal? TP53 [id number]*18 8 Disease relevant genes  with no reportable alterations: ALK, BRAF, EGFR, ERBB2, KRAS, MET, RET, ROS1   PDL1 TPS  90%   PRIOR THERAPY:  1) Status post palliative radiotherapy to the L2 lesion under the care of Dr. Kathrynn Running. 2) Status post SRS to solitary brain metastasis under the care of Dr. Kathrynn Running   CURRENT THERAPY: Palliative systemic therapy with carboplatin for AUC of 5, Alimta 500 Mg/M2 and Keytruda 200 Mg IV every 3 weeks.  First dose September 05, 2022.  Status post 14  cycles.  Starting from cycle #5 the patient will be on maintenance treatment with Alimta and Keytruda every 3 weeks.   INTERVAL HISTORY: Samuel [name] 57 y.o. male returns to the clinic today for follow-up visit.  The patient is feeling fine today with no concerning complaints.  He denied having any current chest pain, shortness of breath, cough or hemoptysis.  He has no nausea, vomiting, diarrhea or constipation.  He has no headache or visual changes.  He denied having any recent weight loss or night sweats.  He is tolerating his systemic chemotherapy fairly well.  The patient is here today for evaluation before starting cycle #15.   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 He has molecular studies by foundation 1 that showed no actionable mutations and PD-L1 expression was 90%. He underwent palliative radiotherapy to the L2 vertebral body compression fraction as well as solitary brain metastasis with SRS. He is currently undergoing palliative systemic chemotherapy with carboplatin for AUC of 5, Alimta 500 Mg/M2 and Keytruda 200 Mg IV every 3 weeks.  Status post 14 cycles.  Starting from cycle #5 he is on treatment with maintenance Alimta and Keytruda every 3 weeks. The patient has been tolerating this treatment well with no concerning adverse effects. I recommended for him to proceed with cycle #15 today as planned. I will see him back for follow-up visit in 3 weeks for evaluation before the next cycle of his treatment. For the pain management, he is currently on MS Contin 15 mg p.o. twice daily in  addition to Oxy IR for breakthrough pain and managed by the palliative care team.  He was advised to call immediately if he has any other concerning symptoms in the interval. The patient voices understanding of current disease status and treatment options and is in agreement with the current care plan.   Outpatient Encounter Medications as of 07/12/2023  Medication Sig   Accu-Chek Softclix Lancets lancets Use to check blood sugar 3 times daily. E11.69   benzonatate (TESSALON) 100 MG capsule Take 1 capsule (100 mg total) by mouth 2 (two) times daily as needed. For cough   blood glucose meter kit and supplies KIT Dispense based on patient and insurance preference. Use up to four times daily as directed.   Blood Glucose Monitoring Suppl (ACCU-CHEK GUIDE) w/Device KIT Use to check blood sugar 3 times daily. E11.69   buPROPion (WELLBUTRIN XL) 150 MG 24 hr tablet Take 1 tablet (150 mg total) by mouth daily.   folic acid (FOLVITE) 1 MG tablet Take 1 tablet (1 mg total) by mouth daily.   lidocaine-prilocaine (EMLA) cream Apply to  the Port-A-Cath site 30-60 minutes before chemo.   Oxycodone HCl 10 MG TABS Take 1 tablet (10 mg) by mouth every 6 hours as needed for severe pain.   prochlorperazine (COMPAZINE) 10 MG tablet Take 1 tablet (10 mg total) by mouth every 6 (six) hours as needed for nausea or vomiting.   tiZANidine (ZANAFLEX) 2 MG tablet Take 1 tablet (2 mg) by mouth at bedtime.   [DISCONTINUED] atorvastatin (LIPITOR) 40 MG tablet Take 1 tablet (40 mg total) by mouth daily.   [DISCONTINUED] insulin glargine (LANTUS SOLOSTAR) 100 UNIT/ML Solostar Pen Inject 40 Units into the skin daily.   [DISCONTINUED] Insulin Pen Needle 32G X 4 MM MISC Use with Insulin pen   [DISCONTINUED] ipratropium-albuterol (DUONEB) 0.5-2.5 (3) MG/3ML SOLN Take 3 mLs by nebulization every 6 (six) hours as needed (Shortness of breath).   [DISCONTINUED] sildenafil (VIAGRA) 100 MG tablet Take 1/2 - 1 tablet by mouth daily as needed  for erectile dysfunction.   [DISCONTINUED] sildenafil (VIAGRA) 100 MG tablet Take 1/2 - 1 tablet by mouth daily as needed for erectile dysfunction.   atorvastatin (LIPITOR) 40 MG tablet Take 1 tablet (40 mg total) by mouth daily.   empagliflozin (JARDIANCE) 25 MG TABS tablet Take 1 tablet (25 mg total) by mouth daily before breakfast.   gabapentin (NEURONTIN) 300 MG capsule Take 2 capsules (600 mg total) by mouth 3 (three) times daily.   glucose blood (ACCU-CHEK GUIDE) test strip Use to check blood sugar 3 times daily. E11.69   insulin glargine (LANTUS SOLOSTAR) 100 UNIT/ML Solostar Pen Inject 50 Units into the skin daily.   Insulin Pen Needle 32G X 4 MM MISC Use with Insulin pen   ipratropium-albuterol (DUONEB) 0.5-2.5 (3) MG/3ML SOLN Take 3 mLs by nebulization every 6 (six) hours as needed (Shortness of breath).   sildenafil (VIAGRA) 100 MG tablet Take 1/2 - 1 tablet by mouth daily as needed for erectile dysfunction.   [DISCONTINUED] empagliflozin (JARDIANCE) 10 MG TABS tablet Take 1 tablet (10 mg total) by mouth daily before breakfast.   [DISCONTINUED] gabapentin (NEURONTIN) 300 MG capsule Take 2 capsules (600 mg total) by mouth 3 (three) times daily.   [DISCONTINUED] nicotine polacrilex (NICORETTE MINI) 4 MG lozenge Use 3 times a day as directed to quit smoking   No facility-administered encounter medications on file as of 07/12/2023.    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 06/13/2022 A1C 13.8, FSBG 414 Start empagliflozin 5mg /metformin  1000 daily, levemir 20 qhs. (Samples given) Will apply for medassist for pharmacy    Past Surgical History:  Procedure Laterality Date   IR BONE TUMOR(S)RF ABLATION  08/01/2022   IR IMAGING GUIDED PORT INSERTION  09/21/2022   IR KYPHO LUMBAR INC FX REDUCE BONE BX UNI/BIL CANNULATION INC/IMAGING  08/01/2022    No family history on file.  Social History   Socioeconomic History   Marital status: Single    Spouse name: Not on file   Number of children: Not on file   Years of education: Not on file   Highest education level:  Associate degree: academic program  Occupational History   Not on file  Tobacco Use   Smoking status: Every Day    Current packs/day: 0.00    Types: Cigarettes    Last attempt to quit: 08/17/2022    Years since quitting: 0.9   Smokeless tobacco: Never  Vaping Use   Vaping status: Never Used  Substance and Sexual Activity   Alcohol use: Yes    Alcohol/week: 50.0 standard drinks of alcohol    Types: 50 Cans of beer per week    Comment: last thursday - a couple of beers   Drug use: No   Sexual activity: Yes  Other Topics Concern   Not on file  Social History Narrative   Not on file   Social Determinants of Health   Financial Resource Strain: Medium Risk (03/17/2023)   Overall Financial Resource Strain (CARDIA)    Difficulty of Paying Living Expenses: Somewhat hard  Food Insecurity: Food Insecurity Present (03/17/2023)   Hunger Vital Sign    Worried About Running Out of Food in the Last Year: Sometimes true    Ran Out of Food in the Last Year: Never true  Transportation Needs: No Transportation Needs (03/17/2023)   PRAPARE - Administrator, Civil Service (Medical): No    Lack of Transportation (Non-Medical): No  Physical Activity: Insufficiently Active (03/17/2023)   Exercise Vital Sign    Days of Exercise per Week: 2 days    Minutes of Exercise per Session: 20 min  Stress: Stress Concern Present (03/17/2023)   [employer] of  Occupational Health - Occupational Stress Questionnaire    Feeling of Stress : To some extent  Social Connections: Unknown (05/09/2023)   Received from [facility], Henriette Combs   Social Connections    How often do you feel lonely or isolated from those around you? (Adult - for ages 102 years and over): Not on file  Recent Concern: Social Connections - Moderately Isolated (03/17/2023)   Social Connection and Isolation Panel [NHANES]    Frequency of Communication with Friends and Family: More than three times a week    Frequency of Social Gatherings with Friends and Family: More than three times a week    Attends Religious Services: More than 4 times per year    Active Member of [employer] or Organizations: No    Attends Engineer, structural: Not on file    Marital Status: Divorced  Intimate Partner Violence: Not At Risk (03/08/2023)   Humiliation, Afraid, Rape, and Kick questionnaire    Fear of Current or Ex-Partner: No    Emotionally Abused: No    Physically Abused: No    Sexually Abused: No    Review of Systems  Constitutional:  Negative for chills, diaphoresis, fever, malaise/fatigue and weight loss.  HENT:  Negative for congestion, hearing loss, nosebleeds, sore throat and tinnitus.   Eyes:  Negative for blurred vision, photophobia and redness.  Respiratory:  Negative for cough, hemoptysis, sputum production, shortness of breath, wheezing and stridor.   Cardiovascular:  Negative for chest pain, palpitations, orthopnea, claudication, leg swelling and PND.  Gastrointestinal:  Negative for abdominal pain, blood in stool, constipation, diarrhea, heartburn, nausea and vomiting.  Genitourinary:  Negative for dysuria, flank pain, frequency, hematuria and urgency.  Musculoskeletal:  Positive for back pain and neck pain. Negative for falls, joint pain and myalgias.  Skin:  Negative for itching and rash.  Neurological:  Positive for sensory change. Negative for dizziness, tingling, tremors,  speech change,  focal weakness, seizures, loss of consciousness, weakness and headaches.       Lack of focus  Endo/Heme/Allergies:  Negative for environmental allergies and polydipsia. Does not bruise/bleed easily.  Psychiatric/Behavioral:  Negative for depression, memory loss, substance abuse and suicidal ideas. The patient is not nervous/anxious and does not have insomnia.         Objective    BP 137/88 (BP Location: Right Arm, Patient Position: Sitting, Cuff Size: Normal)   Pulse 86   Wt 151 lb (68.5 kg)   SpO2 100%   BMI 22.96 kg/m   Physical Exam Vitals reviewed.  Constitutional:      Appearance: Normal appearance. He is well-developed. He is not diaphoretic.  HENT:     Head: Normocephalic and atraumatic.     Nose: No nasal deformity, septal deviation, mucosal edema or rhinorrhea.     Right Sinus: No maxillary sinus tenderness or frontal sinus tenderness.     Left Sinus: No maxillary sinus tenderness or frontal sinus tenderness.     Mouth/Throat:     Pharynx: No oropharyngeal exudate.     Comments: Poor dentition Eyes:     General: No scleral icterus.    Conjunctiva/sclera: Conjunctivae normal.     Pupils: Pupils are equal, round, and reactive to light.  Neck:     Thyroid: No thyromegaly.     Vascular: No carotid bruit or JVD.     Trachea: Trachea normal. No tracheal tenderness or tracheal deviation.  Cardiovascular:     Rate and Rhythm: Normal rate and regular rhythm.     Chest Wall: PMI is not displaced.     Pulses: Normal pulses. No decreased pulses.     Heart sounds: Normal heart sounds, S1 normal and S2 normal. Heart sounds not distant. No murmur heard.    No systolic murmur is present.     No diastolic murmur is present.     No friction rub. No gallop. No S3 or S4 sounds.  Pulmonary:     Effort: No tachypnea, accessory muscle usage or respiratory distress.     Breath sounds: No stridor. No decreased breath sounds, wheezing, rhonchi or rales.  Chest:      Chest wall: No tenderness.  Abdominal:     General: Bowel sounds are normal. There is no distension.     Palpations: Abdomen is soft. Abdomen is not rigid.     Tenderness: There is no abdominal tenderness. There is no guarding or rebound.  Musculoskeletal:        General: Tenderness present. Normal range of motion.     Cervical back: Normal range of motion and neck supple. No edema, erythema or rigidity. No muscular tenderness. Normal range of motion.     Right lower leg: Edema present.     Left lower leg: Edema present.  Lymphadenopathy:     Head:     Right side of head: No submental or submandibular adenopathy.     Left side of head: No submental or submandibular adenopathy.     Cervical: No cervical adenopathy.  Skin:    General: Skin is warm and dry.     Coloration: Skin is not pale.     Findings: No rash.     Nails: There is no clubbing.  Neurological:     Mental Status: He is alert and oriented to person, place, and time.     Sensory: No sensory deficit.  Psychiatric:        Speech: Speech normal.  Behavior: Behavior normal.         Assessment & Plan:   Problem List Items Addressed This Visit       Respiratory   Primary lung adenocarcinoma (HCC)    Management per oncology      Relevant Medications   gabapentin (NEURONTIN) 300 MG capsule     Endocrine   Diabetes (HCC)    Uncontrolled at this time will increase Lantus to 50 units daily and increase Jardiance 25 mg daily get patient into clinical pharmacy for follow-up      Relevant Medications   empagliflozin (JARDIANCE) 25 MG TABS tablet   insulin glargine (LANTUS SOLOSTAR) 100 UNIT/ML Solostar Pen   atorvastatin (LIPITOR) 40 MG tablet   DM type 2 with diabetic peripheral neuropathy (HCC) - Primary    Continue gabapentin      Relevant Medications   buPROPion (WELLBUTRIN XL) 150 MG 24 hr tablet   empagliflozin (JARDIANCE) 25 MG TABS tablet   insulin glargine (LANTUS SOLOSTAR) 100 UNIT/ML Solostar  Pen   atorvastatin (LIPITOR) 40 MG tablet   gabapentin (NEURONTIN) 300 MG capsule   Other Relevant Orders   POCT glycosylated hemoglobin (Hb A1C) (Completed)   POCT glucose (manual entry) (Completed)   Lipid panel   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)    Has retinal follow-up      Relevant Medications   empagliflozin (JARDIANCE) 25 MG TABS tablet   insulin glargine (LANTUS SOLOSTAR) 100 UNIT/ML Solostar Pen   atorvastatin (LIPITOR) 40 MG tablet     Musculoskeletal and Integument   Metastatic cancer to spine (HCC)   Relevant Medications   gabapentin (NEURONTIN) 300 MG capsule     Other   Tobacco dependence    Still smoking    Current smoking consumption amount: Half a pack a day  Dicsussion on advise to quit smoking and smoking impacts: Cardiovascular lung cancer impacts  Patient's willingness to quit: Motivated to quit  Methods to quit smoking discussed: Behavioral modification  Medication management of smoking session drugs failed nicotine will give trial of Wellbutrin  Resources provided:  AVS   Setting quit date not established  Follow-up arranged 2 months   Time spent counseling the patient: 5 minutes       Neuropathic pain   Relevant Medications   gabapentin (NEURONTIN) 300 MG capsule   Palliative care patient   Relevant Medications   gabapentin (NEURONTIN) 300 MG capsule   Neoplasm related pain   Relevant Medications   gabapentin (NEURONTIN) 300 MG capsule   Memory change    Patient complains of lack of focus and memory changes we will ask him to discuss this with palliative medicine at the cancer clinic it may be related to his opiates      30 spent prolonged time due to multiple records review's high degree of complexity Referral sent to optometry for eye exam Return in about 3 months (around 10/12/2023) for primary care follow up, diabetes.   Shan Levans, MD

## 2023-07-12 NOTE — Assessment & Plan Note (Signed)
Management per oncology.

## 2023-07-12 NOTE — Assessment & Plan Note (Signed)
Uncontrolled at this time will increase Lantus to 50 units daily and increase Jardiance 25 mg daily get patient into clinical pharmacy for follow-up

## 2023-07-12 NOTE — Assessment & Plan Note (Signed)
Still smoking    Current smoking consumption amount: Half a pack a day  Dicsussion on advise to quit smoking and smoking impacts: Cardiovascular lung cancer impacts  Patient's willingness to quit: Motivated to quit  Methods to quit smoking discussed: Behavioral modification  Medication management of smoking session drugs failed nicotine will give trial of Wellbutrin  Resources provided:  AVS   Setting quit date not established  Follow-up arranged 2 months   Time spent counseling the patient: 5 minutes

## 2023-07-13 ENCOUNTER — Telehealth: Payer: Self-pay

## 2023-07-13 ENCOUNTER — Other Ambulatory Visit (HOSPITAL_COMMUNITY): Payer: Self-pay

## 2023-07-13 ENCOUNTER — Other Ambulatory Visit: Payer: Self-pay

## 2023-07-13 LAB — LIPID PANEL
Chol/HDL Ratio: 3.2 ratio (ref 0.0–5.0)
Cholesterol, Total: 133 mg/dL (ref 100–199)
HDL: 41 mg/dL (ref >39)
LDL Chol Calc (NIH): 79 mg/dL (ref 0–99)
Triglycerides: 64 mg/dL (ref 0–149)
VLDL Cholesterol Cal: 13 mg/dL (ref 5–40)

## 2023-07-13 MED ORDER — SILDENAFIL CITRATE 100 MG PO TABS
50.0000 mg | ORAL_TABLET | Freq: Every day | ORAL | 11 refills | Status: DC | PRN
Start: 2023-07-13 — End: 2023-11-02
  Filled 2023-08-01: qty 15, 15d supply, fill #0
  Filled 2023-08-21: qty 15, 15d supply, fill #1
  Filled 2023-10-10: qty 15, 15d supply, fill #2

## 2023-07-13 NOTE — Telephone Encounter (Signed)
Viagra count changed to 15

## 2023-07-13 NOTE — Telephone Encounter (Signed)
Called patient unable to give results at this time will call back

## 2023-07-13 NOTE — Progress Notes (Signed)
Let pt know cholesterol is normal stay on cholesterol pill

## 2023-07-13 NOTE — Telephone Encounter (Signed)
-----   Message from Shan Levans sent at 07/13/2023  6:04 AM EDT ----- Let pt know cholesterol is normal stay on cholesterol pill

## 2023-07-13 NOTE — Telephone Encounter (Signed)
Pt was called and is aware of results, DOB was confirmed.  ?

## 2023-07-13 NOTE — Addendum Note (Signed)
Addended by: [name] on: 07/13/2023 11:56 AM   Modules accepted: Orders

## 2023-07-13 NOTE — Telephone Encounter (Signed)
Called patient and he is aware. 

## 2023-07-14 ENCOUNTER — Other Ambulatory Visit: Payer: Self-pay | Admitting: Critical Care Medicine

## 2023-07-14 ENCOUNTER — Other Ambulatory Visit (HOSPITAL_COMMUNITY): Payer: Self-pay

## 2023-07-15 ENCOUNTER — Other Ambulatory Visit (HOSPITAL_COMMUNITY): Payer: Self-pay

## 2023-07-17 ENCOUNTER — Other Ambulatory Visit: Payer: Medicaid Other

## 2023-07-17 ENCOUNTER — Inpatient Hospital Stay: Payer: Medicaid Other

## 2023-07-17 ENCOUNTER — Inpatient Hospital Stay (HOSPITAL_BASED_OUTPATIENT_CLINIC_OR_DEPARTMENT_OTHER): Payer: Medicaid Other | Admitting: Internal Medicine

## 2023-07-17 ENCOUNTER — Encounter: Payer: Self-pay | Admitting: Nurse Practitioner

## 2023-07-17 ENCOUNTER — Inpatient Hospital Stay (HOSPITAL_BASED_OUTPATIENT_CLINIC_OR_DEPARTMENT_OTHER): Payer: Medicaid Other | Admitting: Nurse Practitioner

## 2023-07-17 VITALS — BP 132/77 | HR 88 | Temp 98.5°F | Resp 17

## 2023-07-17 VITALS — BP 136/81 | HR 85 | Resp 16

## 2023-07-17 DIAGNOSIS — R530 Neoplastic (malignant) related fatigue: Secondary | ICD-10-CM | POA: Diagnosis not present

## 2023-07-17 DIAGNOSIS — C3490 Malignant neoplasm of unspecified part of unspecified bronchus or lung: Secondary | ICD-10-CM

## 2023-07-17 DIAGNOSIS — G893 Neoplasm related pain (acute) (chronic): Secondary | ICD-10-CM

## 2023-07-17 DIAGNOSIS — Z515 Encounter for palliative care: Secondary | ICD-10-CM | POA: Diagnosis not present

## 2023-07-17 DIAGNOSIS — C7931 Secondary malignant neoplasm of brain: Secondary | ICD-10-CM

## 2023-07-17 DIAGNOSIS — Z5111 Encounter for antineoplastic chemotherapy: Secondary | ICD-10-CM | POA: Diagnosis not present

## 2023-07-17 LAB — CBC WITH DIFFERENTIAL (CANCER CENTER ONLY)
Abs Immature Granulocytes: 0.03 10*3/uL (ref 0.00–0.07)
Basophils Absolute: 0 10*3/uL (ref 0.0–0.1)
Basophils Relative: 0 %
Eosinophils Absolute: 0.1 10*3/uL (ref 0.0–0.5)
Eosinophils Relative: 1 %
HCT: 38.5 % — ABNORMAL LOW (ref 39.0–52.0)
Hemoglobin: 13.1 g/dL (ref 13.0–17.0)
Immature Granulocytes: 1 %
Lymphocytes Relative: 12 %
Lymphs Abs: 0.6 10*3/uL — ABNORMAL LOW (ref 0.7–4.0)
MCH: 33.9 pg (ref 26.0–34.0)
MCHC: 34 g/dL (ref 30.0–36.0)
MCV: 99.5 fL (ref 80.0–100.0)
Monocytes Absolute: 0.7 10*3/uL (ref 0.1–1.0)
Monocytes Relative: 14 %
Neutro Abs: 3.9 10*3/uL (ref 1.7–7.7)
Neutrophils Relative %: 72 %
Platelet Count: 202 10*3/uL (ref 150–400)
RBC: 3.87 MIL/uL — ABNORMAL LOW (ref 4.22–5.81)
RDW: 13.1 % (ref 11.5–15.5)
WBC Count: 5.3 10*3/uL (ref 4.0–10.5)
nRBC: 0 % (ref 0.0–0.2)

## 2023-07-17 LAB — CMP (CANCER CENTER ONLY)
ALT: 33 U/L (ref 0–44)
AST: 48 U/L — ABNORMAL HIGH (ref 15–41)
Albumin: 3.6 g/dL (ref 3.5–5.0)
Alkaline Phosphatase: 65 U/L (ref 38–126)
Anion gap: 4 — ABNORMAL LOW (ref 5–15)
BUN: 14 mg/dL (ref 6–20)
CO2: 28 mmol/L (ref 22–32)
Calcium: 9.1 mg/dL (ref 8.9–10.3)
Chloride: 106 mmol/L (ref 98–111)
Creatinine: 0.61 mg/dL (ref 0.61–1.24)
GFR, Estimated: >60 mL/min (ref >60)
Glucose, Bld: 176 mg/dL — ABNORMAL HIGH (ref 70–99)
Potassium: 4.3 mmol/L (ref 3.5–5.1)
Sodium: 138 mmol/L (ref 135–145)
Total Bilirubin: 0.3 mg/dL (ref 0.3–1.2)
Total Protein: 6.9 g/dL (ref 6.5–8.1)

## 2023-07-17 MED ORDER — SODIUM CHLORIDE 0.9 % IV SOLN
500.0000 mg/m2 | Freq: Once | INTRAVENOUS | Status: AC
  Administered 2023-07-17: 900 mg via INTRAVENOUS
  Filled 2023-07-17: qty 20

## 2023-07-17 MED ORDER — PROCHLORPERAZINE MALEATE 10 MG PO TABS
10.0000 mg | ORAL_TABLET | Freq: Once | ORAL | Status: AC
  Administered 2023-07-17: 10 mg via ORAL
  Filled 2023-07-17: qty 1

## 2023-07-17 MED ORDER — SODIUM CHLORIDE 0.9% FLUSH
10.0000 mL | INTRAVENOUS | Status: DC | PRN
  Administered 2023-07-17: 10 mL

## 2023-07-17 MED ORDER — SODIUM CHLORIDE 0.9% FLUSH
10.0000 mL | Freq: Once | INTRAVENOUS | Status: AC
  Administered 2023-07-17: 10 mL

## 2023-07-17 MED ORDER — SODIUM CHLORIDE 0.9 % IV SOLN: Freq: Once | INTRAVENOUS | Status: AC

## 2023-07-17 MED ORDER — SODIUM CHLORIDE 0.9 % IV SOLN
200.0000 mg | Freq: Once | INTRAVENOUS | Status: AC
  Administered 2023-07-17: 200 mg via INTRAVENOUS
  Filled 2023-07-17: qty 200

## 2023-07-17 MED ORDER — HEPARIN SOD (PORK) LOCK FLUSH 100 UNIT/ML IV SOLN
500.0000 [IU] | Freq: Once | INTRAVENOUS | Status: AC | PRN
  Administered 2023-07-17: 500 [IU]

## 2023-07-17 NOTE — Progress Notes (Signed)
Palliative Medicine [facility]  Telephone:([phone] Fax:(336) [phone]   Name: Samuel [name] Date: 07/17/2023 MRN: [id number]  DOB: [birth year]  Patient Care Team: Samuel Frisk, MD as PCP - General (Pulmonary Disease) Samuel Fendt, NP as Nurse Practitioner (Nurse Practitioner) Samuel [name], Samuel Baumgartner, NP as Nurse Practitioner (Nurse Practitioner)    INTERVAL HISTORY: Samuel [name] is a 57 y.o. 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  Palliative ask to see for symptom management and goals of care.   SOCIAL HISTORY:     reports that he has been smoking cigarettes. He has never used smokeless tobacco. He reports current alcohol use of about 50.0 standard drinks of alcohol per week. He reports that he does not use drugs.  ADVANCE DIRECTIVES:  Patient does not have an advanced directives. Education and packet provided. He has expressed interest in completing. States his sister, Samuel [name] would be his designated Clinical research associate.   CODE STATUS: Full code  PAST MEDICAL HISTORY: Past Medical History:  Diagnosis Date   Alcohol use disorder, severe, dependence (HCC) 12/26/2016   Diabetes mellitus without complication (HCC)    Metastatic cancer to brain (HCC) 08/05/2022   Metastatic cancer to spine (HCC) 07/19/2022   Pancreatitis unk   Primary lung adenocarcinoma (HCC) 08/05/2022   Suicidal ideation    Type 2 diabetes mellitus with hyperglycemia, without long-term current use of insulin (HCC) 06/13/2022   Formatting of this note might be different from the original. 06/13/2022 A1C 13.8, FSBG 414 Start empagliflozin 5mg /metformin 1000 daily, levemir 20 qhs. (Samples given) Will apply for medassist for pharmacy    ALLERGIES:  is allergic to penicillins and percocet [oxycodone-acetaminophen].  MEDICATIONS:   Current Outpatient Medications  Medication Sig Dispense Refill   Accu-Chek Softclix Lancets lancets Use to check blood sugar 3 times daily. E11.69 100 each 6   atorvastatin (LIPITOR) 40 MG tablet Take 1 tablet (40 mg total) by mouth daily. 90 tablet 1   benzonatate (TESSALON) 100 MG capsule Take 1 capsule (100 mg total) by mouth 2 (two) times daily as needed. For cough 30 capsule 2   blood glucose meter kit and supplies KIT Dispense based on patient and insurance preference. Use up to four times daily as directed. 1 each 0   Blood Glucose Monitoring Suppl (ACCU-CHEK GUIDE) w/Device KIT Use to check blood sugar 3 times daily. E11.69 1 kit 0   buPROPion (WELLBUTRIN XL) 150 MG 24 hr tablet Take 1 tablet (150 mg total) by mouth daily. 90 tablet 1   empagliflozin (JARDIANCE) 25 MG TABS tablet Take 1 tablet (25 mg total) by mouth daily before breakfast. 90 tablet 1   folic acid (FOLVITE) 1 MG tablet Take 1 tablet (1 mg total) by mouth daily. 30 tablet 4   gabapentin (NEURONTIN) 300 MG capsule Take 2 capsules (600 mg total) by mouth 3 (three) times daily. 180 capsule 1   glucose blood (ACCU-CHEK GUIDE) test strip Use to check blood sugar 3 times daily. E11.69 100 each 6   insulin glargine (LANTUS SOLOSTAR) 100 UNIT/ML Solostar Pen Inject 50 Units into the skin daily. 15 mL 11   Insulin Pen Needle 32G X 4 MM MISC Use with Insulin pen 100 each 3   ipratropium-albuterol (DUONEB) 0.5-2.5 (3) MG/3ML SOLN Take 3 mLs by nebulization every 6 (six) hours as needed (Shortness of breath).  360 mL 0   lidocaine-prilocaine (EMLA) cream Apply to the Port-A-Cath site 30-60 minutes before chemo. 30 g 0   Oxycodone HCl 10 MG TABS Take 1 tablet (10 mg) by mouth every 6 hours as needed for severe pain. 60 tablet 0   prochlorperazine (COMPAZINE) 10 MG tablet Take 1 tablet (10 mg total) by mouth every 6 (six) hours as needed for nausea or vomiting. 30 tablet 0   sildenafil (VIAGRA) 100 MG tablet Take 1/2 - 1 tablet by mouth  daily as needed for erectile dysfunction. 15 tablet 11   tiZANidine (ZANAFLEX) 2 MG tablet Take 1 tablet (2 mg) by mouth at bedtime. 30 tablet 1   No current facility-administered medications for this visit.    VITAL SIGNS: There were no vitals taken for this visit. There were no vitals filed for this visit.  Estimated body mass index is 22.96 kg/m as calculated from the following:   Height as of 06/26/23: 5\' 8"  (1.727 m).   Weight as of 07/12/23: 151 lb (68.5 kg).   PERFORMANCE STATUS (ECOG) : 1 - Symptomatic but completely ambulatory   Physical Exam General: NAD Cardiovascular: regular rate and rhythm Pulmonary:normal breathing pattern  Extremities: no edema, no joint deformities, brace in place  Skin: no rashes Neurological: AAO x4  IMPRESSION: Mr. Samuel [name] presents to clinic today for follow-up.  No acute distress noted.  Continues to do well overall.  Shares recent visit with Dr. Delford Field.  Has recently started on Wellbutrin to assist with smoking cessation as well as some depression.  Denies nausea, vomiting, constipation, or diarrhea.  Appetite is good.  Neoplasm related pain Samuel [name] reports pain is well controlled on current regimen. MS Contin has been successfully discontinued.   Oxycodone 10 mg  as needed. He is not requiring daily. Gabapentin as prescribed. Neuropathic pain improving. Taking all medications as prescribed. Discussed use of Tylenol for mild aches and pain more arthritic focused as well as remaining active. He acknowledges pain increases when he is less mobile for long periods of time.   We will continue to closely monitor.   Pain contract on file.    2.  Appetite Continues to improve.  Weight is stable at 146lbs.  Expresses appreciation of being able to eat foods that he wants enjoyed.  PLAN: MS Contin discontinued.  Patient has successfully weaned appropriately. Oxy IR 10 mg every 6 hours as needed for breakthrough pain. Not requiring daily.  Colace  twice daily Gabapentin 600 mg three times daily  Appetite much improved.Weight is stable. Pain contract signed on 10/23. I will plan to see patient back in 3-4 weeks in collaboration with his other oncology appointments.   Patient expressed understanding and was in agreement with this plan. He also understands that He can call the clinic at any time with any questions, concerns, or complaints.       Any controlled substances utilized were prescribed in the context of palliative care. PDMP has been reviewed.    Visit consisted of counseling and education dealing with the complex and emotionally intense issues of symptom management and palliative care in the setting of serious and potentially life-threatening illness.Greater than 50%  of this time was spent counseling and coordinating care related to the above assessment and plan.  Willette Alma, AGPCNP-BC  Palliative Medicine Team/[facility]  *Please note that this is a verbal dictation therefore any spelling or grammatical errors are due to the "Dragon Medical One" system interpretation.

## 2023-07-17 NOTE — Progress Notes (Signed)
[facility] Telephone:(336) [phone]   Fax:(336) [phone]  OFFICE PROGRESS NOTE  [name], MD [address] [place] [address]  DIAGNOSIS: stage IV (T3a, N2, M1 C) non-small cell lung cancer, adenocarcinoma presented with right pulmonary nodules in addition to right hilar and mediastinal lymphadenopathy, pleural effusion, brain metastasis as well as extensive metastatic bone disease in the lumbar spines status post biopsy with kyphoplasty at the L2 lesion and brain metastasis diagnosed in September 2023.  Biomarker Findings Microsatellite status - MS-Stable Tumor Mutational Burden - 8 Muts/Mb Genomic Findings For a complete list of the genes assayed, please refer to the Appendix. KEAP1 Q227* CDKN2A/B [id number] G89V PRKCI amplification TERC amplification - equivocal? TP53 [id number]*18 8 Disease relevant genes with no reportable alterations: ALK, BRAF, EGFR, ERBB2, KRAS, MET, RET, ROS1  PDL1 TPS  90%  PRIOR THERAPY:  1) Status post palliative radiotherapy to the L2 lesion under the care of Dr. Kathrynn Running. 2) Status post SRS to solitary brain metastasis under the care of Dr. Kathrynn Running  CURRENT THERAPY: Palliative systemic therapy with carboplatin for AUC of 5, Alimta 500 Mg/M2 and Keytruda 200 Mg IV every 3 weeks.  First dose September 05, 2022.  Status post 15 cycles.  Starting from cycle #5 the patient will be on maintenance treatment with Alimta and Keytruda every 3 weeks.  INTERVAL HISTORY: Samuel [name] 57 y.o. male returns to the clinic today for follow-up visit.  The patient is feeling fine today with no concerning complaints.  He has no chest pain, shortness of breath, cough or hemoptysis.  He has no nausea, vomiting, diarrhea or constipation.  He continues to complain of mild fatigue.  He has no recent weight loss or night sweats.  He has been tolerating his treatment with maintenance Alimta and Keytruda fairly well.  He is here for  evaluation before starting cycle #16.   MEDICAL HISTORY: Past Medical History:  Diagnosis Date   Alcohol use disorder, severe, dependence (HCC) 12/26/2016   Diabetes mellitus without complication (HCC)    Metastatic cancer to brain (HCC) 08/05/2022   Metastatic cancer to spine (HCC) 07/19/2022   Pancreatitis unk   Primary lung adenocarcinoma (HCC) 08/05/2022   Suicidal ideation    Type 2 diabetes mellitus with hyperglycemia, without long-term current use of insulin (HCC) 06/13/2022   Formatting of this note might be different from the original. 06/13/2022 A1C 13.8, FSBG 414 Start empagliflozin 5mg /metformin 1000 daily, levemir 20 qhs. (Samples given) Will apply for medassist for pharmacy    ALLERGIES:  is allergic to penicillins and percocet [oxycodone-acetaminophen].  MEDICATIONS:  Current Outpatient Medications  Medication Sig Dispense Refill   Accu-Chek Softclix Lancets lancets Use to check blood sugar 3 times daily. E11.69 100 each 6   atorvastatin (LIPITOR) 40 MG tablet Take 1 tablet (40 mg total) by mouth daily. 90 tablet 1   benzonatate (TESSALON) 100 MG capsule Take 1 capsule (100 mg total) by mouth 2 (two) times daily as needed. For cough 30 capsule 2   blood glucose meter kit and supplies KIT Dispense based on patient and insurance preference. Use up to four times daily as directed. 1 each 0   Blood Glucose Monitoring Suppl (ACCU-CHEK GUIDE) w/Device KIT Use to check blood sugar 3 times daily. E11.69 1 kit 0   buPROPion (WELLBUTRIN XL) 150 MG 24 hr tablet Take 1 tablet (150 mg total) by mouth daily. 90 tablet 1   empagliflozin (JARDIANCE) 25  MG TABS tablet Take 1 tablet (25 mg total) by mouth daily before breakfast. 90 tablet 1   folic acid (FOLVITE) 1 MG tablet Take 1 tablet (1 mg total) by mouth daily. 30 tablet 4   gabapentin (NEURONTIN) 300 MG capsule Take 2 capsules (600 mg total) by mouth 3 (three) times daily. 180 capsule 1   glucose blood (ACCU-CHEK GUIDE) test strip  Use to check blood sugar 3 times daily. E11.69 100 each 6   insulin glargine (LANTUS SOLOSTAR) 100 UNIT/ML Solostar Pen Inject 50 Units into the skin daily. 15 mL 11   Insulin Pen Needle 32G X 4 MM MISC Use with Insulin pen 100 each 3   ipratropium-albuterol (DUONEB) 0.5-2.5 (3) MG/3ML SOLN Take 3 mLs by nebulization every 6 (six) hours as needed (Shortness of breath). 360 mL 0   lidocaine-prilocaine (EMLA) cream Apply to the Port-A-Cath site 30-60 minutes before chemo. 30 g 0   Oxycodone HCl 10 MG TABS Take 1 tablet (10 mg) by mouth every 6 hours as needed for severe pain. 60 tablet 0   prochlorperazine (COMPAZINE) 10 MG tablet Take 1 tablet (10 mg total) by mouth every 6 (six) hours as needed for nausea or vomiting. 30 tablet 0   sildenafil (VIAGRA) 100 MG tablet Take 1/2 - 1 tablet by mouth daily as needed for erectile dysfunction. 15 tablet 11   tiZANidine (ZANAFLEX) 2 MG tablet Take 1 tablet (2 mg) by mouth at bedtime. 30 tablet 1   No current facility-administered medications for this visit.    SURGICAL HISTORY:  Past Surgical History:  Procedure Laterality Date   IR BONE TUMOR(S)RF ABLATION  08/01/2022   IR IMAGING GUIDED PORT INSERTION  09/21/2022   IR KYPHO LUMBAR INC FX REDUCE BONE BX UNI/BIL CANNULATION INC/IMAGING  08/01/2022    REVIEW OF SYSTEMS:  A comprehensive review of systems was negative except for: Constitutional: positive for fatigue   PHYSICAL EXAMINATION: General appearance: alert, cooperative, fatigued, and no distress Head: Normocephalic, without obvious abnormality, atraumatic Neck: no adenopathy, no JVD, supple, symmetrical, trachea midline, and thyroid not enlarged, symmetric, no tenderness/mass/nodules Lymph nodes: Cervical, supraclavicular, and axillary nodes normal. Resp: clear to auscultation bilaterally Back: symmetric, no curvature. ROM normal. No CVA tenderness. Cardio: regular rate and rhythm, S1, S2 normal, no murmur, click, rub or gallop GI: soft,  non-tender; bowel sounds normal; no masses,  no organomegaly Extremities: extremities normal, atraumatic, no cyanosis or edema  ECOG PERFORMANCE STATUS: 1 - Symptomatic but completely ambulatory  Blood pressure 132/77, pulse 88, temperature 98.5 F (36.9 C), resp. rate 17, SpO2 100%.  LABORATORY DATA: Lab Results  Component Value Date   WBC 5.3 07/17/2023   HGB 13.1 07/17/2023   HCT 38.5 (L) 07/17/2023   MCV 99.5 07/17/2023   PLT 202 07/17/2023      Chemistry      Component Value Date/Time   NA 139 06/26/2023 0925   NA 140 12/24/2020 1144   NA 142 08/02/2014 1649   K 3.7 06/26/2023 0925   K 4.2 08/02/2014 1649   CL 105 06/26/2023 0925   CL 109 (H) 08/02/2014 1649   CO2 28 06/26/2023 0925   CO2 21 08/02/2014 1649   BUN 14 06/26/2023 0925   BUN 7 12/24/2020 1144   BUN 11 08/02/2014 1649   CREATININE 0.59 (L) 06/26/2023 0925   CREATININE 0.92 08/02/2014 1649      Component Value Date/Time   CALCIUM 8.8 (L) 06/26/2023 0925   CALCIUM 8.6 08/02/2014 1649  ALKPHOS 74 06/26/2023 0925   ALKPHOS 82 08/02/2014 1649   AST 26 06/26/2023 0925   ALT 32 06/26/2023 0925   ALT 132 (H) 08/02/2014 1649   BILITOT 0.4 06/26/2023 0925       RADIOGRAPHIC STUDIES: No results found.  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 He has molecular studies by foundation 1 that showed no actionable mutations and PD-L1 expression was 90%. He underwent palliative radiotherapy to the L2 vertebral body compression fraction as well as solitary brain metastasis with SRS. He is  currently undergoing palliative systemic chemotherapy with carboplatin for AUC of 5, Alimta 500 Mg/M2 and Keytruda 200 Mg IV every 3 weeks.  Status post 15 cycles.  Starting from cycle #5 he is on treatment with maintenance Alimta and Keytruda every 3 weeks. The patient has been tolerating this treatment fairly well with no concerning adverse effects. I recommended for him to proceed with cycle #16 today as planned. I will see him back for follow-up visit in 3 weeks for evaluation with repeat CT scan of the chest, abdomen and pelvis for restaging of his disease. For the pain management, he is currently on MS Contin 15 mg p.o. twice daily in addition to Oxy IR for breakthrough pain and managed by the palliative care team.  He was advised to call immediately if he has any other concerning symptoms in the interval. The patient voices understanding of current disease status and treatment options and is in agreement with the current care plan.  All questions were answered. The patient knows to call the clinic with any problems, questions or concerns. We can certainly see the patient much sooner if necessary.  The total time spent in the appointment was 20 minutes.  Disclaimer: This note was dictated with voice recognition software. Similar sounding words can inadvertently be transcribed and may not be corrected upon review.

## 2023-07-31 ENCOUNTER — Encounter (HOSPITAL_COMMUNITY): Payer: Self-pay

## 2023-07-31 ENCOUNTER — Ambulatory Visit (HOSPITAL_COMMUNITY)
Admission: RE | Admit: 2023-07-31 | Discharge: 2023-07-31 | Disposition: A | Discharge status: Home / Self Care | Payer: Medicaid Other | Source: Ambulatory Visit | Attending: Internal Medicine | Admitting: Internal Medicine

## 2023-07-31 DIAGNOSIS — C3490 Malignant neoplasm of unspecified part of unspecified bronchus or lung: Secondary | ICD-10-CM | POA: Insufficient documentation

## 2023-07-31 MED ORDER — HEPARIN SOD (PORK) LOCK FLUSH 100 UNIT/ML IV SOLN
INTRAVENOUS | Status: AC
  Filled 2023-07-31: qty 5

## 2023-07-31 MED ORDER — SODIUM CHLORIDE (PF) 0.9 % IJ SOLN
INTRAMUSCULAR | Status: AC
  Filled 2023-07-31: qty 50

## 2023-07-31 MED ORDER — IOHEXOL 300 MG/ML  SOLN
100.0000 mL | Freq: Once | INTRAMUSCULAR | Status: AC | PRN
  Administered 2023-07-31: 100 mL via INTRAVENOUS

## 2023-07-31 MED ORDER — HEPARIN SOD (PORK) LOCK FLUSH 100 UNIT/ML IV SOLN
500.0000 [IU] | Freq: Once | INTRAVENOUS | Status: AC
  Administered 2023-07-31: 500 [IU] via INTRAVENOUS

## 2023-08-01 ENCOUNTER — Other Ambulatory Visit: Payer: Self-pay

## 2023-08-01 ENCOUNTER — Other Ambulatory Visit (HOSPITAL_COMMUNITY): Payer: Self-pay

## 2023-08-01 ENCOUNTER — Other Ambulatory Visit: Payer: Self-pay | Admitting: Nurse Practitioner

## 2023-08-01 ENCOUNTER — Encounter: Payer: Self-pay | Admitting: Internal Medicine

## 2023-08-01 DIAGNOSIS — C3490 Malignant neoplasm of unspecified part of unspecified bronchus or lung: Secondary | ICD-10-CM

## 2023-08-01 DIAGNOSIS — M792 Neuralgia and neuritis, unspecified: Secondary | ICD-10-CM

## 2023-08-01 DIAGNOSIS — G893 Neoplasm related pain (acute) (chronic): Secondary | ICD-10-CM

## 2023-08-01 DIAGNOSIS — C7951 Secondary malignant neoplasm of bone: Secondary | ICD-10-CM

## 2023-08-01 DIAGNOSIS — Z515 Encounter for palliative care: Secondary | ICD-10-CM

## 2023-08-01 MED ORDER — OXYCODONE HCL 10 MG PO TABS
10.0000 mg | ORAL_TABLET | Freq: Four times a day (QID) | ORAL | 0 refills | Status: AC | PRN
Start: 2023-08-01 — End: ?
  Filled 2023-08-01: qty 60, 15d supply, fill #0

## 2023-08-02 ENCOUNTER — Other Ambulatory Visit (HOSPITAL_COMMUNITY): Payer: Self-pay

## 2023-08-03 ENCOUNTER — Encounter: Payer: Self-pay | Admitting: Internal Medicine

## 2023-08-03 ENCOUNTER — Other Ambulatory Visit (HOSPITAL_COMMUNITY): Payer: Self-pay

## 2023-08-07 ENCOUNTER — Inpatient Hospital Stay (HOSPITAL_BASED_OUTPATIENT_CLINIC_OR_DEPARTMENT_OTHER): Payer: Medicaid Other | Admitting: Internal Medicine

## 2023-08-07 ENCOUNTER — Other Ambulatory Visit: Payer: Medicaid Other

## 2023-08-07 ENCOUNTER — Inpatient Hospital Stay: Payer: Medicaid Other | Admitting: Licensed Clinical Social Worker

## 2023-08-07 ENCOUNTER — Inpatient Hospital Stay: Payer: Medicaid Other | Attending: Adult Health

## 2023-08-07 ENCOUNTER — Inpatient Hospital Stay: Payer: Medicaid Other

## 2023-08-07 ENCOUNTER — Ambulatory Visit: Payer: Medicaid Other

## 2023-08-07 VITALS — BP 117/88 | HR 81 | Temp 98.2°F | Resp 17 | Ht 68.0 in | Wt 150.0 lb

## 2023-08-07 DIAGNOSIS — Z7984 Long term (current) use of oral hypoglycemic drugs: Secondary | ICD-10-CM | POA: Diagnosis not present

## 2023-08-07 DIAGNOSIS — C3490 Malignant neoplasm of unspecified part of unspecified bronchus or lung: Secondary | ICD-10-CM

## 2023-08-07 DIAGNOSIS — Z794 Long term (current) use of insulin: Secondary | ICD-10-CM | POA: Insufficient documentation

## 2023-08-07 DIAGNOSIS — E119 Type 2 diabetes mellitus without complications: Secondary | ICD-10-CM | POA: Insufficient documentation

## 2023-08-07 DIAGNOSIS — C7931 Secondary malignant neoplasm of brain: Secondary | ICD-10-CM | POA: Diagnosis not present

## 2023-08-07 DIAGNOSIS — C3481 Malignant neoplasm of overlapping sites of right bronchus and lung: Secondary | ICD-10-CM | POA: Insufficient documentation

## 2023-08-07 DIAGNOSIS — Z5111 Encounter for antineoplastic chemotherapy: Secondary | ICD-10-CM | POA: Insufficient documentation

## 2023-08-07 DIAGNOSIS — R5383 Other fatigue: Secondary | ICD-10-CM | POA: Insufficient documentation

## 2023-08-07 DIAGNOSIS — R45851 Suicidal ideations: Secondary | ICD-10-CM | POA: Diagnosis not present

## 2023-08-07 DIAGNOSIS — F1020 Alcohol dependence, uncomplicated: Secondary | ICD-10-CM | POA: Insufficient documentation

## 2023-08-07 DIAGNOSIS — Z79899 Other long term (current) drug therapy: Secondary | ICD-10-CM | POA: Insufficient documentation

## 2023-08-07 DIAGNOSIS — C7951 Secondary malignant neoplasm of bone: Secondary | ICD-10-CM | POA: Diagnosis not present

## 2023-08-07 LAB — CMP (CANCER CENTER ONLY)
ALT: 26 U/L (ref 0–44)
AST: 21 U/L (ref 15–41)
Albumin: 3.8 g/dL (ref 3.5–5.0)
Alkaline Phosphatase: 71 U/L (ref 38–126)
Anion gap: 5 (ref 5–15)
BUN: 9 mg/dL (ref 6–20)
CO2: 29 mmol/L (ref 22–32)
Calcium: 9.5 mg/dL (ref 8.9–10.3)
Chloride: 105 mmol/L (ref 98–111)
Creatinine: 0.58 mg/dL — ABNORMAL LOW (ref 0.61–1.24)
GFR, Estimated: >60 mL/min (ref >60)
Glucose, Bld: 92 mg/dL (ref 70–99)
Potassium: 3.7 mmol/L (ref 3.5–5.1)
Sodium: 139 mmol/L (ref 135–145)
Total Bilirubin: 0.3 mg/dL (ref 0.3–1.2)
Total Protein: 7.2 g/dL (ref 6.5–8.1)

## 2023-08-07 LAB — CBC WITH DIFFERENTIAL (CANCER CENTER ONLY)
Abs Immature Granulocytes: 0.03 10*3/uL (ref 0.00–0.07)
Basophils Absolute: 0 10*3/uL (ref 0.0–0.1)
Basophils Relative: 0 %
Eosinophils Absolute: 0.1 10*3/uL (ref 0.0–0.5)
Eosinophils Relative: 1 %
HCT: 37 % — ABNORMAL LOW (ref 39.0–52.0)
Hemoglobin: 13.1 g/dL (ref 13.0–17.0)
Immature Granulocytes: 1 %
Lymphocytes Relative: 18 %
Lymphs Abs: 1.1 10*3/uL (ref 0.7–4.0)
MCH: 34.6 pg — ABNORMAL HIGH (ref 26.0–34.0)
MCHC: 35.4 g/dL (ref 30.0–36.0)
MCV: 97.6 fL (ref 80.0–100.0)
Monocytes Absolute: 0.6 10*3/uL (ref 0.1–1.0)
Monocytes Relative: 10 %
Neutro Abs: 4.1 10*3/uL (ref 1.7–7.7)
Neutrophils Relative %: 70 %
Platelet Count: 292 10*3/uL (ref 150–400)
RBC: 3.79 MIL/uL — ABNORMAL LOW (ref 4.22–5.81)
RDW: 13.5 % (ref 11.5–15.5)
WBC Count: 5.9 10*3/uL (ref 4.0–10.5)
nRBC: 0 % (ref 0.0–0.2)

## 2023-08-07 MED ORDER — SODIUM CHLORIDE 0.9% FLUSH
10.0000 mL | Freq: Once | INTRAVENOUS | Status: AC
  Administered 2023-08-07: 10 mL

## 2023-08-07 MED ORDER — SODIUM CHLORIDE 0.9 % IV SOLN
200.0000 mg | Freq: Once | INTRAVENOUS | Status: AC
  Administered 2023-08-07: 200 mg via INTRAVENOUS
  Filled 2023-08-07: qty 200

## 2023-08-07 MED ORDER — PROCHLORPERAZINE MALEATE 10 MG PO TABS
10.0000 mg | ORAL_TABLET | Freq: Once | ORAL | Status: AC
  Administered 2023-08-07: 10 mg via ORAL
  Filled 2023-08-07: qty 1

## 2023-08-07 MED ORDER — SODIUM CHLORIDE 0.9 % IV SOLN
500.0000 mg/m2 | Freq: Once | INTRAVENOUS | Status: AC
  Administered 2023-08-07: 900 mg via INTRAVENOUS
  Filled 2023-08-07: qty 20

## 2023-08-07 MED ORDER — HEPARIN SOD (PORK) LOCK FLUSH 100 UNIT/ML IV SOLN
500.0000 [IU] | Freq: Once | INTRAVENOUS | Status: AC | PRN
  Administered 2023-08-07: 500 [IU]

## 2023-08-07 MED ORDER — SODIUM CHLORIDE 0.9% FLUSH
10.0000 mL | INTRAVENOUS | Status: DC | PRN
  Administered 2023-08-07: 10 mL

## 2023-08-07 MED ORDER — SODIUM CHLORIDE 0.9 % IV SOLN: Freq: Once | INTRAVENOUS | Status: AC

## 2023-08-07 NOTE — Progress Notes (Signed)
[facility] Telephone:([phone]) [phone]   [phone]  OFFICE PROGRESS NOTE  [name], MD 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  Biomarker Findings Microsatellite status - MS-Stable Tumor Mutational Burden - 8 Muts/Mb Genomic Findings For a complete list of the genes assayed, please refer to the Appendix. KEAP1 Q227* CDKN2A/B [id number] G89V PRKCI amplification TERC amplification - equivocal? TP53 [id number]*18 8 Disease relevant genes with no reportable alterations: ALK, BRAF, EGFR, ERBB2, KRAS, MET, RET, ROS1  PDL1 TPS  90%  PRIOR THERAPY:  1) Status post palliative radiotherapy to the L2 lesion under the care of Dr. Kathrynn Running. 2) Status post SRS to solitary brain metastasis under the care of Dr. Kathrynn Running  CURRENT THERAPY: Palliative systemic therapy with carboplatin for AUC of 5, Alimta 500 Mg/M2 and Keytruda 200 Mg IV every 3 weeks.  First dose September 05, 2022.  Status post 16 cycles.  Starting from cycle #5 the patient will be on maintenance treatment with Alimta and Keytruda every 3 weeks.  INTERVAL HISTORY: Samuel [name] 57 y.o. male returns to the clinic today for follow-up visit.  The patient is feeling fine today with no concerning complaints except for mild fatigue.  He denied having any chest pain, shortness of breath, cough or hemoptysis.  He has no nausea, vomiting, diarrhea or constipation.  He denied having any headache or visual changes.  He has no recent weight loss or night sweats.  He has been tolerating his maintenance treatment with Alimta and  Keytruda fairly well.  He had repeat CT scan of the chest, abdomen and pelvis performed on 07/31/2023 but the final report is still pending.  He is here for evaluation before starting cycle #17.   MEDICAL HISTORY: Past Medical History:  Diagnosis Date   Alcohol use disorder, severe, dependence (HCC) 12/26/2016   Diabetes mellitus without complication (HCC)    Metastatic cancer to brain (HCC) 08/05/2022   Metastatic cancer to spine (HCC) 07/19/2022   Pancreatitis unk   Primary lung adenocarcinoma (HCC) 08/05/2022   Suicidal ideation    Type 2 diabetes mellitus with hyperglycemia, without long-term current use of insulin (HCC) 06/13/2022   Formatting of this note might be different from the original. 06/13/2022 A1C 13.8, FSBG 414 Start empagliflozin 5mg /metformin 1000 daily, levemir 20 qhs. (Samples given) Will apply for medassist for pharmacy    ALLERGIES:  is allergic to penicillins and percocet [oxycodone-acetaminophen].  MEDICATIONS:  Current Outpatient Medications  Medication Sig Dispense Refill   Accu-Chek Softclix Lancets lancets Use to check blood sugar 3 times daily. E11.69 100 each 6   atorvastatin (LIPITOR) 40 MG tablet Take 1 tablet (40 mg total) by mouth daily. 90 tablet 1   benzonatate (TESSALON) 100 MG capsule Take 1 capsule (100 mg total) by mouth 2 (two) times daily as needed. For cough 30 capsule 2   blood glucose meter kit and supplies KIT Dispense based on patient and insurance preference. Use up to four times daily as directed. 1 each 0   Blood Glucose Monitoring Suppl (ACCU-CHEK GUIDE) w/Device KIT Use to check blood sugar 3 times daily. E11.69 1 kit  0   buPROPion (WELLBUTRIN XL) 150 MG 24 hr tablet Take 1 tablet (150 mg total) by mouth daily. 90 tablet 1   empagliflozin (JARDIANCE) 25 MG TABS tablet Take 1 tablet (25 mg total) by mouth daily before breakfast. 90 tablet 1   folic acid (FOLVITE) 1 MG tablet Take 1 tablet (1 mg total) by mouth daily. 30 tablet 4    gabapentin (NEURONTIN) 300 MG capsule Take 2 capsules (600 mg total) by mouth 3 (three) times daily. 180 capsule 1   glucose blood (ACCU-CHEK GUIDE) test strip Use to check blood sugar 3 times daily. E11.69 100 each 6   insulin glargine (LANTUS SOLOSTAR) 100 UNIT/ML Solostar Pen Inject 50 Units into the skin daily. 15 mL 11   Insulin Pen Needle 32G X 4 MM MISC Use with Insulin pen 100 each 3   ipratropium-albuterol (DUONEB) 0.5-2.5 (3) MG/3ML SOLN Take 3 mLs by nebulization every 6 (six) hours as needed (Shortness of breath). 360 mL 0   lidocaine-prilocaine (EMLA) cream Apply to the Port-A-Cath site 30-60 minutes before chemo. 30 g 0   Oxycodone HCl 10 MG TABS Take 1 tablet (10 mg) by mouth every 6 hours as needed for severe pain. 60 tablet 0   prochlorperazine (COMPAZINE) 10 MG tablet Take 1 tablet (10 mg total) by mouth every 6 (six) hours as needed for nausea or vomiting. 30 tablet 0   sildenafil (VIAGRA) 100 MG tablet Take 1/2 - 1 tablet by mouth daily as needed for erectile dysfunction. 15 tablet 11   tiZANidine (ZANAFLEX) 2 MG tablet Take 1 tablet (2 mg) by mouth at bedtime. 30 tablet 1   No current facility-administered medications for this visit.    SURGICAL HISTORY:  Past Surgical History:  Procedure Laterality Date   IR BONE TUMOR(S)RF ABLATION  08/01/2022   IR IMAGING GUIDED PORT INSERTION  09/21/2022   IR KYPHO LUMBAR INC FX REDUCE BONE BX UNI/BIL CANNULATION INC/IMAGING  08/01/2022    REVIEW OF SYSTEMS:  Constitutional: positive for fatigue Eyes: negative Ears, nose, mouth, throat, and face: negative Respiratory: negative Cardiovascular: negative Gastrointestinal: negative Genitourinary:negative Integument/breast: negative Hematologic/lymphatic: negative Musculoskeletal:negative Neurological: negative Behavioral/Psych: negative Endocrine: negative Allergic/Immunologic: negative   PHYSICAL EXAMINATION: General appearance: alert, cooperative, fatigued, and no  distress Head: Normocephalic, without obvious abnormality, atraumatic Neck: no adenopathy, no JVD, supple, symmetrical, trachea midline, and thyroid not enlarged, symmetric, no tenderness/mass/nodules Lymph nodes: Cervical, supraclavicular, and axillary nodes normal. Resp: clear to auscultation bilaterally Back: symmetric, no curvature. ROM normal. No CVA tenderness. Cardio: regular rate and rhythm, S1, S2 normal, no murmur, click, rub or gallop GI: soft, non-tender; bowel sounds normal; no masses,  no organomegaly Extremities: extremities normal, atraumatic, no cyanosis or edema Neurologic: Alert and oriented X 3, normal strength and tone. Normal symmetric reflexes. Normal coordination and gait  ECOG PERFORMANCE STATUS: 1 - Symptomatic but completely ambulatory  Blood pressure 117/88, pulse 81, temperature 98.2 F (36.8 C), temperature source Oral, resp. rate 17, height 5\' 8"  (1.727 m), weight 150 lb (68 kg), SpO2 100%.  LABORATORY DATA: Lab Results  Component Value Date   WBC 5.9 08/07/2023   HGB 13.1 08/07/2023   HCT 37.0 (L) 08/07/2023   MCV 97.6 08/07/2023   PLT 292 08/07/2023      Chemistry      Component Value Date/Time   NA 138 07/17/2023 1010   NA 140 12/24/2020 1144   NA 142 08/02/2014 1649   K 4.3 07/17/2023 1010   K 4.2 08/02/2014  1649   CL 106 07/17/2023 1010   CL 109 (H) 08/02/2014 1649   CO2 28 07/17/2023 1010   CO2 21 08/02/2014 1649   BUN 14 07/17/2023 1010   BUN 7 12/24/2020 1144   BUN 11 08/02/2014 1649   CREATININE 0.61 07/17/2023 1010   CREATININE 0.92 08/02/2014 1649      Component Value Date/Time   CALCIUM 9.1 07/17/2023 1010   CALCIUM 8.6 08/02/2014 1649   ALKPHOS 65 07/17/2023 1010   ALKPHOS 82 08/02/2014 1649   AST 48 (H) 07/17/2023 1010   ALT 33 07/17/2023 1010   ALT 132 (H) 08/02/2014 1649   BILITOT 0.3 07/17/2023 1010       RADIOGRAPHIC STUDIES: No results found.  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 He has molecular studies by foundation 1 that showed no actionable mutations and PD-L1 expression was 90%. He underwent palliative radiotherapy to the L2 vertebral body compression fraction as well as solitary brain metastasis with SRS. He is currently undergoing palliative systemic chemotherapy with carboplatin for AUC of 5, Alimta 500 Mg/M2 and Keytruda 200 Mg IV every 3 weeks.  Status post 16 cycles. Starting from cycle #5 he is on treatment with maintenance Alimta and Keytruda every 3 weeks. The patient has been tolerating this treatment well with no concerning adverse effects. The patient had repeat CT scan of the chest, abdomen and pelvis performed last week.  Unfortunately the final report is still pending.  I personally and independently reviewed the scan images and I did not catch any signs of disease progression but definitely I will wait for the final report for confirmation. I recommended for the patient to proceed with cycle #17 today as planned. I will see him back for follow-up visit in 3 weeks for evaluation before the next cycle of his treatment. For the pain management, he is currently on MS Contin 15 mg p.o. twice daily in addition to Oxy IR for breakthrough pain and managed by the palliative care team.  The patient was advised to call immediately if he has any other concerning symptoms in the interval. The patient voices understanding of current disease status and treatment options and is in agreement with the current care plan.  All questions were answered. The patient knows to call  the clinic with any problems, questions or concerns. We can certainly see the patient much sooner if necessary.  The total time spent in the appointment was 30 minutes.  Disclaimer: This note was dictated with voice recognition software. Similar sounding words can inadvertently be transcribed and may not be corrected upon review.

## 2023-08-07 NOTE — Patient Instructions (Signed)
[place] [facility]  Discharge Instructions: Thank you for choosing [facility] to provide your oncology and hematology care.   If you have a lab appointment with the Cancer Center, please go directly to the Cancer Center and check in at the registration area.   Wear comfortable clothing and clothing appropriate for easy access to any Portacath or PICC line.   We strive to give you quality time with your provider. You may need to reschedule your appointment if you arrive late (15 or more minutes).  Arriving late affects you and other patients whose appointments are after yours.  Also, if you miss three or more appointments without notifying the office, you may be dismissed from the clinic at the provider's discretion.      For prescription refill requests, have your pharmacy contact our office and allow 72 hours for refills to be completed.    Today you received the following chemotherapy and/or immunotherapy agents: Keytruda/Alimta      To help prevent nausea and vomiting after your treatment, we encourage you to take your nausea medication as directed.  BELOW ARE SYMPTOMS THAT SHOULD BE REPORTED IMMEDIATELY: *FEVER GREATER THAN 100.4 F (38 C) OR HIGHER *CHILLS OR SWEATING *NAUSEA AND VOMITING THAT IS NOT CONTROLLED WITH YOUR NAUSEA MEDICATION *UNUSUAL SHORTNESS OF BREATH *UNUSUAL BRUISING OR BLEEDING *URINARY PROBLEMS (pain or burning when urinating, or frequent urination) *BOWEL PROBLEMS (unusual diarrhea, constipation, pain near the anus) TENDERNESS IN MOUTH AND THROAT WITH OR WITHOUT PRESENCE OF ULCERS (sore throat, sores in mouth, or a toothache) UNUSUAL RASH, SWELLING OR PAIN  UNUSUAL VAGINAL DISCHARGE OR ITCHING   Items with * indicate a potential emergency and should be followed up as soon as possible or go to the Emergency Department if any problems should occur.  Please show the CHEMOTHERAPY ALERT CARD or IMMUNOTHERAPY ALERT CARD at  check-in to the Emergency Department and triage nurse.  Should you have questions after your visit or need to cancel or reschedule your appointment, please contact [place] [facility]  Dept: [phone]  and follow the prompts.  Office hours are 8:00 a.m. to 4:30 p.m. Monday - Friday. Please note that voicemails left after 4:00 p.m. may not be returned until the following business day.  We are closed weekends and major holidays. You have access to a nurse at all times for urgent questions. Please call the main number to the clinic Dept: [phone] and follow the prompts.   For any non-urgent questions, you may also contact your provider using MyChart. We now offer e-Visits for anyone 67 and older to request care online for non-urgent symptoms. For details visit [email or web address].   Also download the MyChart app! Go to the app store, search "MyChart", open the app, select [place], and log in with your MyChart username and password.

## 2023-08-07 NOTE — Progress Notes (Signed)
[facility] CSW Progress Note  Visual merchandiser met with patient in infusion to discuss housing concerns.  Pt states he has been staying w/ his sister since arriving in [place] from [place].  Pt's sister reportedly is losing her home due to it being foreclosed and pt will be without a place to stay.  Pt reports his sister is planning to relocate to [place], but pt does not wish to relocate with her as his doctors are here and he has established a life for himself.  Pt states when he was living in [place] he was living at a [employer] and cooking for them and would be happy to earn his way somewhere, he just doesn't know where to begin.  CSW provided pt w/ the telephone numbers for [facility] for eviction mediation for his sister as well as contact information for the [employer] and [employer] to start exploring what housing options might be available.  Pt provided w/ a list of shelters should he need and encouraged to connect w/ the [facility] as they may have resources to assist pt.  Pt states his sister will not need to leave immediately, so he does have some time to explore options.  Pt was provided w/ contact information for CSW and CSW will check on pt at next infusion.      Rachel Moulds, LCSW Clinical Social Worker [facility]    Patient is participating in a Managed Medicaid Plan:  Yes

## 2023-08-07 NOTE — Progress Notes (Signed)
Pt reported to infusion today for tx. Pt expressed experiencing trouble with current housing situation. This RN notified the social worker, Darl Pikes. Darl Pikes saw Pt in infusion today and provided Pt with resources.

## 2023-08-21 ENCOUNTER — Encounter: Payer: Self-pay | Admitting: Internal Medicine

## 2023-08-21 ENCOUNTER — Other Ambulatory Visit (HOSPITAL_COMMUNITY): Payer: Self-pay

## 2023-08-21 ENCOUNTER — Other Ambulatory Visit: Payer: Self-pay | Admitting: Nurse Practitioner

## 2023-08-21 DIAGNOSIS — M792 Neuralgia and neuritis, unspecified: Secondary | ICD-10-CM

## 2023-08-21 DIAGNOSIS — C7951 Secondary malignant neoplasm of bone: Secondary | ICD-10-CM

## 2023-08-21 DIAGNOSIS — Z515 Encounter for palliative care: Secondary | ICD-10-CM

## 2023-08-21 DIAGNOSIS — G893 Neoplasm related pain (acute) (chronic): Secondary | ICD-10-CM

## 2023-08-21 DIAGNOSIS — C3490 Malignant neoplasm of unspecified part of unspecified bronchus or lung: Secondary | ICD-10-CM

## 2023-08-21 MED ORDER — OXYCODONE HCL 10 MG PO TABS
10.0000 mg | ORAL_TABLET | Freq: Four times a day (QID) | ORAL | 0 refills | Status: DC | PRN
  Filled 2023-08-21: qty 60, 15d supply, fill #0

## 2023-08-22 ENCOUNTER — Other Ambulatory Visit: Payer: Self-pay

## 2023-08-22 ENCOUNTER — Encounter: Payer: Self-pay | Admitting: Internal Medicine

## 2023-08-22 ENCOUNTER — Other Ambulatory Visit (HOSPITAL_COMMUNITY): Payer: Self-pay

## 2023-08-22 NOTE — Progress Notes (Addendum)
[facility] OFFICE PROGRESS NOTE  Samuel Frisk, MD 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    Biomarker Findings Microsatellite status - MS-Stable Tumor Mutational Burden - 8 Muts/Mb Genomic Findings For a complete list of the genes assayed, please refer to the Appendix. KEAP1 Q227* CDKN2A/B [id number] G89V PRKCI amplification TERC amplification - equivocal? TP53 [id number]*18 8 Disease relevant genes with no reportable alterations: ALK, BRAF, EGFR, ERBB2, KRAS, MET, RET, ROS1   PDL1 TPS  90%   PRIOR THERAPY:  1) Status post palliative radiotherapy to the L2 lesion under the care of Dr. Kathrynn Running. 2) Status post SRS to solitary brain metastasis under the care of Dr. Kathrynn Running  CURRENT THERAPY: Palliative systemic therapy with carboplatin for AUC of 5, Alimta 500 Mg/M2 and Keytruda 200 Mg IV every 3 weeks.  First dose September 05, 2022.  Status post 17 cycles. Starting from cycle #5, he started on maintenance keytruda and alimta IV every 3 weeks.   INTERVAL HISTORY: Samuel [name] 57 y.o. male returns to the clinic today for a follow-up visit. The patient was diagnosed with lung cancer in September 2023.  He is currently undergoing palliative systemic maintenance chemotherapy and immunotherapy.  When he was first diagnosed, he could not walk. He is appreciative in his significant improvement with his quality of life and symptoms compared to when he was first diagnosed. While he still has trouble with back pain and radiating pain down his legs, it is a lot better than it was previously and  he is ambulatory. He has successfully weaned off morphine. He takes oxycodone for break through pain which he is not requiring daily.   Additionally, he has been tolerating treatment well without any significant adverse side effects.  Currently on immunotherapy with Keytruda and chemotherapy with Alimta.  He typically does not have any complaints when he comes into the clinic for his routine appointments.  However, today, the patient is feeling very unwell which is not his baseline.  He was feeling so unwell today, that he was considering going to the emergency room this morning vs coming to the clinic.   Friday night, he went to the comedy zone and started feeling poorly after that.  Denies ingesting any unusual food or drinks.  He has been having significant chills and body aches.  He is also having diarrhea.  He reports he has had over 20 loose stools since Friday.  He estimates he had 4 or 5 loose stools today.  Reports that everything he is eating is "running right through him".  He has not tried taking any Imodium.  He has tried taking Pepto-Bismol and Tums.  He reports he is having sensation of his stomach "flip-flopping".  Denies any known sick contacts.  Denies any recent antibiotic use.  He denies any known fevers, nasal congestion, sore throat, cough, shortness of breath, or headaches.  He received about 2-3 of his COVID-19 vaccines.  He reports some abdominal pain/cramping when he needs to have a bowel movement.  Denies any known blood in the stool.  He was here today before considering starting cycle #18.   MEDICAL HISTORY:  Past Medical History:  Diagnosis Date   Alcohol use disorder, severe, dependence (HCC) 12/26/2016   Diabetes mellitus without complication (HCC)    Metastatic cancer to brain (HCC) 08/05/2022   Metastatic cancer to spine (HCC) 07/19/2022   Pancreatitis unk   Primary lung adenocarcinoma (HCC) 08/05/2022   Suicidal ideation    Type 2 diabetes mellitus with hyperglycemia,  without long-term current use of insulin (HCC) 06/13/2022   Formatting of this note might be different from the original. 06/13/2022 A1C 13.8, FSBG 414 Start empagliflozin 5mg /metformin 1000 daily, levemir 20 qhs. (Samples given) Will apply for medassist for pharmacy    ALLERGIES:  is allergic to penicillins and percocet [oxycodone-acetaminophen].  MEDICATIONS:  Current Outpatient Medications  Medication Sig Dispense Refill   Accu-Chek Softclix Lancets lancets Use to check blood sugar 3 times daily. E11.69 100 each 6   atorvastatin (LIPITOR) 40 MG tablet Take 1 tablet (40 mg total) by mouth daily. 90 tablet 1   benzonatate (TESSALON) 100 MG capsule Take 1 capsule (100 mg total) by mouth 2 (two) times daily as needed. For cough 30 capsule 2   blood glucose meter kit and supplies KIT Dispense based on patient and insurance preference. Use up to four times daily as directed. 1 each 0   Blood Glucose Monitoring Suppl (ACCU-CHEK GUIDE) w/Device KIT Use to check blood sugar 3 times daily. E11.69 1 kit 0   buPROPion (WELLBUTRIN XL) 150 MG 24 hr tablet Take 1 tablet (150 mg total) by mouth daily. 90 tablet 1   empagliflozin (JARDIANCE) 25 MG TABS tablet Take 1 tablet (25 mg total) by mouth daily before breakfast. 90 tablet 1   folic acid (FOLVITE) 1 MG tablet Take 1 tablet (1 mg total) by mouth daily. 30 tablet 4   gabapentin (NEURONTIN) 300 MG capsule Take 2 capsules (600 mg total) by mouth 3 (three) times daily. 180 capsule 1   glucose blood (ACCU-CHEK GUIDE) test strip Use to check blood sugar 3 times daily. E11.69 100 each 6   insulin glargine (LANTUS SOLOSTAR) 100 UNIT/ML Solostar Pen Inject 50 Units into the skin daily. 15 mL 11   Insulin Pen Needle 32G X 4 MM MISC Use with Insulin pen 100 each 3   ipratropium-albuterol (DUONEB) 0.5-2.5 (3) MG/3ML SOLN Take 3 mLs by nebulization every 6 (six) hours as needed (Shortness of breath). 360 mL 0   lidocaine-prilocaine (EMLA) cream Apply to the  Port-A-Cath site 30-60 minutes before chemo. 30 g 0   Oxycodone HCl 10 MG TABS Take 1 tablet (10 mg) by mouth every 6 hours as needed for severe pain. 60 tablet 0   prochlorperazine (COMPAZINE) 10 MG tablet Take 1 tablet (10 mg total) by mouth every 6 (six) hours as needed for nausea or vomiting. 30 tablet 0   sildenafil (VIAGRA) 100 MG tablet Take 1/2 - 1 tablet by mouth daily as needed for erectile dysfunction. 15 tablet 11   tiZANidine (ZANAFLEX) 2 MG tablet Take 1 tablet (2 mg) by mouth at bedtime. 30 tablet 1   No current facility-administered medications for this visit.    SURGICAL HISTORY:  Past Surgical History:  Procedure Laterality Date   IR BONE TUMOR(S)RF ABLATION  08/01/2022   IR IMAGING GUIDED PORT INSERTION  09/21/2022   IR KYPHO LUMBAR INC FX REDUCE BONE BX UNI/BIL CANNULATION INC/IMAGING  08/01/2022    REVIEW OF SYSTEMS:   Review of Systems  Constitutional: Positive for fatigue, chills, generalized weakness, and bodyaches.  Negative for chills,  fever and unexpected weight change.  HENT: Negative for mouth sores, nosebleeds, sore throat and trouble swallowing.   Eyes: Negative for eye problems and icterus.  Respiratory: Negative for cough, hemoptysis, shortness of breath and wheezing.   Cardiovascular: Negative for chest pain and leg swelling.  Gastrointestinal: Positive for diarrhea and abdominal cramping.  Negative for  constipation, nausea and vomiting.  Genitourinary: Negative for bladder incontinence, difficulty urinating, dysuria, Musculoskeletal: Positive for improved but persistent back pain with radiation down his L leg occasionally.  Negative for gait problem, neck pain and neck stiffness.  Skin: Negative for itching and rash.  Neurological: Negative for dizziness, extremity weakness, gait problem, headaches, light-headedness and seizures.  Hematological: Negative for adenopathy. Does not bruise/bleed easily.  Psychiatric/Behavioral: Negative for confusion,  depression and sleep disturbance. The patient is not nervous/anxious.     PHYSICAL EXAMINATION:  Blood pressure 116/82, pulse 80, temperature 98 F (36.7 C), temperature source Oral, resp. rate 14, height 5\' 8"  (1.727 m), weight 146 lb 14.4 oz (66.6 kg), SpO2 100%.  ECOG PERFORMANCE STATUS: 1  Physical Exam  Constitutional: Oriented to person, place, and time and thin appearing male, and in no distress. Appears uncomfortable.  HENT:  Head: Normocephalic and atraumatic.  Mouth/Throat: Oropharynx is clear and moist. No oropharyngeal exudate.  Eyes: Conjunctivae are normal. Right eye exhibits no discharge. Left eye exhibits no discharge. No scleral icterus.  Neck: Normal range of motion. Neck supple.  Cardiovascular: Normal rate, regular rhythm, normal heart sounds and intact distal pulses.   Pulmonary/Chest: Effort normal and breath sounds normal. No respiratory distress. No wheezes. No rales.  Abdominal: Soft. Bowel sounds are normal. Exhibits no distension and no mass. There is no tenderness.  Musculoskeletal: Normal range of motion. Exhibits no edema.  Lymphadenopathy:    No cervical adenopathy.  Neurological: Alert and oriented to person, place, and time. Exhibits normal muscle tone. Gait normal. Coordination normal.  Skin: Skin is warm and dry. No rash noted. Not diaphoretic. No erythema. No pallor.  Psychiatric: Mood, memory and judgment normal.  Vitals reviewed.  LABORATORY DATA: Lab Results  Component Value Date   WBC 6.4 08/28/2023   HGB 14.1 08/28/2023   HCT 40.3 08/28/2023   MCV 99.0 08/28/2023   PLT 230 08/28/2023      Chemistry      Component Value Date/Time   NA 136 08/28/2023 1138   NA 140 12/24/2020 1144   NA 142 08/02/2014 1649   K 3.8 08/28/2023 1138   K 4.2 08/02/2014 1649   CL 105 08/28/2023 1138   CL 109 (H) 08/02/2014 1649   CO2 25 08/28/2023 1138   CO2 21 08/02/2014 1649   BUN 10 08/28/2023 1138   BUN 7 12/24/2020 1144   BUN 11 08/02/2014 1649    CREATININE 0.57 (L) 08/28/2023 1138   CREATININE 0.92 08/02/2014 1649      Component Value Date/Time   CALCIUM 9.5 08/28/2023 1138   CALCIUM 8.6 08/02/2014 1649   ALKPHOS 64 08/28/2023 1138   ALKPHOS 82 08/02/2014 1649   AST 23 08/28/2023 1138   ALT 26 08/28/2023 1138   ALT 132 (H) 08/02/2014 1649   BILITOT 0.3 08/28/2023 1138       RADIOGRAPHIC STUDIES:  CT Chest W Contrast  Result Date: 08/07/2023 CLINICAL DATA:  Metastatic non-small cell lung cancer restaging * Tracking Code: BO * EXAM: CT CHEST, ABDOMEN, AND PELVIS WITH CONTRAST TECHNIQUE: Multidetector CT imaging of the chest, abdomen and pelvis was performed following the standard  protocol during bolus administration of intravenous contrast. RADIATION DOSE REDUCTION: This exam was performed according to the departmental dose-optimization program which includes automated exposure control, adjustment of the mA and/or kV according to patient size and/or use of iterative reconstruction technique. CONTRAST:  OMNIPAQUE IOHEXOL 300 MG/ML  SOLN COMPARISON:  Multiple exams, including 05/15/2023 FINDINGS: CT CHEST FINDINGS Cardiovascular: Right Port-A-Cath tip: Right atrium. Atherosclerotic calcification of the aortic arch. Mediastinum/Nodes: No pathologic adenopathy. Mild wall thickening in the distal esophagus is nonspecific but the most common cause would be esophagitis. Lungs/Pleura: Emphysema. Biapical pleuroparenchymal scarring. No findings of active malignancy in the lungs. Musculoskeletal: Remote right clavicular fracture with nonunion. Remote bilateral healed rib fractures. Mixed sclerosis and lucency in the thoracic spine most notable at the T3 vertebral level compatible with prior metastatic disease. No new or progressive metastatic lesions observed. CT ABDOMEN PELVIS FINDINGS Hepatobiliary: Stable 4 mm hypodense lesion posteriorly in the right hepatic lobe, too small to characterize, but long-stability indicates a benign lesion  such as cyst. No further imaging workup of this lesion is indicated. No biliary dilatation. Gallbladder unremarkable. Pancreas: Unremarkable Spleen: Unremarkable Adrenals/Urinary Tract: Urinary bladder wall thickening may partially be due to nondistention. Unremarkable appearance of the adrenal glands. Three punctate nonobstructive right renal calculi in the 1-2 mm diameter range. Stomach/Bowel: Unremarkable Vascular/Lymphatic: Atherosclerosis is present, including aortoiliac atherosclerotic disease. No pathologic adenopathy observed. Reproductive: Unremarkable Other: Trace free pelvic fluid is nonspecific, and tracks along the paracolic gutters and in the pelvis. Musculoskeletal: Stable appearance of vertebra plana at L2 with vertebral augmentation and 7 mm of posterior bony retropulsion contributing to substantial central narrowing of the thecal sac. Stable appearance of mixed sclerosis and lucency in portions the lumbar spine most notably in the L3 vertebral body posteriorly into the right, compatible with old metastatic lesions. No new or progressive metastatic lesions observed. IMPRESSION: 1. Stable appearance of mixed sclerosis and lucency in the thoracic and lumbar spine compatible with old metastatic lesions. No new or progressive metastatic lesions observed. 2. Stable appearance of vertebra plana at L2 with vertebral augmentation and 7 mm of posterior bony retropulsion contributing to substantial central narrowing of the thecal sac. 3. Trace free pelvic fluid is nonspecific, and tracks along the paracolic gutters and in the pelvis. 4. Mild wall thickening in the distal esophagus is nonspecific but the most common cause would be esophagitis. 5. Emphysema and aortic atherosclerosis. 6. Three punctate nonobstructive right renal calculi. Aortic Atherosclerosis (ICD10-I70.0) and Emphysema (ICD10-J43.9). Electronically Signed   By: Gaylyn Rong M.D.   On: 08/07/2023 10:10   CT ABDOMEN PELVIS W  CONTRAST  Result Date: 08/07/2023 CLINICAL DATA:  Metastatic non-small cell lung cancer restaging * Tracking Code: BO * EXAM: CT CHEST, ABDOMEN, AND PELVIS WITH CONTRAST TECHNIQUE: Multidetector CT imaging of the chest, abdomen and pelvis was performed following the standard protocol during bolus administration of intravenous contrast. RADIATION DOSE REDUCTION: This exam was performed according to the departmental dose-optimization program which includes automated exposure control, adjustment of the mA and/or kV according to patient size and/or use of iterative reconstruction technique. CONTRAST:  OMNIPAQUE IOHEXOL 300 MG/ML  SOLN COMPARISON:  Multiple exams, including 05/15/2023 FINDINGS: CT CHEST FINDINGS Cardiovascular: Right Port-A-Cath tip: Right atrium. Atherosclerotic calcification of the aortic arch. Mediastinum/Nodes: No pathologic adenopathy. Mild wall thickening in the distal esophagus is nonspecific but the most common cause would be esophagitis. Lungs/Pleura: Emphysema. Biapical pleuroparenchymal scarring. No findings of active malignancy in the lungs. Musculoskeletal: Remote right clavicular fracture with  nonunion. Remote bilateral healed rib fractures. Mixed sclerosis and lucency in the thoracic spine most notable at the T3 vertebral level compatible with prior metastatic disease. No new or progressive metastatic lesions observed. CT ABDOMEN PELVIS FINDINGS Hepatobiliary: Stable 4 mm hypodense lesion posteriorly in the right hepatic lobe, too small to characterize, but long-stability indicates a benign lesion such as cyst. No further imaging workup of this lesion is indicated. No biliary dilatation. Gallbladder unremarkable. Pancreas: Unremarkable Spleen: Unremarkable Adrenals/Urinary Tract: Urinary bladder wall thickening may partially be due to nondistention. Unremarkable appearance of the adrenal glands. Three punctate nonobstructive right renal calculi in the 1-2 mm diameter range.  Stomach/Bowel: Unremarkable Vascular/Lymphatic: Atherosclerosis is present, including aortoiliac atherosclerotic disease. No pathologic adenopathy observed. Reproductive: Unremarkable Other: Trace free pelvic fluid is nonspecific, and tracks along the paracolic gutters and in the pelvis. Musculoskeletal: Stable appearance of vertebra plana at L2 with vertebral augmentation and 7 mm of posterior bony retropulsion contributing to substantial central narrowing of the thecal sac. Stable appearance of mixed sclerosis and lucency in portions the lumbar spine most notably in the L3 vertebral body posteriorly into the right, compatible with old metastatic lesions. No new or progressive metastatic lesions observed. IMPRESSION: 1. Stable appearance of mixed sclerosis and lucency in the thoracic and lumbar spine compatible with old metastatic lesions. No new or progressive metastatic lesions observed. 2. Stable appearance of vertebra plana at L2 with vertebral augmentation and 7 mm of posterior bony retropulsion contributing to substantial central narrowing of the thecal sac. 3. Trace free pelvic fluid is nonspecific, and tracks along the paracolic gutters and in the pelvis. 4. Mild wall thickening in the distal esophagus is nonspecific but the most common cause would be esophagitis. 5. Emphysema and aortic atherosclerosis. 6. Three punctate nonobstructive right renal calculi. Aortic Atherosclerosis (ICD10-I70.0) and Emphysema (ICD10-J43.9). Electronically Signed   By: Gaylyn Rong M.D.   On: 08/07/2023 10:10     ASSESSMENT/PLAN:  This is a very pleasant 57 year old African-American male diagnosed with stage IV (T3, N2, M1 C) non-small cell lung cancer, adenocarcinoma.  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  He underwent biopsy and kyphoplasty to  the L2 and to the brain metastasis.  He was diagnosed in September 2023.   His molecular studies showed no actionable mutations and his PD-L1 expression is 90%.  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    He underwent palliative radiation to L2 compression fracture as well as a solitary brain metastasis with SRS.    He is currently being treated with palliative systemic chemotherapy with carboplatin for an AUC of 5, Alimta 500 mg per metered square, Keytruda 200 mg IV every 3 weeks.  He status post 17 cycles and he tolerated well without any concerning adverse side effects.  Starting from cycle #5 he started maintenance Alimta and Keytruda.  IV every 3 weeks  Radiation oncology is monitoring his brain MRI and lumbar spine closely every few months. This is scheduled on 06/09/23.   Labs were reviewed.  No leukocytosis.  No significant dehydration or electrolyte derangements.  The patient's vitals are within normal limits. The patient's blood sugar is elevated.  He does have uncontrolled diabetes.  The patient is well-known to me and is a reliable historian.  The patient states that he started feeling unwell and was contemplating going to the emergency room today.  He has no known  sick contacts but was in a crowded public place on Friday. He is having some chills, body aches, and diarrhea.  We discussed the options today.  Unfortunately, due to concerns with infection risk, the patient is unable to receive IV fluids in the infusion room with any pending COVID-19 or flu swabs.  Patient is feeling unwell and does not feel like he can return home for supportive care.  Therefore, using shared decision making, he would appreciate if we could get him a bed in the emergency room.  He will go to the emergency room for further workup of his diarrhea, chills, and bodyaches which they may consider C.  difficile testing and GI pathogen panel.  If he continues to have significant abdominal symptoms, they may consider CT scan of the abdomen pelvis to assess for possible immunotherapy colitis.  However, the patient is having chills and bodyaches.  He need to be checked for flu/COVID.  The patient was sent to the emergency room.  Of note the patient has his Port-A-Cath accessed.  He knows not to leave the hospital with his Port-A-Cath accessed.  We will call the emergency room to notify his nurse that he is accessed.  As long as the patient is feeling better by next week and is negative for COVID, we will tentatively reschedule his chemotherapy for next week.  He was supposed to see palliative care today but they will reschedule his appointment.  The patient was advised to call immediately if he has any concerning symptoms in the interval. The patient voices understanding of current disease status and treatment options and is in agreement with the current care plan. All questions were answered. The patient knows to call the clinic with any problems, questions or concerns. We can certainly see the patient much sooner if necessary    Orders Placed This Encounter  Procedures   CBC with Differential (Cancer Center Only)    Standing Status:   Future    Standing Expiration Date:   09/03/2024   CMP (Cancer Center only)    Standing Status:   Future    Standing Expiration Date:   09/03/2024   T4    Standing Status:   Future    Standing Expiration Date:   09/03/2024   TSH    Standing Status:   Future    Standing Expiration Date:   09/03/2024     The total time spent in the appointment was 30-39 minutes  Jaquon Gingerich L Mckenleigh Tarlton, PA-C 08/28/23

## 2023-08-23 ENCOUNTER — Other Ambulatory Visit (HOSPITAL_COMMUNITY): Payer: Self-pay

## 2023-08-25 NOTE — Progress Notes (Deleted)
Palliative Medicine [facility]  [phone] [phone]   Name: Samuel [name] Date: 08/25/2023 MRN: [id number]  DOB: March 13, 1966  Patient Care Team: [name], MD as PCP - General (Pulmonary Disease) Rema Fendt, NP as Nurse Practitioner (Nurse Practitioner) Valerie Roys, Arty Baumgartner, NP as Nurse Practitioner (Nurse Practitioner)    INTERVAL HISTORY: Samuel [name] is a 57 y.o. 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  Palliative ask to see for symptom management and goals of care.   SOCIAL HISTORY:     reports that he has been smoking cigarettes. He has never used smokeless tobacco. He reports current alcohol use of about 50.0 standard drinks of alcohol per week. He reports that he does not use drugs.  ADVANCE DIRECTIVES:  Patient does not have an advanced directives. Education and packet provided. He has expressed interest in completing. States his sister, Samuel [name] would be his designated Clinical research associate.   CODE STATUS: Full code  PAST MEDICAL HISTORY: Past Medical History:  Diagnosis Date   Alcohol use disorder, severe, dependence (HCC) 12/26/2016   Diabetes mellitus without complication (HCC)    Metastatic cancer to brain (HCC) 08/05/2022   Metastatic cancer to spine (HCC) 07/19/2022   Pancreatitis unk   Primary lung adenocarcinoma (HCC) 08/05/2022   Suicidal ideation    Type 2 diabetes mellitus with hyperglycemia, without long-term current use of insulin (HCC) 06/13/2022   Formatting of this note might be different from the original. 06/13/2022 A1C 13.8, FSBG 414 Start empagliflozin 5mg /metformin 1000 daily, levemir 20 qhs. (Samples given) Will apply for medassist for pharmacy    ALLERGIES:  is allergic to penicillins and percocet [oxycodone-acetaminophen].  MEDICATIONS:   Current Outpatient Medications  Medication Sig Dispense Refill   Accu-Chek Softclix Lancets lancets Use to check blood sugar 3 times daily. E11.69 100 each 6   atorvastatin (LIPITOR) 40 MG tablet Take 1 tablet (40 mg total) by mouth daily. 90 tablet 1   benzonatate (TESSALON) 100 MG capsule Take 1 capsule (100 mg total) by mouth 2 (two) times daily as needed. For cough 30 capsule 2   blood glucose meter kit and supplies KIT Dispense based on patient and insurance preference. Use up to four times daily as directed. 1 each 0   Blood Glucose Monitoring Suppl (ACCU-CHEK GUIDE) w/Device KIT Use to check blood sugar 3 times daily. E11.69 1 kit 0   buPROPion (WELLBUTRIN XL) 150 MG 24 hr tablet Take 1 tablet (150 mg total) by mouth daily. 90 tablet 1   empagliflozin (JARDIANCE) 25 MG TABS tablet Take 1 tablet (25 mg total) by mouth daily before breakfast. 90 tablet 1   folic acid (FOLVITE) 1 MG tablet Take 1 tablet (1 mg total) by mouth daily. 30 tablet 4   gabapentin (NEURONTIN) 300 MG capsule Take 2 capsules (600 mg total) by mouth 3 (three) times daily. 180 capsule 1   glucose blood (ACCU-CHEK GUIDE) test strip Use to check blood sugar 3 times daily. E11.69 100 each 6   insulin glargine (LANTUS SOLOSTAR) 100 UNIT/ML Solostar Pen Inject 50 Units into the skin daily. 15 mL 11   Insulin Pen Needle 32G X 4 MM MISC Use with Insulin pen 100 each 3   ipratropium-albuterol (DUONEB) 0.5-2.5 (3) MG/3ML SOLN Take 3 mLs by nebulization every 6 (six) hours as needed (Shortness of breath).  360 mL 0   lidocaine-prilocaine (EMLA) cream Apply to the Port-A-Cath site 30-60 minutes before chemo. 30 g 0   Oxycodone HCl 10 MG TABS Take 1 tablet (10 mg) by mouth every 6 hours as needed for severe pain. 60 tablet 0   prochlorperazine (COMPAZINE) 10 MG tablet Take 1 tablet (10 mg total) by mouth every 6 (six) hours as needed for nausea or vomiting. 30 tablet 0   sildenafil (VIAGRA) 100 MG tablet Take 1/2 - 1 tablet by mouth  daily as needed for erectile dysfunction. 15 tablet 11   tiZANidine (ZANAFLEX) 2 MG tablet Take 1 tablet (2 mg) by mouth at bedtime. 30 tablet 1   No current facility-administered medications for this visit.    VITAL SIGNS: There were no vitals taken for this visit. There were no vitals filed for this visit.  Estimated body mass index is 22.81 kg/m as calculated from the following:   Height as of 08/07/23: 5\' 8"  (1.727 m).   Weight as of 08/07/23: 150 lb (68 kg).   PERFORMANCE STATUS (ECOG) : 1 - Symptomatic but completely ambulatory   Physical Exam General: NAD Cardiovascular: regular rate and rhythm Pulmonary:normal breathing pattern  Extremities: no edema, no joint deformities, brace in place  Skin: no rashes Neurological: AAO x4  IMPRESSION:   Neoplasm related pain Samuel [name] reports pain is well controlled on current regimen. MS Contin has been successfully discontinued.   Oxycodone 10 mg  as needed. He is not requiring daily. Gabapentin as prescribed. Neuropathic pain improving. Taking all medications as prescribed. Discussed use of Tylenol for mild aches and pain more arthritic focused as well as remaining active. He acknowledges pain increases when he is less mobile for long periods of time.   We will continue to closely monitor.   Pain contract on file.    2.  Appetite Continues to improve.  Weight is stable at 146lbs.  Expresses appreciation of being able to eat foods that he wants enjoyed.  PLAN: MS Contin discontinued.  Patient has successfully weaned appropriately. Oxy IR 10 mg every 6 hours as needed for breakthrough pain. Not requiring daily.  Colace twice daily Gabapentin 600 mg three times daily  Appetite much improved.Weight is stable. Pain contract signed on 10/23. I will plan to see patient back in 3-4 weeks in collaboration with his other oncology appointments.   Patient expressed understanding and was in agreement with this plan. He also understands  that He can call the clinic at any time with any questions, concerns, or complaints.       Any controlled substances utilized were prescribed in the context of palliative care. PDMP has been reviewed.    Visit consisted of counseling and education dealing with the complex and emotionally intense issues of symptom management and palliative care in the setting of serious and potentially life-threatening illness.Greater than 50%  of this time was spent counseling and coordinating care related to the above assessment and plan.  Willette Alma, AGPCNP-BC  Palliative Medicine Team/[facility]  *Please note that this is a verbal dictation therefore any spelling or grammatical errors are due to the "Dragon [employer] One" system interpretation.

## 2023-08-28 ENCOUNTER — Inpatient Hospital Stay: Payer: Medicaid Other | Admitting: Nurse Practitioner

## 2023-08-28 ENCOUNTER — Ambulatory Visit: Payer: Medicaid Other

## 2023-08-28 ENCOUNTER — Observation Stay (HOSPITAL_COMMUNITY)
Admission: EM | Admit: 2023-08-28 | Discharge: 2023-08-29 | Disposition: A | Discharge status: Home / Self Care | Payer: Medicaid Other | Attending: Internal Medicine | Admitting: Internal Medicine

## 2023-08-28 ENCOUNTER — Other Ambulatory Visit: Payer: Medicaid Other

## 2023-08-28 ENCOUNTER — Inpatient Hospital Stay: Payer: Medicaid Other

## 2023-08-28 ENCOUNTER — Encounter (HOSPITAL_COMMUNITY): Payer: Self-pay

## 2023-08-28 ENCOUNTER — Inpatient Hospital Stay (HOSPITAL_BASED_OUTPATIENT_CLINIC_OR_DEPARTMENT_OTHER): Payer: Medicaid Other | Attending: Adult Health | Admitting: Physician Assistant

## 2023-08-28 ENCOUNTER — Inpatient Hospital Stay: Payer: Medicaid Other | Attending: Adult Health

## 2023-08-28 ENCOUNTER — Other Ambulatory Visit: Payer: Self-pay

## 2023-08-28 ENCOUNTER — Emergency Department (HOSPITAL_COMMUNITY): Payer: Medicaid Other

## 2023-08-28 VITALS — BP 116/82 | HR 80 | Temp 98.0°F | Resp 14 | Ht 68.0 in | Wt 146.9 lb

## 2023-08-28 DIAGNOSIS — R112 Nausea with vomiting, unspecified: Principal | ICD-10-CM

## 2023-08-28 DIAGNOSIS — C3490 Malignant neoplasm of unspecified part of unspecified bronchus or lung: Secondary | ICD-10-CM

## 2023-08-28 DIAGNOSIS — J439 Emphysema, unspecified: Secondary | ICD-10-CM | POA: Insufficient documentation

## 2023-08-28 DIAGNOSIS — Z79899 Other long term (current) drug therapy: Secondary | ICD-10-CM | POA: Insufficient documentation

## 2023-08-28 DIAGNOSIS — F17210 Nicotine dependence, cigarettes, uncomplicated: Secondary | ICD-10-CM | POA: Insufficient documentation

## 2023-08-28 DIAGNOSIS — R197 Diarrhea, unspecified: Secondary | ICD-10-CM

## 2023-08-28 DIAGNOSIS — Z7984 Long term (current) use of oral hypoglycemic drugs: Secondary | ICD-10-CM | POA: Diagnosis not present

## 2023-08-28 DIAGNOSIS — Z1152 Encounter for screening for COVID-19: Secondary | ICD-10-CM | POA: Diagnosis not present

## 2023-08-28 DIAGNOSIS — R6883 Chills (without fever): Secondary | ICD-10-CM | POA: Diagnosis not present

## 2023-08-28 DIAGNOSIS — C7931 Secondary malignant neoplasm of brain: Secondary | ICD-10-CM | POA: Insufficient documentation

## 2023-08-28 DIAGNOSIS — I700 Atherosclerosis of aorta: Secondary | ICD-10-CM | POA: Insufficient documentation

## 2023-08-28 DIAGNOSIS — R531 Weakness: Secondary | ICD-10-CM | POA: Diagnosis present

## 2023-08-28 DIAGNOSIS — F1020 Alcohol dependence, uncomplicated: Secondary | ICD-10-CM | POA: Insufficient documentation

## 2023-08-28 DIAGNOSIS — K209 Esophagitis, unspecified without bleeding: Secondary | ICD-10-CM | POA: Insufficient documentation

## 2023-08-28 DIAGNOSIS — E119 Type 2 diabetes mellitus without complications: Secondary | ICD-10-CM | POA: Insufficient documentation

## 2023-08-28 DIAGNOSIS — C3411 Malignant neoplasm of upper lobe, right bronchus or lung: Secondary | ICD-10-CM | POA: Insufficient documentation

## 2023-08-28 DIAGNOSIS — E1140 Type 2 diabetes mellitus with diabetic neuropathy, unspecified: Secondary | ICD-10-CM | POA: Diagnosis not present

## 2023-08-28 DIAGNOSIS — Z85848 Personal history of malignant neoplasm of other parts of nervous tissue: Secondary | ICD-10-CM | POA: Diagnosis not present

## 2023-08-28 DIAGNOSIS — E538 Deficiency of other specified B group vitamins: Secondary | ICD-10-CM | POA: Insufficient documentation

## 2023-08-28 DIAGNOSIS — C7800 Secondary malignant neoplasm of unspecified lung: Secondary | ICD-10-CM | POA: Insufficient documentation

## 2023-08-28 DIAGNOSIS — E1165 Type 2 diabetes mellitus with hyperglycemia: Secondary | ICD-10-CM | POA: Insufficient documentation

## 2023-08-28 DIAGNOSIS — Z5111 Encounter for antineoplastic chemotherapy: Secondary | ICD-10-CM | POA: Insufficient documentation

## 2023-08-28 DIAGNOSIS — E1142 Type 2 diabetes mellitus with diabetic polyneuropathy: Secondary | ICD-10-CM | POA: Diagnosis present

## 2023-08-28 DIAGNOSIS — N200 Calculus of kidney: Secondary | ICD-10-CM | POA: Insufficient documentation

## 2023-08-28 DIAGNOSIS — W19XXXA Unspecified fall, initial encounter: Secondary | ICD-10-CM | POA: Diagnosis not present

## 2023-08-28 DIAGNOSIS — S32029A Unspecified fracture of second lumbar vertebra, initial encounter for closed fracture: Secondary | ICD-10-CM | POA: Diagnosis present

## 2023-08-28 DIAGNOSIS — K30 Functional dyspepsia: Secondary | ICD-10-CM | POA: Insufficient documentation

## 2023-08-28 DIAGNOSIS — K529 Noninfective gastroenteritis and colitis, unspecified: Secondary | ICD-10-CM | POA: Diagnosis not present

## 2023-08-28 DIAGNOSIS — Z85841 Personal history of malignant neoplasm of brain: Secondary | ICD-10-CM | POA: Diagnosis not present

## 2023-08-28 DIAGNOSIS — Z794 Long term (current) use of insulin: Secondary | ICD-10-CM | POA: Diagnosis not present

## 2023-08-28 DIAGNOSIS — C7951 Secondary malignant neoplasm of bone: Secondary | ICD-10-CM | POA: Insufficient documentation

## 2023-08-28 LAB — CMP (CANCER CENTER ONLY)
ALT: 26 U/L (ref 0–44)
AST: 23 U/L (ref 15–41)
Albumin: 3.8 g/dL (ref 3.5–5.0)
Alkaline Phosphatase: 64 U/L (ref 38–126)
Anion gap: 6 (ref 5–15)
BUN: 10 mg/dL (ref 6–20)
CO2: 25 mmol/L (ref 22–32)
Calcium: 9.5 mg/dL (ref 8.9–10.3)
Chloride: 105 mmol/L (ref 98–111)
Creatinine: 0.57 mg/dL — ABNORMAL LOW (ref 0.61–1.24)
GFR, Estimated: >60 mL/min (ref >60)
Glucose, Bld: 319 mg/dL — ABNORMAL HIGH (ref 70–99)
Potassium: 3.8 mmol/L (ref 3.5–5.1)
Sodium: 136 mmol/L (ref 135–145)
Total Bilirubin: 0.3 mg/dL (ref 0.3–1.2)
Total Protein: 7.3 g/dL (ref 6.5–8.1)

## 2023-08-28 LAB — CBC WITH DIFFERENTIAL (CANCER CENTER ONLY)
Abs Immature Granulocytes: 0.03 10*3/uL (ref 0.00–0.07)
Basophils Absolute: 0 10*3/uL (ref 0.0–0.1)
Basophils Relative: 1 %
Eosinophils Absolute: 0.1 10*3/uL (ref 0.0–0.5)
Eosinophils Relative: 1 %
HCT: 40.3 % (ref 39.0–52.0)
Hemoglobin: 14.1 g/dL (ref 13.0–17.0)
Immature Granulocytes: 1 %
Lymphocytes Relative: 16 %
Lymphs Abs: 1 10*3/uL (ref 0.7–4.0)
MCH: 34.6 pg — ABNORMAL HIGH (ref 26.0–34.0)
MCHC: 35 g/dL (ref 30.0–36.0)
MCV: 99 fL (ref 80.0–100.0)
Monocytes Absolute: 0.5 10*3/uL (ref 0.1–1.0)
Monocytes Relative: 8 %
Neutro Abs: 4.8 10*3/uL (ref 1.7–7.7)
Neutrophils Relative %: 73 %
Platelet Count: 230 10*3/uL (ref 150–400)
RBC: 4.07 MIL/uL — ABNORMAL LOW (ref 4.22–5.81)
RDW: 13.9 % (ref 11.5–15.5)
WBC Count: 6.4 10*3/uL (ref 4.0–10.5)
nRBC: 0 % (ref 0.0–0.2)

## 2023-08-28 LAB — GLUCOSE, CAPILLARY
Glucose-Capillary: 199 mg/dL — ABNORMAL HIGH (ref 70–99)
Glucose-Capillary: 356 mg/dL — ABNORMAL HIGH (ref 70–99)

## 2023-08-28 LAB — CBC
HCT: 38.2 % — ABNORMAL LOW (ref 39.0–52.0)
Hemoglobin: 12.8 g/dL — ABNORMAL LOW (ref 13.0–17.0)
MCH: 34.1 pg — ABNORMAL HIGH (ref 26.0–34.0)
MCHC: 33.5 g/dL (ref 30.0–36.0)
MCV: 101.9 fL — ABNORMAL HIGH (ref 80.0–100.0)
Platelets: 219 10*3/uL (ref 150–400)
RBC: 3.75 MIL/uL — ABNORMAL LOW (ref 4.22–5.81)
RDW: 13.9 % (ref 11.5–15.5)
WBC: 5.7 10*3/uL (ref 4.0–10.5)
nRBC: 0 % (ref 0.0–0.2)

## 2023-08-28 LAB — CREATININE, SERUM
Creatinine, Ser: 0.36 mg/dL — ABNORMAL LOW (ref 0.61–1.24)
GFR, Estimated: >60 mL/min (ref >60)

## 2023-08-28 LAB — RESP PANEL BY RT-PCR (RSV, FLU A&B, COVID)  RVPGX2
Influenza A by PCR: NEGATIVE
Influenza B by PCR: NEGATIVE
Resp Syncytial Virus by PCR: NEGATIVE
SARS Coronavirus 2 by RT PCR: NEGATIVE

## 2023-08-28 LAB — TSH: TSH: 1.133 u[IU]/mL (ref 0.350–4.500)

## 2023-08-28 MED ORDER — KETOROLAC TROMETHAMINE 30 MG/ML IJ SOLN
30.0000 mg | Freq: Once | INTRAMUSCULAR | Status: AC
  Administered 2023-08-28: 30 mg via INTRAVENOUS
  Filled 2023-08-28: qty 1

## 2023-08-28 MED ORDER — SODIUM CHLORIDE 0.9% FLUSH
10.0000 mL | Freq: Once | INTRAVENOUS | Status: AC
  Administered 2023-08-28: 10 mL

## 2023-08-28 MED ORDER — ENOXAPARIN SODIUM 40 MG/0.4ML IJ SOSY
40.0000 mg | PREFILLED_SYRINGE | INTRAMUSCULAR | Status: DC
  Filled 2023-08-28: qty 0.4

## 2023-08-28 MED ORDER — ACETAMINOPHEN 650 MG RE SUPP: 650.0000 mg | Freq: Four times a day (QID) | RECTAL | Status: DC | PRN

## 2023-08-28 MED ORDER — LOPERAMIDE HCL 2 MG PO CAPS: 2.0000 mg | ORAL_CAPSULE | Freq: Three times a day (TID) | ORAL | Status: DC | PRN

## 2023-08-28 MED ORDER — SODIUM CHLORIDE 0.9% FLUSH
3.0000 mL | Freq: Two times a day (BID) | INTRAVENOUS | Status: DC
  Administered 2023-08-28: 3 mL via INTRAVENOUS

## 2023-08-28 MED ORDER — SODIUM CHLORIDE (PF) 0.9 % IJ SOLN
INTRAMUSCULAR | Status: AC
  Filled 2023-08-28: qty 50

## 2023-08-28 MED ORDER — ONDANSETRON HCL 4 MG/2ML IJ SOLN
4.0000 mg | Freq: Once | INTRAMUSCULAR | Status: AC
  Administered 2023-08-28: 4 mg via INTRAVENOUS
  Filled 2023-08-28: qty 2

## 2023-08-28 MED ORDER — EMPAGLIFLOZIN 25 MG PO TABS
25.0000 mg | ORAL_TABLET | Freq: Every day | ORAL | Status: DC
  Administered 2023-08-29: 25 mg via ORAL
  Filled 2023-08-28: qty 1

## 2023-08-28 MED ORDER — SODIUM CHLORIDE 0.9 % IV SOLN: INTRAVENOUS | Status: DC

## 2023-08-28 MED ORDER — INSULIN ASPART 100 UNIT/ML IJ SOLN
0.0000 [IU] | Freq: Every day | INTRAMUSCULAR | Status: DC
  Administered 2023-08-28: 4 [IU] via SUBCUTANEOUS

## 2023-08-28 MED ORDER — INSULIN NPH (HUMAN) (ISOPHANE) 100 UNIT/ML ~~LOC~~ SUSP
12.0000 [IU] | SUBCUTANEOUS | Status: AC
  Administered 2023-08-28: 12 [IU] via SUBCUTANEOUS
  Filled 2023-08-28: qty 10

## 2023-08-28 MED ORDER — IOHEXOL 300 MG/ML  SOLN
100.0000 mL | Freq: Once | INTRAMUSCULAR | Status: AC | PRN
  Administered 2023-08-28: 100 mL via INTRAVENOUS

## 2023-08-28 MED ORDER — POLYETHYLENE GLYCOL 3350 17 G PO PACK: 17.0000 g | PACK | Freq: Every day | ORAL | Status: DC | PRN

## 2023-08-28 MED ORDER — INSULIN GLARGINE-YFGN 100 UNIT/ML ~~LOC~~ SOLN
50.0000 [IU] | Freq: Every day | SUBCUTANEOUS | Status: DC
  Filled 2023-08-28: qty 0.5

## 2023-08-28 MED ORDER — SODIUM CHLORIDE 0.9 % IV BOLUS
2000.0000 mL | Freq: Once | INTRAVENOUS | Status: AC
  Administered 2023-08-28: 2000 mL via INTRAVENOUS

## 2023-08-28 MED ORDER — ACETAMINOPHEN 325 MG PO TABS: 650.0000 mg | ORAL_TABLET | Freq: Four times a day (QID) | ORAL | Status: DC | PRN

## 2023-08-28 MED ORDER — INSULIN ASPART 100 UNIT/ML IJ SOLN
0.0000 [IU] | Freq: Three times a day (TID) | INTRAMUSCULAR | Status: DC
  Administered 2023-08-29: 3 [IU] via SUBCUTANEOUS

## 2023-08-28 MED ORDER — CHLORHEXIDINE GLUCONATE CLOTH 2 % EX PADS
6.0000 | MEDICATED_PAD | Freq: Every day | CUTANEOUS | Status: DC
  Administered 2023-08-28: 6 via TOPICAL

## 2023-08-28 MED ORDER — OXYCODONE HCL 5 MG PO TABS
10.0000 mg | ORAL_TABLET | Freq: Four times a day (QID) | ORAL | Status: DC | PRN
  Administered 2023-08-28 – 2023-08-29 (×2): 10 mg via ORAL
  Filled 2023-08-28 (×2): qty 2

## 2023-08-28 MED ORDER — SODIUM CHLORIDE 0.9% FLUSH
10.0000 mL | INTRAVENOUS | Status: DC | PRN
  Administered 2023-08-29: 10 mL

## 2023-08-28 NOTE — ED Provider Notes (Signed)
Patient initially seen by Dr. Estell Harpin.  Please see his note.  I discussed the case with Dr.Goel Regarding admission.   Linwood Dibbles, MD 08/28/23 [phone]

## 2023-08-28 NOTE — Assessment & Plan Note (Addendum)
Ongoign for about 3-4 days. No fever, no luekocytosis or abd pain. Will check C diff/stool pathogen (testing pending) . Will treat with imodium as paitnet is reporting rectal irritation. No electrolyte/kidney abnormality, patinet tolerating PO hydration. Advised to drink salty and high potassium foods like lime, lemon, banana, citrus fruits. No need for more iv hydration. Patient agreeable. If doing well in AM, anticipate dc home

## 2023-08-28 NOTE — ED Provider Notes (Signed)
[place] EMERGENCY DEPARTMENT AT [facility] Provider Note   CSN: [id number] Arrival date & time: 08/28/23  1232     History {Add pertinent medical, surgical, social history, OB history to HPI:1} No chief complaint on file.   Samuel [name] is a 57 y.o. male.  Patient has metastatic lung cancer.  He has been having vomiting and diarrhea for 4 days and feels extremely fatigued.  He was seen at the cancer center today and was supposed to get chemotherapy but he was sent to the emergency department  The history is provided by the patient and medical records. No language interpreter was used.  Emesis Severity:  Moderate Timing:  Constant Quality:  Bilious material Able to tolerate:  Liquids Progression:  Worsening Chronicity:  New Relieved by:  Nothing Worsened by:  Nothing Ineffective treatments:  None tried Associated symptoms: diarrhea   Associated symptoms: no abdominal pain, no cough and no headaches        Home Medications Prior to Admission medications   Medication Sig Start Date End Date Taking? Authorizing Provider  Accu-Chek Softclix Lancets lancets Use to check blood sugar 3 times daily. E11.69 01/27/23   [name], MD  atorvastatin (LIPITOR) 40 MG tablet Take 1 tablet (40 mg total) by mouth daily. 07/12/23   [name], MD  benzonatate (TESSALON) 100 MG capsule Take 1 capsule (100 mg total) by mouth 2 (two) times daily as needed. For cough 06/05/23   Heilingoetter, Cassandra L, PA-C  blood glucose meter kit and supplies KIT Dispense based on patient and insurance preference. Use up to four times daily as directed. 08/17/22   Meredeth Ide, MD  Blood Glucose Monitoring Suppl (ACCU-CHEK GUIDE) w/Device KIT Use to check blood sugar 3 times daily. E11.69 01/27/23   [name], MD  buPROPion (WELLBUTRIN XL) 150 MG 24 hr tablet Take 1 tablet (150 mg total) by mouth daily. 07/12/23   [name], MD  empagliflozin (JARDIANCE) 25 MG TABS  tablet Take 1 tablet (25 mg total) by mouth daily before breakfast. 07/12/23   [name], MD  folic acid (FOLVITE) 1 MG tablet Take 1 tablet (1 mg total) by mouth daily. 04/05/23   Pickenpack-Cousar, Arty Baumgartner, NP  gabapentin (NEURONTIN) 300 MG capsule Take 2 capsules (600 mg total) by mouth 3 (three) times daily. 07/12/23   [name], MD  glucose blood (ACCU-CHEK GUIDE) test strip Use to check blood sugar 3 times daily. E11.69 01/27/23   [name], MD  insulin glargine (LANTUS SOLOSTAR) 100 UNIT/ML Solostar Pen Inject 50 Units into the skin daily. 07/12/23   [name], MD  Insulin Pen Needle 32G X 4 MM MISC Use with Insulin pen 07/12/23   [name], MD  ipratropium-albuterol (DUONEB) 0.5-2.5 (3) MG/3ML SOLN Take 3 mLs by nebulization every 6 (six) hours as needed (Shortness of breath). 07/12/23   [name], MD  lidocaine-prilocaine (EMLA) cream Apply to the Port-A-Cath site 30-60 minutes before chemo. 09/13/22   Si Gaul, MD  Oxycodone HCl 10 MG TABS Take 1 tablet (10 mg) by mouth every 6 hours as needed for severe pain. 08/21/23   Pickenpack-Cousar, Arty Baumgartner, NP  prochlorperazine (COMPAZINE) 10 MG tablet Take 1 tablet (10 mg total) by mouth every 6 (six) hours as needed for nausea or vomiting. 08/29/22   Si Gaul, MD  sildenafil (VIAGRA) 100 MG tablet Take 1/2 - 1 tablet by mouth daily as needed  for erectile dysfunction. 07/13/23   [name], MD  tiZANidine (ZANAFLEX) 2 MG tablet Take 1 tablet (2 mg) by mouth at bedtime. 07/11/23   Pickenpack-Cousar, Arty Baumgartner, NP      Allergies    Penicillins and Percocet [oxycodone-acetaminophen]    Review of Systems   Review of Systems  Constitutional:  Negative for appetite change and fatigue.  HENT:  Negative for congestion, ear discharge and sinus pressure.   Eyes:  Negative for discharge.  Respiratory:  Negative for cough.   Cardiovascular:  Negative for chest pain.  Gastrointestinal:   Positive for diarrhea and vomiting. Negative for abdominal pain.  Genitourinary:  Negative for frequency and hematuria.  Musculoskeletal:  Negative for back pain.  Skin:  Negative for rash.  Neurological:  Negative for seizures and headaches.  Psychiatric/Behavioral:  Negative for hallucinations.     Physical Exam Updated Vital Signs BP (!) 116/104   Pulse 85   Temp 98.2 F (36.8 C) (Oral)   Resp 18   Ht 5\' 8"  (1.727 m)   Wt 66.6 kg   SpO2 100%   BMI 22.34 kg/m  Physical Exam Vitals and nursing note reviewed.  Constitutional:      Appearance: He is well-developed.  HENT:     Head: Normocephalic.     Mouth/Throat:     Comments: Dry mucous membrane Eyes:     General: No scleral icterus.    Conjunctiva/sclera: Conjunctivae normal.  Neck:     Thyroid: No thyromegaly.  Cardiovascular:     Rate and Rhythm: Normal rate and regular rhythm.     Heart sounds: No murmur heard.    No friction rub. No gallop.  Pulmonary:     Breath sounds: No stridor. No wheezing or rales.  Chest:     Chest wall: No tenderness.  Abdominal:     General: There is no distension.     Tenderness: There is abdominal tenderness. There is no rebound.  Musculoskeletal:        General: Normal range of motion.     Cervical back: Neck supple.  Lymphadenopathy:     Cervical: No cervical adenopathy.  Skin:    Findings: No erythema or rash.  Neurological:     Mental Status: He is alert and oriented to person, place, and time.     Motor: No abnormal muscle tone.     Coordination: Coordination normal.  Psychiatric:        Behavior: Behavior normal.     ED Results / Procedures / Treatments   Labs (all labs ordered are listed, but only abnormal results are displayed) Labs Reviewed  RESP PANEL BY RT-PCR (RSV, FLU A&B, COVID)  [id number]  C DIFFICILE QUICK SCREEN W PCR REFLEX    GASTROINTESTINAL PANEL BY PCR, STOOL (REPLACES STOOL CULTURE)    EKG None  Radiology No results  found.  Procedures Procedures  {Document cardiac monitor, telemetry assessment procedure when appropriate:1}  Medications Ordered in ED Medications  sodium chloride 0.9 % bolus 2,000 mL (2,000 mLs Intravenous New Bag/Given 08/28/23 1311)  ketorolac (TORADOL) 30 MG/ML injection 30 mg (30 mg Intravenous Given 08/28/23 1319)  ondansetron (ZOFRAN) injection 4 mg (4 mg Intravenous Given 08/28/23 1319)  iohexol (OMNIPAQUE) 300 MG/ML solution 100 mL (100 mLs Intravenous Contrast Given 08/28/23 1437)    ED Course/ Medical Decision Making/ A&P  Patient with metastatic lung cancer and persistent vomiting and diarrhea with dehydration.  CT scan pending {   Click here for ABCD2, HEART  and other calculatorsREFRESH Note before signing :1}                              Medical Decision Making Amount and/or Complexity of Data Reviewed Radiology: ordered.  Risk Prescription drug management.   Patient with persistent vomiting and diarrhea abdominal cramps.  He will be admitted to medicine for hydration  {Document critical care time when appropriate:1} {Document review of labs and clinical decision tools ie heart score, Chads2Vasc2 etc:1}  {Document your independent review of radiology images, and any outside records:1} {Document your discussion with family members, caretakers, and with consultants:1} {Document social determinants of health affecting pt's care:1} {Document your decision making why or why not admission, treatments were needed:1} Final Clinical Impression(s) / ED Diagnoses Final diagnoses:  None    Rx / DC Orders ED Discharge Orders     None

## 2023-08-28 NOTE — ED Triage Notes (Signed)
Pt came in by w[facility]c from [facility]. The pt came for chemo and was not feeling well. The pt c/o fatigue, n/v, and diarrhea since Friday night. VS 98.0, P 80, R 15, B/P 116/82, O2 Sats 100%RA.

## 2023-08-28 NOTE — Inpatient Diabetes Management (Signed)
Inpatient Diabetes Program Recommendations  AACE/ADA: New Consensus Statement on Inpatient Glycemic Control (2015)  Target Ranges:  Prepandial:   less than 140 mg/dL      Peak postprandial:   less than 180 mg/dL (1-2 hours)      Critically ill patients:  140 - 180 mg/dL   Lab Results  Component Value Date   GLUCAP 296 (H) 09/21/2022   HGBA1C 10.9 (A) 07/12/2023      Discharge Recommendations: Supply/Referral recommendations: Test strips   Use Adult Diabetes Insulin Treatment Post Discharge order set.

## 2023-08-28 NOTE — Assessment & Plan Note (Signed)
Chronic back pain. C.w. oxycodone. Not acute issue.

## 2023-08-28 NOTE — Assessment & Plan Note (Signed)
Hyperglycemia:  Patient takes lantus 50 units Q AM. However, did not take dose today. Restart in AM. Give NPH 12 units tonight. C.w. jiardiance.

## 2023-08-28 NOTE — Inpatient Diabetes Management (Addendum)
Inpatient Diabetes Program Recommendations  AACE/ADA: New Consensus Statement on Inpatient Glycemic Control (2015)  Target Ranges:  Prepandial:   less than 140 mg/dL      Peak postprandial:   less than 180 mg/dL (1-2 hours)      Critically ill patients:  140 - 180 mg/dL   Lab Results  Component Value Date   GLUCAP 296 (H) 09/21/2022   HGBA1C 10.9 (A) 07/12/2023    Review of Glycemic Control  Diabetes history: DM 2 Outpatient Diabetes medications: Jardiance 25 mg Daily, Lantus 50 units Daily (also on [place]) Current orders for Inpatient glycemic control:  None being evaluated in the ED  A1c 10.9% on 8/24, on Chemo treatments Its has been noted pt has been having hyperglycemia at home. Will need titration prior to discharge and follow up.  Inpatient Diabetes Program Recommendations:    -   Start Lantus 20 units -   Start Novolog 0-15 units tid + hs  Will see pt while here to discuss glucose trends at home and evaluate insulin needs prior to discharge.  Addendum:  Spoke with pt at bedside regarding his A1c of 10.9% on 8/24. Pt reported that was from a PCP visit that he has since increased his insulin from 40 units to 50 units Daily with some improvements in his glucose trends. However pt reports not checking his glucose consistently. His appetite has not been consistent and he had hypoglycemia about 2 weeks ago, however he runs mostly high. Encouraged PO intake with pt and to check on glucose trends. Will need to match insulin dosing to oral intake.  Pt reports that he has a meter but does not know if he has strips to go with it. Encouraged follow up with his PCP to have better control over his glucose.  Thanks,  Christena Deem RN, MSN, BC-ADM Inpatient Diabetes Coordinator Team Pager [phone] (8a-5p)

## 2023-08-28 NOTE — Plan of Care (Signed)

## 2023-08-28 NOTE — ED Notes (Signed)
ED TO INPATIENT HANDOFF REPORT  Name/Age/Gender Samuel [name] 57 y.o. male  Code Status    Code Status Orders  (From admission, onward)           Start     Ordered   08/28/23 1623  Full code  Continuous       Question:  By:  Answer:  Other   08/28/23 1623           Code Status History     Date Active Date Inactive Code Status Order ID Comments User Context   07/29/2022 0412 08/18/2022 0056 Full Code [id number]  Angie Fava, DO ED   03/10/2021 2249 03/15/2021 2254 Full Code [id number]  Maryfrances Bunnell, FNP Inpatient   03/09/2021 2256 03/10/2021 2220 Full Code [id number]  Jackelyn Poling, NP ED   03/09/2021 1320 03/09/2021 2216 Full Code [id number]  Alvira Monday, MD ED   03/20/2020 2110 03/21/2020 1957 Full Code [id number]  Kathrynn Running, MD Inpatient   12/24/2016 0001 12/28/2016 2108 Full Code [id number]  Audery Amel, MD Inpatient   12/24/2016 0001 12/24/2016 0001 Full Code [id number]  Audery Amel, MD Inpatient   12/22/2016 2057 12/23/2016 2258 Full Code [id number]  Willy Eddy, MD ED       Home/SNF/Other Home  Chief Complaint Enteritis [K52.9]  Level of Care/Admitting Diagnosis ED Disposition     ED Disposition  Admit   Condition  --   Comment  Hospital Area: [facility] [[id number]]  Level of Care: Med-Surg [16]  May place patient in observation at [facility] or [facility] if equivalent level of care is available:: No  Covid Evaluation: Confirmed COVID Negative  Diagnosis: Enteritis [789381]  Admitting Physician: Nolberto Hanlon [[id number]]  Attending Physician: Nolberto Hanlon [[id number]]          Medical History Past Medical History:  Diagnosis Date   Alcohol use disorder, severe, dependence (HCC) 12/26/2016   Diabetes mellitus without complication (HCC)    Metastatic cancer to brain (HCC) 08/05/2022   Metastatic cancer to spine (HCC) 07/19/2022   Pancreatitis unk   Primary lung adenocarcinoma (HCC) 08/05/2022   Suicidal ideation     Type 2 diabetes mellitus with hyperglycemia, without long-term current use of insulin (HCC) 06/13/2022   Formatting of this note might be different from the original. 06/13/2022 A1C 13.8, FSBG 414 Start empagliflozin 5mg /metformin 1000 daily, levemir 20 qhs. (Samples given) Will apply for medassist for pharmacy    Allergies Allergies  Allergen Reactions   Penicillins Hives   Percocet [Oxycodone-Acetaminophen] Nausea Only and Other (See Comments)    Pt states the pill is hard to swallow. States he was told it may be the coating on the tablet that he is allergic to.     IV Location/Drains/Wounds Patient Lines/Drains/Airways Status     Active Line/Drains/Airways     Name Placement date Placement time Site Days   Implanted Port 09/21/22 Right Chest 09/21/22  1521  Chest  341   Peripheral IV 08/28/23 22 G Left Antecubital 08/28/23  1437  Antecubital  less than 1            Labs/Imaging Results for orders placed or performed during the hospital encounter of 08/28/23 (from the past 48 hour(s))  Resp panel by RT-PCR (RSV, Flu A&B, Covid) Anterior Nasal Swab     Status: None   Collection Time: 08/28/23  1:25 PM   Specimen: Anterior Nasal Swab  Result Value Ref Range   SARS  Coronavirus 2 by RT PCR NEGATIVE NEGATIVE    Comment: (NOTE) SARS-CoV-2 target nucleic acids are NOT DETECTED.  The SARS-CoV-2 RNA is generally detectable in upper respiratory specimens during the acute phase of infection. The lowest concentration of SARS-CoV-2 viral copies this assay can detect is 138 copies/mL. A negative result does not preclude SARS-Cov-2 infection and should not be used as the sole basis for treatment or other patient management decisions. A negative result may occur with  improper specimen collection/handling, submission of specimen other than nasopharyngeal swab, presence of viral mutation(s) within the areas targeted by this assay, and inadequate number of viral copies(<138  copies/mL). A negative result must be combined with clinical observations, patient history, and epidemiological information. The expected result is Negative.  Fact Sheet for Patients:  BloggerCourse.com  Fact Sheet for Healthcare Providers:  SeriousBroker.it  This test is no t yet approved or cleared by the [place] FDA and  has been authorized for detection and/or diagnosis of SARS-CoV-2 by FDA under an Emergency Use Authorization (EUA). This EUA will remain  in effect (meaning this test can be used) for the duration of the COVID-19 declaration under Section 564(b)(1) of the Act, 21 U.S.C.section 360bbb-3(b)(1), unless the authorization is terminated  or revoked sooner.       Influenza A by PCR NEGATIVE NEGATIVE   Influenza B by PCR NEGATIVE NEGATIVE    Comment: (NOTE) The Xpert Xpress SARS-CoV-2/FLU/RSV plus assay is intended as an aid in the diagnosis of influenza from Nasopharyngeal swab specimens and should not be used as a sole basis for treatment. Nasal washings and aspirates are unacceptable for Xpert Xpress SARS-CoV-2/FLU/RSV testing.  Fact Sheet for Patients: BloggerCourse.com  Fact Sheet for Healthcare Providers: SeriousBroker.it  This test is not yet approved or cleared by the [place] FDA and has been authorized for detection and/or diagnosis of SARS-CoV-2 by FDA under an Emergency Use Authorization (EUA). This EUA will remain in effect (meaning this test can be used) for the duration of the COVID-19 declaration under Section 564(b)(1) of the Act, 21 U.S.C. section 360bbb-3(b)(1), unless the authorization is terminated or revoked.     Resp Syncytial Virus by PCR NEGATIVE NEGATIVE    Comment: (NOTE) Fact Sheet for Patients: BloggerCourse.com  Fact Sheet for Healthcare  Providers: SeriousBroker.it  This test is not yet approved or cleared by the [place] FDA and has been authorized for detection and/or diagnosis of SARS-CoV-2 by FDA under an Emergency Use Authorization (EUA). This EUA will remain in effect (meaning this test can be used) for the duration of the COVID-19 declaration under Section 564(b)(1) of the Act, 21 U.S.C. section 360bbb-3(b)(1), unless the authorization is terminated or revoked.  Performed at [facility], [address], [place], [place] [address]    CT ABDOMEN PELVIS W CONTRAST  Result Date: 08/28/2023 CLINICAL DATA:  Abdominal pain, fatigue, nausea, vomiting, diarrhea. Metastatic lung cancer. EXAM: CT ABDOMEN AND PELVIS WITH CONTRAST TECHNIQUE: Multidetector CT imaging of the abdomen and pelvis was performed using the standard protocol following bolus administration of intravenous contrast. RADIATION DOSE REDUCTION: This exam was performed according to the departmental dose-optimization program which includes automated exposure control, adjustment of the mA and/or kV according to patient size and/or use of iterative reconstruction technique. CONTRAST:  OMNIPAQUE IOHEXOL 300 MG/ML  SOLN COMPARISON:  None Available. FINDINGS: Lower chest: No acute abnormality. Old healed posterior left rib fractures. Hepatobiliary: Diffuse low-density throughout the liver compatible with fatty infiltration. No focal abnormality. Gallbladder  unremarkable. Pancreas: No focal abnormality or ductal dilatation. Spleen: No focal abnormality.  Normal size. Adrenals/Urinary Tract: No adrenal abnormality. No focal renal abnormality. No stones or hydronephrosis. Urinary bladder is unremarkable. Stomach/Bowel: Normal appendix. Moderate stool burden throughout the colon. Normal appendix. Stomach, large and small bowel grossly unremarkable. Vascular/Lymphatic: Diffuse aortic atherosclerosis. No evidence of aneurysm or  adenopathy. Reproductive: No visible focal abnormality. Other: No free fluid or free air. Musculoskeletal: Stable vertebra plana status post vertebroplasty at L2. Stable mixed lytic and sclerotic areas in the lumbar spine, most pronounced in L3. No new or progressive bony abnormality. IMPRESSION: No acute findings in the abdomen or pelvis. Hepatic steatosis. Stable evidence of metastatic disease in the lumbar spine. No new or progressive bony abnormality. Electronically Signed   By: Charlett Nose M.D.   On: 08/28/2023 15:45    Pending Labs Unresulted Labs (From admission, onward)     Start     Ordered   09/04/23 0500  Creatinine, serum  (enoxaparin (LOVENOX)    CrCl >/= 30 ml/min)  Weekly,   R     Comments: while on enoxaparin therapy    08/28/23 1623   08/29/23 0500  APTT  Tomorrow morning,   R        08/28/23 1623   08/29/23 0500  Protime-INR  Tomorrow morning,   R        08/28/23 1623   08/29/23 0500  Basic metabolic panel  Tomorrow morning,   R        08/28/23 1623   08/29/23 0500  CBC  Tomorrow morning,   R        08/28/23 1623   08/28/23 1623  CBC  (enoxaparin (LOVENOX)    CrCl >/= 30 ml/min)  Once,   R       Comments: Baseline for enoxaparin therapy IF NOT ALREADY DRAWN.  Notify MD if PLT < 100 K.    08/28/23 1623   08/28/23 1623  Creatinine, serum  (enoxaparin (LOVENOX)    CrCl >/= 30 ml/min)  Once,   R       Comments: Baseline for enoxaparin therapy IF NOT ALREADY DRAWN.    08/28/23 1623   08/28/23 1623  HIV Antibody (routine testing w rflx)  (HIV Antibody (Routine testing w reflex) panel)  Once,   R        08/28/23 1623   08/28/23 1321  Gastrointestinal Panel by PCR , Stool  (Gastrointestinal Panel by PCR, Stool                                                                                                                                                     **Does Not include CLOSTRIDIUM DIFFICILE testing. **If CDIFF testing is needed, place order from the "C Difficile Testing"  order set.**)  Once,   URGENT  08/28/23 1320   08/28/23 1320  C Difficile Quick Screen w PCR reflex  (C Difficile quick screen w PCR reflex panel )  Once, for 24 hours,   URGENT       References:    CDiff Information Tool   08/28/23 1320            Vitals/Pain Today's Vitals   08/28/23 1241 08/28/23 1242 08/28/23 1243 08/28/23 1500  BP:  123/81  (!) 116/104  Pulse:  69  85  Resp:  14  18  Temp:  98.2 F (36.8 C)    TempSrc:  Oral    SpO2: 100% 100%  100%  Weight:   146 lb 14.4 oz (66.6 kg)   Height:   5\' 8"  (1.727 m)   PainSc:  6       Isolation Precautions Enteric precautions (UV disinfection)  Medications Medications  enoxaparin (LOVENOX) injection 40 mg (has no administration in time range)  acetaminophen (TYLENOL) tablet 650 mg (has no administration in time range)    Or  acetaminophen (TYLENOL) suppository 650 mg (has no administration in time range)  polyethylene glycol (MIRALAX / GLYCOLAX) packet 17 g (has no administration in time range)  sodium chloride flush (NS) 0.9 % injection 3 mL (has no administration in time range)  0.9 %  sodium chloride infusion (has no administration in time range)  sodium chloride 0.9 % bolus 2,000 mL (2,000 mLs Intravenous New Bag/Given 08/28/23 1311)  ketorolac (TORADOL) 30 MG/ML injection 30 mg (30 mg Intravenous Given 08/28/23 1319)  ondansetron (ZOFRAN) injection 4 mg (4 mg Intravenous Given 08/28/23 1319)  iohexol (OMNIPAQUE) 300 MG/ML solution 100 mL (100 mLs Intravenous Contrast Given 08/28/23 1437)    Mobility walks

## 2023-08-28 NOTE — H&P (Signed)
History and Physical    Patient: Samuel [name]:[id number] DOB: 1965-12-21 DOA: 08/28/2023 DOS: the patient was seen and examined on 08/28/2023 PCP: [name], MD  Patient coming from: Home>cancer center> ER  Chief Complaint: No chief complaint on file.  HPI: Samuel [name] is a 57 y.o. male with medical history significant of known metastatic adenocarcinoma just before the weekend, for the last 3 to 4 days patient has had multiple watery loose bowel movements from ports having about 20 bowel movements a day.  He has had generalized weakness associated with that, however he has had no nausea or vomiting, and has been able to maintain oral diet including hide denies any abdominal pain or fever except for some irritation in the perirectal area due to diarrhea patient reported to the cancer center today to get his scheduled chemotherapy, however when he reported the diarrhea, he was sent to [facility], ER.  Apparently here patient had some trouble tolerating p.o. diet prompting request with the hospitalist service.  However since coming up to the floor, patient has been asking for diet and has had regular diet without any issues.  And states that in general he has had no nausea or vomiting or belly pain last several days and no trouble tolerating diet.  Patient is pending collection of stool sample for C. difficile and stool pathogen testing.   Review of Systems: As mentioned in the history of present illness. All other systems reviewed and are negative. Past Medical History:  Diagnosis Date   Alcohol use disorder, severe, dependence (HCC) 12/26/2016   Diabetes mellitus without complication (HCC)    Metastatic cancer to brain (HCC) 08/05/2022   Metastatic cancer to spine (HCC) 07/19/2022   Pancreatitis unk   Primary lung adenocarcinoma (HCC) 08/05/2022   Suicidal ideation    Type 2 diabetes mellitus with hyperglycemia, without long-term current use of insulin (HCC) 06/13/2022    Formatting of this note might be different from the original. 06/13/2022 A1C 13.8, FSBG 414 Start empagliflozin 5mg /metformin 1000 daily, levemir 20 qhs. (Samples given) Will apply for medassist for pharmacy   Past Surgical History:  Procedure Laterality Date   IR BONE TUMOR(S)RF ABLATION  08/01/2022   IR IMAGING GUIDED PORT INSERTION  09/21/2022   IR KYPHO LUMBAR INC FX REDUCE BONE BX UNI/BIL CANNULATION INC/IMAGING  08/01/2022   Social History:  reports that he has been smoking cigarettes. He has never used smokeless tobacco. He reports current alcohol use of about 50.0 standard drinks of alcohol per week. He reports that he does not use drugs.  Allergies  Allergen Reactions   Penicillins Hives   Percocet [Oxycodone-Acetaminophen] Nausea Only and Other (See Comments)    Pt states the pill is hard to swallow. States he was told it may be the coating on the tablet that he is allergic to.     History reviewed. No pertinent family history.  Prior to Admission medications   Medication Sig Start Date End Date Taking? Authorizing Provider  Accu-Chek Softclix Lancets lancets Use to check blood sugar 3 times daily. E11.69 01/27/23   [name], MD  atorvastatin (LIPITOR) 40 MG tablet Take 1 tablet (40 mg total) by mouth daily. 07/12/23   [name], MD  benzonatate (TESSALON) 100 MG capsule Take 1 capsule (100 mg total) by mouth 2 (two) times daily as needed. For cough 06/05/23   Heilingoetter, Cassandra L, PA-C  blood glucose meter kit and supplies KIT Dispense based on  patient and insurance preference. Use up to four times daily as directed. 08/17/22   Meredeth Ide, MD  Blood Glucose Monitoring Suppl (ACCU-CHEK GUIDE) w/Device KIT Use to check blood sugar 3 times daily. E11.69 01/27/23   [name], MD  buPROPion (WELLBUTRIN XL) 150 MG 24 hr tablet Take 1 tablet (150 mg total) by mouth daily. 07/12/23   [name], MD  empagliflozin (JARDIANCE) 25 MG TABS tablet Take 1  tablet (25 mg total) by mouth daily before breakfast. 07/12/23   [name], MD  folic acid (FOLVITE) 1 MG tablet Take 1 tablet (1 mg total) by mouth daily. 04/05/23   Pickenpack-Cousar, Arty Baumgartner, NP  gabapentin (NEURONTIN) 300 MG capsule Take 2 capsules (600 mg total) by mouth 3 (three) times daily. 07/12/23   [name], MD  glucose blood (ACCU-CHEK GUIDE) test strip Use to check blood sugar 3 times daily. E11.69 01/27/23   [name], MD  insulin glargine (LANTUS SOLOSTAR) 100 UNIT/ML Solostar Pen Inject 50 Units into the skin daily. 07/12/23   [name], MD  Insulin Pen Needle 32G X 4 MM MISC Use with Insulin pen 07/12/23   [name], MD  ipratropium-albuterol (DUONEB) 0.5-2.5 (3) MG/3ML SOLN Take 3 mLs by nebulization every 6 (six) hours as needed (Shortness of breath). 07/12/23   [name], MD  lidocaine-prilocaine (EMLA) cream Apply to the Port-A-Cath site 30-60 minutes before chemo. 09/13/22   Si Gaul, MD  Oxycodone HCl 10 MG TABS Take 1 tablet (10 mg) by mouth every 6 hours as needed for severe pain. 08/21/23   Pickenpack-Cousar, Arty Baumgartner, NP  prochlorperazine (COMPAZINE) 10 MG tablet Take 1 tablet (10 mg total) by mouth every 6 (six) hours as needed for nausea or vomiting. 08/29/22   Si Gaul, MD  sildenafil (VIAGRA) 100 MG tablet Take 1/2 - 1 tablet by mouth daily as needed for erectile dysfunction. 07/13/23   [name], MD  tiZANidine (ZANAFLEX) 2 MG tablet Take 1 tablet (2 mg) by mouth at bedtime. 07/11/23   Pickenpack-CousarArty Baumgartner, NP    Physical Exam: Vitals:   08/28/23 1242 08/28/23 1243 08/28/23 1500 08/28/23 1721  BP: 123/81  (!) 116/104 134/88  Pulse: 69  85 77  Resp: 14  18 20   Temp: 98.2 F (36.8 C)   98.5 F (36.9 C)  TempSrc: Oral     SpO2: 100%  100% 100%  Weight:  66.6 kg    Height:  5\' 8"  (1.727 m)     General: Well-built gentleman does not appear to be in distres Respiratory exam: b/l a/e  vesicular Cvs-s1s2 nromal Abd - soft non tedner Extremity warm wihtout edmea. No focal deficit. Data Reviewed:  Labs on Admission:  Results for orders placed or performed during the hospital encounter of 08/28/23 (from the past 24 hour(s))  Resp panel by RT-PCR (RSV, Flu A&B, Covid) Anterior Nasal Swab     Status: None   Collection Time: 08/28/23  1:25 PM   Specimen: Anterior Nasal Swab  Result Value Ref Range   SARS Coronavirus 2 by RT PCR NEGATIVE NEGATIVE   Influenza A by PCR NEGATIVE NEGATIVE   Influenza B by PCR NEGATIVE NEGATIVE   Resp Syncytial Virus by PCR NEGATIVE NEGATIVE  CBC     Status: Abnormal   Collection Time: 08/28/23  4:23 PM  Result Value Ref Range   WBC 5.7 4.0 - 10.5 K/uL   RBC 3.75 (L) 4.22 -  5.81 MIL/uL   Hemoglobin 12.8 (L) 13.0 - 17.0 g/dL   HCT 29.5 (L) 62.1 - 30.8 %   MCV 101.9 (H) 80.0 - 100.0 fL   MCH 34.1 (H) 26.0 - 34.0 pg   MCHC 33.5 30.0 - 36.0 g/dL   RDW 65.7 84.6 - 96.2 %   Platelets 219 150 - 400 K/uL   nRBC 0.0 0.0 - 0.2 %  Creatinine, serum     Status: Abnormal   Collection Time: 08/28/23  4:23 PM  Result Value Ref Range   Creatinine, Ser 0.36 (L) 0.61 - 1.24 mg/dL   GFR, Estimated >95 >28 mL/min  Glucose, capillary     Status: Abnormal   Collection Time: 08/28/23  5:22 PM  Result Value Ref Range   Glucose-Capillary 199 (H) 70 - 99 mg/dL   Basic Metabolic Panel: Recent Labs  Lab 08/28/23 1138 08/28/23 1623  NA 136  --   K 3.8  --   CL 105  --   CO2 25  --   GLUCOSE 319*  --   BUN 10  --   CREATININE 0.57* 0.36*  CALCIUM 9.5  --    Liver Function Tests: Recent Labs  Lab 08/28/23 1138  AST 23  ALT 26  ALKPHOS 64  BILITOT 0.3  PROT 7.3  ALBUMIN 3.8   No results for input(s): "LIPASE", "AMYLASE" in the last 168 hours. No results for input(s): "AMMONIA" in the last 168 hours. CBC: Recent Labs  Lab 08/28/23 1138 08/28/23 1623  WBC 6.4 5.7  NEUTROABS 4.8  --   HGB 14.1 12.8*  HCT 40.3 38.2*  MCV 99.0 101.9*   PLT 230 219   Cardiac Enzymes: No results for input(s): "CKTOTAL", "CKMB", "CKMBINDEX", "TROPONINIHS" in the last 168 hours.  BNP (last 3 results) No results for input(s): "PROBNP" in the last 8760 hours. CBG: Recent Labs  Lab 08/28/23 1722  GLUCAP 199*    Radiological Exams on Admission:  CT ABDOMEN PELVIS W CONTRAST  Result Date: 08/28/2023 CLINICAL DATA:  Abdominal pain, fatigue, nausea, vomiting, diarrhea. Metastatic lung cancer. EXAM: CT ABDOMEN AND PELVIS WITH CONTRAST TECHNIQUE: Multidetector CT imaging of the abdomen and pelvis was performed using the standard protocol following bolus administration of intravenous contrast. RADIATION DOSE REDUCTION: This exam was performed according to the departmental dose-optimization program which includes automated exposure control, adjustment of the mA and/or kV according to patient size and/or use of iterative reconstruction technique. CONTRAST:  OMNIPAQUE IOHEXOL 300 MG/ML  SOLN COMPARISON:  None Available. FINDINGS: Lower chest: No acute abnormality. Old healed posterior left rib fractures. Hepatobiliary: Diffuse low-density throughout the liver compatible with fatty infiltration. No focal abnormality. Gallbladder unremarkable. Pancreas: No focal abnormality or ductal dilatation. Spleen: No focal abnormality.  Normal size. Adrenals/Urinary Tract: No adrenal abnormality. No focal renal abnormality. No stones or hydronephrosis. Urinary bladder is unremarkable. Stomach/Bowel: Normal appendix. Moderate stool burden throughout the colon. Normal appendix. Stomach, large and small bowel grossly unremarkable. Vascular/Lymphatic: Diffuse aortic atherosclerosis. No evidence of aneurysm or adenopathy. Reproductive: No visible focal abnormality. Other: No free fluid or free air. Musculoskeletal: Stable vertebra plana status post vertebroplasty at L2. Stable mixed lytic and sclerotic areas in the lumbar spine, most pronounced in L3. No new or progressive  bony abnormality. IMPRESSION: No acute findings in the abdomen or pelvis. Hepatic steatosis. Stable evidence of metastatic disease in the lumbar spine. No new or progressive bony abnormality. Electronically Signed   By: Charlett Nose M.D.   On: 08/28/2023  15:45      Assessment and Plan: * Enteritis Ongoign for about 3-4 days. No fever, no luekocytosis or abd pain. Will check C diff/stool pathogen (testing pending) . Will treat with imodium as paitnet is reporting rectal irritation. No electrolyte/kidney abnormality, patinet tolerating PO hydration. Advised to drink salty and high potassium foods like lime, lemon, banana, citrus fruits. No need for more iv hydration. Patient agreeable. If doing well in AM, anticipate dc home  L2 vertebral fracture (HCC) Chronic back pain. C.w. oxycodone. Not acute issue.  DM type 2 with diabetic peripheral neuropathy (HCC) Hyperglycemia:  Patient takes lantus 50 units Q AM. However, did not take dose today. Restart in AM. Give NPH 12 units tonight. C.w. jiardiance.   Home meds ordered after discussion with aptient. Kindly review home meds collected by pharmacy dept and complete as needd.    Advance Care Planning:   Code Status: Full Code   Consults: none at this time.  Family Communication: per patietn.  Severity of Illness: The appropriate patient status for this patient is OBSERVATION. Observation status is judged to be reasonable and necessary in order to provide the required intensity of service to ensure the patient's safety. The patient's presenting symptoms, physical exam findings, and initial radiographic and laboratory data in the context of their medical condition is felt to place them at decreased risk for further clinical deterioration. Furthermore, it is anticipated that the patient will be medically stable for discharge from the hospital within 2 midnights of admission.   Author: Nolberto Hanlon, MD 08/28/2023 9:15 PM  For on call review  [email or web address].

## 2023-08-29 ENCOUNTER — Other Ambulatory Visit (HOSPITAL_COMMUNITY): Payer: Self-pay

## 2023-08-29 DIAGNOSIS — K529 Noninfective gastroenteritis and colitis, unspecified: Secondary | ICD-10-CM | POA: Diagnosis not present

## 2023-08-29 LAB — CBC
HCT: 36.1 % — ABNORMAL LOW (ref 39.0–52.0)
Hemoglobin: 12 g/dL — ABNORMAL LOW (ref 13.0–17.0)
MCH: 34.3 pg — ABNORMAL HIGH (ref 26.0–34.0)
MCHC: 33.2 g/dL (ref 30.0–36.0)
MCV: 103.1 fL — ABNORMAL HIGH (ref 80.0–100.0)
Platelets: 198 10*3/uL (ref 150–400)
RBC: 3.5 MIL/uL — ABNORMAL LOW (ref 4.22–5.81)
RDW: 13.9 % (ref 11.5–15.5)
WBC: 5.3 10*3/uL (ref 4.0–10.5)
nRBC: 0 % (ref 0.0–0.2)

## 2023-08-29 LAB — PROTIME-INR
INR: 1.1 (ref 0.8–1.2)
Prothrombin Time: 14.7 s (ref 11.4–15.2)

## 2023-08-29 LAB — APTT: aPTT: 29 s (ref 24–36)

## 2023-08-29 LAB — BASIC METABOLIC PANEL
Anion gap: 6 (ref 5–15)
BUN: 15 mg/dL (ref 6–20)
CO2: 25 mmol/L (ref 22–32)
Calcium: 8.5 mg/dL — ABNORMAL LOW (ref 8.9–10.3)
Chloride: 107 mmol/L (ref 98–111)
Creatinine, Ser: 0.47 mg/dL — ABNORMAL LOW (ref 0.61–1.24)
GFR, Estimated: >60 mL/min (ref >60)
Glucose, Bld: 88 mg/dL (ref 70–99)
Potassium: 3.5 mmol/L (ref 3.5–5.1)
Sodium: 138 mmol/L (ref 135–145)

## 2023-08-29 LAB — HEMOGLOBIN A1C
Hgb A1c MFr Bld: 8.4 % — ABNORMAL HIGH (ref 4.8–5.6)
Mean Plasma Glucose: 194.38 mg/dL

## 2023-08-29 LAB — GLUCOSE, CAPILLARY
Glucose-Capillary: 73 mg/dL (ref 70–99)
Glucose-Capillary: 161 mg/dL — ABNORMAL HIGH (ref 70–99)

## 2023-08-29 LAB — HIV ANTIBODY (ROUTINE TESTING W REFLEX): HIV Screen 4th Generation wRfx: NONREACTIVE

## 2023-08-29 LAB — T4: T4, Total: 9.2 ug/dL (ref 4.5–12.0)

## 2023-08-29 MED ORDER — HEPARIN SOD (PORK) LOCK FLUSH 100 UNIT/ML IV SOLN
500.0000 [IU] | INTRAVENOUS | Status: AC | PRN
  Administered 2023-08-29: 500 [IU]

## 2023-08-29 MED ORDER — POLYETHYLENE GLYCOL 3350 17 GM/SCOOP PO POWD
17.0000 g | Freq: Every day | ORAL | 0 refills | Status: DC | PRN
  Filled 2023-08-29: qty 238, 14d supply, fill #0

## 2023-08-29 NOTE — Plan of Care (Signed)
  Problem: Education: Goal: Knowledge of General Education information will improve Description: Including pain rating scale, medication(s)/side effects and non-pharmacologic comfort measures Outcome: Progressing   Problem: Health Behavior/Discharge Planning: Goal: Ability to manage health-related needs will improve Outcome: Progressing   Problem: Clinical Measurements: Goal: Ability to maintain clinical measurements within normal limits will improve Outcome: Progressing Goal: Will remain free from infection Outcome: Progressing Goal: Diagnostic test results will improve Outcome: Progressing Goal: Respiratory complications will improve Outcome: Progressing Goal: Cardiovascular complication will be avoided Outcome: Progressing   Problem: Activity: Goal: Risk for activity intolerance will decrease Outcome: Progressing   Problem: Nutrition: Goal: Adequate nutrition will be maintained Outcome: Progressing   Problem: Coping: Goal: Level of anxiety will decrease Outcome: Progressing   Problem: Elimination: Goal: Will not experience complications related to bowel motility Outcome: Progressing Goal: Will not experience complications related to urinary retention Outcome: Progressing   Problem: Pain Managment: Goal: General experience of comfort will improve Outcome: Progressing   Problem: Safety: Goal: Ability to remain free from injury will improve Outcome: Progressing   Problem: Skin Integrity: Goal: Risk for impaired skin integrity will decrease Outcome: Progressing   Problem: Education: Goal: Ability to describe self-care measures that may prevent or decrease complications (Diabetes Survival Skills Education) will improve Outcome: Progressing Goal: Individualized Educational Video(s) Outcome: Progressing   Problem: Coping: Goal: Ability to adjust to condition or change in health will improve Outcome: Progressing   Problem: Fluid Volume: Goal: Ability to  maintain a balanced intake and output will improve Outcome: Progressing   Problem: Health Behavior/Discharge Planning: Goal: Ability to identify and utilize available resources and services will improve Outcome: Progressing Goal: Ability to manage health-related needs will improve Outcome: Progressing   Problem: Metabolic: Goal: Ability to maintain appropriate glucose levels will improve Outcome: Progressing   Problem: Nutritional: Goal: Maintenance of adequate nutrition will improve Outcome: Progressing Goal: Progress toward achieving an optimal weight will improve Outcome: Progressing   Problem: Skin Integrity: Goal: Risk for impaired skin integrity will decrease Outcome: Progressing   Problem: Tissue Perfusion: Goal: Adequacy of tissue perfusion will improve Outcome: Progressing   Problem: Education: Goal: Ability to describe self-care measures that may prevent or decrease complications (Diabetes Survival Skills Education) will improve Outcome: Progressing Goal: Individualized Educational Video(s) Outcome: Progressing   Problem: Coping: Goal: Ability to adjust to condition or change in health will improve Outcome: Progressing   Problem: Fluid Volume: Goal: Ability to maintain a balanced intake and output will improve Outcome: Progressing   Problem: Health Behavior/Discharge Planning: Goal: Ability to identify and utilize available resources and services will improve Outcome: Progressing Goal: Ability to manage health-related needs will improve Outcome: Progressing   Problem: Metabolic: Goal: Ability to maintain appropriate glucose levels will improve Outcome: Progressing   Problem: Nutritional: Goal: Maintenance of adequate nutrition will improve Outcome: Progressing Goal: Progress toward achieving an optimal weight will improve Outcome: Progressing   Problem: Skin Integrity: Goal: Risk for impaired skin integrity will decrease Outcome: Progressing    Problem: Tissue Perfusion: Goal: Adequacy of tissue perfusion will improve Outcome: Progressing   

## 2023-08-29 NOTE — Progress Notes (Addendum)
Patient is alert and oriented x 4, c/o shaking feeling. Blood sugar checked, 73. Patient denies any other symptoms at this time. Patient offered orange juice and sandwich. Bed is in lowest position, call bell within reach, side rails up x 2.

## 2023-08-29 NOTE — Progress Notes (Signed)
Patient discharged home with no needs. Patient is alert and oriented. AVS was given and explained. Port was deaccessed by IV English as a second language teacher. Belongings were packed and sent home with the patient. Patient sister at bedside to transport patient home. Pt opted to ambulate to main entrance for discharge.

## 2023-08-29 NOTE — Discharge Summary (Signed)
Physician Discharge Summary  Samuel [name] ZOX:[id number] DOB: 1965/12/14 DOA: 08/28/2023  PCP: [name], MD  Admit date: 08/28/2023 Discharge date: 08/29/2023  Admitted From: Home Disposition: Home  Recommendations for Outpatient Follow-up:  Follow up with PCP in 1-2 weeks Please obtain BMP/CBC in one week  Home Health: None Equipment/Devices: None  Discharge Condition: Stable CODE STATUS: Full code Diet recommendation: Cardiac  Brief/Interim Summary:   57 y.o. male with medical history significant of known metastatic adenocarcinoma just before the weekend, for the last 3 to 4 days patient has had multiple watery loose bowel movements from ports having about 20 bowel movements a day, associated with generalized weakness nausea and vomiting and diarrhea.  He was admitted for gastroenteritis and IV hydration.    Discharge Diagnoses:  Principal Problem:   Enteritis Active Problems:   DM type 2 with diabetic peripheral neuropathy (HCC)   L2 vertebral fracture (HCC)  Enteritis-he was treated with IV fluids and symptomatic treatments.  He was able to tolerate a diet prior to discharge.  He did not have any nausea vomiting or diarrhea since hospital admission.  No stool studies were collected because he did not have any diarrhea.  L2 vertebral fracture (HCC) continue outpatient oxycodone   DM type 2 with diabetic peripheral neuropathy (HCC) Hyperglycemia: Continue Lantus and Jardiance  Estimated body mass index is 22.34 kg/m as calculated from the following:   Height as of this encounter: 5\' 8"  (1.727 m).   Weight as of this encounter: 66.6 kg.  Discharge Instructions  Discharge Instructions     Diet - low sodium heart healthy   Complete by: As directed    Increase activity slowly   Complete by: As directed       Allergies as of 08/29/2023       Reactions   Penicillins Hives   Percocet [oxycodone-acetaminophen] Nausea Only, Other (See Comments)   Pt  states the pill is hard to swallow. States he was told it may be the coating on the tablet that he is allergic to.         Medication List     STOP taking these medications    benzonatate 100 MG capsule Commonly known as: TESSALON   ipratropium-albuterol 0.5-2.5 (3) MG/3ML Soln Commonly known as: DUONEB   lidocaine-prilocaine cream Commonly known as: EMLA   prochlorperazine 10 MG tablet Commonly known as: COMPAZINE       TAKE these medications    Accu-Chek Guide test strip Generic drug: glucose blood Use to check blood sugar 3 times daily. E11.69   Accu-Chek Softclix Lancets lancets Use to check blood sugar 3 times daily. E11.69   acetaminophen 500 MG tablet Commonly known as: TYLENOL Take 1,000 mg by mouth in the morning and at bedtime.   atorvastatin 40 MG tablet Commonly known as: LIPITOR Take 1 tablet (40 mg total) by mouth daily.   buPROPion 150 MG 24 hr tablet Commonly known as: Wellbutrin XL Take 1 tablet (150 mg total) by mouth daily.   folic acid 1 MG tablet Commonly known as: FOLVITE Take 1 tablet (1 mg total) by mouth daily.   gabapentin 300 MG capsule Commonly known as: NEURONTIN Take 2 capsules (600 mg total) by mouth 3 (three) times daily.   Jardiance 25 MG Tabs tablet Generic drug: empagliflozin Take 1 tablet (25 mg total) by mouth daily before breakfast.   Lantus SoloStar 100 UNIT/ML Solostar Pen Generic drug: insulin glargine Inject 50 Units into the skin daily.  Oxycodone HCl 10 MG Tabs Take 1 tablet (10 mg) by mouth every 6 hours as needed for severe pain. What changed:  when to take this reasons to take this   polyethylene glycol powder 17 GM/SCOOP powder Commonly known as: GLYCOLAX/MIRALAX Take 17 g by mouth daily as needed for mild constipation.   sildenafil 100 MG tablet Commonly known as: Viagra Take 1/2 - 1 tablet by mouth daily as needed for erectile dysfunction. What changed:  how much to take when to take  this   TechLite Plus Pen Needles 32G X 4 MM Misc Generic drug: Insulin Pen Needle Use with Insulin pen   tiZANidine 2 MG tablet Commonly known as: ZANAFLEX Take 1 tablet (2 mg) by mouth at bedtime.        Follow-up Information     [name], MD Follow up.   Specialty: Pulmonary Disease Contact information: [address] [place] [address] [phone]                Allergies  Allergen Reactions   Penicillins Hives   Percocet [Oxycodone-Acetaminophen] Nausea Only and Other (See Comments)    Pt states the pill is hard to swallow. States he was told it may be the coating on the tablet that he is allergic to.     Consultations: None   Procedures/Studies: CT ABDOMEN PELVIS W CONTRAST  Result Date: 08/28/2023 CLINICAL DATA:  Abdominal pain, fatigue, nausea, vomiting, diarrhea. Metastatic lung cancer. EXAM: CT ABDOMEN AND PELVIS WITH CONTRAST TECHNIQUE: Multidetector CT imaging of the abdomen and pelvis was performed using the standard protocol following bolus administration of intravenous contrast. RADIATION DOSE REDUCTION: This exam was performed according to the departmental dose-optimization program which includes automated exposure control, adjustment of the mA and/or kV according to patient size and/or use of iterative reconstruction technique. CONTRAST:  OMNIPAQUE IOHEXOL 300 MG/ML  SOLN COMPARISON:  None Available. FINDINGS: Lower chest: No acute abnormality. Old healed posterior left rib fractures. Hepatobiliary: Diffuse low-density throughout the liver compatible with fatty infiltration. No focal abnormality. Gallbladder unremarkable. Pancreas: No focal abnormality or ductal dilatation. Spleen: No focal abnormality.  Normal size. Adrenals/Urinary Tract: No adrenal abnormality. No focal renal abnormality. No stones or hydronephrosis. Urinary bladder is unremarkable. Stomach/Bowel: Normal appendix. Moderate stool burden throughout the  colon. Normal appendix. Stomach, large and small bowel grossly unremarkable. Vascular/Lymphatic: Diffuse aortic atherosclerosis. No evidence of aneurysm or adenopathy. Reproductive: No visible focal abnormality. Other: No free fluid or free air. Musculoskeletal: Stable vertebra plana status post vertebroplasty at L2. Stable mixed lytic and sclerotic areas in the lumbar spine, most pronounced in L3. No new or progressive bony abnormality. IMPRESSION: No acute findings in the abdomen or pelvis. Hepatic steatosis. Stable evidence of metastatic disease in the lumbar spine. No new or progressive bony abnormality. Electronically Signed   By: Charlett Nose M.D.   On: 08/28/2023 15:45   CT Chest W Contrast  Result Date: 08/07/2023 CLINICAL DATA:  Metastatic non-small cell lung cancer restaging * Tracking Code: BO * EXAM: CT CHEST, ABDOMEN, AND PELVIS WITH CONTRAST TECHNIQUE: Multidetector CT imaging of the chest, abdomen and pelvis was performed following the standard protocol during bolus administration of intravenous contrast. RADIATION DOSE REDUCTION: This exam was performed according to the departmental dose-optimization program which includes automated exposure control, adjustment of the mA and/or kV according to patient size and/or use of iterative reconstruction technique. CONTRAST:  OMNIPAQUE IOHEXOL 300 MG/ML  SOLN COMPARISON:  Multiple exams, including  05/15/2023 FINDINGS: CT CHEST FINDINGS Cardiovascular: Right Port-A-Cath tip: Right atrium. Atherosclerotic calcification of the aortic arch. Mediastinum/Nodes: No pathologic adenopathy. Mild wall thickening in the distal esophagus is nonspecific but the most common cause would be esophagitis. Lungs/Pleura: Emphysema. Biapical pleuroparenchymal scarring. No findings of active malignancy in the lungs. Musculoskeletal: Remote right clavicular fracture with nonunion. Remote bilateral healed rib fractures. Mixed sclerosis and lucency in the thoracic spine most  notable at the T3 vertebral level compatible with prior metastatic disease. No new or progressive metastatic lesions observed. CT ABDOMEN PELVIS FINDINGS Hepatobiliary: Stable 4 mm hypodense lesion posteriorly in the right hepatic lobe, too small to characterize, but long-stability indicates a benign lesion such as cyst. No further imaging workup of this lesion is indicated. No biliary dilatation. Gallbladder unremarkable. Pancreas: Unremarkable Spleen: Unremarkable Adrenals/Urinary Tract: Urinary bladder wall thickening may partially be due to nondistention. Unremarkable appearance of the adrenal glands. Three punctate nonobstructive right renal calculi in the 1-2 mm diameter range. Stomach/Bowel: Unremarkable Vascular/Lymphatic: Atherosclerosis is present, including aortoiliac atherosclerotic disease. No pathologic adenopathy observed. Reproductive: Unremarkable Other: Trace free pelvic fluid is nonspecific, and tracks along the paracolic gutters and in the pelvis. Musculoskeletal: Stable appearance of vertebra plana at L2 with vertebral augmentation and 7 mm of posterior bony retropulsion contributing to substantial central narrowing of the thecal sac. Stable appearance of mixed sclerosis and lucency in portions the lumbar spine most notably in the L3 vertebral body posteriorly into the right, compatible with old metastatic lesions. No new or progressive metastatic lesions observed. IMPRESSION: 1. Stable appearance of mixed sclerosis and lucency in the thoracic and lumbar spine compatible with old metastatic lesions. No new or progressive metastatic lesions observed. 2. Stable appearance of vertebra plana at L2 with vertebral augmentation and 7 mm of posterior bony retropulsion contributing to substantial central narrowing of the thecal sac. 3. Trace free pelvic fluid is nonspecific, and tracks along the paracolic gutters and in the pelvis. 4. Mild wall thickening in the distal esophagus is nonspecific but the  most common cause would be esophagitis. 5. Emphysema and aortic atherosclerosis. 6. Three punctate nonobstructive right renal calculi. Aortic Atherosclerosis (ICD10-I70.0) and Emphysema (ICD10-J43.9). Electronically Signed   By: Gaylyn Rong M.D.   On: 08/07/2023 10:10   CT ABDOMEN PELVIS W CONTRAST  Result Date: 08/07/2023 CLINICAL DATA:  Metastatic non-small cell lung cancer restaging * Tracking Code: BO * EXAM: CT CHEST, ABDOMEN, AND PELVIS WITH CONTRAST TECHNIQUE: Multidetector CT imaging of the chest, abdomen and pelvis was performed following the standard protocol during bolus administration of intravenous contrast. RADIATION DOSE REDUCTION: This exam was performed according to the departmental dose-optimization program which includes automated exposure control, adjustment of the mA and/or kV according to patient size and/or use of iterative reconstruction technique. CONTRAST:  OMNIPAQUE IOHEXOL 300 MG/ML  SOLN COMPARISON:  Multiple exams, including 05/15/2023 FINDINGS: CT CHEST FINDINGS Cardiovascular: Right Port-A-Cath tip: Right atrium. Atherosclerotic calcification of the aortic arch. Mediastinum/Nodes: No pathologic adenopathy. Mild wall thickening in the distal esophagus is nonspecific but the most common cause would be esophagitis. Lungs/Pleura: Emphysema. Biapical pleuroparenchymal scarring. No findings of active malignancy in the lungs. Musculoskeletal: Remote right clavicular fracture with nonunion. Remote bilateral healed rib fractures. Mixed sclerosis and lucency in the thoracic spine most notable at the T3 vertebral level compatible with prior metastatic disease. No new or progressive metastatic lesions observed. CT ABDOMEN PELVIS FINDINGS Hepatobiliary: Stable 4 mm hypodense lesion posteriorly in the right hepatic lobe, too small to characterize, but long-stability  indicates a benign lesion such as cyst. No further imaging workup of this lesion is indicated. No biliary dilatation.  Gallbladder unremarkable. Pancreas: Unremarkable Spleen: Unremarkable Adrenals/Urinary Tract: Urinary bladder wall thickening may partially be due to nondistention. Unremarkable appearance of the adrenal glands. Three punctate nonobstructive right renal calculi in the 1-2 mm diameter range. Stomach/Bowel: Unremarkable Vascular/Lymphatic: Atherosclerosis is present, including aortoiliac atherosclerotic disease. No pathologic adenopathy observed. Reproductive: Unremarkable Other: Trace free pelvic fluid is nonspecific, and tracks along the paracolic gutters and in the pelvis. Musculoskeletal: Stable appearance of vertebra plana at L2 with vertebral augmentation and 7 mm of posterior bony retropulsion contributing to substantial central narrowing of the thecal sac. Stable appearance of mixed sclerosis and lucency in portions the lumbar spine most notably in the L3 vertebral body posteriorly into the right, compatible with old metastatic lesions. No new or progressive metastatic lesions observed. IMPRESSION: 1. Stable appearance of mixed sclerosis and lucency in the thoracic and lumbar spine compatible with old metastatic lesions. No new or progressive metastatic lesions observed. 2. Stable appearance of vertebra plana at L2 with vertebral augmentation and 7 mm of posterior bony retropulsion contributing to substantial central narrowing of the thecal sac. 3. Trace free pelvic fluid is nonspecific, and tracks along the paracolic gutters and in the pelvis. 4. Mild wall thickening in the distal esophagus is nonspecific but the most common cause would be esophagitis. 5. Emphysema and aortic atherosclerosis. 6. Three punctate nonobstructive right renal calculi. Aortic Atherosclerosis (ICD10-I70.0) and Emphysema (ICD10-J43.9). Electronically Signed   By: Gaylyn Rong M.D.   On: 08/07/2023 10:10   (Echo, Carotid, EGD, Colonoscopy, ERCP)    Subjective:  He is awake alert anxious to go home tolerated a  diet Discharge Exam: Vitals:   08/29/23 0038 08/29/23 0439  BP: 102/70 119/74  Pulse: 78 65  Resp: 17 16  Temp: 98.8 F (37.1 C) 98.1 F (36.7 C)  SpO2: 99% 100%   Vitals:   08/28/23 1721 08/28/23 2135 08/29/23 0038 08/29/23 0439  BP: 134/88 115/74 102/70 119/74  Pulse: 77 81 78 65  Resp: 20 20 17 16   Temp: 98.5 F (36.9 C) 98.2 F (36.8 C) 98.8 F (37.1 C) 98.1 F (36.7 C)  TempSrc:  Oral Oral Oral  SpO2: 100% 100% 99% 100%  Weight:      Height:        General: Pt is alert, awake, not in acute distress Cardiovascular: RRR, S1/S2 +, no rubs, no gallops Respiratory: CTA bilaterally, no wheezing, no rhonchi Abdominal: Soft, NT, ND, bowel sounds + Extremities: no edema, no cyanosis    The results of significant diagnostics from this hospitalization (including imaging, microbiology, ancillary and laboratory) are listed below for reference.     Microbiology: Recent Results (from the past 240 hour(s))  Resp panel by RT-PCR (RSV, Flu A&B, Covid) Anterior Nasal Swab     Status: None   Collection Time: 08/28/23  1:25 PM   Specimen: Anterior Nasal Swab  Result Value Ref Range Status   SARS Coronavirus 2 by RT PCR NEGATIVE NEGATIVE Final    Comment: (NOTE) SARS-CoV-2 target nucleic acids are NOT DETECTED.  The SARS-CoV-2 RNA is generally detectable in upper respiratory specimens during the acute phase of infection. The lowest concentration of SARS-CoV-2 viral copies this assay can detect is 138 copies/mL. A negative result does not preclude SARS-Cov-2 infection and should not be used as the sole basis for treatment or other patient management decisions. A negative result may occur with  improper specimen collection/handling, submission of specimen other than nasopharyngeal swab, presence of viral mutation(s) within the areas targeted by this assay, and inadequate number of viral copies(<138 copies/mL). A negative result must be combined with clinical observations,  patient history, and epidemiological information. The expected result is Negative.  Fact Sheet for Patients:  BloggerCourse.com  Fact Sheet for Healthcare Providers:  SeriousBroker.it  This test is no t yet approved or cleared by the [place] FDA and  has been authorized for detection and/or diagnosis of SARS-CoV-2 by FDA under an Emergency Use Authorization (EUA). This EUA will remain  in effect (meaning this test can be used) for the duration of the COVID-19 declaration under Section 564(b)(1) of the Act, 21 U.S.C.section 360bbb-3(b)(1), unless the authorization is terminated  or revoked sooner.       Influenza A by PCR NEGATIVE NEGATIVE Final   Influenza B by PCR NEGATIVE NEGATIVE Final    Comment: (NOTE) The Xpert Xpress SARS-CoV-2/FLU/RSV plus assay is intended as an aid in the diagnosis of influenza from Nasopharyngeal swab specimens and should not be used as a sole basis for treatment. Nasal washings and aspirates are unacceptable for Xpert Xpress SARS-CoV-2/FLU/RSV testing.  Fact Sheet for Patients: BloggerCourse.com  Fact Sheet for Healthcare Providers: SeriousBroker.it  This test is not yet approved or cleared by the [place] FDA and has been authorized for detection and/or diagnosis of SARS-CoV-2 by FDA under an Emergency Use Authorization (EUA). This EUA will remain in effect (meaning this test can be used) for the duration of the COVID-19 declaration under Section 564(b)(1) of the Act, 21 U.S.C. section 360bbb-3(b)(1), unless the authorization is terminated or revoked.     Resp Syncytial Virus by PCR NEGATIVE NEGATIVE Final    Comment: (NOTE) Fact Sheet for Patients: BloggerCourse.com  Fact Sheet for Healthcare Providers: SeriousBroker.it  This test is not yet approved or cleared by the [place] FDA and has been authorized for detection and/or diagnosis of SARS-CoV-2 by FDA under an Emergency Use Authorization (EUA). This EUA will remain in effect (meaning this test can be used) for the duration of the COVID-19 declaration under Section 564(b)(1) of the Act, 21 U.S.C. section 360bbb-3(b)(1), unless the authorization is terminated or revoked.  Performed at [facility], [address], [place], [place] [address]      Labs: BNP (last 3 results) No results for input(s): "BNP" in the last 8760 hours. Basic Metabolic Panel: Recent Labs  Lab 08/28/23 1138 08/28/23 1623 08/29/23 0354  NA 136  --  138  K 3.8  --  3.5  CL 105  --  107  CO2 25  --  25  GLUCOSE 319*  --  88  BUN 10  --  15  CREATININE 0.57* 0.36* 0.47*  CALCIUM 9.5  --  8.5*   Liver Function Tests: Recent Labs  Lab 08/28/23 1138  AST 23  ALT 26  ALKPHOS 64  BILITOT 0.3  PROT 7.3  ALBUMIN 3.8   No results for input(s): "LIPASE", "AMYLASE" in the last 168 hours. No results for input(s): "AMMONIA" in the last 168 hours. CBC: Recent Labs  Lab 08/28/23 1138 08/28/23 1623 08/29/23 0354  WBC 6.4 5.7 5.3  NEUTROABS 4.8  --   --   HGB 14.1 12.8* 12.0*  HCT 40.3 38.2* 36.1*  MCV 99.0 101.9* 103.1*  PLT 230 219 198   Cardiac Enzymes: No results for input(s): "CKTOTAL", "CKMB", "CKMBINDEX", "TROPONINI" in the last 168 hours. BNP: Invalid  input(s): "POCBNP" CBG: Recent Labs  Lab 08/28/23 1722 08/28/23 2143 08/29/23 0428 08/29/23 0850  GLUCAP 199* 356* 73 161*   D-Dimer No results for input(s): "DDIMER" in the last 72 hours. Hgb A1c Recent Labs    08/28/23 1623  HGBA1C 8.4*   Lipid Profile No results for input(s): "CHOL", "HDL", "LDLCALC", "TRIG", "CHOLHDL", "LDLDIRECT" in the last 72 hours. Thyroid function studies Recent Labs    08/28/23 1138  TSH 1.133  T4TOTAL 9.2   Anemia work up No results for input(s): "VITAMINB12", "FOLATE", "FERRITIN", "TIBC",  "IRON", "RETICCTPCT" in the last 72 hours. Urinalysis    Component Value Date/Time   COLORURINE YELLOW 07/28/2022 1954   APPEARANCEUR CLEAR 07/28/2022 1954   APPEARANCEUR Clear 10/23/2013 0801   LABSPEC 1.036 (H) 07/28/2022 1954   LABSPEC 1.037 10/23/2013 0801   PHURINE 5.0 07/28/2022 1954   GLUCOSEU >=500 (A) 07/28/2022 1954   GLUCOSEU >=500 10/23/2013 0801   HGBUR NEGATIVE 07/28/2022 1954   BILIRUBINUR NEGATIVE 07/28/2022 1954   BILIRUBINUR negative 02/12/2021 0941   BILIRUBINUR Negative 10/23/2013 0801   KETONESUR 80 (A) 07/28/2022 1954   PROTEINUR NEGATIVE 07/28/2022 1954   UROBILINOGEN 0.2 02/12/2021 0941   NITRITE NEGATIVE 07/28/2022 1954   LEUKOCYTESUR NEGATIVE 07/28/2022 1954   LEUKOCYTESUR Negative 10/23/2013 0801   Sepsis Labs Recent Labs  Lab 08/28/23 1138 08/28/23 1623 08/29/23 0354  WBC 6.4 5.7 5.3   Microbiology Recent Results (from the past 240 hour(s))  Resp panel by RT-PCR (RSV, Flu A&B, Covid) Anterior Nasal Swab     Status: None   Collection Time: 08/28/23  1:25 PM   Specimen: Anterior Nasal Swab  Result Value Ref Range Status   SARS Coronavirus 2 by RT PCR NEGATIVE NEGATIVE Final    Comment: (NOTE) SARS-CoV-2 target nucleic acids are NOT DETECTED.  The SARS-CoV-2 RNA is generally detectable in upper respiratory specimens during the acute phase of infection. The lowest concentration of SARS-CoV-2 viral copies this assay can detect is 138 copies/mL. A negative result does not preclude SARS-Cov-2 infection and should not be used as the sole basis for treatment or other patient management decisions. A negative result may occur with  improper specimen collection/handling, submission of specimen other than nasopharyngeal swab, presence of viral mutation(s) within the areas targeted by this assay, and inadequate number of viral copies(<138 copies/mL). A negative result must be combined with clinical observations, patient history, and  epidemiological information. The expected result is Negative.  Fact Sheet for Patients:  BloggerCourse.com  Fact Sheet for Healthcare Providers:  SeriousBroker.it  This test is no t yet approved or cleared by the [place] FDA and  has been authorized for detection and/or diagnosis of SARS-CoV-2 by FDA under an Emergency Use Authorization (EUA). This EUA will remain  in effect (meaning this test can be used) for the duration of the COVID-19 declaration under Section 564(b)(1) of the Act, 21 U.S.C.section 360bbb-3(b)(1), unless the authorization is terminated  or revoked sooner.       Influenza A by PCR NEGATIVE NEGATIVE Final   Influenza B by PCR NEGATIVE NEGATIVE Final    Comment: (NOTE) The Xpert Xpress SARS-CoV-2/FLU/RSV plus assay is intended as an aid in the diagnosis of influenza from Nasopharyngeal swab specimens and should not be used as a sole basis for treatment. Nasal washings and aspirates are unacceptable for Xpert Xpress SARS-CoV-2/FLU/RSV testing.  Fact Sheet for Patients: BloggerCourse.com  Fact Sheet for Healthcare Providers: SeriousBroker.it  This test is not yet approved or cleared by the  [place] [employer] and has been authorized for detection and/or diagnosis of SARS-CoV-2 by FDA under an TEFL teacher (EUA). This EUA will remain in effect (meaning this test can be used) for the duration of the COVID-19 declaration under Section 564(b)(1) of the Act, 21 U.S.C. section 360bbb-3(b)(1), unless the authorization is terminated or revoked.     Resp Syncytial Virus by PCR NEGATIVE NEGATIVE Final    Comment: (NOTE) Fact Sheet for Patients: BloggerCourse.com  Fact Sheet for Healthcare Providers: SeriousBroker.it  This test is not yet approved or cleared by the [place] FDA and has been  authorized for detection and/or diagnosis of SARS-CoV-2 by FDA under an Emergency Use Authorization (EUA). This EUA will remain in effect (meaning this test can be used) for the duration of the COVID-19 declaration under Section 564(b)(1) of the Act, 21 U.S.C. section 360bbb-3(b)(1), unless the authorization is terminated or revoked.  Performed at [facility], [address], [place], [place] [address]      Time coordinating discharge: 39 minutes  SIGNED:   Alwyn Ren, MD  [facility] 08/29/2023, 5:43 PM

## 2023-08-29 NOTE — TOC CM/SW Note (Signed)
Transition of Care [facility]) - Inpatient Brief Assessment   Patient Details  Name: Samuel [name] MRN: [id number] Date of Birth: 1966-03-13  Transition of Care [facility]) CM/SW Contact:    Howell Rucks, RN Phone Number: 08/29/2023, 11:04 AM   Clinical Narrative: Met with pt at bedside to introduce role of TOC/NCM and review for dc planning. Pt reports he has an established PCP and pharmacy, reports no current home care services, pt reports she has home walker that he uses as needed, pt reports he feels safe returning home with support from his sister, reports his sister will provide transportation at discharge. TOC Brief Assessment completed. No TOC needs identified at this time.     Transition of Care Asessment: Insurance and Status: Insurance coverage has been reviewed Patient has primary care physician: Yes Home environment has been reviewed: resides in private residence with his sister Prior level of function:: Independent Prior/Current Home Services: No current home services Social Determinants of Health Reivew: SDOH reviewed no interventions necessary Readmission risk has been reviewed: Yes Transition of care needs: no transition of care needs at this time

## 2023-08-30 ENCOUNTER — Telehealth: Payer: Self-pay

## 2023-08-30 NOTE — Transitions of Care (Post Inpatient/ED Visit) (Signed)
08/30/2023  Name: Samuel [name] MRN: [id number] DOB: 1966/01/09  Today's TOC FU Call Status: Today's TOC FU Call Status:: Successful TOC FU Call Completed TOC FU Call Complete Date: 08/30/23 Patient's Name and Date of Birth confirmed.  Transition Care Management Follow-up Telephone Call Date of Discharge: 08/29/23 Discharge Facility: [facility]) Type of Discharge: Inpatient Admission Primary Inpatient Discharge Diagnosis:: enteritis How have you been since you were released from the hospital?: Better Any questions or concerns?: No  Items Reviewed: Did you receive and understand the discharge instructions provided?: Yes Medications obtained,verified, and reconciled?: Partial Review Completed Reason for Partial Mediation Review: He said he has all medicaitons as well as a working glucometer and he did not have any questions about the med regime and did not need to review the med list.  He then said that the only item he needs to pick up OTC is Miralax Any new allergies since your discharge?: No Dietary orders reviewed?: Yes Type of Diet Ordered:: heart healthy, low sodium Do you have support at home?: Yes People in Home: sibling(s) Name of Support/Comfort Primary Source: currently staying with his sister  Medications Reviewed Today: Medications Reviewed Today   Medications were not reviewed in this encounter     Home Care and Equipment/Supplies: Were Home Health Services Ordered?: No Any new equipment or medical supplies ordered?: No  Functional Questionnaire: Do you need assistance with bathing/showering or dressing?: No Do you need assistance with meal preparation?: No Do you need assistance with eating?: No Do you have difficulty maintaining continence: No Do you need assistance with getting out of bed/getting out of a chair/moving?: No Do you have difficulty managing or taking your medications?: No  Follow up appointments reviewed: PCP Follow-up appointment  confirmed?: Yes Date of PCP follow-up appointment?: 10/12/23 Follow-up Provider: Dr Alvis Lemmings.  - he will need to establish care with a new PCP.  I offered to schedule him with Dr Alvis Lemmings before 11/21 bu the said he wants to leave that appointment because he wlll soon be in the process of moving. Specialist Hospital Follow-up appointment confirmed?: Yes Date of Specialist follow-up appointment?: 09/12/23 Follow-Up Specialty Provider:: MRI of brain.  09/18/2023- radiation oncology.   09/19/2023- oncology Do you need transportation to your follow-up appointment?: No Do you understand care options if your condition(s) worsen?: Yes-patient verbalized understanding    SIGNATURE Robyne Peers, RN

## 2023-08-31 ENCOUNTER — Other Ambulatory Visit (HOSPITAL_COMMUNITY): Payer: Self-pay

## 2023-08-31 NOTE — Progress Notes (Deleted)
[facility] OFFICE PROGRESS NOTE  Samuel Frisk, MD 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    Biomarker Findings Microsatellite status - MS-Stable Tumor Mutational Burden - 8 Muts/Mb Genomic Findings For a complete list of the genes assayed, please refer to the Appendix. KEAP1 Q227* CDKN2A/B p16INK4a G89V PRKCI amplification TERC amplification - equivocal? TP53 D247fs*18 8 Disease relevant genes with no reportable alterations: ALK, BRAF, EGFR, ERBB2, KRAS, MET, RET, ROS1   PDL1 TPS  90%  PRIOR THERAPY: 1) Status post palliative radiotherapy to the L2 lesion under the care of Dr. Kathrynn [name]. 2) Status post SRS to solitary brain metastasis under the care of Dr. Kathrynn [name]  CURRENT THERAPY: Palliative systemic therapy with carboplatin for AUC of 5, Alimta 500 Mg/M2 and Keytruda 200 Mg IV every 3 weeks.  First dose September 05, 2022.  Status post 17 cycles. Starting from cycle #5, he started on maintenance keytruda and alimta IV every 3 weeks.   INTERVAL HISTORY: Samuel [name] 57 y.o. male returns to clinic today for follow-up visit.  The patient was last seen by myself last week.  He was coming in for treatment that day but was feeling unwell.  He was endorsing new onset diarrhea, chills, nausea, aches.  He was subsequently seen in the emergency room.  His workup included flu, COVID, and RSV swab which was negative.  Unable to perform stool sample?  He had a CT of the abdomen pelvis which did not show any acute findings in the abdomen or pelvis.  He was admitted briefly for possible  enteritis.  He was given IV fluids.  Stool studies were not collected since he did not have any more episodes of diarrhea.  Also had some hyperglycemia.  Since last being seen he is feeling ***at this time. he denies any fever, chills, night sweats, or appetite changes. Denies any chest pain or shortness of breath.  He reports he sometimes has a cough but it is intermittent and not daily. It occurs more in the AM.  He denies any nausea, vomiting, diarrhea, or constipation. Denies any headache or visual changes.  He denies rashes or skin changes. He is here for evaluation and repeat blood work before undergoing cycle #18.   MEDICAL HISTORY: Past Medical History:  Diagnosis Date   Alcohol use disorder, severe, dependence (HCC) 12/26/2016   Diabetes mellitus without complication (HCC)    Metastatic cancer to brain (HCC) 08/05/2022   Metastatic cancer to spine (HCC) 07/19/2022   Pancreatitis unk   Primary lung adenocarcinoma (HCC) 08/05/2022   Suicidal ideation    Type 2 diabetes mellitus with hyperglycemia, without long-term current use of insulin (HCC) 06/13/2022   Formatting of this note might be different from the original. 06/13/2022 A1C 13.8, FSBG 414 Start empagliflozin 5mg /metformin 1000 daily, levemir 20 qhs. (Samples given) Will apply for medassist for pharmacy    ALLERGIES:  is allergic to penicillins and percocet [oxycodone-acetaminophen].  MEDICATIONS:  Current Outpatient Medications  Medication Sig Dispense Refill   Accu-Chek Softclix Lancets lancets Use to check blood sugar 3 times daily. E11.69 100 each 6   acetaminophen (TYLENOL) 500 MG tablet  Take 1,000 mg by mouth in the morning and at bedtime.     atorvastatin (LIPITOR) 40 MG tablet Take 1 tablet (40 mg total) by mouth daily. 90 tablet 1   buPROPion (WELLBUTRIN XL) 150 MG 24 hr tablet Take 1 tablet (150 mg total) by mouth daily. 90 tablet 1   empagliflozin (JARDIANCE) 25 MG TABS tablet Take 1 tablet (25 mg total) by mouth  daily before breakfast. 90 tablet 1   folic acid (FOLVITE) 1 MG tablet Take 1 tablet (1 mg total) by mouth daily. 30 tablet 4   gabapentin (NEURONTIN) 300 MG capsule Take 2 capsules (600 mg total) by mouth 3 (three) times daily. 180 capsule 1   glucose blood (ACCU-CHEK GUIDE) test strip Use to check blood sugar 3 times daily. E11.69 100 each 6   insulin glargine (LANTUS SOLOSTAR) 100 UNIT/ML Solostar Pen Inject 50 Units into the skin daily. 15 mL 11   Insulin Pen Needle 32G X 4 MM MISC Use with Insulin pen 100 each 3   Oxycodone HCl 10 MG TABS Take 1 tablet (10 mg) by mouth every 6 hours as needed for severe pain. (Patient taking differently: Take 10 mg by mouth 2 (two) times daily as needed (pain).) 60 tablet 0   polyethylene glycol powder (GLYCOLAX/MIRALAX) 17 GM/SCOOP powder Take 17 g by mouth daily as needed for mild constipation. 238 g 0   sildenafil (VIAGRA) 100 MG tablet Take 1/2 - 1 tablet by mouth daily as needed for erectile dysfunction. (Patient taking differently: Take 100 mg by mouth as needed for erectile dysfunction.) 15 tablet 11   tiZANidine (ZANAFLEX) 2 MG tablet Take 1 tablet (2 mg) by mouth at bedtime. 30 tablet 1   No current facility-administered medications for this visit.    SURGICAL HISTORY:  Past Surgical History:  Procedure Laterality Date   IR BONE TUMOR(S)RF ABLATION  08/01/2022   IR IMAGING GUIDED PORT INSERTION  09/21/2022   IR KYPHO LUMBAR INC FX REDUCE BONE BX UNI/BIL CANNULATION INC/IMAGING  08/01/2022    REVIEW OF SYSTEMS:   Review of Systems  Constitutional: Negative for appetite change, chills, fatigue, fever and unexpected weight change.  HENT:   Negative for mouth sores, nosebleeds, sore throat and trouble swallowing.   Eyes: Negative for eye problems and icterus.  Respiratory: Negative for cough, hemoptysis, shortness of breath and wheezing.   Cardiovascular: Negative for chest pain and leg swelling.  Gastrointestinal: Negative for abdominal pain,  constipation, diarrhea, nausea and vomiting.  Genitourinary: Negative for bladder incontinence, difficulty urinating, dysuria, frequency and hematuria.   Musculoskeletal: Negative for back pain, gait problem, neck pain and neck stiffness.  Skin: Negative for itching and rash.  Neurological: Negative for dizziness, extremity weakness, gait problem, headaches, light-headedness and seizures.  Hematological: Negative for adenopathy. Does not bruise/bleed easily.  Psychiatric/Behavioral: Negative for confusion, depression and sleep disturbance. The patient is not nervous/anxious.     PHYSICAL EXAMINATION:  There were no vitals taken for this visit.  ECOG PERFORMANCE STATUS: {CHL ONC ECOG [id number]  Physical Exam  Constitutional: Oriented to person, place, and time and well-developed, well-nourished, and in no distress. No distress.  HENT:  Head: Normocephalic and atraumatic.  Mouth/Throat: Oropharynx is clear and moist. No oropharyngeal exudate.  Eyes: Conjunctivae are normal. Right eye exhibits no discharge. Left eye exhibits no discharge. No scleral icterus.  Neck: Normal range of motion. Neck supple.  Cardiovascular: Normal rate, regular rhythm, normal heart sounds and intact distal pulses.   Pulmonary/Chest: Effort  normal and breath sounds normal. No respiratory distress. No wheezes. No rales.  Abdominal: Soft. Bowel sounds are normal. Exhibits no distension and no mass. There is no tenderness.  Musculoskeletal: Normal range of motion. Exhibits no edema.  Lymphadenopathy:    No cervical adenopathy.  Neurological: Alert and oriented to person, place, and time. Exhibits normal muscle tone. Gait normal. Coordination normal.  Skin: Skin is warm and dry. No rash noted. Not diaphoretic. No erythema. No pallor.  Psychiatric: Mood, memory and judgment normal.  Vitals reviewed.  LABORATORY DATA: Lab Results  Component Value Date   WBC 5.3 08/29/2023   HGB 12.0 (L) 08/29/2023   HCT  36.1 (L) 08/29/2023   MCV 103.1 (H) 08/29/2023   PLT 198 08/29/2023      Chemistry      Component Value Date/Time   NA 138 08/29/2023 0354   NA 140 12/24/2020 1144   NA 142 08/02/2014 1649   K 3.5 08/29/2023 0354   K 4.2 08/02/2014 1649   CL 107 08/29/2023 0354   CL 109 (H) 08/02/2014 1649   CO2 25 08/29/2023 0354   CO2 21 08/02/2014 1649   BUN 15 08/29/2023 0354   BUN 7 12/24/2020 1144   BUN 11 08/02/2014 1649   CREATININE 0.47 (L) 08/29/2023 0354   CREATININE 0.57 (L) 08/28/2023 1138   CREATININE 0.92 08/02/2014 1649      Component Value Date/Time   CALCIUM 8.5 (L) 08/29/2023 0354   CALCIUM 8.6 08/02/2014 1649   ALKPHOS 64 08/28/2023 1138   ALKPHOS 82 08/02/2014 1649   AST 23 08/28/2023 1138   ALT 26 08/28/2023 1138   ALT 132 (H) 08/02/2014 1649   BILITOT 0.3 08/28/2023 1138       RADIOGRAPHIC STUDIES:  CT ABDOMEN PELVIS W CONTRAST  Result Date: 08/28/2023 CLINICAL DATA:  Abdominal pain, fatigue, nausea, vomiting, diarrhea. Metastatic lung cancer. EXAM: CT ABDOMEN AND PELVIS WITH CONTRAST TECHNIQUE: Multidetector CT imaging of the abdomen and pelvis was performed using the standard protocol following bolus administration of intravenous contrast. RADIATION DOSE REDUCTION: This exam was performed according to the departmental dose-optimization program which includes automated exposure control, adjustment of the mA and/or kV according to patient size and/or use of iterative reconstruction technique. CONTRAST:  OMNIPAQUE IOHEXOL 300 MG/ML  SOLN COMPARISON:  None Available. FINDINGS: Lower chest: No acute abnormality. Old healed posterior left rib fractures. Hepatobiliary: Diffuse low-density throughout the liver compatible with fatty infiltration. No focal abnormality. Gallbladder unremarkable. Pancreas: No focal abnormality or ductal dilatation. Spleen: No focal abnormality.  Normal size. Adrenals/Urinary Tract: No adrenal abnormality. No focal renal abnormality. No  stones or hydronephrosis. Urinary bladder is unremarkable. Stomach/Bowel: Normal appendix. Moderate stool burden throughout the colon. Normal appendix. Stomach, large and small bowel grossly unremarkable. Vascular/Lymphatic: Diffuse aortic atherosclerosis. No evidence of aneurysm or adenopathy. Reproductive: No visible focal abnormality. Other: No free fluid or free air. Musculoskeletal: Stable vertebra plana status post vertebroplasty at L2. Stable mixed lytic and sclerotic areas in the lumbar spine, most pronounced in L3. No new or progressive bony abnormality. IMPRESSION: No acute findings in the abdomen or pelvis. Hepatic steatosis. Stable evidence of metastatic disease in the lumbar spine. No new or progressive bony abnormality. Electronically Signed   By: Charlett Nose M.D.   On: 08/28/2023 15:45     ASSESSMENT/PLAN:  This is a very pleasant 57 year old African-American male diagnosed with stage IV (T3, N2, M1 C) non-small cell lung cancer, adenocarcinoma.  He presented with a right pulmonary  nodule in addition to right hilar mediastinal lymphadenopathy and pleural effusion, metastatic disease to the brain, and extensive metastatic bone disease to the lumbar spine.  He underwent biopsy and kyphoplasty to the L2 and to the brain metastasis.  He was diagnosed in September 2023.   His molecular studies showed no actionable mutations and his PD-L1 expression is 90%.  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    He underwent palliative radiation to L2 compression fracture as well as a solitary brain metastasis with SRS.    He is currently being treated with palliative systemic chemotherapy with carboplatin for an AUC of 5, Alimta 500 mg per metered square, Keytruda 200 mg IV every 3 weeks.  He status post 17 cycles and he tolerated well without any concerning adverse side effects.  Starting  from cycle #5 he started maintenance Alimta and Keytruda.  IV every 3 weeks  Radiation oncology is monitoring his brain MRI and lumbar spine closely every few months. His next brain MRI is scheduled next week on 09/12/23.   Patient was hospitalized for enteritis last week and he is feeling ***at this time.  Labs were reviewed.  Recommend that he ***with cycle #18 today scheduled.  We will see him back for follow-up visit in 3 weeks for evaluation repeat blood work before undergoing cycle #19.  He will continue to follow with palliative care and he is scheduled to have a telephone visit later today.   The patient was advised to call immediately if he has any concerning symptoms in the interval. The patient voices understanding of current disease status and treatment options and is in agreement with the current care plan. All questions were answered. The patient knows to call the clinic with any problems, questions or concerns. We can certainly see the patient much sooner if necessary    No orders of the defined types were placed in this encounter.    I spent {CHL ONC TIME VISIT - UEAVW:[id number]} counseling the patient face to face. The total time spent in the appointment was {CHL ONC TIME VISIT - GNFAO:[id number]}.  Delane Stalling L Khaliyah Northrop, PA-C 08/31/23

## 2023-09-04 NOTE — Progress Notes (Deleted)
Palliative Medicine [facility]  [phone] [phone]   Name: Samuel [name] Date: 09/04/2023 MRN: [id number]  DOB: 08-24-1966  Patient Care Team: [name], MD as PCP - General (Pulmonary Disease) Rema Fendt, NP as Nurse Practitioner (Nurse Practitioner) Valerie Roys, Arty Baumgartner, NP as Nurse Practitioner (Nurse Practitioner)   I connected with Samuel [name] on 09/04/23 at  1:15 PM EDT by phone and verified that I am speaking with the correct person using two identifiers.   I discussed the limitations, risks, security and privacy concerns of performing an evaluation and management service by telemedicine and the availability of in-person appointments. I also discussed with the patient that there may be a patient responsible charge related to this service. The patient expressed understanding and agreed to proceed.   Other persons participating in the visit and their role in the encounter: n/a   Patient's location: home  Provider's location: [facility]   Chief Complaint: f/u of symptom management    INTERVAL HISTORY: [name] is a 57 y.o. 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  Palliative ask to see for symptom management and goals of care.   SOCIAL HISTORY:     reports that he has been smoking cigarettes. He has never used smokeless tobacco. He reports current alcohol use of about 50.0 standard drinks of alcohol per week. He reports that he does not use drugs.  ADVANCE DIRECTIVES:  Patient does not have an advanced directives. Education and packet provided. He has expressed interest in completing. States his sister, Samuel [name] would be his designated Clinical research associate.   CODE STATUS: Full code  PAST MEDICAL HISTORY: Past Medical History:  Diagnosis Date   Alcohol use  disorder, severe, dependence (HCC) 12/26/2016   Diabetes mellitus without complication (HCC)    Metastatic cancer to brain (HCC) 08/05/2022   Metastatic cancer to spine (HCC) 07/19/2022   Pancreatitis unk   Primary lung adenocarcinoma (HCC) 08/05/2022   Suicidal ideation    Type 2 diabetes mellitus with hyperglycemia, without long-term current use of insulin (HCC) 06/13/2022   Formatting of this note might be different from the original. 06/13/2022 A1C 13.8, FSBG 414 Start empagliflozin 5mg /metformin 1000 daily, levemir 20 qhs. (Samples given) Will apply for medassist for pharmacy    ALLERGIES:  is allergic to penicillins and percocet [oxycodone-acetaminophen].  MEDICATIONS:  Current Outpatient Medications  Medication Sig Dispense Refill   Accu-Chek Softclix Lancets lancets Use to check blood sugar 3 times daily. E11.69 100 each 6   acetaminophen (TYLENOL) 500 MG tablet Take 1,000 mg by mouth in the morning and at bedtime.     atorvastatin (LIPITOR) 40 MG tablet Take 1 tablet (40 mg total) by mouth daily. 90 tablet 1   buPROPion (WELLBUTRIN XL) 150 MG 24 hr tablet Take 1 tablet (150 mg total) by mouth daily. 90 tablet 1   empagliflozin (JARDIANCE) 25 MG TABS tablet Take 1 tablet (25 mg total) by mouth daily before breakfast. 90 tablet 1   folic acid (FOLVITE) 1 MG tablet Take 1 tablet (1 mg total) by mouth daily. 30 tablet 4   gabapentin (NEURONTIN) 300 MG capsule Take 2 capsules (600 mg total) by mouth 3 (three) times daily. 180 capsule 1   glucose blood (ACCU-CHEK GUIDE) test strip Use to check blood sugar 3 times daily. E11.69 100 each  6   insulin glargine (LANTUS SOLOSTAR) 100 UNIT/ML Solostar Pen Inject 50 Units into the skin daily. 15 mL 11   Insulin Pen Needle 32G X 4 MM MISC Use with Insulin pen 100 each 3   Oxycodone HCl 10 MG TABS Take 1 tablet (10 mg) by mouth every 6 hours as needed for severe pain. (Patient taking differently: Take 10 mg by mouth 2 (two) times daily as needed  (pain).) 60 tablet 0   polyethylene glycol powder (GLYCOLAX/MIRALAX) 17 GM/SCOOP powder Take 17 g by mouth daily as needed for mild constipation. 238 g 0   sildenafil (VIAGRA) 100 MG tablet Take 1/2 - 1 tablet by mouth daily as needed for erectile dysfunction. (Patient taking differently: Take 100 mg by mouth as needed for erectile dysfunction.) 15 tablet 11   tiZANidine (ZANAFLEX) 2 MG tablet Take 1 tablet (2 mg) by mouth at bedtime. 30 tablet 1   No current facility-administered medications for this visit.    VITAL SIGNS: There were no vitals taken for this visit. There were no vitals filed for this visit.  Estimated body mass index is 22.34 kg/m as calculated from the following:   Height as of 08/28/23: 5\' 8"  (1.727 m).   Weight as of 08/28/23: 146 lb 14.4 oz (66.6 kg).   PERFORMANCE STATUS (ECOG) : 1 - Symptomatic but completely ambulatory   Physical Exam General: NAD Cardiovascular: regular rate and rhythm Pulmonary:normal breathing pattern  Extremities: no edema, no joint deformities, brace in place  Skin: no rashes Neurological: AAO x4  IMPRESSION:   Neoplasm related pain Yang reports pain is well controlled on current regimen. MS Contin has been successfully discontinued.   Oxycodone 10 mg  as needed. He is not requiring daily. Gabapentin as prescribed. Neuropathic pain improving. Taking all medications as prescribed. Discussed use of Tylenol for mild aches and pain more arthritic focused as well as remaining active. He acknowledges pain increases when he is less mobile for long periods of time.   We will continue to closely monitor.   Pain contract on file.    2.  Appetite Continues to improve.  Weight is stable at 146lbs.  Expresses appreciation of being able to eat foods that he wants enjoyed.  PLAN: MS Contin discontinued.  Patient has successfully weaned appropriately. Oxy IR 10 mg every 6 hours as needed for breakthrough pain. Not requiring daily.  Colace  twice daily Gabapentin 600 mg three times daily  Appetite much improved.Weight is stable. Pain contract signed on 10/23. I will plan to see patient back in 3-4 weeks in collaboration with his other oncology appointments.   Patient expressed understanding and was in agreement with this plan. He also understands that He can call the clinic at any time with any questions, concerns, or complaints.       Any controlled substances utilized were prescribed in the context of palliative care. PDMP has been reviewed.    Visit consisted of counseling and education dealing with the complex and emotionally intense issues of symptom management and palliative care in the setting of serious and potentially life-threatening illness.Greater than 50%  of this time was spent counseling and coordinating care related to the above assessment and plan.  Willette Alma, AGPCNP-BC  Palliative Medicine Team/[facility]  *Please note that this is a verbal dictation therefore any spelling or grammatical errors are due to the "[employer] One" system interpretation.

## 2023-09-04 NOTE — Progress Notes (Deleted)
Palliative Medicine [name]  [phone] [phone]   Name: Samuel [name] [name] Date: 09/04/2023 MRN: [id number]  DOB: 10-04-1966  Patient Care Team: Samuel Frisk, MD as PCP - General (Pulmonary Disease) Samuel Fendt, NP as Nurse Practitioner (Nurse Practitioner) Samuel [name], Samuel Baumgartner, NP as Nurse Practitioner (Nurse Practitioner)   INTERVAL HISTORY: Samuel [name] is a 57 y.o. 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  Palliative ask to see for symptom management and goals of care.   SOCIAL HISTORY:     reports that he has been smoking cigarettes. He has never used smokeless tobacco. He reports current alcohol use of about 50.0 standard drinks of alcohol per week. He reports that he does not use drugs.  ADVANCE DIRECTIVES:  Patient does not have an advanced directives. Education and packet provided. He has expressed interest in completing. States his sister, Samuel [name] would be his designated Clinical research associate.   CODE STATUS: Full code  PAST MEDICAL HISTORY: Past Medical History:  Diagnosis Date   Alcohol use disorder, severe, dependence (HCC) 12/26/2016   Diabetes mellitus without complication (HCC)    Metastatic cancer to brain (HCC) 08/05/2022   Metastatic cancer to spine (HCC) 07/19/2022   Pancreatitis unk   Primary lung adenocarcinoma (HCC) 08/05/2022   Suicidal ideation    Type 2 diabetes mellitus with hyperglycemia, without long-term current use of insulin (HCC) 06/13/2022   Formatting of this note might be different from the original. 06/13/2022 A1C 13.8, FSBG 414 Start empagliflozin 5mg /metformin 1000 daily, levemir 20 qhs. (Samples given) Will apply for medassist for pharmacy    ALLERGIES:  is allergic to penicillins and percocet [oxycodone-acetaminophen].  MEDICATIONS:   Current Outpatient Medications  Medication Sig Dispense Refill   Accu-Chek Softclix Lancets lancets Use to check blood sugar 3 times daily. E11.69 100 each 6   acetaminophen (TYLENOL) 500 MG tablet Take 1,000 mg by mouth in the morning and at bedtime.     atorvastatin (LIPITOR) 40 MG tablet Take 1 tablet (40 mg total) by mouth daily. 90 tablet 1   buPROPion (WELLBUTRIN XL) 150 MG 24 hr tablet Take 1 tablet (150 mg total) by mouth daily. 90 tablet 1   empagliflozin (JARDIANCE) 25 MG TABS tablet Take 1 tablet (25 mg total) by mouth daily before breakfast. 90 tablet 1   folic acid (FOLVITE) 1 MG tablet Take 1 tablet (1 mg total) by mouth daily. 30 tablet 4   gabapentin (NEURONTIN) 300 MG capsule Take 2 capsules (600 mg total) by mouth 3 (three) times daily. 180 capsule 1   glucose blood (ACCU-CHEK GUIDE) test strip Use to check blood sugar 3 times daily. E11.69 100 each 6   insulin glargine (LANTUS SOLOSTAR) 100 UNIT/ML Solostar Pen Inject 50 Units into the skin daily. 15 mL 11   Insulin Pen Needle 32G X 4 MM MISC Use with Insulin pen 100 each 3   Oxycodone HCl 10 MG TABS Take 1 tablet (10 mg) by mouth every 6 hours as needed for severe pain. (Patient taking differently: Take 10 mg by mouth 2 (two) times daily as needed (pain).) 60 tablet 0   polyethylene glycol powder (GLYCOLAX/MIRALAX) 17 GM/SCOOP powder Take 17 g by mouth daily as needed for mild constipation. 238 g 0   sildenafil (VIAGRA) 100 MG tablet Take 1/2 - 1 tablet by mouth daily  as needed for erectile dysfunction. (Patient taking differently: Take 100 mg by mouth as needed for erectile dysfunction.) 15 tablet 11   tiZANidine (ZANAFLEX) 2 MG tablet Take 1 tablet (2 mg) by mouth at bedtime. 30 tablet 1   No current facility-administered medications for this visit.    VITAL SIGNS: There were no vitals taken for this visit. There were no vitals filed for this visit.  Estimated body mass index is 22.34 kg/m as calculated from the  following:   Height as of 08/28/23: 5\' 8"  (1.727 m).   Weight as of 08/28/23: 146 lb 14.4 oz (66.6 kg).   PERFORMANCE STATUS (ECOG) : 1 - Symptomatic but completely ambulatory   Physical Exam General: NAD Cardiovascular: regular rate and rhythm Pulmonary:normal breathing pattern  Extremities: no edema, no joint deformities, brace in place  Skin: no rashes Neurological: AAO x4  IMPRESSION:   Neoplasm related pain [place] reports pain is well controlled on current regimen. MS Contin has been successfully discontinued.   Oxycodone 10 mg  as needed. He is not requiring daily. Gabapentin as prescribed. Neuropathic pain improving. Taking all medications as prescribed. Discussed use of Tylenol for mild aches and pain more arthritic focused as well as remaining active. He acknowledges pain increases when he is less mobile for long periods of time.   We will continue to closely monitor.   Pain contract on file.    2.  Appetite Continues to improve.  Weight is stable at 146lbs.  Expresses appreciation of being able to eat foods that he wants enjoyed.  PLAN: MS Contin discontinued.  Patient has successfully weaned appropriately. Oxy IR 10 mg every 6 hours as needed for breakthrough pain. Not requiring daily.  Colace twice daily Gabapentin 600 mg three times daily  Appetite much improved.Weight is stable. Pain contract signed on 10/23. I will plan to see patient back in 3-4 weeks in collaboration with his other oncology appointments.   Patient expressed understanding and was in agreement with this plan. He also understands that He can call the clinic at any time with any questions, concerns, or complaints.       Any controlled substances utilized were prescribed in the context of palliative care. PDMP has been reviewed.    Visit consisted of counseling and education dealing with the complex and emotionally intense issues of symptom management and palliative care in the setting of  serious and potentially life-threatening illness.Greater than 50%  of this time was spent counseling and coordinating care related to the above assessment and plan.  Willette Alma, AGPCNP-BC  Palliative Medicine Team/[facility]  *Please note that this is a verbal dictation therefore any spelling or grammatical errors are due to the "Dragon Medical One" system interpretation.

## 2023-09-04 NOTE — Progress Notes (Unsigned)
[facility] OFFICE PROGRESS NOTE  [name], MD [address] [place] [address]  DIAGNOSIS: Stage IV (T3a, N2, M1 C) non-small cell lung cancer, adenocarcinoma presented with right pulmonary nodules in addition to right hilar and mediastinal lymphadenopathy, pleural effusion, brain metastasis as well as extensive metastatic bone disease in the lumbar spines status post biopsy with kyphoplasty at the L2 lesion and brain metastasis diagnosed in September 2023.    Biomarker Findings Microsatellite status - MS-Stable Tumor Mutational Burden - 8 Muts/Mb Genomic Findings For a complete list of the genes assayed, please refer to the Appendix. KEAP1 Q227* CDKN2A/B [id number] G89V PRKCI amplification TERC amplification - equivocal? TP53 D233fs*18 8 Disease relevant genes with no reportable alterations: ALK, BRAF, EGFR, ERBB2, KRAS, MET, RET, ROS1   PDL1 TPS  90%  PRIOR THERAPY: 1) Status post palliative radiotherapy to the L2 lesion under the care of Dr. Kathrynn Running. 2) Status post SRS to solitary brain metastasis under the care of Dr. Kathrynn Running  CURRENT THERAPY: Palliative systemic therapy with carboplatin for AUC of 5, Alimta 500 Mg/M2 and Keytruda 200 Mg IV every 3 weeks.  First dose September 05, 2022.  Status post 17 cycles. Starting from cycle #5, he started on maintenance keytruda and alimta IV every 3 weeks.   INTERVAL HISTORY: Samuel [name] 57 y.o. male returns to the clinic today for a follow-up visit.  The patient was last seen by myself last week. He was coming in for treatment that day but was feeling unwell.  He was endorsing new onset diarrhea, chills, nausea, aches.  He was subsequently seen in the emergency room.  His workup included flu, COVID, and RSV swab which was negative.  His diarrhea had improved by the time he got to the ER so they were unable to perform stool sample for GI pathogen panel. He had a CT of the abdomen pelvis which did  not show any acute findings in the abdomen or pelvis.  He was admitted briefly for suspected enteritis.  He was given IV fluids.  He also had some hyperglycemia.  Since being discharged, he is feeling a lot better at this time. He states he feels "pretty good" today. He denies fevers but mentions that he had felt cold after eating. He also mentions more indigestion and pressure after eating. He has used tums which helps. He has a history of reflux. Denies nausea or vomiting. His bowel have returned to normal. His weight is stable. Denies any chest pain or shortness of breath. He denies cough. He denies hemoptysis. He feels a little more fatigued the last two weeks. He is wondering what his thyroid labs look like from his lab visit. Denies any headache or visual changes. He sometimes has some scattered dry skin lesions. He puts lotion on with improvement. He is here for evaluation and repeat blood work before undergoing cycle #18.    MEDICAL HISTORY: Past Medical History:  Diagnosis Date   Alcohol use disorder, severe, dependence (HCC) 12/26/2016   Diabetes mellitus without complication (HCC)    Metastatic cancer to brain (HCC) 08/05/2022   Metastatic cancer to spine (HCC) 07/19/2022   Pancreatitis unk   Primary lung adenocarcinoma (HCC) 08/05/2022   Suicidal ideation    Type 2 diabetes mellitus with hyperglycemia, without long-term current use of insulin (HCC) 06/13/2022   Formatting of this note might be different from the original. 06/13/2022 A1C 13.8, FSBG 414 Start empagliflozin 5mg /metformin 1000 daily, levemir 20  qhs. (Samples given) Will apply for medassist for pharmacy    ALLERGIES:  is allergic to penicillins and percocet [oxycodone-acetaminophen].  MEDICATIONS:  Current Outpatient Medications  Medication Sig Dispense Refill   Accu-Chek Softclix Lancets lancets Use to check blood sugar 3 times daily. E11.69 100 each 6   acetaminophen (TYLENOL) 500 MG tablet Take 1,000 mg by mouth in  the morning and at bedtime.     atorvastatin (LIPITOR) 40 MG tablet Take 1 tablet (40 mg total) by mouth daily. 90 tablet 1   buPROPion (WELLBUTRIN XL) 150 MG 24 hr tablet Take 1 tablet (150 mg total) by mouth daily. 90 tablet 1   empagliflozin (JARDIANCE) 25 MG TABS tablet Take 1 tablet (25 mg total) by mouth daily before breakfast. 90 tablet 1   folic acid (FOLVITE) 1 MG tablet Take 1 tablet (1 mg total) by mouth daily. 30 tablet 4   gabapentin (NEURONTIN) 300 MG capsule Take 2 capsules (600 mg total) by mouth 3 (three) times daily. 180 capsule 1   glucose blood (ACCU-CHEK GUIDE) test strip Use to check blood sugar 3 times daily. E11.69 100 each 6   insulin glargine (LANTUS SOLOSTAR) 100 UNIT/ML Solostar Pen Inject 50 Units into the skin daily. 15 mL 11   Insulin Pen Needle 32G X 4 MM MISC Use with Insulin pen 100 each 3   Oxycodone HCl 10 MG TABS Take 1 tablet (10 mg) by mouth every 6 hours as needed for severe pain. (Patient taking differently: Take 10 mg by mouth 2 (two) times daily as needed (pain).) 60 tablet 0   polyethylene glycol powder (GLYCOLAX/MIRALAX) 17 GM/SCOOP powder Take 17 g by mouth daily as needed for mild constipation. 238 g 0   sildenafil (VIAGRA) 100 MG tablet Take 1/2 - 1 tablet by mouth daily as needed for erectile dysfunction. (Patient taking differently: Take 100 mg by mouth as needed for erectile dysfunction.) 15 tablet 11   tiZANidine (ZANAFLEX) 2 MG tablet Take 1 tablet (2 mg) by mouth at bedtime. 30 tablet 1   No current facility-administered medications for this visit.    SURGICAL HISTORY:  Past Surgical History:  Procedure Laterality Date   IR BONE TUMOR(S)RF ABLATION  08/01/2022   IR IMAGING GUIDED PORT INSERTION  09/21/2022   IR KYPHO LUMBAR INC FX REDUCE BONE BX UNI/BIL CANNULATION INC/IMAGING  08/01/2022    REVIEW OF SYSTEMS:   Review of Systems  Constitutional: Positive for fatigue and chills. Negative for appetite change,  fever and unexpected weight  change.  HENT: Negative for mouth sores, nosebleeds, sore throat and trouble swallowing.   Eyes: Negative for eye problems and icterus.  Respiratory: Negative for cough, hemoptysis, shortness of breath and wheezing.   Cardiovascular: Negative for chest pain and leg swelling.  Gastrointestinal: Positive for indigestion. Negative for abdominal pain, constipation, diarrhea (resolved), nausea and vomiting (resolved).  Genitourinary: Negative for bladder incontinence, difficulty urinating, dysuria, frequency and hematuria.   Musculoskeletal: Positive for chronic back pain. Negative for gait problem, neck pain and neck stiffness.  Skin: Positive for mild occasional scattered skin lesions/mild itching.  Neurological: Negative for dizziness, extremity weakness, gait problem, headaches, light-headedness and seizures.  Hematological: Negative for adenopathy. Does not bruise/bleed easily.  Psychiatric/Behavioral: Negative for confusion, depression and sleep disturbance. The patient is not nervous/anxious.     PHYSICAL EXAMINATION:  Blood pressure 124/72, pulse 76, temperature 97.8 F (36.6 C), temperature source Oral, resp. rate 15, weight 152 lb (68.9 kg), SpO2 98%.  ECOG PERFORMANCE  STATUS: 1  Physical Exam  Constitutional: Oriented to person, place, and  thin appearing male, and in no distress. Appears uncomfortable and in no distress. No distress.  HENT:  Head: Normocephalic and atraumatic.  Mouth/Throat: Oropharynx is clear and moist. No oropharyngeal exudate.  Eyes: Conjunctivae are normal. Right eye exhibits no discharge. Left eye exhibits no discharge. No scleral icterus.  Neck: Normal range of motion. Neck supple.  Cardiovascular: Normal rate, regular rhythm, normal heart sounds and intact distal pulses.   Pulmonary/Chest: Effort normal and breath sounds normal. No respiratory distress. No wheezes. No rales.  Abdominal: Soft. Bowel sounds are normal. Exhibits no distension and no mass.  There is no tenderness.  Musculoskeletal: Normal range of motion. Exhibits no edema.  Lymphadenopathy:    No cervical adenopathy.  Neurological: Alert and oriented to person, place, and time. Exhibits normal muscle tone. Gait normal. Coordination normal.  Skin: Skin is warm and dry. No rash noted. Not diaphoretic. No erythema. No pallor.  Psychiatric: Mood, memory and judgment normal.  Vitals reviewed.  LABORATORY DATA: Lab Results  Component Value Date   WBC 5.5 09/06/2023   HGB 13.4 09/06/2023   HCT 40.1 09/06/2023   MCV 100.8 (H) 09/06/2023   PLT 162 09/06/2023      Chemistry      Component Value Date/Time   NA 138 08/29/2023 0354   NA 140 12/24/2020 1144   NA 142 08/02/2014 1649   K 3.5 08/29/2023 0354   K 4.2 08/02/2014 1649   CL 107 08/29/2023 0354   CL 109 (H) 08/02/2014 1649   CO2 25 08/29/2023 0354   CO2 21 08/02/2014 1649   BUN 15 08/29/2023 0354   BUN 7 12/24/2020 1144   BUN 11 08/02/2014 1649   CREATININE 0.47 (L) 08/29/2023 0354   CREATININE 0.57 (L) 08/28/2023 1138   CREATININE 0.92 08/02/2014 1649      Component Value Date/Time   CALCIUM 8.5 (L) 08/29/2023 0354   CALCIUM 8.6 08/02/2014 1649   ALKPHOS 64 08/28/2023 1138   ALKPHOS 82 08/02/2014 1649   AST 23 08/28/2023 1138   ALT 26 08/28/2023 1138   ALT 132 (H) 08/02/2014 1649   BILITOT 0.3 08/28/2023 1138       RADIOGRAPHIC STUDIES:  CT ABDOMEN PELVIS W CONTRAST  Result Date: 08/28/2023 CLINICAL DATA:  Abdominal pain, fatigue, nausea, vomiting, diarrhea. Metastatic lung cancer. EXAM: CT ABDOMEN AND PELVIS WITH CONTRAST TECHNIQUE: Multidetector CT imaging of the abdomen and pelvis was performed using the standard protocol following bolus administration of intravenous contrast. RADIATION DOSE REDUCTION: This exam was performed according to the departmental dose-optimization program which includes automated exposure control, adjustment of the mA and/or kV according to patient size and/or use of  iterative reconstruction technique. CONTRAST:  OMNIPAQUE IOHEXOL 300 MG/ML  SOLN COMPARISON:  None Available. FINDINGS: Lower chest: No acute abnormality. Old healed posterior left rib fractures. Hepatobiliary: Diffuse low-density throughout the liver compatible with fatty infiltration. No focal abnormality. Gallbladder unremarkable. Pancreas: No focal abnormality or ductal dilatation. Spleen: No focal abnormality.  Normal size. Adrenals/Urinary Tract: No adrenal abnormality. No focal renal abnormality. No stones or hydronephrosis. Urinary bladder is unremarkable. Stomach/Bowel: Normal appendix. Moderate stool burden throughout the colon. Normal appendix. Stomach, large and small bowel grossly unremarkable. Vascular/Lymphatic: Diffuse aortic atherosclerosis. No evidence of aneurysm or adenopathy. Reproductive: No visible focal abnormality. Other: No free fluid or free air. Musculoskeletal: Stable vertebra plana status post vertebroplasty at L2. Stable mixed lytic and sclerotic areas in the  lumbar spine, most pronounced in L3. No new or progressive bony abnormality. IMPRESSION: No acute findings in the abdomen or pelvis. Hepatic steatosis. Stable evidence of metastatic disease in the lumbar spine. No new or progressive bony abnormality. Electronically Signed   By: Charlett Nose M.D.   On: 08/28/2023 15:45     ASSESSMENT/PLAN:  This is a very pleasant 58 year old African-American male diagnosed with stage IV (T3, N2, M1 C) non-small cell lung cancer, adenocarcinoma.  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  He underwent biopsy and kyphoplasty to the L2 and to the brain metastasis.  He was diagnosed in September 2023.   His molecular studies showed no actionable mutations and his PD-L1 expression is 90%.  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    He underwent palliative radiation to L2 compression fracture as well as a solitary brain metastasis with SRS.    He is currently being treated with palliative systemic chemotherapy with carboplatin for an AUC of 5, Alimta 500 mg per metered square, Keytruda 200 mg IV every 3 weeks.  He status post 17 cycles and he tolerated well without any concerning adverse side effects.  Starting from cycle #5 he started maintenance Alimta and Keytruda.  IV every 3 weeks  Radiation oncology is monitoring his brain MRI and lumbar spine closely every few months. His next brain MRI is scheduled next week on 09/12/23.   The patient was hospitalized for enteritis last week and he is feeling better at this time. He had a CT of the AP without any acute findings. His nausea, vomiting, malaise, body aches, and diarrhea have resolved. He was treated as suspected enteritis.   Labs were reviewed.  Recommend that he proceed with cycle #18 today as scheduled.  We will see him back for follow-up visit in 3 weeks for evaluation repeat blood work before undergoing cycle #19.  He will continue to follow with palliative care and he is scheduled to have a telephone visit later today.   The patient seems to be feeling near his baseline at this time. It is likely this was possible viral in nature. However, in the rare event that   He was advised to try Prilosec to see if it helps with indigestion/pressure. He is not having any unusual symptoms at this time. However, we also discussed risk factors for heart disease and chest pressure symptoms that would warrant emergent evaluation such as chest pain, shortness of breath, numbness/tingling in left arm/jaw, lightheadedness, etc.  The small scattered areas of dry skin may be secondary to his immunotherapy. He can continue to use lotion. If localized itching, he  can use steroid cream.   His last thyroid studies were normal from 10/7. His labs from today are pending.   He showed me an area on his abdomen near the umbilicus that is firm. He does administer insulin but not in this area. The area feels like scar tissue. No subcutaneous lesions were seen on recent CT AP. Will continue to monitor. There is no pain or skin changes in this area.   His symptoms last week/enteritis may have been due to viral infection or contaminated food. Unlikely that it is related to his immunotherapy given his symptoms resolved without steroids. However,  I did caution the patient should he develop recurrent/worsening symptoms after this cycle of treatment, to please reach out for evaluation. He would be a poor candidate for steroids given his uncontrolled diabetes.   The patient was advised to call immediately if he has any concerning symptoms in the interval. The patient voices understanding of current disease status and treatment options and is in agreement with the current care plan. All questions were answered. The patient knows to call the clinic with any problems, questions or concerns. We can certainly see the patient much sooner if necessary    No orders of the defined types were placed in this encounter.    The total time spent in the appointment was 20-29 minutes  Maxima Skelton L Andray Assefa, PA-C 09/06/23

## 2023-09-05 ENCOUNTER — Inpatient Hospital Stay: Payer: Medicaid Other | Admitting: Physician Assistant

## 2023-09-05 ENCOUNTER — Inpatient Hospital Stay: Payer: Medicaid Other

## 2023-09-06 ENCOUNTER — Inpatient Hospital Stay: Payer: Medicaid Other

## 2023-09-06 ENCOUNTER — Inpatient Hospital Stay: Payer: Medicaid Other | Admitting: Nurse Practitioner

## 2023-09-06 ENCOUNTER — Inpatient Hospital Stay: Payer: Medicaid Other | Admitting: Physician Assistant

## 2023-09-06 VITALS — BP 124/72 | HR 76 | Temp 97.8°F | Resp 15 | Wt 152.0 lb

## 2023-09-06 DIAGNOSIS — Z5112 Encounter for antineoplastic immunotherapy: Secondary | ICD-10-CM | POA: Diagnosis not present

## 2023-09-06 DIAGNOSIS — N200 Calculus of kidney: Secondary | ICD-10-CM | POA: Diagnosis not present

## 2023-09-06 DIAGNOSIS — C3490 Malignant neoplasm of unspecified part of unspecified bronchus or lung: Secondary | ICD-10-CM | POA: Diagnosis not present

## 2023-09-06 DIAGNOSIS — E119 Type 2 diabetes mellitus without complications: Secondary | ICD-10-CM | POA: Diagnosis not present

## 2023-09-06 DIAGNOSIS — I700 Atherosclerosis of aorta: Secondary | ICD-10-CM | POA: Diagnosis not present

## 2023-09-06 DIAGNOSIS — J439 Emphysema, unspecified: Secondary | ICD-10-CM | POA: Diagnosis not present

## 2023-09-06 DIAGNOSIS — F1020 Alcohol dependence, uncomplicated: Secondary | ICD-10-CM | POA: Diagnosis not present

## 2023-09-06 DIAGNOSIS — E1165 Type 2 diabetes mellitus with hyperglycemia: Secondary | ICD-10-CM | POA: Diagnosis not present

## 2023-09-06 DIAGNOSIS — C7951 Secondary malignant neoplasm of bone: Secondary | ICD-10-CM | POA: Diagnosis not present

## 2023-09-06 DIAGNOSIS — K209 Esophagitis, unspecified without bleeding: Secondary | ICD-10-CM | POA: Diagnosis not present

## 2023-09-06 DIAGNOSIS — Z5111 Encounter for antineoplastic chemotherapy: Secondary | ICD-10-CM

## 2023-09-06 DIAGNOSIS — E538 Deficiency of other specified B group vitamins: Secondary | ICD-10-CM | POA: Diagnosis not present

## 2023-09-06 DIAGNOSIS — Z7984 Long term (current) use of oral hypoglycemic drugs: Secondary | ICD-10-CM | POA: Diagnosis not present

## 2023-09-06 DIAGNOSIS — C7931 Secondary malignant neoplasm of brain: Secondary | ICD-10-CM | POA: Diagnosis not present

## 2023-09-06 DIAGNOSIS — Z79899 Other long term (current) drug therapy: Secondary | ICD-10-CM | POA: Diagnosis not present

## 2023-09-06 DIAGNOSIS — C3411 Malignant neoplasm of upper lobe, right bronchus or lung: Secondary | ICD-10-CM | POA: Diagnosis not present

## 2023-09-06 DIAGNOSIS — K30 Functional dyspepsia: Secondary | ICD-10-CM | POA: Diagnosis not present

## 2023-09-06 LAB — CMP (CANCER CENTER ONLY)
ALT: 51 U/L — ABNORMAL HIGH (ref 0–44)
AST: 40 U/L (ref 15–41)
Albumin: 3.6 g/dL (ref 3.5–5.0)
Alkaline Phosphatase: 57 U/L (ref 38–126)
Anion gap: 8 (ref 5–15)
BUN: 12 mg/dL (ref 6–20)
CO2: 25 mmol/L (ref 22–32)
Calcium: 8.9 mg/dL (ref 8.9–10.3)
Chloride: 107 mmol/L (ref 98–111)
Creatinine: 0.59 mg/dL — ABNORMAL LOW (ref 0.61–1.24)
GFR, Estimated: >60 mL/min (ref >60)
Glucose, Bld: 64 mg/dL — ABNORMAL LOW (ref 70–99)
Potassium: 3.5 mmol/L (ref 3.5–5.1)
Sodium: 140 mmol/L (ref 135–145)
Total Bilirubin: 0.3 mg/dL (ref 0.3–1.2)
Total Protein: 7.2 g/dL (ref 6.5–8.1)

## 2023-09-06 LAB — CBC WITH DIFFERENTIAL (CANCER CENTER ONLY)
Abs Immature Granulocytes: 0.01 10*3/uL (ref 0.00–0.07)
Basophils Absolute: 0 10*3/uL (ref 0.0–0.1)
Basophils Relative: 1 %
Eosinophils Absolute: 0.1 10*3/uL (ref 0.0–0.5)
Eosinophils Relative: 2 %
HCT: 40.1 % (ref 39.0–52.0)
Hemoglobin: 13.4 g/dL (ref 13.0–17.0)
Immature Granulocytes: 0 %
Lymphocytes Relative: 23 %
Lymphs Abs: 1.2 10*3/uL (ref 0.7–4.0)
MCH: 33.7 pg (ref 26.0–34.0)
MCHC: 33.4 g/dL (ref 30.0–36.0)
MCV: 100.8 fL — ABNORMAL HIGH (ref 80.0–100.0)
Monocytes Absolute: 0.5 10*3/uL (ref 0.1–1.0)
Monocytes Relative: 9 %
Neutro Abs: 3.6 10*3/uL (ref 1.7–7.7)
Neutrophils Relative %: 65 %
Platelet Count: 162 10*3/uL (ref 150–400)
RBC: 3.98 MIL/uL — ABNORMAL LOW (ref 4.22–5.81)
RDW: 12.9 % (ref 11.5–15.5)
WBC Count: 5.5 10*3/uL (ref 4.0–10.5)
nRBC: 0 % (ref 0.0–0.2)

## 2023-09-06 LAB — TSH: TSH: 1.202 u[IU]/mL (ref 0.350–4.500)

## 2023-09-06 MED ORDER — PROCHLORPERAZINE MALEATE 10 MG PO TABS
10.0000 mg | ORAL_TABLET | Freq: Once | ORAL | Status: AC
  Administered 2023-09-06: 10 mg via ORAL
  Filled 2023-09-06: qty 1

## 2023-09-06 MED ORDER — SODIUM CHLORIDE 0.9% FLUSH
10.0000 mL | INTRAVENOUS | Status: DC | PRN
  Administered 2023-09-06: 10 mL

## 2023-09-06 MED ORDER — CYANOCOBALAMIN 1000 MCG/ML IJ SOLN
1000.0000 ug | Freq: Once | INTRAMUSCULAR | Status: AC
  Administered 2023-09-06: 1000 ug via INTRAMUSCULAR
  Filled 2023-09-06: qty 1

## 2023-09-06 MED ORDER — SODIUM CHLORIDE 0.9 % IV SOLN
200.0000 mg | Freq: Once | INTRAVENOUS | Status: AC
  Administered 2023-09-06: 200 mg via INTRAVENOUS
  Filled 2023-09-06: qty 200

## 2023-09-06 MED ORDER — SODIUM CHLORIDE 0.9 % IV SOLN: Freq: Once | INTRAVENOUS | Status: AC

## 2023-09-06 MED ORDER — HEPARIN SOD (PORK) LOCK FLUSH 100 UNIT/ML IV SOLN
500.0000 [IU] | Freq: Once | INTRAVENOUS | Status: AC | PRN
  Administered 2023-09-06: 500 [IU]

## 2023-09-06 MED ORDER — SODIUM CHLORIDE 0.9 % IV SOLN
500.0000 mg/m2 | Freq: Once | INTRAVENOUS | Status: AC
  Administered 2023-09-06: 900 mg via INTRAVENOUS
  Filled 2023-09-06: qty 20

## 2023-09-08 ENCOUNTER — Other Ambulatory Visit (HOSPITAL_COMMUNITY): Payer: Self-pay

## 2023-09-08 LAB — T4: T4, Total: 10.3 ug/dL (ref 4.5–12.0)

## 2023-09-12 ENCOUNTER — Other Ambulatory Visit: Payer: Self-pay

## 2023-09-12 ENCOUNTER — Ambulatory Visit (HOSPITAL_COMMUNITY)
Admission: RE | Admit: 2023-09-12 | Discharge: 2023-09-12 | Disposition: A | Discharge status: Home / Self Care | Payer: Medicaid Other | Source: Ambulatory Visit | Attending: Radiation Oncology | Admitting: Radiation Oncology

## 2023-09-12 ENCOUNTER — Other Ambulatory Visit: Payer: Self-pay | Admitting: Nurse Practitioner

## 2023-09-12 DIAGNOSIS — C7931 Secondary malignant neoplasm of brain: Secondary | ICD-10-CM | POA: Diagnosis present

## 2023-09-12 DIAGNOSIS — C3490 Malignant neoplasm of unspecified part of unspecified bronchus or lung: Secondary | ICD-10-CM

## 2023-09-12 DIAGNOSIS — C7951 Secondary malignant neoplasm of bone: Secondary | ICD-10-CM

## 2023-09-12 DIAGNOSIS — G893 Neoplasm related pain (acute) (chronic): Secondary | ICD-10-CM

## 2023-09-12 DIAGNOSIS — Z515 Encounter for palliative care: Secondary | ICD-10-CM

## 2023-09-12 DIAGNOSIS — M792 Neuralgia and neuritis, unspecified: Secondary | ICD-10-CM

## 2023-09-12 MED ORDER — GADOBUTROL 1 MMOL/ML IV SOLN
7.0000 mL | Freq: Once | INTRAVENOUS | Status: AC | PRN
  Administered 2023-09-12: 7 mL via INTRAVENOUS

## 2023-09-13 ENCOUNTER — Other Ambulatory Visit (HOSPITAL_COMMUNITY): Payer: Self-pay

## 2023-09-13 MED ORDER — OXYCODONE HCL 10 MG PO TABS
10.0000 mg | ORAL_TABLET | Freq: Four times a day (QID) | ORAL | 0 refills | Status: DC | PRN
  Filled 2023-09-13: qty 60, 15d supply, fill #0

## 2023-09-18 ENCOUNTER — Other Ambulatory Visit (HOSPITAL_COMMUNITY): Payer: Self-pay

## 2023-09-18 ENCOUNTER — Inpatient Hospital Stay: Payer: Medicaid Other

## 2023-09-18 ENCOUNTER — Encounter: Payer: Self-pay | Admitting: Internal Medicine

## 2023-09-18 ENCOUNTER — Ambulatory Visit: Payer: Medicaid Other | Admitting: Radiology

## 2023-09-19 ENCOUNTER — Other Ambulatory Visit (HOSPITAL_COMMUNITY): Payer: Self-pay

## 2023-09-19 ENCOUNTER — Ambulatory Visit: Payer: Medicaid Other

## 2023-09-19 ENCOUNTER — Ambulatory Visit: Payer: Medicaid Other | Admitting: Physician Assistant

## 2023-09-19 ENCOUNTER — Other Ambulatory Visit: Payer: Medicaid Other

## 2023-09-20 ENCOUNTER — Ambulatory Visit
Admission: RE | Admit: 2023-09-20 | Discharge: 2023-09-20 | Disposition: A | Discharge status: Home / Self Care | Payer: Medicaid Other | Source: Ambulatory Visit | Attending: Urology | Admitting: Urology

## 2023-09-20 ENCOUNTER — Encounter: Payer: Self-pay | Admitting: Urology

## 2023-09-20 DIAGNOSIS — C7951 Secondary malignant neoplasm of bone: Secondary | ICD-10-CM

## 2023-09-20 DIAGNOSIS — C7931 Secondary malignant neoplasm of brain: Secondary | ICD-10-CM

## 2023-09-20 NOTE — Progress Notes (Signed)
[facility] OFFICE PROGRESS NOTE  Samuel Frisk, MD 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    Biomarker Findings Microsatellite status - MS-Stable Tumor Mutational Burden - 8 Muts/Mb Genomic Findings For a complete list of the genes assayed, please refer to the Appendix. KEAP1 Q227* CDKN2A/B [id number] G89V PRKCI amplification TERC amplification - equivocal? TP53 [id number]*18 8 Disease relevant genes with no reportable alterations: ALK, BRAF, EGFR, ERBB2, KRAS, MET, RET, ROS1  PDL1 TPS  90%   PRIOR THERAPY: 1) Status post palliative radiotherapy to the L2 lesion under the care of Dr. Kathrynn Running. 2) Status post SRS to solitary brain metastasis under the care of Dr. Kathrynn Running  CURRENT THERAPY: Palliative systemic therapy with carboplatin for AUC of 5, Alimta 500 Mg/M2 and Keytruda 200 Mg IV every 3 weeks. First dose September 05, 2022. Status post 18 cycles. Starting from cycle #5, he started on maintenance keytruda and alimta IV every 3 weeks.   INTERVAL HISTORY: Samuel [name] 57 y.o. male returns to the clinic today for a follow-up visit. He states he feels "good" today except for being tired due to having an early appointment today. At his last appointment, he was recovering from gastroenteritis. The indigestion/bloating has improved. He still does get chilled after eating. He denies fevers. Denies nausea or vomiting. He denies diarrhea or constipation. His weight is stable. Not every time. Denies any chest pain or shortness of breath. He denies cough. He denies hemoptysis. Denies any  headache or visual changes. He sometimes has some scattered dry skin lesions that he puts lotion on with improvement. He is here for evaluation and repeat blood work before undergoing cycle #19.   MEDICAL HISTORY: Past Medical History:  Diagnosis Date   Alcohol use disorder, severe, dependence (HCC) 12/26/2016   Diabetes mellitus without complication (HCC)    Metastatic cancer to brain (HCC) 08/05/2022   Metastatic cancer to spine (HCC) 07/19/2022   Pancreatitis unk   Primary lung adenocarcinoma (HCC) 08/05/2022   Suicidal ideation    Type 2 diabetes mellitus with hyperglycemia, without long-term current use of insulin (HCC) 06/13/2022   Formatting of this note might be different from the original. 06/13/2022 A1C 13.8, FSBG 414 Start empagliflozin 5mg /metformin 1000 daily, levemir 20 qhs. (Samples given) Will apply for medassist for pharmacy    ALLERGIES:  is allergic to penicillins and percocet [oxycodone-acetaminophen].  MEDICATIONS:  Current Outpatient Medications  Medication Sig Dispense Refill   Accu-Chek Softclix Lancets lancets Use to check blood sugar 3 times daily. E11.69 100 each 6   acetaminophen (TYLENOL) 500 MG tablet Take 1,000 mg by mouth in the morning and at bedtime.     atorvastatin (LIPITOR) 40 MG tablet Take 1 tablet (40 mg total) by mouth daily. 90 tablet 1   buPROPion (WELLBUTRIN XL) 150 MG 24 hr tablet Take 1 tablet (150 mg total) by mouth daily. 90 tablet 1   empagliflozin (JARDIANCE) 25 MG TABS tablet Take 1 tablet (25 mg total) by mouth daily before breakfast. 90 tablet 1   folic acid (FOLVITE) 1 MG tablet Take 1 tablet (1 mg total) by mouth daily. 30 tablet 4  gabapentin (NEURONTIN) 300 MG capsule Take 2 capsules (600 mg total) by mouth 3 (three) times daily. 180 capsule 1   glucose blood (ACCU-CHEK GUIDE) test strip Use to check blood sugar 3 times daily. E11.69 100 each 6   insulin glargine (LANTUS SOLOSTAR) 100 UNIT/ML Solostar Pen Inject 50 Units into the  skin daily. 15 mL 11   Insulin Pen Needle 32G X 4 MM MISC Use with Insulin pen 100 each 3   Oxycodone HCl 10 MG TABS Take 1 tablet (10 mg) by mouth every 6 hours as needed for severe pain. 60 tablet 0   sildenafil (VIAGRA) 100 MG tablet Take 1/2 - 1 tablet by mouth daily as needed for erectile dysfunction. (Patient taking differently: Take 100 mg by mouth as needed for erectile dysfunction.) 15 tablet 11   tiZANidine (ZANAFLEX) 2 MG tablet Take 1 tablet (2 mg) by mouth at bedtime. 30 tablet 1   No current facility-administered medications for this visit.    SURGICAL HISTORY:  Past Surgical History:  Procedure Laterality Date   IR BONE TUMOR(S)RF ABLATION  08/01/2022   IR IMAGING GUIDED PORT INSERTION  09/21/2022   IR KYPHO LUMBAR INC FX REDUCE BONE BX UNI/BIL CANNULATION INC/IMAGING  08/01/2022    REVIEW OF SYSTEMS:   Review of Systems  Constitutional: Positive for chills after eating. Negative for appetite change, fatigue, fever and unexpected weight change.  HENT: Negative for mouth sores, nosebleeds, sore throat and trouble swallowing.   Eyes: Negative for eye problems and icterus.  Respiratory: Negative for cough, hemoptysis, shortness of breath and wheezing.   Cardiovascular: Negative for chest pain and leg swelling.  Gastrointestinal: Negative for abdominal pain, constipation, diarrhea, nausea and vomiting.  Genitourinary: Negative for bladder incontinence, difficulty urinating, dysuria, frequency and hematuria.   Musculoskeletal:  Positive for chronic back pain. Negative for gait problem, neck pain and neck stiffness.  Skin: Negative for itching and rash.  Neurological: Negative for dizziness, extremity weakness, gait problem, headaches, light-headedness and seizures.  Hematological: Negative for adenopathy. Does not bruise/bleed easily.  Psychiatric/Behavioral: Negative for confusion, depression and sleep disturbance. The patient is not nervous/anxious.     PHYSICAL EXAMINATION:   Blood pressure 129/85, pulse 78, temperature 98.3 F (36.8 C), temperature source Oral, resp. rate 14, weight 153 lb 6.4 oz (69.6 kg), SpO2 100%.  ECOG PERFORMANCE STATUS: 1  Physical Exam  Constitutional: Oriented to person, place, and time and well-developed, well-nourished, and in no distress.  HENT:  Head: Normocephalic and atraumatic.  Mouth/Throat: Oropharynx is clear and moist. No oropharyngeal exudate.  Eyes: Conjunctivae are normal. Right eye exhibits no discharge. Left eye exhibits no discharge. No scleral icterus.  Neck: Normal range of motion. Neck supple.  Cardiovascular: Normal rate, regular rhythm, normal heart sounds and intact distal pulses.   Pulmonary/Chest: Effort normal and breath sounds normal. No respiratory distress. No wheezes. No rales.  Abdominal: Soft. Bowel sounds are normal. Exhibits no distension and no mass. There is no tenderness.  Musculoskeletal: Normal range of motion. Exhibits no edema.  Lymphadenopathy:    No cervical adenopathy.  Neurological: Alert and oriented to person, place, and time. Exhibits normal muscle tone. Gait normal. Coordination normal.  Skin: Skin is warm and dry. No rash noted. Not diaphoretic. No erythema. No pallor.  Psychiatric: Mood, memory and judgment normal.  Vitals reviewed.  LABORATORY DATA: Lab Results  Component Value Date   WBC 5.0 09/26/2023   HGB 12.7 (L) 09/26/2023   HCT 37.5 (L) 09/26/2023  MCV 98.9 09/26/2023   PLT 246 09/26/2023      Chemistry      Component Value Date/Time   NA 140 09/06/2023 0748   NA 140 12/24/2020 1144   NA 142 08/02/2014 1649   K 3.5 09/06/2023 0748   K 4.2 08/02/2014 1649   CL 107 09/06/2023 0748   CL 109 (H) 08/02/2014 1649   CO2 25 09/06/2023 0748   CO2 21 08/02/2014 1649   BUN 12 09/06/2023 0748   BUN 7 12/24/2020 1144   BUN 11 08/02/2014 1649   CREATININE 0.59 (L) 09/06/2023 0748   CREATININE 0.92 08/02/2014 1649      Component Value Date/Time   CALCIUM 8.9  09/06/2023 0748   CALCIUM 8.6 08/02/2014 1649   ALKPHOS 57 09/06/2023 0748   ALKPHOS 82 08/02/2014 1649   AST 40 09/06/2023 0748   ALT 51 (H) 09/06/2023 0748   ALT 132 (H) 08/02/2014 1649   BILITOT 0.3 09/06/2023 0748       RADIOGRAPHIC STUDIES:  MR Brain W Wo Contrast  Result Date: 09/14/2023 CLINICAL DATA:  Brain metastases, assess treatment response 3T SRS Protocol. EXAM: MRI HEAD WITHOUT AND WITH CONTRAST TECHNIQUE: Multiplanar, multiecho pulse sequences of the brain and surrounding structures were obtained without and with intravenous contrast. CONTRAST:  7mL GADAVIST GADOBUTROL 1 MMOL/ML IV SOLN COMPARISON:  Brain MRI 06/09/2023. FINDINGS: BRAIN New Lesions: None. Larger lesions: None. Stable or Smaller lesions: Decreased conspicuity of enhancement of the treated right inferior cerebellar lesion with associated hemosiderin staining (axial image 121 series 1100, axial image 22 series 5). Other Brain findings: No evidence of acute infarct, hemorrhage, hydrocephalus, or extra-axial collection. Vascular: Normal flow voids and vessel enhancement. Skull and upper cervical spine: Normal marrow signal and enhancement. Sinuses/Orbits: Mild mucosal disease in the right sphenoid sinus. Orbits are unremarkable. Other: None. IMPRESSION: Decreased conspicuity of enhancement of the treated right inferior cerebellar lesion with associated hemosiderin staining. No new or enlarging lesions. Electronically Signed   By: Orvan Falconer M.D.   On: 09/14/2023 11:14   CT ABDOMEN PELVIS W CONTRAST  Result Date: 08/28/2023 CLINICAL DATA:  Abdominal pain, fatigue, nausea, vomiting, diarrhea. Metastatic lung cancer. EXAM: CT ABDOMEN AND PELVIS WITH CONTRAST TECHNIQUE: Multidetector CT imaging of the abdomen and pelvis was performed using the standard protocol following bolus administration of intravenous contrast. RADIATION DOSE REDUCTION: This exam was performed according to the departmental dose-optimization  program which includes automated exposure control, adjustment of the mA and/or kV according to patient size and/or use of iterative reconstruction technique. CONTRAST:  OMNIPAQUE IOHEXOL 300 MG/ML  SOLN COMPARISON:  None Available. FINDINGS: Lower chest: No acute abnormality. Old healed posterior left rib fractures. Hepatobiliary: Diffuse low-density throughout the liver compatible with fatty infiltration. No focal abnormality. Gallbladder unremarkable. Pancreas: No focal abnormality or ductal dilatation. Spleen: No focal abnormality.  Normal size. Adrenals/Urinary Tract: No adrenal abnormality. No focal renal abnormality. No stones or hydronephrosis. Urinary bladder is unremarkable. Stomach/Bowel: Normal appendix. Moderate stool burden throughout the colon. Normal appendix. Stomach, large and small bowel grossly unremarkable. Vascular/Lymphatic: Diffuse aortic atherosclerosis. No evidence of aneurysm or adenopathy. Reproductive: No visible focal abnormality. Other: No free fluid or free air. Musculoskeletal: Stable vertebra plana status post vertebroplasty at L2. Stable mixed lytic and sclerotic areas in the lumbar spine, most pronounced in L3. No new or progressive bony abnormality. IMPRESSION: No acute findings in the abdomen or pelvis. Hepatic steatosis. Stable evidence of metastatic disease in the lumbar spine. No new or  progressive bony abnormality. Electronically Signed   By: Charlett Nose M.D.   On: 08/28/2023 15:45     ASSESSMENT/PLAN:  This is a very pleasant 57 year old African-American male diagnosed with stage IV (T3, N2, M1 C) non-small cell lung cancer, adenocarcinoma.  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  He underwent biopsy and kyphoplasty to the L2 and to the brain metastasis.  He was diagnosed in September 2023.    His molecular studies showed no actionable mutations and his PD-L1 expression is 90%.  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    He underwent palliative radiation to L2 compression fracture as well as a solitary brain metastasis with SRS.    He is currently being treated with palliative systemic chemotherapy with carboplatin for an AUC of 5, Alimta 500 mg per metered square, Keytruda 200 mg IV every 3 weeks.  He status post 18 cycles and he tolerated well without any concerning adverse side effects.  Starting from cycle #5 he started maintenance Alimta and Keytruda.  IV every 3 weeks   Radiation oncology is monitoring his brain MRI and lumbar spine closely every few months. He recently had a brain MRI and follow up with radiation oncology   Labs were reviewed.  Recommend that he proceed with cycle #19 today as scheduled.  I will arrange for restaging CT of the chest prior to his next appointment. Since he had a CT of the AP last month in the ER, I will not order that at this time.   We will see him back for follow-up visit in 3 weeks for evaluation repeat blood work before undergoing cycle #20.   The small scattered areas of dry skin may be secondary to his immunotherapy. He can continue to use lotion. If localized itching, he can use steroid cream.   The patient was advised to call immediately if he has any concerning symptoms in the interval. The patient voices understanding of current disease status and treatment options and is in agreement with the current care plan. All questions were answered. The patient knows to call the clinic with any problems, questions or concerns. We can certainly see the patient much sooner if necessary    Orders Placed This Encounter  Procedures   CT Chest W Contrast    Standing Status:   Future    Standing Expiration Date:   09/25/2024     Order Specific Question:   If indicated for the ordered procedure, I authorize the administration of contrast media per Radiology protocol    Answer:   Yes    Order Specific Question:   Does the patient have a contrast media/X-ray dye allergy?    Answer:   No    Order Specific Question:   Preferred imaging location?    Answer:   [facility]     The total time spent in the appointment was 20-29 minutes  Samuel Cardiff L Alanson Hausmann, PA-C 09/26/23

## 2023-09-20 NOTE — Progress Notes (Signed)
Telephone nursing appointment for review of most recent MRI results. I verified patient's identity x2 and began nursing interview.   Patient reports ing well. Patient denies all related issues at this time.   Meaningful use complete.   Patient aware of their 11:30am telephone appointment w/ Ashlyn Bruning PA-C. I left my extension [phone] in case patient needs anything. Patient verbalized understanding. This concludes the nursing interview.   Patient contact [phone]     [name], LPN

## 2023-09-20 NOTE — Progress Notes (Signed)
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  Interval Since Last Radiation:  1 year  08/17/2022 through 08/30/2022 Site Technique Total Dose (Gy) Dose per Fx (Gy) Completed Fx Beam Energies  Lumbar Spine: Spine 3D 30/30 3 10/10 10X, 15X  Brain: Brain_SRS IMRT 20/20 20 1/1 6XFFF   Narrative:  I spoke with the patient to conduct his routine scheduled 3 month follow up visit to review results of his recent MRI scans via telephone to spare the patient unnecessary potential exposure in the healthcare setting during the current COVID-19 pandemic.  The patient was notified in advance and gave permission to proceed with this visit format.  He tolerated radiation therapy very well and has appreciated significant improvement in his back pain. He has continued to tolerate the systemic therapy well and completed 4 cycles of the carboplatin/Alimta/Keytruda and before transitioning to maintenance therapy with Alimta/Keytruda only, starting from cycle 5. He feels like his energy and strength continue improving since coming off the carboplatinin and he has been able to gradually increase his activity level which he is pleased with.  His restaging CT C/A/P scans from 07/31/23 continue to show an excellent response to treatment with no evidence for recurrence or extraosseous metastatic disease in the thorax, abdomen or pelvis and a stable appearance of the osseous metastatic disease in the thoracolumbar spine. The plan is to continue his current systemic therapy and follow up with Dr. Arbutus Ped 11/06/23.  He had a recent MRI brain scan on 09/12/23 that showed resolution of the treated left  temporal and right occipital lesions and decreased conspicuity and enhancement of the treated punctate lesion in the right cerebellum. No new lesions were identified. MRI of the lumbar and thoracic spine were performed 06/09/23 and showed stability of the treated metastatic disease in the lumbar spine (L2) and an overall unchanged appearance of the metastatic disease in the thoracic and lumbar spine compared to the MRI from 03/01/2023. No new lesions or epidural soft tissue tumor was identified. His follow up imaging with CT C/A/P since that time have remained without evidence of osseous metastatic disease progression. We reviewed these results today.  On review of systems, the patient states that he is doing well in general. He feels like he is getting stronger each day and continues with more energy since completing the chemotherapy portion of his treatment.  He does continue with some mild back pain in the mid to lower back but reports that this is well-controlled with pain medication.  He continues using a back brace for support and has joined [employer] to help with stretching, strength and balance.  He denies any paresthesias or focal weakness in the upper or lower extremities and has not noticed any change in his bowel or bladder habits.  He does have occasional muscle cramps/tightness in the low back, at the site of kyphoplasty, particularly in the mornings but these usually resolve spontaneously throughout the day with repositioning, stretching and massage. He denies headaches, nausea, vomiting, dizziness, slurred speech, changes in visual or auditory acuity or tremors.  He has been able to gradually increase his activities at [employer]  and overall, he is quite pleased with his progress to date.  ALLERGIES:  is allergic to penicillins and percocet [oxycodone-acetaminophen].  Meds: Current Outpatient Medications  Medication Sig Dispense Refill   Accu-Chek Softclix Lancets lancets Use to  check blood sugar 3 times daily. E11.69 100 each 6   acetaminophen (TYLENOL) 500 MG tablet Take 1,000 mg by mouth in the morning and at bedtime.     atorvastatin (LIPITOR) 40 MG tablet Take 1 tablet (40 mg total) by mouth daily. 90 tablet 1   buPROPion (WELLBUTRIN XL) 150 MG 24 hr tablet Take 1 tablet (150 mg total) by mouth daily. 90 tablet 1   empagliflozin (JARDIANCE) 25 MG TABS tablet Take 1 tablet (25 mg total) by mouth daily before breakfast. 90 tablet 1   folic acid (FOLVITE) 1 MG tablet Take 1 tablet (1 mg total) by mouth daily. 30 tablet 4   gabapentin (NEURONTIN) 300 MG capsule Take 2 capsules (600 mg total) by mouth 3 (three) times daily. 180 capsule 1   glucose blood (ACCU-CHEK GUIDE) test strip Use to check blood sugar 3 times daily. E11.69 100 each 6   insulin glargine (LANTUS SOLOSTAR) 100 UNIT/ML Solostar Pen Inject 50 Units into the skin daily. 15 mL 11   Insulin Pen Needle 32G X 4 MM MISC Use with Insulin pen 100 each 3   Oxycodone HCl 10 MG TABS Take 1 tablet (10 mg) by mouth every 6 hours as needed for severe pain. 60 tablet 0   polyethylene glycol powder (GLYCOLAX/MIRALAX) 17 GM/SCOOP powder Take 17 g by mouth daily as needed for mild constipation. 238 g 0   sildenafil (VIAGRA) 100 MG tablet Take 1/2 - 1 tablet by mouth daily as needed for erectile dysfunction. (Patient taking differently: Take 100 mg by mouth as needed for erectile dysfunction.) 15 tablet 11   tiZANidine (ZANAFLEX) 2 MG tablet Take 1 tablet (2 mg) by mouth at bedtime. 30 tablet 1   No current facility-administered medications for this encounter.    Physical Findings:  vitals were not taken for this visit.  Pain Assessment Pain Score: 0-No pain/10 Unable to assess due to telephone follow-up visit format.  Lab Findings: Lab Results  Component Value Date   WBC 5.5 09/06/2023   HGB 13.4 09/06/2023   HCT 40.1 09/06/2023   MCV 100.8 (H) 09/06/2023   PLT 162 09/06/2023     Radiographic Findings: MR  Brain W Wo Contrast  Result Date: 09/14/2023 CLINICAL DATA:  Brain metastases, assess treatment response 3T SRS Protocol. EXAM: MRI HEAD WITHOUT AND WITH CONTRAST TECHNIQUE: Multiplanar, multiecho pulse sequences of the brain and surrounding structures were obtained without and with intravenous contrast. CONTRAST:  7mL GADAVIST GADOBUTROL 1 MMOL/ML IV SOLN COMPARISON:  Brain MRI 06/09/2023. FINDINGS: BRAIN New Lesions: None. Larger lesions: None. Stable or Smaller lesions: Decreased conspicuity of enhancement of the treated right inferior cerebellar lesion with associated hemosiderin staining (axial image 121 series 1100, axial image 22 series 5). Other Brain findings: No evidence of acute infarct, hemorrhage, hydrocephalus, or extra-axial collection. Vascular: Normal flow voids and vessel enhancement. Skull and upper cervical spine: Normal marrow signal and enhancement. Sinuses/Orbits: Mild mucosal disease in the right sphenoid sinus. Orbits are unremarkable. Other: None. IMPRESSION: Decreased conspicuity of enhancement of the treated right inferior cerebellar lesion with associated hemosiderin staining. No new or enlarging lesions. Electronically Signed   By: Orvan Falconer M.D.   On: 09/14/2023 11:14   CT ABDOMEN PELVIS W CONTRAST  Result Date:  08/28/2023 CLINICAL DATA:  Abdominal pain, fatigue, nausea, vomiting, diarrhea. Metastatic lung cancer. EXAM: CT ABDOMEN AND PELVIS WITH CONTRAST TECHNIQUE: Multidetector CT imaging of the abdomen and pelvis was performed using the standard protocol following bolus administration of intravenous contrast. RADIATION DOSE REDUCTION: This exam was performed according to the departmental dose-optimization program which includes automated exposure control, adjustment of the mA and/or kV according to patient size and/or use of iterative reconstruction technique. CONTRAST:  OMNIPAQUE IOHEXOL 300 MG/ML  SOLN COMPARISON:  None Available. FINDINGS: Lower chest: No acute  abnormality. Old healed posterior left rib fractures. Hepatobiliary: Diffuse low-density throughout the liver compatible with fatty infiltration. No focal abnormality. Gallbladder unremarkable. Pancreas: No focal abnormality or ductal dilatation. Spleen: No focal abnormality.  Normal size. Adrenals/Urinary Tract: No adrenal abnormality. No focal renal abnormality. No stones or hydronephrosis. Urinary bladder is unremarkable. Stomach/Bowel: Normal appendix. Moderate stool burden throughout the colon. Normal appendix. Stomach, large and small bowel grossly unremarkable. Vascular/Lymphatic: Diffuse aortic atherosclerosis. No evidence of aneurysm or adenopathy. Reproductive: No visible focal abnormality. Other: No free fluid or free air. Musculoskeletal: Stable vertebra plana status post vertebroplasty at L2. Stable mixed lytic and sclerotic areas in the lumbar spine, most pronounced in L3. No new or progressive bony abnormality. IMPRESSION: No acute findings in the abdomen or pelvis. Hepatic steatosis. Stable evidence of metastatic disease in the lumbar spine. No new or progressive bony abnormality. Electronically Signed   By: Charlett Nose M.D.   On: 08/28/2023 15:45    Impression/Plan: 1. 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  He appears to have recovered well from the effects of the radiation and is currently without complaints.  His brain disease continues to appear well controlled on recent MRI brain scan. He continues with mild back pain that is well-controlled with pain medications as needed.  We reviewed the results of his recent CT C/A/P scans which show a stable appearance of the metastatic lesions in the thoracolumbar spine, particularly at T3 and T9.  Fortunately, these remain small and are not causing him any significant discomfort so he prefers to continue to monitor these on repeat scans.  He  understands the potential role for palliative radiotherapy to these areas in the future should they become more bothersome or no longer responding to the pain medication.  At this point, he feels like his pain is well-controlled so we will continue to monitor the brain with repeat MRI scans every 3 months and a telephone follow-up visit thereafter to review results and recommendations from the multidisciplinary brain and spine conference.  We will only repeat MRI of the spine if he develops new or worsening pain and/or there is any evidence of disease progression on future CT imaging of the C/A/P.  He knows that he is to call and/or seek evaluation immediately should he develop any uncontrolled pain or neurologic dysfunction as discussed today.  He will continue in routine follow up with Dr. Arbutus Ped as well, for continued management of his systemic disease. He has a good understanding of our recommendations and is comfortable and in agreement with the stated plan.   I personally spent 30 minutes in this encounter including chart review, reviewing radiological studies, telephone conversation with the patient, entering orders and completing documentation.    Marguarite Arbour, PA-C

## 2023-09-25 ENCOUNTER — Other Ambulatory Visit: Payer: Self-pay | Admitting: Radiation Therapy

## 2023-09-25 DIAGNOSIS — C7931 Secondary malignant neoplasm of brain: Secondary | ICD-10-CM

## 2023-09-25 NOTE — Progress Notes (Unsigned)
Palliative Medicine [facility]  Telephone:([phone] Fax:([phone]) [phone]   Name: Samuel [name] Date: 09/25/2023 MRN: [id number]  DOB: 1966/02/20  Patient Care Team: [name], MD as PCP - General (Pulmonary Disease) Rema Fendt, NP as Nurse Practitioner (Nurse Practitioner) Valerie Roys, Arty Baumgartner, NP as Nurse Practitioner (Nurse Practitioner)   INTERVAL HISTORY: Samuel [name] is a 57 y.o. 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  Palliative ask to see for symptom management and goals of care.   SOCIAL HISTORY:     reports that he has been smoking cigarettes. He has never used smokeless tobacco. He reports current alcohol use of about 50.0 standard drinks of alcohol per week. He reports that he does not use drugs.  ADVANCE DIRECTIVES:  Patient does not have an advanced directives. Education and packet provided. He has expressed interest in completing. States his sister, Tyce Delcid would be his designated Clinical research associate.   CODE STATUS: Full code  PAST MEDICAL HISTORY: Past Medical History:  Diagnosis Date  . Alcohol use disorder, severe, dependence (HCC) 12/26/2016  . Diabetes mellitus without complication (HCC)   . Metastatic cancer to brain (HCC) 08/05/2022  . 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 Pancreatitis unk  . Primary lung adenocarcinoma (HCC) 08/05/2022  . Suicidal ideation   . Type 2 diabetes mellitus with hyperglycemia, without long-term current use of insulin (HCC) 06/13/2022   Formatting of this note might be different from the original. 06/13/2022 A1C 13.8, FSBG 414 Start empagliflozin 5mg /metformin 1000 daily, levemir 20 qhs. (Samples given) Will apply for medassist for pharmacy    ALLERGIES:  is allergic to penicillins and percocet  [oxycodone-acetaminophen].  MEDICATIONS:  Current Outpatient Medications  Medication Sig Dispense Refill  . Accu-Chek Softclix Lancets lancets Use to check blood sugar 3 times daily. E11.69 100 each 6  . acetaminophen (TYLENOL) 500 MG tablet Take 1,000 mg by mouth in the morning and at bedtime.    [name] atorvastatin (LIPITOR) 40 MG tablet Take 1 tablet (40 mg total) by mouth daily. 90 tablet 1  . buPROPion (WELLBUTRIN XL) 150 MG 24 hr tablet Take 1 tablet (150 mg total) by mouth daily. 90 tablet 1  . empagliflozin (JARDIANCE) 25 MG TABS tablet Take 1 tablet (25 mg total) by mouth daily before breakfast. 90 tablet 1  . folic acid (FOLVITE) 1 MG tablet Take 1 tablet (1 mg total) by mouth daily. 30 tablet 4  . gabapentin (NEURONTIN) 300 MG capsule Take 2 capsules (600 mg total) by mouth 3 (three) times daily. 180 capsule 1  . glucose blood (ACCU-CHEK GUIDE) test strip Use to check blood sugar 3 times daily. E11.69 100 each 6  . insulin glargine (LANTUS SOLOSTAR) 100 UNIT/ML Solostar Pen Inject 50 Units into the skin daily. 15 mL 11  . Insulin Pen Needle 32G X 4 MM MISC Use with Insulin pen 100 each 3  . Oxycodone HCl 10 MG TABS Take 1 tablet (10 mg) by mouth every 6 hours as needed for severe pain. 60 tablet 0  . sildenafil (VIAGRA) 100 MG tablet Take 1/2 - 1 tablet by mouth daily as needed for erectile dysfunction. (Patient taking differently: Take 100 mg by mouth as needed for erectile dysfunction.) 15 tablet 11  . tiZANidine (ZANAFLEX) 2 MG tablet Take 1 tablet (2 mg) by mouth at bedtime. 30  tablet 1   No current facility-administered medications for this visit.    VITAL SIGNS: There were no vitals taken for this visit. There were no vitals filed for this visit.  Estimated body mass index is 23.11 kg/m as calculated from the following:   Height as of 08/28/23: 5\' 8"  (1.727 m).   Weight as of 09/06/23: 152 lb (68.9 kg).   PERFORMANCE STATUS (ECOG) : 1 - Symptomatic but completely  ambulatory   Physical Exam General: NAD Cardiovascular: regular rate and rhythm Pulmonary:normal breathing pattern  Extremities: no edema, no joint deformities, brace in place  Skin: no rashes Neurological: AAO x4  IMPRESSION:   Neoplasm related pain Ashvin reports pain is well controlled on current regimen. MS Contin has been successfully discontinued.   Oxycodone 10 mg  as needed. He is not requiring daily. Gabapentin as prescribed. Neuropathic pain improving. Taking all medications as prescribed. Discussed use of Tylenol for mild aches and pain more arthritic focused as well as remaining active. He acknowledges pain increases when he is less mobile for long periods of time.   We will continue to closely monitor.   Pain contract on file.    2.  Appetite Continues to improve.  Weight is stable at 146lbs.  Expresses appreciation of being able to eat foods that he wants enjoyed.  PLAN: MS Contin discontinued.  Patient has successfully weaned appropriately. Oxy IR 10 mg every 6 hours as needed for breakthrough pain. Not requiring daily.  Colace twice daily Gabapentin 600 mg three times daily  Appetite much improved.Weight is stable. Pain contract signed on 10/23. I will plan to see patient back in 3-4 weeks in collaboration with his other oncology appointments.   Patient expressed understanding and was in agreement with this plan. He also understands that He can call the clinic at any time with any questions, concerns, or complaints.       Any controlled substances utilized were prescribed in the context of palliative care. PDMP has been reviewed.    Visit consisted of counseling and education dealing with the complex and emotionally intense issues of symptom management and palliative care in the setting of serious and potentially life-threatening illness.Greater than 50%  of this time was spent counseling and coordinating care related to the above assessment and plan.  Willette Alma, AGPCNP-BC  Palliative Medicine Team/[facility]  *Please note that this is a verbal dictation therefore any spelling or grammatical errors are due to the "[employer]agon [employer] One" system interpretation.

## 2023-09-26 ENCOUNTER — Inpatient Hospital Stay: Payer: Medicaid Other | Attending: Adult Health | Admitting: Physician Assistant

## 2023-09-26 ENCOUNTER — Inpatient Hospital Stay (HOSPITAL_BASED_OUTPATIENT_CLINIC_OR_DEPARTMENT_OTHER): Payer: Medicaid Other | Admitting: Nurse Practitioner

## 2023-09-26 ENCOUNTER — Inpatient Hospital Stay: Payer: Medicaid Other | Attending: Adult Health

## 2023-09-26 ENCOUNTER — Encounter: Payer: Self-pay | Admitting: Nurse Practitioner

## 2023-09-26 ENCOUNTER — Inpatient Hospital Stay: Payer: Medicaid Other

## 2023-09-26 VITALS — BP 129/85 | HR 78 | Temp 98.3°F | Resp 14 | Wt 153.4 lb

## 2023-09-26 DIAGNOSIS — C3490 Malignant neoplasm of unspecified part of unspecified bronchus or lung: Secondary | ICD-10-CM

## 2023-09-26 DIAGNOSIS — F1020 Alcohol dependence, uncomplicated: Secondary | ICD-10-CM | POA: Diagnosis not present

## 2023-09-26 DIAGNOSIS — C7951 Secondary malignant neoplasm of bone: Secondary | ICD-10-CM

## 2023-09-26 DIAGNOSIS — R0981 Nasal congestion: Secondary | ICD-10-CM | POA: Insufficient documentation

## 2023-09-26 DIAGNOSIS — Z5112 Encounter for antineoplastic immunotherapy: Secondary | ICD-10-CM

## 2023-09-26 DIAGNOSIS — Z5111 Encounter for antineoplastic chemotherapy: Secondary | ICD-10-CM | POA: Diagnosis not present

## 2023-09-26 DIAGNOSIS — R112 Nausea with vomiting, unspecified: Secondary | ICD-10-CM | POA: Diagnosis not present

## 2023-09-26 DIAGNOSIS — C7931 Secondary malignant neoplasm of brain: Secondary | ICD-10-CM | POA: Diagnosis not present

## 2023-09-26 DIAGNOSIS — R197 Diarrhea, unspecified: Secondary | ICD-10-CM | POA: Diagnosis not present

## 2023-09-26 DIAGNOSIS — G893 Neoplasm related pain (acute) (chronic): Secondary | ICD-10-CM | POA: Diagnosis not present

## 2023-09-26 DIAGNOSIS — Z7984 Long term (current) use of oral hypoglycemic drugs: Secondary | ICD-10-CM | POA: Diagnosis not present

## 2023-09-26 DIAGNOSIS — Z515 Encounter for palliative care: Secondary | ICD-10-CM | POA: Diagnosis not present

## 2023-09-26 DIAGNOSIS — I700 Atherosclerosis of aorta: Secondary | ICD-10-CM | POA: Insufficient documentation

## 2023-09-26 DIAGNOSIS — Z79899 Other long term (current) drug therapy: Secondary | ICD-10-CM | POA: Diagnosis not present

## 2023-09-26 DIAGNOSIS — E119 Type 2 diabetes mellitus without complications: Secondary | ICD-10-CM | POA: Diagnosis not present

## 2023-09-26 DIAGNOSIS — F17210 Nicotine dependence, cigarettes, uncomplicated: Secondary | ICD-10-CM | POA: Insufficient documentation

## 2023-09-26 DIAGNOSIS — C3411 Malignant neoplasm of upper lobe, right bronchus or lung: Secondary | ICD-10-CM | POA: Diagnosis not present

## 2023-09-26 DIAGNOSIS — K760 Fatty (change of) liver, not elsewhere classified: Secondary | ICD-10-CM | POA: Insufficient documentation

## 2023-09-26 DIAGNOSIS — G479 Sleep disorder, unspecified: Secondary | ICD-10-CM | POA: Diagnosis not present

## 2023-09-26 DIAGNOSIS — K219 Gastro-esophageal reflux disease without esophagitis: Secondary | ICD-10-CM | POA: Diagnosis not present

## 2023-09-26 DIAGNOSIS — Z923 Personal history of irradiation: Secondary | ICD-10-CM | POA: Diagnosis not present

## 2023-09-26 LAB — CBC WITH DIFFERENTIAL (CANCER CENTER ONLY)
Abs Immature Granulocytes: 0.02 10*3/uL (ref 0.00–0.07)
Basophils Absolute: 0 10*3/uL (ref 0.0–0.1)
Basophils Relative: 1 %
Eosinophils Absolute: 0.1 10*3/uL (ref 0.0–0.5)
Eosinophils Relative: 1 %
HCT: 37.5 % — ABNORMAL LOW (ref 39.0–52.0)
Hemoglobin: 12.7 g/dL — ABNORMAL LOW (ref 13.0–17.0)
Immature Granulocytes: 0 %
Lymphocytes Relative: 19 %
Lymphs Abs: 1 10*3/uL (ref 0.7–4.0)
MCH: 33.5 pg (ref 26.0–34.0)
MCHC: 33.9 g/dL (ref 30.0–36.0)
MCV: 98.9 fL (ref 80.0–100.0)
Monocytes Absolute: 0.5 10*3/uL (ref 0.1–1.0)
Monocytes Relative: 11 %
Neutro Abs: 3.4 10*3/uL (ref 1.7–7.7)
Neutrophils Relative %: 68 %
Platelet Count: 246 10*3/uL (ref 150–400)
RBC: 3.79 MIL/uL — ABNORMAL LOW (ref 4.22–5.81)
RDW: 13 % (ref 11.5–15.5)
WBC Count: 5 10*3/uL (ref 4.0–10.5)
nRBC: 0 % (ref 0.0–0.2)

## 2023-09-26 LAB — CMP (CANCER CENTER ONLY)
ALT: 38 U/L (ref 0–44)
AST: 28 U/L (ref 15–41)
Albumin: 3.6 g/dL (ref 3.5–5.0)
Alkaline Phosphatase: 61 U/L (ref 38–126)
Anion gap: 5 (ref 5–15)
BUN: 12 mg/dL (ref 6–20)
CO2: 27 mmol/L (ref 22–32)
Calcium: 8.8 mg/dL — ABNORMAL LOW (ref 8.9–10.3)
Chloride: 109 mmol/L (ref 98–111)
Creatinine: 0.58 mg/dL — ABNORMAL LOW (ref 0.61–1.24)
GFR, Estimated: >60 mL/min (ref >60)
Glucose, Bld: 94 mg/dL (ref 70–99)
Potassium: 3.5 mmol/L (ref 3.5–5.1)
Sodium: 141 mmol/L (ref 135–145)
Total Bilirubin: 0.3 mg/dL (ref <1.2)
Total Protein: 6.8 g/dL (ref 6.5–8.1)

## 2023-09-26 MED ORDER — PROCHLORPERAZINE MALEATE 10 MG PO TABS
10.0000 mg | ORAL_TABLET | Freq: Once | ORAL | Status: AC
  Administered 2023-09-26: 10 mg via ORAL

## 2023-09-26 MED ORDER — SODIUM CHLORIDE 0.9 % IV SOLN
200.0000 mg | Freq: Once | INTRAVENOUS | Status: AC
  Administered 2023-09-26: 200 mg via INTRAVENOUS
  Filled 2023-09-26: qty 200

## 2023-09-26 MED ORDER — SODIUM CHLORIDE 0.9 % IV SOLN
500.0000 mg/m2 | Freq: Once | INTRAVENOUS | Status: AC
  Administered 2023-09-26: 900 mg via INTRAVENOUS
  Filled 2023-09-26: qty 20

## 2023-09-26 MED ORDER — SODIUM CHLORIDE 0.9% FLUSH
10.0000 mL | Freq: Once | INTRAVENOUS | Status: AC
  Administered 2023-09-26: 10 mL

## 2023-09-26 MED ORDER — SODIUM CHLORIDE 0.9 % IV SOLN: Freq: Once | INTRAVENOUS | Status: AC

## 2023-09-26 NOTE — Patient Instructions (Signed)
[facility] - A DEPT OF [facility]  Discharge Instructions: Thank you for choosing [facility] to provide your oncology and hematology care.   If you have a lab appointment with the Cancer Center, please go directly to the Cancer Center and check in at the registration area.   Wear comfortable clothing and clothing appropriate for easy access to any Portacath or PICC line.   We strive to give you quality time with your provider. You may need to reschedule your appointment if you arrive late (15 or more minutes).  Arriving late affects you and other patients whose appointments are after yours.  Also, if you miss three or more appointments without notifying the office, you may be dismissed from the clinic at the provider's discretion.      For prescription refill requests, have your pharmacy contact our office and allow 72 hours for refills to be completed.    Today you received the following chemotherapy and/or immunotherapy agents Keytruda / Alimta      To help prevent nausea and vomiting after your treatment, we encourage you to take your nausea medication as directed.  BELOW ARE SYMPTOMS THAT SHOULD BE REPORTED IMMEDIATELY: *FEVER GREATER THAN 100.4 F (38 C) OR HIGHER *CHILLS OR SWEATING *NAUSEA AND VOMITING THAT IS NOT CONTROLLED WITH YOUR NAUSEA MEDICATION *UNUSUAL SHORTNESS OF BREATH *UNUSUAL BRUISING OR BLEEDING *URINARY PROBLEMS (pain or burning when urinating, or frequent urination) *BOWEL PROBLEMS (unusual diarrhea, constipation, pain near the anus) TENDERNESS IN MOUTH AND THROAT WITH OR WITHOUT PRESENCE OF ULCERS (sore throat, sores in mouth, or a toothache) UNUSUAL RASH, SWELLING OR PAIN  UNUSUAL VAGINAL DISCHARGE OR ITCHING   Items with * indicate a potential emergency and should be followed up as soon as possible or go to the Emergency Department if any problems should occur.  Please show the CHEMOTHERAPY ALERT CARD or  IMMUNOTHERAPY ALERT CARD at check-in to the Emergency Department and triage nurse.  Should you have questions after your visit or need to cancel or reschedule your appointment, please contact [facility] - A DEPT OF [facility]  Dept: [phone]  and follow the prompts.  Office hours are 8:00 a.m. to 4:30 p.m. Monday - Friday. Please note that voicemails left after 4:00 p.m. may not be returned until the following business day.  We are closed weekends and major holidays. You have access to a nurse at all times for urgent questions. Please call the main number to the clinic Dept: [phone] and follow the prompts.   For any non-urgent questions, you may also contact your provider using MyChart. We now offer e-Visits for anyone 98 and older to request care online for non-urgent symptoms. For details visit [email or web address].   Also download the MyChart app! Go to the app store, search "MyChart", open the app, select [facility], and log in with your MyChart username and password.

## 2023-10-09 ENCOUNTER — Ambulatory Visit: Payer: Medicaid Other | Admitting: Internal Medicine

## 2023-10-09 ENCOUNTER — Other Ambulatory Visit: Payer: Medicaid Other

## 2023-10-09 ENCOUNTER — Ambulatory Visit: Payer: Medicaid Other

## 2023-10-10 ENCOUNTER — Other Ambulatory Visit (HOSPITAL_COMMUNITY): Payer: Self-pay

## 2023-10-10 ENCOUNTER — Other Ambulatory Visit: Payer: Self-pay | Admitting: Nurse Practitioner

## 2023-10-10 ENCOUNTER — Ambulatory Visit (HOSPITAL_COMMUNITY)
Admission: RE | Admit: 2023-10-10 | Discharge: 2023-10-10 | Disposition: A | Discharge status: Home / Self Care | Payer: Medicaid Other | Source: Ambulatory Visit | Attending: Physician Assistant | Admitting: Physician Assistant

## 2023-10-10 ENCOUNTER — Encounter: Payer: Self-pay | Admitting: Internal Medicine

## 2023-10-10 DIAGNOSIS — C7951 Secondary malignant neoplasm of bone: Secondary | ICD-10-CM

## 2023-10-10 DIAGNOSIS — C3490 Malignant neoplasm of unspecified part of unspecified bronchus or lung: Secondary | ICD-10-CM | POA: Insufficient documentation

## 2023-10-10 DIAGNOSIS — Z515 Encounter for palliative care: Secondary | ICD-10-CM

## 2023-10-10 DIAGNOSIS — G893 Neoplasm related pain (acute) (chronic): Secondary | ICD-10-CM

## 2023-10-10 DIAGNOSIS — M792 Neuralgia and neuritis, unspecified: Secondary | ICD-10-CM

## 2023-10-10 MED ORDER — OXYCODONE HCL 10 MG PO TABS
10.0000 mg | ORAL_TABLET | Freq: Four times a day (QID) | ORAL | 0 refills | Status: DC | PRN
  Filled 2023-10-10: qty 60, 15d supply, fill #0

## 2023-10-10 MED ORDER — HEPARIN SOD (PORK) LOCK FLUSH 100 UNIT/ML IV SOLN
500.0000 [IU] | Freq: Once | INTRAVENOUS | Status: AC
  Administered 2023-10-10: 500 [IU] via INTRAVENOUS

## 2023-10-10 MED ORDER — IOHEXOL 300 MG/ML  SOLN
75.0000 mL | Freq: Once | INTRAMUSCULAR | Status: AC | PRN
  Administered 2023-10-10: 75 mL via INTRAVENOUS

## 2023-10-10 MED ORDER — HEPARIN SOD (PORK) LOCK FLUSH 100 UNIT/ML IV SOLN
INTRAVENOUS | Status: AC
  Filled 2023-10-10: qty 5

## 2023-10-10 NOTE — Progress Notes (Signed)
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    Biomarker Findings Microsatellite status - MS-Stable Tumor Mutational Burden - 8 Muts/Mb Genomic Findings For a complete list of the genes assayed, please refer to the Appendix. KEAP1 Q227* CDKN2A/B [id number] G89V PRKCI amplification TERC amplification - equivocal? TP53 [id number]*18 8 Disease relevant genes with no reportable alterations: ALK, BRAF, EGFR, ERBB2, KRAS, MET, RET, ROS1   PDL1 TPS  90%  PRIOR THERAPY:   1) Status post palliative radiotherapy to the L2 lesion under the care of Dr. Kathrynn Running. 2) Status post SRS to solitary brain metastasis under the care of Dr. Kathrynn Running  CURRENT THERAPY:  Palliative systemic therapy with carboplatin for AUC of 5, Alimta 500 Mg/M2 and Keytruda 200 Mg IV every 3 weeks. First dose September 05, 2022. Status post 19 cycles. Starting from cycle #5, he started on maintenance keytruda and alimta IV every 3 weeks.   INTERVAL HISTORY: Samuel [name] 57 y.o. male returns to the clinic today for a follow-up visit. He states he feels "good" today except for he just recently moved and he gets nasal congestion in his new room. He has tried to use dayQuil which does help. He reports itchy and watery eyes as well. He denies fevers. Denies nausea or vomiting. He denies diarrhea or constipation. His weight is stable. Denies any chest pain or shortness of breath. He denies cough. He denies hemoptysis. Denies any headache or visual changes. He denies any new rashes.  He  recently had a restaging CT scan performed.  He is here for evaluation and to review his scan results before undergoing cycle #20.    MEDICAL HISTORY: Past Medical History:  Diagnosis Date   Alcohol use disorder, severe, dependence (HCC) 12/26/2016   Diabetes mellitus without complication (HCC)    Metastatic cancer to brain (HCC) 08/05/2022   Metastatic cancer to spine (HCC) 07/19/2022   Pancreatitis unk   Primary lung adenocarcinoma (HCC) 08/05/2022   Suicidal ideation    Type 2 diabetes mellitus with hyperglycemia, without long-term current use of insulin (HCC) 06/13/2022   Formatting of this note might be different from the original. 06/13/2022 A1C 13.8, FSBG 414 Start empagliflozin 5mg /metformin 1000 daily, levemir 20 qhs. (Samples given) Will apply for medassist for pharmacy    ALLERGIES:  is allergic to penicillins and percocet [oxycodone-acetaminophen].  MEDICATIONS:  Current Outpatient Medications  Medication Sig Dispense Refill   Accu-Chek Softclix Lancets lancets Use to check blood sugar 3 times daily. E11.69 100 each 6   acetaminophen (TYLENOL) 500 MG tablet Take 1,000 mg by mouth in the morning and at bedtime.     atorvastatin (LIPITOR) 40 MG tablet Take 1 tablet (40 mg total) by mouth daily. 90 tablet 1   buPROPion (WELLBUTRIN XL) 150 MG 24 hr tablet Take 1 tablet (150 mg total) by mouth daily. 90 tablet 1   empagliflozin (JARDIANCE) 25 MG TABS tablet Take 1 tablet (25 mg total) by mouth daily before breakfast. 90 tablet 1   folic acid (FOLVITE) 1 MG tablet Take 1 tablet (1 mg total) by mouth daily. 30  tablet 4   gabapentin (NEURONTIN) 300 MG capsule Take 2 capsules (600 mg total) by mouth 3 (three) times daily. 180 capsule 1   glucose blood (ACCU-CHEK GUIDE TEST) test strip Use as instructed twice daily 100 each 12   glucose blood (ACCU-CHEK GUIDE) test strip Use to check blood sugar 3 times daily. E11.69 100 each 6   insulin glargine (LANTUS SOLOSTAR) 100 UNIT/ML Solostar  Pen Inject 50 Units into the skin daily. 15 mL 11   Insulin Pen Needle 32G X 4 MM MISC Use with Insulin pen 100 each 3   Oxycodone HCl 10 MG TABS Take 1 tablet (10 mg) by mouth every 6 hours as needed for severe pain. 60 tablet 0   sildenafil (VIAGRA) 100 MG tablet Take 1/2 - 1 tablet by mouth daily as needed for erectile dysfunction. (Patient taking differently: Take 100 mg by mouth as needed for erectile dysfunction.) 15 tablet 11   tiZANidine (ZANAFLEX) 2 MG tablet Take 1 tablet (2 mg) by mouth at bedtime. 30 tablet 1   No current facility-administered medications for this visit.    SURGICAL HISTORY:  Past Surgical History:  Procedure Laterality Date   IR BONE TUMOR(S)RF ABLATION  08/01/2022   IR IMAGING GUIDED PORT INSERTION  09/21/2022   IR KYPHO LUMBAR INC FX REDUCE BONE BX UNI/BIL CANNULATION INC/IMAGING  08/01/2022    REVIEW OF SYSTEMS:   Review of Systems  Constitutional: Negative for appetite change, chills, fatigue, fever and unexpected weight change.  HENT: Positive for nasal congestion. Negative for mouth sores, nosebleeds, sore throat and trouble swallowing.   Eyes: Negative for eye problems and icterus.  Respiratory: Negative for cough, hemoptysis, shortness of breath and wheezing.   Cardiovascular: Negative for chest pain and leg swelling.  Gastrointestinal: Negative for abdominal pain, constipation, diarrhea, nausea and vomiting.  Genitourinary: Negative for bladder incontinence, difficulty urinating, dysuria, frequency and hematuria.   Musculoskeletal:  Positive for chronic back pain. Negative for gait problem, neck pain and neck stiffness.  Skin: Negative for itching and rash.  Neurological: Negative for dizziness, extremity weakness, gait problem, headaches, light-headedness and seizures.  Hematological: Negative for adenopathy. Does not bruise/bleed easily.  Psychiatric/Behavioral: Negative for confusion, depression and sleep disturbance. The patient is not  nervous/anxious.     PHYSICAL EXAMINATION:  Blood pressure 117/76, pulse 89, temperature 99.1 F (37.3 C), temperature source Temporal, weight 154 lb 3.2 oz (69.9 kg), SpO2 100%.  ECOG PERFORMANCE STATUS: 1  Physical Exam  Constitutional: Oriented to person, place, and time and well-developed, well-nourished, and in no distress.  HENT:  Head: Normocephalic and atraumatic.  Mouth/Throat: Oropharynx is clear and moist. No oropharyngeal exudate.  Eyes: Conjunctivae are normal. Right eye exhibits no discharge. Left eye exhibits no discharge. No scleral icterus.  Neck: Normal range of motion. Neck supple.  Cardiovascular: Normal rate, regular rhythm, normal heart sounds and intact distal pulses.   Pulmonary/Chest: Effort normal and breath sounds normal. No respiratory distress. No wheezes. No rales.  Abdominal: Soft. Bowel sounds are normal. Exhibits no distension and no mass. There is no tenderness.  Musculoskeletal: Normal range of motion. Exhibits no edema.  Lymphadenopathy:    No cervical adenopathy.  Neurological: Alert and oriented to person, place, and time. Exhibits normal muscle tone. Gait normal. Coordination normal.  Skin: Skin is warm and dry. No rash noted. Not diaphoretic. No erythema. No pallor.  Psychiatric: Mood, memory and judgment normal.  Vitals reviewed.  LABORATORY DATA: Lab Results  Component Value Date  WBC 7.0 10/16/2023   HGB 13.5 10/16/2023   HCT 39.7 10/16/2023   MCV 100.3 (H) 10/16/2023   PLT 246 10/16/2023      Chemistry      Component Value Date/Time   NA 141 09/26/2023 0722   NA 140 12/24/2020 1144   NA 142 08/02/2014 1649   K 3.5 09/26/2023 0722   K 4.2 08/02/2014 1649   CL 109 09/26/2023 0722   CL 109 (H) 08/02/2014 1649   CO2 27 09/26/2023 0722   CO2 21 08/02/2014 1649   BUN 12 09/26/2023 0722   BUN 7 12/24/2020 1144   BUN 11 08/02/2014 1649   CREATININE 0.58 (L) 09/26/2023 0722   CREATININE 0.92 08/02/2014 1649      Component  Value Date/Time   CALCIUM 8.8 (L) 09/26/2023 0722   CALCIUM 8.6 08/02/2014 1649   ALKPHOS 61 09/26/2023 0722   ALKPHOS 82 08/02/2014 1649   AST 28 09/26/2023 0722   ALT 38 09/26/2023 0722   ALT 132 (H) 08/02/2014 1649   BILITOT 0.3 09/26/2023 0722       RADIOGRAPHIC STUDIES:  CT Chest W Contrast  Result Date: 10/16/2023 CLINICAL DATA:  Assess treatment response primary adenocarcinoma of the lung. * Tracking Code: BO * EXAM: CT CHEST WITH CONTRAST TECHNIQUE: Multidetector CT imaging of the chest was performed during intravenous contrast administration. RADIATION DOSE REDUCTION: This exam was performed according to the departmental dose-optimization program which includes automated exposure control, adjustment of the mA and/or kV according to patient size and/or use of iterative reconstruction technique. CONTRAST:  75mL OMNIPAQUE IOHEXOL 300 MG/ML  SOLN COMPARISON:  CT 07/31/2023 and older. FINDINGS: Cardiovascular: Right upper chest port is accessed. Tip along the right atrium. Trace pericardial fluid. Heart itself is nonenlarged. Bovine type aortic arch, normal variant. Scattered partially calcified atherosclerotic plaque along the aorta and branch vessels. Mediastinum/Nodes: Patulous esophagus with some luminal fluid. Preserved thyroid gland. No specific abnormal lymph node enlargement seen in the axillary regions. No defined abnormal nodes in the mediastinum but there is some mediastinal soft tissue edema. Small areas of nodes in the hilum bilaterally as well are nonpathologic by size criteria. Unchanged from previous. Mild soft tissue thickening as well in the hilum. Lungs/Pleura: No consolidation, pneumothorax or effusion. Scattered paraseptal and centrilobular emphysematous lung changes. Apical pleural thickening and scarring. No developing dominant lung mass. Upper Abdomen: Stable nodular thickening of the right adrenal gland. Fatty liver infiltration. Musculoskeletal: Slight curvature of the  spine with some degenerative changes. Old bilateral rib fractures are identified. Old right clavicle fracture. Stable areas of sclerosis involving the T3 vertebral level. Other areas of lucency and sclerosis. IMPRESSION: No significant interval change. Stable areas of bony sclerosis. No developing mass lesion, fluid collection or lymph node enlargement. Stable edema in the mediastinum and thickening as well as some soft tissue thickening along the hilum. Patulous fluid-filled esophagus. Please correlate with symptomatology. Aortic Atherosclerosis (ICD10-I70.0) and Emphysema (ICD10-J43.9). Electronically Signed   By: Karen Kays M.D.   On: 10/16/2023 11:09     ASSESSMENT/PLAN:  This is a very pleasant 57 year old African-American male diagnosed with stage IV (T3, N2, M1 C) non-small cell lung cancer, adenocarcinoma.  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  He underwent biopsy and kyphoplasty to the L2 and to the brain metastasis.  He was diagnosed in September 2023.  His molecular studies showed no actionable mutations and his PD-L1 expression is 90%.  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    He underwent palliative radiation to L2 compression fracture as well as a solitary brain metastasis with SRS.    He is currently being treated with palliative systemic chemotherapy with carboplatin for an AUC of 5, Alimta 500 mg per metered square, Keytruda 200 mg IV every 3 weeks.  He status post 19 cycles and he tolerated well without any concerning adverse side effects.  Starting from cycle #5 he started maintenance Alimta and Keytruda.  IV every 3 weeks   Radiation oncology is monitoring his brain MRI and lumbar spine closely  every few months. He recently had a brain MRI and follow up with radiation oncology  The patient was seen with Dr. Arbutus Ped today.  Dr. Arbutus Ped personally and independently reviewed the scan and discussed results with the patient today.  The scan showed no evidence of disease progression.  Dr. Arbutus Ped recommends he continue on the same treatment at the same dose.   Labs were reviewed.  Recommend that he proceed with cycle #20 today as scheduled.  We will see him back for follow-up visit in 3 weeks for evaluation repeat blood work before undergoing cycle #21.    The scan mentioned some Patulous fluid-filled esophagus. The patient does mention some reflux, especially if laying down. We discussed sitting upright after eating. If any concerning symptoms or was very symptomatic, next step would be referral to GI. We will hold off on referral at this time.   For his congestion, this only occurs when he is in his new room in the house he moved into. We discussed getting an air purifier/filter and trying to use an antihistamine daily to see if it helps.   The patient was advised to call immediately if he has any concerning symptoms in the interval. The patient voices understanding of current disease status and treatment options and is in agreement with the current care plan. All questions were answered. The patient knows to call the clinic with any problems, questions or concerns. We can certainly see the patient much sooner if necessary     Orders Placed This Encounter  Procedures   CBC with Differential (Cancer Center Only)    Standing Status:   Future    Standing Expiration Date:   01/07/2025   CMP (Cancer Center only)    Standing Status:   Future    Standing Expiration Date:   01/07/2025   T4    Standing Status:   Future    Standing Expiration Date:   01/07/2025   TSH    Standing Status:   Future    Standing Expiration Date:   01/07/2025   CBC with Differential (Cancer Center Only)     Standing Status:   Future    Standing Expiration Date:   01/28/2025   CMP (Cancer Center only)    Standing Status:   Future    Standing Expiration Date:   01/28/2025   CBC with Differential (Cancer Center Only)    Standing Status:   Future    Standing Expiration Date:   02/18/2025   CMP (Cancer Center only)    Standing Status:   Future    Standing Expiration Date:   02/18/2025   CBC with Differential (Cancer Center Only)    Standing Status:  Future    Standing Expiration Date:   03/11/2025   CMP (Cancer Center only)    Standing Status:   Future    Standing Expiration Date:   03/11/2025   T4    Standing Status:   Future    Standing Expiration Date:   03/11/2025   TSH    Standing Status:   Future    Standing Expiration Date:   03/11/2025   CBC with Differential (Cancer Center Only)    Standing Status:   Future    Standing Expiration Date:   04/01/2025   CMP (Cancer Center only)    Standing Status:   Future    Standing Expiration Date:   04/01/2025   CBC with Differential (Cancer Center Only)    Standing Status:   Future    Standing Expiration Date:   04/22/2025   CMP (Cancer Center only)    Standing Status:   Future    Standing Expiration Date:   04/22/2025   CBC with Differential (Cancer Center Only)    Standing Status:   Future    Standing Expiration Date:   05/13/2025   CMP (Cancer Center only)    Standing Status:   Future    Standing Expiration Date:   05/13/2025   T4    Standing Status:   Future    Standing Expiration Date:   05/13/2025   TSH    Standing Status:   Future    Standing Expiration Date:   05/13/2025   CBC with Differential (Cancer Center Only)    Standing Status:   Future    Standing Expiration Date:   06/03/2025   CMP (Cancer Center only)    Standing Status:   Future    Standing Expiration Date:   06/03/2025   CBC with Differential (Cancer Center Only)    Standing Status:   Future    Standing Expiration Date:   06/24/2025   CMP (Cancer Center only)    Standing  Status:   Future    Standing Expiration Date:   06/24/2025   CBC with Differential (Cancer Center Only)    Standing Status:   Future    Standing Expiration Date:   07/15/2025   CMP (Cancer Center only)    Standing Status:   Future    Standing Expiration Date:   07/15/2025   T4    Standing Status:   Future    Standing Expiration Date:   07/15/2025   TSH    Standing Status:   Future    Standing Expiration Date:   07/15/2025   CBC with Differential (Cancer Center Only)    Standing Status:   Future    Standing Expiration Date:   08/05/2025   CMP (Cancer Center only)    Standing Status:   Future    Standing Expiration Date:   08/05/2025   CBC with Differential (Cancer Center Only)    Standing Status:   Future    Standing Expiration Date:   08/26/2025   CMP (Cancer Center only)    Standing Status:   Future    Standing Expiration Date:   08/26/2025      Johnette Abraham Aeisha Minarik, PA-C 10/16/23\  ADDENDUM: Hematology/Oncology Attending: I had a face-to-face encounter with the patient today.  I reviewed his record, lab, scan and recommended his care plan.  This is a very pleasant 57 years old African-American male with a stage IV non-small cell lung cancer, adenocarcinoma diagnosed in September 2023.  He has  no actionable mutations and PD-L1 expression of 90%.  The patient is status post palliative radiotherapy to the L2 spine as well as solitary brain metastasis under the care of Dr. Kathrynn Running.  He started palliative systemic chemotherapy initially with carboplatin, Alimta and Keytruda for 4 cycles followed by maintenance treatment with Alimta and Keytruda starting from cycle #5.  He is status post 19 cycles of treatment.  He has been tolerating this treatment well with no concerning adverse effects. He had repeat CT scan of the chest, abdomen and pelvis performed recently.  I personally and independently reviewed the scan and discussed the result with the patient today. His scan showed no concerning  findings for disease progression. I recommended for him to continue on his current maintenance treatment with Alimta and Keytruda and he will proceed with cycle #20 today. He will come back for follow-up visit in 3 weeks for evaluation before the next cycle of his treatment. The patient was advised to call immediately if he has any other concerning symptoms in the interval. The total time spent in the appointment was 30 minutes. Disclaimer: This note was dictated with voice recognition software. Similar sounding words can inadvertently be transcribed and may be missed upon review. Lajuana Matte, MD

## 2023-10-12 ENCOUNTER — Ambulatory Visit: Payer: Medicaid Other | Attending: Family Medicine | Admitting: Family Medicine

## 2023-10-12 ENCOUNTER — Other Ambulatory Visit (HOSPITAL_COMMUNITY): Payer: Self-pay

## 2023-10-12 ENCOUNTER — Encounter: Payer: Self-pay | Admitting: Family Medicine

## 2023-10-12 ENCOUNTER — Encounter: Payer: Self-pay | Admitting: Internal Medicine

## 2023-10-12 ENCOUNTER — Other Ambulatory Visit: Payer: Self-pay

## 2023-10-12 VITALS — BP 125/75 | HR 85 | Ht 68.0 in | Wt 150.4 lb

## 2023-10-12 DIAGNOSIS — Z1211 Encounter for screening for malignant neoplasm of colon: Secondary | ICD-10-CM | POA: Diagnosis not present

## 2023-10-12 DIAGNOSIS — C7951 Secondary malignant neoplasm of bone: Secondary | ICD-10-CM | POA: Diagnosis not present

## 2023-10-12 DIAGNOSIS — Z23 Encounter for immunization: Secondary | ICD-10-CM

## 2023-10-12 DIAGNOSIS — Z7984 Long term (current) use of oral hypoglycemic drugs: Secondary | ICD-10-CM

## 2023-10-12 DIAGNOSIS — E1142 Type 2 diabetes mellitus with diabetic polyneuropathy: Secondary | ICD-10-CM

## 2023-10-12 DIAGNOSIS — Z794 Long term (current) use of insulin: Secondary | ICD-10-CM

## 2023-10-12 DIAGNOSIS — C3490 Malignant neoplasm of unspecified part of unspecified bronchus or lung: Secondary | ICD-10-CM

## 2023-10-12 DIAGNOSIS — R1909 Other intra-abdominal and pelvic swelling, mass and lump: Secondary | ICD-10-CM

## 2023-10-12 MED ORDER — ACCU-CHEK GUIDE TEST VI STRP
ORAL_STRIP | 12 refills | Status: DC
  Filled 2023-10-12 (×2): qty 100, 50d supply, fill #0
  Filled 2023-11-24: qty 100, 50d supply, fill #1
  Filled 2023-11-27 – 2024-03-14 (×3): qty 100, 50d supply, fill #0
  Filled 2024-04-23: qty 100, 50d supply, fill #1
  Filled 2024-06-29: qty 100, 50d supply, fill #2

## 2023-10-12 NOTE — Progress Notes (Signed)
Subjective:  Patient ID: Samuel [name], male    DOB: 1966/07/14  Age: 57 y.o. MRN: [id number]  CC: Cyst (Around [employer] area)   HPI Samuel [name] is a 57 y.o. 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   Interval History: Discussed the use of AI scribe software for clinical note transcription with the patient, who gave verbal consent to proceed.  He presents with a new abdominal mass around the navel. He describes it as 'hard tissue' and denies associated pain, nausea, vomiting, or constipation. The mass was first noticed in August and has not resolved. A CT scan was performed last month, which did not identify the mass but did show hepatic steatosis, no acute finding in the abdomen and pelvis, evidence of metastatic disease in lumbar spine.  Regarding his diabetes, the patient reports that his insulin regimen has been disrupted due to a recent move and loss of his insulin. He has been rationing his remaining insulin until he can obtain a refill. His last A1c was 8.4.  The patient is currently undergoing chemotherapy every three weeks, with his next session scheduled for the upcoming Monday.        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 06/13/2022 A1C 13.8, FSBG 414 Start  empagliflozin 5mg /metformin 1000 daily, levemir 20 qhs. (Samples given) Will apply for medassist for pharmacy    Past Surgical History:  Procedure Laterality Date   IR BONE TUMOR(S)RF ABLATION  08/01/2022   IR IMAGING GUIDED PORT INSERTION  09/21/2022   IR KYPHO LUMBAR INC FX REDUCE BONE BX UNI/BIL CANNULATION INC/IMAGING  08/01/2022    No family history on file.  Social History   Socioeconomic History   Marital status: Single    Spouse name: Not on file   Number of children: Not on file   Years of education: Not on file   Highest education level: Associate degree: academic program  Occupational History   Not on file  Tobacco Use   Smoking status: Every Day    Current packs/day: 0.00    Types: Cigarettes    Last attempt to quit: 08/17/2022    Years since quitting: 1.1   Smokeless tobacco: Never  Vaping Use   Vaping status: Never Used  Substance and Sexual Activity   Alcohol use: Yes    Alcohol/week: 50.0 standard drinks of alcohol    Types: 50 Cans of beer per week    Comment: last thursday - a couple of beers   Drug use: No   Sexual activity: Yes  Other Topics Concern   Not on file  Social History Narrative   Not on file   Social Determinants of Health   Financial Resource Strain: Medium Risk (03/17/2023)   Overall Financial Resource Strain (CARDIA)    Difficulty of Paying Living  Expenses: Somewhat hard  Food Insecurity: No Food Insecurity (08/28/2023)   Hunger Vital Sign    Worried About Running Out of Food in the Last Year: Never true    Ran Out of Food in the Last Year: Never true  Transportation Needs: No Transportation Needs (08/28/2023)   PRAPARE - Administrator, Civil Service (Medical): No    Lack of Transportation (Non-Medical): No  Physical Activity: Insufficiently Active (03/17/2023)   Exercise Vital Sign    Days of Exercise per Week: 2 days    Minutes of Exercise per Session: 20 min  Stress: Stress Concern Present (03/17/2023)   [employer] of Occupational Health - Occupational Stress Questionnaire    Feeling of Stress : To some extent  Social Connections: Unknown (05/09/2023)   Received from [place], Henriette Combs   Social Connections    How often do you feel lonely or isolated from those around you? (Adult - for ages 54 years and over): Not on file  Recent Concern: Social Connections - Moderately Isolated (03/17/2023)   Social Connection and Isolation Panel [NHANES]    Frequency of Communication with Friends and Family: More than three times a week    Frequency of Social Gatherings with Friends and Family: More than three times a week    Attends Religious Services: More than 4 times per year    Active Member of [employer] or Organizations: No    Attends Engineer, structural: Not on file    Marital Status: Divorced    Allergies  Allergen Reactions   Penicillins Hives   Percocet [Oxycodone-Acetaminophen] Nausea Only and Other (See Comments)    Pt states the pill is hard to swallow. States he was told it may be the coating on the tablet that he is allergic to.     Outpatient Medications Prior to Visit  Medication Sig Dispense Refill   Accu-Chek Softclix Lancets lancets Use to check blood sugar 3 times daily. E11.69 100 each 6   acetaminophen (TYLENOL) 500 MG tablet Take 1,000 mg by mouth in the morning and at bedtime.     atorvastatin (LIPITOR) 40 MG tablet Take 1 tablet (40 mg total) by mouth daily. 90 tablet 1   buPROPion (WELLBUTRIN XL) 150 MG 24 hr tablet Take 1 tablet (150 mg total) by mouth daily. 90 tablet 1   empagliflozin (JARDIANCE) 25 MG TABS tablet Take 1 tablet (25 mg total) by mouth daily before breakfast. 90 tablet 1   folic acid (FOLVITE) 1 MG tablet Take 1 tablet (1 mg total) by mouth daily. 30 tablet 4   gabapentin (NEURONTIN) 300 MG capsule Take 2 capsules (600 mg total) by mouth 3 (three) times daily. 180 capsule 1   glucose blood (ACCU-CHEK GUIDE) test strip Use to check blood sugar 3 times  daily. E11.69 100 each 6   insulin glargine (LANTUS SOLOSTAR) 100 UNIT/ML Solostar Pen Inject 50 Units into the skin daily. 15 mL 11   Insulin Pen Needle 32G X 4 MM MISC Use with Insulin pen 100 each 3   Oxycodone HCl 10 MG TABS Take 1 tablet (10 mg) by mouth every 6 hours as needed for severe pain. 60 tablet 0   sildenafil (VIAGRA) 100 MG tablet Take 1/2 - 1 tablet by mouth daily as needed for erectile dysfunction. (Patient taking differently: Take 100 mg by mouth as needed for erectile dysfunction.) 15 tablet 11   tiZANidine (ZANAFLEX) 2 MG tablet Take 1 tablet (2 mg) by mouth at  bedtime. 30 tablet 1   No facility-administered medications prior to visit.     ROS Review of Systems  Constitutional:  Negative for activity change and appetite change.  HENT:  Negative for sinus pressure and sore throat.   Respiratory:  Negative for chest tightness, shortness of breath and wheezing.   Cardiovascular:  Negative for chest pain and palpitations.  Gastrointestinal:  Negative for abdominal distention, abdominal pain and constipation.  Genitourinary: Negative.   Musculoskeletal:        See HPI  Psychiatric/Behavioral:  Negative for behavioral problems and dysphoric mood.     Objective:  BP 125/75   Pulse 85   Ht 5\' 8"  (1.727 m)   Wt 150 lb 6.4 oz (68.2 kg)   SpO2 94%   BMI 22.87 kg/m      10/12/2023    8:38 AM 09/26/2023    8:01 AM 09/06/2023    8:08 AM  BP/Weight  Systolic BP 125 129 124  Diastolic BP 75 85 72  Wt. (Lbs) 150.4 153.4 152  BMI 22.87 kg/m2 23.32 kg/m2 23.11 kg/m2      Physical Exam Constitutional:      Appearance: He is well-developed.  Cardiovascular:     Rate and Rhythm: Normal rate.     Heart sounds: Normal heart sounds. No murmur heard. Pulmonary:     Effort: Pulmonary effort is normal.     Breath sounds: Normal breath sounds. No wheezing or rales.  Chest:     Chest wall: No tenderness.  Abdominal:     General: Bowel sounds are normal. There is no  distension.     Palpations: Abdomen is soft. There is mass (hard tissue surrounding umbilicus, not tender).     Tenderness: There is no abdominal tenderness.  Musculoskeletal:        General: Tenderness (slight TTP thoracolumbar spine) present. Normal range of motion.     Right lower leg: No edema.     Left lower leg: No edema.  Neurological:     Mental Status: He is alert and oriented to person, place, and time.  Psychiatric:        Mood and Affect: Mood normal.        Latest Ref Rng & Units 09/26/2023    7:22 AM 09/06/2023    7:48 AM 08/29/2023    3:54 AM  CMP  Glucose 70 - 99 mg/dL 94  64  88   BUN 6 - 20 mg/dL 12  12  15    Creatinine 0.61 - 1.24 mg/dL 1.61  0.96  0.45   Sodium 135 - 145 mmol/L 141  140  138   Potassium 3.5 - 5.1 mmol/L 3.5  3.5  3.5   Chloride 98 - 111 mmol/L 109  107  107   CO2 22 - 32 mmol/L 27  25  25    Calcium 8.9 - 10.3 mg/dL 8.8  8.9  8.5   Total Protein 6.5 - 8.1 g/dL 6.8  7.2    Total Bilirubin <1.2 mg/dL 0.3  0.3    Alkaline Phos 38 - 126 U/L 61  57    AST 15 - 41 U/L 28  40    ALT 0 - 44 U/L 38  51      Lipid Panel     Component Value Date/Time   CHOL 133 07/12/2023 1116   TRIG 64 07/12/2023 1116   HDL 41 07/12/2023 1116   CHOLHDL 3.2 07/12/2023 1116   CHOLHDL 2.4 03/11/2021 1843  VLDL 23 03/11/2021 1843   LDLCALC 79 07/12/2023 1116    CBC    Component Value Date/Time   WBC 5.0 09/26/2023 0722   WBC 5.3 08/29/2023 0354   RBC 3.79 (L) 09/26/2023 0722   HGB 12.7 (L) 09/26/2023 0722   HGB 14.7 12/24/2020 1144   HCT 37.5 (L) 09/26/2023 0722   HCT 44.3 12/24/2020 1144   PLT 246 09/26/2023 0722   PLT 201 12/24/2020 1144   MCV 98.9 09/26/2023 0722   MCV 94 12/24/2020 1144   MCV 96 08/02/2014 1649   MCH 33.5 09/26/2023 0722   MCHC 33.9 09/26/2023 0722   RDW 13.0 09/26/2023 0722   RDW 12.3 12/24/2020 1144   RDW 14.3 08/02/2014 1649   LYMPHSABS 1.0 09/26/2023 0722   LYMPHSABS 2.5 12/24/2020 1144   LYMPHSABS 1.6 09/14/2012 0541    MONOABS 0.5 09/26/2023 0722   MONOABS 1.0 09/14/2012 0541   EOSABS 0.1 09/26/2023 0722   EOSABS 0.1 12/24/2020 1144   EOSABS 0.0 09/14/2012 0541   BASOSABS 0.0 09/26/2023 0722   BASOSABS 0.0 12/24/2020 1144   BASOSABS 0.0 09/14/2012 0541    Lab Results  Component Value Date   HGBA1C 8.4 (H) 08/28/2023    Assessment & Plan:      Abdominal Mass New, non-painful mass around the navel noted since August. CT scan last month did not reveal any abnormalities. On examination, the mass feels like scar tissue. No prior abdominal surgeries. - No immediate intervention. Monitor for changes.  Type 2 Diabetes Mellitus Last A1c was 8.4 in October. Patient reports losing insulin during a recent move and has been rationing remaining supply. - Refill Lantus 50 units daily. Patient to pick up from [facility]. - Refill Jardiance 25mg  daily. - Refill Accu-Check guide test strips. - Follow up in 3 months to repeat A1c.  Metastatic Lung Cancer Patient is currently under the care of oncology and receiving chemotherapy every three weeks. Cancer has spread to the lumbar spine. - Continue current oncology management. -He is currently receiving opioid analgesics  Colon Cancer Screening Due for colonoscopy, but patient prefers less invasive stool card test. - Arrange for stool card test to be sent to patient's home.  Follow-up In 3 months for diabetes management and to repeat A1c.          Meds ordered this encounter  Medications   glucose blood (ACCU-CHEK GUIDE TEST) test strip    Sig: Use as instructed twice daily    Dispense:  100 each    Refill:  12    Follow-up: Return in about 3 months (around 01/12/2024).       Hoy Register, MD, FAAFP. [facility] [place], [place] [phone]   10/12/2023, 12:49 PM

## 2023-10-12 NOTE — Patient Instructions (Signed)
VISIT SUMMARY:  During today's visit, we discussed your new abdominal mass, diabetes management, ongoing cancer treatment, and colon cancer screening. We reviewed your recent CT scan and current medications, and we have made plans to address your immediate health needs.  YOUR PLAN:  -ABDOMINAL MASS: You have a new, non-painful mass around your navel that feels like scar tissue. Since it is not causing any symptoms and the CT scan did not show any abnormalities, we will monitor it for any changes.  -DIABETES MELLITUS: Your diabetes management has been disrupted due to losing your insulin during a recent move. Diabetes is a condition where your blood sugar levels are too high. We have refilled your prescriptions for Lantus, Jardiance, and Accu-Check test strips. Please pick them up from [facility]. We will follow up in 3 months to check your A1c levels again.  -METASTATIC CANCER: Your cancer has spread to your lumbar spine, and you are currently receiving chemotherapy every three weeks. Metastatic cancer means that the cancer has spread from its original site to other parts of the body. Continue with your current oncology management.  -COLON CANCER SCREENING: You are due for a colon cancer screening. Colon cancer screening helps detect cancer early. Since you prefer a less invasive method, we will send a stool card test to your home.  INSTRUCTIONS:  Please pick up your diabetes medications from [facility]. We will follow up in 3 months to repeat your A1c test and review your diabetes management. Continue with your scheduled chemotherapy sessions and monitor the abdominal mass for any changes. Expect to receive a stool card test at your home for colon cancer screening.

## 2023-10-16 ENCOUNTER — Inpatient Hospital Stay: Payer: Medicaid Other

## 2023-10-16 ENCOUNTER — Inpatient Hospital Stay (HOSPITAL_BASED_OUTPATIENT_CLINIC_OR_DEPARTMENT_OTHER): Payer: Medicaid Other | Admitting: Physician Assistant

## 2023-10-16 VITALS — BP 117/76 | HR 89 | Temp 99.1°F | Wt 154.2 lb

## 2023-10-16 VITALS — BP 129/83 | HR 85 | Temp 98.8°F | Resp 18

## 2023-10-16 DIAGNOSIS — Z5111 Encounter for antineoplastic chemotherapy: Secondary | ICD-10-CM | POA: Diagnosis not present

## 2023-10-16 DIAGNOSIS — C3490 Malignant neoplasm of unspecified part of unspecified bronchus or lung: Secondary | ICD-10-CM

## 2023-10-16 DIAGNOSIS — Z5112 Encounter for antineoplastic immunotherapy: Secondary | ICD-10-CM

## 2023-10-16 DIAGNOSIS — C7931 Secondary malignant neoplasm of brain: Secondary | ICD-10-CM

## 2023-10-16 LAB — CBC WITH DIFFERENTIAL (CANCER CENTER ONLY)
Abs Immature Granulocytes: 0.02 10*3/uL (ref 0.00–0.07)
Basophils Absolute: 0 10*3/uL (ref 0.0–0.1)
Basophils Relative: 0 %
Eosinophils Absolute: 0.1 10*3/uL (ref 0.0–0.5)
Eosinophils Relative: 1 %
HCT: 39.7 % (ref 39.0–52.0)
Hemoglobin: 13.5 g/dL (ref 13.0–17.0)
Immature Granulocytes: 0 %
Lymphocytes Relative: 18 %
Lymphs Abs: 1.3 10*3/uL (ref 0.7–4.0)
MCH: 34.1 pg — ABNORMAL HIGH (ref 26.0–34.0)
MCHC: 34 g/dL (ref 30.0–36.0)
MCV: 100.3 fL — ABNORMAL HIGH (ref 80.0–100.0)
Monocytes Absolute: 0.7 10*3/uL (ref 0.1–1.0)
Monocytes Relative: 9 %
Neutro Abs: 5 10*3/uL (ref 1.7–7.7)
Neutrophils Relative %: 72 %
Platelet Count: 246 10*3/uL (ref 150–400)
RBC: 3.96 MIL/uL — ABNORMAL LOW (ref 4.22–5.81)
RDW: 13.1 % (ref 11.5–15.5)
WBC Count: 7 10*3/uL (ref 4.0–10.5)
nRBC: 0 % (ref 0.0–0.2)

## 2023-10-16 LAB — CMP (CANCER CENTER ONLY)
ALT: 45 U/L — ABNORMAL HIGH (ref 0–44)
AST: 31 U/L (ref 15–41)
Albumin: 3.9 g/dL (ref 3.5–5.0)
Alkaline Phosphatase: 64 U/L (ref 38–126)
Anion gap: 7 (ref 5–15)
BUN: 19 mg/dL (ref 6–20)
CO2: 26 mmol/L (ref 22–32)
Calcium: 9.5 mg/dL (ref 8.9–10.3)
Chloride: 104 mmol/L (ref 98–111)
Creatinine: 0.61 mg/dL (ref 0.61–1.24)
GFR, Estimated: >60 mL/min (ref >60)
Glucose, Bld: 205 mg/dL — ABNORMAL HIGH (ref 70–99)
Potassium: 3.7 mmol/L (ref 3.5–5.1)
Sodium: 137 mmol/L (ref 135–145)
Total Bilirubin: 0.3 mg/dL (ref <1.2)
Total Protein: 7.5 g/dL (ref 6.5–8.1)

## 2023-10-16 MED ORDER — SODIUM CHLORIDE 0.9% FLUSH
10.0000 mL | Freq: Once | INTRAVENOUS | Status: AC
  Administered 2023-10-16: 10 mL

## 2023-10-16 MED ORDER — PROCHLORPERAZINE MALEATE 10 MG PO TABS
10.0000 mg | ORAL_TABLET | Freq: Once | ORAL | Status: AC
Start: 2023-10-16 — End: 2023-10-16
  Administered 2023-10-16: 10 mg via ORAL
  Filled 2023-10-16: qty 1

## 2023-10-16 MED ORDER — SODIUM CHLORIDE 0.9 % IV SOLN
200.0000 mg | Freq: Once | INTRAVENOUS | Status: AC
  Administered 2023-10-16: 200 mg via INTRAVENOUS
  Filled 2023-10-16: qty 200

## 2023-10-16 MED ORDER — SODIUM CHLORIDE 0.9% FLUSH
10.0000 mL | INTRAVENOUS | Status: DC | PRN
  Administered 2023-10-16: 10 mL

## 2023-10-16 MED ORDER — SODIUM CHLORIDE 0.9 % IV SOLN
Freq: Once | INTRAVENOUS | Status: AC
Start: 2023-10-16 — End: 2023-10-16

## 2023-10-16 MED ORDER — SODIUM CHLORIDE 0.9 % IV SOLN
500.0000 mg/m2 | Freq: Once | INTRAVENOUS | Status: AC
  Administered 2023-10-16: 900 mg via INTRAVENOUS
  Filled 2023-10-16: qty 20

## 2023-10-16 MED ORDER — HEPARIN SOD (PORK) LOCK FLUSH 100 UNIT/ML IV SOLN
500.0000 [IU] | Freq: Once | INTRAVENOUS | Status: AC | PRN
  Administered 2023-10-16: 500 [IU]

## 2023-10-16 NOTE — Patient Instructions (Signed)
 [place] [facility] - A DEPT OF [facility]  Discharge Instructions: Thank you for choosing [facility] to provide your oncology and hematology care.   If you have a lab appointment with the Cancer Center, please go directly to the Cancer Center and check in at the registration area.   Wear comfortable clothing and clothing appropriate for easy access to any Portacath or PICC line.   We strive to give you quality time with your provider. You may need to reschedule your appointment if you arrive late (15 or more minutes).  Arriving late affects you and other patients whose appointments are after yours.  Also, if you miss three or more appointments without notifying the office, you may be dismissed from the clinic at the provider's discretion.      For prescription refill requests, have your pharmacy contact our office and allow 72 hours for refills to be completed.    Today you received the following chemotherapy and/or immunotherapy agents Keytruda / Alimta      To help prevent nausea and vomiting after your treatment, we encourage you to take your nausea medication as directed.  BELOW ARE SYMPTOMS THAT SHOULD BE REPORTED IMMEDIATELY: *FEVER GREATER THAN 100.4 F (38 C) OR HIGHER *CHILLS OR SWEATING *NAUSEA AND VOMITING THAT IS NOT CONTROLLED WITH YOUR NAUSEA MEDICATION *UNUSUAL SHORTNESS OF BREATH *UNUSUAL BRUISING OR BLEEDING *URINARY PROBLEMS (pain or burning when urinating, or frequent urination) *BOWEL PROBLEMS (unusual diarrhea, constipation, pain near the anus) TENDERNESS IN MOUTH AND THROAT WITH OR WITHOUT PRESENCE OF ULCERS (sore throat, sores in mouth, or a toothache) UNUSUAL RASH, SWELLING OR PAIN  UNUSUAL VAGINAL DISCHARGE OR ITCHING   Items with * indicate a potential emergency and should be followed up as soon as possible or go to the Emergency Department if any problems should occur.  Please show the CHEMOTHERAPY ALERT CARD or  IMMUNOTHERAPY ALERT CARD at check-in to the Emergency Department and triage nurse.  Should you have questions after your visit or need to cancel or reschedule your appointment, please contact [place] [facility] - A DEPT OF [facility]  Dept: [phone]  and follow the prompts.  Office hours are 8:00 a.m. to 4:30 p.m. Monday - Friday. Please note that voicemails left after 4:00 p.m. may not be returned until the following business day.  We are closed weekends and major holidays. You have access to a nurse at all times for urgent questions. Please call the main number to the clinic Dept: [phone] and follow the prompts.   For any non-urgent questions, you may also contact your provider using MyChart. We now offer e-Visits for anyone 98 and older to request care online for non-urgent symptoms. For details visit [email or web address].   Also download the MyChart app! Go to the app store, search "MyChart", open the app, select [facility], and log in with your MyChart username and password.

## 2023-10-30 ENCOUNTER — Other Ambulatory Visit: Payer: Self-pay | Admitting: Nurse Practitioner

## 2023-10-30 DIAGNOSIS — G893 Neoplasm related pain (acute) (chronic): Secondary | ICD-10-CM

## 2023-10-30 DIAGNOSIS — Z515 Encounter for palliative care: Secondary | ICD-10-CM

## 2023-10-30 DIAGNOSIS — C7951 Secondary malignant neoplasm of bone: Secondary | ICD-10-CM

## 2023-10-30 DIAGNOSIS — C3490 Malignant neoplasm of unspecified part of unspecified bronchus or lung: Secondary | ICD-10-CM

## 2023-10-30 DIAGNOSIS — M792 Neuralgia and neuritis, unspecified: Secondary | ICD-10-CM

## 2023-10-31 ENCOUNTER — Other Ambulatory Visit (HOSPITAL_COMMUNITY): Payer: Self-pay

## 2023-10-31 MED ORDER — OXYCODONE HCL 10 MG PO TABS
10.0000 mg | ORAL_TABLET | Freq: Four times a day (QID) | ORAL | 0 refills | Status: DC | PRN
  Filled 2023-10-31: qty 60, 15d supply, fill #0

## 2023-11-02 ENCOUNTER — Other Ambulatory Visit: Payer: Self-pay | Admitting: Family Medicine

## 2023-11-02 ENCOUNTER — Other Ambulatory Visit (HOSPITAL_COMMUNITY): Payer: Self-pay

## 2023-11-02 ENCOUNTER — Encounter: Payer: Self-pay | Admitting: Internal Medicine

## 2023-11-02 ENCOUNTER — Encounter: Payer: Self-pay | Admitting: Family Medicine

## 2023-11-02 MED ORDER — TADALAFIL 10 MG PO TABS
10.0000 mg | ORAL_TABLET | ORAL | 2 refills | Status: DC | PRN
  Filled 2023-11-02: qty 10, 20d supply, fill #0
  Filled 2023-11-24 – 2023-11-27 (×2): qty 10, 20d supply, fill #1
  Filled 2024-01-02: qty 10, 20d supply, fill #2

## 2023-11-03 NOTE — Progress Notes (Unsigned)
Palliative Medicine [facility]  [phone] [phone]) [phone]   Name: Samuel [name] Date: 11/03/2023 MRN: [id number]  DOB: [birth year]  Patient Care Team: Samuel Register, MD as PCP - General (Family Medicine) Pickenpack-Cousar, Arty Baumgartner, NP as Nurse Practitioner (Nurse Practitioner)   INTERVAL HISTORY: Samuel [name] is a 57 y.o. 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  Palliative ask to see for symptom management and goals of care.   SOCIAL HISTORY:     reports that he has been smoking cigarettes. He has never used smokeless tobacco. He reports current alcohol use of about 50.0 standard drinks of alcohol per week. He reports that he does not use drugs.  ADVANCE DIRECTIVES:  Patient does not have an advanced directives. Education and packet provided. He has expressed interest in completing. States his sister, Samuel [name] would be his designated Clinical research associate.   CODE STATUS: Full code  PAST MEDICAL HISTORY: Past Medical History:  Diagnosis Date  . Alcohol use disorder, severe, dependence (HCC) 12/26/2016  . Diabetes mellitus without complication (HCC)   . Metastatic cancer to brain (HCC) 08/05/2022  . 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 Pancreatitis unk  . Primary lung adenocarcinoma (HCC) 08/05/2022  . Suicidal ideation   . Type 2 diabetes mellitus with hyperglycemia, without long-term current use of insulin (HCC) 06/13/2022   Formatting of this note might be different from the original. 06/13/2022 A1C 13.8, FSBG 414 Start empagliflozin 5mg /metformin 1000 daily, levemir 20 qhs. (Samples given) Will apply for medassist for pharmacy    ALLERGIES:  is allergic to penicillins and percocet [oxycodone-acetaminophen].  MEDICATIONS:  Current Outpatient Medications  Medication Sig Dispense  Refill  . Accu-Chek Softclix Lancets lancets Use to check blood sugar 3 times daily. E11.69 100 each 6  . acetaminophen (TYLENOL) 500 MG tablet Take 1,000 mg by mouth in the morning and at bedtime.    [name] atorvastatin (LIPITOR) 40 MG tablet Take 1 tablet (40 mg total) by mouth daily. 90 tablet 1  . buPROPion (WELLBUTRIN XL) 150 MG 24 hr tablet Take 1 tablet (150 mg total) by mouth daily. 90 tablet 1  . empagliflozin (JARDIANCE) 25 MG TABS tablet Take 1 tablet (25 mg total) by mouth daily before breakfast. 90 tablet 1  . folic acid (FOLVITE) 1 MG tablet Take 1 tablet (1 mg total) by mouth daily. 30 tablet 4  . gabapentin (NEURONTIN) 300 MG capsule Take 2 capsules (600 mg total) by mouth 3 (three) times daily. 180 capsule 1  . glucose blood (ACCU-CHEK GUIDE TEST) test strip Use as instructed twice daily 100 each 12  . glucose blood (ACCU-CHEK GUIDE) test strip Use to check blood sugar 3 times daily. E11.69 100 each 6  . insulin glargine (LANTUS SOLOSTAR) 100 UNIT/ML Solostar Pen Inject 50 Units into the skin daily. 15 mL 11  . Insulin Pen Needle 32G X 4 MM MISC Use with Insulin pen 100 each 3  . Oxycodone HCl 10 MG TABS Take 1 tablet (10 mg) by mouth every 6 hours as needed for severe pain. 60 tablet 0  . tadalafil (CIALIS) 10 MG tablet Take 1 tablet (10 mg total) by mouth every other day as needed for erectile dysfunction. 10 tablet 2  . tiZANidine (ZANAFLEX) 2 MG tablet Take 1 tablet (2 mg) by mouth at bedtime. 30 tablet 1  No current facility-administered medications for this visit.    VITAL SIGNS: There were no vitals taken for this visit. There were no vitals filed for this visit.  Estimated body mass index is 23.45 kg/m as calculated from the following:   Height as of 10/12/23: 5\' 8"  (1.727 m).   Weight as of 10/16/23: 154 lb 3.2 oz (69.9 kg).   PERFORMANCE STATUS (ECOG) : 1 - Symptomatic but completely ambulatory   Physical Exam General: NAD Cardiovascular: regular rate and  rhythm Pulmonary:normal breathing pattern  Extremities: no edema, no joint deformities, brace in place  Skin: no rashes Neurological: AAO x4  IMPRESSION:  Samuel [name] presents to clinic for follow-up. No acute distress noted. He has a history of chronic cancer related pain managed with Oxycodone, presents with concerns about a recent increase in his medication copay due to insurance issues. He reports that his insurance now only covers a five-day supply of Oxycodone, resulting in an increase in out-of-pocket expenses. The patient has been taking his medication regularly due to increased activity related to moving, which has exacerbated his pain. Requesting completion of prior authorization due to new insurance year. Patient is aware provider and team will look into this matter.    Samuel [name] reports overall his pain is well managed on his regimen. He recently moved into a new home.  Does not require oxycodone daily. Confirms that he is taking gabapentin daily. His neuropathic pain is much improved. Occasional use of Tylenol for mild pain.   He reports he has not required use of his Tizanidine, a muscle relaxer. Despite the increased pain due to moving, the patient denies any new symptoms such as constipation, nausea, or vomiting. He also reports a decrease in energy levels, which he attributes to the increased physical activity related to moving. His appetite has been good. Weight increasing and is now up to 153lbs.  PLAN:  Chronic Pain Management Patient is on Oxycodone for pain management. There is an issue with insurance coverage for the medication, leading to an increased copay. The patient is currently taking the medication as needed. Some increase in pain over the past week due to increased activity from moving into new home  resulting in increased activity. -Work on obtaining prior authorization from insurance for Oxycodone. -Continue Oxycodone as prescribed.  Muscle Relaxant Use Patient has  been prescribed Tizanidine as a muscle relaxant but has not required. -Continue Tizanidine as needed.  Sleep Disturbance Patient reports some issues with sleep, potentially related to increased activity and pain. -Monitor and manage as needed.  Health Maintenance -Patient will follow back up in the clinic on November 06, 2023. Sooner if needed.   Patient expressed understanding and was in agreement with this plan. He also understands that He can call the clinic at any time with any questions, concerns, or complaints.       Any controlled substances utilized were prescribed in the context of palliative care. PDMP has been reviewed.    Visit consisted of counseling and education dealing with the complex and emotionally intense issues of symptom management and palliative care in the setting of serious and potentially life-threatening illness.  Willette Alma, AGPCNP-BC  Palliative Medicine Team/[facility]  *Please note that this is a verbal dictation therefore any spelling or grammatical errors are due to the "Dragon Medical One" system interpretation.

## 2023-11-06 ENCOUNTER — Other Ambulatory Visit (HOSPITAL_COMMUNITY): Payer: Self-pay

## 2023-11-06 ENCOUNTER — Inpatient Hospital Stay (HOSPITAL_BASED_OUTPATIENT_CLINIC_OR_DEPARTMENT_OTHER): Payer: Medicaid Other | Admitting: Nurse Practitioner

## 2023-11-06 ENCOUNTER — Encounter: Payer: Self-pay | Admitting: Nurse Practitioner

## 2023-11-06 ENCOUNTER — Inpatient Hospital Stay: Payer: Medicaid Other | Attending: Adult Health

## 2023-11-06 ENCOUNTER — Inpatient Hospital Stay: Payer: Medicaid Other

## 2023-11-06 ENCOUNTER — Inpatient Hospital Stay (HOSPITAL_BASED_OUTPATIENT_CLINIC_OR_DEPARTMENT_OTHER): Payer: Medicaid Other | Admitting: Internal Medicine

## 2023-11-06 VITALS — BP 125/70 | HR 85 | Temp 98.5°F | Resp 17 | Ht 68.0 in | Wt 160.5 lb

## 2023-11-06 DIAGNOSIS — R600 Localized edema: Secondary | ICD-10-CM | POA: Insufficient documentation

## 2023-11-06 DIAGNOSIS — C7931 Secondary malignant neoplasm of brain: Secondary | ICD-10-CM | POA: Diagnosis not present

## 2023-11-06 DIAGNOSIS — F1020 Alcohol dependence, uncomplicated: Secondary | ICD-10-CM | POA: Insufficient documentation

## 2023-11-06 DIAGNOSIS — C3490 Malignant neoplasm of unspecified part of unspecified bronchus or lung: Secondary | ICD-10-CM | POA: Diagnosis not present

## 2023-11-06 DIAGNOSIS — G893 Neoplasm related pain (acute) (chronic): Secondary | ICD-10-CM

## 2023-11-06 DIAGNOSIS — Z79899 Other long term (current) drug therapy: Secondary | ICD-10-CM | POA: Insufficient documentation

## 2023-11-06 DIAGNOSIS — Z515 Encounter for palliative care: Secondary | ICD-10-CM

## 2023-11-06 DIAGNOSIS — C3491 Malignant neoplasm of unspecified part of right bronchus or lung: Secondary | ICD-10-CM

## 2023-11-06 DIAGNOSIS — E119 Type 2 diabetes mellitus without complications: Secondary | ICD-10-CM | POA: Insufficient documentation

## 2023-11-06 DIAGNOSIS — I700 Atherosclerosis of aorta: Secondary | ICD-10-CM | POA: Diagnosis not present

## 2023-11-06 DIAGNOSIS — C7951 Secondary malignant neoplasm of bone: Secondary | ICD-10-CM | POA: Insufficient documentation

## 2023-11-06 DIAGNOSIS — Z7984 Long term (current) use of oral hypoglycemic drugs: Secondary | ICD-10-CM | POA: Diagnosis not present

## 2023-11-06 DIAGNOSIS — K760 Fatty (change of) liver, not elsewhere classified: Secondary | ICD-10-CM | POA: Insufficient documentation

## 2023-11-06 DIAGNOSIS — Z5111 Encounter for antineoplastic chemotherapy: Secondary | ICD-10-CM | POA: Insufficient documentation

## 2023-11-06 LAB — CBC WITH DIFFERENTIAL (CANCER CENTER ONLY)
Abs Immature Granulocytes: 0.01 10*3/uL (ref 0.00–0.07)
Basophils Absolute: 0 10*3/uL (ref 0.0–0.1)
Basophils Relative: 0 %
Eosinophils Absolute: 0.1 10*3/uL (ref 0.0–0.5)
Eosinophils Relative: 1 %
HCT: 37.3 % — ABNORMAL LOW (ref 39.0–52.0)
Hemoglobin: 12.4 g/dL — ABNORMAL LOW (ref 13.0–17.0)
Immature Granulocytes: 0 %
Lymphocytes Relative: 11 %
Lymphs Abs: 0.8 10*3/uL (ref 0.7–4.0)
MCH: 33.4 pg (ref 26.0–34.0)
MCHC: 33.2 g/dL (ref 30.0–36.0)
MCV: 100.5 fL — ABNORMAL HIGH (ref 80.0–100.0)
Monocytes Absolute: 0.7 10*3/uL (ref 0.1–1.0)
Monocytes Relative: 9 %
Neutro Abs: 6 10*3/uL (ref 1.7–7.7)
Neutrophils Relative %: 79 %
Platelet Count: 199 10*3/uL (ref 150–400)
RBC: 3.71 MIL/uL — ABNORMAL LOW (ref 4.22–5.81)
RDW: 12.7 % (ref 11.5–15.5)
WBC Count: 7.6 10*3/uL (ref 4.0–10.5)
nRBC: 0 % (ref 0.0–0.2)

## 2023-11-06 LAB — CMP (CANCER CENTER ONLY)
ALT: 25 U/L (ref 0–44)
AST: 20 U/L (ref 15–41)
Albumin: 3.5 g/dL (ref 3.5–5.0)
Alkaline Phosphatase: 58 U/L (ref 38–126)
Anion gap: 6 (ref 5–15)
BUN: 17 mg/dL (ref 6–20)
CO2: 25 mmol/L (ref 22–32)
Calcium: 8.5 mg/dL — ABNORMAL LOW (ref 8.9–10.3)
Chloride: 105 mmol/L (ref 98–111)
Creatinine: 0.55 mg/dL — ABNORMAL LOW (ref 0.61–1.24)
GFR, Estimated: >60 mL/min (ref >60)
Glucose, Bld: 247 mg/dL — ABNORMAL HIGH (ref 70–99)
Potassium: 3.7 mmol/L (ref 3.5–5.1)
Sodium: 136 mmol/L (ref 135–145)
Total Bilirubin: 0.3 mg/dL (ref <1.2)
Total Protein: 6.2 g/dL — ABNORMAL LOW (ref 6.5–8.1)

## 2023-11-06 LAB — TSH: TSH: 1.045 u[IU]/mL (ref 0.350–4.500)

## 2023-11-06 MED ORDER — SODIUM CHLORIDE 0.9 % IV SOLN: Freq: Once | INTRAVENOUS | Status: AC

## 2023-11-06 MED ORDER — SODIUM CHLORIDE 0.9% FLUSH
10.0000 mL | Freq: Once | INTRAVENOUS | Status: AC
  Administered 2023-11-06: 10 mL

## 2023-11-06 MED ORDER — SODIUM CHLORIDE 0.9% FLUSH
10.0000 mL | INTRAVENOUS | Status: DC | PRN
  Administered 2023-11-06: 10 mL

## 2023-11-06 MED ORDER — SODIUM CHLORIDE 0.9 % IV SOLN
200.0000 mg | Freq: Once | INTRAVENOUS | Status: AC
  Administered 2023-11-06: 200 mg via INTRAVENOUS
  Filled 2023-11-06: qty 200

## 2023-11-06 MED ORDER — CYANOCOBALAMIN 1000 MCG/ML IJ SOLN
1000.0000 ug | Freq: Once | INTRAMUSCULAR | Status: AC
Start: 2023-11-06 — End: 2023-11-06
  Administered 2023-11-06: 1000 ug via INTRAMUSCULAR
  Filled 2023-11-06: qty 1

## 2023-11-06 MED ORDER — SODIUM CHLORIDE 0.9 % IV SOLN
500.0000 mg/m2 | Freq: Once | INTRAVENOUS | Status: AC
  Administered 2023-11-06: 900 mg via INTRAVENOUS
  Filled 2023-11-06: qty 20

## 2023-11-06 MED ORDER — PROCHLORPERAZINE MALEATE 10 MG PO TABS
10.0000 mg | ORAL_TABLET | Freq: Once | ORAL | Status: AC
  Administered 2023-11-06: 10 mg via ORAL
  Filled 2023-11-06: qty 1

## 2023-11-06 MED ORDER — HEPARIN SOD (PORK) LOCK FLUSH 100 UNIT/ML IV SOLN
500.0000 [IU] | Freq: Once | INTRAVENOUS | Status: AC | PRN
  Administered 2023-11-06: 500 [IU]

## 2023-11-06 NOTE — Patient Instructions (Signed)
 [facility] - A DEPT OF [facility]  Discharge Instructions: Thank you for choosing [facility] to provide your oncology and hematology care.   If you have a lab appointment with the Cancer Center, please go directly to the Cancer Center and check in at the registration area.   Wear comfortable clothing and clothing appropriate for easy access to any Portacath or PICC line.   We strive to give you quality time with your provider. You may need to reschedule your appointment if you arrive late (15 or more minutes).  Arriving late affects you and other patients whose appointments are after yours.  Also, if you miss three or more appointments without notifying the office, you may be dismissed from the clinic at the provider's discretion.      For prescription refill requests, have your pharmacy contact our office and allow 72 hours for refills to be completed.    Today you received the following chemotherapy and/or immunotherapy agents: Keytruda, Alimta      To help prevent nausea and vomiting after your treatment, we encourage you to take your nausea medication as directed.  BELOW ARE SYMPTOMS THAT SHOULD BE REPORTED IMMEDIATELY: *FEVER GREATER THAN 100.4 F (38 C) OR HIGHER *CHILLS OR SWEATING *NAUSEA AND VOMITING THAT IS NOT CONTROLLED WITH YOUR NAUSEA MEDICATION *UNUSUAL SHORTNESS OF BREATH *UNUSUAL BRUISING OR BLEEDING *URINARY PROBLEMS (pain or burning when urinating, or frequent urination) *BOWEL PROBLEMS (unusual diarrhea, constipation, pain near the anus) TENDERNESS IN MOUTH AND THROAT WITH OR WITHOUT PRESENCE OF ULCERS (sore throat, sores in mouth, or a toothache) UNUSUAL RASH, SWELLING OR PAIN  UNUSUAL VAGINAL DISCHARGE OR ITCHING   Items with * indicate a potential emergency and should be followed up as soon as possible or go to the Emergency Department if any problems should occur.  Please show the CHEMOTHERAPY ALERT CARD or  IMMUNOTHERAPY ALERT CARD at check-in to the Emergency Department and triage nurse.  Should you have questions after your visit or need to cancel or reschedule your appointment, please contact [facility] - A DEPT OF [facility]  Dept: [phone]  and follow the prompts.  Office hours are 8:00 a.m. to 4:30 p.m. Monday - Friday. Please note that voicemails left after 4:00 p.m. may not be returned until the following business day.  We are closed weekends and major holidays. You have access to a nurse at all times for urgent questions. Please call the main number to the clinic Dept: [phone] and follow the prompts.   For any non-urgent questions, you may also contact your provider using MyChart. We now offer e-Visits for anyone 79 and older to request care online for non-urgent symptoms. For details visit [email or web address].   Also download the MyChart app! Go to the app store, search "MyChart", open the app, select [facility], and log in with your MyChart username and password.

## 2023-11-06 NOTE — Progress Notes (Signed)
[facility] [phone]   Fax:([phone]  OFFICE PROGRESS NOTE  Hoy Register, MD [address] 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  Biomarker Findings Microsatellite status - MS-Stable Tumor Mutational Burden - 8 Muts/Mb Genomic Findings For a complete list of the genes assayed, please refer to the Appendix. KEAP1 Q227* CDKN2A/B p16INK4a G89V PRKCI amplification TERC amplification - equivocal? TP53 D226fs*18 8 Disease relevant genes with no reportable alterations: ALK, BRAF, EGFR, ERBB2, KRAS, MET, RET, ROS1  PDL1 TPS  90%  PRIOR THERAPY:  1) Status post palliative radiotherapy to the L2 lesion under the care of Dr. Kathrynn Running. 2) Status post SRS to solitary brain metastasis under the care of Dr. Kathrynn Running  CURRENT THERAPY: Palliative systemic therapy with carboplatin for AUC of 5, Alimta 500 Mg/M2 and Keytruda 200 Mg IV every 3 weeks.  First dose September 05, 2022.  Status post 20 cycles.  Starting from cycle #5 the patient will be on maintenance treatment with Alimta and Keytruda every 3 weeks.  INTERVAL HISTORY: LEV CIOLEK II 57 y.o. male returns to the clinic today for follow-up visit.  Discussed the use of AI scribe software for clinical note transcription with the patient, who gave verbal consent to proceed.  History of Present Illness   The patient, Jamile Depetris, a 57 year old with a history of stage 4 adenocarcinoma diagnosed in September 2023, has been on a regimen of Carboplatin, Alimta, and Keytruda for four cycles, followed by maintenance Alimta and  Keytruda every three weeks. He has completed a total of twenty cycles so far.  Recently, the patient has been experiencing increased pressure in the chest, which was previously noted on a CT chest to be potentially related to radiation effects. The patient has been managing this discomfort with Tums, but the frequency of the symptom has been increasing.  The patient denies any recent weight loss, night sweats, nausea, vomiting, diarrhea, constipation, or headaches. He has gained six pounds since the last visit and reports a good appetite and regular gym activity.  In addition to his physical health, the patient has a history of major depression and anxiety. He has expressed interest in seeking therapy or psychiatric care to manage these conditions.  The patient has been seeing a palliative care team, and there is no mention of any new complications or adverse effects from the ongoing cancer treatment.        MEDICAL HISTORY: Past Medical History:  Diagnosis Date   Alcohol use disorder, severe, dependence (HCC) 12/26/2016   Diabetes mellitus without complication (HCC)    Metastatic cancer to brain (HCC) 08/05/2022   Metastatic cancer to spine (HCC) 07/19/2022   Pancreatitis unk   Primary lung adenocarcinoma (HCC) 08/05/2022   Suicidal ideation    Type 2 diabetes mellitus with hyperglycemia, without long-term current use of insulin (HCC) 06/13/2022   Formatting of this note might be different from the original. 06/13/2022 A1C 13.8, FSBG 414 Start empagliflozin 5mg /metformin 1000 daily, levemir 20 qhs. (Samples given) Will apply for medassist for pharmacy    ALLERGIES:  is allergic to penicillins and percocet [oxycodone-acetaminophen].  MEDICATIONS:  Current Outpatient Medications  Medication Sig Dispense Refill   Accu-Chek Softclix Lancets lancets Use to check blood sugar 3 times daily. E11.69 100 each 6   acetaminophen (TYLENOL) 500 MG tablet Take 1,000 mg by mouth in the morning and  at bedtime.     atorvastatin (LIPITOR) 40 MG tablet Take 1 tablet (40 mg total) by mouth daily. 90 tablet 1   buPROPion (WELLBUTRIN XL) 150 MG 24 hr tablet Take 1 tablet (150 mg total) by mouth daily. 90 tablet 1   empagliflozin (JARDIANCE) 25 MG TABS tablet Take 1 tablet (25 mg total) by mouth daily before breakfast. 90 tablet 1   folic acid (FOLVITE) 1 MG tablet Take 1 tablet (1 mg total) by mouth daily. 30 tablet 4   gabapentin (NEURONTIN) 300 MG capsule Take 2 capsules (600 mg total) by mouth 3 (three) times daily. 180 capsule 1   glucose blood (ACCU-CHEK GUIDE TEST) test strip Use as instructed twice daily 100 each 12   glucose blood (ACCU-CHEK GUIDE) test strip Use to check blood sugar 3 times daily. E11.69 100 each 6   insulin glargine (LANTUS SOLOSTAR) 100 UNIT/ML Solostar Pen Inject 50 Units into the skin daily. 15 mL 11   Insulin Pen Needle 32G X 4 MM MISC Use with Insulin pen 100 each 3   Oxycodone HCl 10 MG TABS Take 1 tablet (10 mg) by mouth every 6 hours as needed for severe pain. 60 tablet 0   tadalafil (CIALIS) 10 MG tablet Take 1 tablet (10 mg total) by mouth every other day as needed for erectile dysfunction. 10 tablet 2   tiZANidine (ZANAFLEX) 2 MG tablet Take 1 tablet (2 mg) by mouth at bedtime. 30 tablet 1   No current facility-administered medications for this visit.    SURGICAL HISTORY:  Past Surgical History:  Procedure Laterality Date   IR BONE TUMOR(S)RF ABLATION  08/01/2022   IR IMAGING GUIDED PORT INSERTION  09/21/2022   IR KYPHO LUMBAR INC FX REDUCE BONE BX UNI/BIL CANNULATION INC/IMAGING  08/01/2022    REVIEW OF SYSTEMS:  A comprehensive review of systems was negative except for: Constitutional: positive for fatigue Gastrointestinal: positive for dyspepsia   PHYSICAL EXAMINATION: General appearance: alert, cooperative, fatigued, and no distress Head: Normocephalic, without obvious abnormality, atraumatic Neck: no adenopathy, no JVD, supple, symmetrical,  trachea midline, and thyroid not enlarged, symmetric, no tenderness/mass/nodules Lymph nodes: Cervical, supraclavicular, and axillary nodes normal. Resp: clear to auscultation bilaterally Back: symmetric, no curvature. ROM normal. No CVA tenderness. Cardio: regular rate and rhythm, S1, S2 normal, no murmur, click, rub or gallop GI: soft, non-tender; bowel sounds normal; no masses,  no organomegaly Extremities: extremities normal, atraumatic, no cyanosis or edema  ECOG PERFORMANCE STATUS: 1 - Symptomatic but completely ambulatory  Blood pressure 125/70, pulse 85, temperature 98.5 F (36.9 C), temperature source Temporal, resp. rate 17, height 5\' 8"  (1.727 m), weight 160 lb 8 oz (72.8 kg), SpO2 100%.  LABORATORY DATA: Lab Results  Component Value Date   WBC 7.6 11/06/2023   HGB 12.4 (L) 11/06/2023   HCT 37.3 (L) 11/06/2023   MCV 100.5 (H) 11/06/2023   PLT 199 11/06/2023      Chemistry      Component Value Date/Time   NA 137 10/16/2023 1418   NA 140 12/24/2020 1144   NA 142 08/02/2014 1649   K 3.7 10/16/2023 1418   K 4.2 08/02/2014 1649   CL 104 10/16/2023 1418   CL 109 (H) 08/02/2014 1649   CO2 26 10/16/2023  1418   CO2 21 08/02/2014 1649   BUN 19 10/16/2023 1418   BUN 7 12/24/2020 1144   BUN 11 08/02/2014 1649   CREATININE 0.61 10/16/2023 1418   CREATININE 0.92 08/02/2014 1649      Component Value Date/Time   CALCIUM 9.5 10/16/2023 1418   CALCIUM 8.6 08/02/2014 1649   ALKPHOS 64 10/16/2023 1418   ALKPHOS 82 08/02/2014 1649   AST 31 10/16/2023 1418   ALT 45 (H) 10/16/2023 1418   ALT 132 (H) 08/02/2014 1649   BILITOT 0.3 10/16/2023 1418       RADIOGRAPHIC STUDIES: CT Chest W Contrast Result Date: 10/16/2023 CLINICAL DATA:  Assess treatment response primary adenocarcinoma of the lung. * Tracking Code: BO * EXAM: CT CHEST WITH CONTRAST TECHNIQUE: Multidetector CT imaging of the chest was performed during intravenous contrast administration. RADIATION DOSE REDUCTION:  This exam was performed according to the departmental dose-optimization program which includes automated exposure control, adjustment of the mA and/or kV according to patient size and/or use of iterative reconstruction technique. CONTRAST:  75mL OMNIPAQUE IOHEXOL 300 MG/ML  SOLN COMPARISON:  CT 07/31/2023 and older. FINDINGS: Cardiovascular: Right upper chest port is accessed. Tip along the right atrium. Trace pericardial fluid. Heart itself is nonenlarged. Bovine type aortic arch, normal variant. Scattered partially calcified atherosclerotic plaque along the aorta and branch vessels. Mediastinum/Nodes: Patulous esophagus with some luminal fluid. Preserved thyroid gland. No specific abnormal lymph node enlargement seen in the axillary regions. No defined abnormal nodes in the mediastinum but there is some mediastinal soft tissue edema. Small areas of nodes in the hilum bilaterally as well are nonpathologic by size criteria. Unchanged from previous. Mild soft tissue thickening as well in the hilum. Lungs/Pleura: No consolidation, pneumothorax or effusion. Scattered paraseptal and centrilobular emphysematous lung changes. Apical pleural thickening and scarring. No developing dominant lung mass. Upper Abdomen: Stable nodular thickening of the right adrenal gland. Fatty liver infiltration. Musculoskeletal: Slight curvature of the spine with some degenerative changes. Old bilateral rib fractures are identified. Old right clavicle fracture. Stable areas of sclerosis involving the T3 vertebral level. Other areas of lucency and sclerosis. IMPRESSION: No significant interval change. Stable areas of bony sclerosis. No developing mass lesion, fluid collection or lymph node enlargement. Stable edema in the mediastinum and thickening as well as some soft tissue thickening along the hilum. Patulous fluid-filled esophagus. Please correlate with symptomatology. Aortic Atherosclerosis (ICD10-I70.0) and Emphysema (ICD10-J43.9).  Electronically Signed   By: Karen Kays M.D.   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 He has molecular studies by foundation 1 that showed no actionable mutations and PD-L1 expression was 90%. He underwent palliative radiotherapy to the L2 vertebral body compression fraction as well as solitary brain metastasis with SRS. He is currently undergoing palliative systemic chemotherapy with carboplatin for AUC of 5, Alimta 500 Mg/M2 and Keytruda 200 Mg IV every 3 weeks.  Status post 20 cycles. Starting from cycle #5 he is on treatment with maintenance Alimta and Keytruda every 3 weeks. The patient has been tolerating this treatment fairly well with no concerning adverse effects.  Adenocarcinoma of the Lung Diagnosed in September 2023. Currently on maintenance therapy with Alimta and Keytruda every three weeks. No recent complications. Reports good appetite and weight gain. No recent weight loss, night sweats, nausea, vomiting, diarrhea, constipation, or headaches. Has received twenty cycles and is tolerating treatment well. - Continue Alimta and Keytruda every three weeks - Follow-up in three weeks  Chest Pressure Intermittent chest pressure, possibly related to radiation effects. Reports increased frequency of symptoms. Discussed potential benefit of Prilosec and referral to a gastroenterologist if symptoms persist. - Start Prilosec OTC, one daily - Refer to gastroenterologist if symptoms persist  Major Depression and Anxiety Major depression and anxiety.  Inquired about therapy options. Discussed referral to a therapist or psychiatrist through primary care and additional support from palliative care team. - Discuss referral with primary care physician - Inform palliative care team for additional support  General Health Maintenance Reports good overall health and increased physical activity. No recent significant health issues. Encouraged continued healthy eating and regular exercise. - Encourage healthy eating and regular exercise.  For the pain management, he is currently on MS Contin 15 mg p.o. twice daily in addition to Oxy IR for breakthrough pain and managed by the palliative care team.  He was advised to call immediately if he has any concerning symptoms in the interval. The patient voices understanding of current disease status and treatment options and is in agreement with the current care plan.  All questions were answered. The patient knows to call the clinic with any problems, questions or concerns. We can certainly see the patient much sooner if necessary.  The total time spent in the appointment was 20 minutes.  Disclaimer: This note was dictated with voice recognition software. Similar sounding words can inadvertently be transcribed and may not be corrected upon review.

## 2023-11-07 LAB — T4: T4, Total: 8.9 ug/dL (ref 4.5–12.0)

## 2023-11-08 ENCOUNTER — Other Ambulatory Visit: Payer: Medicaid Other | Admitting: Licensed Clinical Social Worker

## 2023-11-21 ENCOUNTER — Other Ambulatory Visit (HOSPITAL_COMMUNITY): Payer: Self-pay

## 2023-11-21 ENCOUNTER — Other Ambulatory Visit: Payer: Self-pay | Admitting: Nurse Practitioner

## 2023-11-21 DIAGNOSIS — G893 Neoplasm related pain (acute) (chronic): Secondary | ICD-10-CM

## 2023-11-21 DIAGNOSIS — C3490 Malignant neoplasm of unspecified part of unspecified bronchus or lung: Secondary | ICD-10-CM

## 2023-11-21 DIAGNOSIS — C7951 Secondary malignant neoplasm of bone: Secondary | ICD-10-CM

## 2023-11-21 DIAGNOSIS — Z515 Encounter for palliative care: Secondary | ICD-10-CM

## 2023-11-21 DIAGNOSIS — M792 Neuralgia and neuritis, unspecified: Secondary | ICD-10-CM

## 2023-11-21 MED ORDER — OXYCODONE HCL 10 MG PO TABS
10.0000 mg | ORAL_TABLET | Freq: Four times a day (QID) | ORAL | 0 refills | Status: DC | PRN
  Filled 2023-11-21: qty 60, 15d supply, fill #0

## 2023-11-22 NOTE — Progress Notes (Signed)
 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    Biomarker Findings Microsatellite status - MS-Stable Tumor Mutational Burden - 8 Muts/Mb Genomic Findings For a complete list of the genes assayed, please refer to the Appendix. KEAP1 Q227* CDKN2A/B [id number] G89V PRKCI amplification TERC amplification - equivocal? TP53 D215fs*18 8 Disease relevant genes with no reportable alterations: ALK, BRAF, EGFR, ERBB2, KRAS, MET, RET, ROS1   PDL1 TPS  90%  PRIOR THERAPY: 1) Status post palliative radiotherapy to the L2 lesion under the care of Dr. Patrcia. 2) Status post SRS to solitary brain metastasis under the care of Dr. Patrcia  CURRENT THERAPY: Palliative systemic therapy with carboplatin  for AUC of 5, Alimta  500 Mg/M2 and Keytruda  200 Mg IV every 3 weeks. First dose September 05, 2022. Status post 21 cycles. Starting from cycle #5, he started on maintenance keytruda  and alimta  IV every 3 weeks.   INTERVAL HISTORY: Samuel [name] 58 y.o. male returns to the clinic today for a follow-up visit. He states he feels well today.  He is trying to be more active and goes to the gym and eat healthier.  He did mention that he has been having some more fatigue.  Otherwise he tolerated his last appointment/infusion well except he had some stomach upset possibly due to something he ate the day before treatment.  Otherwise he denies any other stomach upset besides on the day of his last infusion.  He did recently start taking Prilosec for chest pressure  which has helped and he has not had any more episodes of chest pressure.    He denies fevers.  He has a good appetite. Denies nausea or vomiting. He denies diarrhea or constipation. Denies any shortness of breath. He denies cough. He denies hemoptysis. Denies any headache or visual changes. He denies any new rashes.  He had some questions about the duration of treatment.  He is here for evaluation and before undergoing cycle #22   MEDICAL HISTORY: Past Medical History:  Diagnosis Date   Alcohol  use disorder, severe, dependence (HCC) 12/26/2016   Diabetes mellitus without complication (HCC)    Metastatic cancer to brain (HCC) 08/05/2022   Metastatic cancer to spine (HCC) 07/19/2022   Pancreatitis unk   Primary lung adenocarcinoma (HCC) 08/05/2022   Suicidal ideation    Type 2 diabetes mellitus with hyperglycemia, without long-term current use of insulin  (HCC) 06/13/2022   Formatting of this note might be different from the original. 06/13/2022 A1C 13.8, FSBG 414 Start empagliflozin  5mg /metformin  1000 daily, levemir 20 qhs. (Samples given) Will apply for medassist for pharmacy    ALLERGIES:  is allergic to penicillins and percocet [oxycodone -acetaminophen ].  MEDICATIONS:  Current Outpatient Medications  Medication Sig Dispense Refill   Accu-Chek Softclix Lancets lancets Use to check blood sugar 3 times daily. E11.69 100 each 6   acetaminophen  (TYLENOL ) 500 MG tablet Take 1,000 mg by mouth in the morning and at bedtime.     atorvastatin  (LIPITOR) 40 MG tablet Take 1 tablet (40 mg total) by mouth daily. 90 tablet 1   buPROPion  (WELLBUTRIN  XL) 150 MG 24  hr tablet Take 1 tablet (150 mg total) by mouth daily. 90 tablet 1   empagliflozin  (JARDIANCE ) 25 MG TABS tablet Take 1 tablet (25 mg total) by mouth daily before breakfast. 90 tablet 1   folic acid  (FOLVITE ) 1 MG tablet Take 1 tablet (1 mg total) by mouth daily. 30 tablet 4   gabapentin  (NEURONTIN ) 300 MG capsule Take 2 capsules (600 mg total)  by mouth 3 (three) times daily. 180 capsule 1   glucose blood (ACCU-CHEK GUIDE TEST) test strip Use as instructed twice daily 100 each 12   glucose blood (ACCU-CHEK GUIDE) test strip Use to check blood sugar 3 times daily. E11.69 100 each 6   insulin  glargine (LANTUS  SOLOSTAR) 100 UNIT/ML Solostar Pen Inject 50 Units into the skin daily. 15 mL 11   Insulin  Pen Needle 32G X 4 MM MISC Use with Insulin  pen 100 each 3   Oxycodone  HCl 10 MG TABS Take 1 tablet (10 mg) by mouth every 6 hours as needed for severe pain. 60 tablet 0   tadalafil  (CIALIS ) 10 MG tablet Take 1 tablet (10 mg total) by mouth every other day as needed for erectile dysfunction. 10 tablet 2   No current facility-administered medications for this visit.    SURGICAL HISTORY:  Past Surgical History:  Procedure Laterality Date   IR BONE TUMOR(S)RF ABLATION  08/01/2022   IR IMAGING GUIDED PORT INSERTION  09/21/2022   IR KYPHO LUMBAR INC FX REDUCE BONE BX UNI/BIL CANNULATION INC/IMAGING  08/01/2022    REVIEW OF SYSTEMS:   Constitutional: Positive for mild fatigue. Negative for appetite change, chills,  fever and unexpected weight change.  HENT:  Negative for mouth sores, nosebleeds, sore throat and trouble swallowing.   Eyes: Negative for eye problems and icterus.  Respiratory: Negative for cough, hemoptysis, shortness of breath and wheezing.   Cardiovascular: Negative for chest pain and leg swelling.  Gastrointestinal: Negative for abdominal pain, constipation, diarrhea, nausea and vomiting.  Genitourinary: Negative for bladder incontinence, difficulty urinating, dysuria, frequency and hematuria.   Musculoskeletal:  Positive for chronic back pain. Negative for gait problem, neck pain and neck stiffness.  Skin: Negative for itching and rash.  Neurological: Negative for dizziness, extremity weakness, gait problem, headaches, light-headedness and seizures.  Hematological: Negative for adenopathy. Does not bruise/bleed easily.   Psychiatric/Behavioral: Negative for confusion, depression and sleep disturbance. The patient is not nervous/anxious.    PHYSICAL EXAMINATION:  Blood pressure 114/68, pulse 79, temperature 98.3 F (36.8 C), temperature source Temporal, resp. rate 17, height 5' 8 (1.727 m), weight 157 lb 14.4 oz (71.6 kg), SpO2 100%.  ECOG PERFORMANCE STATUS: 1  Physical Exam  Constitutional: Oriented to person, place, and time and well-developed, well-nourished, and in no distress.  HENT:  Head: Normocephalic and atraumatic.  Mouth/Throat: Oropharynx is clear and moist. No oropharyngeal exudate.  Eyes: Conjunctivae are normal. Right eye exhibits no discharge. Left eye exhibits no discharge. No scleral icterus.  Neck: Normal range of motion. Neck supple.  Cardiovascular: Normal rate, regular rhythm, normal heart sounds and intact distal pulses.   Pulmonary/Chest: Effort normal and breath sounds normal. No respiratory distress. No wheezes. No rales.  Abdominal: Soft. Bowel sounds are normal. Exhibits no distension and no mass. There is no tenderness.  Musculoskeletal: Normal range of motion. Exhibits no edema.  Lymphadenopathy:    No cervical adenopathy.  Neurological: Alert and oriented to person, place, and time. Exhibits normal muscle tone. Gait normal. Coordination normal.  Skin: Skin is warm and dry.  No rash noted. Not diaphoretic. No erythema. No pallor.  Psychiatric: Mood, memory and judgment normal.  Vitals reviewed.  LABORATORY DATA: Lab Results  Component Value Date   WBC 7.6 11/27/2023   HGB 12.9 (L) 11/27/2023   HCT 37.5 (L) 11/27/2023   MCV 97.9 11/27/2023   PLT 233 11/27/2023      Chemistry      Component Value Date/Time   NA 140 11/27/2023 0804   NA 140 12/24/2020 1144   NA 142 08/02/2014 1649   K 3.6 11/27/2023 0804   K 4.2 08/02/2014 1649   CL 110 11/27/2023 0804   CL 109 (H) 08/02/2014 1649   CO2 25 11/27/2023 0804   CO2 21 08/02/2014 1649   BUN 14 11/27/2023 0804    BUN 7 12/24/2020 1144   BUN 11 08/02/2014 1649   CREATININE 0.54 (L) 11/27/2023 0804   CREATININE 0.92 08/02/2014 1649      Component Value Date/Time   CALCIUM  8.7 (L) 11/27/2023 0804   CALCIUM  8.6 08/02/2014 1649   ALKPHOS 59 11/27/2023 0804   ALKPHOS 82 08/02/2014 1649   AST 29 11/27/2023 0804   ALT 36 11/27/2023 0804   ALT 132 (H) 08/02/2014 1649   BILITOT 0.4 11/27/2023 0804       RADIOGRAPHIC STUDIES:  No results found.   ASSESSMENT/PLAN:  This is a very pleasant 58 year old African-American male diagnosed with stage IV (T3, N2, M1 C) non-small cell lung cancer, adenocarcinoma.  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  He underwent biopsy and kyphoplasty to the L2 and to the brain metastasis.  He was diagnosed in September 2023.   His molecular studies showed no actionable mutations and his PD-L1 expression is 90%.  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    He underwent palliative radiation to L2 compression fracture as well as a solitary brain metastasis with SRS.    He is currently being treated with palliative systemic chemotherapy with carboplatin  for an AUC of 5, Alimta  500 mg per metered square, Keytruda  200 mg IV every 3 weeks.  He status post 21 cycles and he tolerated well without any concerning adverse side effects.  Starting from cycle #5 he started maintenance Alimta  and Keytruda .  IV every 3 weeks  Radiation oncology is monitoring his brain MRI and lumbar spine closely every few months. He recently had a brain MRI and follow up with radiation oncology   Labs were reviewed.  Recommend that he proceed with cycle #22 today as scheduled.   We will see him back for follow-up visit  in 3 weeks for evaluation repeat blood work before undergoing cycle #23.   We will talk about ordering a repeat CT scan after his next infusion.  He will continue taking Prilosec.  The patient and I talked about duration of treatment.  We will arrange for scans every 3-4 cycles of treatment.  Once he hits cycle #35 as long as his scan at that time does not show any evidence of disease progression we may talk about discontinuing his immunotherapy with Keytruda .   The patient was advised to call immediately if he has any concerning symptoms in the interval. The patient voices understanding of current disease status and treatment options and is in  agreement with the current care plan. All questions were answered. The patient knows to call the clinic with any problems, questions or concerns. We can certainly see the patient much sooner if necessary      No orders of the defined types were placed in this encounter.    The total time spent in the appointment was 20-29 minutes  Yvonda Fouty L Makhai Fulco, PA-C 11/27/23

## 2023-11-23 ENCOUNTER — Other Ambulatory Visit: Payer: Self-pay | Admitting: Radiation Therapy

## 2023-11-23 ENCOUNTER — Telehealth: Payer: Self-pay | Admitting: Radiation Therapy

## 2023-11-23 NOTE — Telephone Encounter (Signed)
 I spoke with Mr. Samuel [name] about the brain MRI and telephone follow-up scheduled later this month. He has these down and plans to attend.   Jalene Mullet R.T.(R)(T) Radiation Special Procedures Navigator

## 2023-11-24 ENCOUNTER — Other Ambulatory Visit: Payer: Self-pay | Admitting: Critical Care Medicine

## 2023-11-24 ENCOUNTER — Other Ambulatory Visit (HOSPITAL_COMMUNITY): Payer: Self-pay

## 2023-11-24 DIAGNOSIS — C7951 Secondary malignant neoplasm of bone: Secondary | ICD-10-CM

## 2023-11-24 DIAGNOSIS — M792 Neuralgia and neuritis, unspecified: Secondary | ICD-10-CM

## 2023-11-24 DIAGNOSIS — Z515 Encounter for palliative care: Secondary | ICD-10-CM

## 2023-11-24 DIAGNOSIS — G893 Neoplasm related pain (acute) (chronic): Secondary | ICD-10-CM

## 2023-11-24 DIAGNOSIS — C3490 Malignant neoplasm of unspecified part of unspecified bronchus or lung: Secondary | ICD-10-CM

## 2023-11-27 ENCOUNTER — Other Ambulatory Visit: Payer: Self-pay

## 2023-11-27 ENCOUNTER — Other Ambulatory Visit (HOSPITAL_COMMUNITY): Payer: Self-pay

## 2023-11-27 ENCOUNTER — Encounter: Payer: Self-pay | Admitting: Internal Medicine

## 2023-11-27 ENCOUNTER — Inpatient Hospital Stay: Payer: Medicaid Other

## 2023-11-27 ENCOUNTER — Inpatient Hospital Stay (HOSPITAL_BASED_OUTPATIENT_CLINIC_OR_DEPARTMENT_OTHER): Payer: Medicaid Other | Admitting: Nurse Practitioner

## 2023-11-27 ENCOUNTER — Inpatient Hospital Stay (HOSPITAL_BASED_OUTPATIENT_CLINIC_OR_DEPARTMENT_OTHER): Payer: Medicaid Other | Attending: Adult Health | Admitting: Physician Assistant

## 2023-11-27 ENCOUNTER — Inpatient Hospital Stay: Payer: Medicaid Other | Attending: Adult Health

## 2023-11-27 VITALS — BP 114/68 | HR 79 | Temp 98.3°F | Resp 17 | Ht 68.0 in | Wt 157.9 lb

## 2023-11-27 DIAGNOSIS — Z5112 Encounter for antineoplastic immunotherapy: Secondary | ICD-10-CM

## 2023-11-27 DIAGNOSIS — G893 Neoplasm related pain (acute) (chronic): Secondary | ICD-10-CM

## 2023-11-27 DIAGNOSIS — F17210 Nicotine dependence, cigarettes, uncomplicated: Secondary | ICD-10-CM | POA: Insufficient documentation

## 2023-11-27 DIAGNOSIS — Z79899 Other long term (current) drug therapy: Secondary | ICD-10-CM | POA: Insufficient documentation

## 2023-11-27 DIAGNOSIS — C3491 Malignant neoplasm of unspecified part of right bronchus or lung: Secondary | ICD-10-CM | POA: Insufficient documentation

## 2023-11-27 DIAGNOSIS — C7951 Secondary malignant neoplasm of bone: Secondary | ICD-10-CM | POA: Insufficient documentation

## 2023-11-27 DIAGNOSIS — E119 Type 2 diabetes mellitus without complications: Secondary | ICD-10-CM | POA: Diagnosis not present

## 2023-11-27 DIAGNOSIS — Z515 Encounter for palliative care: Secondary | ICD-10-CM | POA: Diagnosis not present

## 2023-11-27 DIAGNOSIS — K5903 Drug induced constipation: Secondary | ICD-10-CM

## 2023-11-27 DIAGNOSIS — C7931 Secondary malignant neoplasm of brain: Secondary | ICD-10-CM | POA: Insufficient documentation

## 2023-11-27 DIAGNOSIS — Z5111 Encounter for antineoplastic chemotherapy: Secondary | ICD-10-CM | POA: Diagnosis not present

## 2023-11-27 DIAGNOSIS — K5900 Constipation, unspecified: Secondary | ICD-10-CM | POA: Insufficient documentation

## 2023-11-27 DIAGNOSIS — C3490 Malignant neoplasm of unspecified part of unspecified bronchus or lung: Secondary | ICD-10-CM

## 2023-11-27 DIAGNOSIS — F1020 Alcohol dependence, uncomplicated: Secondary | ICD-10-CM | POA: Diagnosis not present

## 2023-11-27 DIAGNOSIS — Z7984 Long term (current) use of oral hypoglycemic drugs: Secondary | ICD-10-CM | POA: Insufficient documentation

## 2023-11-27 DIAGNOSIS — Z923 Personal history of irradiation: Secondary | ICD-10-CM | POA: Insufficient documentation

## 2023-11-27 LAB — CMP (CANCER CENTER ONLY)
ALT: 36 U/L (ref 0–44)
AST: 29 U/L (ref 15–41)
Albumin: 3.5 g/dL (ref 3.5–5.0)
Alkaline Phosphatase: 59 U/L (ref 38–126)
Anion gap: 5 (ref 5–15)
BUN: 14 mg/dL (ref 6–20)
CO2: 25 mmol/L (ref 22–32)
Calcium: 8.7 mg/dL — ABNORMAL LOW (ref 8.9–10.3)
Chloride: 110 mmol/L (ref 98–111)
Creatinine: 0.54 mg/dL — ABNORMAL LOW (ref 0.61–1.24)
GFR, Estimated: >60 mL/min (ref >60)
Glucose, Bld: 105 mg/dL — ABNORMAL HIGH (ref 70–99)
Potassium: 3.6 mmol/L (ref 3.5–5.1)
Sodium: 140 mmol/L (ref 135–145)
Total Bilirubin: 0.4 mg/dL (ref 0.0–1.2)
Total Protein: 6.7 g/dL (ref 6.5–8.1)

## 2023-11-27 LAB — CBC WITH DIFFERENTIAL (CANCER CENTER ONLY)
Abs Immature Granulocytes: 0.03 10*3/uL (ref 0.00–0.07)
Basophils Absolute: 0 10*3/uL (ref 0.0–0.1)
Basophils Relative: 0 %
Eosinophils Absolute: 0 10*3/uL (ref 0.0–0.5)
Eosinophils Relative: 0 %
HCT: 37.5 % — ABNORMAL LOW (ref 39.0–52.0)
Hemoglobin: 12.9 g/dL — ABNORMAL LOW (ref 13.0–17.0)
Immature Granulocytes: 0 %
Lymphocytes Relative: 9 %
Lymphs Abs: 0.7 10*3/uL (ref 0.7–4.0)
MCH: 33.7 pg (ref 26.0–34.0)
MCHC: 34.4 g/dL (ref 30.0–36.0)
MCV: 97.9 fL (ref 80.0–100.0)
Monocytes Absolute: 0.5 10*3/uL (ref 0.1–1.0)
Monocytes Relative: 6 %
Neutro Abs: 6.4 10*3/uL (ref 1.7–7.7)
Neutrophils Relative %: 85 %
Platelet Count: 233 10*3/uL (ref 150–400)
RBC: 3.83 MIL/uL — ABNORMAL LOW (ref 4.22–5.81)
RDW: 13.1 % (ref 11.5–15.5)
WBC Count: 7.6 10*3/uL (ref 4.0–10.5)
nRBC: 0 % (ref 0.0–0.2)

## 2023-11-27 MED ORDER — SODIUM CHLORIDE 0.9 % IV SOLN
200.0000 mg | Freq: Once | INTRAVENOUS | Status: AC
  Administered 2023-11-27: 200 mg via INTRAVENOUS
  Filled 2023-11-27: qty 200

## 2023-11-27 MED ORDER — PROCHLORPERAZINE MALEATE 10 MG PO TABS
10.0000 mg | ORAL_TABLET | Freq: Once | ORAL | Status: AC
  Administered 2023-11-27: 10 mg via ORAL
  Filled 2023-11-27: qty 1

## 2023-11-27 MED ORDER — SODIUM CHLORIDE 0.9% FLUSH
10.0000 mL | Freq: Once | INTRAVENOUS | Status: AC
Start: 2023-11-27 — End: 2023-11-27
  Administered 2023-11-27: 10 mL

## 2023-11-27 MED ORDER — SODIUM CHLORIDE 0.9 % IV SOLN
500.0000 mg/m2 | Freq: Once | INTRAVENOUS | Status: AC
  Administered 2023-11-27: 900 mg via INTRAVENOUS
  Filled 2023-11-27: qty 16

## 2023-11-27 MED ORDER — SODIUM CHLORIDE 0.9 % IV SOLN
Freq: Once | INTRAVENOUS | Status: AC
Start: 2023-11-27 — End: 2023-11-27

## 2023-11-27 NOTE — Progress Notes (Signed)
 Palliative Medicine [facility]  [phone] [phone]   Name: Samuel [name] Date: 11/27/2023 MRN: [id number]  DOB: August 07, 1966  Patient Care Team: [name], MD as PCP - General (Family Medicine) Pickenpack-Cousar, Fannie SAILOR, NP as Nurse Practitioner (Nurse Practitioner)   INTERVAL HISTORY: Samuel [name] is a 58 y.o. 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  Palliative ask to see for symptom management and goals of care.   SOCIAL HISTORY:     reports that he has been smoking cigarettes. He has never used smokeless tobacco. He reports current alcohol  use of about 50.0 standard drinks of alcohol  per week. He reports that he does not use drugs.  ADVANCE DIRECTIVES:  Patient does not have an advanced directives. Education and packet provided. He has expressed interest in completing. States his sister, Sydney Sasso would be his designated clinical research associate.   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 06/13/2022 A1C 13.8, FSBG 414 Start empagliflozin  5mg /metformin  1000 daily, levemir 20 qhs. (Samples given) Will apply for medassist for pharmacy    ALLERGIES:  is allergic to penicillins and percocet [oxycodone -acetaminophen ].  MEDICATIONS:  Current Outpatient Medications  Medication Sig Dispense Refill    Accu-Chek Softclix Lancets lancets Use to check blood sugar 3 times daily. E11.69 100 each 6   acetaminophen  (TYLENOL ) 500 MG tablet Take 1,000 mg by mouth in the morning and at bedtime.     atorvastatin  (LIPITOR) 40 MG tablet Take 1 tablet (40 mg total) by mouth daily. 90 tablet 1   buPROPion  (WELLBUTRIN  XL) 150 MG 24 hr tablet Take 1 tablet (150 mg total) by mouth daily. 90 tablet 1   empagliflozin  (JARDIANCE ) 25 MG TABS tablet Take 1 tablet (25 mg total) by mouth daily before breakfast. 90 tablet 1   folic acid  (FOLVITE ) 1 MG tablet Take 1 tablet (1 mg total) by mouth daily. 30 tablet 4   gabapentin  (NEURONTIN ) 300 MG capsule Take 2 capsules (600 mg total) by mouth 3 (three) times daily. 180 capsule 1   glucose blood (ACCU-CHEK GUIDE TEST) test strip Use as instructed twice daily 100 each 12   glucose blood (ACCU-CHEK GUIDE) test strip Use to check blood sugar 3 times daily. E11.69 100 each 6   insulin  glargine (LANTUS  SOLOSTAR) 100 UNIT/ML Solostar Pen Inject 50 Units into the skin daily. 15 mL 11   Insulin  Pen Needle 32G X 4 MM MISC Use with Insulin  pen 100 each 3   Oxycodone  HCl 10 MG TABS Take 1 tablet (10 mg) by mouth every 6 hours as needed for severe pain. 60 tablet 0   tadalafil  (CIALIS ) 10 MG tablet Take 1 tablet (10 mg total) by mouth every other day as needed for erectile dysfunction. 10 tablet 2   No current facility-administered medications for this visit.    VITAL SIGNS: There were no vitals taken for  this visit. There were no vitals filed for this visit.  Estimated body mass index is 24.4 kg/m as calculated from the following:   Height as of 11/06/23: 5' 8 (1.727 m).   Weight as of 11/06/23: 160 lb 8 oz (72.8 kg).   PERFORMANCE STATUS (ECOG) : 1 - Symptomatic but completely ambulatory   Physical Exam General: NAD Cardiovascular: regular rate and rhythm Pulmonary:normal breathing pattern  Extremities: no edema, no joint deformities, brace in place  Skin: no  rashes Neurological: AAO x4  Discussed the use of AI scribe software for clinical note transcription with the patient, who gave verbal consent to proceed.   IMPRESSION:  I saw Samuel [name] during his infusion. No acute distress. Reports overall good health. Enjoying new apartment.  The patient reports no issues with appetite and has been maintaining a healthy diet. Denies nausea, vomiting, or diarrhea. Constipation managed with daily stool softeners.    He has been managing his pain with a regimen of oxycodone , which he finds effective. Pain is well controlled. No changes to regimen.   Samuel [name] connected to Social Work for ongoing counseling support.   All questions answered and support provided.  PLAN:  Mental Health Patient reports feeling mentally well but expresses interest in speaking with a Counselor to help process his experiences and feelings. -Receiving support from CSW team.  Pain Management Pain controlled with current regimen of Oxycodone .  -Continue current regimen. Oxycodone  10 mg as needed every 6 hours.  -Tylenol  as needed  Sleep Reports good sleep quality without the need for medication.   Health Maintenance Follow-up in 4-6 weeks. Sooner if needed.  Patient expressed understanding and was in agreement with this plan. He also understands that He can call the clinic at any time with any questions, concerns, or complaints.       Any controlled substances utilized were prescribed in the context of palliative care. PDMP has been reviewed.    Visit consisted of counseling and education dealing with the complex and emotionally intense issues of symptom management and palliative care in the setting of serious and potentially life-threatening illness.  Levon Borer, AGPCNP-BC  Palliative Medicine Team/[facility]

## 2023-11-27 NOTE — Patient Instructions (Signed)
 [facility] - A DEPT OF [facility]  Discharge Instructions: Thank you for choosing [facility] to provide your oncology and hematology care.   If you have a lab appointment with the Cancer Center, please go directly to the Cancer Center and check in at the registration area.   Wear comfortable clothing and clothing appropriate for easy access to any Portacath or PICC line.   We strive to give you quality time with your provider. You may need to reschedule your appointment if you arrive late (15 or more minutes).  Arriving late affects you and other patients whose appointments are after yours.  Also, if you miss three or more appointments without notifying the office, you may be dismissed from the clinic at the provider's discretion.      For prescription refill requests, have your pharmacy contact our office and allow 72 hours for refills to be completed.    Today you received the following chemotherapy and/or immunotherapy agents: Keytruda, Alimta.       To help prevent nausea and vomiting after your treatment, we encourage you to take your nausea medication as directed.  BELOW ARE SYMPTOMS THAT SHOULD BE REPORTED IMMEDIATELY: *FEVER GREATER THAN 100.4 F (38 C) OR HIGHER *CHILLS OR SWEATING *NAUSEA AND VOMITING THAT IS NOT CONTROLLED WITH YOUR NAUSEA MEDICATION *UNUSUAL SHORTNESS OF BREATH *UNUSUAL BRUISING OR BLEEDING *URINARY PROBLEMS (pain or burning when urinating, or frequent urination) *BOWEL PROBLEMS (unusual diarrhea, constipation, pain near the anus) TENDERNESS IN MOUTH AND THROAT WITH OR WITHOUT PRESENCE OF ULCERS (sore throat, sores in mouth, or a toothache) UNUSUAL RASH, SWELLING OR PAIN  UNUSUAL VAGINAL DISCHARGE OR ITCHING   Items with * indicate a potential emergency and should be followed up as soon as possible or go to the Emergency Department if any problems should occur.  Please show the CHEMOTHERAPY ALERT CARD or  IMMUNOTHERAPY ALERT CARD at check-in to the Emergency Department and triage nurse.  Should you have questions after your visit or need to cancel or reschedule your appointment, please contact [facility] - A DEPT OF [facility]  Dept: [phone]  and follow the prompts.  Office hours are 8:00 a.m. to 4:30 p.m. Monday - Friday. Please note that voicemails left after 4:00 p.m. may not be returned until the following business day.  We are closed weekends and major holidays. You have access to a nurse at all times for urgent questions. Please call the main number to the clinic Dept: [phone] and follow the prompts.   For any non-urgent questions, you may also contact your provider using MyChart. We now offer e-Visits for anyone 61 and older to request care online for non-urgent symptoms. For details visit [email or web address].   Also download the MyChart app! Go to the app store, search "MyChart", open the app, select , and log in with your MyChart username and password.

## 2023-11-28 ENCOUNTER — Other Ambulatory Visit: Payer: Self-pay

## 2023-11-28 ENCOUNTER — Encounter: Payer: Self-pay | Admitting: Internal Medicine

## 2023-11-28 ENCOUNTER — Encounter: Payer: Self-pay | Admitting: Nurse Practitioner

## 2023-11-28 MED ORDER — GABAPENTIN 300 MG PO CAPS
600.0000 mg | ORAL_CAPSULE | Freq: Three times a day (TID) | ORAL | 1 refills | Status: DC
  Filled 2023-11-28 – 2024-01-02 (×2): qty 180, 30d supply, fill #0

## 2023-11-29 ENCOUNTER — Other Ambulatory Visit (HOSPITAL_COMMUNITY): Payer: Self-pay

## 2023-12-07 ENCOUNTER — Other Ambulatory Visit: Payer: Self-pay

## 2023-12-13 ENCOUNTER — Ambulatory Visit (HOSPITAL_COMMUNITY): Payer: Medicaid Other

## 2023-12-13 ENCOUNTER — Telehealth: Payer: Self-pay | Admitting: Radiation Therapy

## 2023-12-13 ENCOUNTER — Other Ambulatory Visit: Payer: Self-pay | Admitting: Nurse Practitioner

## 2023-12-13 ENCOUNTER — Other Ambulatory Visit (HOSPITAL_COMMUNITY): Payer: Self-pay

## 2023-12-13 DIAGNOSIS — M792 Neuralgia and neuritis, unspecified: Secondary | ICD-10-CM

## 2023-12-13 DIAGNOSIS — G893 Neoplasm related pain (acute) (chronic): Secondary | ICD-10-CM

## 2023-12-13 DIAGNOSIS — Z515 Encounter for palliative care: Secondary | ICD-10-CM

## 2023-12-13 DIAGNOSIS — C3490 Malignant neoplasm of unspecified part of unspecified bronchus or lung: Secondary | ICD-10-CM

## 2023-12-13 DIAGNOSIS — C7951 Secondary malignant neoplasm of bone: Secondary | ICD-10-CM

## 2023-12-13 MED ORDER — OXYCODONE HCL 10 MG PO TABS
10.0000 mg | ORAL_TABLET | Freq: Four times a day (QID) | ORAL | 0 refills | Status: DC | PRN
  Filled 2023-12-13: qty 60, 15d supply, fill #0

## 2023-12-13 NOTE — Telephone Encounter (Signed)
Mr. Brintnall rescheduled his brain MRI due to the weather. The scan is now on 1/29, and the follow-up with Ashlyn has been moved to 2/5. He is aware of the follow-up change and was thankful for the call back.   Jalene Mullet R.T.(R)(T) Radiation Special Procedures Lead

## 2023-12-18 ENCOUNTER — Inpatient Hospital Stay (HOSPITAL_BASED_OUTPATIENT_CLINIC_OR_DEPARTMENT_OTHER): Payer: Medicaid Other | Admitting: Internal Medicine

## 2023-12-18 ENCOUNTER — Inpatient Hospital Stay: Payer: Medicaid Other

## 2023-12-18 VITALS — BP 129/78 | HR 100 | Temp 97.8°F | Resp 17 | Ht 68.0 in | Wt 158.4 lb

## 2023-12-18 VITALS — BP 139/84 | HR 94 | Resp 16

## 2023-12-18 DIAGNOSIS — Z5112 Encounter for antineoplastic immunotherapy: Secondary | ICD-10-CM | POA: Diagnosis not present

## 2023-12-18 DIAGNOSIS — C3490 Malignant neoplasm of unspecified part of unspecified bronchus or lung: Secondary | ICD-10-CM

## 2023-12-18 DIAGNOSIS — C7931 Secondary malignant neoplasm of brain: Secondary | ICD-10-CM

## 2023-12-18 LAB — CBC WITH DIFFERENTIAL (CANCER CENTER ONLY)
Abs Immature Granulocytes: 0.02 10*3/uL (ref 0.00–0.07)
Basophils Absolute: 0 10*3/uL (ref 0.0–0.1)
Basophils Relative: 0 %
Eosinophils Absolute: 0.1 10*3/uL (ref 0.0–0.5)
Eosinophils Relative: 1 %
HCT: 36.2 % — ABNORMAL LOW (ref 39.0–52.0)
Hemoglobin: 12.5 g/dL — ABNORMAL LOW (ref 13.0–17.0)
Immature Granulocytes: 0 %
Lymphocytes Relative: 12 %
Lymphs Abs: 1 10*3/uL (ref 0.7–4.0)
MCH: 32.6 pg (ref 26.0–34.0)
MCHC: 34.5 g/dL (ref 30.0–36.0)
MCV: 94.5 fL (ref 80.0–100.0)
Monocytes Absolute: 0.8 10*3/uL (ref 0.1–1.0)
Monocytes Relative: 10 %
Neutro Abs: 6.2 10*3/uL (ref 1.7–7.7)
Neutrophils Relative %: 77 %
Platelet Count: 222 10*3/uL (ref 150–400)
RBC: 3.83 MIL/uL — ABNORMAL LOW (ref 4.22–5.81)
RDW: 12.9 % (ref 11.5–15.5)
WBC Count: 8.1 10*3/uL (ref 4.0–10.5)
nRBC: 0 % (ref 0.0–0.2)

## 2023-12-18 LAB — CMP (CANCER CENTER ONLY)
ALT: 33 U/L (ref 0–44)
AST: 25 U/L (ref 15–41)
Albumin: 3.7 g/dL (ref 3.5–5.0)
Alkaline Phosphatase: 65 U/L (ref 38–126)
Anion gap: 6 (ref 5–15)
BUN: 13 mg/dL (ref 6–20)
CO2: 28 mmol/L (ref 22–32)
Calcium: 9.5 mg/dL (ref 8.9–10.3)
Chloride: 106 mmol/L (ref 98–111)
Creatinine: 0.73 mg/dL (ref 0.61–1.24)
GFR, Estimated: >60 mL/min (ref >60)
Glucose, Bld: 149 mg/dL — ABNORMAL HIGH (ref 70–99)
Potassium: 4 mmol/L (ref 3.5–5.1)
Sodium: 140 mmol/L (ref 135–145)
Total Bilirubin: 0.4 mg/dL (ref 0.0–1.2)
Total Protein: 7.2 g/dL (ref 6.5–8.1)

## 2023-12-18 MED ORDER — SODIUM CHLORIDE 0.9% FLUSH
10.0000 mL | Freq: Once | INTRAVENOUS | Status: AC
  Administered 2023-12-18: 10 mL

## 2023-12-18 MED ORDER — SODIUM CHLORIDE 0.9 % IV SOLN: Freq: Once | INTRAVENOUS | Status: AC

## 2023-12-18 MED ORDER — SODIUM CHLORIDE 0.9% FLUSH
10.0000 mL | INTRAVENOUS | Status: DC | PRN
  Administered 2023-12-18: 10 mL

## 2023-12-18 MED ORDER — SODIUM CHLORIDE 0.9 % IV SOLN
200.0000 mg | Freq: Once | INTRAVENOUS | Status: AC
  Administered 2023-12-18: 200 mg via INTRAVENOUS
  Filled 2023-12-18: qty 200

## 2023-12-18 MED ORDER — SODIUM CHLORIDE 0.9 % IV SOLN
500.0000 mg/m2 | Freq: Once | INTRAVENOUS | Status: AC
  Administered 2023-12-18: 900 mg via INTRAVENOUS
  Filled 2023-12-18: qty 20

## 2023-12-18 MED ORDER — PROCHLORPERAZINE MALEATE 10 MG PO TABS
10.0000 mg | ORAL_TABLET | Freq: Once | ORAL | Status: AC
  Administered 2023-12-18: 10 mg via ORAL
  Filled 2023-12-18: qty 1

## 2023-12-18 MED ORDER — HEPARIN SOD (PORK) LOCK FLUSH 100 UNIT/ML IV SOLN
500.0000 [IU] | Freq: Once | INTRAVENOUS | Status: AC | PRN
  Administered 2023-12-18: 500 [IU]

## 2023-12-18 NOTE — Progress Notes (Signed)
[facility] [phone]   [phone]  OFFICE PROGRESS NOTE  Hoy Register, MD [address] [place] [address] 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  Biomarker Findings Microsatellite status - MS-Stable Tumor Mutational Burden - 8 Muts/Mb Genomic Findings For a complete list of the genes assayed, please refer to the Appendix. KEAP1 Q227* CDKN2A/B p16INK4a G89V PRKCI amplification TERC amplification - equivocal? TP53 D290fs*18 8 Disease relevant genes with no reportable alterations: ALK, BRAF, EGFR, ERBB2, KRAS, MET, RET, ROS1  PDL1 TPS  90%  PRIOR THERAPY:  1) Status post palliative radiotherapy to the L2 lesion under the care of Dr. Kathrynn Running. 2) Status post SRS to solitary brain metastasis under the care of Dr. Kathrynn Running  CURRENT THERAPY: Palliative systemic therapy with carboplatin for AUC of 5, Alimta 500 Mg/M2 and Keytruda 200 Mg IV every 3 weeks.  First dose September 05, 2022.  Status post 22 cycles.  Starting from cycle #5 the patient will be on maintenance treatment with Alimta and Keytruda every 3 weeks.  INTERVAL HISTORY: GEROLD SAR II 58 y.o. male returns to the clinic today for follow-up visit. Discussed the use of AI scribe software for clinical note transcription with the patient, who gave verbal consent to proceed.  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 He is undergoing a long-term treatment regimen, presented for his twenty-third cycle. Over the past three weeks since his last visit, he reported no new symptoms or changes in his condition. He denied experiencing chest pain, shortness of breath, cough, nausea, vomiting, or diarrhea. He also reported no recent weight loss, maintaining a stable weight of 158 pounds.        MEDICAL HISTORY: Past Medical History:  Diagnosis Date   Alcohol use disorder, severe, dependence (HCC) 12/26/2016   Diabetes mellitus without complication (HCC)    Metastatic cancer to brain (HCC) 08/05/2022   Metastatic cancer to spine (HCC) 07/19/2022   Pancreatitis unk   Primary lung adenocarcinoma (HCC) 08/05/2022   Suicidal ideation    Type 2 diabetes mellitus with hyperglycemia, without long-term current use of insulin (HCC) 06/13/2022   Formatting of this note might be different from the original. 06/13/2022 A1C 13.8, FSBG 414 Start empagliflozin 5mg /metformin 1000 daily, levemir 20 qhs. (Samples given) Will apply for medassist for pharmacy    ALLERGIES:  is allergic to penicillins and percocet [oxycodone-acetaminophen].  MEDICATIONS:  Current Outpatient Medications  Medication Sig Dispense Refill   Accu-Chek Softclix Lancets lancets Use to check blood sugar 3 times daily. E11.69 100 each 6   acetaminophen (TYLENOL) 500 MG tablet Take 1,000 mg by mouth in the morning and at bedtime.     atorvastatin (LIPITOR) 40 MG tablet Take 1 tablet (40 mg total) by mouth daily. 90 tablet 1   buPROPion (WELLBUTRIN XL) 150 MG 24 hr  tablet Take 1 tablet (150 mg total) by mouth daily. 90 tablet 1   empagliflozin (JARDIANCE) 25 MG TABS tablet Take 1 tablet (25 mg total) by mouth daily before breakfast. 90 tablet 1   folic acid (FOLVITE) 1 MG tablet Take 1 tablet (1 mg  total) by mouth daily. 30 tablet 4   gabapentin (NEURONTIN) 300 MG capsule Take 2 capsules (600 mg total) by mouth 3 (three) times daily. 180 capsule 1   glucose blood (ACCU-CHEK GUIDE TEST) test strip Use as instructed twice daily 100 each 12   glucose blood (ACCU-CHEK GUIDE) test strip Use to check blood sugar 3 times daily. E11.69 100 each 6   insulin glargine (LANTUS SOLOSTAR) 100 UNIT/ML Solostar Pen Inject 50 Units into the skin daily. 15 mL 11   Insulin Pen Needle 32G X 4 MM MISC Use with Insulin pen 100 each 3   Oxycodone HCl 10 MG TABS Take 1 tablet (10 mg) by mouth every 6 hours as needed for severe pain. 60 tablet 0   tadalafil (CIALIS) 10 MG tablet Take 1 tablet (10 mg total) by mouth every other day as needed for erectile dysfunction. 10 tablet 2   No current facility-administered medications for this visit.    SURGICAL HISTORY:  Past Surgical History:  Procedure Laterality Date   IR BONE TUMOR(S)RF ABLATION  08/01/2022   IR IMAGING GUIDED PORT INSERTION  09/21/2022   IR KYPHO LUMBAR INC FX REDUCE BONE BX UNI/BIL CANNULATION INC/IMAGING  08/01/2022    REVIEW OF SYSTEMS:  A comprehensive review of systems was negative.   PHYSICAL EXAMINATION: General appearance: alert, cooperative, and no distress Head: Normocephalic, without obvious abnormality, atraumatic Neck: no adenopathy, no JVD, supple, symmetrical, trachea midline, and thyroid not enlarged, symmetric, no tenderness/mass/nodules Lymph nodes: Cervical, supraclavicular, and axillary nodes normal. Resp: clear to auscultation bilaterally Back: symmetric, no curvature. ROM normal. No CVA tenderness. Cardio: regular rate and rhythm, S1, S2 normal, no murmur, click, rub or gallop GI: soft, non-tender; bowel sounds normal; no masses,  no organomegaly Extremities: extremities normal, atraumatic, no cyanosis or edema  ECOG PERFORMANCE STATUS: 1 - Symptomatic but completely ambulatory  Blood pressure 129/78, pulse 100,  temperature 97.8 F (36.6 C), temperature source Temporal, resp. rate 17, height 5\' 8"  (1.727 m), weight 158 lb 6.4 oz (71.8 kg), SpO2 100%.  LABORATORY DATA: Lab Results  Component Value Date   WBC 7.6 11/27/2023   HGB 12.9 (L) 11/27/2023   HCT 37.5 (L) 11/27/2023   MCV 97.9 11/27/2023   PLT 233 11/27/2023      Chemistry      Component Value Date/Time   NA 140 11/27/2023 0804   NA 140 12/24/2020 1144   NA 142 08/02/2014 1649   K 3.6 11/27/2023 0804   K 4.2 08/02/2014 1649   CL 110 11/27/2023 0804   CL 109 (H) 08/02/2014 1649   CO2 25 11/27/2023 0804   CO2 21 08/02/2014 1649   BUN 14 11/27/2023 0804   BUN 7 12/24/2020 1144   BUN 11 08/02/2014 1649   CREATININE 0.54 (L) 11/27/2023 0804   CREATININE 0.92 08/02/2014 1649      Component Value Date/Time   CALCIUM 8.7 (L) 11/27/2023 0804   CALCIUM 8.6 08/02/2014 1649   ALKPHOS 59 11/27/2023 0804   ALKPHOS 82 08/02/2014 1649   AST 29 11/27/2023 0804   ALT 36 11/27/2023 0804   ALT 132 (H) 08/02/2014 1649   BILITOT 0.4 11/27/2023 0804  RADIOGRAPHIC STUDIES: No results found.   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 He has molecular studies by foundation 1 that showed no actionable mutations and PD-L1 expression was 90%. He underwent palliative radiotherapy to the L2 vertebral body compression fraction as well as solitary brain metastasis with SRS. He is currently undergoing palliative systemic chemotherapy with carboplatin for AUC of 5, Alimta 500 Mg/M2 and Keytruda 200 Mg IV every 3 weeks.  Status  post 22 cycles. Starting from cycle #5 he is on treatment with maintenance Alimta and Keytruda every 3 weeks. He continues to tolerate this treatment well with no concerning adverse effects.    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 Cancer Treatment Follow-up Undergoing 23rd cycle of maintenance treatment with Alimta and Ketruda every 3 weeks. Reports no new symptoms such as chest pain, dyspnea, nausea, vomiting, or diarrhea. Weight stable at 158 pounds. Blood work within normal limits. Tolerating treatment well. Informed consent obtained for continued treatment, including discussion of risks, benefits, and alternatives. Patient understands the need for ongoing monitoring and follow-up imaging. - Administer cycle 23 of treatment today. - Order chest, abdomen, and pelvis scan in two weeks - Schedule follow-up visit in three weeks - Ensure patient contacts imaging center if not called for scan.  For the pain management, he is currently on MS Contin 15 mg p.o. twice daily in addition to Oxy IR for breakthrough pain and managed by the palliative care team.  The patient was advised to call immediately if he has any other concerning symptoms in the interval. The patient voices understanding of current disease status and treatment options and is in agreement with the current care plan.  All questions were answered. The patient knows to call the clinic with any problems, questions or concerns. We can certainly see the patient much sooner if necessary.  The total time spent in the appointment was 20 minutes.  Disclaimer: This note was dictated with voice recognition software.  Similar sounding words can inadvertently be transcribed and may not be corrected upon review.

## 2023-12-18 NOTE — Patient Instructions (Signed)
[facility] - A DEPT OF [facility]   Discharge Instructions: Thank you for choosing [facility] to provide your oncology and hematology care.   If you have a lab appointment with the Cancer Center, please go directly to the Cancer Center and check in at the registration area.   Wear comfortable clothing and clothing appropriate for easy access to any Portacath or PICC line.   We strive to give you quality time with your provider. You may need to reschedule your appointment if you arrive late (15 or more minutes).  Arriving late affects you and other patients whose appointments are after yours.  Also, if you miss three or more appointments without notifying the office, you may be dismissed from the clinic at the provider's discretion.      For prescription refill requests, have your pharmacy contact our office and allow 72 hours for refills to be completed.    Today you received the following chemotherapy and/or immunotherapy agents: Pembrolizumab (Keytruda) and Pemetrexed (Alimta)      To help prevent nausea and vomiting after your treatment, we encourage you to take your nausea medication as directed.  BELOW ARE SYMPTOMS THAT SHOULD BE REPORTED IMMEDIATELY: *FEVER GREATER THAN 100.4 F (38 C) OR HIGHER *CHILLS OR SWEATING *NAUSEA AND VOMITING THAT IS NOT CONTROLLED WITH YOUR NAUSEA MEDICATION *UNUSUAL SHORTNESS OF BREATH *UNUSUAL BRUISING OR BLEEDING *URINARY PROBLEMS (pain or burning when urinating, or frequent urination) *BOWEL PROBLEMS (unusual diarrhea, constipation, pain near the anus) TENDERNESS IN MOUTH AND THROAT WITH OR WITHOUT PRESENCE OF ULCERS (sore throat, sores in mouth, or a toothache) UNUSUAL RASH, SWELLING OR PAIN  UNUSUAL VAGINAL DISCHARGE OR ITCHING   Items with * indicate a potential emergency and should be followed up as soon as possible or go to the Emergency Department if any problems should occur.  Please show the  CHEMOTHERAPY ALERT CARD or IMMUNOTHERAPY ALERT CARD at check-in to the Emergency Department and triage nurse.  Should you have questions after your visit or need to cancel or reschedule your appointment, please contact [facility] - A DEPT OF [facility]  Dept: [phone]  and follow the prompts.  Office hours are 8:00 a.m. to 4:30 p.m. Monday - Friday. Please note that voicemails left after 4:00 p.m. may not be returned until the following business day.  We are closed weekends and major holidays. You have access to a nurse at all times for urgent questions. Please call the main number to the clinic Dept: [phone] and follow the prompts.   For any non-urgent questions, you may also contact your provider using MyChart. We now offer e-Visits for anyone 44 and older to request care online for non-urgent symptoms. For details visit [email or web address].   Also download the MyChart app! Go to the app store, search "MyChart", open the app, select [facility], and log in with your MyChart username and password.

## 2023-12-20 ENCOUNTER — Ambulatory Visit: Payer: Medicaid Other | Admitting: Urology

## 2023-12-20 ENCOUNTER — Ambulatory Visit (HOSPITAL_COMMUNITY)
Admission: RE | Admit: 2023-12-20 | Discharge: 2023-12-20 | Disposition: A | Discharge status: Home / Self Care | Payer: Medicaid Other | Source: Ambulatory Visit | Attending: Radiation Oncology | Admitting: Radiation Oncology

## 2023-12-20 DIAGNOSIS — C7931 Secondary malignant neoplasm of brain: Secondary | ICD-10-CM | POA: Insufficient documentation

## 2023-12-20 MED ORDER — GADOBUTROL 1 MMOL/ML IV SOLN
7.0000 mL | Freq: Once | INTRAVENOUS | Status: AC | PRN
  Administered 2023-12-20: 7 mL via INTRAVENOUS

## 2023-12-26 NOTE — Progress Notes (Signed)
 Radiation Oncology         [phone]) [phone] ________________________________  Name: Samuel [name] MRN: [id number]  Date: 12/27/2023  DOB: [birth year]  Post Treatment Note  CC: [name], MD  [name], MD  Diagnosis:   58 yo man with brain metastases and bony metastases in the lumbar and thoracic spine from adenocarcinoma of the right upper lobe of the lung.  Interval Since Last Radiation:  1 year, 4 months 08/17/2022 through 08/30/2022 Site Technique Total Dose (Gy) Dose per Fx (Gy) Completed Fx Beam Energies  Lumbar Spine: Spine 3D 30/30 3 10/10 10X, 15X  Brain: Brain_SRS IMRT 20/20 20 1/1 6XFFF   Narrative:  I spoke with the patient to conduct his routine scheduled 3 month follow up visit to review results of his recent MRI scans via telephone to spare the patient unnecessary potential exposure in the healthcare setting during the current COVID-19 pandemic.  The patient was notified in advance and gave permission to proceed with this visit format.  He tolerated radiation therapy very well and has appreciated significant improvement in his back pain. He has continued to tolerate the systemic therapy well and completed 4 cycles of the carboplatin /Alimta /Keytruda  and before transitioning to maintenance therapy with Alimta /Keytruda  only, starting from cycle 5. He feels like his energy and strength continue improving since coming off the carboplatinin and he has been able to gradually increase his activity level which he is pleased with.   MRI of the lumbar and thoracic spine were performed 06/09/23 and showed stability of the treated metastatic disease in the lumbar spine (L2) and an overall unchanged appearance of the metastatic disease in the thoracic and lumbar spine compared to the MRI from 03/01/2023. No new lesions or epidural soft tissue tumor was identified.   His restaging CT A/P from 08/28/23 and CT chest scan from 10/10/23 continue to show an excellent response to treatment  with no evidence for recurrence or extraosseous metastatic disease in the thorax, abdomen or pelvis and a stable appearance of the osseous metastatic disease in the thoracolumbar spine. The plan is to continue his current systemic therapy and repeat imaging 01/01/24, prior to his visit with Dr. Sherrod on 01/08/24.   He had a recent MRI brain scan on 12/20/23 that showed resolution of the treated left temporal and right occipital lesions and no definite enhancement of the treated punctate lesion in the right cerebellum. No new lesions were identified. We reviewed these results today.  On review of systems, the patient states that he is doing well in general. He does feel like his back is getting tired more quickly over the past 2 weeks and he has noted some occasional numbness/tingling in his lower extremities.  He also continues with some mild-moderate back pain in the mid to lower back but reports that this is well-controlled with pain medication.  He continues using a back brace for support and has joined [employer] to help with stretching, strength and balance.  He has not noticed any change in his bowel or bladder habits.  He does have occasional muscle cramps/tightness in the low back, at the site of kyphoplasty, particularly in the mornings but these usually resolve spontaneously throughout the day with repositioning, stretching and massage. He denies headaches, nausea, vomiting, dizziness, slurred speech, changes in visual or auditory acuity or tremors.  He has been able to continue his activities at [employer] but is concerned with the numbness/tingling in his legs and feeling weaker in his back.SABRA  ALLERGIES:  is allergic to penicillins and percocet [oxycodone -acetaminophen ].  Meds: Current Outpatient Medications  Medication Sig Dispense Refill   Accu-Chek Softclix Lancets lancets Use to check blood sugar 3 times daily. E11.69 100 each 6   acetaminophen  (TYLENOL ) 500 MG tablet Take 1,000  mg by mouth in the morning and at bedtime.     atorvastatin  (LIPITOR) 40 MG tablet Take 1 tablet (40 mg total) by mouth daily. 90 tablet 1   buPROPion  (WELLBUTRIN  XL) 150 MG 24 hr tablet Take 1 tablet (150 mg total) by mouth daily. 90 tablet 1   empagliflozin  (JARDIANCE ) 25 MG TABS tablet Take 1 tablet (25 mg total) by mouth daily before breakfast. 90 tablet 1   folic acid  (FOLVITE ) 1 MG tablet Take 1 tablet (1 mg total) by mouth daily. 30 tablet 4   gabapentin  (NEURONTIN ) 300 MG capsule Take 2 capsules (600 mg total) by mouth 3 (three) times daily. 180 capsule 1   glucose blood (ACCU-CHEK GUIDE TEST) test strip Use as instructed twice daily 100 each 12   glucose blood (ACCU-CHEK GUIDE) test strip Use to check blood sugar 3 times daily. E11.69 100 each 6   insulin  glargine (LANTUS  SOLOSTAR) 100 UNIT/ML Solostar Pen Inject 50 Units into the skin daily. 15 mL 11   Insulin  Pen Needle 32G X 4 MM MISC Use with Insulin  pen 100 each 3   Oxycodone  HCl 10 MG TABS Take 1 tablet (10 mg) by mouth every 6 hours as needed for severe pain. 60 tablet 0   tadalafil  (CIALIS ) 10 MG tablet Take 1 tablet (10 mg total) by mouth every other day as needed for erectile dysfunction. 10 tablet 2   No current facility-administered medications for this encounter.    Physical Findings:  vitals were not taken for this visit.   /10 Unable to assess due to telephone follow-up visit format.  Lab Findings: Lab Results  Component Value Date   WBC 8.1 12/18/2023   HGB 12.5 (L) 12/18/2023   HCT 36.2 (L) 12/18/2023   MCV 94.5 12/18/2023   PLT 222 12/18/2023     Radiographic Findings: MR Brain W Wo Contrast Result Date: 12/20/2023 CLINICAL DATA:  Brain metastases, assess treatment response EXAM: MRI HEAD WITHOUT AND WITH CONTRAST TECHNIQUE: Multiplanar, multiecho pulse sequences of the brain and surrounding structures were obtained without and with intravenous contrast. CONTRAST:  7mL GADAVIST  GADOBUTROL  1 MMOL/ML IV SOLN  COMPARISON:  09/12/2023 FINDINGS: Brain: Previously noted right inferior cerebellar lesion demonstrates some intrinsic T1 hyperintense signal (series 600, image 112), without definite enhancement (series 1151, image 42). No edema is associated with this focus. No new enhancing lesions. No evidence of acute infarct, hemorrhage, mass, mass effect, or midline shift. No hydrocephalus or extra-axial collection. Pituitary and craniocervical junction within normal limits. Vascular: Normal arterial flow voids. Normal arterial and venous enhancement. Skull and upper cervical spine: Normal marrow signal. Sinuses/Orbits: Mucosal thickening in the ethmoid air cells, right maxillary sinus, and right sphenoid sinus. No acute finding in the orbits. Other: Trace fluid in right mastoid air cells. IMPRESSION: 1. Previously noted right inferior cerebellar lesion demonstrates some intrinsic T1 hyperintense signal, without definite enhancement. No edema is associated with this focus. 2. No new enhancing lesions. Electronically Signed   By: Donald Campion M.D.   On: 12/20/2023 17:59    Impression/Plan: 1. 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  He  has recovered well from the effects of the radiation.  His brain disease continues to appear well controlled on recent MRI brain scan. He continues with mild-moderate back pain that is controlled with pain medications as needed but has noticed a return of intermittent numbness/tingling in the legs and feeling weaker in his back.  We reviewed the results of his recent CT C/A/P scans from October and Nov. 2024, which show a stable appearance of the metastatic lesions in the thoracolumbar spine, particularly at T3 and T9.  However, due to his new onset symptoms, we will proceed with MRI lumbar and thoracic spine for further evaluation. He understands the potential role for palliative  radiotherapy to these areas in the future should there be any evidence of disease progression or if they become more bothersome. We will continue to monitor the brain with repeat MRI scans every 3 months and a telephone follow-up visit thereafter to review results and recommendations from the multidisciplinary brain and spine conference.  We will coordinate for a repeat MRI of the spine in the next week and I will call him with those results.  He knows that he is to call and/or seek evaluation immediately should he develop any uncontrolled pain or neurologic dysfunction as discussed today.  He will continue in routine follow up with Dr. Sherrod as well, for continued management of his systemic disease and is scheduled for restaging CT C/A/P on 01/11/24, prior to his follow up visit with him on 01/08/24. He has a good understanding of our recommendations and is comfortable and in agreement with the stated plan.   I personally spent 30 minutes in this encounter including chart review, reviewing radiological studies, telephone conversation with the patient, entering orders and completing documentation.    Sabra MICAEL Rusk, PA-C

## 2023-12-27 ENCOUNTER — Ambulatory Visit
Admission: RE | Admit: 2023-12-27 | Discharge: 2023-12-27 | Disposition: A | Discharge status: Home / Self Care | Payer: Medicaid Other | Source: Ambulatory Visit | Attending: Urology | Admitting: Urology

## 2023-12-27 ENCOUNTER — Encounter: Payer: Self-pay | Admitting: Urology

## 2023-12-27 DIAGNOSIS — C7931 Secondary malignant neoplasm of brain: Secondary | ICD-10-CM

## 2023-12-27 NOTE — Progress Notes (Signed)
 Telephone nursing appointment for review of most recent MRI-BRAIN results. I verified patient's identity x2 and began nursing interview.   Patient reports doing well. Patient denies any related issues at this time.   Meaningful use complete.   Patient aware of their 11:30am-12/27/23 telephone appointment w/ Ashlyn Bruning PA-C. I left my extension [phone] in case patient needs anything. Patient verbalized understanding. This concludes the nursing interview.   Patient contact [phone]     Rosaline Minerva, LPN

## 2024-01-01 ENCOUNTER — Other Ambulatory Visit: Payer: Self-pay | Admitting: Radiation Therapy

## 2024-01-01 ENCOUNTER — Ambulatory Visit (HOSPITAL_COMMUNITY)
Admission: RE | Admit: 2024-01-01 | Discharge: 2024-01-01 | Disposition: A | Discharge status: Home / Self Care | Payer: Medicaid Other | Source: Ambulatory Visit | Attending: Internal Medicine | Admitting: Internal Medicine

## 2024-01-01 ENCOUNTER — Other Ambulatory Visit: Payer: Self-pay | Admitting: Internal Medicine

## 2024-01-01 ENCOUNTER — Encounter (HOSPITAL_COMMUNITY): Payer: Self-pay

## 2024-01-01 DIAGNOSIS — C3490 Malignant neoplasm of unspecified part of unspecified bronchus or lung: Secondary | ICD-10-CM | POA: Diagnosis present

## 2024-01-01 DIAGNOSIS — C7951 Secondary malignant neoplasm of bone: Secondary | ICD-10-CM

## 2024-01-01 MED ORDER — IOHEXOL 300 MG/ML  SOLN
100.0000 mL | Freq: Once | INTRAMUSCULAR | Status: AC | PRN
  Administered 2024-01-01: 100 mL via INTRAVENOUS

## 2024-01-02 ENCOUNTER — Other Ambulatory Visit: Payer: Self-pay | Admitting: Nurse Practitioner

## 2024-01-02 DIAGNOSIS — C7951 Secondary malignant neoplasm of bone: Secondary | ICD-10-CM

## 2024-01-02 DIAGNOSIS — C3490 Malignant neoplasm of unspecified part of unspecified bronchus or lung: Secondary | ICD-10-CM

## 2024-01-02 DIAGNOSIS — M792 Neuralgia and neuritis, unspecified: Secondary | ICD-10-CM

## 2024-01-02 DIAGNOSIS — Z515 Encounter for palliative care: Secondary | ICD-10-CM

## 2024-01-02 DIAGNOSIS — G893 Neoplasm related pain (acute) (chronic): Secondary | ICD-10-CM

## 2024-01-03 ENCOUNTER — Other Ambulatory Visit (HOSPITAL_COMMUNITY): Payer: Self-pay

## 2024-01-03 ENCOUNTER — Encounter: Payer: Self-pay | Admitting: Internal Medicine

## 2024-01-03 ENCOUNTER — Other Ambulatory Visit: Payer: Self-pay

## 2024-01-03 ENCOUNTER — Other Ambulatory Visit: Payer: Self-pay | Admitting: Radiation Therapy

## 2024-01-03 DIAGNOSIS — C7931 Secondary malignant neoplasm of brain: Secondary | ICD-10-CM

## 2024-01-03 MED ORDER — OXYCODONE HCL 10 MG PO TABS
10.0000 mg | ORAL_TABLET | Freq: Four times a day (QID) | ORAL | 0 refills | Status: DC | PRN
  Filled 2024-01-03: qty 60, 15d supply, fill #0

## 2024-01-04 ENCOUNTER — Ambulatory Visit (HOSPITAL_COMMUNITY)
Admission: RE | Admit: 2024-01-04 | Discharge: 2024-01-04 | Disposition: A | Discharge status: Home / Self Care | Payer: Medicaid Other | Source: Ambulatory Visit | Attending: Radiation Oncology | Admitting: Radiation Oncology

## 2024-01-04 DIAGNOSIS — C7951 Secondary malignant neoplasm of bone: Secondary | ICD-10-CM | POA: Insufficient documentation

## 2024-01-04 MED ORDER — GADOBUTROL 1 MMOL/ML IV SOLN
7.0000 mL | Freq: Once | INTRAVENOUS | Status: AC | PRN
  Administered 2024-01-04: 7 mL via INTRAVENOUS

## 2024-01-05 NOTE — Progress Notes (Signed)
Palliative Medicine [facility]  [phone] [phone]   Name: Samuel [name] Date: 01/05/2024 MRN: [id number]  DOB: [birth year]  Patient Care Team: Hoy Register, MD as PCP - General (Family Medicine) Pickenpack-Cousar, Arty Baumgartner, NP as Nurse Practitioner (Nurse Practitioner)   INTERVAL HISTORY: Samuel [name] is a 58 y.o. 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  Palliative ask to see for symptom management and goals of care.   SOCIAL HISTORY:     reports that he has been smoking cigarettes. He has never used smokeless tobacco. He reports current alcohol use of about 50.0 standard drinks of alcohol per week. He reports that he does not use drugs.  ADVANCE DIRECTIVES:  Patient does not have an advanced directives. Education and packet provided. He has expressed interest in completing. States his sister, Antonino Nienhuis would be his designated Clinical research associate.   CODE STATUS: Full code  PAST MEDICAL HISTORY: Past Medical History:  Diagnosis Date   Alcohol use disorder, severe, dependence (HCC) 12/26/2016   Diabetes mellitus without complication (HCC)    Metastatic cancer to brain (HCC) 08/05/2022   Metastatic cancer to spine (HCC) 07/19/2022   Pancreatitis unk   Primary lung adenocarcinoma (HCC) 08/05/2022   Suicidal ideation    Type 2 diabetes mellitus with hyperglycemia, without long-term current use of insulin (HCC) 06/13/2022   Formatting of this note might be different from the original. 06/13/2022 A1C 13.8, FSBG 414 Start empagliflozin 5mg /metformin 1000 daily, levemir 20 qhs. (Samples given) Will apply for medassist for pharmacy    ALLERGIES:  is allergic to penicillins and percocet [oxycodone-acetaminophen].  MEDICATIONS:  Current Outpatient Medications  Medication Sig Dispense Refill    Accu-Chek Softclix Lancets lancets Use to check blood sugar 3 times daily. E11.69 100 each 6   acetaminophen (TYLENOL) 500 MG tablet Take 1,000 mg by mouth in the morning and at bedtime.     atorvastatin (LIPITOR) 40 MG tablet Take 1 tablet (40 mg total) by mouth daily. 90 tablet 1   buPROPion (WELLBUTRIN XL) 150 MG 24 hr tablet Take 1 tablet (150 mg total) by mouth daily. 90 tablet 1   empagliflozin (JARDIANCE) 25 MG TABS tablet Take 1 tablet (25 mg total) by mouth daily before breakfast. 90 tablet 1   folic acid (FOLVITE) 1 MG tablet Take 1 tablet (1 mg total) by mouth daily. 30 tablet 4   gabapentin (NEURONTIN) 300 MG capsule Take 2 capsules (600 mg total) by mouth 3 (three) times daily. 180 capsule 1   glucose blood (ACCU-CHEK GUIDE TEST) test strip Use as instructed twice daily 100 each 12   glucose blood (ACCU-CHEK GUIDE) test strip Use to check blood sugar 3 times daily. E11.69 100 each 6   insulin glargine (LANTUS SOLOSTAR) 100 UNIT/ML Solostar Pen Inject 50 Units into the skin daily. 15 mL 11   Insulin Pen Needle 32G X 4 MM MISC Use with Insulin pen 100 each 3   Oxycodone HCl 10 MG TABS Take 1 tablet (10 mg) by mouth every 6 hours as needed for severe pain. 60 tablet 0   tadalafil (CIALIS) 10 MG tablet Take 1 tablet (10 mg total) by mouth every other day as needed for erectile dysfunction. 10 tablet 2   No current facility-administered medications for this visit.    VITAL SIGNS: There were no vitals taken for  this visit. There were no vitals filed for this visit.  Estimated body mass index is 24.08 kg/m as calculated from the following:   Height as of 12/18/23: 5\' 8"  (1.727 m).   Weight as of 12/18/23: 158 lb 6.4 oz (71.8 kg).   PERFORMANCE STATUS (ECOG) : 1 - Symptomatic but completely ambulatory   Physical Exam General: NAD Cardiovascular: regular rate and rhythm Pulmonary:normal breathing pattern  Extremities: no edema, no joint deformities, brace in place  Skin: no  rashes Neurological: AAO x4  Discussed the use of AI scribe software for clinical note transcription with the patient, who gave verbal consent to proceed.  IMPRESSION:  I saw Mr. Samuel [name] during his infusion. No acute distress noted. Continues to take things one day at a time. Appetite is good. Some days better than others. Denies concerns of nausea, vomiting, or diarrhea. No issues with constipation, but he acknowledges the potential for it due to his medication. He has used MiraLAX in the past to manage this and is aware of the need to monitor for any changes in bowel habits. Restart Miralax daily if no bowel movement for more than 24-48 hours.   He is managing chronic pain with gabapentin 300mg  three times a day and oxycodone as needed. Pain is somewhat controlled. States some days he only takes gabapentin twice. He previously used morphine extended release, which was discontinued in April 2024. He experiences side effects, particularly feeling increased drowsiness and unmotivated in the mornings, even when splitting doses. The muscle relaxer contributes to this feeling, making it difficult to get motivated in the morning. He has discontinued use of this. Kao reports his pain has worsened over the past month causing some difficulty in activities of daily living at time. He tries to push thru however at times he has to rest and limit movement and activity. He would like to restart his MS Contin for better pain control with plans to follow-up with medical team for possible interventions allowing him to discontinue use in the near future. Education provided on restarting at this time. All questions answered and support provided.  Assessment and Plan  Chronic Cancer Related Pain Patient reports difficulty with current pain management regimen. Noted that muscle relaxant was causing excessive sedation. Previously on morphine extended release with good effects and feels this better managed his  pain when it was at worst. Pain has worsened over the past month.  -Discontinue muscle relaxant. -Resume morphine extended release 15mg , last used in April 2024. -Continue gabapentin 300mg  three times daily. -Continue oxycodone 10mg  as needed for breakthrough pain.  Constipation Risk due to opioid use. Patient has used MiraLAX in the past with good effect. -Advise patient to use MiraLAX as needed for constipation.  Follow-up Patient has upcoming appointment with primary care provider and potential orthopedic referral. -Plan to see patient back in clinic on 01/29/2024 to assess effectiveness of new pain management regimen.  Patient expressed understanding and was in agreement with this plan. He also understands that He can call the clinic at any time with any questions, concerns, or complaints.       Any controlled substances utilized were prescribed in the context of palliative care. PDMP has been reviewed.    Visit consisted of counseling and education dealing with the complex and emotionally intense issues of symptom management and palliative care in the setting of serious and potentially life-threatening illness.  Willette Alma, AGPCNP-BC  Palliative Medicine Team/[facility]

## 2024-01-08 ENCOUNTER — Inpatient Hospital Stay: Payer: Medicaid Other | Attending: Adult Health

## 2024-01-08 ENCOUNTER — Encounter: Payer: Self-pay | Admitting: Nurse Practitioner

## 2024-01-08 ENCOUNTER — Encounter: Payer: Self-pay | Admitting: Internal Medicine

## 2024-01-08 ENCOUNTER — Inpatient Hospital Stay (HOSPITAL_BASED_OUTPATIENT_CLINIC_OR_DEPARTMENT_OTHER): Payer: Medicaid Other | Admitting: Nurse Practitioner

## 2024-01-08 ENCOUNTER — Inpatient Hospital Stay: Payer: Medicaid Other

## 2024-01-08 ENCOUNTER — Inpatient Hospital Stay (HOSPITAL_BASED_OUTPATIENT_CLINIC_OR_DEPARTMENT_OTHER): Payer: Medicaid Other | Admitting: Internal Medicine

## 2024-01-08 ENCOUNTER — Other Ambulatory Visit: Payer: Self-pay | Admitting: Physician Assistant

## 2024-01-08 ENCOUNTER — Other Ambulatory Visit (HOSPITAL_COMMUNITY): Payer: Self-pay

## 2024-01-08 VITALS — BP 120/69 | HR 86 | Temp 97.7°F | Resp 16 | Ht 68.0 in | Wt 155.1 lb

## 2024-01-08 DIAGNOSIS — G893 Neoplasm related pain (acute) (chronic): Secondary | ICD-10-CM | POA: Diagnosis not present

## 2024-01-08 DIAGNOSIS — C7951 Secondary malignant neoplasm of bone: Secondary | ICD-10-CM | POA: Diagnosis not present

## 2024-01-08 DIAGNOSIS — C3491 Malignant neoplasm of unspecified part of right bronchus or lung: Secondary | ICD-10-CM | POA: Diagnosis not present

## 2024-01-08 DIAGNOSIS — Z7984 Long term (current) use of oral hypoglycemic drugs: Secondary | ICD-10-CM | POA: Insufficient documentation

## 2024-01-08 DIAGNOSIS — E1169 Type 2 diabetes mellitus with other specified complication: Secondary | ICD-10-CM

## 2024-01-08 DIAGNOSIS — J439 Emphysema, unspecified: Secondary | ICD-10-CM | POA: Insufficient documentation

## 2024-01-08 DIAGNOSIS — Z5111 Encounter for antineoplastic chemotherapy: Secondary | ICD-10-CM | POA: Insufficient documentation

## 2024-01-08 DIAGNOSIS — Z515 Encounter for palliative care: Secondary | ICD-10-CM

## 2024-01-08 DIAGNOSIS — Z79899 Other long term (current) drug therapy: Secondary | ICD-10-CM | POA: Insufficient documentation

## 2024-01-08 DIAGNOSIS — Z5112 Encounter for antineoplastic immunotherapy: Secondary | ICD-10-CM | POA: Diagnosis present

## 2024-01-08 DIAGNOSIS — E119 Type 2 diabetes mellitus without complications: Secondary | ICD-10-CM | POA: Diagnosis not present

## 2024-01-08 DIAGNOSIS — C7931 Secondary malignant neoplasm of brain: Secondary | ICD-10-CM | POA: Diagnosis not present

## 2024-01-08 DIAGNOSIS — C3411 Malignant neoplasm of upper lobe, right bronchus or lung: Secondary | ICD-10-CM | POA: Insufficient documentation

## 2024-01-08 DIAGNOSIS — C3490 Malignant neoplasm of unspecified part of unspecified bronchus or lung: Secondary | ICD-10-CM

## 2024-01-08 DIAGNOSIS — I700 Atherosclerosis of aorta: Secondary | ICD-10-CM | POA: Insufficient documentation

## 2024-01-08 DIAGNOSIS — R530 Neoplastic (malignant) related fatigue: Secondary | ICD-10-CM | POA: Diagnosis not present

## 2024-01-08 DIAGNOSIS — F1020 Alcohol dependence, uncomplicated: Secondary | ICD-10-CM | POA: Diagnosis not present

## 2024-01-08 LAB — CBC WITH DIFFERENTIAL (CANCER CENTER ONLY)
Abs Immature Granulocytes: 0.02 10*3/uL (ref 0.00–0.07)
Basophils Absolute: 0 10*3/uL (ref 0.0–0.1)
Basophils Relative: 1 %
Eosinophils Absolute: 0.1 10*3/uL (ref 0.0–0.5)
Eosinophils Relative: 1 %
HCT: 38.9 % — ABNORMAL LOW (ref 39.0–52.0)
Hemoglobin: 13.4 g/dL (ref 13.0–17.0)
Immature Granulocytes: 0 %
Lymphocytes Relative: 16 %
Lymphs Abs: 1 10*3/uL (ref 0.7–4.0)
MCH: 33.8 pg (ref 26.0–34.0)
MCHC: 34.4 g/dL (ref 30.0–36.0)
MCV: 98 fL (ref 80.0–100.0)
Monocytes Absolute: 0.7 10*3/uL (ref 0.1–1.0)
Monocytes Relative: 10 %
Neutro Abs: 4.7 10*3/uL (ref 1.7–7.7)
Neutrophils Relative %: 72 %
Platelet Count: 237 10*3/uL (ref 150–400)
RBC: 3.97 MIL/uL — ABNORMAL LOW (ref 4.22–5.81)
RDW: 11.9 % (ref 11.5–15.5)
WBC Count: 6.5 10*3/uL (ref 4.0–10.5)
nRBC: 0 % (ref 0.0–0.2)

## 2024-01-08 LAB — CMP (CANCER CENTER ONLY)
ALT: 24 U/L (ref 0–44)
AST: 21 U/L (ref 15–41)
Albumin: 3.7 g/dL (ref 3.5–5.0)
Alkaline Phosphatase: 75 U/L (ref 38–126)
Anion gap: 6 (ref 5–15)
BUN: 13 mg/dL (ref 6–20)
CO2: 25 mmol/L (ref 22–32)
Calcium: 8.7 mg/dL — ABNORMAL LOW (ref 8.9–10.3)
Chloride: 102 mmol/L (ref 98–111)
Creatinine: 0.67 mg/dL (ref 0.61–1.24)
GFR, Estimated: >60 mL/min (ref >60)
Glucose, Bld: 411 mg/dL — ABNORMAL HIGH (ref 70–99)
Potassium: 3.8 mmol/L (ref 3.5–5.1)
Sodium: 133 mmol/L — ABNORMAL LOW (ref 135–145)
Total Bilirubin: 0.3 mg/dL (ref 0.0–1.2)
Total Protein: 6.9 g/dL (ref 6.5–8.1)

## 2024-01-08 LAB — TSH: TSH: 1.158 u[IU]/mL (ref 0.350–4.500)

## 2024-01-08 MED ORDER — HEPARIN SOD (PORK) LOCK FLUSH 100 UNIT/ML IV SOLN
500.0000 [IU] | Freq: Once | INTRAVENOUS | Status: AC | PRN
Start: 2024-01-08 — End: 2024-01-08
  Administered 2024-01-08: 500 [IU]

## 2024-01-08 MED ORDER — INSULIN ASPART 100 UNIT/ML IJ SOLN
10.0000 [IU] | Freq: Once | INTRAMUSCULAR | Status: AC
  Administered 2024-01-08: 10 [IU] via SUBCUTANEOUS
  Filled 2024-01-08: qty 1

## 2024-01-08 MED ORDER — PROCHLORPERAZINE MALEATE 10 MG PO TABS
10.0000 mg | ORAL_TABLET | Freq: Once | ORAL | Status: AC
  Administered 2024-01-08: 10 mg via ORAL
  Filled 2024-01-08: qty 1

## 2024-01-08 MED ORDER — CYANOCOBALAMIN 1000 MCG/ML IJ SOLN
1000.0000 ug | Freq: Once | INTRAMUSCULAR | Status: AC
  Administered 2024-01-08: 1000 ug via INTRAMUSCULAR
  Filled 2024-01-08: qty 1

## 2024-01-08 MED ORDER — SODIUM CHLORIDE 0.9% FLUSH
10.0000 mL | Freq: Once | INTRAVENOUS | Status: AC
  Administered 2024-01-08: 10 mL

## 2024-01-08 MED ORDER — SODIUM CHLORIDE 0.9% FLUSH
10.0000 mL | INTRAVENOUS | Status: DC | PRN
  Administered 2024-01-08: 10 mL

## 2024-01-08 MED ORDER — SODIUM CHLORIDE 0.9 % IV SOLN
500.0000 mg/m2 | Freq: Once | INTRAVENOUS | Status: AC
  Administered 2024-01-08: 900 mg via INTRAVENOUS
  Filled 2024-01-08: qty 20

## 2024-01-08 MED ORDER — SODIUM CHLORIDE 0.9 % IV SOLN: Freq: Once | INTRAVENOUS | Status: AC

## 2024-01-08 MED ORDER — SODIUM CHLORIDE 0.9 % IV SOLN
200.0000 mg | Freq: Once | INTRAVENOUS | Status: AC
  Administered 2024-01-08: 200 mg via INTRAVENOUS
  Filled 2024-01-08: qty 200

## 2024-01-08 MED ORDER — MORPHINE SULFATE ER 15 MG PO TBCR
15.0000 mg | EXTENDED_RELEASE_TABLET | Freq: Two times a day (BID) | ORAL | 0 refills | Status: DC
Start: 2024-01-08 — End: 2024-01-18
  Filled 2024-01-08: qty 60, 30d supply, fill #0

## 2024-01-08 NOTE — Progress Notes (Signed)
[facility] [phone]   [phone]  OFFICE PROGRESS NOTE  Samuel Register, MD 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  Biomarker Findings Microsatellite status - MS-Stable Tumor Mutational Burden - 8 Muts/Mb Genomic Findings For a complete list of the genes assayed, please refer to the Appendix. KEAP1 Q227* CDKN2A/B [id number] G89V PRKCI amplification TERC amplification - equivocal? TP53 [id number]*18 8 Disease relevant genes with no reportable alterations: ALK, BRAF, EGFR, ERBB2, KRAS, MET, RET, ROS1  PDL1 TPS  90%  PRIOR THERAPY:  1) Status post palliative radiotherapy to the L2 lesion under the care of Dr. Kathrynn Running. 2) Status post SRS to solitary brain metastasis under the care of Dr. Kathrynn Running  CURRENT THERAPY: Palliative systemic therapy with carboplatin for AUC of 5, Alimta 500 Mg/M2 and Keytruda 200 Mg IV every 3 weeks.  First dose September 05, 2022.  Status post 22 cycles.  Starting from cycle #5 the patient will be on maintenance treatment with Alimta and Keytruda every 3 weeks.  INTERVAL HISTORY: Samuel [name] 58 y.o. male returns to the clinic today for follow-up visit. Discussed the use of AI scribe software for clinical note transcription with the patient, who gave verbal consent to proceed.  History of Present Illness   Samuel [name] is a 58 year old male undergoing treatment for cancer who presents for chemotherapy follow-up.  He has been receiving Carboplatin, Alimta and Keytruda for cancer treatment. Initially, he underwent four  cycles of these medications, followed by maintenance therapy with Alimta and Keytruda every three weeks starting from the fifth cycle. To date, he has completed a total of twenty-three cycles.  Recent imaging, including a scan of the chest, abdomen, and pelvis, showed no cancer spread, although there is some inflammation in the lung. An MRI of the thoracolumbar spine also returned normal, with no new spots detected.  He feels 'pretty good' overall since his last visit three weeks ago, with no new complaints of nausea, vomiting, diarrhea, or headaches. However, he experiences fatigue, which he attributes to the chemotherapy. He is attempting to stay active to manage this symptom. No new nausea, vomiting, diarrhea, or headaches.       MEDICAL HISTORY: Past Medical History:  Diagnosis Date   Alcohol use disorder, severe, dependence (HCC) 12/26/2016   Diabetes mellitus without complication (HCC)    Metastatic cancer to brain (HCC) 08/05/2022   Metastatic cancer to spine (HCC) 07/19/2022   Pancreatitis unk   Primary lung adenocarcinoma (HCC) 08/05/2022   Suicidal ideation    Type 2 diabetes mellitus with hyperglycemia, without long-term current use of insulin (HCC) 06/13/2022   Formatting of this note might be different from the original. 06/13/2022 A1C 13.8, FSBG 414 Start empagliflozin 5mg /metformin 1000 daily, levemir 20 qhs. (Samples given) Will apply for medassist for pharmacy    ALLERGIES:  is allergic to penicillins and percocet [oxycodone-acetaminophen].  MEDICATIONS:  Current Outpatient Medications  Medication Sig Dispense Refill   Accu-Chek Softclix Lancets lancets Use to check blood sugar 3 times daily. E11.69 100 each 6   acetaminophen (TYLENOL) 500 MG tablet Take 1,000 mg by mouth  in the morning and at bedtime.     atorvastatin (LIPITOR) 40 MG tablet Take 1 tablet (40 mg total) by mouth daily. 90 tablet 1   buPROPion (WELLBUTRIN XL) 150 MG 24 hr tablet Take 1 tablet (150 mg  total) by mouth daily. 90 tablet 1   empagliflozin (JARDIANCE) 25 MG TABS tablet Take 1 tablet (25 mg total) by mouth daily before breakfast. 90 tablet 1   folic acid (FOLVITE) 1 MG tablet Take 1 tablet (1 mg total) by mouth daily. 30 tablet 4   gabapentin (NEURONTIN) 300 MG capsule Take 2 capsules (600 mg total) by mouth 3 (three) times daily. 180 capsule 1   glucose blood (ACCU-CHEK GUIDE TEST) test strip Use as instructed twice daily 100 each 12   glucose blood (ACCU-CHEK GUIDE) test strip Use to check blood sugar 3 times daily. E11.69 100 each 6   insulin glargine (LANTUS SOLOSTAR) 100 UNIT/ML Solostar Pen Inject 50 Units into the skin daily. 15 mL 11   Insulin Pen Needle 32G X 4 MM MISC Use with Insulin pen 100 each 3   Oxycodone HCl 10 MG TABS Take 1 tablet (10 mg) by mouth every 6 hours as needed for severe pain. 60 tablet 0   tadalafil (CIALIS) 10 MG tablet Take 1 tablet (10 mg total) by mouth every other day as needed for erectile dysfunction. 10 tablet 2   No current facility-administered medications for this visit.    SURGICAL HISTORY:  Past Surgical History:  Procedure Laterality Date   IR BONE TUMOR(S)RF ABLATION  08/01/2022   IR IMAGING GUIDED PORT INSERTION  09/21/2022   IR KYPHO LUMBAR INC FX REDUCE BONE BX UNI/BIL CANNULATION INC/IMAGING  08/01/2022    REVIEW OF SYSTEMS:  Constitutional: positive for fatigue Eyes: negative Ears, nose, mouth, throat, and face: negative Respiratory: negative Cardiovascular: negative Gastrointestinal: negative Genitourinary:negative Integument/breast: negative Hematologic/lymphatic: negative Musculoskeletal:negative Neurological: negative Behavioral/Psych: negative Endocrine: negative Allergic/Immunologic: negative   PHYSICAL EXAMINATION: General appearance: alert, cooperative, fatigued, and no distress Head: Normocephalic, without obvious abnormality, atraumatic Neck: no adenopathy, no JVD, supple, symmetrical, trachea midline, and  thyroid not enlarged, symmetric, no tenderness/mass/nodules Lymph nodes: Cervical, supraclavicular, and axillary nodes normal. Resp: clear to auscultation bilaterally Back: symmetric, no curvature. ROM normal. No CVA tenderness. Cardio: regular rate and rhythm, S1, S2 normal, no murmur, click, rub or gallop GI: soft, non-tender; bowel sounds normal; no masses,  no organomegaly Extremities: extremities normal, atraumatic, no cyanosis or edema Neurologic: Alert and oriented X 3, normal strength and tone. Normal symmetric reflexes. Normal coordination and gait  ECOG PERFORMANCE STATUS: 1 - Symptomatic but completely ambulatory  Blood pressure 120/69, pulse 86, temperature 97.7 F (36.5 C), temperature source Oral, resp. rate 16, height 5\' 8"  (1.727 m), weight 155 lb 1.6 oz (70.4 kg), SpO2 100%.  LABORATORY DATA: Lab Results  Component Value Date   WBC 6.5 01/08/2024   HGB 13.4 01/08/2024   HCT 38.9 (L) 01/08/2024   MCV 98.0 01/08/2024   PLT 237 01/08/2024      Chemistry      Component Value Date/Time   NA 140 12/18/2023 0834   NA 140 12/24/2020 1144   NA 142 08/02/2014 1649   K 4.0 12/18/2023 0834   K 4.2 08/02/2014 1649   CL 106 12/18/2023 0834   CL 109 (H) 08/02/2014 1649   CO2 28 12/18/2023 0834   CO2 21 08/02/2014 1649   BUN 13 12/18/2023 0834   BUN 7 12/24/2020 1144   BUN  11 08/02/2014 1649   CREATININE 0.73 12/18/2023 0834   CREATININE 0.92 08/02/2014 1649      Component Value Date/Time   CALCIUM 9.5 12/18/2023 0834   CALCIUM 8.6 08/02/2014 1649   ALKPHOS 65 12/18/2023 0834   ALKPHOS 82 08/02/2014 1649   AST 25 12/18/2023 0834   ALT 33 12/18/2023 0834   ALT 132 (H) 08/02/2014 1649   BILITOT 0.4 12/18/2023 0834       RADIOGRAPHIC STUDIES: MR Lumbar Spine W Wo Contrast Result Date: 01/04/2024 CLINICAL DATA:  Metastatic disease evaluation please scan T and L; Metastatic disease evaluation Hx of spinal mets- new numbness and tingling in lower ext EXAM: MRI  THORACIC AND LUMBAR SPINE WITHOUT AND WITH CONTRAST TECHNIQUE: Multiplanar and multiecho pulse sequences of the thoracic and lumbar spine were obtained without and with intravenous contrast. CONTRAST:  7mL GADAVIST GADOBUTROL 1 MMOL/ML IV SOLN COMPARISON:  None Available. FINDINGS: MRI THORACIC SPINE FINDINGS Alignment:  No substantial sagittal subluxation. Vertebrae: STIR hyperintense T3 and T9 lesions are unchanged. No new lesions identified in the thoracic spine. Cord: Normal cord signal.  No abnormal enhancement. Paraspinal and other soft tissues: Unremarkable. Disc levels: No significant canal or foraminal stenosis. MRI LUMBAR SPINE FINDINGS Segmentation: Standard. Alignment:  Similar.  No substantial sagittal subluxation. Vertebrae: Similar L2 metastatic disease with pathologic compression fracture E and advanced height loss. Similar bony retropulsion with similar canal stenosis. Similar extension weighted fatty marrow at L1 and below, due to radiotherapy similar chronic L4 superior endplate deformity. Similar hypodense appearance of the L3 posterior body marrow. No new lesions identified. Conus medullaris: Extends to the L1-L2 level and appears normal. Paraspinal and other soft tissues: No paraspinal mass or inflammation. Disc levels: L1-L2: Canal stenosis from bony retropulsion is unchanged. Mild bilateral foraminal stenosis L2-L3: Canal stenosis or bony retropulsion is unchanged. Similar biforaminal stenosis. L3-L4: Mild disc bulge.  No significant stenosis. L4-L5: Disc bulging in facet arthropathy. Similar moderate left foraminal stenosis. Pain canal and right foramen. L5-S1: Similar mild bilateral foraminal stenosis. Facet arthropathy. IMPRESSION: 1. No new or progressive metastatic disease in the thoracic and lumbar spine. 2. Unchanged L2 compression fracture with bony retropulsion contributing to similar canal and bilateral foraminal stenosis at L2-L3. Electronically Signed   By: Feliberto Harts M.D.    On: 01/04/2024 20:10   MR THORACIC SPINE W WO CONTRAST Result Date: 01/04/2024 CLINICAL DATA:  Metastatic disease evaluation please scan T and L; Metastatic disease evaluation Hx of spinal mets- new numbness and tingling in lower ext EXAM: MRI THORACIC AND LUMBAR SPINE WITHOUT AND WITH CONTRAST TECHNIQUE: Multiplanar and multiecho pulse sequences of the thoracic and lumbar spine were obtained without and with intravenous contrast. CONTRAST:  7mL GADAVIST GADOBUTROL 1 MMOL/ML IV SOLN COMPARISON:  None Available. FINDINGS: MRI THORACIC SPINE FINDINGS Alignment:  No substantial sagittal subluxation. Vertebrae: STIR hyperintense T3 and T9 lesions are unchanged. No new lesions identified in the thoracic spine. Cord: Normal cord signal.  No abnormal enhancement. Paraspinal and other soft tissues: Unremarkable. Disc levels: No significant canal or foraminal stenosis. MRI LUMBAR SPINE FINDINGS Segmentation: Standard. Alignment:  Similar.  No substantial sagittal subluxation. Vertebrae: Similar L2 metastatic disease with pathologic compression fracture E and advanced height loss. Similar bony retropulsion with similar canal stenosis. Similar extension weighted fatty marrow at L1 and below, due to radiotherapy similar chronic L4 superior endplate deformity. Similar hypodense appearance of the L3 posterior body marrow. No new lesions identified. Conus medullaris: Extends to the L1-L2 level and appears  normal. Paraspinal and other soft tissues: No paraspinal mass or inflammation. Disc levels: L1-L2: Canal stenosis from bony retropulsion is unchanged. Mild bilateral foraminal stenosis L2-L3: Canal stenosis or bony retropulsion is unchanged. Similar biforaminal stenosis. L3-L4: Mild disc bulge.  No significant stenosis. L4-L5: Disc bulging in facet arthropathy. Similar moderate left foraminal stenosis. Pain canal and right foramen. L5-S1: Similar mild bilateral foraminal stenosis. Facet arthropathy. IMPRESSION: 1. No new or  progressive metastatic disease in the thoracic and lumbar spine. 2. Unchanged L2 compression fracture with bony retropulsion contributing to similar canal and bilateral foraminal stenosis at L2-L3. Electronically Signed   By: Feliberto Harts M.D.   On: 01/04/2024 20:10   CT CHEST ABDOMEN PELVIS W CONTRAST Result Date: 01/01/2024 CLINICAL DATA:  History of metastatic non-small cell lung cancer. Follow-up * Tracking Code: BO * EXAM: CT CHEST, ABDOMEN, AND PELVIS WITH CONTRAST TECHNIQUE: Multidetector CT imaging of the chest, abdomen and pelvis was performed following the standard protocol during bolus administration of intravenous contrast. RADIATION DOSE REDUCTION: This exam was performed according to the departmental dose-optimization program which includes automated exposure control, adjustment of the mA and/or kV according to patient size and/or use of iterative reconstruction technique. CONTRAST:  OMNIPAQUE IOHEXOL 300 MG/ML  SOLN COMPARISON:  Multiple priors including most recent CT October 10, 2023 and August 28, 2023 FINDINGS: CT CHEST FINDINGS Cardiovascular: Right chest Port-A-Cath with tip at the superior cavoatrial junction. Aortic atherosclerosis. No central pulmonary embolus on this nondedicated study. Normal size heart. No significant pericardial effusion/thickening. Mediastinum/Nodes: No suspicious thyroid nodule. Pathologically enlarged mediastinal, hilar or axillary lymph nodes. Symmetric esophageal wall thickening similar prior. Lungs/Pleura: Biapical pleuroparenchymal scarring. Mild emphysema. No suspicious pulmonary nodules or masses. No pleural effusion. No pneumothorax. Musculoskeletal: Similar appearance of the sclerosis and lucency in the thoracic spine most notably involving the T3 vertebral body level. No new aggressive lytic or blastic lesion of bone. Remote bilateral rib fractures and right clavicular fracture. CT ABDOMEN PELVIS FINDINGS Hepatobiliary: No suspicious hepatic  lesion. Stable 4 mm hypodensity in the posterior right hepatic lobe favored benign. Gallbladder is unremarkable. No biliary ductal dilation. Pancreas: No pancreatic ductal dilation or evidence of acute inflammation. Spleen: No splenomegaly or focal splenic lesion. Adrenals/Urinary Tract: Progressive lobular thickening of the right adrenal gland without discrete nodularity. Left adrenal gland appears normal. No hydronephrosis. Kidneys demonstrate symmetric enhancement. Punctate nonobstructive right renal calculi. Urinary bladder is unremarkable for degree of distension. Stomach/Bowel: Stomach is unremarkable for degree of distension. No pathologic dilation of small or large bowel. No evidence of acute bowel inflammation. Vascular/Lymphatic: Aortic atherosclerosis. Normal caliber abdominal aorta. Smooth IVC contours. The portal, splenic and superior mesenteric veins are patent. No pathologically enlarged abdominal or pelvic lymph nodes. Reproductive: Prostate is unremarkable. Other: Similar nonspecific trace abdominopelvic free fluid. Musculoskeletal: Prior L2 vertebral body augmentation with similar vertebral plana and 7 mm of posterior bony retropulsion. Stable appearance of the mixed sclerotic and lucencies in portions of the lumbar spine most notably involving L3 posteriorly extending into the right posterior elements compatible with metastatic lesions. No new or progressive osseous metastasis identified. IMPRESSION: 1. Stable appearance of the mixed sclerotic and lucencies in portions of the thoracic and lumbar spine. No new or progressive osseous metastasis identified. 2. Progressive lobular thickening of the right adrenal gland without discrete nodularity, nonspecific and possibly reflecting adrenal hyperplasia. Suggest attention on follow-up imaging. 3. No definitive evidence of new or progressive metastatic disease in the chest, abdomen or pelvis. 4. Similar nonspecific trace abdominopelvic  free fluid. 5.  Symmetric distal esophageal wall thickening similar prior most commonly reflecting esophagitis. 6. Aortic Atherosclerosis (ICD10-I70.0) and Emphysema (ICD10-J43.9). Electronically Signed   By: Maudry Mayhew M.D.   On: 01/01/2024 15:27   MR Brain W Wo Contrast Result Date: 12/20/2023 CLINICAL DATA:  Brain metastases, assess treatment response EXAM: MRI HEAD WITHOUT AND WITH CONTRAST TECHNIQUE: Multiplanar, multiecho pulse sequences of the brain and surrounding structures were obtained without and with intravenous contrast. CONTRAST:  7mL GADAVIST GADOBUTROL 1 MMOL/ML IV SOLN COMPARISON:  09/12/2023 FINDINGS: Brain: Previously noted right inferior cerebellar lesion demonstrates some intrinsic T1 hyperintense signal (series 600, image 112), without definite enhancement (series 1151, image 42). No edema is associated with this focus. No new enhancing lesions. No evidence of acute infarct, hemorrhage, mass, mass effect, or midline shift. No hydrocephalus or extra-axial collection. Pituitary and craniocervical junction within normal limits. Vascular: Normal arterial flow voids. Normal arterial and venous enhancement. Skull and upper cervical spine: Normal marrow signal. Sinuses/Orbits: Mucosal thickening in the ethmoid air cells, right maxillary sinus, and right sphenoid sinus. No acute finding in the orbits. Other: Trace fluid in right mastoid air cells. IMPRESSION: 1. Previously noted right inferior cerebellar lesion demonstrates some intrinsic T1 hyperintense signal, without definite enhancement. No edema is associated with this focus. 2. No new enhancing lesions. Electronically Signed   By: Wiliam Ke M.D.   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 He has molecular studies by foundation 1 that showed no actionable mutations and PD-L1 expression was 90%. He underwent palliative radiotherapy to the L2 vertebral body compression fraction as well as solitary brain metastasis with SRS. He is currently undergoing palliative systemic chemotherapy with carboplatin for AUC of 5, Alimta 500 Mg/M2 and Keytruda 200 Mg IV every 3 weeks.  Status post 23 cycles. Starting from cycle #5 he is on treatment with maintenance Alimta and Keytruda every 3 weeks.  He has been tolerating this treatment well with no concerning adverse effects.    Non-Small Cell Lung Cancer (NSCLC) Currently on maintenance therapy with Alimta and Keytruda every three weeks, having completed twenty-three cycles. Recent imaging (chest, abdomen, pelvis) showed no metastasis but some lung inflammation. MRI of the thoracolumbar spine showed no new lesions. Reports chemotherapy-induced fatigue. - Administer cycle twenty-four of Alimta and Keytruda today - Schedule follow-up in three weeks for the next maintenance therapy - Encourage increased physical activity to manage fatigue.    For the pain management, he is currently on MS Contin 15 mg p.o. twice daily in addition to Oxy IR for breakthrough pain and managed by the palliative care team.  The patient was advised to call immediately if he has any other concerning symptoms in the interval. The patient voices understanding of current disease status and treatment options and is in agreement with the current care plan.  All questions were answered. The patient knows to call the clinic with any problems, questions or concerns. We can certainly see the patient much sooner if necessary.  The total time spent in the appointment was 20 minutes.  Disclaimer: This  note  was dictated with voice recognition software. Similar sounding words can inadvertently be transcribed and may not be corrected upon review.

## 2024-01-09 LAB — T4: T4, Total: 10.2 ug/dL (ref 4.5–12.0)

## 2024-01-11 ENCOUNTER — Other Ambulatory Visit (HOSPITAL_COMMUNITY): Payer: Self-pay

## 2024-01-13 NOTE — Progress Notes (Signed)
 Updated pemetrexed ERX to [id number]   Pryor Ochoa, PharmD 01/13/24

## 2024-01-16 ENCOUNTER — Other Ambulatory Visit: Payer: Self-pay | Admitting: Neurosurgery

## 2024-01-16 ENCOUNTER — Other Ambulatory Visit: Payer: Self-pay | Admitting: Radiation Therapy

## 2024-01-17 ENCOUNTER — Other Ambulatory Visit: Payer: Self-pay | Admitting: Neurosurgery

## 2024-01-18 ENCOUNTER — Other Ambulatory Visit: Payer: Self-pay

## 2024-01-18 ENCOUNTER — Other Ambulatory Visit (HOSPITAL_COMMUNITY): Payer: Self-pay

## 2024-01-18 ENCOUNTER — Other Ambulatory Visit: Payer: Self-pay | Admitting: Nurse Practitioner

## 2024-01-18 ENCOUNTER — Ambulatory Visit: Payer: Medicaid Other | Attending: Family Medicine | Admitting: Family Medicine

## 2024-01-18 ENCOUNTER — Encounter: Payer: Self-pay | Admitting: Family Medicine

## 2024-01-18 ENCOUNTER — Telehealth: Payer: Self-pay

## 2024-01-18 VITALS — BP 135/80 | HR 81 | Ht 68.0 in | Wt 152.0 lb

## 2024-01-18 DIAGNOSIS — C3490 Malignant neoplasm of unspecified part of unspecified bronchus or lung: Secondary | ICD-10-CM

## 2024-01-18 DIAGNOSIS — Z794 Long term (current) use of insulin: Secondary | ICD-10-CM

## 2024-01-18 DIAGNOSIS — Z515 Encounter for palliative care: Secondary | ICD-10-CM

## 2024-01-18 DIAGNOSIS — F17200 Nicotine dependence, unspecified, uncomplicated: Secondary | ICD-10-CM

## 2024-01-18 DIAGNOSIS — M792 Neuralgia and neuritis, unspecified: Secondary | ICD-10-CM

## 2024-01-18 DIAGNOSIS — F32A Depression, unspecified: Secondary | ICD-10-CM

## 2024-01-18 DIAGNOSIS — E1142 Type 2 diabetes mellitus with diabetic polyneuropathy: Secondary | ICD-10-CM | POA: Diagnosis not present

## 2024-01-18 DIAGNOSIS — E669 Obesity, unspecified: Secondary | ICD-10-CM | POA: Diagnosis not present

## 2024-01-18 DIAGNOSIS — C7951 Secondary malignant neoplasm of bone: Secondary | ICD-10-CM

## 2024-01-18 DIAGNOSIS — Z1211 Encounter for screening for malignant neoplasm of colon: Secondary | ICD-10-CM

## 2024-01-18 DIAGNOSIS — G893 Neoplasm related pain (acute) (chronic): Secondary | ICD-10-CM

## 2024-01-18 DIAGNOSIS — R1909 Other intra-abdominal and pelvic swelling, mass and lump: Secondary | ICD-10-CM

## 2024-01-18 DIAGNOSIS — Z7984 Long term (current) use of oral hypoglycemic drugs: Secondary | ICD-10-CM

## 2024-01-18 DIAGNOSIS — Z23 Encounter for immunization: Secondary | ICD-10-CM

## 2024-01-18 DIAGNOSIS — F17210 Nicotine dependence, cigarettes, uncomplicated: Secondary | ICD-10-CM

## 2024-01-18 LAB — POCT GLYCOSYLATED HEMOGLOBIN (HGB A1C): HbA1c, POC (controlled diabetic range): 10.5 % — AB (ref 0.0–7.0)

## 2024-01-18 MED ORDER — BUPROPION HCL ER (XL) 150 MG PO TB24
150.0000 mg | ORAL_TABLET | Freq: Every day | ORAL | 1 refills | Status: DC
  Filled 2024-01-18: qty 90, 90d supply, fill #0

## 2024-01-18 MED ORDER — EMPAGLIFLOZIN 25 MG PO TABS
25.0000 mg | ORAL_TABLET | Freq: Every day | ORAL | 1 refills | Status: DC
  Filled 2024-01-18: qty 90, 90d supply, fill #0

## 2024-01-18 MED ORDER — OXYCODONE HCL ER 10 MG PO T12A
10.0000 mg | EXTENDED_RELEASE_TABLET | Freq: Two times a day (BID) | ORAL | 0 refills | Status: DC
Start: 2024-01-18 — End: 2024-02-16
  Filled 2024-01-18: qty 60, 30d supply, fill #0

## 2024-01-18 MED ORDER — GABAPENTIN 300 MG PO CAPS
600.0000 mg | ORAL_CAPSULE | Freq: Three times a day (TID) | ORAL | 1 refills | Status: DC
  Filled 2024-01-18 – 2024-02-13 (×2): qty 180, 30d supply, fill #0

## 2024-01-18 MED ORDER — LANTUS SOLOSTAR 100 UNIT/ML ~~LOC~~ SOPN
50.0000 [IU] | PEN_INJECTOR | Freq: Every day | SUBCUTANEOUS | 11 refills | Status: DC
  Filled 2024-01-18: qty 30, 60d supply, fill #0

## 2024-01-18 MED ORDER — ATORVASTATIN CALCIUM 40 MG PO TABS
40.0000 mg | ORAL_TABLET | Freq: Every day | ORAL | 1 refills | Status: DC
  Filled 2024-01-18: qty 90, 90d supply, fill #0

## 2024-01-18 NOTE — Patient Instructions (Signed)
 VISIT SUMMARY:  During today's visit, we discussed your ongoing leg pain and tingling, which is due to nerve compression at the site of your previous kyphoplasty at L2. We also addressed your fatigue, overeating, smoking habits, high blood sugar levels, and mental health concerns. Additionally, we talked about the need for a colon cancer screening.  YOUR PLAN:  -METASTATIC CANCER: Metastatic cancer is cancer that has spread from its original site to other parts of the body. You are currently undergoing chemotherapy and experiencing neuropathy and leg pain due to nerve compression at L2. You are scheduled for a laminectomy on February 06, 2024, to relieve this compression. Continue your current chemotherapy regimen and gabapentin for neuropathy.  -DIABETES MELLITUS: Diabetes mellitus is a condition where your blood sugar levels are too high. Your A1c has increased from 8.4 to 10.5 due to inconsistent insulin dosing. You have agreed to resume taking 50 units of insulin daily. We will check your blood sugar log in 1 month.  -DEPRESSION AND PTSD: Depression and PTSD are mental health conditions that can affect your mood and behavior. You have reported having bad days and have a history of these conditions. We will refer you to a mental health specialist for further evaluation and management.  -SMOKING: Smoking is harmful to your health and can worsen other medical conditions. You have reduced your smoking frequency and are currently on Wellbutrin, which may be helping with cravings. Continue your efforts to quit smoking.  -COLON CANCER SCREENING: Colon cancer screening is important for detecting cancer early. You lost your previous Cologuard kit during a move and have a family history of upper GI cancer. We will refer you for a colonoscopy for direct visualization.  INSTRUCTIONS:  Please follow up with your current provider in 3 months and with the pharmacist in 1 month to review your blood sugar log.  Your laminectomy is scheduled for February 06, 2024. Additionally, you will be referred to a mental health specialist and for a colonoscopy.

## 2024-01-18 NOTE — Telephone Encounter (Signed)
 Pt sent MyChart message notifying of MS Contin backorder, confirmed with pharmacy. Lowella Bandy, NP, changed therapy to Oxycontin. Spoke with pt to inform of changes, verbalized understanding. No further needs at this time.

## 2024-01-18 NOTE — Progress Notes (Signed)
 Subjective:  Patient ID: Samuel [name], male    DOB: 04-Nov-1966  Age: 58 y.o. MRN: [id number]  CC: Medical Management of Chronic Issues (Needs referral to BH/Upcoming surgery)   HPI: Samuel [name] is a 58 y.o. 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   Discussed the use of AI scribe software for clinical note transcription with the patient, who gave verbal consent to proceed.  History of Present Illness He presents with leg pain and tingling. He reports that the discomfort is due to nerve compression at the site of a previous kyphoplasty at L2.  He is scheduled for L2 laminectomy next month by neurosurgery.   The patient also reports experiencing fatigue and overeating, and continues to smoke, albeit less than before.  Tolerating his chemotherapy sessions and had a visit with oncology earlier this month.  Imaging revealed no new or progressive metastatic disease in thoracic and lumbar spine.   He has a history of major depression and PTSD, and is seeking a referral for mental health services.  The patient's blood sugar levels have been high with an A1c of 10.5 up from 8.4 previously, and he was supposed to take 50 units of Lantus daily but he thought this was too much for him and decreased this to 40 units instead and on some occasions would administer 20 units twice daily and forget the second dose on some occasions. He continues to complain of a periumbilical hardness which is not tender and previous CT from 08/2023 was unrevealing.  The patient is also due for a colon cancer screening.    Past Medical History:  Diagnosis Date   Alcohol use disorder,  severe, dependence (HCC) 12/26/2016   Diabetes mellitus without complication (HCC)    Metastatic cancer to brain (HCC) 08/05/2022   Metastatic cancer to spine (HCC) 07/19/2022   Pancreatitis unk   Primary lung adenocarcinoma (HCC) 08/05/2022   Suicidal ideation    Type 2 diabetes mellitus with hyperglycemia, without long-term current use of insulin (HCC) 06/13/2022   Formatting of this note might be different from the original. 06/13/2022 A1C 13.8, FSBG 414 Start empagliflozin 5mg /metformin 1000 daily, levemir 20 qhs. (Samples given) Will apply for medassist for pharmacy    Past Surgical History:  Procedure Laterality Date   IR BONE TUMOR(S)RF ABLATION  08/01/2022   IR IMAGING GUIDED PORT INSERTION  09/21/2022   IR KYPHO LUMBAR INC FX REDUCE BONE BX UNI/BIL CANNULATION INC/IMAGING  08/01/2022    No family history on file.  Social History   Socioeconomic History   Marital status: Single    Spouse name: Not on file   Number of children: Not on file   Years of education: Not on file   Highest education level: Associate degree: academic program  Occupational History   Not on file  Tobacco Use   Smoking status: Every Day    Current packs/day: 0.00    Types: Cigarettes    Last attempt to quit: 08/17/2022    Years since quitting: 1.4   Smokeless tobacco: Never  Vaping Use   Vaping status: Never Used  Substance and Sexual Activity   Alcohol use: Yes    Alcohol/week: 50.0 standard  drinks of alcohol    Types: 50 Cans of beer per week    Comment: last thursday - a couple of beers   Drug use: No   Sexual activity: Yes  Other Topics Concern   Not on file  Social History Narrative   Not on file   Social Drivers of Health   Financial Resource Strain: Medium Risk (03/17/2023)   Overall Financial Resource Strain (CARDIA)    Difficulty of Paying Living Expenses: Somewhat hard  Food Insecurity: No Food Insecurity (12/27/2023)   Hunger Vital Sign    Worried About Running Out of Food in  the Last Year: Never true    Ran Out of Food in the Last Year: Never true  Transportation Needs: No Transportation Needs (12/27/2023)   PRAPARE - Administrator, Civil Service (Medical): No    Lack of Transportation (Non-Medical): No  Physical Activity: Insufficiently Active (03/17/2023)   Exercise Vital Sign    Days of Exercise per Week: 2 days    Minutes of Exercise per Session: 20 min  Stress: Stress Concern Present (03/17/2023)   [employer] of Occupational Health - Occupational Stress Questionnaire    Feeling of Stress : To some extent  Social Connections: Unknown (05/09/2023)   Received from [place], Henriette Combs   Social Connections    How often do you feel lonely or isolated from those around you? (Adult - for ages 70 years and over): Not on file  Recent Concern: Social Connections - Moderately Isolated (03/17/2023)   Social Connection and Isolation Panel [NHANES]    Frequency of Communication with Friends and Family: More than three times a week    Frequency of Social Gatherings with Friends and Family: More than three times a week    Attends Religious Services: More than 4 times per year    Active Member of [employer] or Organizations: No    Attends Engineer, structural: Not on file    Marital Status: Divorced    Allergies  Allergen Reactions   Penicillins Hives   Percocet [Oxycodone-Acetaminophen] Nausea Only and Other (See Comments)    Pt states the pill is hard to swallow. States he was told it may be the coating on the tablet that he is allergic to.     Outpatient Medications Prior to Visit  Medication Sig Dispense Refill   Accu-Chek Softclix Lancets lancets Use to check blood sugar 3 times daily. E11.69 100 each 6   acetaminophen (TYLENOL) 500 MG tablet Take 1,000 mg by mouth in the morning and at bedtime.     folic acid (FOLVITE) 1 MG tablet Take 1 tablet (1 mg total) by mouth daily. 30 tablet 4   glucose blood (ACCU-CHEK GUIDE TEST) test strip  Use as instructed twice daily 100 each 12   glucose blood (ACCU-CHEK GUIDE) test strip Use to check blood sugar 3 times daily. E11.69 100 each 6   Insulin Pen Needle 32G X 4 MM MISC Use with Insulin pen 100 each 3   morphine (MS CONTIN) 15 MG 12 hr tablet Take 1 tablet (15 mg total) by mouth every 12 (twelve) hours. 60 tablet 0   Oxycodone HCl 10 MG TABS Take 1 tablet (10 mg) by mouth every 6 hours as needed for severe pain. 60 tablet 0   tadalafil (CIALIS) 10 MG tablet Take 1 tablet (10 mg total) by mouth every other day as needed for erectile dysfunction. 10 tablet 2   atorvastatin (LIPITOR) 40 MG tablet Take  1 tablet (40 mg total) by mouth daily. 90 tablet 1   buPROPion (WELLBUTRIN XL) 150 MG 24 hr tablet Take 1 tablet (150 mg total) by mouth daily. 90 tablet 1   empagliflozin (JARDIANCE) 25 MG TABS tablet Take 1 tablet (25 mg total) by mouth daily before breakfast. 90 tablet 1   gabapentin (NEURONTIN) 300 MG capsule Take 2 capsules (600 mg total) by mouth 3 (three) times daily. 180 capsule 1   insulin glargine (LANTUS SOLOSTAR) 100 UNIT/ML Solostar Pen Inject 50 Units into the skin daily. 15 mL 11   No facility-administered medications prior to visit.     ROS Review of Systems  Constitutional:  Positive for fatigue. Negative for activity change and appetite change.  HENT:  Negative for sinus pressure and sore throat.   Respiratory:  Negative for chest tightness, shortness of breath and wheezing.   Cardiovascular:  Negative for chest pain and palpitations.  Gastrointestinal:  Negative for abdominal distention, abdominal pain and constipation.  Genitourinary: Negative.   Musculoskeletal:        See HPI  Psychiatric/Behavioral:  Negative for behavioral problems and dysphoric mood.     Objective:  BP 135/80   Pulse 81   Ht 5\' 8"  (1.727 m)   Wt 152 lb (68.9 kg)   SpO2 100%   BMI 23.11 kg/m      01/18/2024    8:47 AM 01/08/2024    8:02 AM 12/18/2023   11:25 AM  BP/Weight   Systolic BP 135 120 139  Diastolic BP 80 69 84  Wt. (Lbs) 152 155.1   BMI 23.11 kg/m2 23.58 kg/m2       Physical Exam Constitutional:      Appearance: He is well-developed.  Cardiovascular:     Rate and Rhythm: Normal rate.     Heart sounds: Normal heart sounds. No murmur heard. Pulmonary:     Effort: Pulmonary effort is normal.     Breath sounds: Normal breath sounds. No wheezing or rales.  Chest:     Chest wall: No tenderness.  Abdominal:     General: Bowel sounds are normal. There is no distension.     Palpations: Abdomen is soft. There is mass (hard periumbilical non tender lumps).     Tenderness: There is no abdominal tenderness.  Musculoskeletal:     Right lower leg: No edema.     Left lower leg: No edema.     Comments: Limited range of motion of lumbar spine with associated tenderness  Neurological:     Mental Status: He is alert and oriented to person, place, and time.  Psychiatric:        Mood and Affect: Mood normal.        Latest Ref Rng & Units 01/08/2024    7:39 AM 12/18/2023    8:34 AM 11/27/2023    8:04 AM  CMP  Glucose 70 - 99 mg/dL 130  865  784   BUN 6 - 20 mg/dL 13  13  14    Creatinine 0.61 - 1.24 mg/dL 6.96  2.95  2.84   Sodium 135 - 145 mmol/L 133  140  140   Potassium 3.5 - 5.1 mmol/L 3.8  4.0  3.6   Chloride 98 - 111 mmol/L 102  106  110   CO2 22 - 32 mmol/L 25  28  25    Calcium 8.9 - 10.3 mg/dL 8.7  9.5  8.7   Total Protein 6.5 - 8.1 g/dL 6.9  7.2  6.7   Total Bilirubin 0.0 - 1.2 mg/dL 0.3  0.4  0.4   Alkaline Phos 38 - 126 U/L 75  65  59   AST 15 - 41 U/L 21  25  29    ALT 0 - 44 U/L 24  33  36     Lipid Panel     Component Value Date/Time   CHOL 133 07/12/2023 1116   TRIG 64 07/12/2023 1116   HDL 41 07/12/2023 1116   CHOLHDL 3.2 07/12/2023 1116   CHOLHDL 2.4 03/11/2021 1843   VLDL 23 03/11/2021 1843   LDLCALC 79 07/12/2023 1116    CBC    Component Value Date/Time   WBC 6.5 01/08/2024 0739   WBC 5.3 08/29/2023 0354   RBC  3.97 (L) 01/08/2024 0739   HGB 13.4 01/08/2024 0739   HGB 14.7 12/24/2020 1144   HCT 38.9 (L) 01/08/2024 0739   HCT 44.3 12/24/2020 1144   PLT 237 01/08/2024 0739   PLT 201 12/24/2020 1144   MCV 98.0 01/08/2024 0739   MCV 94 12/24/2020 1144   MCV 96 08/02/2014 1649   MCH 33.8 01/08/2024 0739   MCHC 34.4 01/08/2024 0739   RDW 11.9 01/08/2024 0739   RDW 12.3 12/24/2020 1144   RDW 14.3 08/02/2014 1649   LYMPHSABS 1.0 01/08/2024 0739   LYMPHSABS 2.5 12/24/2020 1144   LYMPHSABS 1.6 09/14/2012 0541   MONOABS 0.7 01/08/2024 0739   MONOABS 1.0 09/14/2012 0541   EOSABS 0.1 01/08/2024 0739   EOSABS 0.1 12/24/2020 1144   EOSABS 0.0 09/14/2012 0541   BASOSABS 0.0 01/08/2024 0739   BASOSABS 0.0 12/24/2020 1144   BASOSABS 0.0 09/14/2012 0541    Lab Results  Component Value Date   HGBA1C 10.5 (A) 01/18/2024    Assessment & Plan:   Assessment & Plan Metastatic Lung Cancer/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 Kyphoplasty performed at L2. Scheduled for laminectomy at L2 on February 06, 2024 -Continue current chemotherapy regimen. -Continue gabapentin for neuropathy.  Type [name] Diabetes Mellitus A1c increased from 8.4 to 10.5 due to inconsistent insulin dosing. Patient has agreed to resume 50 units of insulin daily. -Resume insulin 50 units daily. -Check blood sugar log in 1 month clinical pharmacy.  Depression and PTSD Patient reports having bad days and a previous diagnosis of major depression and PTSD. -Refer to mental health specialist for further evaluation and management.  Smoking Patient continues to smoke but reports a decrease in frequency. Currently on Wellbutrin which may be helping with cravings. -Encourage continued smoking cessation efforts.  Periumbilical mass -Noted on exam but imaging unrevealing -He was scheduled by oncology for repeat CT abdomen last month which was never completed -Advised  to complete CT scan -I am referring him for colonoscopy  Colon Cancer Screening Patient lost previous Cologuard kit during a move. Family history of upper GI cancer. -Refer for colonoscopy for direct visualization.  Follow-up in 3 months with current provider and in 1 month with pharmacist to review blood sugar log.      Meds ordered this encounter  Medications   atorvastatin (LIPITOR) 40 MG tablet    Sig: Take 1 tablet (40 mg total) by mouth daily.    Dispense:  90 tablet    Refill:  1   buPROPion (WELLBUTRIN XL) 150 MG 24 hr tablet    Sig: Take 1 tablet (150 mg total) by mouth daily.    Dispense:  90 tablet  Refill:  1   empagliflozin (JARDIANCE) 25 MG TABS tablet    Sig: Take 1 tablet (25 mg total) by mouth daily before breakfast.    Dispense:  90 tablet    Refill:  1   gabapentin (NEURONTIN) 300 MG capsule    Sig: Take 2 capsules (600 mg total) by mouth 3 (three) times daily.    Dispense:  180 capsule    Refill:  1   insulin glargine (LANTUS SOLOSTAR) 100 UNIT/ML Solostar Pen    Sig: Inject 50 Units into the skin daily.    Dispense:  30 mL    Refill:  11    Dose change    Follow-up: Return in about 1 month (around 02/15/2024) for Blood sugar evaluation with Franky Macho, Medical conditions with PCP, in 3 months.       Hoy Register, MD, FAAFP. [facility] [place], [place] [phone]   01/18/2024, 9:33 AM

## 2024-01-24 ENCOUNTER — Other Ambulatory Visit: Payer: Self-pay | Admitting: Nurse Practitioner

## 2024-01-24 DIAGNOSIS — M792 Neuralgia and neuritis, unspecified: Secondary | ICD-10-CM

## 2024-01-24 DIAGNOSIS — C3490 Malignant neoplasm of unspecified part of unspecified bronchus or lung: Secondary | ICD-10-CM

## 2024-01-24 DIAGNOSIS — Z515 Encounter for palliative care: Secondary | ICD-10-CM

## 2024-01-24 DIAGNOSIS — C7951 Secondary malignant neoplasm of bone: Secondary | ICD-10-CM

## 2024-01-24 DIAGNOSIS — G893 Neoplasm related pain (acute) (chronic): Secondary | ICD-10-CM

## 2024-01-25 ENCOUNTER — Other Ambulatory Visit (HOSPITAL_COMMUNITY): Payer: Self-pay

## 2024-01-25 MED ORDER — OXYCODONE HCL 10 MG PO TABS
10.0000 mg | ORAL_TABLET | Freq: Four times a day (QID) | ORAL | 0 refills | Status: DC | PRN
  Filled 2024-01-25: qty 60, 15d supply, fill #0

## 2024-01-29 ENCOUNTER — Inpatient Hospital Stay: Payer: Medicaid Other

## 2024-01-29 ENCOUNTER — Inpatient Hospital Stay: Payer: Medicaid Other | Attending: Adult Health | Admitting: Internal Medicine

## 2024-01-29 VITALS — BP 115/74 | HR 84 | Temp 98.1°F | Resp 17 | Ht 68.0 in | Wt 162.2 lb

## 2024-01-29 DIAGNOSIS — I700 Atherosclerosis of aorta: Secondary | ICD-10-CM | POA: Insufficient documentation

## 2024-01-29 DIAGNOSIS — M48061 Spinal stenosis, lumbar region without neurogenic claudication: Secondary | ICD-10-CM | POA: Diagnosis not present

## 2024-01-29 DIAGNOSIS — C7931 Secondary malignant neoplasm of brain: Secondary | ICD-10-CM

## 2024-01-29 DIAGNOSIS — F1029 Alcohol dependence with unspecified alcohol-induced disorder: Secondary | ICD-10-CM | POA: Insufficient documentation

## 2024-01-29 DIAGNOSIS — C3490 Malignant neoplasm of unspecified part of unspecified bronchus or lung: Secondary | ICD-10-CM

## 2024-01-29 DIAGNOSIS — C3491 Malignant neoplasm of unspecified part of right bronchus or lung: Secondary | ICD-10-CM

## 2024-01-29 DIAGNOSIS — J439 Emphysema, unspecified: Secondary | ICD-10-CM | POA: Insufficient documentation

## 2024-01-29 DIAGNOSIS — G893 Neoplasm related pain (acute) (chronic): Secondary | ICD-10-CM | POA: Diagnosis not present

## 2024-01-29 DIAGNOSIS — M47816 Spondylosis without myelopathy or radiculopathy, lumbar region: Secondary | ICD-10-CM | POA: Insufficient documentation

## 2024-01-29 DIAGNOSIS — Z5111 Encounter for antineoplastic chemotherapy: Secondary | ICD-10-CM | POA: Insufficient documentation

## 2024-01-29 DIAGNOSIS — F17210 Nicotine dependence, cigarettes, uncomplicated: Secondary | ICD-10-CM | POA: Insufficient documentation

## 2024-01-29 DIAGNOSIS — C7951 Secondary malignant neoplasm of bone: Secondary | ICD-10-CM | POA: Insufficient documentation

## 2024-01-29 DIAGNOSIS — Z5112 Encounter for antineoplastic immunotherapy: Secondary | ICD-10-CM | POA: Insufficient documentation

## 2024-01-29 DIAGNOSIS — Z7984 Long term (current) use of oral hypoglycemic drugs: Secondary | ICD-10-CM | POA: Insufficient documentation

## 2024-01-29 DIAGNOSIS — J309 Allergic rhinitis, unspecified: Secondary | ICD-10-CM | POA: Insufficient documentation

## 2024-01-29 DIAGNOSIS — M51369 Other intervertebral disc degeneration, lumbar region without mention of lumbar back pain or lower extremity pain: Secondary | ICD-10-CM | POA: Diagnosis not present

## 2024-01-29 DIAGNOSIS — E119 Type 2 diabetes mellitus without complications: Secondary | ICD-10-CM | POA: Insufficient documentation

## 2024-01-29 DIAGNOSIS — Z79899 Other long term (current) drug therapy: Secondary | ICD-10-CM | POA: Insufficient documentation

## 2024-01-29 DIAGNOSIS — C3411 Malignant neoplasm of upper lobe, right bronchus or lung: Secondary | ICD-10-CM | POA: Insufficient documentation

## 2024-01-29 LAB — CBC WITH DIFFERENTIAL (CANCER CENTER ONLY)
Abs Immature Granulocytes: 0.02 10*3/uL (ref 0.00–0.07)
Basophils Absolute: 0 10*3/uL (ref 0.0–0.1)
Basophils Relative: 0 %
Eosinophils Absolute: 0.1 10*3/uL (ref 0.0–0.5)
Eosinophils Relative: 2 %
HCT: 38.3 % — ABNORMAL LOW (ref 39.0–52.0)
Hemoglobin: 12.6 g/dL — ABNORMAL LOW (ref 13.0–17.0)
Immature Granulocytes: 0 %
Lymphocytes Relative: 15 %
Lymphs Abs: 1.1 10*3/uL (ref 0.7–4.0)
MCH: 32.5 pg (ref 26.0–34.0)
MCHC: 32.9 g/dL (ref 30.0–36.0)
MCV: 98.7 fL (ref 80.0–100.0)
Monocytes Absolute: 0.7 10*3/uL (ref 0.1–1.0)
Monocytes Relative: 10 %
Neutro Abs: 5.5 10*3/uL (ref 1.7–7.7)
Neutrophils Relative %: 73 %
Platelet Count: 220 10*3/uL (ref 150–400)
RBC: 3.88 MIL/uL — ABNORMAL LOW (ref 4.22–5.81)
RDW: 13.2 % (ref 11.5–15.5)
WBC Count: 7.5 10*3/uL (ref 4.0–10.5)
nRBC: 0 % (ref 0.0–0.2)

## 2024-01-29 LAB — CMP (CANCER CENTER ONLY)
ALT: 36 U/L (ref 0–44)
AST: 25 U/L (ref 15–41)
Albumin: 3.6 g/dL (ref 3.5–5.0)
Alkaline Phosphatase: 82 U/L (ref 38–126)
Anion gap: 4 — ABNORMAL LOW (ref 5–15)
BUN: 12 mg/dL (ref 6–20)
CO2: 26 mmol/L (ref 22–32)
Calcium: 8.3 mg/dL — ABNORMAL LOW (ref 8.9–10.3)
Chloride: 105 mmol/L (ref 98–111)
Creatinine: 0.62 mg/dL (ref 0.61–1.24)
GFR, Estimated: >60 mL/min (ref >60)
Glucose, Bld: 327 mg/dL — ABNORMAL HIGH (ref 70–99)
Potassium: 4 mmol/L (ref 3.5–5.1)
Sodium: 135 mmol/L (ref 135–145)
Total Bilirubin: 0.2 mg/dL (ref 0.0–1.2)
Total Protein: 6.9 g/dL (ref 6.5–8.1)

## 2024-01-29 MED ORDER — SODIUM CHLORIDE 0.9 % IV SOLN
500.0000 mg/m2 | Freq: Once | INTRAVENOUS | Status: AC
  Administered 2024-01-29: 900 mg via INTRAVENOUS
  Filled 2024-01-29: qty 20

## 2024-01-29 MED ORDER — HEPARIN SOD (PORK) LOCK FLUSH 100 UNIT/ML IV SOLN
500.0000 [IU] | Freq: Once | INTRAVENOUS | Status: AC | PRN
  Administered 2024-01-29: 500 [IU]

## 2024-01-29 MED ORDER — SODIUM CHLORIDE 0.9% FLUSH
10.0000 mL | Freq: Once | INTRAVENOUS | Status: AC
  Administered 2024-01-29: 10 mL

## 2024-01-29 MED ORDER — SODIUM CHLORIDE 0.9 % IV SOLN
200.0000 mg | Freq: Once | INTRAVENOUS | Status: AC
  Administered 2024-01-29: 200 mg via INTRAVENOUS
  Filled 2024-01-29: qty 200

## 2024-01-29 MED ORDER — SODIUM CHLORIDE 0.9 % IV SOLN
Freq: Once | INTRAVENOUS | Status: AC
Start: 2024-01-29 — End: 2024-01-29

## 2024-01-29 MED ORDER — PROCHLORPERAZINE MALEATE 10 MG PO TABS
10.0000 mg | ORAL_TABLET | Freq: Once | ORAL | Status: AC
  Administered 2024-01-29: 10 mg via ORAL
  Filled 2024-01-29: qty 1

## 2024-01-29 MED ORDER — SODIUM CHLORIDE 0.9% FLUSH
10.0000 mL | INTRAVENOUS | Status: DC | PRN
  Administered 2024-01-29: 10 mL

## 2024-01-29 NOTE — Patient Instructions (Signed)
 [facility] - A DEPT OF [facility]  Discharge Instructions: Thank you for choosing [facility] to provide your oncology and hematology care.   If you have a lab appointment with the Cancer Center, please go directly to the Cancer Center and check in at the registration area.   Wear comfortable clothing and clothing appropriate for easy access to any Portacath or PICC line.   We strive to give you quality time with your provider. You may need to reschedule your appointment if you arrive late (15 or more minutes).  Arriving late affects you and other patients whose appointments are after yours.  Also, if you miss three or more appointments without notifying the office, you may be dismissed from the clinic at the provider's discretion.      For prescription refill requests, have your pharmacy contact our office and allow 72 hours for refills to be completed.    Today you received the following chemotherapy and/or immunotherapy agents: Keytruda, Alimta.       To help prevent nausea and vomiting after your treatment, we encourage you to take your nausea medication as directed.  BELOW ARE SYMPTOMS THAT SHOULD BE REPORTED IMMEDIATELY: *FEVER GREATER THAN 100.4 F (38 C) OR HIGHER *CHILLS OR SWEATING *NAUSEA AND VOMITING THAT IS NOT CONTROLLED WITH YOUR NAUSEA MEDICATION *UNUSUAL SHORTNESS OF BREATH *UNUSUAL BRUISING OR BLEEDING *URINARY PROBLEMS (pain or burning when urinating, or frequent urination) *BOWEL PROBLEMS (unusual diarrhea, constipation, pain near the anus) TENDERNESS IN MOUTH AND THROAT WITH OR WITHOUT PRESENCE OF ULCERS (sore throat, sores in mouth, or a toothache) UNUSUAL RASH, SWELLING OR PAIN  UNUSUAL VAGINAL DISCHARGE OR ITCHING   Items with * indicate a potential emergency and should be followed up as soon as possible or go to the Emergency Department if any problems should occur.  Please show the CHEMOTHERAPY ALERT CARD or  IMMUNOTHERAPY ALERT CARD at check-in to the Emergency Department and triage nurse.  Should you have questions after your visit or need to cancel or reschedule your appointment, please contact [facility] - A DEPT OF [facility]  Dept: [phone]  and follow the prompts.  Office hours are 8:00 a.m. to 4:30 p.m. Monday - Friday. Please note that voicemails left after 4:00 p.m. may not be returned until the following business day.  We are closed weekends and major holidays. You have access to a nurse at all times for urgent questions. Please call the main number to the clinic Dept: [phone] and follow the prompts.   For any non-urgent questions, you may also contact your provider using MyChart. We now offer e-Visits for anyone 61 and older to request care online for non-urgent symptoms. For details visit [email or web address].   Also download the MyChart app! Go to the app store, search "MyChart", open the app, select , and log in with your MyChart username and password.

## 2024-01-29 NOTE — Progress Notes (Signed)
 Surgical Instructions   Your procedure is scheduled on Tuesday February 06, 2024. Report to [facility] Main Entrance "A" at 11:20 A.M., then check in with the Admitting office. Any questions or running late day of surgery: call [phone]  Questions prior to your surgery date: call [phone], Monday-Friday, 8am-4pm. If you experience any cold or flu symptoms such as cough, fever, chills, shortness of breath, etc. between now and your scheduled surgery, please notify us at the above number.     Remember:  Do not eat or drink after midnight the night before your surgery   Take these medicines the morning of surgery with A SIP OF WATER  atorvastatin (LIPITOR)  buPROPion (WELLBUTRIN XL)  gabapentin (NEURONTIN)  oxyCODONE (OXYCONTIN)   May take these medicines IF NEEDED: acetaminophen (TYLENOL)  Oxycodone HCl   One week prior to surgery, STOP taking any Aspirin (unless otherwise instructed by your surgeon) Aleve, Naproxen, Ibuprofen, Motrin, Advil, Goody's, BC's, all herbal medications, fish oil, and non-prescription vitamins.   WHAT DO I DO ABOUT MY DIABETES MEDICATION?   Do not take oral diabetes medicines (pills) the morning of surgery.        DO NOT TAKE YOUR empagliflozin (JARDIANCE) 72 HOURS PRIOR TO SURGERY, WITH THE LAST DOSE BEING 02/02/2024.     USE 25 UNITS OF YOUR insulin glargine (LANTUS SOLOSTAR), WHICH IS 50% OF YOUR REGULAR DOSE.        The day of surgery, do not take other diabetes injectables, including Byetta (exenatide), Bydureon (exenatide ER), Victoza (liraglutide), or Trulicity (dulaglutide).  If your CBG is greater than 220 mg/dL, you may take  of your sliding scale (correction) dose of insulin.   HOW TO MANAGE YOUR DIABETES BEFORE AND AFTER SURGERY  Why is it important to control my blood sugar before and after surgery? Improving blood sugar levels before and after surgery helps healing and can limit problems. A way of improving blood sugar control  is eating a healthy diet by:  Eating less sugar and carbohydrates  Increasing activity/exercise  Talking with your doctor about reaching your blood sugar goals High blood sugars (greater than 180 mg/dL) can raise your risk of infections and slow your recovery, so you will need to focus on controlling your diabetes during the weeks before surgery. Make sure that the doctor who takes care of your diabetes knows about your planned surgery including the date and location.  How do I manage my blood sugar before surgery? Check your blood sugar at least 4 times a day, starting 2 days before surgery, to make sure that the level is not too high or low.  Check your blood sugar the morning of your surgery when you wake up and every 2 hours until you get to the Short Stay unit.  If your blood sugar is less than 70 mg/dL, you will need to treat for low blood sugar: Do not take insulin. Treat a low blood sugar (less than 70 mg/dL) with  cup of clear juice (cranberry or apple), 4 glucose tablets, OR glucose gel. Recheck blood sugar in 15 minutes after treatment (to make sure it is greater than 70 mg/dL). If your blood sugar is not greater than 70 mg/dL on recheck, call [phone] for further instructions. Report your blood sugar to the short stay nurse when you get to Short Stay.  If you are admitted to the hospital after surgery: Your blood sugar will be checked by the staff and you will probably be given insulin after surgery (  instead of oral diabetes medicines) to make sure you have good blood sugar levels. The goal for blood sugar control after surgery is 80-180 mg/dL.                      Do NOT Smoke (Tobacco/Vaping) for 24 hours prior to your procedure.  If you use a CPAP at night, you may bring your mask/headgear for your overnight stay.   You will be asked to remove any contacts, glasses, piercing's, hearing aid's, dentures/partials prior to surgery. Please bring cases for these items if  needed.    Patients discharged the day of surgery will not be allowed to drive home, and someone needs to stay with them for 24 hours.  SURGICAL WAITING ROOM VISITATION Patients may have no more than 2 support people in the waiting area - these visitors may rotate.   Pre-op nurse will coordinate an appropriate time for 1 ADULT support person, who may not rotate, to accompany patient in pre-op.  Children under the age of 5 must have an adult with them who is not the patient and must remain in the main waiting area with an adult.  If the patient needs to stay at the hospital during part of their recovery, the visitor guidelines for inpatient rooms apply.  Please refer to the [facility] website for the visitor guidelines for any additional information.   If you received a COVID test during your pre-op visit  it is requested that you wear a mask when out in public, stay away from anyone that may not be feeling well and notify your surgeon if you develop symptoms. If you have been in contact with anyone that has tested positive in the last 10 days please notify you surgeon.      Pre-operative 5 CHG Bathing Instructions   You can play a key role in reducing the risk of infection after surgery. Your skin needs to be as free of germs as possible. You can reduce the number of germs on your skin by washing with CHG (chlorhexidine gluconate) soap before surgery. CHG is an antiseptic soap that kills germs and continues to kill germs even after washing.   DO NOT use if you have an allergy to chlorhexidine/CHG or antibacterial soaps. If your skin becomes reddened or irritated, stop using the CHG and notify one of our RNs at [phone].   Please shower with the CHG soap starting 4 days before surgery using the following schedule:     Please keep in mind the following:  DO NOT shave, including legs and underarms, starting the day of your first shower.   You may shave your face at any point  before/day of surgery.  Place clean sheets on your bed the day you start using CHG soap. Use a clean washcloth (not used since being washed) for each shower. DO NOT sleep with pets once you start using the CHG.   CHG Shower Instructions:  Wash your face and private area with normal soap. If you choose to wash your hair, wash first with your normal shampoo.  After you use shampoo/soap, rinse your hair and body thoroughly to remove shampoo/soap residue.  Turn the water OFF and apply about 3 tablespoons (45 ml) of CHG soap to a CLEAN washcloth.  Apply CHG soap ONLY FROM YOUR NECK DOWN TO YOUR TOES (washing for 3-5 minutes)  DO NOT use CHG soap on face, private areas, open wounds, or sores.  Pay special attention to  the area where your surgery is being performed.  If you are having back surgery, having someone wash your back for you may be helpful. Wait 2 minutes after CHG soap is applied, then you may rinse off the CHG soap.  Pat dry with a clean towel  Put on clean clothes/pajamas   If you choose to wear lotion, please use ONLY the CHG-compatible lotions that are listed below.  Additional instructions for the day of surgery: DO NOT APPLY any lotions, deodorants or cologne.    Do not bring valuables to the hospital. [facility] is not responsible for any belongings/valuables. Do not wear jewelry  Put on clean/comfortable clothes.  Please brush your teeth.  Ask your nurse before applying any prescription medications to the skin.     CHG Compatible Lotions   Aveeno Moisturizing lotion  Cetaphil Moisturizing Cream  Cetaphil Moisturizing Lotion  Clairol Herbal Essence Moisturizing Lotion, Dry Skin  Clairol Herbal Essence Moisturizing Lotion, Extra Dry Skin  Clairol Herbal Essence Moisturizing Lotion, Normal Skin  Curel Age Defying Therapeutic Moisturizing Lotion with Alpha Hydroxy  Curel Extreme Care Body Lotion  Curel Soothing Hands Moisturizing Hand Lotion  Curel Therapeutic  Moisturizing Cream, Fragrance-Free  Curel Therapeutic Moisturizing Lotion, Fragrance-Free  Curel Therapeutic Moisturizing Lotion, Original Formula  Eucerin Daily Replenishing Lotion  Eucerin Dry Skin Therapy Plus Alpha Hydroxy Crme  Eucerin Dry Skin Therapy Plus Alpha Hydroxy Lotion  Eucerin Original Crme  Eucerin Original Lotion  Eucerin Plus Crme Eucerin Plus Lotion  Eucerin TriLipid Replenishing Lotion  Keri Anti-Bacterial Hand Lotion  Keri Deep Conditioning Original Lotion Dry Skin Formula Softly Scented  Keri Deep Conditioning Original Lotion, Fragrance Free Sensitive Skin Formula  Keri Lotion Fast Absorbing Fragrance Free Sensitive Skin Formula  Keri Lotion Fast Absorbing Softly Scented Dry Skin Formula  Keri Original Lotion  Keri Skin Renewal Lotion Keri Silky Smooth Lotion  Keri Silky Smooth Sensitive Skin Lotion  Nivea Body Creamy Conditioning Oil  Nivea Body Extra Enriched Lotion  Nivea Body Original Lotion  Nivea Body Sheer Moisturizing Lotion Nivea Crme  Nivea Skin Firming Lotion  NutraDerm 30 Skin Lotion  NutraDerm Skin Lotion  NutraDerm Therapeutic Skin Cream  NutraDerm Therapeutic Skin Lotion  ProShield Protective Hand Cream  Provon moisturizing lotion  Please read over the following fact sheets that you were given.

## 2024-01-29 NOTE — Progress Notes (Signed)
 [facility] Telephone:([phone]) [phone]   Fax:([phone]) [phone]  OFFICE PROGRESS NOTE  Hoy Register, MD 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  Biomarker Findings Microsatellite status - MS-Stable Tumor Mutational Burden - 8 Muts/Mb Genomic Findings For a complete list of the genes assayed, please refer to the Appendix. KEAP1 Q227* CDKN2A/B [id number] G89V PRKCI amplification TERC amplification - equivocal? TP53 [id number]*18 8 Disease relevant genes with no reportable alterations: ALK, BRAF, EGFR, ERBB2, KRAS, MET, RET, ROS1  PDL1 TPS  90%  PRIOR THERAPY:  1) Status post palliative radiotherapy to the L2 lesion under the care of Dr. Kathrynn Running. 2) Status post SRS to solitary brain metastasis under the care of Dr. Kathrynn Running  CURRENT THERAPY: Palliative systemic therapy with carboplatin for AUC of 5, Alimta 500 Mg/M2 and Keytruda 200 Mg IV every 3 weeks.  First dose September 05, 2022.  Status post 24 cycles.  Starting from cycle #5 the patient will be on maintenance treatment with Alimta and Keytruda every 3 weeks.  INTERVAL HISTORY: Samuel [name] 58 y.o. male returns to the clinic today for follow-up visit. Discussed the use of AI scribe software for clinical note transcription with the patient, who gave verbal consent to proceed.  History of Present Illness   The patient is a 58 year old with stage four non-small cell lung cancer, adenocarcinoma, who presents for ongoing treatment. He was referred to a spine specialist for surgical evaluation.  He was diagnosed with stage four  non-small cell lung cancer, adenocarcinoma, in September 2023. The tumor has no actionable mutation, but PD-L1 expression was noted to be 90%.  He initially received radiation treatment to the L2 vertebra and currently on Palliative systemic therapy with carboplatin for AUC of 5, Alimta 500 Mg/M2 and Keytruda 200 Mg IV every 3 weeks.  First dose September 05, 2022.  Status post 24 cycles.  Starting from cycle #5 the patient will be on maintenance treatment with Alimta and Keytruda every 3 weeks.. No current symptoms of nausea, vomiting, weight loss, chest pain, shortness of breath, or cough.  The surgery will be performed by the same neurosurgeon who previously treated his brain with radiation.       MEDICAL HISTORY: Past Medical History:  Diagnosis Date   Alcohol use disorder, severe, dependence (HCC) 12/26/2016   Diabetes mellitus without complication (HCC)    Metastatic cancer to brain (HCC) 08/05/2022   Metastatic cancer to spine (HCC) 07/19/2022   Pancreatitis unk   Primary lung adenocarcinoma (HCC) 08/05/2022   Suicidal ideation    Type 2 diabetes mellitus with hyperglycemia, without long-term current use of insulin (HCC) 06/13/2022   Formatting of this note might be different from the original. 06/13/2022 A1C 13.8, FSBG 414 Start empagliflozin 5mg /metformin 1000 daily, levemir 20 qhs. (Samples given) Will apply for medassist for pharmacy    ALLERGIES:  is allergic to penicillins and percocet [oxycodone-acetaminophen].  MEDICATIONS:  Current Outpatient Medications  Medication Sig Dispense Refill   Accu-Chek Softclix Lancets lancets Use to check blood sugar 3 times daily. E11.69 100 each 6   acetaminophen (TYLENOL) 500 MG tablet Take 1,000 mg by  mouth every 6 (six) hours as needed for moderate pain (pain score 4-6).     atorvastatin (LIPITOR) 40 MG tablet Take 1 tablet (40 mg total) by mouth daily. 90 tablet 1   buPROPion (WELLBUTRIN XL) 150 MG 24 hr tablet Take 1 tablet (150 mg  total) by mouth daily. 90 tablet 1   empagliflozin (JARDIANCE) 25 MG TABS tablet Take 1 tablet (25 mg total) by mouth daily before breakfast. 90 tablet 1   folic acid (FOLVITE) 1 MG tablet Take 1 tablet (1 mg total) by mouth daily. 30 tablet 4   gabapentin (NEURONTIN) 300 MG capsule Take 2 capsules (600 mg total) by mouth 3 (three) times daily. 180 capsule 1   glucose blood (ACCU-CHEK GUIDE TEST) test strip Use as instructed twice daily 100 each 12   glucose blood (ACCU-CHEK GUIDE) test strip Use to check blood sugar 3 times daily. E11.69 100 each 6   insulin glargine (LANTUS SOLOSTAR) 100 UNIT/ML Solostar Pen Inject 50 Units into the skin daily. 30 mL 11   Insulin Pen Needle 32G X 4 MM MISC Use with Insulin pen 100 each 3   oxyCODONE (OXYCONTIN) 10 mg 12 hr tablet Take 1 tablet (10 mg total) by mouth every 12 (twelve) hours. 60 tablet 0   Oxycodone HCl 10 MG TABS Take 1 tablet (10 mg) by mouth every 6 hours as needed for severe pain. 60 tablet 0   tadalafil (CIALIS) 10 MG tablet Take 1 tablet (10 mg total) by mouth every other day as needed for erectile dysfunction. 10 tablet 2   No current facility-administered medications for this visit.    SURGICAL HISTORY:  Past Surgical History:  Procedure Laterality Date   IR BONE TUMOR(S)RF ABLATION  08/01/2022   IR IMAGING GUIDED PORT INSERTION  09/21/2022   IR KYPHO LUMBAR INC FX REDUCE BONE BX UNI/BIL CANNULATION INC/IMAGING  08/01/2022    REVIEW OF SYSTEMS:  A comprehensive review of systems was negative.   PHYSICAL EXAMINATION: General appearance: alert, cooperative, and no distress Head: Normocephalic, without obvious abnormality, atraumatic Neck: no adenopathy, no JVD, supple, symmetrical, trachea midline, and thyroid not enlarged, symmetric, no tenderness/mass/nodules Lymph nodes: Cervical, supraclavicular, and axillary nodes normal. Resp: clear to auscultation bilaterally Back: symmetric, no curvature. ROM normal. No CVA  tenderness. Cardio: regular rate and rhythm, S1, S2 normal, no murmur, click, rub or gallop GI: soft, non-tender; bowel sounds normal; no masses,  no organomegaly Extremities: extremities normal, atraumatic, no cyanosis or edema  ECOG PERFORMANCE STATUS: 1 - Symptomatic but completely ambulatory  Blood pressure 115/74, pulse 84, temperature 98.1 F (36.7 C), temperature source Temporal, resp. rate 17, height 5\' 8"  (1.727 m), weight 162 lb 3.2 oz (73.6 kg), SpO2 100%.  LABORATORY DATA: Lab Results  Component Value Date   WBC 6.5 01/08/2024   HGB 13.4 01/08/2024   HCT 38.9 (L) 01/08/2024   MCV 98.0 01/08/2024   PLT 237 01/08/2024      Chemistry      Component Value Date/Time   NA 133 (L) 01/08/2024 0739   NA 140 12/24/2020 1144   NA 142 08/02/2014 1649   K 3.8 01/08/2024 0739   K 4.2 08/02/2014 1649   CL 102 01/08/2024 0739   CL 109 (H) 08/02/2014 1649   CO2 25 01/08/2024 0739   CO2 21 08/02/2014 1649   BUN 13 01/08/2024 0739   BUN 7 12/24/2020 1144   BUN 11 08/02/2014 1649   CREATININE 0.67 01/08/2024 0739   CREATININE  0.92 08/02/2014 1649      Component Value Date/Time   CALCIUM 8.7 (L) 01/08/2024 0739   CALCIUM 8.6 08/02/2014 1649   ALKPHOS 75 01/08/2024 0739   ALKPHOS 82 08/02/2014 1649   AST 21 01/08/2024 0739   ALT 24 01/08/2024 0739   ALT 132 (H) 08/02/2014 1649   BILITOT 0.3 01/08/2024 0739       RADIOGRAPHIC STUDIES: MR Lumbar Spine W Wo Contrast Result Date: 01/04/2024 CLINICAL DATA:  Metastatic disease evaluation please scan T and L; Metastatic disease evaluation Hx of spinal mets- new numbness and tingling in lower ext EXAM: MRI THORACIC AND LUMBAR SPINE WITHOUT AND WITH CONTRAST TECHNIQUE: Multiplanar and multiecho pulse sequences of the thoracic and lumbar spine were obtained without and with intravenous contrast. CONTRAST:  7mL GADAVIST GADOBUTROL 1 MMOL/ML IV SOLN COMPARISON:  None Available. FINDINGS: MRI THORACIC SPINE FINDINGS Alignment:  No  substantial sagittal subluxation. Vertebrae: STIR hyperintense T3 and T9 lesions are unchanged. No new lesions identified in the thoracic spine. Cord: Normal cord signal.  No abnormal enhancement. Paraspinal and other soft tissues: Unremarkable. Disc levels: No significant canal or foraminal stenosis. MRI LUMBAR SPINE FINDINGS Segmentation: Standard. Alignment:  Similar.  No substantial sagittal subluxation. Vertebrae: Similar L2 metastatic disease with pathologic compression fracture E and advanced height loss. Similar bony retropulsion with similar canal stenosis. Similar extension weighted fatty marrow at L1 and below, due to radiotherapy similar chronic L4 superior endplate deformity. Similar hypodense appearance of the L3 posterior body marrow. No new lesions identified. Conus medullaris: Extends to the L1-L2 level and appears normal. Paraspinal and other soft tissues: No paraspinal mass or inflammation. Disc levels: L1-L2: Canal stenosis from bony retropulsion is unchanged. Mild bilateral foraminal stenosis L2-L3: Canal stenosis or bony retropulsion is unchanged. Similar biforaminal stenosis. L3-L4: Mild disc bulge.  No significant stenosis. L4-L5: Disc bulging in facet arthropathy. Similar moderate left foraminal stenosis. Pain canal and right foramen. L5-S1: Similar mild bilateral foraminal stenosis. Facet arthropathy. IMPRESSION: 1. No new or progressive metastatic disease in the thoracic and lumbar spine. 2. Unchanged L2 compression fracture with bony retropulsion contributing to similar canal and bilateral foraminal stenosis at L2-L3. Electronically Signed   By: Feliberto Harts M.D.   On: 01/04/2024 20:10   MR THORACIC SPINE W WO CONTRAST Result Date: 01/04/2024 CLINICAL DATA:  Metastatic disease evaluation please scan T and L; Metastatic disease evaluation Hx of spinal mets- new numbness and tingling in lower ext EXAM: MRI THORACIC AND LUMBAR SPINE WITHOUT AND WITH CONTRAST TECHNIQUE: Multiplanar  and multiecho pulse sequences of the thoracic and lumbar spine were obtained without and with intravenous contrast. CONTRAST:  7mL GADAVIST GADOBUTROL 1 MMOL/ML IV SOLN COMPARISON:  None Available. FINDINGS: MRI THORACIC SPINE FINDINGS Alignment:  No substantial sagittal subluxation. Vertebrae: STIR hyperintense T3 and T9 lesions are unchanged. No new lesions identified in the thoracic spine. Cord: Normal cord signal.  No abnormal enhancement. Paraspinal and other soft tissues: Unremarkable. Disc levels: No significant canal or foraminal stenosis. MRI LUMBAR SPINE FINDINGS Segmentation: Standard. Alignment:  Similar.  No substantial sagittal subluxation. Vertebrae: Similar L2 metastatic disease with pathologic compression fracture E and advanced height loss. Similar bony retropulsion with similar canal stenosis. Similar extension weighted fatty marrow at L1 and below, due to radiotherapy similar chronic L4 superior endplate deformity. Similar hypodense appearance of the L3 posterior body marrow. No new lesions identified. Conus medullaris: Extends to the L1-L2 level and appears normal. Paraspinal and other soft tissues: No paraspinal mass or inflammation.  Disc levels: L1-L2: Canal stenosis from bony retropulsion is unchanged. Mild bilateral foraminal stenosis L2-L3: Canal stenosis or bony retropulsion is unchanged. Similar biforaminal stenosis. L3-L4: Mild disc bulge.  No significant stenosis. L4-L5: Disc bulging in facet arthropathy. Similar moderate left foraminal stenosis. Pain canal and right foramen. L5-S1: Similar mild bilateral foraminal stenosis. Facet arthropathy. IMPRESSION: 1. No new or progressive metastatic disease in the thoracic and lumbar spine. 2. Unchanged L2 compression fracture with bony retropulsion contributing to similar canal and bilateral foraminal stenosis at L2-L3. Electronically Signed   By: Feliberto Harts M.D.   On: 01/04/2024 20:10   CT CHEST ABDOMEN PELVIS W CONTRAST Result Date:  01/01/2024 CLINICAL DATA:  History of metastatic non-small cell lung cancer. Follow-up * Tracking Code: BO * EXAM: CT CHEST, ABDOMEN, AND PELVIS WITH CONTRAST TECHNIQUE: Multidetector CT imaging of the chest, abdomen and pelvis was performed following the standard protocol during bolus administration of intravenous contrast. RADIATION DOSE REDUCTION: This exam was performed according to the departmental dose-optimization program which includes automated exposure control, adjustment of the mA and/or kV according to patient size and/or use of iterative reconstruction technique. CONTRAST:  OMNIPAQUE IOHEXOL 300 MG/ML  SOLN COMPARISON:  Multiple priors including most recent CT October 10, 2023 and August 28, 2023 FINDINGS: CT CHEST FINDINGS Cardiovascular: Right chest Port-A-Cath with tip at the superior cavoatrial junction. Aortic atherosclerosis. No central pulmonary embolus on this nondedicated study. Normal size heart. No significant pericardial effusion/thickening. Mediastinum/Nodes: No suspicious thyroid nodule. Pathologically enlarged mediastinal, hilar or axillary lymph nodes. Symmetric esophageal wall thickening similar prior. Lungs/Pleura: Biapical pleuroparenchymal scarring. Mild emphysema. No suspicious pulmonary nodules or masses. No pleural effusion. No pneumothorax. Musculoskeletal: Similar appearance of the sclerosis and lucency in the thoracic spine most notably involving the T3 vertebral body level. No new aggressive lytic or blastic lesion of bone. Remote bilateral rib fractures and right clavicular fracture. CT ABDOMEN PELVIS FINDINGS Hepatobiliary: No suspicious hepatic lesion. Stable 4 mm hypodensity in the posterior right hepatic lobe favored benign. Gallbladder is unremarkable. No biliary ductal dilation. Pancreas: No pancreatic ductal dilation or evidence of acute inflammation. Spleen: No splenomegaly or focal splenic lesion. Adrenals/Urinary Tract: Progressive lobular thickening of the  right adrenal gland without discrete nodularity. Left adrenal gland appears normal. No hydronephrosis. Kidneys demonstrate symmetric enhancement. Punctate nonobstructive right renal calculi. Urinary bladder is unremarkable for degree of distension. Stomach/Bowel: Stomach is unremarkable for degree of distension. No pathologic dilation of small or large bowel. No evidence of acute bowel inflammation. Vascular/Lymphatic: Aortic atherosclerosis. Normal caliber abdominal aorta. Smooth IVC contours. The portal, splenic and superior mesenteric veins are patent. No pathologically enlarged abdominal or pelvic lymph nodes. Reproductive: Prostate is unremarkable. Other: Similar nonspecific trace abdominopelvic free fluid. Musculoskeletal: Prior L2 vertebral body augmentation with similar vertebral plana and 7 mm of posterior bony retropulsion. Stable appearance of the mixed sclerotic and lucencies in portions of the lumbar spine most notably involving L3 posteriorly extending into the right posterior elements compatible with metastatic lesions. No new or progressive osseous metastasis identified. IMPRESSION: 1. Stable appearance of the mixed sclerotic and lucencies in portions of the thoracic and lumbar spine. No new or progressive osseous metastasis identified. 2. Progressive lobular thickening of the right adrenal gland without discrete nodularity, nonspecific and possibly reflecting adrenal hyperplasia. Suggest attention on follow-up imaging. 3. No definitive evidence of new or progressive metastatic disease in the chest, abdomen or pelvis. 4. Similar nonspecific trace abdominopelvic free fluid. 5. Symmetric distal esophageal wall thickening similar prior most  commonly reflecting esophagitis. 6. Aortic Atherosclerosis (ICD10-I70.0) and Emphysema (ICD10-J43.9). Electronically Signed   By: Maudry Mayhew M.D.   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 He has molecular studies by foundation 1 that showed no actionable mutations and PD-L1 expression was 90%. He underwent palliative radiotherapy to the L2 vertebral body compression fraction as well as solitary brain metastasis with SRS. He is currently undergoing palliative systemic chemotherapy with carboplatin for AUC of 5, Alimta 500 Mg/M2 and Keytruda 200 Mg IV every 3 weeks.  Status post 24 cycles. Starting from cycle #5 he is on treatment with maintenance Alimta and Keytruda every 3 weeks.  He has been tolerating this treatment well with no concerning adverse effects.    Stage IV Non-Small Cell Lung Cancer (NSCLC), Adenocarcinoma Diagnosed September 2023. Tumor lacks actionable mutation, PD-L1 expression 90%. Initially received radiation to L2. Currently on cycle 25 of treatment with Palliative systemic therapy with carboplatin for AUC of 5, Alimta 500 Mg/M2 and Keytruda 200 Mg IV every 3 weeks.  First dose September 05, 2022.  Status post 24 cycles.  Starting from cycle #5 the patient will be on maintenance treatment with Alimta and Keytruda every 3 weeks.. No symptoms of nausea, vomiting, weight loss, chest pain, dyspnea, or cough. Lab work acceptable for treatment today. - Proceed with cycle 25 of treatment - Schedule follow-up in three weeks  Spinal Metastasis Scheduled for surgery next Tuesday by the same neurosurgeon who treated brain metastasis. - Proceed with scheduled spinal surgery next Tuesday.     For the pain management, he is currently on MS Contin 15 mg p.o. twice daily in addition to Oxy IR for  breakthrough pain and managed by the palliative care team.  He was advised to call immediately if he has any other concerning symptoms in the interval. The patient voices understanding of current disease status and treatment options and is in agreement with the current care plan.  All questions were answered. The patient knows to call the clinic with any problems, questions or concerns. We can certainly see the patient much sooner if necessary.  The total time spent in the appointment was 20 minutes.  Disclaimer: This note was dictated with voice recognition software. Similar sounding words can inadvertently be transcribed and may not be corrected upon review.

## 2024-01-30 ENCOUNTER — Encounter (HOSPITAL_COMMUNITY)
Admission: RE | Admit: 2024-01-30 | Discharge: 2024-01-30 | Disposition: A | Discharge status: Home / Self Care | Source: Ambulatory Visit | Attending: Neurosurgery | Admitting: Neurosurgery

## 2024-01-30 ENCOUNTER — Other Ambulatory Visit: Payer: Self-pay

## 2024-01-30 ENCOUNTER — Encounter (HOSPITAL_COMMUNITY): Payer: Self-pay

## 2024-01-30 VITALS — BP 149/82 | HR 95 | Temp 98.2°F | Resp 17 | Ht 68.0 in | Wt 162.8 lb

## 2024-01-30 DIAGNOSIS — C7931 Secondary malignant neoplasm of brain: Secondary | ICD-10-CM | POA: Diagnosis not present

## 2024-01-30 DIAGNOSIS — Z01812 Encounter for preprocedural laboratory examination: Secondary | ICD-10-CM | POA: Diagnosis present

## 2024-01-30 DIAGNOSIS — E119 Type 2 diabetes mellitus without complications: Secondary | ICD-10-CM | POA: Insufficient documentation

## 2024-01-30 DIAGNOSIS — J439 Emphysema, unspecified: Secondary | ICD-10-CM | POA: Diagnosis not present

## 2024-01-30 DIAGNOSIS — I700 Atherosclerosis of aorta: Secondary | ICD-10-CM | POA: Diagnosis not present

## 2024-01-30 DIAGNOSIS — Z01818 Encounter for other preprocedural examination: Secondary | ICD-10-CM

## 2024-01-30 DIAGNOSIS — Z85118 Personal history of other malignant neoplasm of bronchus and lung: Secondary | ICD-10-CM | POA: Insufficient documentation

## 2024-01-30 DIAGNOSIS — Z79899 Other long term (current) drug therapy: Secondary | ICD-10-CM | POA: Diagnosis not present

## 2024-01-30 HISTORY — DX: Depression, unspecified: F32.A

## 2024-01-30 HISTORY — DX: Anxiety disorder, unspecified: F41.9

## 2024-01-30 LAB — SURGICAL PCR SCREEN
MRSA, PCR: NEGATIVE
Staphylococcus aureus: NEGATIVE

## 2024-01-30 LAB — TYPE AND SCREEN
ABO/RH(D): A POS
Antibody Screen: NEGATIVE

## 2024-01-30 LAB — GLUCOSE, CAPILLARY: Glucose-Capillary: 183 mg/dL — ABNORMAL HIGH (ref 70–99)

## 2024-01-30 NOTE — Progress Notes (Signed)
 PCP - Dr. Hoy Register Cardiologist - denies Oncologist- Dr. Janyth Contes  PPM/ICD - denies   Chest x-ray - 07/28/22 EKG - 08/28/23 Stress Test - denies ECHO - denies Cardiac Cath - denies  Sleep Study - denies   Fasting Blood Sugar - 180-200 Checks Blood Sugar once a day  Last dose of GLP1 agonist-  n/a   ASA/Blood Thinner Instructions: n/a   ERAS Protcol - no, NPO   COVID TEST- n/a   Anesthesia review: yes, HgbA1c on 2/27 was 10.5  Patient denies shortness of breath, fever, cough and chest pain at PAT appointment   All instructions explained to the patient, with a verbal understanding of the material. Patient agrees to go over the instructions while at home for a better understanding.  The opportunity to ask questions was provided.

## 2024-01-31 NOTE — Progress Notes (Signed)
 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 He is followed by oncologist Dr. Arbutus Ped and he is currently undergoing palliative systemic chemotherapy. For pain management, he is currently on MS Contin 15 mg p.o. twice daily in addition to Oxy IR for breakthrough pain.  Other pertinent hx includes poorly controlled IDDM2, A1c 10.5.  CMP and CBC from 01/29/24 reviewed, glucose elevated 327, mild anemia hgb 12.6, otherwise unremarkable. CBG was 183 at preop vitit on 01/30/24.  EKG 08/28/23: Sinus rhythm. Rate 73. Borderline low voltage, extremity leads. Minimal ST elevation, inferior leads  CT Chest/abdomen/pelvis 01/01/24: IMPRESSION: 1. Stable appearance of the mixed sclerotic and lucencies in portions of the thoracic and lumbar spine. No new or progressive osseous metastasis identified. 2. Progressive lobular thickening of the right adrenal gland without discrete nodularity, nonspecific and possibly reflecting adrenal hyperplasia. Suggest attention on follow-up imaging. 3. No definitive evidence of new or progressive metastatic disease in the chest, abdomen or pelvis. 4. Similar nonspecific trace abdominopelvic free fluid. 5. Symmetric distal esophageal wall thickening similar prior most commonly reflecting esophagitis. 6. Aortic Atherosclerosis (ICD10-I70.0) and Emphysema (ICD10-J43.9).    Samuel [name] [facility]/Anesthesiology Phone [phone] 01/31/2024 2:43 PM

## 2024-01-31 NOTE — Anesthesia Preprocedure Evaluation (Addendum)
 Anesthesia Evaluation  Patient identified by MRN, date of birth, ID band Patient awake    Reviewed: Allergy & Precautions, NPO status , Patient's Chart, lab work & pertinent test results  History of Anesthesia Complications Negative for: history of anesthetic complications  Airway Mallampati: I  TM Distance: >3 FB Neck ROM: Full    Dental  (+) Edentulous Lower, Edentulous Upper, Dental Advisory Given   Pulmonary neg shortness of breath, neg COPD, Recent URI , Resolved, Current SmokerPatient did not abstain from smoking.   breath sounds clear to auscultation       Cardiovascular (-) hypertension(-) angina (-) Past MI and (-) CHF  Rhythm:Regular     Neuro/Psych neg Seizures PSYCHIATRIC DISORDERS Anxiety Depression     Neuromuscular disease    GI/Hepatic Neg liver ROS,GERD  ,,  Endo/Other  diabetes    Renal/GU Renal diseaseLab Results      Component                Value               Date                      NA                       135                 02/19/2024                K                        4.1                 02/19/2024                CO2                      26                  02/19/2024                GLUCOSE                  251 (H)             02/19/2024                BUN                      14                  02/19/2024                CREATININE               0.57 (L)            02/19/2024                CALCIUM                  8.9                 02/19/2024                GFRNONAA                 >60  02/19/2024                Musculoskeletal negative musculoskeletal ROS (+)    Abdominal   Peds  Hematology Lab Results      Component                Value               Date                      WBC                      5.5                 02/19/2024                HGB                      12.9 (L)            02/19/2024                HCT                      38.0 (L)             02/19/2024                MCV                      96.0                02/19/2024                PLT                      246                 02/19/2024              Anesthesia Other Findings   Reproductive/Obstetrics                             Anesthesia Physical Anesthesia Plan  ASA: 2  Anesthesia Plan: General   Post-op Pain Management: Ofirmev IV (intra-op)*, Ketamine IV* and Precedex   Induction: Intravenous  PONV Risk Score and Plan: 2 and Ondansetron and Dexamethasone  Airway Management Planned: Oral ETT  Additional Equipment: None  Intra-op Plan:   Post-operative Plan: Extubation in OR  Informed Consent: I have reviewed the patients History and Physical, chart, labs and discussed the procedure including the risks, benefits and alternatives for the proposed anesthesia with the patient or authorized representative who has indicated his/her understanding and acceptance.     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 He is followed by oncologist Dr. Arbutus Ped and he is currently undergoing palliative systemic chemotherapy. For pain management, he is currently on MS Contin 15 mg p.o. twice daily in  addition to Oxy IR for breakthrough pain.  Other pertinent hx includes poorly controlled IDDM2, A1c 10.5.  CMP and CBC from 01/29/24 reviewed, glucose elevated 327, mild anemia hgb 12.6, otherwise unremarkable. CBG was 183 at preop vitit on 01/30/24.  EKG 08/28/23: Sinus rhythm. Rate 73.  Borderline low voltage, extremity leads. Minimal ST elevation, inferior leads  CT Chest/abdomen/pelvis 01/01/24: IMPRESSION: 1. Stable appearance of the mixed sclerotic and lucencies in portions of the thoracic and lumbar spine. No new or progressive osseous metastasis identified. 2. Progressive lobular thickening of the right adrenal gland without discrete nodularity, nonspecific and possibly reflecting adrenal hyperplasia. Suggest attention on follow-up imaging. 3. No definitive evidence of new or progressive metastatic disease in the chest, abdomen or pelvis. 4. Similar nonspecific trace abdominopelvic free fluid. 5. Symmetric distal esophageal wall thickening similar prior most commonly reflecting esophagitis. 6. Aortic Atherosclerosis (ICD10-I70.0) and Emphysema (ICD10-J43.9).  )        Anesthesia Quick Evaluation

## 2024-02-05 ENCOUNTER — Ambulatory Visit: Payer: Self-pay | Admitting: Family Medicine

## 2024-02-05 NOTE — Telephone Encounter (Signed)
 Copied from CRM [id number]. Topic: Clinical - Medical Advice >> Feb 05, 2024  2:49 PM Runell Gess P wrote: Reason for CRM: patient called saying he has head congestion and some chest but mostly head going on for a week now.  He is taking chemo every three weeks for lung cancer.  He does not know whether to call his oncologist or primary.   Please advise.  [phone]   Chief Complaint: Sinus congestion  Symptoms: Nasal congestion, cough, headache  Frequency: Constant  Pertinent Negatives: Patient denies fever Disposition: [] ED /[] Urgent Care (no appt availability in office) / [x] Appointment(In office/virtual)/ []  [facility]/ [] Home Care/ [] Refused Recommended Disposition /[] [facility]/ []  Follow-up with PCP Additional Notes: Patient reports that he has been experiencing sinus congestion, headache, and cough for about 1 week. He states that his cough is productive of yellow sputum. He denies any fevers. Patient has been taking Sudafed which provides short term relief of symptoms. Patient is on chemotherapy currently and wanted to be evaluated for his symptoms. Appointment made for the patient on Thursday. Patient will also contact his oncologist.    Reason for Disposition  [1] Sinus congestion as part of a cold AND [2] present < 10 days    Appointment scheduled due to patient's history  Answer Assessment - Initial Assessment Questions 1. LOCATION: "Where does it hurt?"      Top of head  2. ONSET: "When did the sinus pain start?"  (e.g., hours, days)      1 week ago  3. SEVERITY: "How bad is the pain?"   (Scale 1-10; mild, moderate or severe)   - MILD (1-3): doesn't interfere with normal activities    - MODERATE (4-7): interferes with normal activities (e.g., work or school) or awakens from sleep   - SEVERE (8-10): excruciating pain and patient unable to do any normal activities        6/10 4. RECURRENT SYMPTOM: "Have you ever had sinus problems before?" If Yes, ask:  "When was the last time?" and "What happened that time?"      No 5. NASAL CONGESTION: "Is the nose blocked?" If Yes, ask: "Can you open it or must you breathe through your mouth?"     Yes 6. NASAL DISCHARGE: "Do you have discharge from your nose?" If so ask, "What color?"     Yellow  7. FEVER: "Do you have a fever?" If Yes, ask: "What is it, how was it measured, and when did it start?"      No 8. OTHER SYMPTOMS: "Do you have any other symptoms?" (e.g., sore throat, cough, earache, difficulty breathing)     Fatigue, cough  Protocols used: Sinus Pain or Congestion-A-AH

## 2024-02-06 ENCOUNTER — Telehealth (HOSPITAL_BASED_OUTPATIENT_CLINIC_OR_DEPARTMENT_OTHER): Admitting: Family Medicine

## 2024-02-06 ENCOUNTER — Other Ambulatory Visit (HOSPITAL_COMMUNITY): Payer: Self-pay

## 2024-02-06 ENCOUNTER — Telehealth: Admitting: Family Medicine

## 2024-02-06 ENCOUNTER — Encounter: Payer: Self-pay | Admitting: Family Medicine

## 2024-02-06 DIAGNOSIS — J329 Chronic sinusitis, unspecified: Secondary | ICD-10-CM

## 2024-02-06 DIAGNOSIS — J302 Other seasonal allergic rhinitis: Secondary | ICD-10-CM

## 2024-02-06 DIAGNOSIS — J069 Acute upper respiratory infection, unspecified: Secondary | ICD-10-CM

## 2024-02-06 MED ORDER — CETIRIZINE HCL 10 MG PO TABS
10.0000 mg | ORAL_TABLET | Freq: Every day | ORAL | 1 refills | Status: DC
  Filled 2024-02-06: qty 30, 30d supply, fill #0
  Filled 2024-03-30: qty 30, 30d supply, fill #1

## 2024-02-06 MED ORDER — DOXYCYCLINE HYCLATE 100 MG PO TABS
100.0000 mg | ORAL_TABLET | Freq: Two times a day (BID) | ORAL | 0 refills | Status: DC
  Filled 2024-02-06: qty 20, 10d supply, fill #0

## 2024-02-06 MED ORDER — FLUTICASONE PROPIONATE 50 MCG/ACT NA SUSP
2.0000 | Freq: Every day | NASAL | 1 refills | Status: DC
  Filled 2024-02-06: qty 16, 30d supply, fill #0
  Filled 2024-04-04: qty 16, 30d supply, fill #1

## 2024-02-06 NOTE — Progress Notes (Signed)
 Virtual Visit via Video Note  I connected with Samuel [name], on 02/06/2024 at 8:12 AM by video enabled telemedicine device and verified that I am speaking with the correct person using two identifiers.   Consent: I discussed the limitations, risks, security and privacy concerns of performing an evaluation and management service by telemedicine and the availability of in person appointments. I also discussed with the patient that there may be a patient responsible charge related to this service. The patient expressed understanding and agreed to proceed.   Location of Patient: Home  Location of Provider: Clinic   Persons participating in Telemedicine visit: Samuel [name] Dr. Alvis Lemmings    Discussed the use of AI scribe software for clinical note transcription with the patient, who gave verbal consent to proceed.  History of Present Illness The patient, with a history of  type 2 diabetes mellitus (A1c 10.5), tobacco abuse, alcohol abuse, major depressive disorder, stage IV (T3a, N2, M1C) non-small cell lung cancer with metastasis to the brain and spine, extensive bone disease status post kyphoplasty L2 lesion (currently on palliative chemotherapy) presents with persistent upper respiratory symptoms for over a week. He reports feeling weak for a day or two after his last treatment, and has been experiencing a build-up of mucus. He has been self-medicating with DayQuil and emergen-C for his immune system. He also reports pressure from the side of his nose through the top of his head, which was temporarily relieved by Sudafed. He has been coughing up mucus, which was initially draining down his throat. He also reports sneezing, itchy and watery eyes, chills, body aches, joint pain, and a feeling of clogged ears. He has noticed some wheezing. He has never had allergy problems before, but does sneeze when trees come in and go out. The mucus has been clear and sometimes a little green. He  was supposed to have back surgery, but it was postponed due to his current symptoms.      Past Medical History:  Diagnosis Date   Alcohol use disorder, severe, dependence (HCC) 12/26/2016   Anxiety    Depression    Diabetes mellitus without complication (HCC)    Metastatic cancer to brain (HCC) 08/05/2022   Metastatic cancer to spine (HCC) 07/19/2022   Pancreatitis unk   Primary lung adenocarcinoma (HCC) 08/05/2022   Suicidal ideation    Type 2 diabetes mellitus with hyperglycemia, without long-term current use of insulin (HCC) 06/13/2022   Formatting of this note might be different from the original. 06/13/2022 A1C 13.8, FSBG 414 Start empagliflozin 5mg /metformin 1000 daily, levemir 20 qhs. (Samples given) Will apply for medassist for pharmacy   Allergies  Allergen Reactions   Penicillins Hives   Percocet [Oxycodone-Acetaminophen] Nausea Only and Other (See Comments)    Pt states the pill is hard to swallow. States he was told it may be the coating on the tablet that he is allergic to.     Current Outpatient Medications on File Prior to Visit  Medication Sig Dispense Refill   Accu-Chek Softclix Lancets lancets Use to check blood sugar 3 times daily. E11.69 100 each 6   acetaminophen (TYLENOL) 500 MG tablet Take 1,000 mg by mouth every 6 (six) hours as needed for moderate pain (pain score 4-6).     atorvastatin (LIPITOR) 40 MG tablet Take 1 tablet (40 mg total) by mouth daily. 90 tablet 1   buPROPion (WELLBUTRIN XL) 150 MG 24 hr tablet Take 1 tablet (150 mg total) by mouth  daily. 90 tablet 1   empagliflozin (JARDIANCE) 25 MG TABS tablet Take 1 tablet (25 mg total) by mouth daily before breakfast. 90 tablet 1   folic acid (FOLVITE) 1 MG tablet Take 1 tablet (1 mg total) by mouth daily. 30 tablet 4   gabapentin (NEURONTIN) 300 MG capsule Take 2 capsules (600 mg total) by mouth 3 (three) times daily. 180 capsule 1   glucose blood (ACCU-CHEK GUIDE TEST) test strip Use as instructed  twice daily 100 each 12   glucose blood (ACCU-CHEK GUIDE) test strip Use to check blood sugar 3 times daily. E11.69 100 each 6   insulin glargine (LANTUS SOLOSTAR) 100 UNIT/ML Solostar Pen Inject 50 Units into the skin daily. 30 mL 11   Insulin Pen Needle 32G X 4 MM MISC Use with Insulin pen 100 each 3   oxyCODONE (OXYCONTIN) 10 mg 12 hr tablet Take 1 tablet (10 mg total) by mouth every 12 (twelve) hours. 60 tablet 0   Oxycodone HCl 10 MG TABS Take 1 tablet (10 mg) by mouth every 6 hours as needed for severe pain. 60 tablet 0   tadalafil (CIALIS) 10 MG tablet Take 1 tablet (10 mg total) by mouth every other day as needed for erectile dysfunction. 10 tablet 2   No current facility-administered medications on file prior to visit.    ROS: See HPI  Observations/Objective: Awake, alert, oriented x3 Not in acute distress No sinus tenderness on palpation of sinuses Normal mood      Latest Ref Rng & Units 01/29/2024    7:45 AM 01/08/2024    7:39 AM 12/18/2023    8:34 AM  CMP  Glucose 70 - 99 mg/dL 578  469  629   BUN 6 - 20 mg/dL 12  13  13    Creatinine 0.61 - 1.24 mg/dL 5.28  4.13  2.44   Sodium 135 - 145 mmol/L 135  133  140   Potassium 3.5 - 5.1 mmol/L 4.0  3.8  4.0   Chloride 98 - 111 mmol/L 105  102  106   CO2 22 - 32 mmol/L 26  25  28    Calcium 8.9 - 10.3 mg/dL 8.3  8.7  9.5   Total Protein 6.5 - 8.1 g/dL 6.9  6.9  7.2   Total Bilirubin 0.0 - 1.2 mg/dL 0.2  0.3  0.4   Alkaline Phos 38 - 126 U/L 82  75  65   AST 15 - 41 U/L 25  21  25    ALT 0 - 44 U/L 36  24  33     Lipid Panel     Component Value Date/Time   CHOL 133 07/12/2023 1116   TRIG 64 07/12/2023 1116   HDL 41 07/12/2023 1116   CHOLHDL 3.2 07/12/2023 1116   CHOLHDL 2.4 03/11/2021 1843   VLDL 23 03/11/2021 1843   LDLCALC 79 07/12/2023 1116   LABVLDL 13 07/12/2023 1116    Lab Results  Component Value Date   HGBA1C 10.5 (A) 01/18/2024     Assessment & Plan Upper respiratory tract infection with sinus  involvement Symptoms and immunocompromised status suggest bacterial infection. Antibiotics required to prevent complications and ensure surgical readiness. - Prescribed doxycycline 100 mg BID for 10 days. - Prescribed nasal corticosteroid spray QD. - Prescribed cetirizine 10 mg QD. - Advised continuation of nasal spray and cetirizine until surgery.  Goals of Care Back surgery delayed due to illness. Infection resolution necessary for rescheduling on April 8th. - Ensure  completion of antibiotics and continuation of nasal spray and cetirizine until surgery.      No orders of the defined types were placed in this encounter.   Follow Up Instructions: Keep previously scheduled appointment   I discussed the assessment and treatment plan with the patient. The patient was provided an opportunity to ask questions and all were answered. The patient agreed with the plan and demonstrated an understanding of the instructions.   The patient was advised to call back or seek an in-person evaluation if the symptoms worsen or if the condition fails to improve as anticipated.     I provided 14 minutes total of Telehealth time during this encounter including median intraservice time, reviewing previous notes, investigations, ordering medications, medical decision making, coordinating care and patient verbalized understanding at the end of the visit.     Hoy Register, MD, FAAFP. [facility] [place], [place] [phone]   02/06/2024, 8:12 AM

## 2024-02-08 ENCOUNTER — Ambulatory Visit: Admitting: Internal Medicine

## 2024-02-12 ENCOUNTER — Inpatient Hospital Stay: Payer: Medicaid Other

## 2024-02-13 ENCOUNTER — Other Ambulatory Visit: Payer: Self-pay | Admitting: Nurse Practitioner

## 2024-02-13 ENCOUNTER — Other Ambulatory Visit (HOSPITAL_COMMUNITY): Payer: Self-pay

## 2024-02-13 DIAGNOSIS — Z515 Encounter for palliative care: Secondary | ICD-10-CM

## 2024-02-13 DIAGNOSIS — G893 Neoplasm related pain (acute) (chronic): Secondary | ICD-10-CM

## 2024-02-13 DIAGNOSIS — C3490 Malignant neoplasm of unspecified part of unspecified bronchus or lung: Secondary | ICD-10-CM

## 2024-02-13 DIAGNOSIS — M792 Neuralgia and neuritis, unspecified: Secondary | ICD-10-CM

## 2024-02-13 DIAGNOSIS — C7951 Secondary malignant neoplasm of bone: Secondary | ICD-10-CM

## 2024-02-13 MED ORDER — OXYCODONE HCL 10 MG PO TABS
10.0000 mg | ORAL_TABLET | Freq: Four times a day (QID) | ORAL | 0 refills | Status: DC | PRN
  Filled 2024-02-13: qty 60, 15d supply, fill #0

## 2024-02-14 ENCOUNTER — Other Ambulatory Visit (HOSPITAL_COMMUNITY): Payer: Self-pay

## 2024-02-15 ENCOUNTER — Encounter: Payer: Self-pay | Admitting: Pharmacist

## 2024-02-15 ENCOUNTER — Other Ambulatory Visit: Payer: Self-pay

## 2024-02-15 ENCOUNTER — Ambulatory Visit: Payer: Self-pay | Attending: Family Medicine | Admitting: Pharmacist

## 2024-02-15 ENCOUNTER — Telehealth: Payer: Self-pay | Admitting: Pharmacist

## 2024-02-15 DIAGNOSIS — Z7984 Long term (current) use of oral hypoglycemic drugs: Secondary | ICD-10-CM

## 2024-02-15 DIAGNOSIS — E1142 Type 2 diabetes mellitus with diabetic polyneuropathy: Secondary | ICD-10-CM | POA: Diagnosis not present

## 2024-02-15 DIAGNOSIS — Z794 Long term (current) use of insulin: Secondary | ICD-10-CM

## 2024-02-15 MED ORDER — FREESTYLE LIBRE 3 PLUS SENSOR MISC
6 refills | Status: DC
  Filled 2024-02-15: qty 2, 30d supply, fill #0
  Filled 2024-03-30: qty 2, 30d supply, fill #1
  Filled 2024-04-23: qty 2, 30d supply, fill #2
  Filled 2024-05-10 – 2024-05-19 (×2): qty 2, 30d supply, fill #3
  Filled 2024-06-29: qty 2, 30d supply, fill #4
  Filled 2024-08-06: qty 2, 30d supply, fill #5
  Filled 2024-08-08: qty 2, 30d supply, fill #0
  Filled 2024-09-12: qty 2, 30d supply, fill #1
  Filled ????-??-??: fill #0

## 2024-02-15 MED ORDER — FREESTYLE LIBRE 3 READER DEVI
0 refills | Status: AC
  Filled 2024-02-15: qty 1, 365d supply, fill #0

## 2024-02-15 NOTE — Telephone Encounter (Signed)
 Pt with Medicaid on insulin. Can we start a PA for the [place]?

## 2024-02-15 NOTE — Progress Notes (Signed)
    S:     No chief complaint on file.  58 y.o. male who presents for diabetes evaluation, education, and management.  PMH is significant for stage IV NSCLC (currently undergoing palliative systemic chemotherapy w/ Dr. Arbutus Ped), T2DM w/ hx of microalbuminuria & retinopathy, HLD, GAD/MDD, GERD.   Patient was referred and last seen by Primary Care Provider, Dr. Alvis Lemmings, on 01/18/2024. At that visit, A1c was up to 10.5 (from 8.4 prior). His Lantus was resumed at 50u daily.   Today, patient arrives in good spirits and presents without any assistance. Patient was previously on Victoza and was unable to tolerate. Was also previously on glimepiride, Levemir, and metformin. Now, he takes Lantus once daily but voices concern for persistent hypoglycemia over the last couple of weeks. Summarized below.    Current diabetes medications include: Jardiance 25mg  daily, Lantus 50 units daily (takes 40u d/t hyypoglycemia) Patient reports adherence to taking all medications as prescribed.   Insurance coverage: Medicaid  Patient reports hypoglycemic events. Gives readings in the 50s-80s  Reported home fasting blood sugars: labile blood sugar with wide range. I am unable to verify with his glucometer today.   Patient denies nocturia (nighttime urination).  Patient reports neuropathy (nerve pain). Patient denies visual changes.  Patient reports self foot exams.   Patient reported dietary habits: Eats 3-4 meals/day Eats "pretty healthy most of the time" Has cookies and chips Appetite has returned  Patient-reported exercise habits: -3-4 days 1.5 hours at a time  O:    Lab Results  Component Value Date   HGBA1C 10.5 (A) 01/18/2024   There were no vitals filed for this visit.  Lipid Panel     Component Value Date/Time   CHOL 133 07/12/2023 1116   TRIG 64 07/12/2023 1116   HDL 41 07/12/2023 1116   CHOLHDL 3.2 07/12/2023 1116   CHOLHDL 2.4 03/11/2021 1843   VLDL 23 03/11/2021 1843   LDLCALC  79 07/12/2023 1116    Clinical Atherosclerotic Cardiovascular Disease (ASCVD): Yes  The ASCVD Risk score (Arnett DK, et al., 2019) failed to calculate for the following reasons:   Risk score cannot be calculated because patient has a medical history suggesting prior/existing ASCVD   A/P: Diabetes longstanding currently above goal with A1c. He remains symptomatic but is able to verbalize appropriate hypoglycemia management plan. He self-decreased his insulin dose d/t hypoglycemia. He expresses interest in CGM today.  -Change Lantus (insulin glargine) to 20u BID per patient request. -Continued SGLT2-I Jardiance (empagliflozin) 10 mg. Counseled on sick day rules. -Rxn sent for CGM services. Josephine Igo will help guide [place] better in this setting with his labile blood sugar and hypoglycemia. Will work to obtain PA for this. -Extensively discussed pathophysiology of diabetes, recommended lifestyle interventions, dietary effects on blood sugar control.  -Counseled on s/sx of and management of hypoglycemia.  -Next A1c anticipated today.   Written patient instructions provided. Patient verbalized understanding of treatment plan.  Total time in face to face counseling 30 minutes.    Follow-up:  Pharmacist 1 month. PCP clinic visit in May.   Butch Penny, PharmD, P[place]y [place], CPP Clinical Pharmacist [facility] [phone]

## 2024-02-16 ENCOUNTER — Other Ambulatory Visit: Payer: Self-pay | Admitting: Nurse Practitioner

## 2024-02-16 ENCOUNTER — Telehealth: Payer: Self-pay

## 2024-02-16 ENCOUNTER — Other Ambulatory Visit: Payer: Self-pay

## 2024-02-16 ENCOUNTER — Other Ambulatory Visit (HOSPITAL_COMMUNITY): Payer: Self-pay

## 2024-02-16 DIAGNOSIS — C3490 Malignant neoplasm of unspecified part of unspecified bronchus or lung: Secondary | ICD-10-CM

## 2024-02-16 DIAGNOSIS — G893 Neoplasm related pain (acute) (chronic): Secondary | ICD-10-CM

## 2024-02-16 DIAGNOSIS — Z515 Encounter for palliative care: Secondary | ICD-10-CM

## 2024-02-16 MED ORDER — OXYCODONE HCL ER 10 MG PO T12A
10.0000 mg | EXTENDED_RELEASE_TABLET | Freq: Two times a day (BID) | ORAL | 0 refills | Status: DC
Start: 2024-02-16 — End: 2024-03-11
  Filled 2024-02-16 – 2024-02-19 (×3): qty 60, 30d supply, fill #0

## 2024-02-16 NOTE — Telephone Encounter (Signed)
 Pharmacy Patient Advocate Encounter  Received notification from [facility] that Prior Authorization for FREESTYLE LIBRE 3 PLUS READER AND SENSORS has been APPROVED from 02/16/2024 to 08/18/2024   PA #/Case ID/Reference #: [id number] & [id number]

## 2024-02-19 ENCOUNTER — Inpatient Hospital Stay (HOSPITAL_BASED_OUTPATIENT_CLINIC_OR_DEPARTMENT_OTHER): Admitting: Nurse Practitioner

## 2024-02-19 ENCOUNTER — Inpatient Hospital Stay: Payer: Medicaid Other

## 2024-02-19 ENCOUNTER — Inpatient Hospital Stay (HOSPITAL_BASED_OUTPATIENT_CLINIC_OR_DEPARTMENT_OTHER): Payer: Medicaid Other | Admitting: Internal Medicine

## 2024-02-19 ENCOUNTER — Other Ambulatory Visit (HOSPITAL_COMMUNITY): Payer: Self-pay

## 2024-02-19 ENCOUNTER — Other Ambulatory Visit: Payer: Self-pay

## 2024-02-19 ENCOUNTER — Encounter: Payer: Self-pay | Admitting: Nurse Practitioner

## 2024-02-19 VITALS — BP 136/99 | HR 84 | Temp 98.2°F | Resp 17 | Ht 68.0 in | Wt 157.9 lb

## 2024-02-19 VITALS — BP 132/88

## 2024-02-19 DIAGNOSIS — C7951 Secondary malignant neoplasm of bone: Secondary | ICD-10-CM | POA: Diagnosis not present

## 2024-02-19 DIAGNOSIS — C3490 Malignant neoplasm of unspecified part of unspecified bronchus or lung: Secondary | ICD-10-CM

## 2024-02-19 DIAGNOSIS — Z515 Encounter for palliative care: Secondary | ICD-10-CM | POA: Diagnosis not present

## 2024-02-19 DIAGNOSIS — C3491 Malignant neoplasm of unspecified part of right bronchus or lung: Secondary | ICD-10-CM | POA: Diagnosis not present

## 2024-02-19 DIAGNOSIS — G893 Neoplasm related pain (acute) (chronic): Secondary | ICD-10-CM

## 2024-02-19 DIAGNOSIS — Z5112 Encounter for antineoplastic immunotherapy: Secondary | ICD-10-CM | POA: Diagnosis not present

## 2024-02-19 DIAGNOSIS — C7931 Secondary malignant neoplasm of brain: Secondary | ICD-10-CM

## 2024-02-19 LAB — CBC WITH DIFFERENTIAL (CANCER CENTER ONLY)
Abs Immature Granulocytes: 0.01 10*3/uL (ref 0.00–0.07)
Basophils Absolute: 0 10*3/uL (ref 0.0–0.1)
Basophils Relative: 1 %
Eosinophils Absolute: 0.1 10*3/uL (ref 0.0–0.5)
Eosinophils Relative: 1 %
HCT: 38 % — ABNORMAL LOW (ref 39.0–52.0)
Hemoglobin: 12.9 g/dL — ABNORMAL LOW (ref 13.0–17.0)
Immature Granulocytes: 0 %
Lymphocytes Relative: 21 %
Lymphs Abs: 1.2 10*3/uL (ref 0.7–4.0)
MCH: 32.6 pg (ref 26.0–34.0)
MCHC: 33.9 g/dL (ref 30.0–36.0)
MCV: 96 fL (ref 80.0–100.0)
Monocytes Absolute: 0.5 10*3/uL (ref 0.1–1.0)
Monocytes Relative: 10 %
Neutro Abs: 3.7 10*3/uL (ref 1.7–7.7)
Neutrophils Relative %: 67 %
Platelet Count: 246 10*3/uL (ref 150–400)
RBC: 3.96 MIL/uL — ABNORMAL LOW (ref 4.22–5.81)
RDW: 13.6 % (ref 11.5–15.5)
WBC Count: 5.5 10*3/uL (ref 4.0–10.5)
nRBC: 0 % (ref 0.0–0.2)

## 2024-02-19 LAB — CMP (CANCER CENTER ONLY)
ALT: 41 U/L (ref 0–44)
AST: 31 U/L (ref 15–41)
Albumin: 3.7 g/dL (ref 3.5–5.0)
Alkaline Phosphatase: 68 U/L (ref 38–126)
Anion gap: 4 — ABNORMAL LOW (ref 5–15)
BUN: 14 mg/dL (ref 6–20)
CO2: 26 mmol/L (ref 22–32)
Calcium: 8.9 mg/dL (ref 8.9–10.3)
Chloride: 105 mmol/L (ref 98–111)
Creatinine: 0.57 mg/dL — ABNORMAL LOW (ref 0.61–1.24)
GFR, Estimated: >60 mL/min (ref >60)
Glucose, Bld: 251 mg/dL — ABNORMAL HIGH (ref 70–99)
Potassium: 4.1 mmol/L (ref 3.5–5.1)
Sodium: 135 mmol/L (ref 135–145)
Total Bilirubin: 0.4 mg/dL (ref 0.0–1.2)
Total Protein: 7.2 g/dL (ref 6.5–8.1)

## 2024-02-19 MED ORDER — SODIUM CHLORIDE 0.9 % IV SOLN
500.0000 mg/m2 | Freq: Once | INTRAVENOUS | Status: AC
  Administered 2024-02-19: 900 mg via INTRAVENOUS
  Filled 2024-02-19: qty 20

## 2024-02-19 MED ORDER — PROCHLORPERAZINE MALEATE 10 MG PO TABS
10.0000 mg | ORAL_TABLET | Freq: Once | ORAL | Status: AC
  Administered 2024-02-19: 10 mg via ORAL

## 2024-02-19 MED ORDER — SODIUM CHLORIDE 0.9 % IV SOLN: Freq: Once | INTRAVENOUS | Status: AC

## 2024-02-19 MED ORDER — SODIUM CHLORIDE 0.9% FLUSH
10.0000 mL | Freq: Once | INTRAVENOUS | Status: AC
Start: 2024-02-19 — End: 2024-02-19
  Administered 2024-02-19: 10 mL

## 2024-02-19 MED ORDER — SODIUM CHLORIDE 0.9 % IV SOLN
200.0000 mg | Freq: Once | INTRAVENOUS | Status: AC
  Administered 2024-02-19: 200 mg via INTRAVENOUS
  Filled 2024-02-19: qty 200

## 2024-02-19 NOTE — Patient Instructions (Signed)
 [facility] - A DEPT OF [facility]  Discharge Instructions: Thank you for choosing [facility] to provide your oncology and hematology care.   If you have a lab appointment with the Cancer Center, please go directly to the Cancer Center and check in at the registration area.   Wear comfortable clothing and clothing appropriate for easy access to any Portacath or PICC line.   We strive to give you quality time with your provider. You may need to reschedule your appointment if you arrive late (15 or more minutes).  Arriving late affects you and other patients whose appointments are after yours.  Also, if you miss three or more appointments without notifying the office, you may be dismissed from the clinic at the provider's discretion.      For prescription refill requests, have your pharmacy contact our office and allow 72 hours for refills to be completed.    Today you received the following chemotherapy and/or immunotherapy agents: Keytruda, Alimta.       To help prevent nausea and vomiting after your treatment, we encourage you to take your nausea medication as directed.  BELOW ARE SYMPTOMS THAT SHOULD BE REPORTED IMMEDIATELY: *FEVER GREATER THAN 100.4 F (38 C) OR HIGHER *CHILLS OR SWEATING *NAUSEA AND VOMITING THAT IS NOT CONTROLLED WITH YOUR NAUSEA MEDICATION *UNUSUAL SHORTNESS OF BREATH *UNUSUAL BRUISING OR BLEEDING *URINARY PROBLEMS (pain or burning when urinating, or frequent urination) *BOWEL PROBLEMS (unusual diarrhea, constipation, pain near the anus) TENDERNESS IN MOUTH AND THROAT WITH OR WITHOUT PRESENCE OF ULCERS (sore throat, sores in mouth, or a toothache) UNUSUAL RASH, SWELLING OR PAIN  UNUSUAL VAGINAL DISCHARGE OR ITCHING   Items with * indicate a potential emergency and should be followed up as soon as possible or go to the Emergency Department if any problems should occur.  Please show the CHEMOTHERAPY ALERT CARD or  IMMUNOTHERAPY ALERT CARD at check-in to the Emergency Department and triage nurse.  Should you have questions after your visit or need to cancel or reschedule your appointment, please contact [facility] - A DEPT OF [facility]  Dept: [phone]  and follow the prompts.  Office hours are 8:00 a.m. to 4:30 p.m. Monday - Friday. Please note that voicemails left after 4:00 p.m. may not be returned until the following business day.  We are closed weekends and major holidays. You have access to a nurse at all times for urgent questions. Please call the main number to the clinic Dept: [phone] and follow the prompts.   For any non-urgent questions, you may also contact your provider using MyChart. We now offer e-Visits for anyone 61 and older to request care online for non-urgent symptoms. For details visit [email or web address].   Also download the MyChart app! Go to the app store, search "MyChart", open the app, select , and log in with your MyChart username and password.

## 2024-02-19 NOTE — Progress Notes (Signed)
 Palliative Medicine [facility]  Telephone:(336) [phone] Fax:(336) [phone]   Name: Samuel [name] Date: 02/19/2024 MRN: [id number]  DOB: 1966/05/24  Patient Care Team: Hoy Register, MD as PCP - General (Family Medicine) Pickenpack-Cousar, Arty Baumgartner, NP as Nurse Practitioner (Nurse Practitioner)    INTERVAL HISTORY: Samuel [name] is a 58 y.o. 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  Palliative ask to see for symptom management and goals of care.   SOCIAL HISTORY:     reports that he has been smoking cigarettes. He started smoking about 15 months ago. He has a 0.6 pack-year smoking history. He has never used smokeless tobacco. He reports current alcohol use of about 2.0 standard drinks of alcohol per week. He reports that he does not use drugs.  ADVANCE DIRECTIVES:  Patient does not have an advanced directives. Education and packet provided. He has expressed interest in completing. States his sister, Samuel [name] would be his designated Clinical research associate.   CODE STATUS: Full code  PAST MEDICAL HISTORY: Past Medical History:  Diagnosis Date   Alcohol use disorder, severe, dependence (HCC) 12/26/2016   Anxiety    Depression    Diabetes mellitus without complication (HCC)    Metastatic cancer to brain (HCC) 08/05/2022   Metastatic cancer to spine (HCC) 07/19/2022   Pancreatitis unk   Primary lung adenocarcinoma (HCC) 08/05/2022   Suicidal ideation    Type 2 diabetes mellitus with hyperglycemia, without long-term current use of insulin (HCC) 06/13/2022   Formatting of this note might be different from the original. 06/13/2022 A1C 13.8, FSBG 414 Start empagliflozin 5mg /metformin 1000 daily, levemir 20 qhs. (Samples given) Will apply for medassist for pharmacy    ALLERGIES:  is allergic to penicillins and percocet  [oxycodone-acetaminophen].  MEDICATIONS:  Current Outpatient Medications  Medication Sig Dispense Refill   Accu-Chek Softclix Lancets lancets Use to check blood sugar 3 times daily. E11.69 100 each 6   acetaminophen (TYLENOL) 500 MG tablet Take 1,000 mg by mouth every 6 (six) hours as needed for moderate pain (pain score 4-6).     atorvastatin (LIPITOR) 40 MG tablet Take 1 tablet (40 mg total) by mouth daily. 90 tablet 1   buPROPion (WELLBUTRIN XL) 150 MG 24 hr tablet Take 1 tablet (150 mg total) by mouth daily. 90 tablet 1   cetirizine (ZYRTEC) 10 MG tablet Take 1 tablet (10 mg total) by mouth daily. 30 tablet 1   Continuous Glucose Receiver (FREESTYLE LIBRE 3 READER) DEVI Use to check blood sugar continuously throughout the day. 1 each 0   Continuous Glucose Sensor (FREESTYLE LIBRE 3 PLUS SENSOR) MISC Change sensor every 15 days. Use to check blood sugar continuously. 2 each 6   doxycycline (VIBRA-TABS) 100 MG tablet Take 1 tablet (100 mg total) by mouth 2 (two) times daily. 20 tablet 0   empagliflozin (JARDIANCE) 25 MG TABS tablet Take 1 tablet (25 mg total) by mouth daily before breakfast. 90 tablet 1   fluticasone (FLONASE) 50 MCG/ACT nasal spray Place 2 sprays into both nostrils daily. 16 g 1   folic acid (FOLVITE) 1 MG tablet Take 1 tablet (1 mg total) by mouth daily. 30 tablet 4   gabapentin (NEURONTIN) 300 MG capsule Take 2 capsules (600 mg total) by mouth 3 (three) times daily. 180 capsule 1   glucose blood (ACCU-CHEK GUIDE TEST) test strip Use  as instructed twice daily 100 each 12   glucose blood (ACCU-CHEK GUIDE) test strip Use to check blood sugar 3 times daily. E11.69 100 each 6   insulin glargine (LANTUS SOLOSTAR) 100 UNIT/ML Solostar Pen Inject 50 Units into the skin daily. 30 mL 11   Insulin Pen Needle 32G X 4 MM MISC Use with Insulin pen 100 each 3   oxyCODONE (OXYCONTIN) 10 mg 12 hr tablet Take 1 tablet (10 mg total) by mouth every 12 (twelve) hours. 60 tablet 0   Oxycodone  HCl 10 MG TABS Take 1 tablet (10 mg) by mouth every 6 hours as needed for severe pain. 60 tablet 0   tadalafil (CIALIS) 10 MG tablet Take 1 tablet (10 mg total) by mouth every other day as needed for erectile dysfunction. 10 tablet 2   No current facility-administered medications for this visit.   VITAL SIGNS: There were no vitals taken for this visit. There were no vitals filed for this visit.  Estimated body mass index is 24.01 kg/m as calculated from the following:   Height as of an earlier encounter on 02/19/24: 5\' 8"  (1.727 m).   Weight as of an earlier encounter on 02/19/24: 157 lb 14.4 oz (71.6 kg).   PERFORMANCE STATUS (ECOG) : 1 - Symptomatic but completely ambulatory  Physical Exam General: NAD Cardiovascular: regular rate and rhythm Pulmonary: normal breathing pattern Extremities: no edema, no joint deformities Skin: no rashes Neurological: AAO x3  IMPRESSION: Discussed the use of AI scribe software for clinical note transcription with the patient, who gave verbal consent to proceed.  History of Present Illness Samuel [name] is a 58 year old male with history of non-small cell lung cancer, diabetes and chronic cancer related pain who presents for symptom management follow-up. No acute distress noted. Denies concerns with nausea, vomiting, constipation, or diarrhea. Appetite is good. He is remaining active.   He has been experiencing recent allergy symptoms, which have been managed by his PCP.  He was prescribed doxycycline and Flonase by his primary care provider. He uses Flonase two sprays in the morning and takes Zyrtec once a day. There is noted improvement in symptoms, including reduced sneezing and tearing of the eyes, although some dryness is experienced.  He is scheduled for back surgery on April 8th, which was postponed due to illness. The surgery is expected to be outpatient, but he may stay for up to three days due to his diabetes and risk of infection. He has  not had surgery before and expresses some concern about the procedure. Support provided.   We discussed his pain at length. Trust reports his pain is well controlled with oxycodone 10 mg every six hours as needed and Oxycontin every twelve hours. No adjustments to regimen at this time. Sleeping much better. He is taking melatonin as needed at bedtime.   I discussed the importance of continued conversation with family and their medical providers regarding overall plan of care and treatment options, ensuring decisions are within the context of the patients values and GOCs.  Assessment & Plan Upcoming Back Surgery Scheduled for back surgery on April 8th through neurosurgical services at [facility]. Consideration of diabetes and potential infection, with a plan for a possible two-day stay to monitor for complications per patient.  - Proceed with back surgery on April 8th as planned.   Cancer Related Pain Pain is well controlled with oxycodone 10 mg every six hours as needed and OxyContin every twelve hours. Reports adequate  pain control with current regimen. Emphasized adherence to medication schedule for consistent pain management. - Continue oxycodone 10 mg every six hours as needed - Continue OxyContin every twelve hours  Allergic Rhinitis Allergic rhinitis likely due to tree pollen exposure. Symptoms include sneezing, tearing of the eyes, and headaches. Reports improvement with Flonase and Zyrtec. Discussed daily use of Flonase, with option to reduce frequency if dryness occurs. Advised to use saline spray or Vaseline if dryness is problematic. - Continue Flonase two sprays in the morning - Continue Zyrtec once daily - Use saline spray or Vaseline if nasal dryness occurs  Follow-up Follow-up appointments scheduled to monitor progress post-surgery and ongoing management of chronic conditions. - Schedule follow-up appointment in 3-4 weeks. Sooner if needed.   Patient expressed understanding  and was in agreement with this plan. He also understands that He can call the clinic at any time with any questions, concerns, or complaints.   Any controlled substances utilized were prescribed in the context of palliative care. PDMP has been reviewed.   Visit consisted of counseling and education dealing with the complex and emotionally intense issues of symptom management and palliative care in the setting of serious and potentially life-threatening illness.  Willette Alma, AGPCNP-BC  Palliative Medicine Team/[facility]

## 2024-02-19 NOTE — Progress Notes (Signed)
 [facility] [phone]   Fax:(336) [phone]  OFFICE PROGRESS NOTE  Hoy Register, MD [address] [place] [address] [place] [address]  DIAGNOSIS: stage IV (T3a, N2, M1 C) non-small cell lung cancer, adenocarcinoma presented with right pulmonary nodules in addition to right hilar and mediastinal lymphadenopathy, pleural effusion, brain metastasis as well as extensive metastatic bone disease in the lumbar spines status post biopsy with kyphoplasty at the L2 lesion and brain metastasis diagnosed in September 2023.  Biomarker Findings Microsatellite status - MS-Stable Tumor Mutational Burden - 8 Muts/Mb Genomic Findings For a complete list of the genes assayed, please refer to the Appendix. KEAP1 Q227* CDKN2A/B [id number] G89V PRKCI amplification TERC amplification - equivocal? TP53 D263fs*18 8 Disease relevant genes with no reportable alterations: ALK, BRAF, EGFR, ERBB2, KRAS, MET, RET, ROS1  PDL1 TPS  90%  PRIOR THERAPY:  1) Status post palliative radiotherapy to the L2 lesion under the care of Dr. Kathrynn Running. 2) Status post SRS to solitary brain metastasis under the care of Dr. Kathrynn Running  CURRENT THERAPY: Palliative systemic therapy with carboplatin for AUC of 5, Alimta 500 Mg/M2 and Keytruda 200 Mg IV every 3 weeks.  First dose September 05, 2022.  Status post 25 cycles.  Starting from cycle #5 the patient will be on maintenance treatment with Alimta and Keytruda every 3 weeks.  INTERVAL HISTORY: Samuel [name] 58 y.o. male returns to the clinic today for follow-up visit.Discussed the use of AI scribe software for clinical note transcription with the patient, who gave verbal consent to proceed.  History of Present Illness   Samuel [name] is a 58 year old male with adenocarcinoma who presents for evaluation before starting cycle number twenty-six of his treatment.  He has adenocarcinoma diagnosed in September 2023 and has been undergoing  chemotherapy with carboplatin, Alimta, and Keytruda.  He denies experiencing any side effects from his current treatment regimen, including nausea, vomiting, or diarrhea.  He had to reschedule his back surgery due to illness, initially uncertain whether it was due to allergies or an infection.      MEDICAL HISTORY: Past Medical History:  Diagnosis Date   Alcohol use disorder, severe, dependence (HCC) 12/26/2016   Anxiety    Depression    Diabetes mellitus without complication (HCC)    Metastatic cancer to brain (HCC) 08/05/2022   Metastatic cancer to spine (HCC) 07/19/2022   Pancreatitis unk   Primary lung adenocarcinoma (HCC) 08/05/2022   Suicidal ideation    Type 2 diabetes mellitus with hyperglycemia, without long-term current use of insulin (HCC) 06/13/2022   Formatting of this note might be different from the original. 06/13/2022 A1C 13.8, FSBG 414 Start empagliflozin 5mg /metformin 1000 daily, levemir 20 qhs. (Samples given) Will apply for medassist for pharmacy    ALLERGIES:  is allergic to penicillins and percocet [oxycodone-acetaminophen].  MEDICATIONS:  Current Outpatient Medications  Medication Sig Dispense Refill   Accu-Chek Softclix Lancets lancets Use to check blood sugar 3 times daily. E11.69 100 each 6   acetaminophen (TYLENOL) 500 MG tablet Take 1,000 mg by mouth every 6 (six) hours as needed for moderate pain (pain score 4-6).     atorvastatin (LIPITOR) 40 MG tablet Take 1 tablet (40 mg total) by mouth daily. 90 tablet 1   buPROPion (WELLBUTRIN XL) 150 MG 24 hr tablet Take 1 tablet (150 mg total) by mouth daily. 90 tablet 1   cetirizine (ZYRTEC) 10 MG tablet Take 1 tablet (10  mg total) by mouth daily. 30 tablet 1   Continuous Glucose Receiver (FREESTYLE LIBRE 3 READER) DEVI Use to check blood sugar continuously throughout the day. 1 each 0   Continuous Glucose Sensor (FREESTYLE LIBRE 3 PLUS SENSOR) MISC Change sensor every 15 days. Use to check blood sugar  continuously. 2 each 6   doxycycline (VIBRA-TABS) 100 MG tablet Take 1 tablet (100 mg total) by mouth 2 (two) times daily. 20 tablet 0   empagliflozin (JARDIANCE) 25 MG TABS tablet Take 1 tablet (25 mg total) by mouth daily before breakfast. 90 tablet 1   fluticasone (FLONASE) 50 MCG/ACT nasal spray Place 2 sprays into both nostrils daily. 16 g 1   folic acid (FOLVITE) 1 MG tablet Take 1 tablet (1 mg total) by mouth daily. 30 tablet 4   gabapentin (NEURONTIN) 300 MG capsule Take 2 capsules (600 mg total) by mouth 3 (three) times daily. 180 capsule 1   glucose blood (ACCU-CHEK GUIDE TEST) test strip Use as instructed twice daily 100 each 12   glucose blood (ACCU-CHEK GUIDE) test strip Use to check blood sugar 3 times daily. E11.69 100 each 6   insulin glargine (LANTUS SOLOSTAR) 100 UNIT/ML Solostar Pen Inject 50 Units into the skin daily. 30 mL 11   Insulin Pen Needle 32G X 4 MM MISC Use with Insulin pen 100 each 3   oxyCODONE (OXYCONTIN) 10 mg 12 hr tablet Take 1 tablet (10 mg total) by mouth every 12 (twelve) hours. 60 tablet 0   Oxycodone HCl 10 MG TABS Take 1 tablet (10 mg) by mouth every 6 hours as needed for severe pain. 60 tablet 0   tadalafil (CIALIS) 10 MG tablet Take 1 tablet (10 mg total) by mouth every other day as needed for erectile dysfunction. 10 tablet 2   No current facility-administered medications for this visit.    SURGICAL HISTORY:  Past Surgical History:  Procedure Laterality Date   IR BONE TUMOR(S)RF ABLATION  08/01/2022   IR IMAGING GUIDED PORT INSERTION  09/21/2022   IR KYPHO LUMBAR INC FX REDUCE BONE BX UNI/BIL CANNULATION INC/IMAGING  08/01/2022    REVIEW OF SYSTEMS:  A comprehensive review of systems was negative.   PHYSICAL EXAMINATION: General appearance: alert, cooperative, and no distress Head: Normocephalic, without obvious abnormality, atraumatic Neck: no adenopathy, no JVD, supple, symmetrical, trachea midline, and thyroid not enlarged, symmetric, no  tenderness/mass/nodules Lymph nodes: Cervical, supraclavicular, and axillary nodes normal. Resp: clear to auscultation bilaterally Back: symmetric, no curvature. ROM normal. No CVA tenderness. Cardio: regular rate and rhythm, S1, S2 normal, no murmur, click, rub or gallop GI: soft, non-tender; bowel sounds normal; no masses,  no organomegaly Extremities: extremities normal, atraumatic, no cyanosis or edema  ECOG PERFORMANCE STATUS: 1 - Symptomatic but completely ambulatory  Blood pressure (!) 128/94, pulse 84, temperature 98.2 F (36.8 C), temperature source Temporal, resp. rate 17, height 5\' 8"  (1.727 m), weight 157 lb 14.4 oz (71.6 kg), SpO2 100%.  LABORATORY DATA: Lab Results  Component Value Date   WBC 5.5 02/19/2024   HGB 12.9 (L) 02/19/2024   HCT 38.0 (L) 02/19/2024   MCV 96.0 02/19/2024   PLT 246 02/19/2024      Chemistry      Component Value Date/Time   NA 135 01/29/2024 0745   NA 140 12/24/2020 1144   NA 142 08/02/2014 1649   K 4.0 01/29/2024 0745   K 4.2 08/02/2014 1649   CL 105 01/29/2024 0745   CL 109 (H) 08/02/2014  1649   CO2 26 01/29/2024 0745   CO2 21 08/02/2014 1649   BUN 12 01/29/2024 0745   BUN 7 12/24/2020 1144   BUN 11 08/02/2014 1649   CREATININE 0.62 01/29/2024 0745   CREATININE 0.92 08/02/2014 1649      Component Value Date/Time   CALCIUM 8.3 (L) 01/29/2024 0745   CALCIUM 8.6 08/02/2014 1649   ALKPHOS 82 01/29/2024 0745   ALKPHOS 82 08/02/2014 1649   AST 25 01/29/2024 0745   ALT 36 01/29/2024 0745   ALT 132 (H) 08/02/2014 1649   BILITOT 0.2 01/29/2024 0745       RADIOGRAPHIC STUDIES: No results found.    ASSESSMENT AND PLAN: This is a very pleasant 58 years old African-American male diagnosed with stage IV (T3a, N2, M1 C) non-small cell lung cancer, adenocarcinoma presented with right pulmonary nodules in addition to right hilar and mediastinal lymphadenopathy, pleural effusion, brain metastasis as well as extensive metastatic bone  disease in the lumbar spines status post biopsy with kyphoplasty at the L2 lesion and brain metastasis diagnosed in September 2023. He has molecular studies by foundation 1 that showed no actionable mutations and PD-L1 expression was 90%. He underwent palliative radiotherapy to the L2 vertebral body compression fraction as well as solitary brain metastasis with SRS. He is currently undergoing palliative systemic chemotherapy with carboplatin for AUC of 5, Alimta 500 Mg/M2 and Keytruda 200 Mg IV every 3 weeks.  Status post 25 cycles. Starting from cycle #5 he is on treatment with maintenance Alimta and Keytruda every 3 weeks.  He has been tolerating this treatment well with no concerning adverse effects.    Stage IV NSCLC, Adenocarcinoma Undergoing maintenance therapy with Alimta and Keytruda after initial chemotherapy with carboplatin, Alimta, and Keytruda. Completed 25 cycles with no reported side effects such as nausea, vomiting, or diarrhea. Lab work is satisfactory for proceeding with the next cycle. - Proceed with cycle 26 of Alimta and Keytruda. - Schedule follow-up appointment in three weeks. - Plan for imaging after the next treatment cycle, post-surgery recovery.  Back surgery Back surgery rescheduled to April 8th due to recent illness. Surgery to be performed by a neurologist. - Monitor recovery from back surgery scheduled for April 8th.    For the pain management, he is currently on MS Contin 15 mg p.o. twice daily in addition to Oxy IR for breakthrough pain and managed by the palliative care team.  The patient was advised to call immediately if he has any other concerning symptoms in the interval. The patient voices understanding of current disease status and treatment options and is in agreement with the current care plan.  All questions were answered. The patient knows to call the clinic with any problems, questions or concerns. We can certainly see the patient much sooner if  necessary.  The total time spent in the appointment was 20 minutes.  Disclaimer: This note was dictated with voice recognition software. Similar sounding words can inadvertently be transcribed and may not be corrected upon review.

## 2024-02-22 ENCOUNTER — Other Ambulatory Visit: Payer: Self-pay

## 2024-02-26 ENCOUNTER — Other Ambulatory Visit: Payer: Self-pay

## 2024-02-26 NOTE — Progress Notes (Signed)
 SDW CALL  Patient was given pre-op instructions over the phone. The opportunity was given for the patient to ask questions. No further questions asked. Patient verbalized understanding of instructions given.   PCP - Dr. Hoy Register Cardiologist - denies Oncologist - Dr. Janyth Contes  PPM/ICD - denies   Chest x-ray - 07/28/22 EKG - 08/28/23 Stress Test - denies ECHO - denies Cardiac Cath - denies  Sleep Study - denies   Fasting Blood Sugar - 80-110 Checks Blood Sugar __3-4___ times a day  Last dose of GLP1 agonist-  denies GLP1 instructions: denies  Patient states that he is now taking 20 units of Lantus in the morning and at night.  Patient instructed to take 10 units of Lantus tonight and 10 units in the morning.  Patient is aware to check his blood sugar every 2 hours and if he has a reading less than 70, he will need to drink 1/2 cup of apple juice and recheck in 15 minutes.  Patient stated he understood.  Patient was given NPO instructions.  Patient was told that if he did not follow instructions, there is a chance that his surgery would be canceled.     Blood Thinner Instructions: n/a Aspirin Instructions: n/a  ERAS Protcol - NPO   COVID TEST- n/a   Anesthesia review:  yes   Patient denies shortness of breath, fever, cough and chest pain over the phone call   All instructions explained to the patient, with a verbal understanding of the material. Patient agrees to go over the instructions while at home for a better understanding.

## 2024-02-26 NOTE — Progress Notes (Signed)
 Anesthesia follow-up: See previous Anesthesia APP note by Zannie Cove from Date of Service 01/30/24. Surgery was initially scheduled for 02/06/24, but postponed due to URI illness, s/p doxycycline on 02/06/24, Zyrtec, Flonase. He reported recovery of symptoms.   Since then, he also had oncology follow-up with Dr. Arbutus Ped. He had cycle 26 of Alimta and Keytruda. He will plan for repeat imaging after he recovers from back surgery. At that time, pain management per Palliative Care Team was MS Contin 15 mg p.o. twice daily in addition to Oxy IR for breakthrough pain.  He reported fasting glucose readings ~ 80-100.   Anesthesia team to evaluate on the day of surgery.   Shonna Chock, PA-C Surgical Short Stay/Anesthesiology [facility]: [place] [address] W Phone [phone] [facility] Phone [phone] 02/26/2024 12:22 PM

## 2024-02-27 ENCOUNTER — Inpatient Hospital Stay (HOSPITAL_COMMUNITY)
Admission: RE | Admit: 2024-02-27 | Discharge: 2024-02-28 | DRG: 519 | Disposition: A | Discharge status: Home / Self Care | Payer: Medicaid Other | Attending: Neurosurgery | Admitting: Neurosurgery

## 2024-02-27 ENCOUNTER — Other Ambulatory Visit: Payer: Self-pay

## 2024-02-27 ENCOUNTER — Inpatient Hospital Stay (HOSPITAL_COMMUNITY)

## 2024-02-27 ENCOUNTER — Encounter (HOSPITAL_COMMUNITY): Payer: Self-pay | Admitting: Neurosurgery

## 2024-02-27 ENCOUNTER — Inpatient Hospital Stay (HOSPITAL_COMMUNITY): Payer: Self-pay | Admitting: Physician Assistant

## 2024-02-27 ENCOUNTER — Inpatient Hospital Stay (HOSPITAL_COMMUNITY)
Admission: RE | Disposition: A | Discharge status: Home / Self Care | Payer: Self-pay | Source: Home / Self Care | Attending: Neurosurgery

## 2024-02-27 ENCOUNTER — Inpatient Hospital Stay (HOSPITAL_COMMUNITY): Payer: Self-pay | Admitting: Vascular Surgery

## 2024-02-27 DIAGNOSIS — F418 Other specified anxiety disorders: Secondary | ICD-10-CM

## 2024-02-27 DIAGNOSIS — Z79899 Other long term (current) drug therapy: Secondary | ICD-10-CM

## 2024-02-27 DIAGNOSIS — M40205 Unspecified kyphosis, thoracolumbar region: Secondary | ICD-10-CM | POA: Diagnosis present

## 2024-02-27 DIAGNOSIS — F419 Anxiety disorder, unspecified: Secondary | ICD-10-CM | POA: Diagnosis present

## 2024-02-27 DIAGNOSIS — F1020 Alcohol dependence, uncomplicated: Secondary | ICD-10-CM | POA: Diagnosis present

## 2024-02-27 DIAGNOSIS — C7951 Secondary malignant neoplasm of bone: Secondary | ICD-10-CM | POA: Diagnosis present

## 2024-02-27 DIAGNOSIS — Z7984 Long term (current) use of oral hypoglycemic drugs: Secondary | ICD-10-CM

## 2024-02-27 DIAGNOSIS — Z9151 Personal history of suicidal behavior: Secondary | ICD-10-CM

## 2024-02-27 DIAGNOSIS — C801 Malignant (primary) neoplasm, unspecified: Secondary | ICD-10-CM

## 2024-02-27 DIAGNOSIS — Z794 Long term (current) use of insulin: Secondary | ICD-10-CM | POA: Diagnosis not present

## 2024-02-27 DIAGNOSIS — M48062 Spinal stenosis, lumbar region with neurogenic claudication: Secondary | ICD-10-CM | POA: Diagnosis present

## 2024-02-27 DIAGNOSIS — M8448XA Pathological fracture, other site, initial encounter for fracture: Principal | ICD-10-CM | POA: Diagnosis present

## 2024-02-27 DIAGNOSIS — Z85118 Personal history of other malignant neoplasm of bronchus and lung: Secondary | ICD-10-CM

## 2024-02-27 DIAGNOSIS — Z515 Encounter for palliative care: Secondary | ICD-10-CM

## 2024-02-27 DIAGNOSIS — Z885 Allergy status to narcotic agent status: Secondary | ICD-10-CM | POA: Diagnosis not present

## 2024-02-27 DIAGNOSIS — Z880 Allergy status to penicillin: Secondary | ICD-10-CM

## 2024-02-27 DIAGNOSIS — Z9889 Other specified postprocedural states: Secondary | ICD-10-CM | POA: Diagnosis not present

## 2024-02-27 DIAGNOSIS — Z8719 Personal history of other diseases of the digestive system: Secondary | ICD-10-CM

## 2024-02-27 DIAGNOSIS — E119 Type 2 diabetes mellitus without complications: Secondary | ICD-10-CM | POA: Diagnosis present

## 2024-02-27 DIAGNOSIS — F32A Depression, unspecified: Secondary | ICD-10-CM | POA: Diagnosis present

## 2024-02-27 DIAGNOSIS — M48061 Spinal stenosis, lumbar region without neurogenic claudication: Secondary | ICD-10-CM | POA: Diagnosis present

## 2024-02-27 DIAGNOSIS — C3490 Malignant neoplasm of unspecified part of unspecified bronchus or lung: Secondary | ICD-10-CM

## 2024-02-27 DIAGNOSIS — K219 Gastro-esophageal reflux disease without esophagitis: Secondary | ICD-10-CM | POA: Diagnosis present

## 2024-02-27 DIAGNOSIS — C7931 Secondary malignant neoplasm of brain: Secondary | ICD-10-CM | POA: Diagnosis present

## 2024-02-27 DIAGNOSIS — F17210 Nicotine dependence, cigarettes, uncomplicated: Secondary | ICD-10-CM | POA: Diagnosis present

## 2024-02-27 DIAGNOSIS — M8458XA Pathological fracture in neoplastic disease, other specified site, initial encounter for fracture: Secondary | ICD-10-CM | POA: Diagnosis present

## 2024-02-27 DIAGNOSIS — M792 Neuralgia and neuritis, unspecified: Secondary | ICD-10-CM

## 2024-02-27 DIAGNOSIS — G893 Neoplasm related pain (acute) (chronic): Secondary | ICD-10-CM

## 2024-02-27 HISTORY — PX: LAMINECTOMY WITH POSTERIOR LATERAL ARTHRODESIS LEVEL 4: SHX6338

## 2024-02-27 HISTORY — PX: APPLICATION OF ROBOTIC ASSISTANCE FOR SPINAL PROCEDURE: SHX6753

## 2024-02-27 LAB — TYPE AND SCREEN
ABO/RH(D): A POS
Antibody Screen: NEGATIVE

## 2024-02-27 LAB — GLUCOSE, CAPILLARY
Glucose-Capillary: 97 mg/dL (ref 70–99)
Glucose-Capillary: 94 mg/dL (ref 70–99)
Glucose-Capillary: 105 mg/dL — ABNORMAL HIGH (ref 70–99)
Glucose-Capillary: 129 mg/dL — ABNORMAL HIGH (ref 70–99)

## 2024-02-27 LAB — HEMOGLOBIN A1C
Hgb A1c MFr Bld: 8.7 % — ABNORMAL HIGH (ref 4.8–5.6)
Mean Plasma Glucose: 202.99 mg/dL

## 2024-02-27 SURGERY — LAMINECTOMY WITH POSTERIOR LATERAL ARTHRODESIS LEVEL 4
Anesthesia: General | Site: Back

## 2024-02-27 MED ORDER — KETAMINE HCL 50 MG/5ML IJ SOSY
PREFILLED_SYRINGE | INTRAMUSCULAR | Status: AC
  Filled 2024-02-27: qty 5

## 2024-02-27 MED ORDER — THROMBIN 20000 UNITS EX SOLR
CUTANEOUS | Status: AC
Start: 2024-02-27 — End: ?
  Filled 2024-02-27: qty 20000

## 2024-02-27 MED ORDER — FENTANYL CITRATE (PF) 100 MCG/2ML IJ SOLN
25.0000 ug | INTRAMUSCULAR | Status: DC | PRN
  Administered 2024-02-27 (×2): 50 ug via INTRAVENOUS

## 2024-02-27 MED ORDER — DOCUSATE SODIUM 100 MG PO CAPS
100.0000 mg | ORAL_CAPSULE | Freq: Two times a day (BID) | ORAL | Status: DC
  Administered 2024-02-27: 100 mg via ORAL
  Filled 2024-02-27: qty 1

## 2024-02-27 MED ORDER — DOXYCYCLINE HYCLATE 100 MG PO TABS
100.0000 mg | ORAL_TABLET | Freq: Two times a day (BID) | ORAL | Status: DC
  Administered 2024-02-27: 100 mg via ORAL
  Filled 2024-02-27: qty 1

## 2024-02-27 MED ORDER — LIDOCAINE 2% (20 MG/ML) 5 ML SYRINGE
INTRAMUSCULAR | Status: AC
  Filled 2024-02-27: qty 5

## 2024-02-27 MED ORDER — OXYCODONE HCL ER 10 MG PO T12A
10.0000 mg | EXTENDED_RELEASE_TABLET | Freq: Two times a day (BID) | ORAL | Status: DC
  Administered 2024-02-27: 10 mg via ORAL
  Filled 2024-02-27: qty 1

## 2024-02-27 MED ORDER — DEXAMETHASONE SODIUM PHOSPHATE 10 MG/ML IJ SOLN
INTRAMUSCULAR | Status: DC | PRN
  Administered 2024-02-27: 10 mg via INTRAVENOUS

## 2024-02-27 MED ORDER — INSULIN ASPART 100 UNIT/ML IJ SOLN: 0.0000 [IU] | Freq: Three times a day (TID) | INTRAMUSCULAR | Status: DC

## 2024-02-27 MED ORDER — PHENYLEPHRINE 80 MCG/ML (10ML) SYRINGE FOR IV PUSH (FOR BLOOD PRESSURE SUPPORT)
PREFILLED_SYRINGE | INTRAVENOUS | Status: DC | PRN
  Administered 2024-02-27 (×2): 160 ug via INTRAVENOUS
  Administered 2024-02-27: 80 ug via INTRAVENOUS
  Administered 2024-02-27 (×2): 160 ug via INTRAVENOUS
  Administered 2024-02-27: 80 ug via INTRAVENOUS

## 2024-02-27 MED ORDER — INSULIN ASPART 100 UNIT/ML IJ SOLN: 0.0000 [IU] | INTRAMUSCULAR | Status: DC | PRN

## 2024-02-27 MED ORDER — PHENOL 1.4 % MT LIQD: 1.0000 | OROMUCOSAL | Status: DC | PRN

## 2024-02-27 MED ORDER — 0.9 % SODIUM CHLORIDE (POUR BTL) OPTIME
TOPICAL | Status: DC | PRN
  Administered 2024-02-27: 1000 mL

## 2024-02-27 MED ORDER — KETAMINE HCL 10 MG/ML IJ SOLN
INTRAMUSCULAR | Status: DC | PRN
  Administered 2024-02-27 (×2): 10 mg via INTRAVENOUS
  Administered 2024-02-27: 20 mg via INTRAVENOUS

## 2024-02-27 MED ORDER — BUPIVACAINE HCL (PF) 0.5 % IJ SOLN
INTRAMUSCULAR | Status: AC
  Filled 2024-02-27: qty 30

## 2024-02-27 MED ORDER — METHOCARBAMOL 1000 MG/10ML IJ SOLN: 500.0000 mg | Freq: Four times a day (QID) | INTRAMUSCULAR | Status: DC | PRN

## 2024-02-27 MED ORDER — INSULIN GLARGINE-YFGN 100 UNIT/ML ~~LOC~~ SOLN
50.0000 [IU] | Freq: Every day | SUBCUTANEOUS | Status: DC
  Filled 2024-02-27: qty 0.5

## 2024-02-27 MED ORDER — FENTANYL CITRATE (PF) 100 MCG/2ML IJ SOLN
INTRAMUSCULAR | Status: AC
  Filled 2024-02-27: qty 2

## 2024-02-27 MED ORDER — DEXMEDETOMIDINE HCL IN NACL 80 MCG/20ML IV SOLN
INTRAVENOUS | Status: DC | PRN
  Administered 2024-02-27 (×2): 8 ug via INTRAVENOUS

## 2024-02-27 MED ORDER — SODIUM CHLORIDE 0.9 % IV SOLN
250.0000 mL | INTRAVENOUS | Status: DC
  Administered 2024-02-27: 250 mL via INTRAVENOUS

## 2024-02-27 MED ORDER — THROMBIN 5000 UNITS EX KIT
PACK | CUTANEOUS | Status: AC
  Filled 2024-02-27: qty 1

## 2024-02-27 MED ORDER — ROCURONIUM BROMIDE 10 MG/ML (PF) SYRINGE
PREFILLED_SYRINGE | INTRAVENOUS | Status: AC
  Filled 2024-02-27: qty 10

## 2024-02-27 MED ORDER — CHLORHEXIDINE GLUCONATE CLOTH 2 % EX PADS: 6.0000 | MEDICATED_PAD | Freq: Once | CUTANEOUS | Status: DC

## 2024-02-27 MED ORDER — EMPAGLIFLOZIN 25 MG PO TABS
25.0000 mg | ORAL_TABLET | Freq: Every day | ORAL | Status: DC
  Administered 2024-02-28: 25 mg via ORAL
  Filled 2024-02-27: qty 1

## 2024-02-27 MED ORDER — LIDOCAINE-EPINEPHRINE 1 %-1:100000 IJ SOLN
INTRAMUSCULAR | Status: AC
  Filled 2024-02-27: qty 1

## 2024-02-27 MED ORDER — PHENYLEPHRINE HCL-NACL 20-0.9 MG/250ML-% IV SOLN
INTRAVENOUS | Status: DC | PRN
  Administered 2024-02-27: 40 ug/min via INTRAVENOUS

## 2024-02-27 MED ORDER — SENNA 8.6 MG PO TABS
1.0000 | ORAL_TABLET | Freq: Two times a day (BID) | ORAL | Status: DC
  Administered 2024-02-27: 8.6 mg via ORAL
  Filled 2024-02-27: qty 1

## 2024-02-27 MED ORDER — FENTANYL CITRATE (PF) 250 MCG/5ML IJ SOLN
INTRAMUSCULAR | Status: DC | PRN
  Administered 2024-02-27: 100 ug via INTRAVENOUS
  Administered 2024-02-27 (×2): 25 ug via INTRAVENOUS
  Administered 2024-02-27 (×2): 50 ug via INTRAVENOUS

## 2024-02-27 MED ORDER — INSULIN GLARGINE-YFGN 100 UNIT/ML ~~LOC~~ SOLN
20.0000 [IU] | Freq: Two times a day (BID) | SUBCUTANEOUS | Status: DC
  Administered 2024-02-28: 20 [IU] via SUBCUTANEOUS
  Filled 2024-02-27 (×3): qty 0.2

## 2024-02-27 MED ORDER — FENTANYL CITRATE (PF) 250 MCG/5ML IJ SOLN
INTRAMUSCULAR | Status: AC
  Filled 2024-02-27: qty 5

## 2024-02-27 MED ORDER — PROPOFOL 10 MG/ML IV BOLUS
INTRAVENOUS | Status: AC
  Filled 2024-02-27: qty 20

## 2024-02-27 MED ORDER — DEXAMETHASONE SODIUM PHOSPHATE 10 MG/ML IJ SOLN
INTRAMUSCULAR | Status: AC
  Filled 2024-02-27: qty 1

## 2024-02-27 MED ORDER — OXYCODONE HCL 5 MG PO TABS
10.0000 mg | ORAL_TABLET | Freq: Four times a day (QID) | ORAL | Status: DC | PRN
  Administered 2024-02-27 – 2024-02-28 (×3): 10 mg via ORAL
  Filled 2024-02-27 (×3): qty 2

## 2024-02-27 MED ORDER — SUGAMMADEX SODIUM 200 MG/2ML IV SOLN
INTRAVENOUS | Status: DC | PRN
  Administered 2024-02-27: 150 mg via INTRAVENOUS

## 2024-02-27 MED ORDER — ONDANSETRON HCL 4 MG/2ML IJ SOLN
INTRAMUSCULAR | Status: DC | PRN
  Administered 2024-02-27: 4 mg via INTRAVENOUS

## 2024-02-27 MED ORDER — LORATADINE 10 MG PO TABS
10.0000 mg | ORAL_TABLET | Freq: Every day | ORAL | Status: DC
  Administered 2024-02-27: 10 mg via ORAL
  Filled 2024-02-27: qty 1

## 2024-02-27 MED ORDER — ORAL CARE MOUTH RINSE: 15.0000 mL | Freq: Once | OROMUCOSAL | Status: AC

## 2024-02-27 MED ORDER — FLUTICASONE PROPIONATE 50 MCG/ACT NA SUSP
2.0000 | Freq: Every day | NASAL | Status: DC
  Filled 2024-02-27: qty 16

## 2024-02-27 MED ORDER — ACETAMINOPHEN 10 MG/ML IV SOLN
INTRAVENOUS | Status: AC
  Filled 2024-02-27: qty 100

## 2024-02-27 MED ORDER — CEFAZOLIN SODIUM-DEXTROSE 2-4 GM/100ML-% IV SOLN
2.0000 g | Freq: Three times a day (TID) | INTRAVENOUS | Status: AC
  Administered 2024-02-27 – 2024-02-28 (×2): 2 g via INTRAVENOUS
  Filled 2024-02-27 (×2): qty 100

## 2024-02-27 MED ORDER — FOLIC ACID 1 MG PO TABS
1.0000 mg | ORAL_TABLET | Freq: Every day | ORAL | Status: DC
  Administered 2024-02-27: 1 mg via ORAL
  Filled 2024-02-27: qty 1

## 2024-02-27 MED ORDER — MENTHOL 3 MG MT LOZG: 1.0000 | LOZENGE | OROMUCOSAL | Status: DC | PRN

## 2024-02-27 MED ORDER — GABAPENTIN 300 MG PO CAPS
600.0000 mg | ORAL_CAPSULE | Freq: Three times a day (TID) | ORAL | Status: DC
Start: 2024-02-27 — End: 2024-02-28
  Administered 2024-02-27: 600 mg via ORAL
  Filled 2024-02-27: qty 2

## 2024-02-27 MED ORDER — ONDANSETRON HCL 4 MG/2ML IJ SOLN
INTRAMUSCULAR | Status: AC
  Filled 2024-02-27: qty 2

## 2024-02-27 MED ORDER — LACTATED RINGERS IV SOLN: INTRAVENOUS | Status: DC

## 2024-02-27 MED ORDER — ALBUMIN HUMAN 5 % IV SOLN: INTRAVENOUS | Status: DC | PRN

## 2024-02-27 MED ORDER — THROMBIN 5000 UNITS EX SOLR
OROMUCOSAL | Status: DC | PRN
  Administered 2024-02-27 (×2): 5 mL via TOPICAL

## 2024-02-27 MED ORDER — DEXMEDETOMIDINE HCL IN NACL 80 MCG/20ML IV SOLN
INTRAVENOUS | Status: AC
  Filled 2024-02-27: qty 20

## 2024-02-27 MED ORDER — BISACODYL 10 MG RE SUPP: 10.0000 mg | Freq: Every day | RECTAL | Status: DC | PRN

## 2024-02-27 MED ORDER — LIDOCAINE 2% (20 MG/ML) 5 ML SYRINGE
INTRAMUSCULAR | Status: DC | PRN
  Administered 2024-02-27: 60 mg via INTRAVENOUS

## 2024-02-27 MED ORDER — ROCURONIUM BROMIDE 10 MG/ML (PF) SYRINGE
PREFILLED_SYRINGE | INTRAVENOUS | Status: DC | PRN
  Administered 2024-02-27: 30 mg via INTRAVENOUS
  Administered 2024-02-27: 10 mg via INTRAVENOUS
  Administered 2024-02-27: 20 mg via INTRAVENOUS
  Administered 2024-02-27: 60 mg via INTRAVENOUS

## 2024-02-27 MED ORDER — BUPIVACAINE HCL (PF) 0.5 % IJ SOLN
INTRAMUSCULAR | Status: DC | PRN
  Administered 2024-02-27: 9 mL

## 2024-02-27 MED ORDER — ONDANSETRON HCL 4 MG PO TABS: 4.0000 mg | ORAL_TABLET | Freq: Four times a day (QID) | ORAL | Status: DC | PRN

## 2024-02-27 MED ORDER — CHLORHEXIDINE GLUCONATE 0.12 % MT SOLN
15.0000 mL | Freq: Once | OROMUCOSAL | Status: AC
  Administered 2024-02-27: 15 mL via OROMUCOSAL
  Filled 2024-02-27: qty 15

## 2024-02-27 MED ORDER — PROPOFOL 10 MG/ML IV BOLUS
INTRAVENOUS | Status: DC | PRN
  Administered 2024-02-27: 130 mg via INTRAVENOUS

## 2024-02-27 MED ORDER — MIDAZOLAM HCL 2 MG/2ML IJ SOLN
INTRAMUSCULAR | Status: AC
  Filled 2024-02-27: qty 2

## 2024-02-27 MED ORDER — VANCOMYCIN HCL IN DEXTROSE 1-5 GM/200ML-% IV SOLN
1000.0000 mg | INTRAVENOUS | Status: AC
  Administered 2024-02-27: 1000 mg via INTRAVENOUS
  Filled 2024-02-27: qty 200

## 2024-02-27 MED ORDER — ONDANSETRON HCL 4 MG/2ML IJ SOLN: 4.0000 mg | Freq: Four times a day (QID) | INTRAMUSCULAR | Status: DC | PRN

## 2024-02-27 MED ORDER — SODIUM CHLORIDE 0.9% FLUSH: 3.0000 mL | INTRAVENOUS | Status: DC | PRN

## 2024-02-27 MED ORDER — ACETAMINOPHEN 10 MG/ML IV SOLN
INTRAVENOUS | Status: DC | PRN
  Administered 2024-02-27: 1000 mg via INTRAVENOUS

## 2024-02-27 MED ORDER — PANTOPRAZOLE SODIUM 40 MG IV SOLR: 40.0000 mg | Freq: Every day | INTRAVENOUS | Status: DC

## 2024-02-27 MED ORDER — METHOCARBAMOL 500 MG PO TABS
500.0000 mg | ORAL_TABLET | Freq: Four times a day (QID) | ORAL | Status: DC | PRN
  Administered 2024-02-27 – 2024-02-28 (×2): 500 mg via ORAL
  Filled 2024-02-27 (×2): qty 1

## 2024-02-27 MED ORDER — ATORVASTATIN CALCIUM 40 MG PO TABS
40.0000 mg | ORAL_TABLET | Freq: Every day | ORAL | Status: DC
  Administered 2024-02-27: 40 mg via ORAL
  Filled 2024-02-27: qty 1

## 2024-02-27 MED ORDER — PANTOPRAZOLE SODIUM 40 MG PO TBEC
40.0000 mg | DELAYED_RELEASE_TABLET | Freq: Every day | ORAL | Status: DC
  Administered 2024-02-27: 40 mg via ORAL
  Filled 2024-02-27: qty 1

## 2024-02-27 MED ORDER — LIDOCAINE-EPINEPHRINE 1 %-1:100000 IJ SOLN
INTRAMUSCULAR | Status: DC | PRN
  Administered 2024-02-27: 9 mL

## 2024-02-27 MED ORDER — SODIUM CHLORIDE 0.9% FLUSH: 3.0000 mL | Freq: Two times a day (BID) | INTRAVENOUS | Status: DC

## 2024-02-27 MED ORDER — MORPHINE SULFATE (PF) 2 MG/ML IV SOLN
2.0000 mg | INTRAVENOUS | Status: DC | PRN
  Administered 2024-02-27 (×2): 2 mg via INTRAVENOUS
  Filled 2024-02-27 (×2): qty 1

## 2024-02-27 MED ORDER — MIDAZOLAM HCL 2 MG/2ML IJ SOLN
INTRAMUSCULAR | Status: DC | PRN
  Administered 2024-02-27: 2 mg via INTRAVENOUS

## 2024-02-27 MED ORDER — BUPROPION HCL ER (XL) 150 MG PO TB24
150.0000 mg | ORAL_TABLET | Freq: Every day | ORAL | Status: DC
  Administered 2024-02-27: 150 mg via ORAL
  Filled 2024-02-27: qty 1

## 2024-02-27 SURGICAL SUPPLY — 72 items
BAG COUNTER SPONGE SURGICOUNT (BAG) ×4 IMPLANT
BASKET BONE COLLECTION (BASKET) ×2 IMPLANT
BENZOIN TINCTURE PRP APPL 2/3 (GAUZE/BANDAGES/DRESSINGS) IMPLANT
BIT DRILL LONG 3.0X30 (BIT) IMPLANT
BIT DRILL LONG 3X80 (BIT) IMPLANT
BIT DRILL LONG 4X80 (BIT) IMPLANT
BIT DRILL SHORT 3.0X30 (BIT) IMPLANT
BIT DRILL SHORT 3X80 (BIT) IMPLANT
BIT DRILL TWIST 3X30 (BIT) IMPLANT
BLADE BONE MILL MEDIUM (MISCELLANEOUS) ×2 IMPLANT
BLADE CLIPPER SURG (BLADE) IMPLANT
BLADE SURG 11 STRL SS (BLADE) ×4 IMPLANT
BUR MATCHSTICK NEURO 3.0 LAGG (BURR) ×2 IMPLANT
BUR PRECISION FLUTE 5.0 (BURR) ×2 IMPLANT
CANISTER SUCT 3000ML PPV (MISCELLANEOUS) ×2 IMPLANT
CNTNR URN SCR LID CUP LEK RST (MISCELLANEOUS) ×2 IMPLANT
COVER BACK TABLE 60X90IN (DRAPES) ×2 IMPLANT
DERMABOND ADVANCED .7 DNX12 (GAUZE/BANDAGES/DRESSINGS) ×2 IMPLANT
DRAPE C-ARM 42X72 X-RAY (DRAPES) ×2 IMPLANT
DRAPE C-ARMOR (DRAPES) ×2 IMPLANT
DRAPE LAPAROTOMY 100X72X124 (DRAPES) ×2 IMPLANT
DRAPE SHEET LG 3/4 BI-LAMINATE (DRAPES) ×2 IMPLANT
DRAPE SURG 17X23 STRL (DRAPES) ×2 IMPLANT
DRESSING PEEL AND PLAC PRVNA20 (GAUZE/BANDAGES/DRESSINGS) IMPLANT
DRSG PEEL AND PLACE PREVENA 20 (GAUZE/BANDAGES/DRESSINGS) ×2 IMPLANT
DURAPREP 26ML APPLICATOR (WOUND CARE) ×2 IMPLANT
ELECT REM PT RETURN 9FT ADLT (ELECTROSURGICAL) ×2 IMPLANT
ELECTRODE REM PT RTRN 9FT ADLT (ELECTROSURGICAL) ×2 IMPLANT
EXTENDER TAB GUIDE SV 5.5/6.0 (INSTRUMENTS) IMPLANT
GAUZE 4X4 16PLY ~~LOC~~+RFID DBL (SPONGE) IMPLANT
GAUZE SPONGE 4X4 12PLY STRL (GAUZE/BANDAGES/DRESSINGS) IMPLANT
GLOVE BIO SURGEON STRL SZ7 (GLOVE) IMPLANT
GLOVE BIOGEL PI IND STRL 7.0 (GLOVE) IMPLANT
GLOVE BIOGEL PI IND STRL 7.5 (GLOVE) ×4 IMPLANT
GLOVE ECLIPSE 7.0 STRL STRAW (GLOVE) ×4 IMPLANT
GLOVE EXAM NITRILE XL STR (GLOVE) IMPLANT
GOWN STRL REUS W/ TWL LRG LVL3 (GOWN DISPOSABLE) ×8 IMPLANT
GOWN STRL REUS W/ TWL XL LVL3 (GOWN DISPOSABLE) IMPLANT
GOWN STRL REUS W/TWL 2XL LVL3 (GOWN DISPOSABLE) IMPLANT
HEMOSTAT POWDER KIT SURGIFOAM (HEMOSTASIS) ×2 IMPLANT
KIT BASIN OR (CUSTOM PROCEDURE TRAY) ×2 IMPLANT
KIT DRSG PREVENA PLUS 7DAY 125 (MISCELLANEOUS) IMPLANT
KIT POSITION SURG JACKSON T1 (MISCELLANEOUS) ×2 IMPLANT
KIT SPINE MAZOR X ROBO DISP (MISCELLANEOUS) ×2 IMPLANT
KIT TURNOVER KIT B (KITS) ×2 IMPLANT
MILL BONE PREP (MISCELLANEOUS) ×2 IMPLANT
NDL HYPO 18GX1.5 BLUNT FILL (NEEDLE) IMPLANT
NDL HYPO 22X1.5 SAFETY MO (MISCELLANEOUS) ×2 IMPLANT
NDL SPNL 18GX3.5 QUINCKE PK (NEEDLE) IMPLANT
NEEDLE HYPO 18GX1.5 BLUNT FILL (NEEDLE) IMPLANT
NEEDLE HYPO 22X1.5 SAFETY MO (MISCELLANEOUS) ×2 IMPLANT
NEEDLE SPNL 18GX3.5 QUINCKE PK (NEEDLE) IMPLANT
NS IRRIG 1000ML POUR BTL (IV SOLUTION) ×2 IMPLANT
PACK LAMINECTOMY NEURO (CUSTOM PROCEDURE TRAY) ×2 IMPLANT
PAD ARMBOARD POSITIONER FOAM (MISCELLANEOUS) ×6 IMPLANT
PIN HEAD 2.5X60MM (PIN) IMPLANT
ROD PERC STRT 5.5X140 (Rod) IMPLANT
SCREW SCHANZ SA 4.0MM (MISCELLANEOUS) IMPLANT
SCREW SET 5.5/6.0MM SOLERA (Screw) IMPLANT
SCREW SPINAL IFIX 6.5X45 (Screw) IMPLANT
SPIKE FLUID TRANSFER (MISCELLANEOUS) ×2 IMPLANT
SPONGE SURGIFOAM ABS GEL 100 (HEMOSTASIS) IMPLANT
SPONGE T-LAP 4X18 ~~LOC~~+RFID (SPONGE) IMPLANT
STRIP CLOSURE SKIN 1/2X4 (GAUZE/BANDAGES/DRESSINGS) IMPLANT
SUT VIC AB 0 CT1 18XCR BRD8 (SUTURE) ×2 IMPLANT
SUT VICRYL 3-0 RB1 18 ABS (SUTURE) ×2 IMPLANT
SYR 3ML LL SCALE MARK (SYRINGE) ×6 IMPLANT
TOWEL GREEN STERILE (TOWEL DISPOSABLE) ×2 IMPLANT
TOWEL GREEN STERILE FF (TOWEL DISPOSABLE) ×2 IMPLANT
TRAY FOLEY MTR SLVR 16FR STAT (SET/KITS/TRAYS/PACK) ×2 IMPLANT
TUBE MAZOR SA REDUCTION (TUBING) ×2 IMPLANT
WATER STERILE IRR 1000ML POUR (IV SOLUTION) ×2 IMPLANT

## 2024-02-27 NOTE — H&P (Signed)
 Chief Complaint   Pathologic fracture  History of Present Illness  Samuel [name] is a 58 year old man I am seen for initial consultation although I was involved in stereotactic radio surgery a few years ago for his metastatic lung adenocarcinoma.  Briefly, the patient does have history of the previously mentioned adenocarcinoma initially discovered with pathologic spine fracture for which he also underwent vertebral augmentation.  Patient is under the care of Dr. Gwenyth Bouillon.  Over the last few months he has been complaining of worsening back pain limiting normal daily activities.  He described relatively severe pain across his lower back when changing positions or trying to stand or walk for any length of time.  More recently, he is also complaining of paresthesias including numbness and tingling involving both of his legs.  This is more intermittent, occurring at least a few times a week.  He definitely feels lower extremity symptoms worsen the longer he stands or walks.  Of note, the patient does have a history of metastatic lung adenocarcinoma.  He also has a history of diabetes.  No previous heart attack or stroke.  No known liver or kidney disease although the patient does report a previous history of alcohol abuse and a previous bout of pancreatitis.  He is not on any blood thinners or anti-platelet agents.  Patient is currently smoking cigarettes.  Past Medical History   Past Medical History:  Diagnosis Date   Alcohol use disorder, severe, dependence (HCC) 12/26/2016   Anxiety    Depression    Diabetes mellitus without complication (HCC)    Metastatic cancer to brain (HCC) 08/05/2022   Metastatic cancer to spine (HCC) 07/19/2022   Pancreatitis unk   Primary lung adenocarcinoma (HCC) 08/05/2022   Suicidal ideation    Type 2 diabetes mellitus with hyperglycemia, without long-term current use of insulin (HCC) 06/13/2022   Formatting of this note might be different from the original. 06/13/2022  A1C 13.8, FSBG 414 Start empagliflozin 5mg /metformin 1000 daily, levemir 20 qhs. (Samples given) Will apply for medassist for pharmacy    Past Surgical History   Past Surgical History:  Procedure Laterality Date   IR BONE TUMOR(S)RF ABLATION  08/01/2022   IR IMAGING GUIDED PORT INSERTION  09/21/2022   IR KYPHO LUMBAR INC FX REDUCE BONE BX UNI/BIL CANNULATION INC/IMAGING  08/01/2022    Social History   Social History   Tobacco Use   Smoking status: Every Day    Current packs/day: 0.50    Average packs/day: 0.5 packs/day for 1.3 years (0.6 ttl pk-yrs)    Types: Cigarettes    Start date: 11/16/2022    Last attempt to quit: 08/17/2022   Smokeless tobacco: Never  Vaping Use   Vaping status: Never Used  Substance Use Topics   Alcohol use: Yes    Alcohol/week: 2.0 standard drinks of alcohol    Types: 2 Cans of beer per week   Drug use: No    Medications   Prior to Admission medications   Medication Sig Start Date End Date Taking? Authorizing Provider  acetaminophen (TYLENOL) 500 MG tablet Take 1,000 mg by mouth every 6 (six) hours as needed for moderate pain (pain score 4-6).   Yes [provider]  atorvastatin (LIPITOR) 40 MG tablet Take 1 tablet (40 mg total) by mouth daily. 01/18/24  Yes Hoy Register, MD  buPROPion (WELLBUTRIN XL) 150 MG 24 hr tablet Take 1 tablet (150 mg total) by mouth daily. 01/18/24  Yes Hoy Register, MD  empagliflozin (JARDIANCE) 25  MG TABS tablet Take 1 tablet (25 mg total) by mouth daily before breakfast. 01/18/24  Yes Newlin, Enobong, MD  folic acid (FOLVITE) 1 MG tablet Take 1 tablet (1 mg total) by mouth daily. 04/05/23  Yes Pickenpack-Cousar, Arty Baumgartner, NP  gabapentin (NEURONTIN) 300 MG capsule Take 2 capsules (600 mg total) by mouth 3 (three) times daily. 01/18/24  Yes Newlin, Odette Horns, MD  insulin glargine (LANTUS SOLOSTAR) 100 UNIT/ML Solostar Pen Inject 50 Units into the skin daily. 01/18/24  Yes Hoy Register, MD  Accu-Chek Softclix  Lancets lancets Use to check blood sugar 3 times daily. E11.69 01/27/23   [name], MD  cetirizine (ZYRTEC) 10 MG tablet Take 1 tablet (10 mg total) by mouth daily. 02/06/24   Hoy Register, MD  Continuous Glucose Receiver (FREESTYLE LIBRE 3 READER) DEVI Use to check blood sugar continuously throughout the day. 02/15/24   Hoy Register, MD  Continuous Glucose Sensor (FREESTYLE LIBRE 3 PLUS SENSOR) MISC Change sensor every 15 days. Use to check blood sugar continuously. 02/15/24   Hoy Register, MD  doxycycline (VIBRA-TABS) 100 MG tablet Take 1 tablet (100 mg total) by mouth 2 (two) times daily. 02/06/24   Hoy Register, MD  fluticasone (FLONASE) 50 MCG/ACT nasal spray Place 2 sprays into both nostrils daily. 02/06/24   Hoy Register, MD  glucose blood (ACCU-CHEK GUIDE TEST) test strip Use as instructed twice daily 10/12/23   Hoy Register, MD  glucose blood (ACCU-CHEK GUIDE) test strip Use to check blood sugar 3 times daily. E11.69 01/27/23   [name], MD  Insulin Pen Needle 32G X 4 MM MISC Use with Insulin pen 07/12/23   [name], MD  oxyCODONE (OXYCONTIN) 10 mg 12 hr tablet Take 1 tablet (10 mg total) by mouth every 12 (twelve) hours. 02/16/24   Pickenpack-Cousar, Arty Baumgartner, NP  Oxycodone HCl 10 MG TABS Take 1 tablet (10 mg) by mouth every 6 hours as needed for severe pain. 02/13/24   Pickenpack-Cousar, Arty Baumgartner, NP  tadalafil (CIALIS) 10 MG tablet Take 1 tablet (10 mg total) by mouth every other day as needed for erectile dysfunction. 11/02/23   Hoy Register, MD    Allergies   Allergies  Allergen Reactions   Penicillins Hives   Percocet [Oxycodone-Acetaminophen] Nausea Only and Other (See Comments)    Pt states the pill is hard to swallow. States he was told it may be the coating on the tablet that he is allergic to.     Review of Systems  ROS  Neurologic Exam  Awake, alert, oriented Memory and concentration grossly intact Speech fluent, appropriate CN  grossly intact Motor exam: Upper Extremities Deltoid Bicep Tricep Grip  Right 5/5 5/5 5/5 5/5  Left 5/5 5/5 5/5 5/5   Lower Extremities IP Quad PF DF EHL  Right 5/5 5/5 5/5 5/5 5/5  Left 5/5 5/5 5/5 5/5 5/5   Sensation grossly intact to LT  Imaging  MRI lumbar spine dated 01/04/2024 was personally reviewed.  This demonstrates previous near vertebra plana L2 with previous vertebral augmentation.  There is a portion of retropulsed bone contributing to moderate stenosis at this level.  In addition there is slight reversal of the normal lumbar lordosis focused at this level.  Dynamic thoracolumbar spine x-rays done in the office today I also personally reviewed and demonstrate exaggerated focal kyphosis centered about the L2 fracture most notable comparing upright extended to flexed x-rays.  Impression  58 year old man with combination of mechanical back pain and  symptoms of claudication likely related to pathologic L2 fracture with associated stenosis and focal kyphosis.  Symptoms seem to be limiting normal daily activities.    Plan  - We will plan on proceeding with L2 decompression and instrumented stabilization T12-L4  I have reviewed the indications for the procedure as well as the details of the procedure and the expected postoperative course and recovery at length with the patient in the office. We have also reviewed in detail the risks, benefits, and alternatives to the procedure. All questions were answered and Samuel [name] provided informed consent to proceed.  Lisbeth Renshaw, MD [facility]

## 2024-02-27 NOTE — Op Note (Signed)
 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 Lisbeth Renshaw, MD  ASSISTANT: Dr. Barnett Abu, MD  ANESTHESIA: General Endotracheal  EBL: 150cc  SPECIMENS: None  DRAINS: Closed negative   COMPLICATIONS: None immediate  CONDITION: Hemodynamically stable to PACU  HISTORY: Samuel [name] is a 58 y.o. male with a history of metastatic lung cancer and pathologic L2 fracture which was initially treated with kyphoplasty.  Unfortunately the patient continued to have symptoms of both mechanical back pain and some symptoms of claudication.  His imaging did reveal significant stenosis with early kyphosis at this level.  There was some evidence of instability on dynamic x-rays.  His case was discussed at the multidisciplinary neuro-oncology conference.  Consensus opinion was to offer surgical decompression and instrumented stabilization.  He therefore elected to proceed with L2 laminectomy and T12-L4 stabilization.  Risks, benefits, and alternatives to surgery were all reviewed in detail with the patient.  After all questions were answered, informed consent was obtained and witnessed.  PROCEDURE IN DETAIL: The patient was brought to the operating room. After induction of general anesthesia, the patient was positioned on the operative table in the prone position. All pressure points were meticulously padded.  Midline skin incision was then marked out and prepped and draped in the usual sterile fashion.  After timeout was conducted, the midline skin incision was opened sharply centered above the L2 spinous process after it was infiltrated with local anesthetic with epinephrine.  Incision was then carried down to the  lumbodorsal fascia.  The skin was undermined.  A spinal needle was placed, and the surface projection of the L2 lamina was identified with lateral fluoroscopy.  The fascia was then opened overlying the L2 lamina, and subperiosteal dissection was carried out.  Self-retaining retractor was placed.  A second intraoperative lateral fluoroscopic image was taken to confirm our location.  At this point the L2 laminectomy was completed initially by removal of the spinous process with rongeur's.  The remainder of the laminectomy was completed with a high-speed drill until the ligamentum flavum was identified.  This was carefully elevated with blunt dissection, and Kerrison punches were used to complete the laminectomy including removal of the ligamentum flavum at L2-3 and L1-2 in order to fully decompress the thecal sac centrally.  Once this was done, hemostasis was easily achieved using a combination of bipolar electrocautery and morselized Gelfoam with thrombin.  The muscle was then reapproximated with interrupted 0 Vicryl stitches, and the fascial opening overlying L2 was closed with interrupted 0 Vicryl stitches.  Attention was then turned to the instrumentation.  A small stab incision was made over the left-sided posterior superior iliac spine after infiltrating with local anesthetic with epinephrine.  A Schanz pin was then placed, and the robot was affixed to the Schanz pin.  Lateral and oblique fluoroscopic images were taken for registration with preoperative stereotactic CT scan.  An excellent registration was noted.  A plane was then created for placement of bilateral pedicle screws extending from T12-L4.  Utilizing the stereotactic robotic assistance, pilot holes were drilled and tapped bilaterally at T12, L1, L3, and L4-5 making stab incisions in the fascia.  6.5 x 45 mm screws were then placed at all 4 levels bilaterally.  Screw trajectory was then  checked with AP and lateral fluoroscopy and noted to be in  excellent position.  140 mm straight rod was then sized and passed underneath the fascia.  Setscrews were then placed and final tightened.  Reduction towers were then removed.  The final construct was evaluated with AP and lateral fluoroscopy and was satisfactory.  There was stable alignment of the thoracolumbar spine.  At this point the fascial stab incisions were closed with interrupted 0 Vicryl stitches.  The subcutaneous layer was closed and affixed to the underlying fascia to close any dead space again with 0 Vicryl stitches.  The skin was then closed with staples.  The Schanz pin was removed and this incision was closed with staples.  Bacitracin ointment and a Prevena close negative pressure dressing was placed.  At the end of the case all sponge, needle, instrument, and cottonoid counts were correct.  The patient was then transferred to the stretcher and extubated.  He was taken to the postanesthesia care unit in stable hemodynamic condition.  Lisbeth Renshaw, MD [facility]

## 2024-02-27 NOTE — Transfer of Care (Signed)
 Immediate Anesthesia Transfer of Care Note  Patient: Samuel [name]  Procedure(s) Performed: Lumbar two LAMINECTOMY, Thoracic twelve-Lumbar four INSTRUMENTED STABILIZATION (Back) APPLICATION OF ROBOTIC ASSISTANCE FOR SPINAL PROCEDURE  Patient Location: PACU  Anesthesia Type:General  Level of Consciousness: awake and alert   Airway & Oxygen Therapy: Patient Spontanous Breathing and Patient connected to nasal cannula oxygen  Post-op Assessment: Report given to RN and Post -op Vital signs reviewed and stable, moving all extremities   Post vital signs: Reviewed and stable  Last Vitals:  Vitals Value Taken Time  BP 116/72 02/27/24 1730  Temp 36.6 C 02/27/24 1726  Pulse 78 02/27/24 1732  Resp 13 02/27/24 1732  SpO2 95 % 02/27/24 1732  Vitals shown include unfiled device data.  Last Pain:  Vitals:   02/27/24 1053  TempSrc:   PainSc: 6       Patients Stated Pain Goal: 4 (02/27/24 1053)  Complications: No notable events documented.

## 2024-02-27 NOTE — Anesthesia Procedure Notes (Signed)
 Procedure Name: Intubation Date/Time: 02/27/2024 1:55 PM  Performed by: Einar Grad, CRNAPre-anesthesia Checklist: Patient identified, Emergency Drugs available, Suction available and Patient being monitored Patient Re-evaluated:Patient Re-evaluated prior to induction Oxygen Delivery Method: Circle System Utilized Preoxygenation: Pre-oxygenation with 100% oxygen Induction Type: IV induction Ventilation: Mask ventilation without difficulty Laryngoscope Size: Mac and 4 Grade View: Grade I Tube type: Oral Number of attempts: 1 Airway Equipment and Method: Stylet and Oral airway Placement Confirmation: ETT inserted through vocal cords under direct vision, positive ETCO2 and breath sounds checked- equal and bilateral Secured at: 23 cm Tube secured with: Tape Dental Injury: Teeth and Oropharynx as per pre-operative assessment

## 2024-02-27 NOTE — Plan of Care (Signed)
   Problem: Activity: Goal: Risk for activity intolerance will decrease Outcome: Progressing

## 2024-02-27 NOTE — Progress Notes (Signed)
 Orthopedic Tech Progress Note Patient Details:  Samuel [name] 09/04/1966 [id number]  Ortho Devices Type of Ortho Device: Lumbar corsett Ortho Device/Splint Location: BACK Ortho Device/Splint Interventions: Ordered   Post Interventions Patient Tolerated: Well Instructions Provided: Care of device  [name] 02/27/2024, 6:12 PM

## 2024-02-28 ENCOUNTER — Other Ambulatory Visit (HOSPITAL_COMMUNITY): Payer: Self-pay

## 2024-02-28 ENCOUNTER — Encounter (HOSPITAL_COMMUNITY): Payer: Self-pay | Admitting: Neurosurgery

## 2024-02-28 LAB — GLUCOSE, CAPILLARY: Glucose-Capillary: 194 mg/dL — ABNORMAL HIGH (ref 70–99)

## 2024-02-28 MED ORDER — METHOCARBAMOL 500 MG PO TABS
500.0000 mg | ORAL_TABLET | Freq: Four times a day (QID) | ORAL | 0 refills | Status: AC | PRN
  Filled 2024-02-28: qty 90, 23d supply, fill #0

## 2024-02-28 MED ORDER — OXYCODONE HCL 10 MG PO TABS
10.0000 mg | ORAL_TABLET | Freq: Four times a day (QID) | ORAL | 0 refills | Status: AC | PRN
  Filled 2024-02-28: qty 28, 7d supply, fill #0

## 2024-02-28 MED FILL — Thrombin For Soln 5000 Unit: CUTANEOUS | Qty: 5000 | Status: AC

## 2024-02-28 NOTE — Plan of Care (Signed)
 Pt doing well. Pt and wife given D/C instructions with verbal understanding. Rx's were sent to the pharmacy by MD. Pt's incision is clean and dry with no sign of infection. Pt D/C'd home with Prevena wound vac per MD order. Pt D/C'd home via wheelchair per MD order. Pt is stable @ D/C and has no other needs at this time. Rema Fendt, RN

## 2024-02-28 NOTE — Anesthesia Postprocedure Evaluation (Signed)
 Anesthesia Post Note  Patient: Samuel [name]  Procedure(s) Performed: Lumbar two LAMINECTOMY, Thoracic twelve-Lumbar four INSTRUMENTED STABILIZATION (Back) APPLICATION OF ROBOTIC ASSISTANCE FOR SPINAL PROCEDURE     Patient location during evaluation: PACU Anesthesia Type: General Level of consciousness: awake and alert Pain management: pain level controlled Vital Signs Assessment: post-procedure vital signs reviewed and stable Respiratory status: spontaneous breathing, nonlabored ventilation and respiratory function stable Cardiovascular status: blood pressure returned to baseline and stable Postop Assessment: no apparent nausea or vomiting Anesthetic complications: no   No notable events documented.                  Moe Brier

## 2024-02-28 NOTE — Evaluation (Signed)
 Physical Therapy Evaluation  Patient Details Name: Samuel [name] MRN: [id number] DOB: 02-Dec-1965 Today's Date: 02/28/2024  History of Present Illness  Pt is a 58 y.o. male s/p L2 decompression and posterior instrumented stabilization T12-L4. PMH significant for lung adenocarcinoma with brain/spine METS, DM, pancreatitis, suicidal ideation (2023).  Clinical Impression  Pt admitted with above diagnosis. At the time of PT eval, pt was able to demonstrate transfers and ambulation with gross CGA to supervision for safety and no AD. Pt with difficulty achieving smooth gait pattern due to unsteadiness and wide BOS with decreased knee ROM throughout gait cycle. Able to improve with cues and practice. Pt was educated on precautions, brace application/wearing schedule, appropriate activity progression, and car transfer. Pt currently with functional limitations due to the deficits listed below (see PT Problem List). Pt will benefit from skilled PT to increase their independence and safety with mobility to allow discharge to the venue listed below.          If plan is discharge home, recommend the following: A little help with walking and/or transfers;A little help with bathing/dressing/bathroom;Assistance with cooking/housework;Assist for transportation;Help with stairs or ramp for entrance   Can travel by private vehicle        Equipment Recommendations None recommended by PT  Recommendations for Other Services       Functional Status Assessment Patient has had a recent decline in their functional status and demonstrates the ability to make significant improvements in function in a reasonable and predictable amount of time.     Precautions / Restrictions Precautions Precautions: Back Precaution Booklet Issued: Yes (comment) Recall of Precautions/Restrictions: Intact Precaution/Restrictions Comments: Reviewed handout and pt was cued for precautions during functional mobility. Required Braces  or Orthoses: Spinal Brace Spinal Brace: Lumbar corset;Applied in sitting position Restrictions Weight Bearing Restrictions Per Provider Order: No      Mobility  Bed Mobility Overal bed mobility: Needs Assistance Bed Mobility: Rolling, Sidelying to Sit, Sit to Sidelying Rolling: Supervision Sidelying to sit: Supervision     Sit to sidelying: Supervision General bed mobility comments: VC's for optimal log roll technique. No assist required. HOB elevated to sit up and HOB flat with rails lowered to simulate home environment when returning to supine.    Transfers Overall transfer level: Needs assistance Equipment used: None Transfers: Sit to/from Stand Sit to Stand: Supervision           General transfer comment: VC's for wide BOS and hand placement on seated surface for safety.    Ambulation/Gait Ambulation/Gait assistance: Supervision, Contact guard assist Gait Distance (Feet): 300 Feet Assistive device: None Gait Pattern/deviations: Step-through pattern, Decreased stride length, Trunk flexed, Wide base of support, Knee flexed in stance - right, Knee flexed in stance - left Gait velocity: Decreased Gait velocity interpretation: <1.31 ft/sec, indicative of household ambulator   General Gait Details: Pt with wide BOS with flexed knees difficulty achieving knee extension. When holding onto the railings, pt was able to achieve a more narrow/neutral BOS, with improved posture and improved heel strike. No assist required.  Stairs            Wheelchair Mobility     Tilt Bed    Modified Rankin (Stroke Patients Only)       Balance Overall balance assessment: Needs assistance Sitting-balance support: Feet supported, No upper extremity supported Sitting balance-Leahy Scale: Fair   Postural control: Posterior lean Standing balance support: No upper extremity supported, During functional activity Standing balance-Leahy Scale: Fair  Pertinent Vitals/Pain Pain Assessment Pain Assessment: Faces Faces Pain Scale: Hurts a little bit Pain Location: back Pain Descriptors / Indicators: Discomfort Pain Intervention(s): Limited activity within patient's tolerance, Monitored during session, Repositioned    Home Living Family/patient expects to be discharged to:: Private residence Living Arrangements: Other (Comment) (roommate, GF also stays) Available Help at Discharge: Other (Comment) (roommate, girlfiriend) Type of Home: House Home Access: Stairs to enter Entrance Stairs-Rails: None Entrance Stairs-Number of Steps: 3   Home Layout: One level Home Equipment: Other (comment) (suction cup grab bars in shower) Additional Comments: has TLSO prom PTA, but new brace is LSO    Prior Function Prior Level of Function : Independent/Modified Independent             Mobility Comments: no AD ADLs Comments: Independent in ADL and IADL, does not work or drive     Extremity/Trunk Assessment   Upper Extremity Assessment Upper Extremity Assessment: Defer to OT evaluation    Lower Extremity Assessment Lower Extremity Assessment: Generalized weakness    Cervical / Trunk Assessment Cervical / Trunk Assessment: Back Surgery  Communication   Communication Communication: No apparent difficulties    Cognition Arousal: Alert Behavior During Therapy: WFL for tasks assessed/performed   PT - Cognitive impairments: No apparent impairments                         Following commands: Intact       Cueing Cueing Techniques: Verbal cues (to optimize adherence to spinal precautions)     General Comments General comments (skin integrity, edema, etc.): girlfriend present and supportive    Exercises     Assessment/Plan    PT Assessment Patient needs continued PT services  PT Problem List Decreased strength;Decreased activity tolerance;Decreased balance;Decreased mobility;Decreased knowledge of use of  DME;Decreased safety awareness;Decreased knowledge of precautions;Pain       PT Treatment Interventions DME instruction;Gait training;Stair training;Functional mobility training;Therapeutic activities;Therapeutic exercise;Balance training;Patient/family education    PT Goals (Current goals can be found in the Care Plan section)  Acute Rehab PT Goals Patient Stated Goal: Be able to transition back to the gym PT Goal Formulation: With patient Time For Goal Achievement: 03/06/24 Potential to Achieve Goals: Good    Frequency Min 5X/week     Co-evaluation               AM-PAC PT "6 Clicks" Mobility  Outcome Measure Help needed turning from your back to your side while in a flat bed without using bedrails?: A Little Help needed moving from lying on your back to sitting on the side of a flat bed without using bedrails?: A Little Help needed moving to and from a bed to a chair (including a wheelchair)?: A Little Help needed standing up from a chair using your arms (e.g., wheelchair or bedside chair)?: A Little Help needed to walk in hospital room?: A Little Help needed climbing 3-5 steps with a railing? : A Little 6 Click Score: 18    End of Session Equipment Utilized During Treatment: Gait belt;Back brace Activity Tolerance: Patient tolerated treatment well Patient left: in bed;with call bell/phone within reach;with family/visitor present Nurse Communication: Mobility status PT Visit Diagnosis: Unsteadiness on feet (R26.81);Pain Pain - part of body:  (back)    Time: 4098-1191 PT Time Calculation (min) (ACUTE ONLY): 12 min   Charges:   PT Evaluation $PT Eval Low Complexity: 1 Low   PT General Charges $$ ACUTE PT VISIT: 1 Visit  Conni Slipper, PT, DPT Acute Rehabilitation Services Secure Chat Preferred Office: [phone]   Marylynn Pearson 02/28/2024, 10:17 AM

## 2024-02-28 NOTE — Evaluation (Signed)
 Occupational Therapy Evaluation Patient Details Name: Samuel [name] MRN: [id number] DOB: 23-Dec-1965 Today's Date: 02/28/2024   History of Present Illness   Pt is a 58 y.o. male s/p L2 decompression and posterior instrumented stabilization T12-L4. PMH significant for lung adenocarcinoma with brain/spine METS, DM, pancreatitis, suicidal ideation (2023).     Clinical Impressions PTA, pt lived with a roommate and was mod I for ADL and IADL. Upon eval, pt performing UB ADL with mod I and LB ADL with up to supervision. Pt educated and demonstrating use of compensatory techniques for bed mobility, LB ADL, grooming, toileting, and shower transfers within precautions. All education provided and questions answered. Recommending discharge home with no OT follow up at this time. OT to sign off. Please re-consult if change in status.         If plan is discharge home, recommend the following:   A little help with walking and/or transfers;A little help with bathing/dressing/bathroom;Assistance with cooking/housework;Help with stairs or ramp for entrance;Assist for transportation     Functional Status Assessment   Patient has had a recent decline in their functional status and demonstrates the ability to make significant improvements in function in a reasonable and predictable amount of time.     Equipment Recommendations   None recommended by OT     Recommendations for Other Services         Precautions/Restrictions   Precautions Precautions: Back Precaution Booklet Issued: Yes (comment) Recall of Precautions/Restrictions: Intact Precaution/Restrictions Comments: reviewed all precautions within the context of ADL Required Braces or Orthoses: Spinal Brace Spinal Brace: Lumbar corset;Applied in sitting position Restrictions Weight Bearing Restrictions Per Provider Order: No     Mobility Bed Mobility Overal bed mobility: Needs Assistance Bed Mobility: Rolling, Sidelying to  Sit Rolling: Supervision Sidelying to sit: Supervision       General bed mobility comments: 1 cue to optimize technique    Transfers Overall transfer level: Needs assistance Equipment used: None Transfers: Sit to/from Stand Sit to Stand: Supervision           General transfer comment: approaching mod I for basic STS, slightly unsteady during first 2 attempts      Balance Overall balance assessment: Mild deficits observed, not formally tested (no LOB but unsteady gait)                                         ADL either performed or assessed with clinical judgement   ADL Overall ADL's : Needs assistance/impaired Eating/Feeding: Independent   Grooming: Modified independent;Standing   Upper Body Bathing: Modified independent;Sitting   Lower Body Bathing: Supervison/ safety;Sit to/from stand   Upper Body Dressing : Modified independent;Sitting Upper Body Dressing Details (indicate cue type and reason): and brace application Lower Body Dressing: Supervision/safety;Sit to/from stand   Toilet Transfer: Supervision/safety   Toileting- Architect and Hygiene: Supervision/safety   Tub/ Engineer, structural: Supervision/safety;Tub transfer;Ambulation   Functional mobility during ADLs: Supervision/safety       Vision Patient Visual Report: No change from baseline Vision Assessment?: No apparent visual deficits     Perception Perception: Within Functional Limits       Praxis Praxis: WFL       Pertinent Vitals/Pain Pain Assessment Pain Assessment: Faces Faces Pain Scale: Hurts a little bit Pain Location: back Pain Descriptors / Indicators: Discomfort Pain Intervention(s): Limited activity within patient's tolerance, Monitored during session  Extremity/Trunk Assessment Upper Extremity Assessment Upper Extremity Assessment: Overall WFL for tasks assessed   Lower Extremity Assessment Lower Extremity Assessment: Defer to PT  evaluation   Cervical / Trunk Assessment Cervical / Trunk Assessment: Back Surgery   Communication Communication Communication: No apparent difficulties   Cognition Arousal: Alert Behavior During Therapy: WFL for tasks assessed/performed Cognition: No apparent impairments                               Following commands: Intact       Cueing  General Comments   Cueing Techniques: Verbal cues (to optimize adherence to spinal precautions)  girlfriend present and supportive   Exercises     Shoulder Instructions      Home Living Family/patient expects to be discharged to:: Private residence Living Arrangements: Other (Comment) (roommate, GF also stays) Available Help at Discharge: Other (Comment) (roommate, girlfiriend) Type of Home: House Home Access: Stairs to enter [employer] of Steps: 3 Entrance Stairs-Rails: None Home Layout: One level     Bathroom Shower/Tub: Chief Strategy Officer: Standard     Home Equipment: Other (comment) (suction cup grab bars in shower)   Additional Comments: has TLSO prom PTA, but new brace is LSO      Prior Functioning/Environment Prior Level of Function : Independent/Modified Independent             Mobility Comments: no AD ADLs Comments: Independent in ADL and IADL, does not work or drive    OT Problem List: Decreased strength;Decreased activity tolerance;Impaired balance (sitting and/or standing);Decreased knowledge of precautions;Pain   OT Treatment/Interventions:        OT Goals(Current goals can be found in the care plan section)   Acute Rehab OT Goals Patient Stated Goal: go home OT Goal Formulation: With patient   OT Frequency:       Co-evaluation              AM-PAC OT "6 Clicks" Daily Activity     Outcome Measure Help from another person eating meals?: None Help from another person taking care of personal grooming?: None Help from another person toileting,  which includes using toliet, bedpan, or urinal?: A Little Help from another person bathing (including washing, rinsing, drying)?: A Little Help from another person to put on and taking off regular upper body clothing?: None Help from another person to put on and taking off regular lower body clothing?: A Little 6 Click Score: 21   End of Session Equipment Utilized During Treatment: Back brace Nurse Communication: Mobility status  Activity Tolerance: Patient tolerated treatment well Patient left: in bed;with call bell/phone within reach;with family/visitor present  OT Visit Diagnosis: Unsteadiness on feet (R26.81);Muscle weakness (generalized) (M62.81);Pain Pain - part of body:  (back)                Time: 6578-4696 OT Time Calculation (min): 26 min Charges:  OT General Charges $OT Visit: 1 Visit OT Evaluation $OT Eval Low Complexity: 1 Low OT Treatments $Self Care/Home Management : 8-22 mins  Tyler Deis, OTR/L [facility] Acute Rehabilitation Office: [phone]   Myrla Halsted 02/28/2024, 9:37 AM

## 2024-02-28 NOTE — Discharge Summary (Signed)
 Physician Discharge Summary  Patient ID: BREK Samuel [name] MRN: [id number] DOB/AGE: 01-04-1966 58 y.o.  Admit date: 02/27/2024 Discharge date: 02/28/2024  Admission Diagnoses:  Pathologic fracture L2  Discharge Diagnoses:  Same Principal Problem:   Pathological fracture of lumbosacral spine   Discharged Condition: Stable  Hospital Course:  Samuel [name] is a 58 y.o. male Admitted after elective decompression/stabilization. He was at baseline postop reporting improvement in balance. He was tolerating diet, voiding normally, with pain under control with oral meds and requested d/c.  Treatments: Surgery - L2 decompression/stabilization T12-L4  Discharge Exam: Blood pressure 122/78, pulse 88, temperature 98.7 F (37.1 C), temperature source Oral, resp. rate 18, height 5\' 8"  (1.727 m), weight 68.9 kg, SpO2 100%. Awake, alert, oriented Speech fluent, appropriate CN grossly intact 5/5 BUE/BLE Wound c/d/i  Disposition: Discharge disposition: 01-Home or Self Care       Discharge Instructions     Call MD for:  redness, tenderness, or signs of infection (pain, swelling, redness, odor or green/yellow discharge around incision site)   Complete by: As directed    Call MD for:  temperature >100.4   Complete by: As directed    Diet - low sodium heart healthy   Complete by: As directed    Discharge instructions   Complete by: As directed    Walk at home as much as possible, at least 4 times / day   Discharge wound care:   Complete by: As directed    Remove wound vac if it stops maintaining suction or in 6 days (whichever comes first)   Incentive spirometry RT   Complete by: As directed    Increase activity slowly   Complete by: As directed    Lifting restrictions   Complete by: As directed    No lifting > 10 lbs   May shower / Bathe   Complete by: As directed    48 hours after surgery   May walk up steps   Complete by: As directed    Other Restrictions   Complete  by: As directed    No bending/twisting at waist      Allergies as of 02/28/2024       Reactions   Penicillins Hives   Percocet [oxycodone-acetaminophen] Nausea Only, Other (See Comments)   Pt states the pill is hard to swallow. States he was told it may be the coating on the tablet that he is allergic to.         Medication List     TAKE these medications    Accu-Chek Guide test strip Generic drug: glucose blood Use to check blood sugar 3 times daily. E11.69   Accu-Chek Guide Test test strip Generic drug: glucose blood Use as instructed twice daily   Accu-Chek Softclix Lancets lancets Use to check blood sugar 3 times daily. E11.69   acetaminophen 500 MG tablet Commonly known as: TYLENOL Take 1,000 mg by mouth every 6 (six) hours as needed for moderate pain (pain score 4-6).   atorvastatin 40 MG tablet Commonly known as: LIPITOR Take 1 tablet (40 mg total) by mouth daily.   buPROPion 150 MG 24 hr tablet Commonly known as: Wellbutrin XL Take 1 tablet (150 mg total) by mouth daily.   cetirizine 10 MG tablet Commonly known as: ZYRTEC Take 1 tablet (10 mg total) by mouth daily.   doxycycline 100 MG tablet Commonly known as: VIBRA-TABS Take 1 tablet (100 mg total) by mouth 2 (two) times daily.   fluticasone  50 MCG/ACT nasal spray Commonly known as: FLONASE Place 2 sprays into both nostrils daily.   folic acid 1 MG tablet Commonly known as: FOLVITE Take 1 tablet (1 mg total) by mouth daily.   FreeStyle Libre 3 Plus Sensor Misc Change sensor every 15 days. Use to check blood sugar continuously.   FreeStyle Libre 3 Reader [place] Use to check blood sugar continuously throughout the day.   gabapentin 300 MG capsule Commonly known as: NEURONTIN Take 2 capsules (600 mg total) by mouth 3 (three) times daily.   Insulin Pen Needle 32G X 4 MM Misc Use with Insulin pen   Jardiance 25 MG Tabs tablet Generic drug: empagliflozin Take 1 tablet (25 mg total) by mouth  daily before breakfast.   Lantus SoloStar 100 UNIT/ML Solostar Pen Generic drug: insulin glargine Inject 50 Units into the skin daily.   methocarbamol 500 MG tablet Commonly known as: ROBAXIN Take 1 tablet (500 mg total) by mouth every 6 (six) hours as needed for muscle spasms.   OxyCONTIN 10 MG 12 hr tablet Generic drug: oxyCODONE Take 1 tablet (10 mg total) by mouth every 12 (twelve) hours.   Oxycodone HCl 10 MG Tabs Take 1 tablet (10 mg) by mouth every 6 hours as needed for severe pain.   tadalafil 10 MG tablet Commonly known as: CIALIS Take 1 tablet (10 mg total) by mouth every other day as needed for erectile dysfunction.               Discharge Care Instructions  (From admission, onward)           Start     Ordered   02/28/24 0000  Discharge wound care:       Comments: Remove wound vac if it stops maintaining suction or in 6 days (whichever comes first)   02/28/24 0850            Follow-up Information     Lisbeth Renshaw, MD Follow up in 2 week(s).   Specialty: Neurosurgery Why: For staple removal Contact information: [address] [place] [address] [phone]                 Signed: Jackelyn Hoehn 02/28/2024, 8:51 AM

## 2024-02-29 ENCOUNTER — Telehealth: Payer: Self-pay

## 2024-02-29 NOTE — Transitions of Care (Post Inpatient/ED Visit) (Signed)
   02/29/2024  Name: Samuel [name] MRN: [id number] DOB: 1965-12-30  Today's TOC FU Call Status: Today's TOC FU Call Status:: Unsuccessful Call (1st Attempt) Unsuccessful Call (1st Attempt) Date: 02/29/24  Attempted to reach the patient regarding the most recent Inpatient/ED visit.  Follow Up Plan: Additional outreach attempts will be made to reach the patient to complete the Transitions of Care (Post Inpatient/ED visit) call.   Signature  Robyne Peers, RN

## 2024-03-04 ENCOUNTER — Inpatient Hospital Stay: Attending: Adult Health

## 2024-03-04 ENCOUNTER — Telehealth: Payer: Self-pay

## 2024-03-04 DIAGNOSIS — C3411 Malignant neoplasm of upper lobe, right bronchus or lung: Secondary | ICD-10-CM | POA: Insufficient documentation

## 2024-03-04 DIAGNOSIS — F1020 Alcohol dependence, uncomplicated: Secondary | ICD-10-CM | POA: Insufficient documentation

## 2024-03-04 DIAGNOSIS — Z79899 Other long term (current) drug therapy: Secondary | ICD-10-CM | POA: Insufficient documentation

## 2024-03-04 DIAGNOSIS — E119 Type 2 diabetes mellitus without complications: Secondary | ICD-10-CM | POA: Insufficient documentation

## 2024-03-04 DIAGNOSIS — Z5112 Encounter for antineoplastic immunotherapy: Secondary | ICD-10-CM | POA: Insufficient documentation

## 2024-03-04 DIAGNOSIS — Z7984 Long term (current) use of oral hypoglycemic drugs: Secondary | ICD-10-CM | POA: Insufficient documentation

## 2024-03-04 DIAGNOSIS — C7931 Secondary malignant neoplasm of brain: Secondary | ICD-10-CM | POA: Insufficient documentation

## 2024-03-04 DIAGNOSIS — R45851 Suicidal ideations: Secondary | ICD-10-CM | POA: Insufficient documentation

## 2024-03-04 NOTE — Transitions of Care (Post Inpatient/ED Visit) (Signed)
   03/04/2024  Name: MICHALL NOFFKE II MRN: [id number] DOB: 09/11/1966  Today's TOC FU Call Status: Today's TOC FU Call Status:: Successful TOC FU Call Completed Unsuccessful Call (1st Attempt) Date: 02/29/24 [facility] FU Call Complete Date: 03/04/24 Patient's Name and Date of Birth confirmed.  Transition Care Management Follow-up Telephone Call Date of Discharge: 02/28/24 Discharge Facility: [facility]) Type of Discharge: Inpatient Admission Primary Inpatient Discharge Diagnosis:: pathological fracture lumbosacral spine How have you been since you were released from the hospital?: Better (He said today he is feeling pretty good, getting better) Any questions or concerns?: No  Items Reviewed: Did you receive and understand the discharge instructions provided?: Yes Medications obtained,verified, and reconciled?: Partial Review Completed Reason for Partial Mediation Review: He said he has all of his medications and did not have any questions about the med regime. He said he has a [employer] but has not put it on yet. He stated he was instructed not to put it on prior to his surgery.  He also has a standard glucometer. Any new allergies since your discharge?: No Dietary orders reviewed?: Yes Type of Diet Ordered:: heart healthy low sodium, diabetic Do you have support at home?: Yes People in Home [RPT]: significant other Name of Support/Comfort Primary Source: his girfriend  Medications Reviewed Today: Medications Reviewed Today   Medications were not reviewed in this encounter     Home Care and Equipment/Supplies: Were Home Health Services Ordered?: No Any new equipment or medical supplies ordered?: Yes Name of Medical supply agency?: he received a new spinal brace, LSO, which he states he has been wearing.  He also stated that his wound VAC remains in place and functioning and he has another day to have it in place. Were you able to get the equipment/medical supplies?: Yes Do  you have any questions related to the use of the equipment/supplies?: No  Functional Questionnaire: Do you need assistance with bathing/showering or dressing?: Yes (his girlfriend assists as needed) Do you need assistance with meal preparation?: Yes (His girlfriend assists as needed) Do you need assistance with eating?: No Do you have difficulty maintaining continence: No Do you need assistance with getting out of bed/getting out of a chair/moving?: Yes (ambulates without an assistive device but has wears his LSO.  He stated he his becoming more independent.) Do you have difficulty managing or taking your medications?: No  Follow up appointments reviewed: PCP Follow-up appointment confirmed?: Yes Date of PCP follow-up appointment?: 04/17/24 (He said he will call the clinic if he feels he needs to be seen sooner) Follow-up Provider: [facility] Follow-up appointment confirmed?: No Reason Specialist Follow-Up Not Confirmed: Patient has Specialist Provider Number and will Call for Appointment (He needs to call to schedule his follow up with the neurosurgeon.  He has an appointment with oncology 03/11/2024 and [facility], [facility] 03/19/2024.) Do you need transportation to your follow-up appointment?: No Do you understand care options if your condition(s) worsen?: Yes-patient verbalized understanding    SIGNATURE Burnett Carson, RN

## 2024-03-11 ENCOUNTER — Other Ambulatory Visit (HOSPITAL_COMMUNITY): Payer: Self-pay

## 2024-03-11 ENCOUNTER — Inpatient Hospital Stay (HOSPITAL_BASED_OUTPATIENT_CLINIC_OR_DEPARTMENT_OTHER): Payer: Medicaid Other | Admitting: Internal Medicine

## 2024-03-11 ENCOUNTER — Inpatient Hospital Stay: Payer: Medicaid Other

## 2024-03-11 ENCOUNTER — Encounter: Payer: Self-pay | Admitting: Nurse Practitioner

## 2024-03-11 ENCOUNTER — Inpatient Hospital Stay (HOSPITAL_BASED_OUTPATIENT_CLINIC_OR_DEPARTMENT_OTHER): Admitting: Nurse Practitioner

## 2024-03-11 VITALS — BP 99/64 | HR 88 | Temp 97.8°F | Resp 17 | Ht 68.0 in | Wt 150.0 lb

## 2024-03-11 DIAGNOSIS — C3490 Malignant neoplasm of unspecified part of unspecified bronchus or lung: Secondary | ICD-10-CM

## 2024-03-11 DIAGNOSIS — K5903 Drug induced constipation: Secondary | ICD-10-CM

## 2024-03-11 DIAGNOSIS — R45851 Suicidal ideations: Secondary | ICD-10-CM | POA: Diagnosis not present

## 2024-03-11 DIAGNOSIS — Z515 Encounter for palliative care: Secondary | ICD-10-CM

## 2024-03-11 DIAGNOSIS — Z5112 Encounter for antineoplastic immunotherapy: Secondary | ICD-10-CM | POA: Diagnosis present

## 2024-03-11 DIAGNOSIS — C7931 Secondary malignant neoplasm of brain: Secondary | ICD-10-CM | POA: Diagnosis not present

## 2024-03-11 DIAGNOSIS — Z7984 Long term (current) use of oral hypoglycemic drugs: Secondary | ICD-10-CM | POA: Diagnosis not present

## 2024-03-11 DIAGNOSIS — Z79899 Other long term (current) drug therapy: Secondary | ICD-10-CM | POA: Diagnosis not present

## 2024-03-11 DIAGNOSIS — F1020 Alcohol dependence, uncomplicated: Secondary | ICD-10-CM | POA: Diagnosis not present

## 2024-03-11 DIAGNOSIS — E119 Type 2 diabetes mellitus without complications: Secondary | ICD-10-CM | POA: Diagnosis not present

## 2024-03-11 DIAGNOSIS — G893 Neoplasm related pain (acute) (chronic): Secondary | ICD-10-CM | POA: Diagnosis not present

## 2024-03-11 DIAGNOSIS — C3411 Malignant neoplasm of upper lobe, right bronchus or lung: Secondary | ICD-10-CM | POA: Diagnosis not present

## 2024-03-11 LAB — CBC WITH DIFFERENTIAL (CANCER CENTER ONLY)
Abs Immature Granulocytes: 0.04 10*3/uL (ref 0.00–0.07)
Basophils Absolute: 0 10*3/uL (ref 0.0–0.1)
Basophils Relative: 0 %
Eosinophils Absolute: 0 10*3/uL (ref 0.0–0.5)
Eosinophils Relative: 0 %
HCT: 35.9 % — ABNORMAL LOW (ref 39.0–52.0)
Hemoglobin: 12.1 g/dL — ABNORMAL LOW (ref 13.0–17.0)
Immature Granulocytes: 0 %
Lymphocytes Relative: 10 %
Lymphs Abs: 1.1 10*3/uL (ref 0.7–4.0)
MCH: 31.9 pg (ref 26.0–34.0)
MCHC: 33.7 g/dL (ref 30.0–36.0)
MCV: 94.7 fL (ref 80.0–100.0)
Monocytes Absolute: 0.9 10*3/uL (ref 0.1–1.0)
Monocytes Relative: 8 %
Neutro Abs: 9.5 10*3/uL — ABNORMAL HIGH (ref 1.7–7.7)
Neutrophils Relative %: 82 %
Platelet Count: 405 10*3/uL — ABNORMAL HIGH (ref 150–400)
RBC: 3.79 MIL/uL — ABNORMAL LOW (ref 4.22–5.81)
RDW: 13.1 % (ref 11.5–15.5)
WBC Count: 11.5 10*3/uL — ABNORMAL HIGH (ref 4.0–10.5)
nRBC: 0 % (ref 0.0–0.2)

## 2024-03-11 LAB — TSH: TSH: 0.49 u[IU]/mL (ref 0.350–4.500)

## 2024-03-11 LAB — CMP (CANCER CENTER ONLY)
ALT: 25 U/L (ref 0–44)
AST: 15 U/L (ref 15–41)
Albumin: 3.8 g/dL (ref 3.5–5.0)
Alkaline Phosphatase: 85 U/L (ref 38–126)
Anion gap: 6 (ref 5–15)
BUN: 19 mg/dL (ref 6–20)
CO2: 25 mmol/L (ref 22–32)
Calcium: 9.4 mg/dL (ref 8.9–10.3)
Chloride: 103 mmol/L (ref 98–111)
Creatinine: 0.66 mg/dL (ref 0.61–1.24)
GFR, Estimated: >60 mL/min (ref >60)
Glucose, Bld: 266 mg/dL — ABNORMAL HIGH (ref 70–99)
Potassium: 4 mmol/L (ref 3.5–5.1)
Sodium: 134 mmol/L — ABNORMAL LOW (ref 135–145)
Total Bilirubin: 0.3 mg/dL (ref 0.0–1.2)
Total Protein: 7.8 g/dL (ref 6.5–8.1)

## 2024-03-11 MED ORDER — HEPARIN SOD (PORK) LOCK FLUSH 100 UNIT/ML IV SOLN
500.0000 [IU] | Freq: Once | INTRAVENOUS | Status: AC | PRN
  Administered 2024-03-11: 500 [IU]

## 2024-03-11 MED ORDER — SODIUM CHLORIDE 0.9 % IV SOLN: Freq: Once | INTRAVENOUS | Status: AC

## 2024-03-11 MED ORDER — PROCHLORPERAZINE MALEATE 10 MG PO TABS
10.0000 mg | ORAL_TABLET | Freq: Once | ORAL | Status: AC
  Administered 2024-03-11: 10 mg via ORAL
  Filled 2024-03-11: qty 1

## 2024-03-11 MED ORDER — OXYCODONE HCL ER 10 MG PO T12A
10.0000 mg | EXTENDED_RELEASE_TABLET | Freq: Two times a day (BID) | ORAL | 0 refills | Status: DC
  Filled 2024-03-15 – 2024-03-18 (×2): qty 60, 30d supply, fill #0

## 2024-03-11 MED ORDER — SODIUM CHLORIDE 0.9 % IV SOLN
500.0000 mg/m2 | Freq: Once | INTRAVENOUS | Status: AC
  Administered 2024-03-11: 900 mg via INTRAVENOUS
  Filled 2024-03-11: qty 20

## 2024-03-11 MED ORDER — SODIUM CHLORIDE 0.9% FLUSH
10.0000 mL | INTRAVENOUS | Status: DC | PRN
  Administered 2024-03-11: 10 mL

## 2024-03-11 MED ORDER — CYANOCOBALAMIN 1000 MCG/ML IJ SOLN
1000.0000 ug | Freq: Once | INTRAMUSCULAR | Status: AC
  Administered 2024-03-11: 1000 ug via INTRAMUSCULAR
  Filled 2024-03-11: qty 1

## 2024-03-11 MED ORDER — SODIUM CHLORIDE 0.9 % IV SOLN
200.0000 mg | Freq: Once | INTRAVENOUS | Status: AC
  Administered 2024-03-11: 200 mg via INTRAVENOUS
  Filled 2024-03-11: qty 200

## 2024-03-11 MED ORDER — OXYCODONE HCL 10 MG PO TABS
10.0000 mg | ORAL_TABLET | Freq: Four times a day (QID) | ORAL | 0 refills | Status: DC | PRN
  Filled 2024-03-11: qty 60, 15d supply, fill #0

## 2024-03-11 NOTE — Progress Notes (Signed)
 [facility] Telephone:(336) [phone]   Fax:(336) [phone]  OFFICE PROGRESS NOTE  [name], MD 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  Biomarker Findings Microsatellite status - MS-Stable Tumor Mutational Burden - 8 Muts/Mb Genomic Findings For a complete list of the genes assayed, please refer to the Appendix. KEAP1 Q227* CDKN2A/B [id number] G89V PRKCI amplification TERC amplification - equivocal? TP53 [id number]*18 8 Disease relevant genes with no reportable alterations: ALK, BRAF, EGFR, ERBB2, KRAS, MET, RET, ROS1  PDL1 TPS  90%  PRIOR THERAPY:  1) Status post palliative radiotherapy to the L2 lesion under the care of Dr. Lorri Rota. 2) Status post SRS to solitary brain metastasis under the care of Dr. Lorri Rota  CURRENT THERAPY: Palliative systemic therapy with carboplatin  for AUC of 5, Alimta  500 Mg/M2 and Keytruda  200 Mg IV every 3 weeks.  First dose September 05, 2022.  Status post 26 cycles.  Starting from cycle #5 the patient will be on maintenance treatment with Alimta  and Keytruda  every 3 weeks.  INTERVAL HISTORY: Samuel [name] 58 y.o. male returns to the clinic today for follow-up visit accompanied by his girlfriend.Discussed the use of AI scribe software for clinical note transcription with the patient, who gave verbal consent to proceed.  History of Present Illness   Samuel [name] is a 58 year old male who presents for staple removal following back surgery.  He underwent back surgery on February 27, 2024, which involved a laminectomy to stabilize his spine.  The incision has a 'zipper' appearance due to the staples. He spent one day in the hospital post-surgery and has been recovering at home since then.  He had Laminectomy (instrumental decompression) performed by Dr. Nat Badger two weeks ago He reports significant improvement in symptoms, specifically noting the absence of numbness in his legs since the surgery.       MEDICAL HISTORY: Past Medical History:  Diagnosis Date   Alcohol  use disorder, severe, dependence (HCC) 12/26/2016   Anxiety    Depression    Diabetes mellitus without complication (HCC)    Metastatic cancer to brain (HCC) 08/05/2022   Metastatic cancer to spine (HCC) 07/19/2022   Pancreatitis unk   Primary lung adenocarcinoma (HCC) 08/05/2022   Suicidal ideation    Type 2 diabetes mellitus with hyperglycemia, without long-term current use of insulin  (HCC) 06/13/2022   Formatting of this note might be different from the original. 06/13/2022 A1C 13.8, FSBG 414 Start empagliflozin  5mg /metformin  1000 daily, levemir 20 qhs. (Samples given) Will apply for medassist for pharmacy    ALLERGIES:  is allergic to penicillins and percocet [oxycodone -acetaminophen ].  MEDICATIONS:  Current Outpatient Medications  Medication Sig Dispense Refill   Accu-Chek Softclix Lancets lancets Use to check blood sugar 3 times daily. E11.69 100 each 6   acetaminophen  (TYLENOL ) 500 MG tablet Take 1,000 mg by mouth every 6 (six) hours as needed for moderate pain (pain score 4-6).     atorvastatin  (LIPITOR) 40 MG tablet Take 1 tablet (40 mg total) by mouth daily. 90 tablet 1   buPROPion  (WELLBUTRIN  XL) 150 MG 24 hr tablet Take 1 tablet (150 mg total) by  mouth daily. 90 tablet 1   cetirizine  (ZYRTEC ) 10 MG tablet Take 1 tablet (10 mg total) by mouth daily. 30 tablet 1   Continuous Glucose Receiver (FREESTYLE LIBRE 3 READER) DEVI Use to check blood sugar continuously throughout the day. 1 each 0   Continuous Glucose Sensor (FREESTYLE LIBRE 3 PLUS SENSOR)  MISC Change sensor every 15 days. Use to check blood sugar continuously. 2 each 6   doxycycline  (VIBRA -TABS) 100 MG tablet Take 1 tablet (100 mg total) by mouth 2 (two) times daily. 20 tablet 0   empagliflozin  (JARDIANCE ) 25 MG TABS tablet Take 1 tablet (25 mg total) by mouth daily before breakfast. 90 tablet 1   fluticasone  (FLONASE ) 50 MCG/ACT nasal spray Place 2 sprays into both nostrils daily. 16 g 1   folic acid  (FOLVITE ) 1 MG tablet Take 1 tablet (1 mg total) by mouth daily. 30 tablet 4   gabapentin  (NEURONTIN ) 300 MG capsule Take 2 capsules (600 mg total) by mouth 3 (three) times daily. 180 capsule 1   glucose blood (ACCU-CHEK GUIDE TEST) test strip Use as instructed twice daily 100 each 12   glucose blood (ACCU-CHEK GUIDE) test strip Use to check blood sugar 3 times daily. E11.69 100 each 6   insulin  glargine (LANTUS  SOLOSTAR) 100 UNIT/ML Solostar Pen Inject 50 Units into the skin daily. 30 mL 11   Insulin  Pen Needle 32G X 4 MM MISC Use with Insulin  pen 100 each 3   methocarbamol  (ROBAXIN ) 500 MG tablet Take 1 tablet (500 mg total) by mouth every 6 (six) hours as needed for muscle spasms. 90 tablet 0   oxyCODONE  (OXYCONTIN ) 10 mg 12 hr tablet Take 1 tablet (10 mg total) by mouth every 12 (twelve) hours. 60 tablet 0   tadalafil  (CIALIS ) 10 MG tablet Take 1 tablet (10 mg total) by mouth every other day as needed for erectile dysfunction. 10 tablet 2   No current facility-administered medications for this visit.    SURGICAL HISTORY:  Past Surgical History:  Procedure Laterality Date   APPLICATION OF ROBOTIC ASSISTANCE FOR SPINAL PROCEDURE N/A 02/27/2024   Procedure: APPLICATION OF ROBOTIC ASSISTANCE FOR SPINAL PROCEDURE;  Surgeon: Augusto Blonder, MD;  Location: [facility] OR;  Service: Neurosurgery;  Laterality: N/A;   IR BONE TUMOR(S)RF ABLATION  08/01/2022   IR IMAGING GUIDED PORT INSERTION  09/21/2022   IR KYPHO LUMBAR INC FX REDUCE BONE BX UNI/BIL CANNULATION INC/IMAGING  08/01/2022    LAMINECTOMY WITH POSTERIOR LATERAL ARTHRODESIS LEVEL 4 N/A 02/27/2024   Procedure: Lumbar two LAMINECTOMY, Thoracic twelve-Lumbar four INSTRUMENTED STABILIZATION;  Surgeon: Augusto Blonder, MD;  Location: [facility] OR;  Service: Neurosurgery;  Laterality: N/A;    REVIEW OF SYSTEMS:  A comprehensive review of systems was negative.   PHYSICAL EXAMINATION: General appearance: alert, cooperative, and no distress Head: Normocephalic, without obvious abnormality, atraumatic Neck: no adenopathy, no JVD, supple, symmetrical, trachea midline, and thyroid  not enlarged, symmetric, no tenderness/mass/nodules Lymph nodes: Cervical, supraclavicular, and axillary nodes normal. Resp: clear to auscultation bilaterally Back: symmetric, no curvature. ROM normal. No CVA tenderness. Cardio: regular rate and rhythm, S1, S2 normal, no murmur, click, rub or gallop GI: soft, non-tender; bowel sounds normal; no masses,  no organomegaly Extremities: extremities normal, atraumatic, no cyanosis or edema  ECOG PERFORMANCE STATUS: 1 - Symptomatic but completely ambulatory  Blood pressure 99/64, pulse 88, temperature 97.8 F (36.6 C), temperature source Temporal, resp. rate 17, height 5\' 8"  (1.727 m), weight 150 lb (68 kg), SpO2 100%.  LABORATORY DATA: Lab  Results  Component Value Date   WBC 11.5 (H) 03/11/2024   HGB 12.1 (L) 03/11/2024   HCT 35.9 (L) 03/11/2024   MCV 94.7 03/11/2024   PLT 405 (H) 03/11/2024      Chemistry      Component Value Date/Time   NA 135 02/19/2024 0825   NA 140 12/24/2020 1144   NA 142 08/02/2014 1649   K 4.1 02/19/2024 0825   K 4.2 08/02/2014 1649   CL 105 02/19/2024 0825   CL 109 (H) 08/02/2014 1649   CO2 26 02/19/2024 0825   CO2 21 08/02/2014 1649   BUN 14 02/19/2024 0825   BUN 7 12/24/2020 1144   BUN 11 08/02/2014 1649   CREATININE 0.57 (L) 02/19/2024 0825   CREATININE 0.92 08/02/2014 1649      Component Value Date/Time   CALCIUM  8.9 02/19/2024 0825   CALCIUM  8.6 08/02/2014  1649   ALKPHOS 68 02/19/2024 0825   ALKPHOS 82 08/02/2014 1649   AST 31 02/19/2024 0825   ALT 41 02/19/2024 0825   ALT 132 (H) 08/02/2014 1649   BILITOT 0.4 02/19/2024 0825       RADIOGRAPHIC STUDIES: DG THORACOLUMBAR SPINE Result Date: 02/27/2024 CLINICAL DATA:  Elective surgery. EXAM: THORACOLUMBAR SPINE 1V COMPARISON:  Radiograph 01/15/2024 FINDINGS: Three fluoroscopic spot views of the thoracolumbar spine submitted from the operating room. Remote augmentation of L2 compression fracture. Imaging obtained during posterior rod with pedicle screw fixation T12 through L4. Fluoroscopy time 21 seconds. Dose 5.09 mGy. IMPRESSION: Intraoperative fluoroscopy during thoracolumbar fusion. Electronically Signed   By: Chadwick Colonel M.D.   On: 02/27/2024 18:43   DG C-Arm 1-60 Min-No Report Result Date: 02/27/2024 Fluoroscopy was utilized by the requesting physician.  No radiographic interpretation.   DG C-Arm 1-60 Min-No Report Result Date: 02/27/2024 Fluoroscopy was utilized by the requesting physician.  No radiographic interpretation.   DG C-Arm 1-60 Min-No Report Result Date: 02/27/2024 Fluoroscopy was utilized by the requesting physician.  No radiographic interpretation.      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 He has molecular studies by foundation 1 that showed no actionable mutations and PD-L1 expression was 90%. He underwent palliative radiotherapy to the L2 vertebral body compression fraction as well as solitary brain metastasis with SRS. He is currently undergoing  palliative systemic chemotherapy with carboplatin  for AUC of 5, Alimta  500 Mg/M2 and Keytruda  200 Mg IV every 3 weeks.  Status post 26 cycles. Starting from cycle #5 he is on treatment with maintenance Alimta  and Keytruda  every 3 weeks.  He has been tolerating this treatment well with no concerning adverse effects. For the pain management, he is currently on MS Contin  15 mg p.o. twice daily in addition to Oxy IR for breakthrough pain and managed by the palliative care team.     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  Palliative systemic therapy with carboplatin  for AUC of 5, Alimta  500 Mg/M2 and Keytruda  200 Mg IV every 3 weeks.  First dose September 05, 2022.  Status post 26 cycles.  Starting from cycle #5 the patient will be on maintenance treatment with Alimta  and Keytruda  every 3 weeks. - Administer Cycle # 27 of his treatment today - Order scan 10 days prior to the next visit - Coordinate with the hospital for scan arrangement  Post-laminectomy status Two weeks post-laminectomy for spinal stabilization with significant symptom improvement, including absence of leg numbness. Surgical site healing well, scheduled for staple removal today. Procedure by Dr. Nat Badger with one-day post-operative hospital stay. - Remove staples from surgical site   The patient was advised to call immediately if he has any concerning symptoms in the interval.  The patient voices understanding of current disease status and treatment options and is in agreement with the current care plan.  All questions were answered. The patient knows to call the clinic with any problems, questions or concerns. We  can certainly see the patient much sooner if necessary.  The total time spent in the appointment was 20 minutes.  Disclaimer: This note was dictated with voice recognition software. Similar sounding words can inadvertently be transcribed and may not be corrected upon review.

## 2024-03-12 ENCOUNTER — Encounter: Payer: Self-pay | Admitting: Internal Medicine

## 2024-03-12 LAB — T4: T4, Total: 8.5 ug/dL (ref 4.5–12.0)

## 2024-03-12 NOTE — Progress Notes (Signed)
 Palliative Medicine [facility]  Telephone:(336) [phone] Fax:(336) [phone]   Name: Samuel [name] Date: 03/12/2024 MRN: [id number]  DOB: 11-06-1966  Patient Care Team: [name], MD as PCP - General (Family Medicine) Pickenpack-Cousar, Giles Labrum, NP as Nurse Practitioner (Nurse Practitioner)    INTERVAL HISTORY: Samuel [name] is a 58 y.o. 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  Palliative ask to see for symptom management and goals of care.   SOCIAL HISTORY:     reports that he has been smoking cigarettes. He started smoking about 15 months ago. He has a 0.7 pack-year smoking history. He has never used smokeless tobacco. He reports current alcohol  use of about 2.0 standard drinks of alcohol  per week. He reports that he does not use drugs.  ADVANCE DIRECTIVES:  Patient does not have an advanced directives. Education and packet provided. He has expressed interest in completing. States his sister, Samuel [name] would be his designated Clinical research associate.   CODE STATUS: Full code  PAST MEDICAL HISTORY: Past Medical History:  Diagnosis Date   Alcohol  use disorder, severe, dependence (HCC) 12/26/2016   Anxiety    Depression    Diabetes mellitus without complication (HCC)    Metastatic cancer to brain (HCC) 08/05/2022   Metastatic cancer to spine (HCC) 07/19/2022   Pancreatitis unk   Primary lung adenocarcinoma (HCC) 08/05/2022   Suicidal ideation    Type 2 diabetes mellitus with hyperglycemia, without long-term current use of insulin  (HCC) 06/13/2022   Formatting of this note might be different from the original. 06/13/2022 A1C 13.8, FSBG 414 Start empagliflozin  5mg /metformin  1000 daily, levemir 20 qhs. (Samples given) Will apply for medassist for pharmacy    ALLERGIES:  is allergic to penicillins and percocet  [oxycodone -acetaminophen ].  MEDICATIONS:  Current Outpatient Medications  Medication Sig Dispense Refill   Oxycodone  HCl 10 MG TABS Take 1 tablet (10 mg total) by mouth every 6 (six) hours as needed. 60 tablet 0   Accu-Chek Softclix Lancets lancets Use to check blood sugar 3 times daily. E11.69 100 each 6   acetaminophen  (TYLENOL ) 500 MG tablet Take 1,000 mg by mouth every 6 (six) hours as needed for moderate pain (pain score 4-6).     atorvastatin  (LIPITOR) 40 MG tablet Take 1 tablet (40 mg total) by mouth daily. 90 tablet 1   buPROPion  (WELLBUTRIN  XL) 150 MG 24 hr tablet Take 1 tablet (150 mg total) by mouth daily. 90 tablet 1   cetirizine  (ZYRTEC ) 10 MG tablet Take 1 tablet (10 mg total) by mouth daily. 30 tablet 1   Continuous Glucose Receiver (FREESTYLE LIBRE 3 READER) DEVI Use to check blood sugar continuously throughout the day. 1 each 0   Continuous Glucose Sensor (FREESTYLE LIBRE 3 PLUS SENSOR) MISC Change sensor every 15 days. Use to check blood sugar continuously. 2 each 6   doxycycline  (VIBRA -TABS) 100 MG tablet Take 1 tablet (100 mg total) by mouth 2 (two) times daily. 20 tablet 0   empagliflozin  (JARDIANCE ) 25 MG TABS tablet Take 1 tablet (25 mg total) by mouth daily before breakfast. 90 tablet 1   fluticasone  (FLONASE ) 50 MCG/ACT nasal spray Place 2 sprays into both nostrils daily. 16 g 1   folic acid  (FOLVITE ) 1 MG tablet Take 1 tablet (1 mg total) by mouth daily. 30 tablet 4   gabapentin  (NEURONTIN ) 300 MG capsule Take  2 capsules (600 mg total) by mouth 3 (three) times daily. 180 capsule 1   glucose blood (ACCU-CHEK GUIDE TEST) test strip Use as instructed twice daily 100 each 12   glucose blood (ACCU-CHEK GUIDE) test strip Use to check blood sugar 3 times daily. E11.69 100 each 6   insulin  glargine (LANTUS  SOLOSTAR) 100 UNIT/ML Solostar Pen Inject 50 Units into the skin daily. 30 mL 11   Insulin  Pen Needle 32G X 4 MM MISC Use with Insulin  pen 100 each 3   methocarbamol   (ROBAXIN ) 500 MG tablet Take 1 tablet (500 mg total) by mouth every 6 (six) hours as needed for muscle spasms. 90 tablet 0   [START ON 03/15/2024] oxyCODONE  (OXYCONTIN ) 10 mg 12 hr tablet Take 1 tablet (10 mg total) by mouth every 12 (twelve) hours. 60 tablet 0   tadalafil  (CIALIS ) 10 MG tablet Take 1 tablet (10 mg total) by mouth every other day as needed for erectile dysfunction. 10 tablet 2   No current facility-administered medications for this visit.   VITAL SIGNS: There were no vitals taken for this visit. There were no vitals filed for this visit.  Estimated body mass index is 22.81 kg/m as calculated from the following:   Height as of an earlier encounter on 03/11/24: 5\' 8"  (1.727 m).   Weight as of an earlier encounter on 03/11/24: 150 lb (68 kg).   PERFORMANCE STATUS (ECOG) : 1 - Symptomatic but completely ambulatory  Physical Exam General: NAD Cardiovascular: regular rate and rhythm Pulmonary: normal breathing pattern Extremities: no edema, no joint deformities Skin: no rashes Neurological: AAO x3  IMPRESSION: Discussed the use of AI scribe software for clinical note transcription with the patient, who gave verbal consent to proceed.  History of Present Illness [name] is a 58 year old male with history of non-small cell lung cancer, diabetes and chronic cancer related pain who presents for symptom management follow-up. No acute distress noted. Denies concerns with nausea, vomiting, constipation, or diarrhea. Appetite is good. He is remaining active.   He underwent back surgery (laminectomy) on February 27, 2024, and was discharged the following day. The incision resembles a 'zipper' with staples that are 'really close together' and about 'that long'. He experiences itching at the site, which he attributes to healing, and is eager to have the staples removed to alleviate the itching and discomfort. Reports his legs are feeling better, and he is walking okay.  We  discussed his pain at length. Kastiel reports his pain is well controlled with oxycodone  10 mg every six hours as needed and Oxycontin  every twelve hours. Post-surgery, he was prescribed oxycodone , receiving a seven-day supply of 28 pills upon discharge. No adjustments to regimen at this time. Sleeping much better. He is taking melatonin as needed at bedtime.   I discussed the importance of continued conversation with family and their medical providers regarding overall plan of care and treatment options, ensuring decisions are within the context of the patients values and GOCs. Assessment & Plan Postoperative care following back surgery Incision healing well, staples in place. Itching noted, indicating healing.  - Remove staples from back incision per Neuro.   Cancer Related Pain Pain is well controlled with oxycodone  10 mg every six hours as needed and OxyContin  every twelve hours. Reports adequate pain control with current regimen. Emphasized adherence to medication schedule for consistent pain management. - Continue oxycodone  10 mg every six hours as needed - Continue OxyContin  every twelve hours  Follow-up Follow-up appointments scheduled to monitor ongoing management of chronic conditions. - Schedule follow-up appointment in 4-6 weeks. Sooner if needed.   Patient expressed understanding and was in agreement with this plan. He also understands that He can call the clinic at any time with any questions, concerns, or complaints.   Any controlled substances utilized were prescribed in the context of palliative care. PDMP has been reviewed.   Visit consisted of counseling and education dealing with the complex and emotionally intense issues of symptom management and palliative care in the setting of serious and potentially life-threatening illness.  Dellia Ferguson, AGPCNP-BC  Palliative Medicine Team/[facility]

## 2024-03-14 ENCOUNTER — Other Ambulatory Visit: Payer: Self-pay | Admitting: Family Medicine

## 2024-03-14 ENCOUNTER — Other Ambulatory Visit (HOSPITAL_COMMUNITY): Payer: Self-pay

## 2024-03-14 ENCOUNTER — Other Ambulatory Visit: Payer: Self-pay

## 2024-03-14 ENCOUNTER — Encounter: Payer: Self-pay | Admitting: Internal Medicine

## 2024-03-14 MED ORDER — TADALAFIL 10 MG PO TABS
10.0000 mg | ORAL_TABLET | ORAL | 2 refills | Status: AC | PRN
Start: 2024-03-14 — End: ?
  Filled 2024-03-14: qty 10, 20d supply, fill #0
  Filled 2024-04-07 – 2024-04-20 (×3): qty 10, 20d supply, fill #1

## 2024-03-15 ENCOUNTER — Other Ambulatory Visit: Payer: Self-pay

## 2024-03-15 ENCOUNTER — Other Ambulatory Visit (HOSPITAL_COMMUNITY): Payer: Self-pay

## 2024-03-15 ENCOUNTER — Encounter: Payer: Self-pay | Admitting: Internal Medicine

## 2024-03-18 ENCOUNTER — Other Ambulatory Visit (HOSPITAL_COMMUNITY): Payer: Self-pay

## 2024-03-19 ENCOUNTER — Encounter: Payer: Self-pay | Admitting: Pharmacist

## 2024-03-19 ENCOUNTER — Ambulatory Visit: Payer: Self-pay | Attending: Family Medicine | Admitting: Pharmacist

## 2024-03-19 DIAGNOSIS — Z794 Long term (current) use of insulin: Secondary | ICD-10-CM | POA: Diagnosis not present

## 2024-03-19 DIAGNOSIS — Z7984 Long term (current) use of oral hypoglycemic drugs: Secondary | ICD-10-CM

## 2024-03-19 DIAGNOSIS — E1142 Type 2 diabetes mellitus with diabetic polyneuropathy: Secondary | ICD-10-CM

## 2024-03-19 NOTE — Progress Notes (Signed)
 S:     No chief complaint on file.  58 y.o. male who presents for diabetes evaluation, education, and management. PMH is significant for stage IV NSCLC (currently undergoing palliative systemic chemotherapy w/ Dr. Marguerita Shih), T2DM w/ hx of microalbuminuria & retinopathy, HLD, GAD/MDD, GERD, .   Patient was referred and last seen by Primary Care Provider, Dr. Adan Holms, on 01/18/2024. At that visit, A1c was up to 10.5 (from 8.4 prior). His Lantus  was resumed at 50u daily. I saw him in follow-up on 02/15/2024. At that visit with me, he endorsed hypoglycemia with Lantus  50u daily and self-decreased to 40u daily. He requested to split this and take 20u BID and we made this change. Additionally, we submitted a PA for CGM supplies and got approval. Confirmed via pharmacy records that patient picked up the [facility] 3 receiver and 3 + sensors on 02/22/2024.  Since that visit with me, patient underwent surgery and was hospitalized 4/8-02/28/2024 for L2 fracture. Pre-surgery A1c was 8.7 (down from 10.5 in Feb of this year). Underwent successful decompression/L2 laminectomy and stabilization T12-L4. He reported significant symptom improvement to Dr. Marguerita Shih (pt's Oncologist) at 03/11/2024 visit.    Today, patient arrives in good spirits and presents without any assistance. Continues to feel well and tells me it's getting better every day. He is already back in the gym with machine exercises. He is adherent to BID Lantus . Hypoglycemia has improved since taking 20u BID.   Current diabetes medications include: Jardiance  25mg  daily, Lantus  20 units BID  Patient reports adherence to taking all medications as prescribed.   Insurance coverage: Medicaid  Patient reports 1 hypoglycemic event in the past 2 weeks. Treated successfully. Noted sugar decreased to <60 mg/dl between 91Y-7W as he took 20u of Lantus  and worked out before bedtime.  Patient denies nocturia (nighttime urination).  Patient reports improvement in LE  neuropathy (nerve pain). Patient denies visual changes.  Patient reports self foot exams.   Patient reported dietary habits: Eats 3-4 meals/day Eats "pretty healthy most of the time" Has cookies and chips Appetite has returned  Patient-reported exercise habits: -3-4 days 1.5 hours at a time  O:  Date of Download: 4/29/205, 14 day report Average Glucose: 223 mg/dL Glucose Management Indicator: n/a. Viewing Libre 3 receiver.   Glucose Variability: n/a. Viewing Libre 3 receiver.  (goal <36%) Time in Goal:  - Time in range 70-180: 31% - Time above range: 68% - Time below range: 1% Observed patterns: -Spikes and higher avg occurs between 12p-3p.  Lab Results  Component Value Date   HGBA1C 8.7 (H) 02/27/2024   There were no vitals filed for this visit.  Lipid Panel     Component Value Date/Time   CHOL 133 07/12/2023 1116   TRIG 64 07/12/2023 1116   HDL 41 07/12/2023 1116   CHOLHDL 3.2 07/12/2023 1116   CHOLHDL 2.4 03/11/2021 1843   VLDL 23 03/11/2021 1843   LDLCALC 79 07/12/2023 1116    Clinical Atherosclerotic Cardiovascular Disease (ASCVD): Yes  The ASCVD Risk score (Arnett DK, et al., 2019) failed to calculate for the following reasons:   Risk score cannot be calculated because patient has a medical history suggesting prior/existing ASCVD   A/P: Diabetes longstanding currently above goal with A1c. Pre-surgery A1c indicates improvement and we should target a less stringent goal given his comorbidity. Hypoglycemia has improved and he is able to verbalize appropriate hypoglycemia management plan. CGM has really helped him. Regarding his insulin , he will omit his evening 20u  dose if sugar is <200 mg/dL. We have the luxury of tight monitoring with CGM, so I instructed him to take his Lantus  but at a reduced dose if pre-bedtime sugars are >150 but <200 mg/dL. If pre-bedtime sugars are >200 mg/dL, he can take the full 16X dose. Additionally, I recommend to increase AM Lantus  dose  to 24 units daily. I recognize this is only a small dose increase but I favor conservative glucose management given his morbidity, hypo-risk, and post-lower lumbar surgery status. -Increase AM dose of Lantus  to 24 units. -Continue 20 units of Lantus  before bedtime.  -If pre-bedtime sugars are under 150 mg/dL, skip bedtime Lantus  dose.  -If pre-bedtime sugars are 151-200 mg/dL, take only 10 units of Lantus  before bedtime. -If pre-bedtime sugars are >200 mg/dL, take 20 units of Lantus  before bedtime.  -Continued SGLT2-I Jardiance  (empagliflozin ) 10 mg. Counseled on sick day rules. -Extensively discussed pathophysiology of diabetes, recommended lifestyle interventions, dietary effects on blood sugar control.  -Counseled on s/sx of and management of hypoglycemia.  -Next A1c anticipated 05/2024.  Written patient instructions provided. Patient verbalized understanding of treatment plan.  Total time in face to face counseling 30 minutes.    Follow-up:  Pharmacist 1 month. PCP clinic visit 05/06/2024  Marene Shape, PharmD, BCACP, CPP Clinical Pharmacist [facility] [phone]

## 2024-03-20 ENCOUNTER — Ambulatory Visit (HOSPITAL_COMMUNITY): Admitting: Physician Assistant

## 2024-03-20 ENCOUNTER — Ambulatory Visit (HOSPITAL_COMMUNITY): Payer: Medicaid Other

## 2024-03-22 ENCOUNTER — Ambulatory Visit (INDEPENDENT_AMBULATORY_CARE_PROVIDER_SITE_OTHER): Admitting: Physician Assistant

## 2024-03-22 VITALS — BP 129/79 | HR 77 | Temp 98.2°F | Ht 68.0 in | Wt 156.0 lb

## 2024-03-22 DIAGNOSIS — F331 Major depressive disorder, recurrent, moderate: Secondary | ICD-10-CM | POA: Diagnosis not present

## 2024-03-22 DIAGNOSIS — F4310 Post-traumatic stress disorder, unspecified: Secondary | ICD-10-CM

## 2024-03-22 DIAGNOSIS — F411 Generalized anxiety disorder: Secondary | ICD-10-CM

## 2024-03-22 NOTE — Progress Notes (Unsigned)
 Psychiatric Initial Adult Assessment   Patient Identification: Samuel [name] MRN:  [id number] Date of Evaluation:  03/22/2024 Referral Source: Referred by Primary Care Provider Chief Complaint:   Chief Complaint  Patient presents with   Establish Care   Visit Diagnosis:    ICD-10-CM   1. PTSD (post-traumatic stress disorder)  F43.10     2. GAD (generalized anxiety disorder)  F41.1     3. Major depressive disorder, severe (HCC)  F32.2       History of Present Illness:    Samuel [name] is a 58 year old male with a past psychiatric history significant for major depressive disorder, anxiety, and PTSD who presents to [facility] to establish psychiatric care and for medication management.  Patient presents to the encounter stating that he is currently dealing with stage IV lung cancer and alcohol  abuse.  He reports that he recently had spine surgery a few weeks ago due to his cancer metastasizing to his spine.  In regards to his drinking, patient reports that he has been having issues with his drinking for the past 13 years.  He states that his drinking has been more manageable as of late and he drinks roughly 2-3 drinks per week.  Patient reports that he is not on any medications for drinking but states that he is currently taking Wellbutrin . Patient is currently taking Wellbutrin  150 mg daily and states that the medication is helpful.  Patient reports that he has a past psychiatric history significant for PTSD, anxiety, and major depressive disorder.  Patient reports that his depression has not been as bad; however, he reports that there are days where she just sits in the house.  Patient rates his depression at 3 out of 10 with 10 being most severe.  Patient endorses depressive episodes 2 to 3 days/week with symptoms lasting the whole day.  Patient endorses the following depressive symptoms: feelings of sadness, lack of motivation, decreased  concentration, decreased energy, irritability, feelings of guilt/worthlessness, hopelessness, decreased appetite, and decreased sleep.  He reports that his depression is worsened by his drinking.  He reports that his depression is alleviated through going to the gym and exercising consistently.  Patient endorses low anxiety but states that his main stressor revolves around his cancer diagnosis.  Patient denies experiencing panic attacks.  Patient endorses a past history of hospitalization due to mental health.  He reports that he was last hospitalized 3-1/2 to 4 years ago.  Patient denies a past history of suicide attempts.  A PHQ-9 screen was performed the patient scoring a 9.  A GAD-7 screen was also performed the patient scored a 9.  Patient is alert and oriented x 4, calm, cooperative, and fully engaged in conversation during the encounter.  Patient endorses optimistic mood.  Patient exhibits depressed mood with appropriate affect.  Patient denies suicidal or homicidal ideations.  Patient denies auditory or visual hallucinations and does not appear to be responding to internal/external stimuli.  Patient denies paranoia or delusional thoughts. Patient endorses fair sleep and receives on average 5 to 6 hours of sleep per night.  Patient endorses fair appetite and can average 2 meals per day.  Patient endorses alcohol  consumption and drinks on average 2-3 drinks per week.  Patient endorses tobacco use and smokes from [employer] cigarettes per day.  Patient endorses illicit drug use in the form of THC gummies.  Associated Signs/Symptoms: Depression Symptoms:  depressed mood, anhedonia, insomnia, hypersomnia, psychomotor agitation, fatigue, feelings  of worthlessness/guilt, difficulty concentrating, hopelessness, anxiety, loss of energy/fatigue, disturbed sleep, decreased labido, increased appetite, decreased appetite, (Hypo) Manic Symptoms:  Distractibility, Elevated Mood, Flight of  Ideas, Grandiosity, Impulsivity, Irritable Mood, Labiality of Mood, Anxiety Symptoms:  Agoraphobia, Obsessive Compulsive Symptoms:   Cleaning, Social Anxiety, Psychotic Symptoms:   Patient denies PTSD Symptoms: Had a traumatic exposure:  Patient reports that his time in prison was traumatic. Patient reports that battling cancer has been impactful. Patient states that his history of blacking out was impactful to him. Had a traumatic exposure in the last month:  N/A Re-experiencing:  Flashbacks Intrusive Thoughts Nightmares Hypervigilance:  Yes Hyperarousal:  Difficulty Concentrating Emotional Numbness/Detachment Sleep Avoidance:  Decreased Interest/Participation  Past Psychiatric History:  Patient endorses a past psychiatric history significant for anxiety, major depressive disorder, and PTSD.  Patient endorses a past history of hospitalization due to mental health. He reports that his last hospitalization occurred 3.5 - 4 years ago. - Patient reports that he was hospitalized at [facility] due to stress and crying spells related to his current living situation.  Patient denies a past history of suicide attempt.  Patient denies a past history of homicide attempt.  Previous Psychotropic Medications: Yes   Substance Abuse History in the last 12 months:  Yes.    Consequences of Substance Abuse: Patient reports that he has a history of alcohol  and cigarette abuse.  Medical Consequences:  Patient reports that he has been hospitalized 10 - 15x due to his alcohol  use Legal Consequences:  Patient reports that he has had 5 DUIs Family Consequences:  Patient reports that his family alienated him due to his alcohol  use Blackouts:  Patient endorses a past history of blackouts DT's: Patient endorses a past hist of DTs Withdrawal Symptoms:   Tremors Cravings  Past Medical History:  Past Medical History:  Diagnosis Date   Alcohol  use disorder, severe, dependence  (HCC) 12/26/2016   Anxiety    Depression    Diabetes mellitus without complication (HCC)    Metastatic cancer to brain (HCC) 08/05/2022   Metastatic cancer to spine (HCC) 07/19/2022   Pancreatitis unk   Primary lung adenocarcinoma (HCC) 08/05/2022   Suicidal ideation    Type 2 diabetes mellitus with hyperglycemia, without long-term current use of insulin  (HCC) 06/13/2022   Formatting of this note might be different from the original. 06/13/2022 A1C 13.8, FSBG 414 Start empagliflozin  5mg /metformin  1000 daily, levemir 20 qhs. (Samples given) Will apply for medassist for pharmacy    Past Surgical History:  Procedure Laterality Date   APPLICATION OF ROBOTIC ASSISTANCE FOR SPINAL PROCEDURE N/A 02/27/2024   Procedure: APPLICATION OF ROBOTIC ASSISTANCE FOR SPINAL PROCEDURE;  Surgeon: Augusto Blonder, MD;  Location: [facility] OR;  Service: Neurosurgery;  Laterality: N/A;   IR BONE TUMOR(S)RF ABLATION  08/01/2022   IR IMAGING GUIDED PORT INSERTION  09/21/2022   IR KYPHO LUMBAR INC FX REDUCE BONE BX UNI/BIL CANNULATION INC/IMAGING  08/01/2022   LAMINECTOMY WITH POSTERIOR LATERAL ARTHRODESIS LEVEL 4 N/A 02/27/2024   Procedure: Lumbar two LAMINECTOMY, Thoracic twelve-Lumbar four INSTRUMENTED STABILIZATION;  Surgeon: Augusto Blonder, MD;  Location: [facility] OR;  Service: Neurosurgery;  Laterality: N/A;    Family Psychiatric History:  Patient denies a family history of  Family History: History reviewed. No pertinent family history.  Social History:   Social History   Socioeconomic History   Marital status: Single    Spouse name: Not on file   Number of children: 0   Years of education: Not on  file   Highest education level: Associate degree: academic program  Occupational History   Not on file  Tobacco Use   Smoking status: Every Day    Current packs/day: 0.50    Average packs/day: 0.5 packs/day for 1.4 years (0.7 ttl pk-yrs)    Types: Cigarettes    Start date: 11/16/2022    Last attempt to quit:  08/17/2022   Smokeless tobacco: Never  Vaping Use   Vaping status: Never Used  Substance and Sexual Activity   Alcohol  use: Yes    Alcohol /week: 2.0 standard drinks of alcohol     Types: 2 Cans of beer per week   Drug use: No   Sexual activity: Yes  Other Topics Concern   Not on file  Social History Narrative   Not on file   Social Drivers of Health   Financial Resource Strain: Medium Risk (03/17/2023)   Overall Financial Resource Strain (CARDIA)    Difficulty of Paying Living Expenses: Somewhat hard  Food Insecurity: No Food Insecurity (12/27/2023)   Hunger Vital Sign    Worried About Running Out of Food in the Last Year: Never true    Ran Out of Food in the Last Year: Never true  Transportation Needs: No Transportation Needs (12/27/2023)   PRAPARE - Administrator, Civil Service (Medical): No    Lack of Transportation (Non-Medical): No  Physical Activity: Insufficiently Active (03/17/2023)   Exercise Vital Sign    Days of Exercise per Week: 2 days    Minutes of Exercise per Session: 20 min  Stress: Stress Concern Present (03/17/2023)   [employer] of Occupational Health - Occupational Stress Questionnaire    Feeling of Stress : To some extent  Social Connections: Unknown (05/09/2023)   Received from [place], Laurence Pons   Social Connections    How often do you feel lonely or isolated from those around you? (Adult - for ages 66 years and over): Not on file  Recent Concern: Social Connections - Moderately Isolated (03/17/2023)   Social Connection and Isolation Panel [NHANES]    Frequency of Communication with Friends and Family: More than three times a week    Frequency of Social Gatherings with Friends and Family: More than three times a week    Attends Religious Services: More than 4 times per year    Active Member of [employer] or Organizations: No    Attends Engineer, structural: Not on file    Marital Status: Divorced    Additional Social History:   Patient endorses social support.  Patient denies having children of his own.  Patient endorses housing.  Patient is currently unemployed but receives SSI.  Patient denies Research officer, trade union.  Patient endorses a past history of prison time.  Patient reports that he has his associates degree.  Patient denies access to weapons.  Allergies:   Allergies  Allergen Reactions   Penicillins Hives   Percocet [Oxycodone -Acetaminophen ] Nausea Only and Other (See Comments)    Pt states the pill is hard to swallow. States he was told it may be the coating on the tablet that he is allergic to.     Metabolic Disorder Labs: Lab Results  Component Value Date   HGBA1C 8.7 (H) 02/27/2024   MPG 202.99 02/27/2024   MPG 194.38 08/28/2023   No results found for: "PROLACTIN" Lab Results  Component Value Date   CHOL 133 07/12/2023   TRIG 64 07/12/2023   HDL 41 07/12/2023   CHOLHDL 3.2 07/12/2023   VLDL  23 03/11/2021   LDLCALC 79 07/12/2023   LDLCALC 61 01/19/2023   Lab Results  Component Value Date   TSH 0.490 03/11/2024    Therapeutic Level Labs: No results found for: "LITHIUM" No results found for: "CBMZ" No results found for: "VALPROATE"  Current Medications: Current Outpatient Medications  Medication Sig Dispense Refill   Accu-Chek Softclix Lancets lancets Use to check blood sugar 3 times daily. E11.69 100 each 6   acetaminophen  (TYLENOL ) 500 MG tablet Take 1,000 mg by mouth every 6 (six) hours as needed for moderate pain (pain score 4-6).     atorvastatin  (LIPITOR) 40 MG tablet Take 1 tablet (40 mg total) by mouth daily. 90 tablet 1   buPROPion  (WELLBUTRIN  XL) 150 MG 24 hr tablet Take 1 tablet (150 mg total) by mouth daily. 90 tablet 1   cetirizine  (ZYRTEC ) 10 MG tablet Take 1 tablet (10 mg total) by mouth daily. 30 tablet 1   Continuous Glucose Receiver (FREESTYLE LIBRE 3 READER) DEVI Use to check blood sugar continuously throughout the day. 1 each 0   Continuous Glucose Sensor  (FREESTYLE LIBRE 3 PLUS SENSOR) MISC Change sensor every 15 days. Use to check blood sugar continuously. 2 each 6   doxycycline  (VIBRA -TABS) 100 MG tablet Take 1 tablet (100 mg total) by mouth 2 (two) times daily. 20 tablet 0   empagliflozin  (JARDIANCE ) 25 MG TABS tablet Take 1 tablet (25 mg total) by mouth daily before breakfast. 90 tablet 1   fluticasone  (FLONASE ) 50 MCG/ACT nasal spray Place 2 sprays into both nostrils daily. 16 g 1   folic acid  (FOLVITE ) 1 MG tablet Take 1 tablet (1 mg total) by mouth daily. 30 tablet 4   gabapentin  (NEURONTIN ) 300 MG capsule Take 2 capsules (600 mg total) by mouth 3 (three) times daily. 180 capsule 1   glucose blood (ACCU-CHEK GUIDE TEST) test strip Use as instructed twice daily 100 each 12   glucose blood (ACCU-CHEK GUIDE) test strip Use to check blood sugar 3 times daily. E11.69 100 each 6   insulin  glargine (LANTUS  SOLOSTAR) 100 UNIT/ML Solostar Pen Inject 50 Units into the skin daily. 30 mL 11   Insulin  Pen Needle 32G X 4 MM MISC Use with Insulin  pen 100 each 3   methocarbamol  (ROBAXIN ) 500 MG tablet Take 1 tablet (500 mg total) by mouth every 6 (six) hours as needed for muscle spasms. 90 tablet 0   oxyCODONE  (OXYCONTIN ) 10 mg 12 hr tablet Take 1 tablet (10 mg total) by mouth every 12 (twelve) hours. 60 tablet 0   Oxycodone  HCl 10 MG TABS Take 1 tablet (10 mg total) by mouth every 6 (six) hours as needed. 60 tablet 0   tadalafil  (CIALIS ) 10 MG tablet Take 1 tablet (10 mg total) by mouth every other day as needed for erectile dysfunction. 10 tablet 2   No current facility-administered medications for this visit.    Musculoskeletal: Strength & Muscle Tone: within normal limits Gait & Station: normal Patient leans: N/A  Psychiatric Specialty Exam: Review of Systems  Psychiatric/Behavioral:  Positive for dysphoric mood and sleep disturbance. Negative for decreased concentration, hallucinations, self-injury and suicidal ideas. The patient is  nervous/anxious. The patient is not hyperactive.     Blood pressure 129/79, pulse 77, temperature 98.2 F (36.8 C), temperature source Oral, height 5\' 8"  (1.727 m), weight 156 lb (70.8 kg), SpO2 98%.Body mass index is 23.72 kg/m.  General Appearance: Casual  Eye Contact:  Good  Speech:  Clear and Coherent  and Normal Rate  Volume:  Normal  Mood:  Anxious and Depressed  Affect:  Congruent  Thought Process:  Coherent and Descriptions of Associations: Intact  Orientation:  Full (Time, Place, and Person)  Thought Content:  WDL  Suicidal Thoughts:  No  Homicidal Thoughts:  No  Memory:  Immediate;   Good Recent;   Good Remote;   Good  Judgement:  Good  Insight:  Good  Psychomotor Activity:  Normal  Concentration:  Concentration: Good and Attention Span: Good  Recall:  Good  Fund of Knowledge:Good  Language: Good  Akathisia:  No  Handed:  Right  AIMS (if indicated):  not done  Assets:  Communication Skills Desire for Improvement Financial Resources/Insurance Housing Social Support Transportation  ADL's:  Intact  Cognition: WNL  Sleep:  Fair   Screenings: AIMS    Flowsheet Row [facility] (Discharged[facility] from [facility]23[facility]2016 [facility]  AIMS Total Score 0 0      AUDIT    Flowsheet Row [facility] Ad[facility] (Discharged) from 12[facility]23/2016 in [facility]PAT[facility] BE[facility]AL MEDICINE  Alcohol  Use Disorder Identification Test Final Score (AUDIT) 10 29      CAGE-AID    Flowsheet Row Office Visit from 03/22/2024 in [facility]  CAGE-AID Score 4      GAD-7    Flowsheet Row Office Visit from 03/22/2024 in [facility] Office Visit from 01/18/2024 in [facility] - A Dept Of [place]. [facility] Office Visit from 07/12/2023 in  [facility] - A Dept Of Tommas Fragmin. [facility] Office Visit from 04/11/2023 in [facility] - A Dept Of Tom[facility] Fragmin. [facility] Office Visit from 01/19/2023 in [facility] - A Dept Of [place]. [facility]  Total GAD-7 Score 9 9 11 10 4       PHQ2-9    Flowsheet Row Office Visit from 03/22/2024 in [facility] Office Visit from 01/18/2024 in [facility] - A Dept Of [place]. [facility] Office Visit from 07/12/2023 in [facility] - A Dept Of [place] Frag[place]. [facility] Office Visit from 04/11/2023 in [name] - A Dept Of Tommas Fragmin. [facility] Office Visit from 01/19/2023 in [facility] [place] - A Dept Of Tommas Fragmin. [facility]  PHQ-2 Total Score 2 2 4 3 2   PHQ-9 Total Score 9 10 16 11 7       Flowsheet Row Office Visit from 03/22/2024 in [facility] Admission (Discharged) from 02/27/2024 in [facility] ED to Hosp-Admission (Discharged) from 08/28/2023 in [facility]  C-SSRS RISK CATEGORY No Risk No Risk No Risk       Assessment and Plan:   Samuel [name] is a 58 year old male with a past psychiatric history significant for major depressive disorder, anxiety, and PTSD who presents to [facility] to establish psychiatric care and for medication management.  Patient presents to the encounter stating that he has been dealing with stage IV lung cancer and alcohol  abuse.  He reports that his alcohol  abuse has been somewhat manageable and states that he  now drinks roughly 2-3 drinks per week.  Patient states that he is currently being managed on Wellbutrin  150 mg daily and states that has been helpful with managing his mood and tobacco use.  Patient  continues to endorse depression, but states that it is not as bad since being placed on his Wellbutrin .  Patient endorses low anxiety but states that his main stressor revolves around his cancer diagnosis.  A PHQ-9 screen was performed with the patient scored a 9.  A GAD-7 screen was also performed with the patient scoring a 9.  Despite endorsing some depression, patient would like to continue taking his Wellbutrin  as prescribed and endorses stability through its use.  Provider discussed with patient medication options for managing his alcohol  consumption.  Patient reports that his alcohol  use is not an issue at this time and states that he will consider medication management in the future if his alcohol  consumption worsens.  Patient denies suicidal ideations and is able to contract for safety following the conclusion of the encounter.  Patient has a primary care provider and is currently being seen by oncology.  Collaboration of Care: Medication Management AEB provider managing patient's psychiatric medications, Primary Care Provider AEB patient being followed by a primary care provider (internal medicine), Psychiatrist AEB patient being followed by a mental health provider, Other provider involved in patient's care AEB Patient being seen by oncology, and Referral or follow-up with counselor/therapist AEB patient being seen by a licensed clinical social worker at this facility  Patient/Guardian was advised Release of Information must be obtained prior to any record release in order to collaborate their care with an outside provider. Patient/Guardian was advised if they have not already done so to contact the registration department to sign all necessary forms in order for us  to release information regarding their care.   Consent: Patient/Guardian gives verbal consent for treatment and assignment of benefits for services provided during this visit. Patient/Guardian expressed understanding and agreed to  proceed.   1. PTSD (post-traumatic stress disorder) (Primary)  2. GAD (generalized anxiety disorder)  3. Moderate episode of recurrent major depressive disorder (HCC) Patient to continue taking Bupropion  (Wellbutrin  XL) 150 mg 24 hr tablet daily for the management of his major depressive disorder  Patient to follow up in 5 weeks Provider spent a total of 40 minutes with the patient/reviewing the patient's chart  Gates Kasal, PA 5/2/202510:26 AM

## 2024-03-25 ENCOUNTER — Encounter (HOSPITAL_COMMUNITY): Payer: Self-pay | Admitting: Physician Assistant

## 2024-03-25 ENCOUNTER — Ambulatory Visit (HOSPITAL_COMMUNITY)
Admission: RE | Admit: 2024-03-25 | Discharge: 2024-03-25 | Disposition: A | Discharge status: Home / Self Care | Source: Ambulatory Visit | Attending: Radiation Oncology | Admitting: Radiation Oncology

## 2024-03-25 ENCOUNTER — Inpatient Hospital Stay: Payer: Medicaid Other

## 2024-03-25 DIAGNOSIS — C7931 Secondary malignant neoplasm of brain: Secondary | ICD-10-CM | POA: Insufficient documentation

## 2024-03-25 DIAGNOSIS — C7949 Secondary malignant neoplasm of other parts of nervous system: Secondary | ICD-10-CM | POA: Diagnosis present

## 2024-03-25 DIAGNOSIS — F4310 Post-traumatic stress disorder, unspecified: Secondary | ICD-10-CM | POA: Insufficient documentation

## 2024-03-25 MED ORDER — GADOBUTROL 1 MMOL/ML IV SOLN
7.0000 mL | Freq: Once | INTRAVENOUS | Status: AC | PRN
  Administered 2024-03-25: 7 mL via INTRAVENOUS

## 2024-03-26 ENCOUNTER — Ambulatory Visit (HOSPITAL_COMMUNITY)
Admission: RE | Admit: 2024-03-26 | Discharge: 2024-03-26 | Disposition: A | Discharge status: Home / Self Care | Source: Ambulatory Visit | Attending: Internal Medicine | Admitting: Internal Medicine

## 2024-03-26 ENCOUNTER — Encounter (HOSPITAL_COMMUNITY): Payer: Self-pay

## 2024-03-26 DIAGNOSIS — C3490 Malignant neoplasm of unspecified part of unspecified bronchus or lung: Secondary | ICD-10-CM | POA: Insufficient documentation

## 2024-03-26 MED ORDER — IOHEXOL 300 MG/ML  SOLN
100.0000 mL | Freq: Once | INTRAMUSCULAR | Status: AC | PRN
  Administered 2024-03-26: 100 mL via INTRAVENOUS

## 2024-03-26 NOTE — Progress Notes (Signed)
 Telephone nursing appointment for review of most recent MRI-Brain results. I verified patient's identity x2 and began nursing interview.   Patient states issues as follows...  -Pain: 4/10 Central lower back -Fatigue: Denies -Hair Loss: Denies -Skin: Denies -Weakness: Denies -Loss of control of extremities: Denies -Headache: Denies -Seizure/ uncontrolled movement: Denies -Vision: Denies -Speech: Denies -Confusion: Denies -Dexamethasone / steroids: "Pt sates he DENIES STEROID USE."   Patient denies any other related issues at this time.   Meaningful use complete.   Patient aware of their telephone appointment w/ Ashlyn Bruning PA-C. I left my extension [phone] in case patient needs anything. Patient verbalized understanding. This concludes the nursing interview.   Patient preferred phone # [phone]   Avery Bodo, LPN

## 2024-03-27 ENCOUNTER — Ambulatory Visit: Payer: Medicaid Other | Admitting: Urology

## 2024-03-28 ENCOUNTER — Encounter: Payer: Self-pay | Admitting: Urology

## 2024-03-28 ENCOUNTER — Ambulatory Visit
Admission: RE | Admit: 2024-03-28 | Discharge: 2024-03-28 | Disposition: A | Discharge status: Home / Self Care | Source: Ambulatory Visit | Attending: Urology | Admitting: Urology

## 2024-03-28 DIAGNOSIS — C7931 Secondary malignant neoplasm of brain: Secondary | ICD-10-CM

## 2024-03-28 NOTE — Progress Notes (Signed)
 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  Interval Since Last Radiation:  1 year, 4 months 08/17/2022 through 08/30/2022 Site Technique Total Dose (Gy) Dose per Fx (Gy) Completed Fx Beam Energies  Lumbar Spine: Spine 3D 30/30 3 10/10 10X, 15X  Brain: Brain_SRS IMRT 20/20 20 1/1 6XFFF   Narrative:  I spoke with the patient to conduct his routine scheduled 3 month follow up visit to review results of his recent MRI brain scan via telephone to spare the patient unnecessary potential exposure in the healthcare setting.  The patient was notified in advance and gave permission to proceed with this visit format.  He tolerated radiation therapy very well and remains without complaints. He has continued to tolerate the systemic therapy well and completed 4 cycles of the carboplatin /Alimta /Keytruda  before transitioning to maintenance therapy with Alimta /Keytruda  only, starting from cycle 5. He feels like his energy and strength have significantly improved since coming off the carboplatinin and he has been able to gradually increase his activity level which he is pleased with.   MRI of the lumbar and thoracic spine were performed 06/09/23 and showed stability of the treated metastatic disease in the lumbar spine (L2) and an overall unchanged appearance of the metastatic disease in the thoracic and lumbar spine compared to the MRI from 03/01/2023 with no new lesions or epidural soft tissue tumor identified. However, he became more symptomatic with some occasional numbness/tingling in his lower extremities and mild-moderate back pain in the mid to lower back that was becoming less  well-controlled with pain medications. Therefore, we referred him for evaluation with neurosurgery and a repeat lumbar spine MRI from 01/04/24 demonstrated previous near vertebra plana L2 with previous vertebral augmentation. There was a portion of retropulsed bone contributing to moderate stenosis at this level with some slight reversal of the normal lumbar lordosis focused at this level. Dynamic thoracolumbar spine x-rays done in the neurosurgery office demonstrated exaggerated focal kyphosis centered about the L2 fracture, most notable comparing upright extended to flexed x-rays. He subsequently underwent L2 decompression and instrumented stabilization of T12-L4 under the care of Dr. Nat Badger and Dr. Ellery Guthrie on 02/27/24. He has recovered well from the procedure and reports significant improvement in the back pain and resolution of the paraesthesias in the LEs. He is regaining strength in the LEs daily, now able to work out in the gym more consistently which he is very pleased with.  His restaging CT C/A/P from 03/26/24 continues to show an excellent response to treatment with no evidence for recurrence or extraosseous metastatic disease in the thorax, abdomen or pelvis and a stable appearance of the osseous metastatic disease in the thoracolumbar spine. The plan is to continue his current systemic therapy and he has a follow up visit with Dr. Marguerita Shih on 04/01/24.   He had a recent follow up MRI brain scan on 03/25/24 that showed resolution of the treated left temporal and right occipital lesions and a similar appearance of the punctate lesion in the inferior right cerebellum. No new lesions were identified. We reviewed these results by telephone today.  On review of systems, the patient states that he is  doing well in general. He reports significant improvement in his back pain and resolution of paraesthesias in the LEs as noted above in the HPI. He continues using a back brace for support and has been able to  workout at [employer] more consistently to help with stretching, strength and balance.  He has not noticed any change in his bowel or bladder habits.  He does have occasional muscle cramps/tightness in the low back, at the surgical site, particularly in the mornings but these usually resolve spontaneously throughout the day with repositioning, stretching and massage. He denies headaches, nausea, vomiting, dizziness, slurred speech, changes in visual or auditory acuity or tremors.  Overall, he is quite pleased with his progress to date.  ALLERGIES:  is allergic to penicillins and percocet [oxycodone -acetaminophen ].  Meds: Current Outpatient Medications  Medication Sig Dispense Refill   Accu-Chek Softclix Lancets lancets Use to check blood sugar 3 times daily. E11.69 100 each 6   acetaminophen  (TYLENOL ) 500 MG tablet Take 1,000 mg by mouth every 6 (six) hours as needed for moderate pain (pain score 4-6).     atorvastatin  (LIPITOR) 40 MG tablet Take 1 tablet (40 mg total) by mouth daily. 90 tablet 1   buPROPion  (WELLBUTRIN  XL) 150 MG 24 hr tablet Take 1 tablet (150 mg total) by mouth daily. 90 tablet 1   cetirizine  (ZYRTEC ) 10 MG tablet Take 1 tablet (10 mg total) by mouth daily. 30 tablet 1   Continuous Glucose Receiver (FREESTYLE LIBRE 3 READER) DEVI Use to check blood sugar continuously throughout the day. 1 each 0   Continuous Glucose Sensor (FREESTYLE LIBRE 3 PLUS SENSOR) MISC Change sensor every 15 days. Use to check blood sugar continuously. 2 each 6   doxycycline  (VIBRA -TABS) 100 MG tablet Take 1 tablet (100 mg total) by mouth 2 (two) times daily. 20 tablet 0   empagliflozin  (JARDIANCE ) 25 MG TABS tablet Take 1 tablet (25 mg total) by mouth daily before breakfast. 90 tablet 1   fluticasone  (FLONASE ) 50 MCG/ACT nasal spray Place 2 sprays into both nostrils daily. 16 g 1   folic acid  (FOLVITE ) 1 MG tablet Take 1 tablet (1 mg total) by mouth daily. 30 tablet 4   gabapentin  (NEURONTIN ) 300 MG  capsule Take 2 capsules (600 mg total) by mouth 3 (three) times daily. 180 capsule 1   glucose blood (ACCU-CHEK GUIDE TEST) test strip Use as instructed twice daily 100 each 12   glucose blood (ACCU-CHEK GUIDE) test strip Use to check blood sugar 3 times daily. E11.69 100 each 6   insulin  glargine (LANTUS  SOLOSTAR) 100 UNIT/ML Solostar Pen Inject 50 Units into the skin daily. 30 mL 11   Insulin  Pen Needle 32G X 4 MM MISC Use with Insulin  pen 100 each 3   methocarbamol  (ROBAXIN ) 500 MG tablet Take 1 tablet (500 mg total) by mouth every 6 (six) hours as needed for muscle spasms. 90 tablet 0   oxyCODONE  (OXYCONTIN ) 10 mg 12 hr tablet Take 1 tablet (10 mg total) by mouth every 12 (twelve) hours. 60 tablet 0   Oxycodone  HCl 10 MG TABS Take 1 tablet (10 mg total) by mouth every 6 (six) hours as needed. 60 tablet 0   tadalafil  (CIALIS ) 10 MG tablet Take 1 tablet (10 mg total) by mouth every other day as needed for erectile dysfunction. 10 tablet 2   No current facility-administered medications for this encounter.    Physical Findings:  vitals were not taken for this visit.  Pain Assessment  Pain Score: 4  Pain Loc: Back (Central lower back)/10 Unable to assess due to telephone follow-up visit format.  Lab Findings: Lab Results  Component Value Date   WBC 11.5 (H) 03/11/2024   HGB 12.1 (L) 03/11/2024   HCT 35.9 (L) 03/11/2024   MCV 94.7 03/11/2024   PLT 405 (H) 03/11/2024     Radiographic Findings: MR Brain W Wo Contrast Result Date: 03/27/2024 CLINICAL DATA:  Brain metastasis, assess treatment response. EXAM: MRI HEAD WITHOUT AND WITH CONTRAST TECHNIQUE: Multiplanar, multiecho pulse sequences of the brain and surrounding structures were obtained without and with intravenous contrast. CONTRAST:  7mL GADAVIST  GADOBUTROL  1 MMOL/ML IV SOLN COMPARISON:  12/20/2023 and earlier. FINDINGS: Brain: Redemonstrated punctate focus of intrinsic T1 hyperintensity within the inferior medial right cerebellum  (series 600 image 124). No associated enhancement or surrounding edema. Punctate associated susceptibility is similar to prior. No enhancing lesions. No new intracranial lesions noted. No acute infarct. No evidence of intracranial hemorrhage. White matter is unremarkable. No midline shift. Normal appearance of midline structures. The basilar cisterns are patent. No extra-axial fluid collections. Ventricles: Normal size and configuration of the ventricles. Vascular: Skull base flow voids are visualized. Skull and upper cervical spine: No focal abnormality. Left occipital subgaleal lipoma. Sinuses/Orbits: Mucosal thickening in the right posterior ethmoid air cells. Additional mild mucosal thickening in the bilateral ethmoid and maxillary sinuses. No air-fluid levels. Other: Trace fluid in the bilateral mastoid tips. IMPRESSION: Similar appearance of inferior right cerebellar lesion with punctate intrinsic T1 hyperintensity. No associated enhancement or edema. No new intracranial lesions. Electronically Signed   By: Denny Flack M.D.   On: 03/27/2024 09:38   CT Chest W Contrast Result Date: 03/26/2024 EXAM: CT CHEST WITH CONTRAST 03/26/2024 09:38:20 AM TECHNIQUE: CT of the chest was performed with the administration of intravenous contrast. Multiplanar reformatted images are provided for review. Automated exposure control, iterative reconstruction, and/or weight based adjustment of the mA/kV was utilized to reduce the radiation dose to as low as reasonably achievable. COMPARISON: 01/01/2024 CT chest, abdomen, and pelvis CLINICAL HISTORY: Non-small cell lung cancer (NSCLC), staging. Lung cancer 3 month follow up FINDINGS: MEDIASTINUM: Right internal jugular port-a-cath terminates at the cavoatrial junction. Left main coronary atherosclerosis. Atherosclerotic nonaneurysmal thoracic aorta. LYMPH NODES: No evidence of mediastinal, hilar or axillary lymphadenopathy. LUNGS AND PLEURA: Mild paraseptal and centrilobular  emphysema. No focal consolidation or pulmonary edema. No evidence of pleural effusion or pneumothorax. SOFT TISSUES/BONES: Numerous healed bilateral rib fractures. Stable subtle small faintly sclerotic posterior T3 vertebral lesion. Bilateral posterior spinal fusion hardware extending inferiorly from the T12 level. No new focal osseous lesions in the chest. UPPER ABDOMEN: Limited images of the upper abdomen demonstrates no acute abnormality. IMPRESSION: 1. Stable subtle sclerotic T3 vertebral metastasis. 2. No new or progressive metastatic disease in the chest. Electronically signed by: Karlyn Overman MD 03/26/2024 11:24 PM EDT RP Workstation: [id number]   CT ABDOMEN PELVIS W CONTRAST Result Date: 03/26/2024 EXAMINATION: CT ABDOMEN PELVIS W CONTRAST CLINICAL INDICATION: Male, 58 years old. Non-small cell lung cancer (NSCLC), staging TECHNIQUE: Axial CT of the abdomen and pelvis with 100 cc Omnipaque  300 intravenous contrast. Multiplanar reformations provided. Unless otherwise specified, incidental thyroid , adrenal, renal lesions do not require dedicated imaging follow up. Additionally, any mentioned pulmonary nodules do not require dedicated imaging follow-up based on the Fleischner guidelines unless otherwise specified. Coronary calcifications are not identified unless otherwise specified. COMPARISON: 01/01/2024 FINDINGS: Regarding findings of the lung bases, please refer to the chest report. The liver  appears normal. The gallbladder is normal. The spleen is normal. The pancreas is normal. The left adrenal is normal. Stable nonspecific similar right adrenal gland thickening. The kidneys are normal. Abdominal aorta is normal in caliber. Scattered atherosclerotic changes are present. The urinary bladder is normal. Large and small bowel loops are otherwise within normal limits. No free fluid or pathologic adenopathy by size criteria. Old L2 pathologic T2 burst fracture again noted status post vertebral augmentation with  interval fixation from T12 through L4. L3 hemangioma again noted. IMPRESSION: Redemonstration of an old pathologic L2 compression fracture status post vertebral augmentation and interval spinal fusion. Similar nonspecific right adrenal gland thickening. DOSE REDUCTION: This exam was performed according to our departmental dose-optimization program which includes automated exposure control, adjustment of the mA and/or kV according to patient size and/or use of iterative reconstruction technique. Electronically signed by: [place] Engel MD 03/26/2024 01:14 PM EDT RP Workstation: [id number]   DG THORACOLUMBAR SPINE Result Date: 02/27/2024 CLINICAL DATA:  Elective surgery. EXAM: THORACOLUMBAR SPINE 1V COMPARISON:  Radiograph 01/15/2024 FINDINGS: Three fluoroscopic spot views of the thoracolumbar spine submitted from the operating room. Remote augmentation of L2 compression fracture. Imaging obtained during posterior rod with pedicle screw fixation T12 through L4. Fluoroscopy time 21 seconds. Dose 5.09 mGy. IMPRESSION: Intraoperative fluoroscopy during thoracolumbar fusion. Electronically Signed   By: Chadwick Colonel M.D.   On: 02/27/2024 18:43   DG C-Arm 1-60 Min-No Report Result Date: 02/27/2024 Fluoroscopy was utilized by the requesting physician.  No radiographic interpretation.   DG C-Arm 1-60 Min-No Report Result Date: 02/27/2024 Fluoroscopy was utilized by the requesting physician.  No radiographic interpretation.   DG C-Arm 1-60 Min-No Report Result Date: 02/27/2024 Fluoroscopy was utilized by the requesting physician.  No radiographic interpretation.    Impression/Plan: 1. 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  He has recovered well from the effects of the radiation.  His brain disease continues to appear well controlled on recent MRI brain scan. We will continue to monitor the brain with repeat  MRI scans every 3 months and a telephone follow-up visit thereafter to review results and recommendations from the multidisciplinary brain and spine conference. He will also continue in routine follow up with Dr. Marguerita Shih, for continued management of his systemic disease and is scheduled for a follow up visit with him on 04/01/24. He has a good understanding of our recommendations and is comfortable and in agreement with the stated plan.  Regarding the metastatic disease in the spine, he will continue in follow up with neurosurgery and if  there is any clinical indication for additional radiation in the future, we are happy to offer further treatment. We will plan to connect again following his MRI brain scan in 06/2024 but he knows to call with any questions or concerns in the interim.  I personally spent 30 minutes in this encounter including chart review, reviewing radiological studies, telephone conversation with the patient, entering orders and completing documentation.    Arta Bihari, PA-C

## 2024-03-30 ENCOUNTER — Other Ambulatory Visit: Payer: Self-pay | Admitting: Nurse Practitioner

## 2024-03-30 DIAGNOSIS — C7951 Secondary malignant neoplasm of bone: Secondary | ICD-10-CM

## 2024-04-01 ENCOUNTER — Encounter: Payer: Self-pay | Admitting: Internal Medicine

## 2024-04-01 ENCOUNTER — Inpatient Hospital Stay: Attending: Adult Health | Admitting: Internal Medicine

## 2024-04-01 ENCOUNTER — Other Ambulatory Visit: Payer: Self-pay

## 2024-04-01 ENCOUNTER — Inpatient Hospital Stay

## 2024-04-01 ENCOUNTER — Inpatient Hospital Stay (HOSPITAL_BASED_OUTPATIENT_CLINIC_OR_DEPARTMENT_OTHER): Admitting: Nurse Practitioner

## 2024-04-01 ENCOUNTER — Other Ambulatory Visit (HOSPITAL_COMMUNITY): Payer: Self-pay

## 2024-04-01 ENCOUNTER — Encounter: Payer: Self-pay | Admitting: Nurse Practitioner

## 2024-04-01 VITALS — BP 118/72 | HR 79 | Temp 97.6°F | Resp 16 | Ht 68.0 in | Wt 157.0 lb

## 2024-04-01 DIAGNOSIS — E119 Type 2 diabetes mellitus without complications: Secondary | ICD-10-CM | POA: Diagnosis not present

## 2024-04-01 DIAGNOSIS — F17210 Nicotine dependence, cigarettes, uncomplicated: Secondary | ICD-10-CM | POA: Diagnosis not present

## 2024-04-01 DIAGNOSIS — G893 Neoplasm related pain (acute) (chronic): Secondary | ICD-10-CM

## 2024-04-01 DIAGNOSIS — R202 Paresthesia of skin: Secondary | ICD-10-CM | POA: Diagnosis not present

## 2024-04-01 DIAGNOSIS — C7951 Secondary malignant neoplasm of bone: Secondary | ICD-10-CM | POA: Insufficient documentation

## 2024-04-01 DIAGNOSIS — C3490 Malignant neoplasm of unspecified part of unspecified bronchus or lung: Secondary | ICD-10-CM | POA: Diagnosis not present

## 2024-04-01 DIAGNOSIS — C778 Secondary and unspecified malignant neoplasm of lymph nodes of multiple regions: Secondary | ICD-10-CM | POA: Diagnosis not present

## 2024-04-01 DIAGNOSIS — J90 Pleural effusion, not elsewhere classified: Secondary | ICD-10-CM | POA: Insufficient documentation

## 2024-04-01 DIAGNOSIS — Z79899 Other long term (current) drug therapy: Secondary | ICD-10-CM | POA: Diagnosis not present

## 2024-04-01 DIAGNOSIS — C7931 Secondary malignant neoplasm of brain: Secondary | ICD-10-CM | POA: Insufficient documentation

## 2024-04-01 DIAGNOSIS — F1020 Alcohol dependence, uncomplicated: Secondary | ICD-10-CM | POA: Diagnosis not present

## 2024-04-01 DIAGNOSIS — R530 Neoplastic (malignant) related fatigue: Secondary | ICD-10-CM

## 2024-04-01 DIAGNOSIS — Z7984 Long term (current) use of oral hypoglycemic drugs: Secondary | ICD-10-CM | POA: Diagnosis not present

## 2024-04-01 DIAGNOSIS — C3411 Malignant neoplasm of upper lobe, right bronchus or lung: Secondary | ICD-10-CM | POA: Insufficient documentation

## 2024-04-01 DIAGNOSIS — Z5112 Encounter for antineoplastic immunotherapy: Secondary | ICD-10-CM | POA: Diagnosis present

## 2024-04-01 DIAGNOSIS — Z515 Encounter for palliative care: Secondary | ICD-10-CM

## 2024-04-01 DIAGNOSIS — M792 Neuralgia and neuritis, unspecified: Secondary | ICD-10-CM

## 2024-04-01 DIAGNOSIS — C3491 Malignant neoplasm of unspecified part of right bronchus or lung: Secondary | ICD-10-CM

## 2024-04-01 LAB — CBC WITH DIFFERENTIAL (CANCER CENTER ONLY)
Abs Immature Granulocytes: 0.02 10*3/uL (ref 0.00–0.07)
Basophils Absolute: 0 10*3/uL (ref 0.0–0.1)
Basophils Relative: 0 %
Eosinophils Absolute: 0.1 10*3/uL (ref 0.0–0.5)
Eosinophils Relative: 2 %
HCT: 33.6 % — ABNORMAL LOW (ref 39.0–52.0)
Hemoglobin: 11.2 g/dL — ABNORMAL LOW (ref 13.0–17.0)
Immature Granulocytes: 0 %
Lymphocytes Relative: 13 %
Lymphs Abs: 1 10*3/uL (ref 0.7–4.0)
MCH: 32.3 pg (ref 26.0–34.0)
MCHC: 33.3 g/dL (ref 30.0–36.0)
MCV: 96.8 fL (ref 80.0–100.0)
Monocytes Absolute: 0.8 10*3/uL (ref 0.1–1.0)
Monocytes Relative: 10 %
Neutro Abs: 5.8 10*3/uL (ref 1.7–7.7)
Neutrophils Relative %: 75 %
Platelet Count: 261 10*3/uL (ref 150–400)
RBC: 3.47 MIL/uL — ABNORMAL LOW (ref 4.22–5.81)
RDW: 13.7 % (ref 11.5–15.5)
WBC Count: 7.7 10*3/uL (ref 4.0–10.5)
nRBC: 0 % (ref 0.0–0.2)

## 2024-04-01 LAB — CMP (CANCER CENTER ONLY)
ALT: 19 U/L (ref 0–44)
AST: 16 U/L (ref 15–41)
Albumin: 3.5 g/dL (ref 3.5–5.0)
Alkaline Phosphatase: 84 U/L (ref 38–126)
Anion gap: 5 (ref 5–15)
BUN: 12 mg/dL (ref 6–20)
CO2: 28 mmol/L (ref 22–32)
Calcium: 9.2 mg/dL (ref 8.9–10.3)
Chloride: 106 mmol/L (ref 98–111)
Creatinine: 0.67 mg/dL (ref 0.61–1.24)
GFR, Estimated: >60 mL/min (ref >60)
Glucose, Bld: 158 mg/dL — ABNORMAL HIGH (ref 70–99)
Potassium: 3.9 mmol/L (ref 3.5–5.1)
Sodium: 139 mmol/L (ref 135–145)
Total Bilirubin: 0.3 mg/dL (ref 0.0–1.2)
Total Protein: 7.2 g/dL (ref 6.5–8.1)

## 2024-04-01 MED ORDER — OXYCODONE HCL 10 MG PO TABS
10.0000 mg | ORAL_TABLET | Freq: Four times a day (QID) | ORAL | 0 refills | Status: DC | PRN
  Filled 2024-04-01: qty 60, 15d supply, fill #0

## 2024-04-01 MED ORDER — SODIUM CHLORIDE 0.9 % IV SOLN
Freq: Once | INTRAVENOUS | Status: AC
Start: 2024-04-01 — End: 2024-04-01

## 2024-04-01 MED ORDER — SODIUM CHLORIDE 0.9 % IV SOLN
200.0000 mg | Freq: Once | INTRAVENOUS | Status: AC
  Administered 2024-04-01: 200 mg via INTRAVENOUS
  Filled 2024-04-01: qty 200

## 2024-04-01 MED ORDER — SODIUM CHLORIDE 0.9% FLUSH
10.0000 mL | Freq: Once | INTRAVENOUS | Status: AC
  Administered 2024-04-01: 10 mL

## 2024-04-01 MED ORDER — PROCHLORPERAZINE MALEATE 10 MG PO TABS
10.0000 mg | ORAL_TABLET | Freq: Once | ORAL | Status: AC
  Administered 2024-04-01: 10 mg via ORAL
  Filled 2024-04-01: qty 1

## 2024-04-01 MED ORDER — SODIUM CHLORIDE 0.9 % IV SOLN
500.0000 mg/m2 | Freq: Once | INTRAVENOUS | Status: AC
  Administered 2024-04-01: 900 mg via INTRAVENOUS
  Filled 2024-04-01: qty 20

## 2024-04-01 MED ORDER — HEPARIN SOD (PORK) LOCK FLUSH 100 UNIT/ML IV SOLN
500.0000 [IU] | Freq: Once | INTRAVENOUS | Status: AC | PRN
Start: 2024-04-01 — End: 2024-04-01
  Administered 2024-04-01: 500 [IU]

## 2024-04-01 MED ORDER — SODIUM CHLORIDE 0.9% FLUSH
10.0000 mL | INTRAVENOUS | Status: DC | PRN
  Administered 2024-04-01: 10 mL

## 2024-04-01 MED ORDER — OXYCODONE HCL ER 10 MG PO T12A
10.0000 mg | EXTENDED_RELEASE_TABLET | Freq: Two times a day (BID) | ORAL | 0 refills | Status: DC
  Filled 2024-04-08 – 2024-04-20 (×3): qty 60, 30d supply, fill #0

## 2024-04-01 MED ORDER — FOLIC ACID 1 MG PO TABS
1.0000 mg | ORAL_TABLET | Freq: Every day | ORAL | 4 refills | Status: AC
  Filled 2024-04-01: qty 30, 30d supply, fill #0
  Filled 2024-08-25: qty 30, 30d supply, fill #1
  Filled 2024-10-18: qty 30, 30d supply, fill #2

## 2024-04-01 NOTE — Patient Instructions (Signed)
 [facility] - A DEPT OF [facility]  Discharge Instructions: Thank you for choosing [facility] to provide your oncology and hematology care.   If you have a lab appointment with the Cancer Center, please go directly to the Cancer Center and check in at the registration area.   Wear comfortable clothing and clothing appropriate for easy access to any Portacath or PICC line.   We strive to give you quality time with your provider. You may need to reschedule your appointment if you arrive late (15 or more minutes).  Arriving late affects you and other patients whose appointments are after yours.  Also, if you miss three or more appointments without notifying the office, you may be dismissed from the clinic at the provider's discretion.      For prescription refill requests, have your pharmacy contact our office and allow 72 hours for refills to be completed.    Today you received the following chemotherapy and/or immunotherapy agents: Keytruda, Alimta.       To help prevent nausea and vomiting after your treatment, we encourage you to take your nausea medication as directed.  BELOW ARE SYMPTOMS THAT SHOULD BE REPORTED IMMEDIATELY: *FEVER GREATER THAN 100.4 F (38 C) OR HIGHER *CHILLS OR SWEATING *NAUSEA AND VOMITING THAT IS NOT CONTROLLED WITH YOUR NAUSEA MEDICATION *UNUSUAL SHORTNESS OF BREATH *UNUSUAL BRUISING OR BLEEDING *URINARY PROBLEMS (pain or burning when urinating, or frequent urination) *BOWEL PROBLEMS (unusual diarrhea, constipation, pain near the anus) TENDERNESS IN MOUTH AND THROAT WITH OR WITHOUT PRESENCE OF ULCERS (sore throat, sores in mouth, or a toothache) UNUSUAL RASH, SWELLING OR PAIN  UNUSUAL VAGINAL DISCHARGE OR ITCHING   Items with * indicate a potential emergency and should be followed up as soon as possible or go to the Emergency Department if any problems should occur.  Please show the CHEMOTHERAPY ALERT CARD or  IMMUNOTHERAPY ALERT CARD at check-in to the Emergency Department and triage nurse.  Should you have questions after your visit or need to cancel or reschedule your appointment, please contact [facility] - A DEPT OF [facility]  Dept: [phone]  and follow the prompts.  Office hours are 8:00 a.m. to 4:30 p.m. Monday - Friday. Please note that voicemails left after 4:00 p.m. may not be returned until the following business day.  We are closed weekends and major holidays. You have access to a nurse at all times for urgent questions. Please call the main number to the clinic Dept: [phone] and follow the prompts.   For any non-urgent questions, you may also contact your provider using MyChart. We now offer e-Visits for anyone 61 and older to request care online for non-urgent symptoms. For details visit [email or web address].   Also download the MyChart app! Go to the app store, search "MyChart", open the app, select , and log in with your MyChart username and password.

## 2024-04-01 NOTE — Progress Notes (Signed)
 Palliative Medicine [facility]  [phone] [phone]   Name: AREN SAUPE [name] Date: 04/01/2024 MRN: [id number]  DOB: 1966/06/18  Patient Care Team: [name], MD as PCP - General (Family Medicine) Pickenpack-Cousar, Giles Labrum, NP as Nurse Practitioner (Nurse Practitioner)    INTERVAL HISTORY: Samuel [name] is a 58 y.o. 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  Palliative ask to see for symptom management and goals of care.   SOCIAL HISTORY:     reports that he has been smoking cigarettes. He started smoking about 16 months ago. He has a 0.7 pack-year smoking history. He has never used smokeless tobacco. He reports current alcohol  use of about 2.0 standard drinks of alcohol  per week. He reports that he does not use drugs.  ADVANCE DIRECTIVES:  Patient does not have an advanced directives. Education and packet provided. He has expressed interest in completing. States his sister, Sydney Petroni would be his designated Clinical research associate.   CODE STATUS: Full code  PAST MEDICAL HISTORY: Past Medical History:  Diagnosis Date   Alcohol  use disorder, severe, dependence (HCC) 12/26/2016   Anxiety    Depression    Diabetes mellitus without complication (HCC)    Metastatic cancer to brain (HCC) 08/05/2022   Metastatic cancer to spine (HCC) 07/19/2022   Pancreatitis unk   Primary lung adenocarcinoma (HCC) 08/05/2022   Suicidal ideation    Type 2 diabetes mellitus with hyperglycemia, without long-term current use of insulin  (HCC) 06/13/2022   Formatting of this note might be different from the original. 06/13/2022 A1C 13.8, FSBG 414 Start empagliflozin  5mg /metformin  1000 daily, levemir 20 qhs. (Samples given) Will apply for medassist for pharmacy    ALLERGIES:  is allergic to penicillins and percocet  [oxycodone -acetaminophen ].  MEDICATIONS:  Current Outpatient Medications  Medication Sig Dispense Refill   Accu-Chek Softclix Lancets lancets Use to check blood sugar 3 times daily. E11.69 100 each 6   acetaminophen  (TYLENOL ) 500 MG tablet Take 1,000 mg by mouth every 6 (six) hours as needed for moderate pain (pain score 4-6).     atorvastatin  (LIPITOR) 40 MG tablet Take 1 tablet (40 mg total) by mouth daily. 90 tablet 1   buPROPion  (WELLBUTRIN  XL) 150 MG 24 hr tablet Take 1 tablet (150 mg total) by mouth daily. 90 tablet 1   cetirizine  (ZYRTEC ) 10 MG tablet Take 1 tablet (10 mg total) by mouth daily. 30 tablet 1   Continuous Glucose Receiver (FREESTYLE LIBRE 3 READER) DEVI Use to check blood sugar continuously throughout the day. 1 each 0   Continuous Glucose Sensor (FREESTYLE LIBRE 3 PLUS SENSOR) MISC Change sensor every 15 days. Use to check blood sugar continuously. 2 each 6   empagliflozin  (JARDIANCE ) 25 MG TABS tablet Take 1 tablet (25 mg total) by mouth daily before breakfast. 90 tablet 1   fluticasone  (FLONASE ) 50 MCG/ACT nasal spray Place 2 sprays into both nostrils daily. 16 g 1   folic acid  (FOLVITE ) 1 MG tablet Take 1 tablet (1 mg total) by mouth daily. 30 tablet 4   gabapentin  (NEURONTIN ) 300 MG capsule Take 2 capsules (600 mg total) by mouth 3 (three) times daily. 180 capsule 1   glucose blood (ACCU-CHEK GUIDE TEST) test strip Use as instructed twice daily 100 each 12   glucose blood (ACCU-CHEK GUIDE) test strip Use to check blood sugar 3 times  daily. E11.69 100 each 6   insulin  glargine (LANTUS  SOLOSTAR) 100 UNIT/ML Solostar Pen Inject 50 Units into the skin daily. 30 mL 11   Insulin  Pen Needle 32G X 4 MM MISC Use with Insulin  pen 100 each 3   methocarbamol  (ROBAXIN ) 500 MG tablet Take 1 tablet (500 mg total) by mouth every 6 (six) hours as needed for muscle spasms. 90 tablet 0   [START ON 04/08/2024] oxyCODONE  (OXYCONTIN ) 10 mg 12 hr tablet Take 1 tablet (10 mg total) by mouth  every 12 (twelve) hours. 60 tablet 0   Oxycodone  HCl 10 MG TABS Take 1 tablet (10 mg total) by mouth every 6 (six) hours as needed. 60 tablet 0   tadalafil  (CIALIS ) 10 MG tablet Take 1 tablet (10 mg total) by mouth every other day as needed for erectile dysfunction. 10 tablet 2   No current facility-administered medications for this visit.   Facility-Administered Medications Ordered in Other Visits  Medication Dose Route Frequency Provider Last Rate Last Admin   sodium chloride  flush (NS) 0.9 % injection 10 mL  10 mL Intracatheter PRN Marlene Simas, MD   10 mL at 04/01/24 1107   VITAL SIGNS: There were no vitals taken for this visit. There were no vitals filed for this visit.  Estimated body mass index is 23.87 kg/m as calculated from the following:   Height as of an earlier encounter on 04/01/24: 5\' 8"  (1.727 m).   Weight as of an earlier encounter on 04/01/24: 157 lb (71.2 kg).   PERFORMANCE STATUS (ECOG) : 1 - Symptomatic but completely ambulatory  Physical Exam General: NAD Cardiovascular: regular rate and rhythm Pulmonary: normal breathing pattern Extremities: no edema, no joint deformities Skin: no rashes Neurological: AAO x3  IMPRESSION: Discussed the use of AI scribe software for clinical note transcription with the patient, who gave verbal consent to proceed.  History of Present Illness Samuel [name] is a 58 year old male who presents for symptom management follow-up. Patient reports he is doing well overall. Speaks to his appreciation of having recent elk decompression and stabilization surgery mentioning he is no longer having significant numbness or tingling in his right leg.  Also notices an improvement in his gait.  [name] states his appetite is good.  Denies concerns of nausea, vomiting, constipation, or diarrhea.  Occasional fatigue however is able to remain as active as he desires.  We discussed his overall pain at length.  His pain is well-controlled with  OxyContin  every 12 hours and oxycodone  as needed for breakthrough pain.  Does not require breakthrough medication around-the-clock.  Some days is able to go without.  Has been able to decrease gabapentin  600 mg down to twice a day versus 3 times daily.  No adjustments to current regimen at this time.  All questions answered and support provided.    Assessment & Plan Cancer Related Pain Pain is well controlled with oxycodone  10 mg every six hours as needed and OxyContin  every twelve hours. Reports adequate pain control with current regimen.  Is not requiring frequent use of breakthrough medication. - Continue oxycodone  10 mg every six hours as needed - Continue OxyContin  every twelve hours - Continue with gabapentin  600 mg.  Patient has been able to decrease to twice daily.  Follow-up Follow-up appointments scheduled to monitor ongoing management of chronic conditions. - Schedule follow-up appointment in 4-6 weeks. Sooner if needed.   Patient expressed understanding and was in agreement with this plan. He also understands that  He can call the clinic at any time with any questions, concerns, or complaints.   Any controlled substances utilized were prescribed in the context of palliative care. PDMP has been reviewed.   Visit consisted of counseling and education dealing with the complex and emotionally intense issues of symptom management and palliative care in the setting of serious and potentially life-threatening illness.  Dellia Ferguson, AGPCNP-BC  Palliative Medicine Team/[facility]

## 2024-04-01 NOTE — Progress Notes (Signed)
 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  Biomarker Findings Microsatellite status - MS-Stable Tumor Mutational Burden - 8 Muts/Mb Genomic Findings For a complete list of the genes assayed, please refer to the Appendix. KEAP1 Q227* CDKN2A/B [id number] G89V PRKCI amplification TERC amplification - equivocal? TP53 D222fs*18 8 Disease relevant genes with no reportable alterations: ALK, BRAF, EGFR, ERBB2, KRAS, MET, RET, ROS1  PDL1 TPS  90%  PRIOR THERAPY:  1) Status post palliative radiotherapy to the L2 lesion under the care of Dr. Lorri Rota. 2) Status post SRS to solitary brain metastasis under the care of Dr. Lorri Rota. 3) status post L2 laminectomy for decompression of the thecal sac under the care of Dr. Nat Badger on 02/27/2024.  CURRENT THERAPY: Palliative systemic therapy with carboplatin  for AUC of 5, Alimta  500 Mg/M2 and Keytruda  200 Mg IV every 3 weeks.  First dose September 05, 2022.  Status post 27 cycles.  Starting from cycle #5 the patient will be on maintenance treatment with Alimta  and Keytruda  every 3 weeks.  INTERVAL HISTORY: Samuel [name] 58 y.o. male returns to the clinic today for follow-up visit.Discussed the use of AI scribe software for clinical note transcription with the patient, who gave verbal consent to proceed.  History of Present Illness   Samuel [name] is a 58 year old male with stage four lung cancer who presents for follow-up and treatment  cycle.  He is undergoing treatment with Metastatic Lung Cancer protocol, including Alimta  and Keytruda  every three weeks, and is currently preparing for cycle number 28. He has completed 27 cycles so far with no new issues such as chest pain or breathing difficulties.  He underwent back surgery a little over a month ago and reports significant improvement in his condition. He experiences occasional tingling in his leg and notes that his legs tire faster than before, which he attributes to needing to rebuild his strength.  Recent imaging, including a scan of the chest, abdomen, and pelvis, as well as an MRI of the brain, showed stable disease with no new growth or spread. He recalls that his cancer has not grown in over a year and is considered stable.  No recent chest pain or breathing issues.       MEDICAL HISTORY: Past Medical History:  Diagnosis Date   Alcohol  use disorder, severe, dependence (HCC) 12/26/2016   Anxiety    Depression    Diabetes mellitus without complication (HCC)    Metastatic cancer to brain (HCC) 08/05/2022   Metastatic cancer to spine (HCC) 07/19/2022   Pancreatitis unk   Primary lung adenocarcinoma (HCC) 08/05/2022   Suicidal ideation    Type 2 diabetes mellitus with hyperglycemia, without long-term current use of insulin  (HCC) 06/13/2022   Formatting of this note might be different from the original. 06/13/2022 A1C 13.8, FSBG 414 Start empagliflozin  5mg /metformin  1000 daily, levemir 20 qhs. (Samples given) Will apply for medassist for pharmacy    ALLERGIES:  is allergic to penicillins and percocet [oxycodone -acetaminophen ].  MEDICATIONS:  Current Outpatient Medications  Medication Sig Dispense Refill   Accu-Chek Softclix Lancets lancets Use to check blood sugar 3 times daily. E11.69 100 each 6   acetaminophen  (TYLENOL ) 500 MG tablet Take 1,000 mg by mouth every 6 (six) hours as needed for moderate pain (pain score 4-6).     atorvastatin  (LIPITOR) 40 MG  tablet Take 1 tablet (40 mg total) by mouth daily. 90 tablet 1   buPROPion  (WELLBUTRIN  XL) 150 MG 24 hr tablet Take 1 tablet (150 mg total) by mouth daily. 90 tablet 1   cetirizine  (ZYRTEC ) 10 MG tablet Take 1 tablet (10 mg total) by mouth daily. 30 tablet 1   Continuous Glucose Receiver (FREESTYLE LIBRE 3 READER) DEVI Use to check blood sugar continuously throughout the day. 1 each 0   Continuous Glucose Sensor (FREESTYLE LIBRE 3 PLUS SENSOR) MISC Change sensor every 15 days. Use to check blood sugar continuously. 2 each 6   doxycycline  (VIBRA -TABS) 100 MG tablet Take 1 tablet (100 mg total) by mouth 2 (two) times daily. 20 tablet 0   empagliflozin  (JARDIANCE ) 25 MG TABS tablet Take 1 tablet (25 mg total) by mouth daily before breakfast. 90 tablet 1   fluticasone  (FLONASE ) 50 MCG/ACT nasal spray Place 2 sprays into both nostrils daily. 16 g 1   folic acid  (FOLVITE ) 1 MG tablet Take 1 tablet (1 mg total) by mouth daily. 30 tablet 4   gabapentin  (NEURONTIN ) 300 MG capsule Take 2 capsules (600 mg total) by mouth 3 (three) times daily. 180 capsule 1   glucose blood (ACCU-CHEK GUIDE TEST) test strip Use as instructed twice daily 100 each 12   glucose blood (ACCU-CHEK GUIDE) test strip Use to check blood sugar 3 times daily. E11.69 100 each 6   insulin  glargine (LANTUS  SOLOSTAR) 100 UNIT/ML Solostar Pen Inject 50 Units into the skin daily. 30 mL 11   Insulin  Pen Needle 32G X 4 MM MISC Use with Insulin  pen 100 each 3   methocarbamol  (ROBAXIN ) 500 MG tablet Take 1 tablet (500 mg total) by mouth every 6 (six) hours as needed for muscle spasms. 90 tablet 0   oxyCODONE  (OXYCONTIN ) 10 mg 12 hr tablet Take 1 tablet (10 mg total) by mouth every 12 (twelve) hours. 60 tablet 0   Oxycodone  HCl 10 MG TABS Take 1 tablet (10 mg total) by mouth every 6 (six) hours as needed. 60 tablet 0   tadalafil  (CIALIS ) 10 MG tablet Take 1 tablet (10 mg total) by mouth every other day as needed for erectile dysfunction. 10 tablet 2    No current facility-administered medications for this visit.    SURGICAL HISTORY:  Past Surgical History:  Procedure Laterality Date   APPLICATION OF ROBOTIC ASSISTANCE FOR SPINAL PROCEDURE N/A 02/27/2024   Procedure: APPLICATION OF ROBOTIC ASSISTANCE FOR SPINAL PROCEDURE;  Surgeon: Augusto Blonder, MD;  Location: [facility] OR;  Service: Neurosurgery;  Laterality: N/A;   IR BONE TUMOR(S)RF ABLATION  08/01/2022   IR IMAGING GUIDED PORT INSERTION  09/21/2022   IR KYPHO LUMBAR INC FX REDUCE BONE BX UNI/BIL CANNULATION INC/IMAGING  08/01/2022   LAMINECTOMY WITH POSTERIOR LATERAL ARTHRODESIS LEVEL 4 N/A 02/27/2024   Procedure: Lumbar two LAMINECTOMY, Thoracic twelve-Lumbar four INSTRUMENTED STABILIZATION;  Surgeon: Augusto Blonder, MD;  Location: [facility] OR;  Service: Neurosurgery;  Laterality: N/A;    REVIEW OF SYSTEMS:  Constitutional: negative Eyes: negative Ears, nose, mouth, throat, and face: negative Respiratory: negative Cardiovascular: negative Gastrointestinal: negative Genitourinary:negative Integument/breast: negative Hematologic/lymphatic: negative Musculoskeletal:positive for back pain Neurological: positive for paresthesia  Behavioral/Psych: negative Endocrine: negative Allergic/Immunologic: negative   PHYSICAL EXAMINATION: General appearance: alert, cooperative, fatigued, and no distress Head: Normocephalic, without obvious abnormality, atraumatic Neck: no adenopathy, no JVD, supple, symmetrical, trachea midline, and thyroid  not enlarged, symmetric, no tenderness/mass/nodules Lymph nodes: Cervical, supraclavicular, and axillary nodes normal. Resp: clear to auscultation bilaterally Back: symmetric, no curvature. ROM normal. No CVA tenderness. Cardio: regular rate and rhythm, S1, S2 normal, no murmur, click, rub or gallop GI: soft, non-tender; bowel sounds normal; no masses,  no organomegaly Extremities: extremities normal, atraumatic, no cyanosis or edema Neurologic: Alert and  oriented X 3, normal strength and tone. Normal symmetric reflexes. Normal coordination and gait  ECOG PERFORMANCE STATUS: 1 - Symptomatic but completely ambulatory  Blood pressure 118/72, pulse 79, temperature 97.6 F (36.4 C), temperature source Temporal, resp. rate 16, height 5\' 8"  (1.727 m), weight 157 lb (71.2 kg), SpO2 100%.  LABORATORY DATA: Lab Results  Component Value Date   WBC 7.7 04/01/2024   HGB 11.2 (L) 04/01/2024   HCT 33.6 (L) 04/01/2024   MCV 96.8 04/01/2024   PLT 261 04/01/2024      Chemistry      Component Value Date/Time   NA 134 (L) 03/11/2024 0824   NA 140 12/24/2020 1144   NA 142 08/02/2014 1649   K 4.0 03/11/2024 0824   K 4.2 08/02/2014 1649   CL 103 03/11/2024 0824   CL 109 (H) 08/02/2014 1649   CO2 25 03/11/2024 0824   CO2 21 08/02/2014 1649   BUN 19 03/11/2024 0824   BUN 7 12/24/2020 1144   BUN 11 08/02/2014 1649   CREATININE 0.66 03/11/2024 0824   CREATININE 0.92 08/02/2014 1649      Component Value Date/Time   CALCIUM  9.4 03/11/2024 0824   CALCIUM  8.6 08/02/2014 1649   ALKPHOS 85 03/11/2024 0824   ALKPHOS 82 08/02/2014 1649   AST 15 03/11/2024 0824   ALT 25 03/11/2024 0824   ALT 132 (H) 08/02/2014 1649   BILITOT 0.3 03/11/2024 0824       RADIOGRAPHIC STUDIES: MR Brain W Wo Contrast Result Date: 03/27/2024 CLINICAL DATA:  Brain metastasis, assess treatment response. EXAM: MRI HEAD WITHOUT AND WITH CONTRAST TECHNIQUE: Multiplanar, multiecho pulse sequences of the brain and surrounding structures were obtained without and with intravenous contrast. CONTRAST:  7mL GADAVIST  GADOBUTROL  1 MMOL/ML IV SOLN COMPARISON:  12/20/2023 and earlier. FINDINGS: Brain: Redemonstrated punctate focus of intrinsic T1 hyperintensity within the inferior medial right cerebellum (series 600 image 124). No associated enhancement or surrounding edema. Punctate associated susceptibility is similar to prior. No enhancing lesions. No new intracranial lesions noted. No  acute infarct. No evidence of intracranial hemorrhage. White matter is unremarkable. No midline shift. Normal appearance of midline structures. The basilar cisterns are patent. No extra-axial fluid collections. Ventricles: Normal size and configuration of the ventricles. Vascular: Skull base flow voids are visualized. Skull and upper cervical spine: No focal abnormality. Left occipital subgaleal lipoma. Sinuses/Orbits: Mucosal thickening in the right posterior ethmoid air cells. Additional mild mucosal thickening in the bilateral ethmoid and maxillary sinuses. No air-fluid levels. Other: Trace fluid in the bilateral mastoid tips. IMPRESSION: Similar appearance of inferior right cerebellar lesion with punctate intrinsic T1 hyperintensity. No associated enhancement or edema. No new intracranial lesions. Electronically Signed   By: Denny Flack M.D.   On: 03/27/2024 09:38   CT Chest W Contrast Result Date: 03/26/2024 EXAM: CT CHEST WITH CONTRAST 03/26/2024 09:38:20 AM TECHNIQUE: CT of the chest was performed with the administration  of intravenous contrast. Multiplanar reformatted images are provided for review. Automated exposure control, iterative reconstruction, and/or weight based adjustment of the mA/kV was utilized to reduce the radiation dose to as low as reasonably achievable. COMPARISON: 01/01/2024 CT chest, abdomen, and pelvis CLINICAL HISTORY: Non-small cell lung cancer (NSCLC), staging. Lung cancer 3 month follow up FINDINGS: MEDIASTINUM: Right internal jugular port-a-cath terminates at the cavoatrial junction. Left main coronary atherosclerosis. Atherosclerotic nonaneurysmal thoracic aorta. LYMPH NODES: No evidence of mediastinal, hilar or axillary lymphadenopathy. LUNGS AND PLEURA: Mild paraseptal and centrilobular emphysema. No focal consolidation or pulmonary edema. No evidence of pleural effusion or pneumothorax. SOFT TISSUES/BONES: Numerous healed bilateral rib fractures. Stable subtle small faintly  sclerotic posterior T3 vertebral lesion. Bilateral posterior spinal fusion hardware extending inferiorly from the T12 level. No new focal osseous lesions in the chest. UPPER ABDOMEN: Limited images of the upper abdomen demonstrates no acute abnormality. IMPRESSION: 1. Stable subtle sclerotic T3 vertebral metastasis. 2. No new or progressive metastatic disease in the chest. Electronically signed by: Karlyn Overman MD 03/26/2024 11:24 PM EDT RP Workstation: [id number]   CT ABDOMEN PELVIS W CONTRAST Result Date: 03/26/2024 EXAMINATION: CT ABDOMEN PELVIS W CONTRAST CLINICAL INDICATION: Male, 58 years old. Non-small cell lung cancer (NSCLC), staging TECHNIQUE: Axial CT of the abdomen and pelvis with 100 cc Omnipaque  300 intravenous contrast. Multiplanar reformations provided. Unless otherwise specified, incidental thyroid , adrenal, renal lesions do not require dedicated imaging follow up. Additionally, any mentioned pulmonary nodules do not require dedicated imaging follow-up based on the Fleischner guidelines unless otherwise specified. Coronary calcifications are not identified unless otherwise specified. COMPARISON: 01/01/2024 FINDINGS: Regarding findings of the lung bases, please refer to the chest report. The liver appears normal. The gallbladder is normal. The spleen is normal. The pancreas is normal. The left adrenal is normal. Stable nonspecific similar right adrenal gland thickening. The kidneys are normal. Abdominal aorta is normal in caliber. Scattered atherosclerotic changes are present. The urinary bladder is normal. Large and small bowel loops are otherwise within normal limits. No free fluid or pathologic adenopathy by size criteria. Old L2 pathologic T2 burst fracture again noted status post vertebral augmentation with interval fixation from T12 through L4. L3 hemangioma again noted. IMPRESSION: Redemonstration of an old pathologic L2 compression fracture status post vertebral augmentation and interval  spinal fusion. Similar nonspecific right adrenal gland thickening. DOSE REDUCTION: This exam was performed according to our departmental dose-optimization program which includes automated exposure control, adjustment of the mA and/or kV according to patient size and/or use of iterative reconstruction technique. 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 He has molecular studies by foundation 1 that showed no actionable mutations and PD-L1 expression was 90%. He underwent palliative radiotherapy to the L2 vertebral body compression fraction as well as solitary brain metastasis with SRS. He is currently undergoing palliative systemic chemotherapy with carboplatin  for AUC of 5, Alimta  500 Mg/M2 and Keytruda  200 Mg IV every 3 weeks.  Status post 27 cycles. Starting from cycle #5 he is on treatment with maintenance Alimta  and  Keytruda  every 3 weeks.  He has been tolerating this treatment well with no concerning adverse effects. He had repeat CT scan of the chest, abdomen pelvis as well as MRI of the head that showed no concerning findings for disease progression.    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 ) Status post palliative radiotherapy to the L2 lesion under the care of Dr. Lorri Rota. 2) Status post SRS to solitary brain metastasis under the care of Dr. Lorri Rota. 3) status post L2 laminectomy for decompression of the thecal sac under the care of Dr. Nat Badger on 02/27/2024.  - He is currently undergoing Palliative systemic therapy with carboplatin  for AUC of 5, Alimta  500 Mg/M2 and Keytruda  200 Mg IV every 3 weeks.  First dose September 05, 2022.  Status post 27 cycles.  Starting from cycle #5 the patient will be on maintenance treatment with Alimta  and Keytruda  every 3 weeks.  Stage 4 lung cancer, well-managed with no progression in recent imaging of the chest, abdomen, pelvis, and brain. The cancer remains incurable but treatable, with a survival duration of nearly two years. It is stable and contained, with no expectation of remission. - Continue current treatment regimen with Alimta  and Keytruda  every three weeks. - Schedule next appointment in three weeks for the next treatment cycle.    For the pain management, he is currently on MS Contin  15 mg p.o. twice daily in addition to Oxy IR for breakthrough pain and managed by the palliative care team.  The patient was advised to call immediately if she has any other concerning symptoms in the interval.  The patient voices understanding of current disease status and treatment options and is in agreement with the current care plan.  All questions were answered. The patient knows to call the clinic with any problems, questions or concerns. We can certainly see the patient much sooner if necessary.  The total time spent in the appointment was 30 minutes.  Disclaimer: This note was dictated with voice recognition software. Similar sounding words can inadvertently be transcribed and may not be corrected upon review.

## 2024-04-02 ENCOUNTER — Other Ambulatory Visit: Payer: Self-pay | Admitting: Radiation Therapy

## 2024-04-02 DIAGNOSIS — C7931 Secondary malignant neoplasm of brain: Secondary | ICD-10-CM

## 2024-04-05 ENCOUNTER — Telehealth: Payer: Self-pay | Admitting: Radiation Therapy

## 2024-04-05 NOTE — Telephone Encounter (Signed)
 I called and spoke with Samuel [name] about his upcoming brain MRI appointment in August. He has the information for reference in MyChart and my contact information to call back with any future questions or potential conflicts.   Axel Bohr R.T(R)(T) Radiation Special Procedures Lead

## 2024-04-08 ENCOUNTER — Other Ambulatory Visit (HOSPITAL_COMMUNITY): Payer: Self-pay

## 2024-04-08 ENCOUNTER — Other Ambulatory Visit: Payer: Self-pay

## 2024-04-08 ENCOUNTER — Encounter: Payer: Self-pay | Admitting: Internal Medicine

## 2024-04-17 ENCOUNTER — Ambulatory Visit: Payer: Self-pay | Admitting: Family Medicine

## 2024-04-17 ENCOUNTER — Other Ambulatory Visit (HOSPITAL_COMMUNITY): Payer: Self-pay

## 2024-04-18 ENCOUNTER — Ambulatory Visit: Admitting: Pharmacist

## 2024-04-18 NOTE — Progress Notes (Signed)
 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    Biomarker Findings Microsatellite status - MS-Stable Tumor Mutational Burden - 8 Muts/Mb Genomic Findings For a complete list of the genes assayed, please refer to the Appendix. KEAP1 Q227* CDKN2A/B [id number] G89V PRKCI amplification TERC amplification - equivocal? TP53 [id number] 8 Disease relevant genes with no reportable alterations: ALK, BRAF, EGFR, ERBB2, KRAS, MET, RET, ROS1   PDL1 TPS  90%  PRIOR THERAPY: 1) Status post palliative radiotherapy to the L2 lesion under the care of Dr. Lorri [name]. 2) Status post SRS to solitary brain metastasis under the care of Dr. Lorri [name] 3) status post L2 laminectomy for decompression of the thecal sac under the care of Dr. Nat [name] on 02/27/2024.   CURRENT THERAPY: Palliative systemic therapy with carboplatin  for AUC of 5, Alimta  500 Mg/M2 and Keytruda  200 Mg IV every 3 weeks. First dose September 05, 2022. Status post 28 cycles. Starting from cycle #5, he started on maintenance keytruda  and alimta  IV every 3 weeks.   INTERVAL HISTORY: Samuel [name] 58 y.o. male returns to the clinic today for a follow-up visit. He states he feels well today. He tolerated his last appointment/infusion well except he does get fatigued but he does try to stay active.   He underwent orthopedic surgery on 02/27/24. He is glad he had surgery as this has helped him. He still wears a back brace.   He denies fevers.  He has a good appetite. He ate late last night and it  caused some indigestion. He was previously using Pepto bismol. He saw palliative care today who prescribed pepcid. Denies nausea or vomiting. He denies diarrhea or constipation. Denies any shortness of breath. He denies cough. He denies hemoptysis. Denies any headache or visual changes. He denies any new rashes but sometimes may get a scattered skin lesion on his trunk or arm here and there. He sometimes may He is here for evaluation and before undergoing cycle #29   MEDICAL HISTORY: Past Medical History:  Diagnosis Date   Alcohol  use disorder, severe, dependence (HCC) 12/26/2016   Anxiety    Depression    Diabetes mellitus without complication (HCC)    Metastatic cancer to brain (HCC) 08/05/2022   Metastatic cancer to spine (HCC) 07/19/2022   Pancreatitis unk   Primary lung adenocarcinoma (HCC) 08/05/2022   Suicidal ideation    Type 2 diabetes mellitus with hyperglycemia, without long-term current use of insulin  (HCC) 06/13/2022   Formatting of this note might be different from the original. 06/13/2022 A1C 13.8, FSBG 414 Start empagliflozin  5mg /metformin  1000 daily, levemir 20 qhs. (Samples given) Will apply for medassist for pharmacy    ALLERGIES:  is allergic to penicillins and percocet [oxycodone -acetaminophen ].  MEDICATIONS:  Current Outpatient Medications  Medication Sig Dispense Refill   Accu-Chek Softclix Lancets lancets Use to check blood sugar 3 times daily. E11.69 100 each 6   acetaminophen  (TYLENOL ) 500 MG tablet Take 1,000 mg by mouth every 6 (six) hours as needed for moderate pain (pain score 4-6).     atorvastatin  (LIPITOR) 40 MG tablet Take 1 tablet (40  mg total) by mouth daily. 90 tablet 1   buPROPion  (WELLBUTRIN  XL) 150 MG 24 hr tablet Take 1 tablet (150 mg total) by mouth daily. 90 tablet 1   cetirizine  (ZYRTEC ) 10 MG tablet Take 1 tablet (10 mg total) by mouth daily. 30 tablet 1   Continuous Glucose Receiver (FREESTYLE LIBRE 3 READER) DEVI Use to check blood sugar  continuously throughout the day. 1 each 0   Continuous Glucose Sensor (FREESTYLE LIBRE 3 PLUS SENSOR) MISC Change sensor every 15 days. Use to check blood sugar continuously. 2 each 6   empagliflozin  (JARDIANCE ) 25 MG TABS tablet Take 1 tablet (25 mg total) by mouth daily before breakfast. 90 tablet 1   famotidine (PEPCID) 40 MG tablet Take 1 tablet (40 mg total) by mouth daily. 30 tablet 3   fluticasone  (FLONASE ) 50 MCG/ACT nasal spray Place 2 sprays into both nostrils daily. 16 g 1   folic acid  (FOLVITE ) 1 MG tablet Take 1 tablet (1 mg total) by mouth daily. 30 tablet 4   gabapentin  (NEURONTIN ) 300 MG capsule Take 2 capsules (600 mg total) by mouth 3 (three) times daily. 180 capsule 1   glucose blood (ACCU-CHEK GUIDE TEST) test strip Use as instructed twice daily 100 each 12   glucose blood (ACCU-CHEK GUIDE) test strip Use to check blood sugar 3 times daily. E11.69 100 each 6   insulin  glargine (LANTUS  SOLOSTAR) 100 UNIT/ML Solostar Pen Inject 50 Units into the skin daily. 30 mL 11   Insulin  Pen Needle 32G X 4 MM MISC Use with Insulin  pen 100 each 3   methocarbamol  (ROBAXIN ) 500 MG tablet Take 1 tablet (500 mg total) by mouth every 6 (six) hours as needed for muscle spasms. 90 tablet 0   oxyCODONE  (OXYCONTIN ) 10 mg 12 hr tablet Take 1 tablet (10 mg total) by mouth every 12 (twelve) hours. 60 tablet 0   Oxycodone  HCl 10 MG TABS Take 1 tablet (10 mg total) by mouth every 6 (six) hours as needed. 60 tablet 0   tadalafil  (CIALIS ) 10 MG tablet Take 1 tablet (10 mg total) by mouth every other day as needed for erectile dysfunction. 10 tablet 2   No current facility-administered medications for this visit.    SURGICAL HISTORY:  Past Surgical History:  Procedure Laterality Date   APPLICATION OF ROBOTIC ASSISTANCE FOR SPINAL PROCEDURE N/A 02/27/2024   Procedure: APPLICATION OF ROBOTIC ASSISTANCE FOR SPINAL PROCEDURE;  Surgeon: Samuel Blonder, MD;  Location: [facility] OR;  Service: Neurosurgery;   Laterality: N/A;   IR BONE TUMOR(S)RF ABLATION  08/01/2022   IR IMAGING GUIDED PORT INSERTION  09/21/2022   IR KYPHO LUMBAR INC FX REDUCE BONE BX UNI/BIL CANNULATION INC/IMAGING  08/01/2022   LAMINECTOMY WITH POSTERIOR LATERAL ARTHRODESIS LEVEL 4 N/A 02/27/2024   Procedure: Lumbar two LAMINECTOMY, Thoracic twelve-Lumbar four INSTRUMENTED STABILIZATION;  Surgeon: Samuel Blonder, MD;  Location: [facility] OR;  Service: Neurosurgery;  Laterality: N/A;    REVIEW OF SYSTEMS:   Constitutional: Positive for stable fatigue. Negative for appetite change, chills,  fever and unexpected weight change.  HENT:  Negative for mouth sores, nosebleeds, sore throat and trouble swallowing.   Eyes: Negative for eye problems and icterus.  Respiratory: Negative for cough, hemoptysis, shortness of breath and wheezing.   Cardiovascular: Negative for chest pain and leg swelling.  Gastrointestinal: Negative for abdominal pain, constipation, diarrhea, nausea and vomiting.  Genitourinary: Negative for bladder incontinence, difficulty urinating, dysuria, frequency and hematuria.   Musculoskeletal:  Positive for chronic back pain.  Negative for gait problem, neck pain and neck stiffness.  Skin: Negative for itching and rash.  Neurological: Negative for dizziness, extremity weakness, gait problem, headaches, light-headedness and seizures.  Hematological: Negative for adenopathy. Does not bruise/bleed easily.  Psychiatric/Behavioral: Negative for confusion, depression and sleep disturbance. The patient is not nervous/anxious.    PHYSICAL EXAMINATION:  Blood pressure 117/76, pulse 85, temperature 97.6 F (36.4 C), temperature source Temporal, resp. rate 17, height 5\' 8"  (1.727 m), weight 154 lb 6.4 oz (70 kg), SpO2 98%.  ECOG PERFORMANCE STATUS: 1  Physical Exam  Constitutional: Oriented to person, place, and time and well-developed, well-nourished, and in no distress.  HENT:  Head: Normocephalic and atraumatic.  Mouth/Throat:  Oropharynx is clear and moist. No oropharyngeal exudate.  Eyes: Conjunctivae are normal. Right eye exhibits no discharge. Left eye exhibits no discharge. No scleral icterus.  Neck: Normal range of motion. Neck supple.  Cardiovascular: Normal rate, regular rhythm, normal heart sounds and intact distal pulses.   Pulmonary/Chest: Effort normal and breath sounds normal. No respiratory distress. No wheezes. No rales.  Abdominal: Soft. Bowel sounds are normal. Exhibits no distension and no mass. There is no tenderness.  Musculoskeletal: Normal range of motion. Exhibits no edema.  Lymphadenopathy:    No cervical adenopathy.  Neurological: Alert and oriented to person, place, and time. Exhibits normal muscle tone. Gait normal. Coordination normal.  Skin: Skin is warm and dry. No rash noted. Not diaphoretic. No erythema. No pallor.  Psychiatric: Mood, memory and judgment normal.  Vitals reviewed.  LABORATORY DATA: Lab Results  Component Value Date   WBC 7.3 04/24/2024   HGB 12.8 (L) 04/24/2024   HCT 38.7 (L) 04/24/2024   MCV 95.8 04/24/2024   PLT 263 04/24/2024      Chemistry      Component Value Date/Time   NA 134 (L) 04/24/2024 1042   NA 140 12/24/2020 1144   NA 142 08/02/2014 1649   K 4.1 04/24/2024 1042   K 4.2 08/02/2014 1649   CL 100 04/24/2024 1042   CL 109 (H) 08/02/2014 1649   CO2 29 04/24/2024 1042   CO2 21 08/02/2014 1649   BUN 16 04/24/2024 1042   BUN 7 12/24/2020 1144   BUN 11 08/02/2014 1649   CREATININE 0.66 04/24/2024 1042   CREATININE 0.92 08/02/2014 1649      Component Value Date/Time   CALCIUM  10.1 04/24/2024 1042   CALCIUM  8.6 08/02/2014 1649   ALKPHOS 91 04/24/2024 1042   ALKPHOS 82 08/02/2014 1649   AST 22 04/24/2024 1042   ALT 32 04/24/2024 1042   ALT 132 (H) 08/02/2014 1649   BILITOT 0.4 04/24/2024 1042       RADIOGRAPHIC STUDIES:  CT Chest W Contrast Result Date: 03/26/2024 EXAM: CT CHEST WITH CONTRAST 03/26/2024 09:38:20 AM TECHNIQUE: CT of  the chest was performed with the administration of intravenous contrast. Multiplanar reformatted images are provided for review. Automated exposure control, iterative reconstruction, and/or weight based adjustment of the mA/kV was utilized to reduce the radiation dose to as low as reasonably achievable. COMPARISON: 01/01/2024 CT chest, abdomen, and pelvis CLINICAL HISTORY: Non-small cell lung cancer (NSCLC), staging. Lung cancer 3 month follow up FINDINGS: MEDIASTINUM: Right internal jugular port-a-cath terminates at the cavoatrial junction. Left main coronary atherosclerosis. Atherosclerotic nonaneurysmal thoracic aorta. LYMPH NODES: No evidence of mediastinal, hilar or axillary lymphadenopathy. LUNGS AND PLEURA: Mild paraseptal and centrilobular emphysema. No focal consolidation or pulmonary edema. No evidence of pleural effusion or pneumothorax. SOFT TISSUES/BONES: Numerous healed  bilateral rib fractures. Stable subtle small faintly sclerotic posterior T3 vertebral lesion. Bilateral posterior spinal fusion hardware extending inferiorly from the T12 level. No new focal osseous lesions in the chest. UPPER ABDOMEN: Limited images of the upper abdomen demonstrates no acute abnormality. IMPRESSION: 1. Stable subtle sclerotic T3 vertebral metastasis. 2. No new or progressive metastatic disease in the chest. Electronically signed by: Karlyn Overman MD 03/26/2024 11:24 PM EDT RP Workstation: [id number]   CT ABDOMEN PELVIS W CONTRAST Result Date: 03/26/2024 EXAMINATION: CT ABDOMEN PELVIS W CONTRAST CLINICAL INDICATION: Male, 58 years old. Non-small cell lung cancer (NSCLC), staging TECHNIQUE: Axial CT of the abdomen and pelvis with 100 cc Omnipaque  300 intravenous contrast. Multiplanar reformations provided. Unless otherwise specified, incidental thyroid , adrenal, renal lesions do not require dedicated imaging follow up. Additionally, any mentioned pulmonary nodules do not require dedicated imaging follow-up based on the  Fleischner guidelines unless otherwise specified. Coronary calcifications are not identified unless otherwise specified. COMPARISON: 01/01/2024 FINDINGS: Regarding findings of the lung bases, please refer to the chest report. The liver appears normal. The gallbladder is normal. The spleen is normal. The pancreas is normal. The left adrenal is normal. Stable nonspecific similar right adrenal gland thickening. The kidneys are normal. Abdominal aorta is normal in caliber. Scattered atherosclerotic changes are present. The urinary bladder is normal. Large and small bowel loops are otherwise within normal limits. No free fluid or pathologic adenopathy by size criteria. Old L2 pathologic T2 burst fracture again noted status post vertebral augmentation with interval fixation from T12 through L4. L3 hemangioma again noted. IMPRESSION: Redemonstration of an old pathologic L2 compression fracture status post vertebral augmentation and interval spinal fusion. Similar nonspecific right adrenal gland thickening. DOSE REDUCTION: This exam was performed according to our departmental dose-optimization program which includes automated exposure control, adjustment of the mA and/or kV according to patient size and/or use of iterative reconstruction technique. Electronically signed by: Italy Engel MD 03/26/2024 01:14 PM EDT RP Workstation: [id number]     ASSESSMENT/PLAN:  This is a very pleasant 58 year old African-American male diagnosed with stage IV (T3, N2, M1 C) non-small cell lung cancer, adenocarcinoma.  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  He underwent biopsy and kyphoplasty to the L2 and to the brain metastasis.  He was diagnosed in September 2023.   His molecular studies showed no actionable mutations and his PD-L1 expression is 90%.  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    He underwent palliative radiation to L2 compression fracture as well as a solitary brain metastasis with SRS.    He is currently being treated with palliative systemic chemotherapy with carboplatin  for an AUC of 5, Alimta  500 mg per metered square, Keytruda  200 mg IV every 3 weeks.  He status post 28 cycles and he tolerated well without any concerning adverse side effects.  Starting from cycle #5 he started maintenance Alimta  and Keytruda .  IV every 3 weeks   Radiation oncology is monitoring his brain MRI and lumbar spine closely every few months. His next scan is due on 06/25/24   Labs were reviewed.  Recommend that he proceed with cycle #29 today as scheduled.   We will see him back for follow-up visit in 3 weeks for evaluation repeat blood work  before undergoing cycle #30.   He will take pepcid for indigestion as prescribed by palliative care. We also discussed life style modifications to help with indigestion such as not eating too late and sitting upright for a few hours after eating.    The patient was advised to call immediately if he has any concerning symptoms in the interval. The patient voices understanding of current disease status and treatment options and is in agreement with the current care plan. All questions were answered. The patient knows to call the clinic with any problems, questions or concerns. We can certainly see the patient much sooner if necessary   No orders of the defined types were placed in this encounter.    The total time spent in the appointment was 20-29 minutes  Meghin Thivierge L Arlander Gillen, PA-C 04/24/24

## 2024-04-19 ENCOUNTER — Other Ambulatory Visit (HOSPITAL_COMMUNITY): Payer: Self-pay

## 2024-04-19 ENCOUNTER — Encounter: Payer: Self-pay | Admitting: Internal Medicine

## 2024-04-20 ENCOUNTER — Other Ambulatory Visit (HOSPITAL_COMMUNITY): Payer: Self-pay

## 2024-04-20 ENCOUNTER — Encounter: Payer: Self-pay | Admitting: Internal Medicine

## 2024-04-22 ENCOUNTER — Ambulatory Visit

## 2024-04-22 ENCOUNTER — Other Ambulatory Visit

## 2024-04-22 ENCOUNTER — Other Ambulatory Visit: Payer: Self-pay

## 2024-04-22 ENCOUNTER — Ambulatory Visit: Admitting: Internal Medicine

## 2024-04-22 ENCOUNTER — Encounter

## 2024-04-23 ENCOUNTER — Other Ambulatory Visit: Payer: Self-pay | Admitting: Nurse Practitioner

## 2024-04-23 ENCOUNTER — Other Ambulatory Visit: Payer: Self-pay

## 2024-04-23 ENCOUNTER — Other Ambulatory Visit (HOSPITAL_COMMUNITY): Payer: Self-pay

## 2024-04-23 ENCOUNTER — Ambulatory Visit (INDEPENDENT_AMBULATORY_CARE_PROVIDER_SITE_OTHER): Payer: Self-pay | Admitting: Licensed Clinical Social Worker

## 2024-04-23 ENCOUNTER — Encounter (HOSPITAL_COMMUNITY): Payer: Self-pay | Admitting: Licensed Clinical Social Worker

## 2024-04-23 DIAGNOSIS — F322 Major depressive disorder, single episode, severe without psychotic features: Secondary | ICD-10-CM | POA: Diagnosis not present

## 2024-04-23 DIAGNOSIS — F4310 Post-traumatic stress disorder, unspecified: Secondary | ICD-10-CM

## 2024-04-23 DIAGNOSIS — F411 Generalized anxiety disorder: Secondary | ICD-10-CM | POA: Diagnosis not present

## 2024-04-23 MED ORDER — OXYCODONE HCL 10 MG PO TABS
10.0000 mg | ORAL_TABLET | Freq: Four times a day (QID) | ORAL | 0 refills | Status: DC | PRN
  Filled 2024-04-23: qty 60, 15d supply, fill #0
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
## 2024-04-24 ENCOUNTER — Inpatient Hospital Stay

## 2024-04-24 ENCOUNTER — Inpatient Hospital Stay (HOSPITAL_BASED_OUTPATIENT_CLINIC_OR_DEPARTMENT_OTHER): Admitting: Nurse Practitioner

## 2024-04-24 ENCOUNTER — Inpatient Hospital Stay (HOSPITAL_BASED_OUTPATIENT_CLINIC_OR_DEPARTMENT_OTHER): Admitting: Physician Assistant

## 2024-04-24 ENCOUNTER — Inpatient Hospital Stay: Attending: Adult Health

## 2024-04-24 ENCOUNTER — Encounter: Payer: Self-pay | Admitting: Nurse Practitioner

## 2024-04-24 ENCOUNTER — Other Ambulatory Visit (HOSPITAL_COMMUNITY): Payer: Self-pay

## 2024-04-24 VITALS — BP 117/76 | HR 85 | Temp 97.6°F | Resp 17 | Ht 68.0 in | Wt 154.4 lb

## 2024-04-24 DIAGNOSIS — Z5111 Encounter for antineoplastic chemotherapy: Secondary | ICD-10-CM | POA: Diagnosis not present

## 2024-04-24 DIAGNOSIS — C3490 Malignant neoplasm of unspecified part of unspecified bronchus or lung: Secondary | ICD-10-CM

## 2024-04-24 DIAGNOSIS — Z5112 Encounter for antineoplastic immunotherapy: Secondary | ICD-10-CM

## 2024-04-24 DIAGNOSIS — J90 Pleural effusion, not elsewhere classified: Secondary | ICD-10-CM | POA: Insufficient documentation

## 2024-04-24 DIAGNOSIS — C7931 Secondary malignant neoplasm of brain: Secondary | ICD-10-CM | POA: Insufficient documentation

## 2024-04-24 DIAGNOSIS — R5383 Other fatigue: Secondary | ICD-10-CM | POA: Diagnosis not present

## 2024-04-24 DIAGNOSIS — Z515 Encounter for palliative care: Secondary | ICD-10-CM | POA: Diagnosis not present

## 2024-04-24 DIAGNOSIS — K5903 Drug induced constipation: Secondary | ICD-10-CM

## 2024-04-24 DIAGNOSIS — G893 Neoplasm related pain (acute) (chronic): Secondary | ICD-10-CM

## 2024-04-24 DIAGNOSIS — K219 Gastro-esophageal reflux disease without esophagitis: Secondary | ICD-10-CM

## 2024-04-24 DIAGNOSIS — Z79899 Other long term (current) drug therapy: Secondary | ICD-10-CM | POA: Diagnosis not present

## 2024-04-24 DIAGNOSIS — E119 Type 2 diabetes mellitus without complications: Secondary | ICD-10-CM | POA: Diagnosis not present

## 2024-04-24 DIAGNOSIS — R590 Localized enlarged lymph nodes: Secondary | ICD-10-CM | POA: Diagnosis not present

## 2024-04-24 DIAGNOSIS — K30 Functional dyspepsia: Secondary | ICD-10-CM | POA: Insufficient documentation

## 2024-04-24 DIAGNOSIS — C3411 Malignant neoplasm of upper lobe, right bronchus or lung: Secondary | ICD-10-CM | POA: Diagnosis not present

## 2024-04-24 DIAGNOSIS — C7951 Secondary malignant neoplasm of bone: Secondary | ICD-10-CM | POA: Insufficient documentation

## 2024-04-24 DIAGNOSIS — Z7984 Long term (current) use of oral hypoglycemic drugs: Secondary | ICD-10-CM | POA: Diagnosis not present

## 2024-04-24 LAB — CBC WITH DIFFERENTIAL (CANCER CENTER ONLY)
Abs Immature Granulocytes: 0.02 10*3/uL (ref 0.00–0.07)
Basophils Absolute: 0 10*3/uL (ref 0.0–0.1)
Basophils Relative: 1 %
Eosinophils Absolute: 0.1 10*3/uL (ref 0.0–0.5)
Eosinophils Relative: 1 %
HCT: 38.7 % — ABNORMAL LOW (ref 39.0–52.0)
Hemoglobin: 12.8 g/dL — ABNORMAL LOW (ref 13.0–17.0)
Immature Granulocytes: 0 %
Lymphocytes Relative: 19 %
Lymphs Abs: 1.4 10*3/uL (ref 0.7–4.0)
MCH: 31.7 pg (ref 26.0–34.0)
MCHC: 33.1 g/dL (ref 30.0–36.0)
MCV: 95.8 fL (ref 80.0–100.0)
Monocytes Absolute: 0.7 10*3/uL (ref 0.1–1.0)
Monocytes Relative: 9 %
Neutro Abs: 5.1 10*3/uL (ref 1.7–7.7)
Neutrophils Relative %: 70 %
Platelet Count: 263 10*3/uL (ref 150–400)
RBC: 4.04 MIL/uL — ABNORMAL LOW (ref 4.22–5.81)
RDW: 13.4 % (ref 11.5–15.5)
WBC Count: 7.3 10*3/uL (ref 4.0–10.5)
nRBC: 0 % (ref 0.0–0.2)

## 2024-04-24 LAB — CMP (CANCER CENTER ONLY)
ALT: 32 U/L (ref 0–44)
AST: 22 U/L (ref 15–41)
Albumin: 4 g/dL (ref 3.5–5.0)
Alkaline Phosphatase: 91 U/L (ref 38–126)
Anion gap: 5 (ref 5–15)
BUN: 16 mg/dL (ref 6–20)
CO2: 29 mmol/L (ref 22–32)
Calcium: 10.1 mg/dL (ref 8.9–10.3)
Chloride: 100 mmol/L (ref 98–111)
Creatinine: 0.66 mg/dL (ref 0.61–1.24)
GFR, Estimated: >60 mL/min (ref >60)
Glucose, Bld: 298 mg/dL — ABNORMAL HIGH (ref 70–99)
Potassium: 4.1 mmol/L (ref 3.5–5.1)
Sodium: 134 mmol/L — ABNORMAL LOW (ref 135–145)
Total Bilirubin: 0.4 mg/dL (ref 0.0–1.2)
Total Protein: 8.1 g/dL (ref 6.5–8.1)

## 2024-04-24 MED ORDER — SODIUM CHLORIDE 0.9 % IV SOLN: Freq: Once | INTRAVENOUS | Status: AC

## 2024-04-24 MED ORDER — FAMOTIDINE 40 MG PO TABS
40.0000 mg | ORAL_TABLET | Freq: Every day | ORAL | 3 refills | Status: DC
  Filled 2024-04-24: qty 30, 30d supply, fill #0
  Filled 2024-07-10: qty 30, 30d supply, fill #1

## 2024-04-24 MED ORDER — SODIUM CHLORIDE 0.9% FLUSH
10.0000 mL | INTRAVENOUS | Status: DC | PRN
  Administered 2024-04-24: 10 mL

## 2024-04-24 MED ORDER — PROCHLORPERAZINE MALEATE 10 MG PO TABS
10.0000 mg | ORAL_TABLET | Freq: Once | ORAL | Status: AC
  Administered 2024-04-24: 10 mg via ORAL
  Filled 2024-04-24: qty 1

## 2024-04-24 MED ORDER — SODIUM CHLORIDE 0.9% FLUSH
10.0000 mL | Freq: Once | INTRAVENOUS | Status: AC
  Administered 2024-04-24: 10 mL

## 2024-04-24 MED ORDER — SODIUM CHLORIDE 0.9 % IV SOLN
200.0000 mg | Freq: Once | INTRAVENOUS | Status: AC
  Administered 2024-04-24: 200 mg via INTRAVENOUS
  Filled 2024-04-24: qty 200

## 2024-04-24 MED ORDER — HEPARIN SOD (PORK) LOCK FLUSH 100 UNIT/ML IV SOLN
500.0000 [IU] | Freq: Once | INTRAVENOUS | Status: AC | PRN
  Administered 2024-04-24: 500 [IU]

## 2024-04-24 MED ORDER — SODIUM CHLORIDE 0.9 % IV SOLN
500.0000 mg/m2 | Freq: Once | INTRAVENOUS | Status: AC
  Administered 2024-04-24: 900 mg via INTRAVENOUS
  Filled 2024-04-24: qty 20

## 2024-04-24 NOTE — Patient Instructions (Signed)
 [facility] - A DEPT OF [facility]  Discharge Instructions: Thank you for choosing [facility] to provide your oncology and hematology care.   If you have a lab appointment with the Cancer Center, please go directly to the Cancer Center and check in at the registration area.   Wear comfortable clothing and clothing appropriate for easy access to any Portacath or PICC line.   We strive to give you quality time with your provider. You may need to reschedule your appointment if you arrive late (15 or more minutes).  Arriving late affects you and other patients whose appointments are after yours.  Also, if you miss three or more appointments without notifying the office, you may be dismissed from the clinic at the provider's discretion.      For prescription refill requests, have your pharmacy contact our office and allow 72 hours for refills to be completed.    Today you received the following chemotherapy and/or immunotherapy agents: Keytruda/Alimta      To help prevent nausea and vomiting after your treatment, we encourage you to take your nausea medication as directed.  BELOW ARE SYMPTOMS THAT SHOULD BE REPORTED IMMEDIATELY: *FEVER GREATER THAN 100.4 F (38 C) OR HIGHER *CHILLS OR SWEATING *NAUSEA AND VOMITING THAT IS NOT CONTROLLED WITH YOUR NAUSEA MEDICATION *UNUSUAL SHORTNESS OF BREATH *UNUSUAL BRUISING OR BLEEDING *URINARY PROBLEMS (pain or burning when urinating, or frequent urination) *BOWEL PROBLEMS (unusual diarrhea, constipation, pain near the anus) TENDERNESS IN MOUTH AND THROAT WITH OR WITHOUT PRESENCE OF ULCERS (sore throat, sores in mouth, or a toothache) UNUSUAL RASH, SWELLING OR PAIN  UNUSUAL VAGINAL DISCHARGE OR ITCHING   Items with * indicate a potential emergency and should be followed up as soon as possible or go to the Emergency Department if any problems should occur.  Please show the CHEMOTHERAPY ALERT CARD or  IMMUNOTHERAPY ALERT CARD at check-in to the Emergency Department and triage nurse.  Should you have questions after your visit or need to cancel or reschedule your appointment, please contact [facility] - A DEPT OF [facility]  Dept: [phone]  and follow the prompts.  Office hours are 8:00 a.m. to 4:30 p.m. Monday - Friday. Please note that voicemails left after 4:00 p.m. may not be returned until the following business day.  We are closed weekends and major holidays. You have access to a nurse at all times for urgent questions. Please call the main number to the clinic Dept: [phone] and follow the prompts.   For any non-urgent questions, you may also contact your provider using MyChart. We now offer e-Visits for anyone 6 and older to request care online for non-urgent symptoms. For details visit [email or web address].   Also download the MyChart app! Go to the app store, search "MyChart", open the app, select [place], and log in with your MyChart username and password.

## 2024-04-24 NOTE — Progress Notes (Signed)
 Palliative Medicine [facility]  Telephone:([phone]) [phone] Fax:(336) [phone]   Name: Samuel [name] Date: 04/24/2024 MRN: [id number]  DOB: [birth year]  Patient Care Team: [name], MD as PCP - General (Family Medicine) Pickenpack-Cousar, Giles Labrum, NP as Nurse Practitioner (Nurse Practitioner)    INTERVAL HISTORY: Samuel [name] is a 58 y.o. 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  Palliative ask to see for symptom management and goals of care.   SOCIAL HISTORY:     reports that he has been smoking cigarettes. He started smoking about 17 months ago. He has a 0.7 pack-year smoking history. He has never used smokeless tobacco. He reports current alcohol  use of about 2.0 standard drinks of alcohol  per week. He reports that he does not use drugs.  ADVANCE DIRECTIVES:  Patient does not have an advanced directives. Education and packet provided. He has expressed interest in completing. States his sister, Samuel [name] would be his designated Clinical research associate.   CODE STATUS: Full code  PAST MEDICAL HISTORY: Past Medical History:  Diagnosis Date   Alcohol  use disorder, severe, dependence (HCC) 12/26/2016   Anxiety    Depression    Diabetes mellitus without complication (HCC)    Metastatic cancer to brain (HCC) 08/05/2022   Metastatic cancer to spine (HCC) 07/19/2022   Pancreatitis unk   Primary lung adenocarcinoma (HCC) 08/05/2022   Suicidal ideation    Type 2 diabetes mellitus with hyperglycemia, without long-term current use of insulin  (HCC) 06/13/2022   Formatting of this note might be different from the original. 06/13/2022 A1C 13.8, FSBG 414 Start empagliflozin  5mg /metformin  1000 daily, levemir 20 qhs. (Samples given) Will apply for medassist for pharmacy    ALLERGIES:  is allergic to penicillins and percocet  [oxycodone -acetaminophen ].  MEDICATIONS:  Current Outpatient Medications  Medication Sig Dispense Refill   famotidine (PEPCID) 40 MG tablet Take 1 tablet (40 mg total) by mouth daily. 30 tablet 3   Accu-Chek Softclix Lancets lancets Use to check blood sugar 3 times daily. E11.69 100 each 6   acetaminophen  (TYLENOL ) 500 MG tablet Take 1,000 mg by mouth every 6 (six) hours as needed for moderate pain (pain score 4-6).     atorvastatin  (LIPITOR) 40 MG tablet Take 1 tablet (40 mg total) by mouth daily. 90 tablet 1   buPROPion  (WELLBUTRIN  XL) 150 MG 24 hr tablet Take 1 tablet (150 mg total) by mouth daily. 90 tablet 1   cetirizine  (ZYRTEC ) 10 MG tablet Take 1 tablet (10 mg total) by mouth daily. 30 tablet 1   Continuous Glucose Receiver (FREESTYLE LIBRE 3 READER) DEVI Use to check blood sugar continuously throughout the day. 1 each 0   Continuous Glucose Sensor (FREESTYLE LIBRE 3 PLUS SENSOR) MISC Change sensor every 15 days. Use to check blood sugar continuously. 2 each 6   empagliflozin  (JARDIANCE ) 25 MG TABS tablet Take 1 tablet (25 mg total) by mouth daily before breakfast. 90 tablet 1   fluticasone  (FLONASE ) 50 MCG/ACT nasal spray Place 2 sprays into both nostrils daily. 16 g 1   folic acid  (FOLVITE ) 1 MG tablet Take 1 tablet (1 mg total) by mouth daily. 30 tablet 4   gabapentin  (NEURONTIN ) 300 MG capsule Take 2 capsules (600 mg total) by mouth 3 (three) times daily. 180 capsule 1   glucose blood (ACCU-CHEK GUIDE TEST) test strip Use as instructed twice  daily 100 each 12   glucose blood (ACCU-CHEK GUIDE) test strip Use to check blood sugar 3 times daily. E11.69 100 each 6   insulin  glargine (LANTUS  SOLOSTAR) 100 UNIT/ML Solostar Pen Inject 50 Units into the skin daily. 30 mL 11   Insulin  Pen Needle 32G X 4 MM MISC Use with Insulin  pen 100 each 3   methocarbamol  (ROBAXIN ) 500 MG tablet Take 1 tablet (500 mg total) by mouth every 6 (six) hours as needed for muscle spasms. 90 tablet 0   oxyCODONE   (OXYCONTIN ) 10 mg 12 hr tablet Take 1 tablet (10 mg total) by mouth every 12 (twelve) hours. 60 tablet 0   Oxycodone  HCl 10 MG TABS Take 1 tablet (10 mg total) by mouth every 6 (six) hours as needed. 60 tablet 0   tadalafil  (CIALIS ) 10 MG tablet Take 1 tablet (10 mg total) by mouth every other day as needed for erectile dysfunction. 10 tablet 2   No current facility-administered medications for this visit.   Facility-Administered Medications Ordered in Other Visits  Medication Dose Route Frequency Provider Last Rate Last Admin   sodium chloride  flush (NS) 0.9 % injection 10 mL  10 mL Intracatheter PRN Marlene Simas, MD   10 mL at 04/24/24 1504   VITAL SIGNS: There were no vitals taken for this visit. There were no vitals filed for this visit.  Estimated body mass index is 23.48 kg/m as calculated from the following:   Height as of an earlier encounter on 04/24/24: 5\' 8"  (1.727 m).   Weight as of an earlier encounter on 04/24/24: 154 lb 6.4 oz (70 kg).   PERFORMANCE STATUS (ECOG) : 1 - Symptomatic but completely ambulatory  Physical Exam General: NAD Cardiovascular: regular rate and rhythm Pulmonary: normal breathing pattern Extremities: no edema, no joint deformities Skin: no rashes Neurological: AAO x3  IMPRESSION: Discussed the use of AI scribe software for clinical note transcription with the patient, who gave verbal consent to proceed.  History of Present Illness Samuel [name] is a 58 year old male who presents for symptom management follow-up. Patient reports he is doing well overall. Last week, he experienced constipation, which resolved with the use of docusate sodium  (Docolax). He denies any current issues with constipation or diarrhea. We discussed daily use of stool softener for bowel management.  Denies concerns for nausea, vomiting, or diarrhea.   [name] states his appetite is good.  Weight is stable at 154lbs. He has experienced chest discomfort for the past two  days, located in the middle of his chest, which he describes as similar to heartburn. He occasionally takes Tums or Pepto Bismol for relief. Eduction provided on use of famotidine for indigestion.   We discussed his overall pain at length.  His pain is well-controlled with OxyContin  every 12 hours and oxycodone  as needed for breakthrough pain.  Does not require breakthrough medication around-the-clock.  Some days is able to go without.  Has been able to decrease gabapentin  600 mg down to twice a day versus 3 times daily.  No adjustments to current regimen at this time.  All questions answered and support provided.   Assessment & Plan Cancer Related Pain Pain is well controlled with oxycodone  10 mg every six hours as needed and OxyContin  every twelve hours. Reports adequate pain control with current regimen.  Is not requiring frequent use of breakthrough medication. - Continue oxycodone  10 mg every six hours as needed - Continue OxyContin  every twelve hours - Continue with  gabapentin  600 mg.  Patient has been able to decrease to twice daily.  Heartburn Intermittent chest discomfort resembling heartburn. Pepto Bismol used occasionally. - Prescribed famotidine 40 mg once daily. Sent prescription to [facility] pharmacy for insurance coverage.  Follow-up Follow-up appointments scheduled to monitor ongoing management of chronic conditions. - Schedule follow-up appointment in 4-6 weeks. Sooner if needed.  Patient expressed understanding and was in agreement with this plan. He also understands that He can call the clinic at any time with any questions, concerns, or complaints.   Any controlled substances utilized were prescribed in the context of palliative care. PDMP has been reviewed.   Visit consisted of counseling and education dealing with the complex and emotionally intense issues of symptom management and palliative care in the setting of serious and potentially life-threatening  illness.  Dellia Ferguson, AGPCNP-BC  Palliative Medicine Team/[facility]

## 2024-04-25 ENCOUNTER — Ambulatory Visit (INDEPENDENT_AMBULATORY_CARE_PROVIDER_SITE_OTHER): Admitting: Physician Assistant

## 2024-04-25 ENCOUNTER — Other Ambulatory Visit (HOSPITAL_COMMUNITY): Payer: Self-pay

## 2024-04-25 VITALS — BP 128/80 | HR 92 | Temp 98.2°F | Ht 68.0 in | Wt 154.8 lb

## 2024-04-25 DIAGNOSIS — F411 Generalized anxiety disorder: Secondary | ICD-10-CM | POA: Diagnosis not present

## 2024-04-25 DIAGNOSIS — F322 Major depressive disorder, single episode, severe without psychotic features: Secondary | ICD-10-CM | POA: Diagnosis not present

## 2024-04-25 DIAGNOSIS — F4310 Post-traumatic stress disorder, unspecified: Secondary | ICD-10-CM | POA: Diagnosis not present

## 2024-04-25 DIAGNOSIS — F17200 Nicotine dependence, unspecified, uncomplicated: Secondary | ICD-10-CM

## 2024-04-25 MED ORDER — ESCITALOPRAM OXALATE 10 MG PO TABS
10.0000 mg | ORAL_TABLET | Freq: Every day | ORAL | 1 refills | Status: DC
Start: 2024-04-25 — End: 2024-06-11
  Filled 2024-04-25: qty 30, 30d supply, fill #0

## 2024-04-25 MED ORDER — BUPROPION HCL ER (XL) 150 MG PO TB24
150.0000 mg | ORAL_TABLET | Freq: Every day | ORAL | 1 refills | Status: DC
  Filled 2024-04-25 (×2): qty 90, 90d supply, fill #0

## 2024-04-25 MED ORDER — ESCITALOPRAM OXALATE 5 MG PO TABS
ORAL_TABLET | ORAL | 0 refills | Status: DC
Start: 2024-04-25 — End: 2024-06-04
  Filled 2024-04-25: qty 6, 6d supply, fill #0

## 2024-04-25 NOTE — Progress Notes (Signed)
 [facility] MD/PA/NP OP Progress Note  04/25/2024 11:11 AM Samuel [name]  MRN:  [id number]  Chief Complaint:  Chief Complaint  Patient presents with   Follow-up   Medication Management   HPI:   Samuel [name] is a 58 year old male with a past psychiatric history significant for generalized anxiety disorder, major depressive disorder (severe), PTSD, and tobacco dependence who presents to [facility] for follow-up and medication management.  Patient is currently being managed on the following psychiatric medication: Bupropion  (Wellbutrin  XL) 150 mg 24-hour tablet daily.  Patient informed provider that he has a history of being on Lexapro .  He reports that Lexapro  was first given to him after being hospitalized at [facility].  Per chart review, patient was admitted to [facility] on 03/10/2021 from [facility] after presenting to [facility] with suicidal ideation and a plan to jump in to traffic post panic attack.  Patient was discharged on 03/15/2021 on the following psychiatric medications:  Escitalopram  20 mg daily Hydroxyzine  25 mg every 6 hours as needed Trazodone  50 mg at bedtime  Patient reports that he would like to be prescribed escitalopram  (Lexapro ).  He reports that there are occasions where he does not have the motivation to leave his house.  He continues to endorse depression and rates his depression at a 5 out of 10 with 10 being most severe.  Patient endorses depressive episodes 3 days out of the week.  Patient endorses the following depressive symptoms: decreased concentration, lack of motivation, decreased energy, feelings of guilt/worthlessness, and hopelessness.  Patient denies feelings of sadness or irritability.  In addition to depression, patient continues to endorse anxiety and rates his anxiety a 3 out of 10.  Patient denies any new stressors at this time.  A PHQ-9  screen was performed with the patient scoring a 13.  A GAD-7 screen was also performed with the patient scoring an 8.  Patient is alert and oriented x 4, calm, cooperative, and fully engaged in conversation during the encounter.  Patient describes his mood as upbeat.  Patient exhibits euthymic mood with appropriate affect.  Patient denies suicidal or homicidal ideations.  He further denies auditory or visual hallucinations and does not appear to be responding to internal/external stimuli.  Patient endorses fair sleep and receives on average 4 to 5 hours of sleep per night.  Patient endorses fair appetite needs on average 2 meals per day.  Patient denies alcohol  consumption.  Patient endorses tobacco use and smokes on average stroke cigarettes per day.  Patient denies illicit drug use.  Visit Diagnosis:    ICD-10-CM   1. GAD (generalized anxiety disorder)  F41.1 escitalopram  (LEXAPRO ) 5 MG tablet    escitalopram  (LEXAPRO ) 10 MG tablet    2. Tobacco dependence  F17.200 buPROPion  (WELLBUTRIN  XL) 150 MG 24 hr tablet    3. Major depressive disorder, severe (HCC)  F32.2 escitalopram  (LEXAPRO ) 5 MG tablet    escitalopram  (LEXAPRO ) 10 MG tablet    buPROPion  (WELLBUTRIN  XL) 150 MG 24 hr tablet    4. PTSD (post-traumatic stress disorder)  F43.10 escitalopram  (LEXAPRO ) 5 MG tablet    escitalopram  (LEXAPRO ) 10 MG tablet      Past Psychiatric History:  Patient endorses a past psychiatric history significant for anxiety, major depressive disorder, and PTSD.   Patient endorses a past history of hospitalization due to mental health. He reports that his last hospitalization occurred 3.5 - 4  years ago. - Patient reports that he was hospitalized at [facility] due to stress and crying spells related to his current living situation.   Patient denies a past history of suicide attempt.   Patient denies a past history of homicide attempt.  Past Medical History:  Past Medical History:   Diagnosis Date   Alcohol  use disorder, severe, dependence (HCC) 12/26/2016   Anxiety    Depression    Diabetes mellitus without complication (HCC)    Metastatic cancer to brain (HCC) 08/05/2022   Metastatic cancer to spine (HCC) 07/19/2022   Pancreatitis unk   Primary lung adenocarcinoma (HCC) 08/05/2022   Suicidal ideation    Type 2 diabetes mellitus with hyperglycemia, without long-term current use of insulin  (HCC) 06/13/2022   Formatting of this note might be different from the original. 06/13/2022 A1C 13.8, FSBG 414 Start empagliflozin  5mg /metformin  1000 daily, levemir 20 qhs. (Samples given) Will apply for medassist for pharmacy    Past Surgical History:  Procedure Laterality Date   APPLICATION OF ROBOTIC ASSISTANCE FOR SPINAL PROCEDURE N/A 02/27/2024   Procedure: APPLICATION OF ROBOTIC ASSISTANCE FOR SPINAL PROCEDURE;  Surgeon: Augusto Blonder, MD;  Location: [facility] OR;  Service: Neurosurgery;  Laterality: N/A;   IR BONE TUMOR(S)RF ABLATION  08/01/2022   IR IMAGING GUIDED PORT INSERTION  09/21/2022   IR KYPHO LUMBAR INC FX REDUCE BONE BX UNI/BIL CANNULATION INC/IMAGING  08/01/2022   LAMINECTOMY WITH POSTERIOR LATERAL ARTHRODESIS LEVEL 4 N/A 02/27/2024   Procedure: Lumbar two LAMINECTOMY, Thoracic twelve-Lumbar four INSTRUMENTED STABILIZATION;  Surgeon: Augusto Blonder, MD;  Location: [facility] OR;  Service: Neurosurgery;  Laterality: N/A;    Family Psychiatric History:  Patient denies a family history of psychiatric illness.  Family History: History reviewed. No pertinent family history.  Social History:  Social History   Socioeconomic History   Marital status: Single    Spouse name: Not on file   Number of children: 0   Years of education: Not on file   Highest education level: Associate degree: academic program  Occupational History   Not on file  Tobacco Use   Smoking status: Every Day    Current packs/day: 0.50    Average packs/day: 0.5 packs/day for 1.5 years (0.7 ttl  pk-yrs)    Types: Cigarettes    Start date: 11/16/2022    Last attempt to quit: 08/17/2022   Smokeless tobacco: Never  Vaping Use   Vaping status: Never Used  Substance and Sexual Activity   Alcohol  use: Yes    Alcohol /week: 2.0 standard drinks of alcohol     Types: 2 Cans of beer per week   Drug use: No   Sexual activity: Yes    Partners: Female    Birth control/protection: None  Other Topics Concern   Not on file  Social History Narrative   Not on file   Social Drivers of Health   Financial Resource Strain: Medium Risk (04/23/2024)   Overall Financial Resource Strain (CARDIA)    Difficulty of Paying Living Expenses: Somewhat hard  Food Insecurity: No Food Insecurity (03/28/2024)   Hunger Vital Sign    Worried About Running Out of Food in the Last Year: Never true    Ran Out of Food in the Last Year: Never true  Transportation Needs: No Transportation Needs (03/28/2024)   PRAPARE - Administrator, Civil Service (Medical): No    Lack of Transportation (Non-Medical): No  Physical Activity: Insufficiently Active (04/23/2024)   Exercise Vital Sign  Days of Exercise per Week: 2 days    Minutes of Exercise per Session: 40 min  Stress: Stress Concern Present (04/23/2024)   [employer] of Occupational Health - Occupational Stress Questionnaire    Feeling of Stress : To some extent  Social Connections: Moderately Isolated (04/23/2024)   Social Connection and Isolation Panel [NHANES]    Frequency of Communication with Friends and Family: Twice a week    Frequency of Social Gatherings with Friends and Family: More than three times a week    Attends Religious Services: 1 to 4 times per year    Active Member of [employer] or Organizations: No    Attends Banker Meetings: Never    Marital Status: Divorced    Allergies:  Allergies  Allergen Reactions   Penicillins Hives   Percocet [Oxycodone -Acetaminophen ] Nausea Only and Other (See Comments)    Pt states the  pill is hard to swallow. States he was told it may be the coating on the tablet that he is allergic to.     Metabolic Disorder Labs: Lab Results  Component Value Date   HGBA1C 8.7 (H) 02/27/2024   MPG 202.99 02/27/2024   MPG 194.38 08/28/2023   No results found for: PROLACTIN Lab Results  Component Value Date   CHOL 133 07/12/2023   TRIG 64 07/12/2023   HDL 41 07/12/2023   CHOLHDL 3.2 07/12/2023   VLDL 23 03/11/2021   LDLCALC 79 07/12/2023   LDLCALC 61 01/19/2023   Lab Results  Component Value Date   TSH 0.490 03/11/2024   TSH 1.158 01/08/2024    Therapeutic Level Labs: No results found for: LITHIUM No results found for: VALPROATE No results found for: CBMZ  Current Medications: Current Outpatient Medications  Medication Sig Dispense Refill   escitalopram  (LEXAPRO ) 10 MG tablet Take 1 tablet (10 mg total) by mouth daily. 30 tablet 1   escitalopram  (LEXAPRO ) 5 MG tablet Patient to take 1 tablet (5 mg) for 6 days before titrating to 10 mg daily 6 tablet 0   Accu-Chek Softclix Lancets lancets Use to check blood sugar 3 times daily. E11.69 100 each 6   acetaminophen  (TYLENOL ) 500 MG tablet Take 1,000 mg by mouth every 6 (six) hours as needed for moderate pain (pain score 4-6).     atorvastatin  (LIPITOR) 40 MG tablet Take 1 tablet (40 mg total) by mouth daily. 90 tablet 1   buPROPion  (WELLBUTRIN  XL) 150 MG 24 hr tablet Take 1 tablet (150 mg total) by mouth daily. 90 tablet 1   cetirizine  (ZYRTEC ) 10 MG tablet Take 1 tablet (10 mg total) by mouth daily. 30 tablet 1   Continuous Glucose Receiver (FREESTYLE LIBRE 3 READER) DEVI Use to check blood sugar continuously throughout the day. 1 each 0   Continuous Glucose Sensor (FREESTYLE LIBRE 3 PLUS SENSOR) MISC Change sensor every 15 days. Use to check blood sugar continuously. 2 each 6   empagliflozin  (JARDIANCE ) 25 MG TABS tablet Take 1 tablet (25 mg total) by mouth daily before breakfast. 90 tablet 1   famotidine  (PEPCID )  40 MG tablet Take 1 tablet (40 mg total) by mouth daily. 30 tablet 3   fluticasone  (FLONASE ) 50 MCG/ACT nasal spray Place 2 sprays into both nostrils daily. 16 g 1   folic acid  (FOLVITE ) 1 MG tablet Take 1 tablet (1 mg total) by mouth daily. 30 tablet 4   gabapentin  (NEURONTIN ) 300 MG capsule Take 2 capsules (600 mg total) by mouth 3 (three) times daily. 180  capsule 1   glucose blood (ACCU-CHEK GUIDE TEST) test strip Use as instructed twice daily 100 each 12   glucose blood (ACCU-CHEK GUIDE) test strip Use to check blood sugar 3 times daily. E11.69 100 each 6   insulin  glargine (LANTUS  SOLOSTAR) 100 UNIT/ML Solostar Pen Inject 50 Units into the skin daily. 30 mL 11   Insulin  Pen Needle 32G X 4 MM MISC Use with Insulin  pen 100 each 3   methocarbamol  (ROBAXIN ) 500 MG tablet Take 1 tablet (500 mg total) by mouth every 6 (six) hours as needed for muscle spasms. 90 tablet 0   oxyCODONE  (OXYCONTIN ) 10 mg 12 hr tablet Take 1 tablet (10 mg total) by mouth every 12 (twelve) hours. 60 tablet 0   Oxycodone  HCl 10 MG TABS Take 1 tablet (10 mg total) by mouth every 6 (six) hours as needed. 60 tablet 0   tadalafil  (CIALIS ) 10 MG tablet Take 1 tablet (10 mg total) by mouth every other day as needed for erectile dysfunction. 10 tablet 2   No current facility-administered medications for this visit.     Musculoskeletal: Strength & Muscle Tone: within normal limits Gait & Station: normal Patient leans: N/A  Psychiatric Specialty Exam: Review of Systems  Psychiatric/Behavioral:  Positive for dysphoric mood and sleep disturbance. Negative for decreased concentration, hallucinations, self-injury and suicidal ideas. The patient is nervous/anxious. The patient is not hyperactive.     Blood pressure 128/80, pulse 92, temperature 98.2 F (36.8 C), temperature source Oral, height 5' 8 (1.727 m), weight 154 lb 12.8 oz (70.2 kg), SpO2 97%.Body mass index is 23.54 kg/m.  General Appearance: Casual  Eye Contact:   Good  Speech:  Clear and Coherent and Normal Rate  Volume:  Normal  Mood:  Anxious and Depressed  Affect:  Congruent  Thought Process:  Coherent, Goal Directed, and Descriptions of Associations: Intact  Orientation:  Full (Time, Place, and Person)  Thought Content: WDL   Suicidal Thoughts:  No  Homicidal Thoughts:  No  Memory:  Immediate;   Good Recent;   Good Remote;   Good  Judgement:  Good  Insight:  Good  Psychomotor Activity:  Normal  Concentration:  Concentration: Good and Attention Span: Good  Recall:  Good  Fund of Knowledge: Good  Language: Good  Akathisia:  No  Handed:  Right  AIMS (if indicated): not done  Assets:  Communication Skills Desire for Improvement Financial Resources/Insurance Housing Social Support Transportation  ADL's:  Intact  Cognition: WNL  Sleep:  Fair   Screenings: AIMS    Flowsheet Row [facility] (Discharged) from 12/23/2016 in [facility]  AIMS Total Score 0 0      AUDIT    Flowsheet Row [facility]) [facility] Admission (Discharged) from 12/23/2016 in [facility] INPATIENT BEHAVIORAL MEDICINE  Alcohol  Use Disorder Identification Test Final Score (AUDIT) 10 29      CAGE-AID    Flowsheet Row Office Visit from 03/22/2024 in [facility]  CAGE-AID Score 4      GAD-7    Flowsheet Row Clinical Support from 04/25/2024 in [facility] Counselor from 04/23/2024 in [facility] Office Visit from 03/22/2024 in [facility] Office Visit from 01/18/2024 in [facility] [place] Office Visit from 07/12/2023 in [facility] [place]  Total GAD-7 Score 8 7 9 9 11       PHQ2-9     Flowsheet Row Clinical Support from 04/25/2024 in [facility] Counselor from 04/23/2024 in [facility] Office Visit from 03/22/2024 in [facility] Office Visit from 01/18/2024 in [facility] [place] [facility] [place]. [facility] Office Visit from 07/12/2023 in [facility] [place] [facility]. [facility]  PHQ-2 Total Score 4 2 2 2 4   PHQ-9 Total Score 13 9 9 10 16       Flowsheet Row Clinical Support from 04/25/2024 in [facility] Counselor from 04/23/2024 in [facility] Office Visit from 03/22/2024 in [facility]  C-SSRS RISK CATEGORY No Risk No Risk No Risk        Assessment and Plan:   Samuel [name] is a 58 year old male with a past psychiatric history significant for generalized anxiety disorder, major depressive disorder (severe), PTSD, and tobacco dependence who presents to [facility] for follow-up and medication management.  Patient presents to the encounter stating that he would like to be placed back on escitalopram .  He reports that he has been on escitalopram  in the past and states that the medication was helpful in managing his symptoms.  Patient continues to endorse ongoing depression and anxiety not managed by his current use of bupropion  (Wellbutrin  XL).  A PHQ-9 screen was performed with the patient scoring a 13.  A GAD-7 screen was also performed with the patient scoring an 8.  Provider recommended patient be placed on escitalopram  5 mg for 6 days, followed by 10 mg daily for the management of his depressive symptoms and anxiety.  Patient was agreeable to recommendation.  Patient's medications to be e-prescribed to pharmacy of choice.  Patient denies suicidal ideations and is able to contract for safety following the  conclusion of the encounter.  Collaboration of Care: Collaboration of Care: Medication Management AEB provider managing patient's psychiatric medications, Primary Care Provider AEB patient being followed by her primary care provider (internal medicine), Psychiatrist AEB patient being followed by mental health provider at this facility, Other provider involved in patient's care AEB patient being seen by oncology, and Referral or follow-up with counselor/therapist AEB patient being seen by licensed clinical social worker at this facility.  Patient/Guardian was advised Release of Information must be obtained prior to any record release in order to collaborate their care with an outside provider. Patient/Guardian was advised if they have not already done so to contact the registration department to sign all necessary forms in order for us  to release information regarding their care.   Consent: Patient/Guardian gives verbal consent for treatment and assignment of benefits for services provided during this visit. Patient/Guardian expressed understanding and agreed to proceed.   1. Tobacco dependence  - buPROPion  (WELLBUTRIN  XL) 150 MG 24 hr tablet; Take 1 tablet (150 mg total) by mouth daily.  Dispense: 90 tablet; Refill: 1  2. GAD (generalized anxiety disorder) (Primary)  - escitalopram  (LEXAPRO ) 5 MG tablet; Patient to take 1 tablet (5 mg) for 6 days before titrating to 10 mg daily  Dispense: 6 tablet; Refill: 0 - escitalopram  (LEXAPRO ) 10 MG tablet; Take 1 tablet (10 mg total) by mouth daily.  Dispense: 30 tablet; Refill:  1  3. Major depressive disorder, severe (HCC)  - escitalopram  (LEXAPRO ) 5 MG tablet; Patient to take 1 tablet (5 mg) for 6 days before titrating to 10 mg daily  Dispense: 6 tablet; Refill: 0 - escitalopram  (LEXAPRO ) 10 MG tablet; Take 1 tablet (10 mg total) by mouth daily.  Dispense: 30 tablet; Refill: 1 - buPROPion  (WELLBUTRIN  XL) 150 MG 24 hr tablet; Take 1 tablet (150 mg total)  by mouth daily.  Dispense: 90 tablet; Refill: 1  4. PTSD (post-traumatic stress disorder)  - escitalopram  (LEXAPRO ) 5 MG tablet; Patient to take 1 tablet (5 mg) for 6 days before titrating to 10 mg daily  Dispense: 6 tablet; Refill: 0 - escitalopram  (LEXAPRO ) 10 MG tablet; Take 1 tablet (10 mg total) by mouth daily.  Dispense: 30 tablet; Refill: 1  Patient to follow-up in 6 weeks Provider spent a total of 17 minutes with the patient/reviewing patient's chart  Gates Kasal, PA 04/25/2024, 11:11 AM

## 2024-05-01 ENCOUNTER — Encounter (HOSPITAL_COMMUNITY): Payer: Self-pay | Admitting: Physician Assistant

## 2024-05-06 ENCOUNTER — Other Ambulatory Visit (HOSPITAL_COMMUNITY): Payer: Self-pay

## 2024-05-06 ENCOUNTER — Encounter: Payer: Self-pay | Admitting: Family Medicine

## 2024-05-06 ENCOUNTER — Encounter: Payer: Self-pay | Admitting: Internal Medicine

## 2024-05-06 ENCOUNTER — Ambulatory Visit: Attending: Family Medicine | Admitting: Family Medicine

## 2024-05-06 VITALS — BP 106/69 | HR 87 | Ht 68.0 in | Wt 156.6 lb

## 2024-05-06 DIAGNOSIS — C3490 Malignant neoplasm of unspecified part of unspecified bronchus or lung: Secondary | ICD-10-CM

## 2024-05-06 DIAGNOSIS — Z794 Long term (current) use of insulin: Secondary | ICD-10-CM | POA: Diagnosis not present

## 2024-05-06 DIAGNOSIS — E1142 Type 2 diabetes mellitus with diabetic polyneuropathy: Secondary | ICD-10-CM | POA: Diagnosis not present

## 2024-05-06 DIAGNOSIS — J329 Chronic sinusitis, unspecified: Secondary | ICD-10-CM

## 2024-05-06 DIAGNOSIS — R0989 Other specified symptoms and signs involving the circulatory and respiratory systems: Secondary | ICD-10-CM

## 2024-05-06 DIAGNOSIS — C7951 Secondary malignant neoplasm of bone: Secondary | ICD-10-CM | POA: Diagnosis not present

## 2024-05-06 DIAGNOSIS — N529 Male erectile dysfunction, unspecified: Secondary | ICD-10-CM

## 2024-05-06 DIAGNOSIS — Z1211 Encounter for screening for malignant neoplasm of colon: Secondary | ICD-10-CM

## 2024-05-06 MED ORDER — SILDENAFIL CITRATE 100 MG PO TABS
50.0000 mg | ORAL_TABLET | Freq: Every day | ORAL | 11 refills | Status: AC | PRN
  Filled 2024-05-06: qty 5, 5d supply, fill #0
  Filled 2024-05-19: qty 5, 5d supply, fill #1
  Filled 2024-06-21: qty 5, 5d supply, fill #2
  Filled 2024-07-07: qty 5, 5d supply, fill #3
  Filled 2024-07-23: qty 5, 5d supply, fill #4
  Filled 2024-08-08: qty 5, 5d supply, fill #5
  Filled 2024-08-22: qty 5, 5d supply, fill #6
  Filled 2024-09-12: qty 5, 5d supply, fill #7
  Filled 2024-10-18: qty 5, 5d supply, fill #8
  Filled 2024-11-06: qty 5, 5d supply, fill #9
  Filled 2024-12-06: qty 5, 5d supply, fill #10
  Filled 2024-12-22: qty 5, 5d supply, fill #11

## 2024-05-06 MED ORDER — DOXYCYCLINE HYCLATE 100 MG PO TABS
100.0000 mg | ORAL_TABLET | Freq: Two times a day (BID) | ORAL | 0 refills | Status: DC
  Filled 2024-05-06: qty 20, 10d supply, fill #0

## 2024-05-06 MED ORDER — CETIRIZINE HCL 10 MG PO TABS
10.0000 mg | ORAL_TABLET | Freq: Every day | ORAL | 1 refills | Status: DC
  Filled 2024-05-06: qty 30, 30d supply, fill #0
  Filled 2024-08-25: qty 30, 30d supply, fill #1

## 2024-05-06 MED ORDER — FLUTICASONE PROPIONATE 50 MCG/ACT NA SUSP
2.0000 | Freq: Every day | NASAL | 1 refills | Status: DC
  Filled 2024-05-06: qty 16, 30d supply, fill #0
  Filled 2024-06-29: qty 16, 30d supply, fill #1

## 2024-05-06 NOTE — Patient Instructions (Signed)
 VISIT SUMMARY:  Today, we discussed your ongoing symptoms of weakness, body aches, and fatigue, which have been present for four days. You also reported a slight cough, chest congestion, and occasional shortness of breath. We reviewed your current treatment for cancer and diabetes, and made some adjustments to your medications and management plan.  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 There are no new symptoms related to the treatment, so you can maintain your chemotherapy schedule as planned.  -UPPER RESPIRATORY TRACT INFECTION: Your symptoms suggest a viral infection. To ensure you can continue your chemotherapy, we have prescribed doxycycline . We also refilled your cetirizine  and fluticasone  to help manage your symptoms.  -TYPE 2 DIABETES MELLITUS WITH HYPERGLYCEMIA: Your blood sugar levels have improved, but you are experiencing morning hypoglycemia. We recommend adjusting your evening insulin  dose based on your bedtime glucose levels and having a nighttime snack if you eat dinner early to prevent low blood sugar.  -PERIPHERAL NEUROPATHY: The numbness and tingling in your feet have improved and are now only in the first and second toes of your right foot. Please continue to check your feet for any ulcers.  -ERECTILE DYSFUNCTION: You have experienced chest pain with tadalafil , so we are switching your medication to sildenafil  to avoid these side effects.  -GENERAL HEALTH MAINTENANCE: We discussed the importance of healthy eating habits for managing your diabetes and cholesterol. You are encouraged to quit smoking with the help of Wellbutrin . We also recommend a fasting cholesterol test at your next visit and a colon cancer screening with Cologuard.  INSTRUCTIONS:  Please schedule a follow-up visit in three months. Also, remember your endocrinologist appointment on  June 26.

## 2024-05-06 NOTE — Progress Notes (Signed)
 Subjective:  Patient ID: Samuel [name], male    DOB: 23-Jan-1966  Age: 58 y.o. MRN: [id number]  CC: Medical Management of Chronic Issues (Body weakness/fatigue for 4 days/)     Discussed the use of AI scribe software for clinical note transcription with the patient, who gave verbal consent to proceed.  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  He has experienced weakness, body aches, and fatigue for four days, with decreased appetite and increased sleep. A brief episode of diarrhea occurred two days ago. Upper respiratory symptoms include a slight cough, chest congestion, and occasional shortness of breath, despite using Flonase  and Zyrtec . His girlfriend had a cough earlier in the week. Continues to smoke but states Wellbutrin  is helping him and cutting back  He is undergoing palliative chemotherapy, with the last infusion two weeks ago. He manages diabetes with insulin , taking 26 units of Lantus  in the morning and 14 units in the evening. Blood sugars average 172 mg/dL during the day, with morning hypoglycemia episodes causing sweating and requiring juice. Irregular eating habits, often late at night, complicate insulin  management.  Numbness and tingling in his feet have improved, now localized to the first and second toes of the right foot. He is requesting switching from Cialis  to Viagra  as he complains of chest pains with Cialis .   Past Medical History:  Diagnosis Date   Alcohol  use disorder, severe, dependence (HCC) 12/26/2016   Anxiety    Depression    Diabetes mellitus without complication  (HCC)    Metastatic cancer to brain (HCC) 08/05/2022   Metastatic cancer to spine (HCC) 07/19/2022   Pancreatitis unk   Primary lung adenocarcinoma (HCC) 08/05/2022   Suicidal ideation    Type 2 diabetes mellitus with hyperglycemia, without long-term current use of insulin  (HCC) 06/13/2022   Formatting of this note might be different from the original. 06/13/2022 A1C 13.8, FSBG 414 Start empagliflozin  5mg /metformin  1000 daily, levemir 20 qhs. (Samples given) Will apply for medassist for pharmacy    Past Surgical History:  Procedure Laterality Date   APPLICATION OF ROBOTIC ASSISTANCE FOR SPINAL PROCEDURE N/A 02/27/2024   Procedure: APPLICATION OF ROBOTIC ASSISTANCE FOR SPINAL PROCEDURE;  Surgeon: Augusto Blonder, MD;  Location: [facility] OR;  Service: Neurosurgery;  Laterality: N/A;   IR BONE TUMOR(S)RF ABLATION  08/01/2022   IR IMAGING GUIDED PORT INSERTION  09/21/2022   IR KYPHO LUMBAR INC FX REDUCE BONE BX UNI/BIL CANNULATION INC/IMAGING  08/01/2022   LAMINECTOMY WITH POSTERIOR LATERAL ARTHRODESIS LEVEL 4 N/A 02/27/2024   Procedure: Lumbar two LAMINECTOMY, Thoracic twelve-Lumbar four INSTRUMENTED STABILIZATION;  Surgeon: Augusto Blonder, MD;  Location: [facility] OR;  Service: Neurosurgery;  Laterality: N/A;    No family history on file.  Social History   Socioeconomic History   Marital status: Single    Spouse name: Not on file   Number of children: 0   Years of education: Not on file   Highest education level: Associate degree: academic program  Occupational History   Not on file  Tobacco Use   Smoking status: Every Day    Current packs/day: 0.50    Average packs/day: 0.5 packs/day  for 1.5 years (0.7 ttl pk-yrs)    Types: Cigarettes    Start date: 11/16/2022    Last attempt to quit: 08/17/2022   Smokeless tobacco: Never  Vaping Use   Vaping status: Never Used  Substance and Sexual Activity   Alcohol  use: Yes    Alcohol /week: 2.0 standard drinks of alcohol     Types: 2 Cans of beer per  week   Drug use: No   Sexual activity: Yes    Partners: Female    Birth control/protection: None  Other Topics Concern   Not on file  Social History Narrative   Not on file   Social Drivers of Health   Financial Resource Strain: Medium Risk (04/23/2024)   Overall Financial Resource Strain (CARDIA)    Difficulty of Paying Living Expenses: Somewhat hard  Food Insecurity: No Food Insecurity (03/28/2024)   Hunger Vital Sign    Worried About Running Out of Food in the Last Year: Never true    Ran Out of Food in the Last Year: Never true  Transportation Needs: No Transportation Needs (03/28/2024)   PRAPARE - Administrator, Civil Service (Medical): No    Lack of Transportation (Non-Medical): No  Physical Activity: Insufficiently Active (04/23/2024)   Exercise Vital Sign    Days of Exercise per Week: 2 days    Minutes of Exercise per Session: 40 min  Stress: Stress Concern Present (04/23/2024)   [employer] of Occupational Health - Occupational Stress Questionnaire    Feeling of Stress : To some extent  Social Connections: Moderately Isolated (04/23/2024)   Social Connection and Isolation Panel    Frequency of Communication with Friends and Family: Twice a week    Frequency of Social Gatherings with Friends and Family: More than three times a week    Attends Religious Services: 1 to 4 times per year    Active Member of [employer] or Organizations: No    Attends Banker Meetings: Never    Marital Status: Divorced    Allergies  Allergen Reactions   Penicillins Hives   Percocet [Oxycodone -Acetaminophen ] Nausea Only and Other (See Comments)    Pt states the pill is hard to swallow. States he was told it may be the coating on the tablet that he is allergic to.     Outpatient Medications Prior to Visit  Medication Sig Dispense Refill   acetaminophen  (TYLENOL ) 500 MG tablet Take 1,000 mg by mouth every 6 (six) hours as needed for moderate pain (pain score 4-6).      atorvastatin  (LIPITOR) 40 MG tablet Take 1 tablet (40 mg total) by mouth daily. 90 tablet 1   buPROPion  (WELLBUTRIN  XL) 150 MG 24 hr tablet Take 1 tablet (150 mg total) by mouth daily. 90 tablet 1   Continuous Glucose Receiver (FREESTYLE LIBRE 3 READER) DEVI Use to check blood sugar continuously throughout the day. 1 each 0   Continuous Glucose Sensor (FREESTYLE LIBRE 3 PLUS SENSOR) MISC Change sensor every 15 days. Use to check blood sugar continuously. 2 each 6   empagliflozin  (JARDIANCE ) 25 MG TABS tablet Take 1 tablet (25 mg total) by mouth daily before breakfast. 90 tablet 1   escitalopram  (LEXAPRO ) 5 MG tablet Patient to take 1 tablet (5 mg) for 6 days before titrating to 10 mg daily 6 tablet 0   famotidine  (PEPCID ) 40 MG tablet Take 1 tablet (40 mg total) by mouth daily. 30 tablet 3   folic acid  (FOLVITE ) 1 MG tablet Take 1  tablet (1 mg total) by mouth daily. 30 tablet 4   gabapentin  (NEURONTIN ) 300 MG capsule Take 2 capsules (600 mg total) by mouth 3 (three) times daily. 180 capsule 1   insulin  glargine (LANTUS  SOLOSTAR) 100 UNIT/ML Solostar Pen Inject 50 Units into the skin daily. 30 mL 11   Insulin  Pen Needle 32G X 4 MM MISC Use with Insulin  pen 100 each 3   methocarbamol  (ROBAXIN ) 500 MG tablet Take 1 tablet (500 mg total) by mouth every 6 (six) hours as needed for muscle spasms. 90 tablet 0   oxyCODONE  (OXYCONTIN ) 10 mg 12 hr tablet Take 1 tablet (10 mg total) by mouth every 12 (twelve) hours. 60 tablet 0   Oxycodone  HCl 10 MG TABS Take 1 tablet (10 mg total) by mouth every 6 (six) hours as needed. 60 tablet 0   cetirizine  (ZYRTEC ) 10 MG tablet Take 1 tablet (10 mg total) by mouth daily. 30 tablet 1   fluticasone  (FLONASE ) 50 MCG/ACT nasal spray Place 2 sprays into both nostrils daily. 16 g 1   tadalafil  (CIALIS ) 10 MG tablet Take 1 tablet (10 mg total) by mouth every other day as needed for erectile dysfunction. 10 tablet 2   Accu-Chek Softclix Lancets lancets Use to check blood sugar 3  times daily. E11.69 (Patient not taking: Reported on 05/06/2024) 100 each 6   escitalopram  (LEXAPRO ) 10 MG tablet Take 1 tablet (10 mg total) by mouth daily. (Patient not taking: Reported on 05/06/2024) 30 tablet 1   glucose blood (ACCU-CHEK GUIDE TEST) test strip Use as instructed twice daily (Patient not taking: Reported on 05/06/2024) 100 each 12   glucose blood (ACCU-CHEK GUIDE) test strip Use to check blood sugar 3 times daily. E11.69 (Patient not taking: Reported on 05/06/2024) 100 each 6   No facility-administered medications prior to visit.     ROS Review of Systems  Constitutional:  Positive for appetite change and fatigue. Negative for activity change.  HENT:  Positive for congestion. Negative for sinus pressure and sore throat.   Respiratory:  Positive for cough. Negative for chest tightness, shortness of breath and wheezing.   Cardiovascular:  Negative for chest pain and palpitations.  Gastrointestinal:  Positive for diarrhea. Negative for abdominal distention, abdominal pain and constipation.  Genitourinary: Negative.   Musculoskeletal: Negative.   Psychiatric/Behavioral:  Negative for behavioral problems and dysphoric mood.     Objective:  BP 106/69   Pulse 87   Ht 5' 8 (1.727 m)   Wt 156 lb 9.6 oz (71 kg)   SpO2 99%   BMI 23.81 kg/m      05/06/2024    8:54 AM 04/25/2024   11:10 AM 04/24/2024   11:05 AM  BP/Weight  Systolic BP 106  161  Diastolic BP 69  76  Wt. (Lbs) 156.6  154.4  BMI 23.81 kg/m2  23.48 kg/m2     Information is confidential and restricted. Go to Review Flowsheets to unlock data.      Physical Exam Constitutional:      Appearance: He is well-developed.  HENT:     Right Ear: Tympanic membrane normal.     Left Ear: Tympanic membrane normal.     Mouth/Throat:     Mouth: Mucous membranes are moist.   Cardiovascular:     Rate and Rhythm: Normal rate.     Heart sounds: Normal heart sounds. No murmur heard. Pulmonary:     Effort: Pulmonary  effort is normal.     Breath sounds: Normal  breath sounds. No wheezing or rales.  Chest:     Chest wall: No tenderness.  Abdominal:     General: Bowel sounds are normal. There is no distension.     Palpations: Abdomen is soft. There is no mass.     Tenderness: There is no abdominal tenderness.   Musculoskeletal:        General: Normal range of motion.     Right lower leg: No edema.     Left lower leg: No edema.   Neurological:     Mental Status: He is alert and oriented to person, place, and time.   Psychiatric:        Mood and Affect: Mood normal.        Latest Ref Rng & Units 04/24/2024   10:42 AM 04/01/2024    8:32 AM 03/11/2024    8:24 AM  CMP  Glucose 70 - 99 mg/dL 161  096  045   BUN 6 - 20 mg/dL 16  12  19    Creatinine 0.61 - 1.24 mg/dL 4.09  8.11  9.14   Sodium 135 - 145 mmol/L 134  139  134   Potassium 3.5 - 5.1 mmol/L 4.1  3.9  4.0   Chloride 98 - 111 mmol/L 100  106  103   CO2 22 - 32 mmol/L 29  28  25    Calcium  8.9 - 10.3 mg/dL 78.2  9.2  9.4   Total Protein 6.5 - 8.1 g/dL 8.1  7.2  7.8   Total Bilirubin 0.0 - 1.2 mg/dL 0.4  0.3  0.3   Alkaline Phos 38 - 126 U/L 91  84  85   AST 15 - 41 U/L 22  16  15    ALT 0 - 44 U/L 32  19  25     Lipid Panel     Component Value Date/Time   CHOL 133 07/12/2023 1116   TRIG 64 07/12/2023 1116   HDL 41 07/12/2023 1116   CHOLHDL 3.2 07/12/2023 1116   CHOLHDL 2.4 03/11/2021 1843   VLDL 23 03/11/2021 1843   LDLCALC 79 07/12/2023 1116    CBC    Component Value Date/Time   WBC 7.3 04/24/2024 1042   WBC 5.3 08/29/2023 0354   RBC 4.04 (L) 04/24/2024 1042   HGB 12.8 (L) 04/24/2024 1042   HGB 14.7 12/24/2020 1144   HCT 38.7 (L) 04/24/2024 1042   HCT 44.3 12/24/2020 1144   PLT 263 04/24/2024 1042   PLT 201 12/24/2020 1144   MCV 95.8 04/24/2024 1042   MCV 94 12/24/2020 1144   MCV 96 08/02/2014 1649   MCH 31.7 04/24/2024 1042   MCHC 33.1 04/24/2024 1042   RDW 13.4 04/24/2024 1042   RDW 12.3 12/24/2020 1144   RDW  14.3 08/02/2014 1649   LYMPHSABS 1.4 04/24/2024 1042   LYMPHSABS 2.5 12/24/2020 1144   LYMPHSABS 1.6 09/14/2012 0541   MONOABS 0.7 04/24/2024 1042   MONOABS 1.0 09/14/2012 0541   EOSABS 0.1 04/24/2024 1042   EOSABS 0.1 12/24/2020 1144   EOSABS 0.0 09/14/2012 0541   BASOSABS 0.0 04/24/2024 1042   BASOSABS 0.0 12/24/2020 1144   BASOSABS 0.0 09/14/2012 0541    Lab Results  Component Value Date   HGBA1C 8.7 (H) 02/27/2024      1. DM type 2 with diabetic peripheral neuropathy (HCC) (Primary) Uncontrolled with A1c of 8.7, goal is less than 7.0 He does have some lows in the morning which occurs about once a week  Advised to eat a late night snack to prevent morning hypoglycemia I have reviewed with him instruction regarding his Lantus  administration as discussed at his clinical pharmacy visit Next A1c is due on 05/28/2024  2. Metastatic cancer to spine [facility]) Status post L2 kyphoplasty Lung cancer with metastasis to spine Currently wearing lumbar brace  3. Primary adenocarcinoma of lung, unspecified laterality (HCC) Currently on palliative chemo Unfortunately continues to smoke  4. Screening for colon cancer - Cologuard  5. Chest congestion Placed on doxycycline  prophylactically due to immunosuppressive state  6. Erectile dysfunction, unspecified erectile dysfunction type Discontinue Cialis  and initiate Viagra  - sildenafil  (VIAGRA ) 100 MG tablet; Take 0.5-1 tablets (50-100 mg total) by mouth daily as needed for erectile dysfunction.  Dispense: 5 tablet; Refill: 11  7. Sinusitis, unspecified chronicity, unspecified location - doxycycline  (VIBRA -TABS) 100 MG tablet; Take 1 tablet (100 mg total) by mouth 2 (two) times daily.  Dispense: 20 tablet; Refill: 0 - cetirizine  (ZYRTEC ) 10 MG tablet; Take 1 tablet (10 mg total) by mouth daily.  Dispense: 30 tablet; Refill: 1 - fluticasone  (FLONASE ) 50 MCG/ACT nasal spray; Place 2 sprays into both nostrils daily.  Dispense: 16 g; Refill:  1   Meds ordered this encounter  Medications   sildenafil  (VIAGRA ) 100 MG tablet    Sig: Take 0.5-1 tablets (50-100 mg total) by mouth daily as needed for erectile dysfunction.    Dispense:  5 tablet    Refill:  11   doxycycline  (VIBRA -TABS) 100 MG tablet    Sig: Take 1 tablet (100 mg total) by mouth 2 (two) times daily.    Dispense:  20 tablet    Refill:  0   cetirizine  (ZYRTEC ) 10 MG tablet    Sig: Take 1 tablet (10 mg total) by mouth daily.    Dispense:  30 tablet    Refill:  1   fluticasone  (FLONASE ) 50 MCG/ACT nasal spray    Sig: Place 2 sprays into both nostrils daily.    Dispense:  16 g    Refill:  1    Follow-up: Return in about 3 months (around 08/06/2024) for Chronic medical conditions.       [name], MD, FAAFP. [facility] [place], [place] [phone]   05/06/2024, 1:29 PM

## 2024-05-07 NOTE — Progress Notes (Signed)
 [facility] OFFICE PROGRESS NOTE  Samuel Clam, MD 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    Biomarker Findings Microsatellite status - MS-Stable Tumor Mutational Burden - 8 Muts/Mb Genomic Findings For a complete list of the genes assayed, please refer to the Appendix. KEAP1 Q227* CDKN2A/B [id number] G89V PRKCI amplification TERC amplification - equivocal? TP53 [id number]*18 8 Disease relevant genes with no reportable alterations: ALK, BRAF, EGFR, ERBB2, KRAS, MET, RET, ROS1   PDL1 TPS  90%  PRIOR THERAPY: 1) Status post palliative radiotherapy to the L2 lesion under the care of Dr. Patrcia. 2) Status post SRS to solitary brain metastasis under the care of Dr. Patrcia 3) status post L2 laminectomy for decompression of the thecal sac under the care of Dr. Lanis on 02/27/2024.   CURRENT THERAPY: Palliative systemic therapy with carboplatin  for AUC of 5, Alimta  500 Mg/M2 and Keytruda  200 Mg IV every 3 weeks. First dose September 05, 2022. Status post 29 cycles. Starting from cycle #5, he started on maintenance keytruda  and alimta  IV every 3 weeks.   INTERVAL HISTORY: Samuel [name] 58 y.o. male returns to the clinic today for a follow-up visit. He tolerated his last appointment/infusion well except he does get fatigued.  He saw his PCP in the interval for diabetes management. He also was endorsing weakness, bodyaches, and fatigue for 4 days as well as cough and chest congestions. His girlfriend had a cough too. He was given doxycyline for sinusitis. He feels better from that aspect  today.      He denies fevers.  He has a good appetite. Previously he was endorsing indigestion but has altered his eating habits which has helped. He is not getting this as much. He saw palliative care previously too who prescribed pepcid . Denies nausea or vomiting. He denies diarrhea or constipation. Denies any shortness of breath. He denies cough. He denies hemoptysis. Denies any headache or visual changes. He denies any new rashes.   He is here for evaluation and before undergoing cycle #30  MEDICAL HISTORY: Past Medical History:  Diagnosis Date   Alcohol  use disorder, severe, dependence (HCC) 12/26/2016   Anxiety    Depression    Diabetes mellitus without complication (HCC)    Metastatic cancer to brain (HCC) 08/05/2022   Metastatic cancer to spine (HCC) 07/19/2022   Pancreatitis unk   Primary lung adenocarcinoma (HCC) 08/05/2022   Suicidal ideation    Type 2 diabetes mellitus with hyperglycemia, without long-term current use of insulin  (HCC) 06/13/2022   Formatting of this note might be different from the original. 06/13/2022 A1C 13.8, FSBG 414 Start empagliflozin  5mg /metformin  1000 daily, levemir 20 qhs. (Samples given) Will apply for medassist for pharmacy    ALLERGIES:  is allergic to penicillins and percocet [oxycodone -acetaminophen ].  MEDICATIONS:  Current Outpatient Medications  Medication Sig Dispense Refill   acetaminophen  (TYLENOL ) 500 MG tablet Take 1,000 mg by mouth every 6 (six) hours as needed for moderate pain (pain score 4-6).     atorvastatin  (LIPITOR) 40 MG tablet Take 1 tablet (40 mg total) by mouth daily. 90 tablet 1   buPROPion  (WELLBUTRIN  XL) 150 MG 24 hr  tablet Take 1 tablet (150 mg total) by mouth daily. 90 tablet 1   cetirizine  (ZYRTEC ) 10 MG tablet Take 1 tablet (10 mg total) by mouth daily. 30 tablet 1   Continuous Glucose Receiver (FREESTYLE LIBRE 3 READER) DEVI Use to check blood sugar continuously throughout the day. 1 each 0   Continuous Glucose Sensor  (FREESTYLE LIBRE 3 PLUS SENSOR) MISC Change sensor every 15 days. Use to check blood sugar continuously. 2 each 6   empagliflozin  (JARDIANCE ) 25 MG TABS tablet Take 1 tablet (25 mg total) by mouth daily before breakfast. 90 tablet 1   escitalopram  (LEXAPRO ) 10 MG tablet Take 1 tablet (10 mg total) by mouth daily. 30 tablet 1   famotidine  (PEPCID ) 40 MG tablet Take 1 tablet (40 mg total) by mouth daily. 30 tablet 3   fluticasone  (FLONASE ) 50 MCG/ACT nasal spray Place 2 sprays into both nostrils daily. 16 g 1   folic acid  (FOLVITE ) 1 MG tablet Take 1 tablet (1 mg total) by mouth daily. 30 tablet 4   gabapentin  (NEURONTIN ) 300 MG capsule Take 2 capsules (600 mg total) by mouth 3 (three) times daily. 180 capsule 1   glucose blood (ACCU-CHEK GUIDE TEST) test strip Use as instructed twice daily 100 each 12   glucose blood (ACCU-CHEK GUIDE) test strip Use to check blood sugar 3 times daily. E11.69 100 each 6   insulin  glargine (LANTUS  SOLOSTAR) 100 UNIT/ML Solostar Pen Inject 50 Units into the skin daily. 30 mL 11   Insulin  Pen Needle 32G X 4 MM MISC Use with Insulin  pen 100 each 3   methocarbamol  (ROBAXIN ) 500 MG tablet Take 1 tablet (500 mg total) by mouth every 6 (six) hours as needed for muscle spasms. 90 tablet 0   oxyCODONE  (OXYCONTIN ) 10 mg 12 hr tablet Take 1 tablet (10 mg total) by mouth every 12 (twelve) hours. 60 tablet 0   Oxycodone  HCl 10 MG TABS Take 1 tablet (10 mg total) by mouth every 6 (six) hours as needed. 60 tablet 0   sildenafil  (VIAGRA ) 100 MG tablet Take 0.5-1 tablets (50-100 mg total) by mouth daily as needed for erectile dysfunction. 5 tablet 11   Accu-Chek Softclix Lancets lancets Use to check blood sugar 3 times daily. E11.69 (Patient not taking: Reported on 05/06/2024) 100 each 6   doxycycline  (VIBRA -TABS) 100 MG tablet Take 1 tablet (100 mg total) by mouth 2 (two) times daily. (Patient not taking: Reported on 05/13/2024) 20 tablet 0   escitalopram  (LEXAPRO ) 5 MG tablet Patient  to take 1 tablet (5 mg) for 6 days before titrating to 10 mg daily (Patient not taking: Reported on 05/13/2024) 6 tablet 0   No current facility-administered medications for this visit.    SURGICAL HISTORY:  Past Surgical History:  Procedure Laterality Date   APPLICATION OF ROBOTIC ASSISTANCE FOR SPINAL PROCEDURE N/A 02/27/2024   Procedure: APPLICATION OF ROBOTIC ASSISTANCE FOR SPINAL PROCEDURE;  Surgeon: Lanis Pupa, MD;  Location: [facility] OR;  Service: Neurosurgery;  Laterality: N/A;   IR BONE TUMOR(S)RF ABLATION  08/01/2022   IR IMAGING GUIDED PORT INSERTION  09/21/2022   IR KYPHO LUMBAR INC FX REDUCE BONE BX UNI/BIL CANNULATION INC/IMAGING  08/01/2022   LAMINECTOMY WITH POSTERIOR LATERAL ARTHRODESIS LEVEL 4 N/A 02/27/2024   Procedure: Lumbar two LAMINECTOMY, Thoracic twelve-Lumbar four INSTRUMENTED STABILIZATION;  Surgeon: Lanis Pupa, MD;  Location: [facility] OR;  Service: Neurosurgery;  Laterality: N/A;    REVIEW OF SYSTEMS:   Constitutional: Positive for stable fatigue. Negative for  appetite change, chills,  fever and unexpected weight change.  HENT: Negative for mouth sores, nosebleeds, sore throat and trouble swallowing.   Eyes: Negative for eye problems and icterus.  Respiratory: Negative for cough, hemoptysis, shortness of breath and wheezing.   Cardiovascular: Negative for chest pain and leg swelling.  Gastrointestinal: Negative for abdominal pain, constipation, diarrhea, nausea and vomiting.  Genitourinary: Negative for bladder incontinence, difficulty urinating, dysuria, frequency and hematuria.   Musculoskeletal:  Positive for chronic back pain. Negative for gait problem, neck pain and neck stiffness.  Skin: Negative for itching and rash.  Neurological: Negative for dizziness, extremity weakness, gait problem, headaches, light-headedness and seizures.  Hematological: Negative for adenopathy. Does not bruise/bleed easily.  Psychiatric/Behavioral: Negative for confusion,  depression and sleep disturbance. The patient is not nervous/anxious.    PHYSICAL EXAMINATION:  Blood pressure 114/81, pulse 86, temperature 98 F (36.7 C), temperature source Temporal, resp. rate 13, weight 157 lb 12.8 oz (71.6 kg), SpO2 97%.  ECOG PERFORMANCE STATUS: 1  Physical Exam  Constitutional: Oriented to person, place, and time and well-developed, well-nourished, and in no distress.  HENT:  Head: Normocephalic and atraumatic.  Mouth/Throat: Oropharynx is clear and moist. No oropharyngeal exudate.  Eyes: Conjunctivae are normal. Right eye exhibits no discharge. Left eye exhibits no discharge. No scleral icterus.  Neck: Normal range of motion. Neck supple.  Cardiovascular: Normal rate, regular rhythm, normal heart sounds and intact distal pulses.   Pulmonary/Chest: Effort normal and breath sounds normal. No respiratory distress. No wheezes. No rales.  Abdominal: Soft. Bowel sounds are normal. Exhibits no distension and no mass. There is no tenderness.  Musculoskeletal: Normal range of motion. Exhibits no edema.  Lymphadenopathy:    No cervical adenopathy.  Neurological: Alert and oriented to person, place, and time. Exhibits normal muscle tone. Gait normal. Coordination normal.  Skin: Skin is warm and dry. No rash noted. Not diaphoretic. No erythema. No pallor.  Psychiatric: Mood, memory and judgment normal.  Vitals reviewed.  LABORATORY DATA: Lab Results  Component Value Date   WBC 10.8 (H) 05/13/2024   HGB 12.9 (L) 05/13/2024   HCT 38.4 (L) 05/13/2024   MCV 95.0 05/13/2024   PLT 236 05/13/2024      Chemistry      Component Value Date/Time   NA 134 (L) 04/24/2024 1042   NA 140 12/24/2020 1144   NA 142 08/02/2014 1649   K 4.1 04/24/2024 1042   K 4.2 08/02/2014 1649   CL 100 04/24/2024 1042   CL 109 (H) 08/02/2014 1649   CO2 29 04/24/2024 1042   CO2 21 08/02/2014 1649   BUN 16 04/24/2024 1042   BUN 7 12/24/2020 1144   BUN 11 08/02/2014 1649   CREATININE 0.66  04/24/2024 1042   CREATININE 0.92 08/02/2014 1649      Component Value Date/Time   CALCIUM  10.1 04/24/2024 1042   CALCIUM  8.6 08/02/2014 1649   ALKPHOS 91 04/24/2024 1042   ALKPHOS 82 08/02/2014 1649   AST 22 04/24/2024 1042   ALT 32 04/24/2024 1042   ALT 132 (H) 08/02/2014 1649   BILITOT 0.4 04/24/2024 1042       RADIOGRAPHIC STUDIES:  No results found.   ASSESSMENT/PLAN:  This is a very pleasant 58 year old African-American male diagnosed with stage IV (T3, N2, M1 C) non-small cell lung cancer, adenocarcinoma.  He presented with a right pulmonary nodule in addition to right hilar mediastinal lymphadenopathy and pleural effusion, metastatic disease to the brain, and extensive metastatic bone disease  to the lumbar spine.  He underwent biopsy and kyphoplasty to the L2 and to the brain metastasis.  He was diagnosed in September 2023.   His molecular studies showed no actionable mutations and his PD-L1 expression is 90%.  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    He underwent palliative radiation to L2 compression fracture as well as a solitary brain metastasis with SRS.    He is currently being treated with palliative systemic chemotherapy with carboplatin  for an AUC of 5, Alimta  500 mg per metered square, Keytruda  200 mg IV every 3 weeks.  He status post 29 cycles and he tolerated well without any concerning adverse side effects.  Starting from cycle #5 he started maintenance Alimta  and Keytruda .  IV every 3 weeks   Radiation oncology is monitoring his brain MRI and lumbar spine closely every few months. His next scan is due on 06/25/24   Labs were reviewed.  Recommend that he proceed with cycle #30 today as scheduled.   We will see him back for follow-up visit in 3 weeks for evaluation repeat blood work before undergoing cycle #31.     The patient was advised to call  immediately if he has any concerning symptoms in the interval. The patient voices understanding of current disease status and treatment options and is in agreement with the current care plan. All questions were answered. The patient knows to call the clinic with any problems, questions or concerns. We can certainly see the patient much sooner if necessary     No orders of the defined types were placed in this encounter.  The total time spent in the appointment was 20-29 minutes  Melinda Gwinner L Gerilyn Stargell, PA-C 05/13/24

## 2024-05-10 ENCOUNTER — Other Ambulatory Visit (HOSPITAL_COMMUNITY): Payer: Self-pay

## 2024-05-10 ENCOUNTER — Other Ambulatory Visit: Payer: Self-pay | Admitting: Nurse Practitioner

## 2024-05-10 MED ORDER — OXYCODONE HCL 10 MG PO TABS
10.0000 mg | ORAL_TABLET | Freq: Four times a day (QID) | ORAL | 0 refills | Status: DC | PRN
  Filled 2024-05-10: qty 60, 15d supply, fill #0

## 2024-05-13 ENCOUNTER — Inpatient Hospital Stay (HOSPITAL_BASED_OUTPATIENT_CLINIC_OR_DEPARTMENT_OTHER): Admitting: Physician Assistant

## 2024-05-13 ENCOUNTER — Inpatient Hospital Stay

## 2024-05-13 VITALS — BP 114/81 | HR 86 | Temp 98.0°F | Resp 13 | Wt 157.8 lb

## 2024-05-13 DIAGNOSIS — C7931 Secondary malignant neoplasm of brain: Secondary | ICD-10-CM

## 2024-05-13 DIAGNOSIS — Z5112 Encounter for antineoplastic immunotherapy: Secondary | ICD-10-CM | POA: Diagnosis not present

## 2024-05-13 DIAGNOSIS — C3490 Malignant neoplasm of unspecified part of unspecified bronchus or lung: Secondary | ICD-10-CM

## 2024-05-13 DIAGNOSIS — Z5111 Encounter for antineoplastic chemotherapy: Secondary | ICD-10-CM | POA: Diagnosis not present

## 2024-05-13 LAB — CBC WITH DIFFERENTIAL (CANCER CENTER ONLY)
Abs Immature Granulocytes: 0.06 10*3/uL (ref 0.00–0.07)
Basophils Absolute: 0 10*3/uL (ref 0.0–0.1)
Basophils Relative: 0 %
Eosinophils Absolute: 0 10*3/uL (ref 0.0–0.5)
Eosinophils Relative: 0 %
HCT: 38.4 % — ABNORMAL LOW (ref 39.0–52.0)
Hemoglobin: 12.9 g/dL — ABNORMAL LOW (ref 13.0–17.0)
Immature Granulocytes: 1 %
Lymphocytes Relative: 6 %
Lymphs Abs: 0.6 10*3/uL — ABNORMAL LOW (ref 0.7–4.0)
MCH: 31.9 pg (ref 26.0–34.0)
MCHC: 33.6 g/dL (ref 30.0–36.0)
MCV: 95 fL (ref 80.0–100.0)
Monocytes Absolute: 0.4 10*3/uL (ref 0.1–1.0)
Monocytes Relative: 4 %
Neutro Abs: 9.7 10*3/uL — ABNORMAL HIGH (ref 1.7–7.7)
Neutrophils Relative %: 89 %
Platelet Count: 236 10*3/uL (ref 150–400)
RBC: 4.04 MIL/uL — ABNORMAL LOW (ref 4.22–5.81)
RDW: 13.3 % (ref 11.5–15.5)
WBC Count: 10.8 10*3/uL — ABNORMAL HIGH (ref 4.0–10.5)
nRBC: 0 % (ref 0.0–0.2)

## 2024-05-13 LAB — CMP (CANCER CENTER ONLY)
ALT: 30 U/L (ref 0–44)
AST: 28 U/L (ref 15–41)
Albumin: 3.7 g/dL (ref 3.5–5.0)
Alkaline Phosphatase: 84 U/L (ref 38–126)
Anion gap: 6 (ref 5–15)
BUN: 15 mg/dL (ref 6–20)
CO2: 29 mmol/L (ref 22–32)
Calcium: 8.7 mg/dL — ABNORMAL LOW (ref 8.9–10.3)
Chloride: 103 mmol/L (ref 98–111)
Creatinine: 0.6 mg/dL — ABNORMAL LOW (ref 0.61–1.24)
GFR, Estimated: >60 mL/min (ref >60)
Glucose, Bld: 218 mg/dL — ABNORMAL HIGH (ref 70–99)
Potassium: 3.8 mmol/L (ref 3.5–5.1)
Sodium: 138 mmol/L (ref 135–145)
Total Bilirubin: 0.4 mg/dL (ref 0.0–1.2)
Total Protein: 7.3 g/dL (ref 6.5–8.1)

## 2024-05-13 LAB — TSH: TSH: 0.536 u[IU]/mL (ref 0.350–4.500)

## 2024-05-13 MED ORDER — CYANOCOBALAMIN 1000 MCG/ML IJ SOLN
1000.0000 ug | Freq: Once | INTRAMUSCULAR | Status: AC
  Administered 2024-05-13: 1000 ug via INTRAMUSCULAR
  Filled 2024-05-13: qty 1

## 2024-05-13 MED ORDER — HEPARIN SOD (PORK) LOCK FLUSH 100 UNIT/ML IV SOLN
500.0000 [IU] | Freq: Once | INTRAVENOUS | Status: AC | PRN
  Administered 2024-05-13: 500 [IU]

## 2024-05-13 MED ORDER — PROCHLORPERAZINE MALEATE 10 MG PO TABS
10.0000 mg | ORAL_TABLET | Freq: Once | ORAL | Status: AC
  Administered 2024-05-13: 10 mg via ORAL
  Filled 2024-05-13: qty 1

## 2024-05-13 MED ORDER — SODIUM CHLORIDE 0.9 % IV SOLN
200.0000 mg | Freq: Once | INTRAVENOUS | Status: AC
  Administered 2024-05-13: 200 mg via INTRAVENOUS
  Filled 2024-05-13: qty 200

## 2024-05-13 MED ORDER — SODIUM CHLORIDE 0.9 % IV SOLN: Freq: Once | INTRAVENOUS | Status: AC

## 2024-05-13 MED ORDER — SODIUM CHLORIDE 0.9 % IV SOLN
500.0000 mg/m2 | Freq: Once | INTRAVENOUS | Status: AC
  Administered 2024-05-13: 900 mg via INTRAVENOUS
  Filled 2024-05-13: qty 20

## 2024-05-13 MED ORDER — SODIUM CHLORIDE 0.9% FLUSH
10.0000 mL | Freq: Once | INTRAVENOUS | Status: AC
  Administered 2024-05-13: 10 mL

## 2024-05-13 MED ORDER — SODIUM CHLORIDE 0.9% FLUSH
10.0000 mL | INTRAVENOUS | Status: DC | PRN
  Administered 2024-05-13: 10 mL

## 2024-05-13 NOTE — Patient Instructions (Signed)
 [facility] - A DEPT OF [facility]  Discharge Instructions: Thank you for choosing [facility] to provide your oncology and hematology care.   If you have a lab appointment with the Cancer Center, please go directly to the Cancer Center and check in at the registration area.   Wear comfortable clothing and clothing appropriate for easy access to any Portacath or PICC line.   We strive to give you quality time with your provider. You may need to reschedule your appointment if you arrive late (15 or more minutes).  Arriving late affects you and other patients whose appointments are after yours.  Also, if you miss three or more appointments without notifying the office, you may be dismissed from the clinic at the provider's discretion.      For prescription refill requests, have your pharmacy contact our office and allow 72 hours for refills to be completed.    Today you received the following chemotherapy and/or immunotherapy agents: Keytruda/Alimta      To help prevent nausea and vomiting after your treatment, we encourage you to take your nausea medication as directed.  BELOW ARE SYMPTOMS THAT SHOULD BE REPORTED IMMEDIATELY: *FEVER GREATER THAN 100.4 F (38 C) OR HIGHER *CHILLS OR SWEATING *NAUSEA AND VOMITING THAT IS NOT CONTROLLED WITH YOUR NAUSEA MEDICATION *UNUSUAL SHORTNESS OF BREATH *UNUSUAL BRUISING OR BLEEDING *URINARY PROBLEMS (pain or burning when urinating, or frequent urination) *BOWEL PROBLEMS (unusual diarrhea, constipation, pain near the anus) TENDERNESS IN MOUTH AND THROAT WITH OR WITHOUT PRESENCE OF ULCERS (sore throat, sores in mouth, or a toothache) UNUSUAL RASH, SWELLING OR PAIN  UNUSUAL VAGINAL DISCHARGE OR ITCHING   Items with * indicate a potential emergency and should be followed up as soon as possible or go to the Emergency Department if any problems should occur.  Please show the CHEMOTHERAPY ALERT CARD or  IMMUNOTHERAPY ALERT CARD at check-in to the Emergency Department and triage nurse.  Should you have questions after your visit or need to cancel or reschedule your appointment, please contact [facility] - A DEPT OF [facility]  Dept: [phone]  and follow the prompts.  Office hours are 8:00 a.m. to 4:30 p.m. Monday - Friday. Please note that voicemails left after 4:00 p.m. may not be returned until the following business day.  We are closed weekends and major holidays. You have access to a nurse at all times for urgent questions. Please call the main number to the clinic Dept: [phone] and follow the prompts.   For any non-urgent questions, you may also contact your provider using MyChart. We now offer e-Visits for anyone 6 and older to request care online for non-urgent symptoms. For details visit [email or web address].   Also download the MyChart app! Go to the app store, search "MyChart", open the app, select [place], and log in with your MyChart username and password.

## 2024-05-14 LAB — T4: T4, Total: 7.6 ug/dL (ref 4.5–12.0)

## 2024-05-16 ENCOUNTER — Encounter: Payer: Self-pay | Admitting: Pharmacist

## 2024-05-16 ENCOUNTER — Other Ambulatory Visit (HOSPITAL_COMMUNITY): Payer: Self-pay

## 2024-05-16 ENCOUNTER — Ambulatory Visit: Attending: Family Medicine | Admitting: Pharmacist

## 2024-05-16 DIAGNOSIS — Z7984 Long term (current) use of oral hypoglycemic drugs: Secondary | ICD-10-CM

## 2024-05-16 DIAGNOSIS — E1142 Type 2 diabetes mellitus with diabetic polyneuropathy: Secondary | ICD-10-CM

## 2024-05-16 DIAGNOSIS — Z794 Long term (current) use of insulin: Secondary | ICD-10-CM | POA: Diagnosis not present

## 2024-05-16 MED ORDER — LANTUS SOLOSTAR 100 UNIT/ML ~~LOC~~ SOPN
PEN_INJECTOR | SUBCUTANEOUS | 1 refills | Status: DC
  Filled 2024-05-16: qty 39, 88d supply, fill #0

## 2024-05-16 NOTE — Progress Notes (Signed)
 S:     No chief complaint on file.  58 y.o. male who presents for diabetes evaluation, education, and management. PMH is significant for stage IV NSCLC (currently undergoing palliative systemic chemotherapy w/ Dr. Sherrod), T2DM w/ hx of microalbuminuria & retinopathy, HLD, GAD/MDD, GERD, .   Patient was referred and last seen by Primary Care Provider, Dr. Delbert, on 05/06/2024.  She originally saw and referred to me on 01/18/24. At that visit, A1c was up to 10.5 (from 8.4 prior). His Lantus  was resumed at 50u daily. I saw him in follow-up on 02/15/2024. At that visit with me, he endorsed hypoglycemia with Lantus  50u daily and self-decreased to 40u daily. He requested to split this and take 20u BID and we made this change. Additionally, we submitted a PA for CGM supplies and got approval. Confirmed via pharmacy records that patient picked up and started using CGM on 02/22/2024.  Subsequently, I saw him 03/19/2024. Noted that pre-surgery A1c was 8.7 (02/27/2024) before undergoing successful decompression/L2 laminectomy and stabilization T12-L4. When I saw him, he was in good spirits. He was already back in the gym with machine exercises at that time. He reported adherence to BID Lantus . Hypoglycemia had improved since taking 20u BID.  With Dr. Newlin on 05/06/2024, he reported taking Lantus  as 26 qAM and 14 qPM. His CGM revealed sugar avg of 172 mg/dL. Of note, he was written an antibiotics rxn for sinusitis. Tells me today he has completed that and his URI-related symptoms have resolved. He is prone to hypoglycemia. He is currently receiving palliative chemotherapy and eats irregularly. Due to this, his sugar management is complicated.  Today, he brings in his CGM for review and his sugars have been elevated recently secondary to infection. No issues with hypoglycemia since seeing Dr. Newlin 05/06/2024. Of note, he tells me when he does experience hypoglycemia it occurs overnight.   Current diabetes  medications include: Jardiance  25mg  daily, Lantus  26 units in the morning/14 units at night  Patient reports adherence to taking all medications as prescribed.   Insurance coverage: Medicaid  Patient denies hypoglycemia since visit with Dr. Newlin last week.   Patient denies nocturia (nighttime urination).  Patient reports improvement in LE neuropathy (nerve pain). Patient denies visual changes.  Patient reports self foot exams.   Patient reported dietary habits: Eats 3-4 meals/day Eats pretty healthy most of the time Has cookies and chips Appetite has returned  Patient-reported exercise habits: -3-4 days 1.5 hours at a time  O:  Date of Download: 05/16/2024 Average Glucose: 30-day avg: 218 mg/dL, 7-day avg: 758 Glucose Management Indicator: n/a. Viewing Libre 3 receiver.   Glucose Variability: n/a. Viewing Libre 3 receiver.  (goal <36%) Time in Goal (30 days):  - Time in range 70-180: 35% - Time above range: 64% - Time below range: 1% Observed patterns: -Spikes and higher avg occurs between 12p-3p.  Lab Results  Component Value Date   HGBA1C 8.7 (H) 02/27/2024   There were no vitals filed for this visit.  Lipid Panel     Component Value Date/Time   CHOL 133 07/12/2023 1116   TRIG 64 07/12/2023 1116   HDL 41 07/12/2023 1116   CHOLHDL 3.2 07/12/2023 1116   CHOLHDL 2.4 03/11/2021 1843   VLDL 23 03/11/2021 1843   LDLCALC 79 07/12/2023 1116    Clinical Atherosclerotic Cardiovascular Disease (ASCVD): Yes  The ASCVD Risk score (Arnett DK, et al., 2019) failed to calculate for the following reasons:   Risk score  cannot be calculated because patient has a medical history suggesting prior/existing ASCVD   A/P: Diabetes longstanding currently above goal with A1c. CGM report reveals continued hyperglycemia in the setting of recent URI. His readings are higher during the day. Hypoglycemia has improved since I saw him and he is able to verbalize appropriate hypoglycemia  management plan. CGM has really helped him.  -Increase AM dose of Lantus  to 30 units daily.  -Continue PM dose of 14 units daily.  -Continued SGLT2-I Jardiance  (empagliflozin ) 10 mg. Counseled on sick day rules. -Extensively discussed pathophysiology of diabetes, recommended lifestyle interventions, dietary effects on blood sugar control.  -Counseled on s/sx of and management of hypoglycemia.  -Next A1c anticipated 05/2024.  Written patient instructions provided. Patient verbalized understanding of treatment plan.  Total time in face to face counseling 30 minutes.    Follow-up:  Pharmacist 1 month.  Herlene Fleeta Morris, PharmD, JAQUELINE, CPP Clinical Pharmacist [facility] [phone]

## 2024-05-19 ENCOUNTER — Other Ambulatory Visit: Payer: Self-pay | Admitting: Nurse Practitioner

## 2024-05-19 DIAGNOSIS — G893 Neoplasm related pain (acute) (chronic): Secondary | ICD-10-CM

## 2024-05-19 DIAGNOSIS — C3490 Malignant neoplasm of unspecified part of unspecified bronchus or lung: Secondary | ICD-10-CM

## 2024-05-19 DIAGNOSIS — Z515 Encounter for palliative care: Secondary | ICD-10-CM

## 2024-05-20 ENCOUNTER — Other Ambulatory Visit (HOSPITAL_COMMUNITY): Payer: Self-pay

## 2024-05-20 ENCOUNTER — Encounter: Payer: Self-pay | Admitting: Internal Medicine

## 2024-05-20 MED ORDER — OXYCODONE HCL ER 10 MG PO T12A
10.0000 mg | EXTENDED_RELEASE_TABLET | Freq: Two times a day (BID) | ORAL | 0 refills | Status: DC
  Filled 2024-05-20 – 2024-05-23 (×2): qty 60, 30d supply, fill #0

## 2024-05-21 ENCOUNTER — Encounter: Payer: Self-pay | Admitting: Internal Medicine

## 2024-05-21 ENCOUNTER — Other Ambulatory Visit: Payer: Self-pay

## 2024-05-21 ENCOUNTER — Ambulatory Visit (HOSPITAL_COMMUNITY): Admitting: Licensed Clinical Social Worker

## 2024-05-21 ENCOUNTER — Other Ambulatory Visit (HOSPITAL_COMMUNITY): Payer: Self-pay

## 2024-05-23 ENCOUNTER — Other Ambulatory Visit: Payer: Self-pay

## 2024-05-23 ENCOUNTER — Other Ambulatory Visit (HOSPITAL_COMMUNITY): Payer: Self-pay

## 2024-05-30 ENCOUNTER — Other Ambulatory Visit: Payer: Self-pay | Admitting: Nurse Practitioner

## 2024-05-30 ENCOUNTER — Other Ambulatory Visit (HOSPITAL_COMMUNITY): Payer: Self-pay

## 2024-05-30 MED ORDER — OXYCODONE HCL 10 MG PO TABS
10.0000 mg | ORAL_TABLET | Freq: Four times a day (QID) | ORAL | 0 refills | Status: DC | PRN
  Filled 2024-05-30: qty 60, 15d supply, fill #0

## 2024-05-31 ENCOUNTER — Ambulatory Visit (INDEPENDENT_AMBULATORY_CARE_PROVIDER_SITE_OTHER): Admitting: Licensed Clinical Social Worker

## 2024-05-31 ENCOUNTER — Other Ambulatory Visit (HOSPITAL_COMMUNITY): Payer: Self-pay

## 2024-05-31 DIAGNOSIS — F322 Major depressive disorder, single episode, severe without psychotic features: Secondary | ICD-10-CM

## 2024-05-31 DIAGNOSIS — F4310 Post-traumatic stress disorder, unspecified: Secondary | ICD-10-CM

## 2024-05-31 DIAGNOSIS — F411 Generalized anxiety disorder: Secondary | ICD-10-CM

## 2024-05-31 NOTE — Progress Notes (Signed)
 THERAPIST PROGRESS NOTE  Virtual Visit via Video Note  I connected with Brayn L Hackenberg II on 05/31/24 at  9:00 AM EDT by a video enabled telemedicine application and verified that I am speaking with the correct person using two identifiers.  Location: Patient: [facility]  Provider: Providers Home    I discussed the limitations of evaluation and management by telemedicine and the availability of in person appointments. The patient expressed understanding and agreed to proceed.      I discussed the assessment and treatment plan with the patient. The patient was provided an opportunity to ask questions and all were answered. The patient agreed with the plan and demonstrated an understanding of the instructions.   The patient was advised to call back or seek an in-person evaluation if the symptoms worsen or if the condition fails to improve as anticipated.  I provided 45 minutes of non-face-to-face time during this encounter.   Juliene GORMAN Patee, LCSW   Participation Level: Active  Behavioral Response: CasualAlertDepressed  Type of Therapy: Individual Therapy  Treatment Goals addressed:  Active     OP Depression     LTG: Reduce frequency, intensity, and duration of depression symptoms so that daily functioning is improved (Progressing)     Start:  04/23/24    Expected End:  10/18/24         LTG: Increase coping skills to manage depression and improve ability to perform daily activities (Progressing)     Start:  04/23/24    Expected End:  10/18/24         LTG: Lamar will score less than 5 on the Patient Health Questionnaire (PHQ-9)  (Progressing)     Start:  04/23/24    Expected End:  10/18/24         Encourage Lamar to participate in recovery peer support activities weekly      Start:  04/23/24         Work with Lamar to track symptoms, triggers, and/or skill use through a mood chart, diary card, or journal (Completed)     Start:  04/23/24    End:   05/31/24      Provide Lamar educational information and reading material on dissociation, its causes, and symptoms (Completed)     Start:  04/23/24    End:  05/31/24      Therapist will educate patient on cognitive distortions and the rationale for treatment of depression (Completed)     Start:  04/23/24    End:  05/31/24        ProgressTowards Goals: Progressing  Interventions: CBT, Motivational Interviewing, and Supportive   Suicidal/Homicidal: Nowithout intent/plan  Therapist Response:    Shaunte was alert and oriented x 5.  He was pleasant, cooperative, maintained good eye contact.  Patient engaged well in therapy session and was dressed casually.  He presented with anxious and depressed mood\affect.  Patient comes in stating depressive episode that lasted for 2 weeks.  He reports decrease motivation, worthlessness, hopelessness, tension, worry symptoms.  Patient states that he was in a anxiety cycle.  Chloe was trying to figure out the route because of his depression and anxiety but could not figure it out.  Donell states in the past couple days things have improved and he has been getting out of the house more.  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  Torin states that he has been taking his  medications as prescribed and consistently 7 out of 7 days/week.  Shayan denies any side effects at this time.  Intervention/plan: LCSW educated patient on the signs and symptoms of depressive episodes.  LCSW educated patient on the use of motivational interviewing for positive affirmations to decrease depressive symptoms.  LCSW utilized supportive therapy for praise and encouragement.  LCSW administered a PHQ-9.  LCSW administered GAD-7.  LCSW reviewed GAD-7 and PHQ-9 with the patient.  LCSW utilized psychoanalytic therapy for patient to express thoughts, feelings and concerns and nonjudgmental environment.  Plan for  patient will be to utilize positive affirmation 1 time daily either in the morning or at night prior to bedtime.  Plan: Return again in 4 weeks.  Diagnosis: GAD (generalized anxiety disorder)  Major depressive disorder, severe (HCC)  PTSD (post-traumatic stress disorder)  Collaboration of Care: Other none today  Patient/Guardian was advised Release of Information must be obtained prior to any record release in order to collaborate their care with an outside provider. Patient/Guardian was advised if they have not already done so to contact the registration department to sign all necessary forms in order for us  to release information regarding their care.   Consent: Patient/Guardian gives verbal consent for treatment and assignment of benefits for services provided during this visit. Patient/Guardian expressed understanding and agreed to proceed.   Juliene GORMAN Patee, LCSW 05/31/2024

## 2024-06-03 ENCOUNTER — Telehealth: Payer: Self-pay | Admitting: Physician Assistant

## 2024-06-03 ENCOUNTER — Inpatient Hospital Stay: Attending: Adult Health | Admitting: Physician Assistant

## 2024-06-03 DIAGNOSIS — E119 Type 2 diabetes mellitus without complications: Secondary | ICD-10-CM | POA: Diagnosis not present

## 2024-06-03 DIAGNOSIS — C7951 Secondary malignant neoplasm of bone: Secondary | ICD-10-CM | POA: Insufficient documentation

## 2024-06-03 DIAGNOSIS — J90 Pleural effusion, not elsewhere classified: Secondary | ICD-10-CM | POA: Diagnosis not present

## 2024-06-03 DIAGNOSIS — Z5112 Encounter for antineoplastic immunotherapy: Secondary | ICD-10-CM | POA: Diagnosis present

## 2024-06-03 DIAGNOSIS — F1020 Alcohol dependence, uncomplicated: Secondary | ICD-10-CM | POA: Insufficient documentation

## 2024-06-03 DIAGNOSIS — C7931 Secondary malignant neoplasm of brain: Secondary | ICD-10-CM | POA: Insufficient documentation

## 2024-06-03 DIAGNOSIS — Z7984 Long term (current) use of oral hypoglycemic drugs: Secondary | ICD-10-CM | POA: Diagnosis not present

## 2024-06-03 DIAGNOSIS — Z923 Personal history of irradiation: Secondary | ICD-10-CM | POA: Diagnosis not present

## 2024-06-03 DIAGNOSIS — C3411 Malignant neoplasm of upper lobe, right bronchus or lung: Secondary | ICD-10-CM | POA: Insufficient documentation

## 2024-06-03 DIAGNOSIS — Z79899 Other long term (current) drug therapy: Secondary | ICD-10-CM | POA: Insufficient documentation

## 2024-06-03 DIAGNOSIS — C3490 Malignant neoplasm of unspecified part of unspecified bronchus or lung: Secondary | ICD-10-CM

## 2024-06-03 NOTE — Progress Notes (Signed)
 [facility] HEMATOLOGY-ONCOLOGY TeleHEALTH VISIT PROGRESS NOTE   I connected with Samuel [name] on 06/03/24 at  4:00 PM EDT by telephone and verified that I am speaking with Samuel correct person using two identifiers.  I discussed Samuel limitations, risks, security and privacy concerns of performing an evaluation and management service by telemedicine and Samuel availability of in-person appointments. I also discussed with Samuel [name] that there may be a [name] responsible charge related to this service. Samuel [name] expressed understanding and agreed to proceed.  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    Biomarker Findings Microsatellite status - MS-Stable Tumor Mutational Burden - 8 Muts/Mb Genomic Findings For a complete list of Samuel genes assayed, please refer to Samuel Appendix. KEAP1 Q227* CDKN2A/B [id number] G89V PRKCI amplification TERC amplification - equivocal? TP53 D260fs*18 8 Disease relevant genes with no reportable alterations: ALK, BRAF, EGFR, ERBB2, KRAS, MET, RET, ROS1   PDL1 TPS  90%  PRIOR THERAPY: 1) Status post palliative radiotherapy to Samuel L2 lesion under Samuel care of Dr. Patrcia. 2) Status post SRS to solitary brain metastasis under Samuel care of Dr. Patrcia 3) status post L2 laminectomy for decompression of Samuel thecal sac under Samuel care of Dr. Lanis on 02/27/2024.   CURRENT THERAPY: Palliative systemic therapy  with carboplatin  for AUC of 5, Alimta  500 Mg/M2 and Keytruda  200 Mg IV every 3 weeks. First dose September 05, 2022. Status post 30 cycles. Starting from cycle #5, he started on maintenance keytruda  and alimta  IV every 3 weeks.   INTERVAL HISTORY: Samuel [name] [name] 58 y.o. male and I connected via telephone encounter today.  Samuel [name] was last seen in Samuel clinic 3 weeks ago by myself.  Samuel [name] is feeling well.  At his last appointment he was getting over a cold which Samuel [name] states is better at this time.  He denies any fever or chills.  His appetite comes and goes.  For allergies he uses Flonase  and Zyrtec .  He denies any significant shortness of breath and his cough is improved.  He denies any chest pain or hemoptysis.  He denies any headaches.  He is getting new glasses currently due to visual changes.  He denies any nausea, vomiting, diarrhea, or constipation.  His purpose of his visit is for toxicity check prior to undergoing treatment tomorrow as planned with cycle #31.    MEDICAL HISTORY: Past Medical History:  Diagnosis Date   Alcohol  use disorder, severe, dependence (HCC) 12/26/2016   Anxiety    Depression    Diabetes mellitus without complication (HCC)    Metastatic cancer to brain (HCC) 08/05/2022   Metastatic cancer to spine (HCC) 07/19/2022   Pancreatitis unk   Primary lung adenocarcinoma (HCC) 08/05/2022   Suicidal ideation    Type 2 diabetes mellitus with hyperglycemia, without long-term current use of insulin  (HCC) 06/13/2022   Formatting of this note might be different from Samuel original. 06/13/2022 A1C 13.8, FSBG 414 Start empagliflozin  5mg /metformin  1000 daily, levemir  20 qhs. (Samples given) Will apply for medassist for pharmacy    ALLERGIES:  is allergic to penicillins and percocet [oxycodone -acetaminophen ].  MEDICATIONS:  Current Outpatient Medications  Medication Sig Dispense Refill   Accu-Chek Softclix Lancets lancets Use to check blood sugar 3 times daily.  E11.69 ([name] not taking: Reported on 05/06/2024) 100 each 6   acetaminophen  (TYLENOL ) 500 MG tablet Take 1,000 mg by mouth every 6 (six) hours as needed for moderate pain (pain score 4-6).     atorvastatin  (LIPITOR) 40 MG tablet Take 1 tablet (40 mg total) by mouth daily. 90 tablet 1   buPROPion  (WELLBUTRIN  XL) 150 MG 24 hr tablet Take 1 tablet (150 mg total) by mouth daily. 90 tablet 1   cetirizine  (ZYRTEC ) 10 MG tablet Take 1 tablet (10 mg total) by mouth daily. 30 tablet 1   Continuous Glucose Receiver (FREESTYLE LIBRE 3 READER) DEVI Use to check blood sugar continuously throughout Samuel day. 1 each 0   Continuous Glucose Sensor (FREESTYLE LIBRE 3 PLUS SENSOR) MISC Change sensor every 15 days. Use to check blood sugar continuously. 2 each 6   doxycycline  (VIBRA -TABS) 100 MG tablet Take 1 tablet (100 mg total) by mouth 2 (two) times daily. ([name] not taking: Reported on 05/13/2024) 20 tablet 0   empagliflozin  (JARDIANCE ) 25 MG TABS tablet Take 1 tablet (25 mg total) by mouth daily before breakfast. 90 tablet 1   escitalopram  (LEXAPRO ) 10 MG tablet Take 1 tablet (10 mg total) by mouth daily. 30 tablet 1   escitalopram  (LEXAPRO ) 5 MG tablet [name] to take 1 tablet (5 mg) for 6 days before titrating to 10 mg daily ([name] not taking: Reported on 05/13/2024) 6 tablet 0   famotidine  (PEPCID ) 40 MG tablet Take 1 tablet (40 mg total) by mouth daily. 30 tablet 3   fluticasone  (FLONASE ) 50 MCG/ACT nasal spray Place 2 sprays into both nostrils daily. 16 g 1   folic acid  (FOLVITE ) 1 MG tablet Take 1 tablet (1 mg total) by mouth daily. 30 tablet 4   gabapentin  (NEURONTIN ) 300 MG capsule Take 2 capsules (600 mg total) by mouth 3 (three) times daily. 180 capsule 1   glucose blood (ACCU-CHEK GUIDE TEST) test strip Use as instructed twice daily 100 each 12   glucose blood (ACCU-CHEK GUIDE) test strip Use to check blood sugar 3 times daily. E11.69 100 each 6   insulin  glargine (LANTUS  SOLOSTAR) 100 UNIT/ML  Solostar Pen Inject 30 units under Samuel skin in Samuel morning and 14 units in Samuel evening. 45 mL 1   Insulin  Pen Needle 32G X 4 MM MISC Use with Insulin  pen 100 each 3   methocarbamol  (ROBAXIN ) 500 MG tablet Take 1 tablet (500 mg total) by mouth every 6 (six) hours as needed for muscle spasms. 90 tablet 0   oxyCODONE  (OXYCONTIN ) 10 mg 12 hr tablet Take 1 tablet (10 mg total) by mouth every 12 (twelve) hours. 60 tablet 0   Oxycodone  HCl 10 MG TABS Take 1 tablet (10 mg total) by mouth every 6 (six) hours as needed. 60 tablet 0   sildenafil  (VIAGRA ) 100 MG tablet Take 0.5-1 tablets (50-100 mg total) by mouth daily as needed for erectile dysfunction. 5 tablet 11   No current facility-administered medications for this visit.    SURGICAL HISTORY:  Past Surgical History:  Procedure Laterality Date   APPLICATION OF ROBOTIC ASSISTANCE FOR SPINAL PROCEDURE N/A 02/27/2024   Procedure: APPLICATION OF ROBOTIC ASSISTANCE FOR SPINAL PROCEDURE;  Surgeon: Lanis Pupa,  MD;  Location: [facility] OR;  Service: Neurosurgery;  Laterality: N/A;   IR BONE TUMOR(S)RF ABLATION  08/01/2022   IR IMAGING GUIDED PORT INSERTION  09/21/2022   IR KYPHO LUMBAR INC FX REDUCE BONE BX UNI/BIL CANNULATION INC/IMAGING  08/01/2022   LAMINECTOMY WITH POSTERIOR LATERAL ARTHRODESIS LEVEL 4 N/A 02/27/2024   Procedure: Lumbar two LAMINECTOMY, Thoracic twelve-Lumbar four INSTRUMENTED STABILIZATION;  Surgeon: Lanis Pupa, MD;  Location: [facility] OR;  Service: Neurosurgery;  Laterality: N/A;    REVIEW OF SYSTEMS:   Constitutional: Positive for stable fatigue and occasional diminished appetite. Negative for appetite change, chills,  fever and unexpected weight change.  HENT: Negative for mouth sores, nosebleeds, sore throat and trouble swallowing.   Eyes: Negative for eye problems and icterus.  Respiratory: Negative for cough, hemoptysis, shortness of breath and wheezing.   Cardiovascular: Negative for chest pain and leg swelling.   Gastrointestinal: Negative for abdominal pain, constipation, diarrhea, nausea and vomiting.  Genitourinary: Negative for bladder incontinence, difficulty urinating, dysuria, frequency and hematuria.   Musculoskeletal:  Positive for chronic back pain. Negative for gait problem, neck pain and neck stiffness.  Skin: Negative for itching and rash.  Neurological: Negative for dizziness, extremity weakness, gait problem, headaches, light-headedness and seizures.  Hematological: Negative for adenopathy. Does not bruise/bleed easily.  Psychiatric/Behavioral: Negative for confusion, depression and sleep disturbance. Samuel [name] is not nervous/anxious.    PHYSICAL EXAMINATION:  There were no vitals taken for this visit.  ECOG PERFORMANCE STATUS: 1  Physical Exam  Constitutional: Oriented to person, place, and time  Psychiatric: Mood, memory and judgment normal.  Vitals reviewed.  LABORATORY DATA: Lab Results  Component Value Date   WBC 10.8 (H) 05/13/2024   HGB 12.9 (L) 05/13/2024   HCT 38.4 (L) 05/13/2024   MCV 95.0 05/13/2024   PLT 236 05/13/2024      Chemistry      Component Value Date/Time   NA 138 05/13/2024 1112   NA 140 12/24/2020 1144   NA 142 08/02/2014 1649   K 3.8 05/13/2024 1112   K 4.2 08/02/2014 1649   CL 103 05/13/2024 1112   CL 109 (H) 08/02/2014 1649   CO2 29 05/13/2024 1112   CO2 21 08/02/2014 1649   BUN 15 05/13/2024 1112   BUN 7 12/24/2020 1144   BUN 11 08/02/2014 1649   CREATININE 0.60 (L) 05/13/2024 1112   CREATININE 0.92 08/02/2014 1649      Component Value Date/Time   CALCIUM  8.7 (L) 05/13/2024 1112   CALCIUM  8.6 08/02/2014 1649   ALKPHOS 84 05/13/2024 1112   ALKPHOS 82 08/02/2014 1649   AST 28 05/13/2024 1112   ALT 30 05/13/2024 1112   ALT 132 (H) 08/02/2014 1649   BILITOT 0.4 05/13/2024 1112       RADIOGRAPHIC STUDIES:  No results found.   ASSESSMENT/PLAN:  This is a very pleasant 58 year old African-American male diagnosed with  stage IV (T3, N2, M1 C) non-small cell lung cancer, adenocarcinoma.  He presented with a right pulmonary nodule in addition to right hilar mediastinal lymphadenopathy and pleural effusion, metastatic disease to Samuel brain, and extensive metastatic bone disease to Samuel lumbar spine.  He underwent biopsy and kyphoplasty to Samuel L2 and to Samuel brain metastasis.  He was diagnosed in September 2023.   His molecular studies showed no actionable mutations and his PD-L1 expression is 90%.  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    He underwent palliative radiation to L2 compression fracture as well as a solitary brain metastasis with SRS.    He is currently being treated with palliative systemic chemotherapy with carboplatin  for an AUC of 5, Alimta  500 mg per metered square, Keytruda  200 mg IV every 3 weeks.  He status post 30 cycles and he tolerated well without any concerning adverse side effects.  Starting from cycle #5 he started maintenance Alimta  and Keytruda .  IV every 3 weeks   Radiation oncology is monitoring his brain MRI and lumbar spine closely every few months. His next scan is due on 06/25/24   As long as his labs are in parameters tomorrow he will proceed with cycle #31 as planned.   We will see him back for follow-up visit in 3 weeks for evaluation repeat blood work before undergoing cycle #32.    I will arrange for restaging CT scan of Samuel chest, abdomen, and pelvis prior to his next cycle of treatment in August  I discussed Samuel assessment and treatment plan with Samuel [name]. Samuel [name] was provided an opportunity to ask questions and all were answered. Samuel [name] agreed with Samuel plan and demonstrated an understanding of Samuel instructions.  Samuel [name] was advised to call back or seek an in-person evaluation if Samuel symptoms worsen or if Samuel condition fails to improve as  anticipated.  I provided 20-29 minutes of non face-to-face telephone visit time during this encounter, and > 50% was spent counseling as documented under my assessment & plan.  Elisa Kutner L Lisl Slingerland, PA-C 06/03/2024 11:09 AM  No orders of Samuel defined types were placed in this encounter.    Fergus Throne L Oneal Schoenberger, PA-C 06/03/24

## 2024-06-03 NOTE — Telephone Encounter (Signed)
 Called the patient to change appointments. The patient is aware of the changes made and confirmed details.

## 2024-06-04 ENCOUNTER — Inpatient Hospital Stay

## 2024-06-04 ENCOUNTER — Encounter: Payer: Self-pay | Admitting: Nurse Practitioner

## 2024-06-04 ENCOUNTER — Inpatient Hospital Stay (HOSPITAL_BASED_OUTPATIENT_CLINIC_OR_DEPARTMENT_OTHER): Admitting: Nurse Practitioner

## 2024-06-04 ENCOUNTER — Inpatient Hospital Stay: Admitting: Internal Medicine

## 2024-06-04 VITALS — BP 120/81 | HR 85 | Temp 98.1°F | Resp 17 | Wt 159.3 lb

## 2024-06-04 DIAGNOSIS — Z515 Encounter for palliative care: Secondary | ICD-10-CM

## 2024-06-04 DIAGNOSIS — C7931 Secondary malignant neoplasm of brain: Secondary | ICD-10-CM

## 2024-06-04 DIAGNOSIS — R530 Neoplastic (malignant) related fatigue: Secondary | ICD-10-CM | POA: Diagnosis not present

## 2024-06-04 DIAGNOSIS — F32A Depression, unspecified: Secondary | ICD-10-CM

## 2024-06-04 DIAGNOSIS — G893 Neoplasm related pain (acute) (chronic): Secondary | ICD-10-CM | POA: Diagnosis not present

## 2024-06-04 DIAGNOSIS — C3490 Malignant neoplasm of unspecified part of unspecified bronchus or lung: Secondary | ICD-10-CM

## 2024-06-04 DIAGNOSIS — Z5112 Encounter for antineoplastic immunotherapy: Secondary | ICD-10-CM | POA: Diagnosis not present

## 2024-06-04 DIAGNOSIS — R630 Anorexia: Secondary | ICD-10-CM

## 2024-06-04 LAB — CMP (CANCER CENTER ONLY)
ALT: 29 U/L (ref 0–44)
AST: 27 U/L (ref 15–41)
Albumin: 3.7 g/dL (ref 3.5–5.0)
Alkaline Phosphatase: 80 U/L (ref 38–126)
Anion gap: 5 (ref 5–15)
BUN: 14 mg/dL (ref 6–20)
CO2: 27 mmol/L (ref 22–32)
Calcium: 9 mg/dL (ref 8.9–10.3)
Chloride: 107 mmol/L (ref 98–111)
Creatinine: 0.58 mg/dL — ABNORMAL LOW (ref 0.61–1.24)
GFR, Estimated: >60 mL/min (ref >60)
Glucose, Bld: 107 mg/dL — ABNORMAL HIGH (ref 70–99)
Potassium: 4 mmol/L (ref 3.5–5.1)
Sodium: 139 mmol/L (ref 135–145)
Total Bilirubin: 0.3 mg/dL (ref 0.0–1.2)
Total Protein: 7.3 g/dL (ref 6.5–8.1)

## 2024-06-04 LAB — CBC WITH DIFFERENTIAL (CANCER CENTER ONLY)
Abs Immature Granulocytes: 0.02 K/uL (ref 0.00–0.07)
Basophils Absolute: 0 K/uL (ref 0.0–0.1)
Basophils Relative: 1 %
Eosinophils Absolute: 0.1 K/uL (ref 0.0–0.5)
Eosinophils Relative: 1 %
HCT: 38.7 % — ABNORMAL LOW (ref 39.0–52.0)
Hemoglobin: 13.3 g/dL (ref 13.0–17.0)
Immature Granulocytes: 0 %
Lymphocytes Relative: 19 %
Lymphs Abs: 1.3 K/uL (ref 0.7–4.0)
MCH: 32.5 pg (ref 26.0–34.0)
MCHC: 34.4 g/dL (ref 30.0–36.0)
MCV: 94.6 fL (ref 80.0–100.0)
Monocytes Absolute: 0.6 K/uL (ref 0.1–1.0)
Monocytes Relative: 9 %
Neutro Abs: 4.8 K/uL (ref 1.7–7.7)
Neutrophils Relative %: 70 %
Platelet Count: 243 K/uL (ref 150–400)
RBC: 4.09 MIL/uL — ABNORMAL LOW (ref 4.22–5.81)
RDW: 13.2 % (ref 11.5–15.5)
WBC Count: 6.8 K/uL (ref 4.0–10.5)
nRBC: 0 % (ref 0.0–0.2)

## 2024-06-04 MED ORDER — SODIUM CHLORIDE 0.9 % IV SOLN
500.0000 mg/m2 | Freq: Once | INTRAVENOUS | Status: AC
  Administered 2024-06-04: 900 mg via INTRAVENOUS
  Filled 2024-06-04: qty 20

## 2024-06-04 MED ORDER — PROCHLORPERAZINE MALEATE 10 MG PO TABS
10.0000 mg | ORAL_TABLET | Freq: Once | ORAL | Status: AC
  Administered 2024-06-04: 10 mg via ORAL
  Filled 2024-06-04: qty 1

## 2024-06-04 MED ORDER — SODIUM CHLORIDE 0.9% FLUSH
10.0000 mL | Freq: Once | INTRAVENOUS | Status: AC
Start: 2024-06-04 — End: 2024-06-04
  Administered 2024-06-04: 10 mL

## 2024-06-04 MED ORDER — SODIUM CHLORIDE 0.9 % IV SOLN
200.0000 mg | Freq: Once | INTRAVENOUS | Status: AC
  Administered 2024-06-04: 200 mg via INTRAVENOUS
  Filled 2024-06-04: qty 200

## 2024-06-04 MED ORDER — SODIUM CHLORIDE 0.9% FLUSH
10.0000 mL | INTRAVENOUS | Status: DC | PRN
  Administered 2024-06-04: 10 mL

## 2024-06-04 MED ORDER — SODIUM CHLORIDE 0.9 % IV SOLN: Freq: Once | INTRAVENOUS | Status: AC

## 2024-06-04 MED ORDER — HEPARIN SOD (PORK) LOCK FLUSH 100 UNIT/ML IV SOLN
500.0000 [IU] | Freq: Once | INTRAVENOUS | Status: AC | PRN
Start: 2024-06-04 — End: 2024-06-04
  Administered 2024-06-04: 500 [IU]

## 2024-06-04 NOTE — Progress Notes (Signed)
 Palliative Medicine [facility]  [phone]) [phone] [phone]) [phone]   Name: Samuel [name] Date: 06/04/2024 MRN: [id number]  DOB: 06/07/1966  Patient Care Team: [name], MD as PCP - General (Family Medicine) Pickenpack-Cousar, Fannie SAILOR, NP as Nurse Practitioner (Nurse Practitioner)    INTERVAL HISTORY: Samuel [name] is a 58 y.o. 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  Palliative ask to see for symptom management and goals of care.   SOCIAL HISTORY:     reports that he has been smoking cigarettes. He started smoking about 18 months ago. He has a 0.8 pack-year smoking history. He has never used smokeless tobacco. He reports current alcohol  use of about 2.0 standard drinks of alcohol  per week. He reports that he does not use drugs.  ADVANCE DIRECTIVES:  Patient does not have an advanced directives. Education and packet provided. He has expressed interest in completing. States his sister, Sydney Counts would be his designated Clinical research associate.   CODE STATUS: Full code  PAST MEDICAL HISTORY: Past Medical History:  Diagnosis Date   Alcohol  use disorder, severe, dependence (HCC) 12/26/2016   Anxiety    Depression    Diabetes mellitus without complication (HCC)    Metastatic cancer to brain (HCC) 08/05/2022   Metastatic cancer to spine (HCC) 07/19/2022   Pancreatitis unk   Primary lung adenocarcinoma (HCC) 08/05/2022   Suicidal ideation    Type 2 diabetes mellitus with hyperglycemia, without long-term current use of insulin  (HCC) 06/13/2022   Formatting of this note might be different from the original. 06/13/2022 A1C 13.8, FSBG 414 Start empagliflozin  5mg /metformin  1000 daily, levemir 20 qhs. (Samples given) Will apply for medassist for pharmacy    ALLERGIES:  is allergic to penicillins and percocet  [oxycodone -acetaminophen ].  MEDICATIONS:  Current Outpatient Medications  Medication Sig Dispense Refill   acetaminophen  (TYLENOL ) 500 MG tablet Take 1,000 mg by mouth every 6 (six) hours as needed for moderate pain (pain score 4-6).     atorvastatin  (LIPITOR) 40 MG tablet Take 1 tablet (40 mg total) by mouth daily. 90 tablet 1   buPROPion  (WELLBUTRIN  XL) 150 MG 24 hr tablet Take 1 tablet (150 mg total) by mouth daily. 90 tablet 1   cetirizine  (ZYRTEC ) 10 MG tablet Take 1 tablet (10 mg total) by mouth daily. 30 tablet 1   Continuous Glucose Receiver (FREESTYLE LIBRE 3 READER) DEVI Use to check blood sugar continuously throughout the day. 1 each 0   Continuous Glucose Sensor (FREESTYLE LIBRE 3 PLUS SENSOR) MISC Change sensor every 15 days. Use to check blood sugar continuously. 2 each 6   empagliflozin  (JARDIANCE ) 25 MG TABS tablet Take 1 tablet (25 mg total) by mouth daily before breakfast. 90 tablet 1   escitalopram  (LEXAPRO ) 10 MG tablet Take 1 tablet (10 mg total) by mouth daily. 30 tablet 1   famotidine  (PEPCID ) 40 MG tablet Take 1 tablet (40 mg total) by mouth daily. 30 tablet 3   fluticasone  (FLONASE ) 50 MCG/ACT nasal spray Place 2 sprays into both nostrils daily. 16 g 1   folic acid  (FOLVITE ) 1 MG tablet Take 1 tablet (1 mg total) by mouth daily. 30 tablet 4   gabapentin  (NEURONTIN ) 300 MG capsule Take 2 capsules (600 mg total) by mouth 3 (three) times daily. 180 capsule 1   glucose blood (ACCU-CHEK GUIDE TEST) test strip Use as instructed  twice daily 100 each 12   glucose blood (ACCU-CHEK GUIDE) test strip Use to check blood sugar 3 times daily. E11.69 100 each 6   insulin  glargine (LANTUS  SOLOSTAR) 100 UNIT/ML Solostar Pen Inject 30 units under the skin in the morning and 14 units in the evening. 45 mL 1   Insulin  Pen Needle 32G X 4 MM MISC Use with Insulin  pen 100 each 3   methocarbamol  (ROBAXIN ) 500 MG tablet Take 1 tablet (500 mg total) by mouth every 6 (six) hours as needed for  muscle spasms. 90 tablet 0   oxyCODONE  (OXYCONTIN ) 10 mg 12 hr tablet Take 1 tablet (10 mg total) by mouth every 12 (twelve) hours. 60 tablet 0   Oxycodone  HCl 10 MG TABS Take 1 tablet (10 mg total) by mouth every 6 (six) hours as needed. 60 tablet 0   sildenafil  (VIAGRA ) 100 MG tablet Take 0.5-1 tablets (50-100 mg total) by mouth daily as needed for erectile dysfunction. 5 tablet 11   No current facility-administered medications for this visit.   Facility-Administered Medications Ordered in Other Visits  Medication Dose Route Frequency Provider Last Rate Last Admin   sodium chloride  flush (NS) 0.9 % injection 10 mL  10 mL Intracatheter PRN Sherrod Sherrod, MD   10 mL at 06/04/24 1124   VITAL SIGNS: BP 120/81 (BP Location: Left Arm, Patient Position: Sitting)   Pulse 85   Temp 98.1 F (36.7 C) (Temporal)   Resp 17   Wt 159 lb 4.8 oz (72.3 kg)   SpO2 100%   BMI 24.22 kg/m  Filed Weights   06/04/24 0926  Weight: 159 lb 4.8 oz (72.3 kg)    Estimated body mass index is 24.22 kg/m as calculated from the following:   Height as of 05/06/24: 5' 8 (1.727 m).   Weight as of this encounter: 159 lb 4.8 oz (72.3 kg).   PERFORMANCE STATUS (ECOG) : 1 - Symptomatic but completely ambulatory  Physical Exam General: NAD Cardiovascular: regular rate and rhythm Pulmonary: normal breathing pattern Extremities: no edema, no joint deformities Skin: no rashes Neurological: AAO x3  IMPRESSION: Discussed the use of AI scribe software for clinical note transcription with the patient, who gave verbal consent to proceed.  History of Present Illness Samuel [name] is a 58 year old male who presents for symptom management follow-up. Patient reports he is doing well overall.  Denies concerns for nausea, vomiting, constipation, or diarrhea.  Occasional fatigue however patient remains as active as possible.  He is managing his cancer with various medications and is exploring new supplements to  enhance energy levels and improve circulation, specifically mentioning interest in products like Nugenix Total T2 and SuperBeets.  His appetite had decreased, but after consulting with his therapist and adjusting his medications, including Wellbutrin  150 mg daily and Lexapro  10 mg, he is feeling better. He also uses a 5 mg gummy from a dispensary to help with his appetite, which has been effective, as he is now back to eating regularly. His weight is up to 159lbs from 154lbs which he is appreciative of.   He is managing his diabetes and is working on being more consistent with his insulin  regimen. He takes a B complex vitamin daily.   We discussed his overall pain at length.  His pain is well-controlled with OxyContin  every 12 hours and oxycodone  as needed for breakthrough pain.  Does not require breakthrough medication around-the-clock.  Some days is able to go without.  Has been  able to decrease gabapentin  600 mg down to twice a day versus 3 times daily.  No adjustments to current regimen at this time.  All questions answered and support provided.   Assessment & Plan Cancer Related Pain Pain is well controlled with oxycodone  10 mg every six hours as needed and OxyContin  every twelve hours. Reports adequate pain control with current regimen.  Is not requiring frequent use of breakthrough medication. - Continue oxycodone  10 mg every six hours as needed - Continue OxyContin  every twelve hours - Continue with gabapentin  600 mg.  Patient has been able to decrease to twice daily.  Appetite He experiences appetite issues, addressed with a 2.5-5 mg gummy for appetite and sleep purchase from dispensary. Weight is 159 lbs, up from 157 lbs three weeks ago.   Depression Depression is managed with Lexapro  and Wellbutrin . He reported feeling in a funk two weeks ago but is working with his Dance movement psychotherapist. Currently on Lexapro  10 mg and Wellbutrin  150 mg daily. Previously inconsistent with  medications but now feeling better and more consistent. - Continue Lexapro  10 mg daily - Continue Wellbutrin  150 mg daily  Diabetes mellitus Diabetes management includes insulin  therapy. He is working on consistency with insulin  administration and uses a pill case for medication adherence. Considering Nugenics Total T2 and SuperBeets supplements, checked for interactions and found safe. - Continue insulin  therapy as prescribed - Use pill case to improve medication adherence  Follow-up Follow-up appointments scheduled to monitor ongoing management of chronic conditions. - Schedule follow-up appointment in 4-6 weeks. Sooner if needed.  Patient expressed understanding and was in agreement with this plan. He also understands that He can call the clinic at any time with any questions, concerns, or complaints.   Any controlled substances utilized were prescribed in the context of palliative care. PDMP has been reviewed.   Visit consisted of counseling and education dealing with the complex and emotionally intense issues of symptom management and palliative care in the setting of serious and potentially life-threatening illness.  Levon Borer, AGPCNP-BC  Palliative Medicine Team/[facility]

## 2024-06-04 NOTE — Progress Notes (Signed)
 Chart review.

## 2024-06-04 NOTE — Patient Instructions (Signed)
 [facility] - A DEPT OF [facility]  Discharge Instructions: Thank you for choosing [facility] to provide your oncology and hematology care.   If you have a lab appointment with the Cancer Center, please go directly to the Cancer Center and check in at the registration area.   Wear comfortable clothing and clothing appropriate for easy access to any Portacath or PICC line.   We strive to give you quality time with your provider. You may need to reschedule your appointment if you arrive late (15 or more minutes).  Arriving late affects you and other patients whose appointments are after yours.  Also, if you miss three or more appointments without notifying the office, you may be dismissed from the clinic at the provider's discretion.      For prescription refill requests, have your pharmacy contact our office and allow 72 hours for refills to be completed.    Today you received the following chemotherapy and/or immunotherapy agents: Pemetrexed , Pembrolizumab       To help prevent nausea and vomiting after your treatment, we encourage you to take your nausea medication as directed.  BELOW ARE SYMPTOMS THAT SHOULD BE REPORTED IMMEDIATELY: *FEVER GREATER THAN 100.4 F (38 C) OR HIGHER *CHILLS OR SWEATING *NAUSEA AND VOMITING THAT IS NOT CONTROLLED WITH YOUR NAUSEA MEDICATION *UNUSUAL SHORTNESS OF BREATH *UNUSUAL BRUISING OR BLEEDING *URINARY PROBLEMS (pain or burning when urinating, or frequent urination) *BOWEL PROBLEMS (unusual diarrhea, constipation, pain near the anus) TENDERNESS IN MOUTH AND THROAT WITH OR WITHOUT PRESENCE OF ULCERS (sore throat, sores in mouth, or a toothache) UNUSUAL RASH, SWELLING OR PAIN  UNUSUAL VAGINAL DISCHARGE OR ITCHING   Items with * indicate a potential emergency and should be followed up as soon as possible or go to the Emergency Department if any problems should occur.  Please show the CHEMOTHERAPY ALERT CARD  or IMMUNOTHERAPY ALERT CARD at check-in to the Emergency Department and triage nurse.  Should you have questions after your visit or need to cancel or reschedule your appointment, please contact [facility]  Dept: [phone]  and follow the prompts.  Office hours are 8:00 a.m. to 4:30 p.m. Monday - Friday. Please note that voicemails left after 4:00 p.m. may not be returned until the following business day.  We are closed weekends and major holidays. You have access to a nurse at all times for urgent questions. Please call the main number to the clinic Dept: [phone] and follow the prompts.   For any non-urgent questions, you may also contact your provider using MyChart. We now offer e-Visits for anyone 59 and older to request care online for non-urgent symptoms. For details visit [email or web address].   Also download the MyChart app! Go to the app store, search MyChart, open the app, select [facility], and log in with your MyChart username and password.

## 2024-06-11 ENCOUNTER — Ambulatory Visit (HOSPITAL_COMMUNITY): Admitting: Physician Assistant

## 2024-06-11 ENCOUNTER — Other Ambulatory Visit (HOSPITAL_COMMUNITY): Payer: Self-pay

## 2024-06-11 DIAGNOSIS — F411 Generalized anxiety disorder: Secondary | ICD-10-CM

## 2024-06-11 DIAGNOSIS — F4310 Post-traumatic stress disorder, unspecified: Secondary | ICD-10-CM

## 2024-06-11 DIAGNOSIS — F322 Major depressive disorder, single episode, severe without psychotic features: Secondary | ICD-10-CM

## 2024-06-11 DIAGNOSIS — F17200 Nicotine dependence, unspecified, uncomplicated: Secondary | ICD-10-CM | POA: Diagnosis not present

## 2024-06-11 MED ORDER — BUPROPION HCL ER (XL) 150 MG PO TB24
150.0000 mg | ORAL_TABLET | Freq: Every day | ORAL | 0 refills | Status: AC
Start: 2024-06-11 — End: ?
  Filled 2024-06-11 – 2024-10-18 (×2): qty 90, 90d supply, fill #0

## 2024-06-11 MED ORDER — ESCITALOPRAM OXALATE 10 MG PO TABS
10.0000 mg | ORAL_TABLET | Freq: Every day | ORAL | 0 refills | Status: AC
  Filled 2024-06-11: qty 90, 90d supply, fill #0

## 2024-06-11 NOTE — Progress Notes (Signed)
 [facility] MD/PA/NP OP Progress Note  06/11/2024 10:00 AM Samuel [name]  MRN:  [id number]  Chief Complaint:  Chief Complaint  Patient presents with   Follow-up   Medication Refill   HPI:   Samuel [name]. Correa [name] is a 58 year old male with a past psychiatric history significant for generalized anxiety disorder, major depressive disorder (severe), PTSD, and tobacco dependence who presents to [facility] for follow-up and medication management.  Patient is currently being managed on the following psychiatric medications:   Bupropion  (Wellbutrin  XL) 150 mg 24-hour tablet daily. Escitalopram  10 mg daily  Patient reports that they have been taking their medications regularly and denies experiencing any adverse side effects.  Patient reports that they last experienced depressive episodes 2 weeks ago.  He reports that the depressive episode occurred out of nowhere and lasted for a week before dissipating.  Patient denies any discernible triggers that caused his past episode of depression.  Patient reports that everything is good at this time and states that his chemotherapy has been going well.  Patient denies overt depressive episodes or does he endorse anxiety.  Patient further denies any new stressors at this time.  A PHQ-9 screen was performed with the patient scoring an 8.  A GAD-7 screen was also performed with the patient scoring a 6.  Patient is alert and oriented x 4, calm, cooperative, and fully engaged in conversation during the encounter.  Patient endorses calm and good mood.  Patient exhibits euthymic mood with appropriate affect.  Patient denies suicidal or homicidal ideations.  He further denies auditory or visual hallucinations and does not appear to be responding to internal/external stimuli.  Patient endorses fair sleep and receives on average 5 to 6 hours of sleep per night.  Patient endorses fair appetite needs on average 2 meals per day.  Patient  endorses alcohol  consumption and drinks beer 1-2 times per week.  Patient endorses tobacco use and smokes on average 1/2 pack/day.  Patient denies illicit drug use.  Visit Diagnosis:    ICD-10-CM   1. GAD (generalized anxiety disorder)  F41.1 escitalopram  (LEXAPRO ) 10 MG tablet    2. Major depressive disorder, severe (HCC)  F32.2 escitalopram  (LEXAPRO ) 10 MG tablet    buPROPion  (WELLBUTRIN  XL) 150 MG 24 hr tablet    3. PTSD (post-traumatic stress disorder)  F43.10 escitalopram  (LEXAPRO ) 10 MG tablet    4. Tobacco dependence  F17.200 buPROPion  (WELLBUTRIN  XL) 150 MG 24 hr tablet      Past Psychiatric History:  Patient endorses a past psychiatric history significant for anxiety, major depressive disorder, and PTSD.   Patient endorses a past history of hospitalization due to mental health. He reports that his last hospitalization occurred 3.5 - 4 years ago. - Patient reports that he was hospitalized at [facility] due to stress and crying spells related to his current living situation.   Patient denies a past history of suicide attempt.   Patient denies a past history of homicide attempt.  Past Medical History:  Past Medical History:  Diagnosis Date   Alcohol  use disorder, severe, dependence (HCC) 12/26/2016   Anxiety    Depression    Diabetes mellitus without complication (HCC)    Metastatic cancer to brain (HCC) 08/05/2022   Metastatic cancer to spine (HCC) 07/19/2022   Pancreatitis unk   Primary lung adenocarcinoma (HCC) 08/05/2022   Suicidal ideation    Type 2 diabetes mellitus with hyperglycemia, without long-term current use of insulin  (  HCC) 06/13/2022   Formatting of this note might be different from the original. 06/13/2022 A1C 13.8, FSBG 414 Start empagliflozin  5mg /metformin  1000 daily, levemir 20 qhs. (Samples given) Will apply for medassist for pharmacy    Past Surgical History:  Procedure Laterality Date   APPLICATION OF ROBOTIC ASSISTANCE FOR  SPINAL PROCEDURE N/A 02/27/2024   Procedure: APPLICATION OF ROBOTIC ASSISTANCE FOR SPINAL PROCEDURE;  Surgeon: Lanis Pupa, MD;  Location: [facility] OR;  Service: Neurosurgery;  Laterality: N/A;   IR BONE TUMOR(S)RF ABLATION  08/01/2022   IR IMAGING GUIDED PORT INSERTION  09/21/2022   IR KYPHO LUMBAR INC FX REDUCE BONE BX UNI/BIL CANNULATION INC/IMAGING  08/01/2022   LAMINECTOMY WITH POSTERIOR LATERAL ARTHRODESIS LEVEL 4 N/A 02/27/2024   Procedure: Lumbar two LAMINECTOMY, Thoracic twelve-Lumbar four INSTRUMENTED STABILIZATION;  Surgeon: Lanis Pupa, MD;  Location: [facility] OR;  Service: Neurosurgery;  Laterality: N/A;    Family Psychiatric History:  Patient denies a family history of psychiatric illness.  Family History: History reviewed. No pertinent family history.  Social History:  Social History   Socioeconomic History   Marital status: Single    Spouse name: Not on file   Number of children: 0   Years of education: Not on file   Highest education level: Associate degree: academic program  Occupational History   Not on file  Tobacco Use   Smoking status: Every Day    Current packs/day: 0.50    Average packs/day: 0.5 packs/day for 1.6 years (0.8 ttl pk-yrs)    Types: Cigarettes    Start date: 11/16/2022    Last attempt to quit: 08/17/2022   Smokeless tobacco: Never  Vaping Use   Vaping status: Never Used  Substance and Sexual Activity   Alcohol  use: Yes    Alcohol /week: 2.0 standard drinks of alcohol     Types: 2 Cans of beer per week   Drug use: No   Sexual activity: Yes    Partners: Female    Birth control/protection: None  Other Topics Concern   Not on file  Social History Narrative   Not on file   Social Drivers of Health   Financial Resource Strain: Medium Risk (04/23/2024)   Overall Financial Resource Strain (CARDIA)    Difficulty of Paying Living Expenses: Somewhat hard  Food Insecurity: No Food Insecurity (03/28/2024)   Hunger Vital Sign    Worried About Running  Out of Food in the Last Year: Never true    Ran Out of Food in the Last Year: Never true  Transportation Needs: No Transportation Needs (03/28/2024)   PRAPARE - Administrator, Civil Service (Medical): No    Lack of Transportation (Non-Medical): No  Physical Activity: Insufficiently Active (04/23/2024)   Exercise Vital Sign    Days of Exercise per Week: 2 days    Minutes of Exercise per Session: 40 min  Stress: Stress Concern Present (04/23/2024)   [employer] of Occupational Health - Occupational Stress Questionnaire    Feeling of Stress : To some extent  Social Connections: Moderately Isolated (04/23/2024)   Social Connection and Isolation Panel    Frequency of Communication with Friends and Family: Twice a week    Frequency of Social Gatherings with Friends and Family: More than three times a week    Attends Religious Services: 1 to 4 times per year    Active Member of [employer] or Organizations: No    Attends Banker Meetings: Never    Marital Status: Divorced    Allergies:  Allergies  Allergen Reactions   Penicillins Hives   Percocet [Oxycodone -Acetaminophen ] Nausea Only and Other (See Comments)    Pt states the pill is hard to swallow. States he was told it may be the coating on the tablet that he is allergic to.     Metabolic Disorder Labs: Lab Results  Component Value Date   HGBA1C 8.7 (H) 02/27/2024   MPG 202.99 02/27/2024   MPG 194.38 08/28/2023   No results found for: PROLACTIN Lab Results  Component Value Date   CHOL 133 07/12/2023   TRIG 64 07/12/2023   HDL 41 07/12/2023   CHOLHDL 3.2 07/12/2023   VLDL 23 03/11/2021   LDLCALC 79 07/12/2023   LDLCALC 61 01/19/2023   Lab Results  Component Value Date   TSH 0.536 05/13/2024   TSH 0.490 03/11/2024    Therapeutic Level Labs: No results found for: LITHIUM No results found for: VALPROATE No results found for: CBMZ  Current Medications: Current Outpatient Medications   Medication Sig Dispense Refill   acetaminophen  (TYLENOL ) 500 MG tablet Take 1,000 mg by mouth every 6 (six) hours as needed for moderate pain (pain score 4-6).     atorvastatin  (LIPITOR) 40 MG tablet Take 1 tablet (40 mg total) by mouth daily. 90 tablet 1   buPROPion  (WELLBUTRIN  XL) 150 MG 24 hr tablet Take 1 tablet (150 mg total) by mouth daily. 90 tablet 0   cetirizine  (ZYRTEC ) 10 MG tablet Take 1 tablet (10 mg total) by mouth daily. 30 tablet 1   Continuous Glucose Receiver (FREESTYLE LIBRE 3 READER) DEVI Use to check blood sugar continuously throughout the day. 1 each 0   Continuous Glucose Sensor (FREESTYLE LIBRE 3 PLUS SENSOR) MISC Change sensor every 15 days. Use to check blood sugar continuously. 2 each 6   empagliflozin  (JARDIANCE ) 25 MG TABS tablet Take 1 tablet (25 mg total) by mouth daily before breakfast. 90 tablet 1   escitalopram  (LEXAPRO ) 10 MG tablet Take 1 tablet (10 mg total) by mouth daily. 90 tablet 0   famotidine  (PEPCID ) 40 MG tablet Take 1 tablet (40 mg total) by mouth daily. 30 tablet 3   fluticasone  (FLONASE ) 50 MCG/ACT nasal spray Place 2 sprays into both nostrils daily. 16 g 1   folic acid  (FOLVITE ) 1 MG tablet Take 1 tablet (1 mg total) by mouth daily. 30 tablet 4   gabapentin  (NEURONTIN ) 300 MG capsule Take 2 capsules (600 mg total) by mouth 3 (three) times daily. 180 capsule 1   glucose blood (ACCU-CHEK GUIDE TEST) test strip Use as instructed twice daily 100 each 12   glucose blood (ACCU-CHEK GUIDE) test strip Use to check blood sugar 3 times daily. E11.69 100 each 6   insulin  glargine (LANTUS  SOLOSTAR) 100 UNIT/ML Solostar Pen Inject 30 units under the skin in the morning and 14 units in the evening. 45 mL 1   Insulin  Pen Needle 32G X 4 MM MISC Use with Insulin  pen 100 each 3   methocarbamol  (ROBAXIN ) 500 MG tablet Take 1 tablet (500 mg total) by mouth every 6 (six) hours as needed for muscle spasms. 90 tablet 0   oxyCODONE  (OXYCONTIN ) 10 mg 12 hr tablet Take 1  tablet (10 mg total) by mouth every 12 (twelve) hours. 60 tablet 0   Oxycodone  HCl 10 MG TABS Take 1 tablet (10 mg total) by mouth every 6 (six) hours as needed. 60 tablet 0   sildenafil  (VIAGRA ) 100 MG tablet Take 0.5-1 tablets (50-100 mg total) by mouth  daily as needed for erectile dysfunction. 5 tablet 11   No current facility-administered medications for this visit.     Musculoskeletal: Strength & Muscle Tone: within normal limits Gait & Station: normal Patient leans: N/A  Psychiatric Specialty Exam: Review of Systems  Psychiatric/Behavioral:  Positive for dysphoric mood and sleep disturbance. Negative for decreased concentration, hallucinations, self-injury and suicidal ideas. The patient is nervous/anxious. The patient is not hyperactive.     Blood pressure 113/74, pulse 86, height 5' 8 (1.727 m), weight 159 lb 3.2 oz (72.2 kg), SpO2 100%.Body mass index is 24.21 kg/m.  General Appearance: Casual  Eye Contact:  Good  Speech:  Clear and Coherent and Normal Rate  Volume:  Normal  Mood:  Anxious and Depressed  Affect:  Appropriate  Thought Process:  Coherent, Goal Directed, and Descriptions of Associations: Intact  Orientation:  Full (Time, Place, and Person)  Thought Content: WDL   Suicidal Thoughts:  No  Homicidal Thoughts:  No  Memory:  Immediate;   Good Recent;   Good Remote;   Good  Judgement:  Good  Insight:  Good  Psychomotor Activity:  Normal  Concentration:  Concentration: Good and Attention Span: Good  Recall:  Good  Fund of Knowledge: Good  Language: Good  Akathisia:  No  Handed:  Right  AIMS (if indicated): not done  Assets:  Communication Skills Desire for Improvement Financial Resources/Insurance Housing Social Support Transportation  ADL's:  Intact  Cognition: WNL  Sleep:  Fair   Screenings: AIMS    Flowsheet Row [facility] Ad[facility] (Discharged) from 12[facility]23/2016 in [facility]  INPATIENT BEHAVIORAL MEDICINE  AIMS Total Score 0 0   AUDIT    Flowsheet Row [facility] Ad[facility] (Discharged) from 12/23/2016 in [facility]PAT[facility]ICINE  Alcohol  Use Disorder Identification Test Final Score (AUDIT) 10 29   CAGE-AID    Flowsheet Row Office Visit from 03/22/2024 in [facility]  CAGE-AID Score 4   GAD-7    Flowsheet Row Clinical Support from 06/11/2024 in [facility] Counselor from 05/31/2024 in [facility] Office Visit from 05/06/2024 in [facility] [place] [facility] Of [place] Clinical Support from 04/25/2024 in [facility] Counselor from 04/23/2024 in [facility]  Total GAD-7 Score 6 7 9 8 7    PHQ2-9    Flowsheet Row Clinical Support from 06/11/2024 in [facility] Counselor from 05/31/2024 in [facility] Office Visit from 05/06/2024 in [facility] [place] [facility] A Dept Of [place] Clinical Support from 04/25/2024 in [facility] Counselor from 04/23/2024 in [facility]  PHQ-2 Total Score 2 2 4 4 2   PHQ-9 Total Score 8 7 13 13 9    Flowsheet Row Clinical Support from 06/11/2024 in [facility] Clinical Support from 04/25/2024 in [facility] Counselor from 04/23/2024 in [facility]  C-SSRS RISK CATEGORY No Risk No Risk No Risk     Assessment and Plan:   Doug Bucklin. Turnley [name] is a 58 year old male with a past psychiatric history significant for generalized anxiety disorder, major depressive disorder (severe), PTSD, and tobacco dependence who presents to [facility] for  follow-up and  medication management.  Patient reports that he has been taking his medications regularly and denies experiencing any adverse side effects at this time.  Patient reports that he had an episode of depression that he experienced roughly 2 weeks ago that lasted for roughly a week.  Patient denies any discernible triggers to his recent depressive episode but states that he is doing much better at this time.  Patient denies overt depressive symptoms nor does he endorse anxiety at this time.  A PHQ-9 screen was performed with the patient scoring an 8.  A GAD-7 screen was also performed with the patient scoring a 6.  Patient denies the need for dosage adjustments at this time and would like to continue taking his medications as prescribed.  Patient's medications to be e-prescribed to pharmacy of choice.  A [place] Suicide Severity Rating Scale was performed with the patient being considered no risk.  Patient denies suicidal ideations and is able to contract for safety following the conclusion of the encounter.  Collaboration of Care: Collaboration of Care: Medication Management AEB provider managing patient's psychiatric medications, Primary Care Provider AEB patient being followed by her primary care provider (internal medicine), Psychiatrist AEB patient being followed by mental health provider at this facility, Other provider involved in patient's care AEB patient being seen by oncology, and Referral or follow-up with counselor/therapist AEB patient being seen by licensed clinical social worker at this facility.  Patient/Guardian was advised Release of Information must be obtained prior to any record release in order to collaborate their care with an outside provider. Patient/Guardian was advised if they have not already done so to contact the registration department to sign all necessary forms in order for us  to release information regarding their care.   Consent: Patient/Guardian gives verbal  consent for treatment and assignment of benefits for services provided during this visit. Patient/Guardian expressed understanding and agreed to proceed.   1. GAD (generalized anxiety disorder)  - escitalopram  (LEXAPRO ) 10 MG tablet; Take 1 tablet (10 mg total) by mouth daily.  Dispense: 90 tablet; Refill: 0  2. Major depressive disorder, severe (HCC)  - escitalopram  (LEXAPRO ) 10 MG tablet; Take 1 tablet (10 mg total) by mouth daily.  Dispense: 90 tablet; Refill: 0 - buPROPion  (WELLBUTRIN  XL) 150 MG 24 hr tablet; Take 1 tablet (150 mg total) by mouth daily.  Dispense: 90 tablet; Refill: 0  3. PTSD (post-traumatic stress disorder)  - escitalopram  (LEXAPRO ) 10 MG tablet; Take 1 tablet (10 mg total) by mouth daily.  Dispense: 90 tablet; Refill: 0  4. Tobacco dependence  - buPROPion  (WELLBUTRIN  XL) 150 MG 24 hr tablet; Take 1 tablet (150 mg total) by mouth daily.  Dispense: 90 tablet; Refill: 0  Patient to follow-up in 6 weeks Provider spent a total of 17 minutes with the patient/reviewing patient's chart  Reginia FORBES Bolster, PA 06/11/2024, 10:00 AM

## 2024-06-12 ENCOUNTER — Encounter (HOSPITAL_COMMUNITY): Payer: Self-pay | Admitting: Physician Assistant

## 2024-06-18 NOTE — Progress Notes (Signed)
 [facility] OFFICE PROGRESS NOTE  [name], MD [address] 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    Biomarker Findings Microsatellite status - MS-Stable Tumor Mutational Burden - 8 Muts/Mb Genomic Findings For a complete list of the genes assayed, please refer to the Appendix. KEAP1 Q227* CDKN2A/B [id number] G89V PRKCI amplification TERC amplification - equivocal? TP53 [id number]*18 8 Disease relevant genes with no reportable alterations: ALK, BRAF, EGFR, ERBB2, KRAS, MET, RET, ROS1   PDL1 TPS  90%  PRIOR THERAPY: 1) Status post palliative radiotherapy to the L2 lesion under the care of Dr. Patrcia. 2) Status post SRS to solitary brain metastasis under the care of Dr. Patrcia 3) status post L2 laminectomy for decompression of the thecal sac under the care of Dr. Lanis on 02/27/2024.  CURRENT THERAPY:  Palliative systemic therapy with carboplatin  for AUC of 5, Alimta  500 Mg/M2 and Keytruda  200 Mg IV every 3 weeks. First dose September 05, 2022. Status post 31 cycles. Starting from cycle #5, he started on maintenance keytruda  and alimta  IV every 3 weeks.   INTERVAL HISTORY: Samuel [name] 58 y.o. male returns to the clinic today for a follow-up visit.  The patient was last seen by myself 3 weeks ago.  He is currently on maintenance immunotherapy and chemotherapy.  He is on this every 3 weeks and tolerates it well except he gets intermittent fatigue. Overall it is manageable but he does feel like he is more tired than before.  He denies any major changes in his health since he was last seen.   He denies any fever or chills. It is not unusual for him to get chills now and then sporadically. His appetite comes and goes. He did lose a few pounds since last being seen.      For allergies he uses Flonase  and Zyrtec .  He denies any significant shortness of breath. He denies cough. He denies any chest pain or hemoptysis.  He denies any headaches. He denies any nausea, vomiting, diarrhea, or constipation. He recently had a restaging CT scan.  His purpose of his visit is for toxicity check prior to undergoing treatment today with cycle #32.     MEDICAL HISTORY: Past Medical History:  Diagnosis Date   Alcohol  use disorder, severe, dependence (HCC) 12/26/2016   Anxiety    Depression    Diabetes mellitus without complication (HCC)    Metastatic cancer to brain (HCC) 08/05/2022   Metastatic cancer to spine (HCC) 07/19/2022   Pancreatitis unk   Primary lung adenocarcinoma (HCC) 08/05/2022   Suicidal ideation    Type 2 diabetes mellitus with hyperglycemia, without long-term current use of insulin  (HCC) 06/13/2022   Formatting of this note might be different from the original. 06/13/2022 A1C 13.8, FSBG 414 Start empagliflozin  5mg /metformin  1000 daily, levemir 20 qhs. (Samples given) Will apply for medassist for pharmacy    ALLERGIES:  is allergic to penicillins and percocet [oxycodone -acetaminophen ].  MEDICATIONS:  Current Outpatient Medications  Medication Sig Dispense Refill   acetaminophen  (TYLENOL ) 500 MG tablet Take 1,000 mg by mouth every 6 (six) hours as needed for moderate pain (pain score 4-6).  atorvastatin  (LIPITOR) 40 MG tablet Take 1 tablet (40 mg total) by mouth daily. 90 tablet 1   buPROPion  (WELLBUTRIN  XL) 150 MG 24 hr tablet Take 1 tablet (150 mg total) by mouth daily. 90 tablet 0   cetirizine  (ZYRTEC ) 10 MG tablet Take 1 tablet (10 mg total) by mouth daily. 30 tablet 1   Continuous Glucose Receiver (FREESTYLE LIBRE 3 READER) DEVI Use to check blood sugar continuously  throughout the day. 1 each 0   Continuous Glucose Sensor (FREESTYLE LIBRE 3 PLUS SENSOR) MISC Change sensor every 15 days. Use to check blood sugar continuously. 2 each 6   empagliflozin  (JARDIANCE ) 25 MG TABS tablet Take 1 tablet (25 mg total) by mouth daily before breakfast. 90 tablet 1   escitalopram  (LEXAPRO ) 10 MG tablet Take 1 tablet (10 mg total) by mouth daily. 90 tablet 0   famotidine  (PEPCID ) 40 MG tablet Take 1 tablet (40 mg total) by mouth daily. 30 tablet 3   fluticasone  (FLONASE ) 50 MCG/ACT nasal spray Place 2 sprays into both nostrils daily. 16 g 1   folic acid  (FOLVITE ) 1 MG tablet Take 1 tablet (1 mg total) by mouth daily. 30 tablet 4   gabapentin  (NEURONTIN ) 300 MG capsule Take 2 capsules (600 mg total) by mouth 3 (three) times daily. 180 capsule 1   glucose blood (ACCU-CHEK GUIDE TEST) test strip Use as instructed twice daily 100 each 12   glucose blood (ACCU-CHEK GUIDE) test strip Use to check blood sugar 3 times daily. E11.69 100 each 6   insulin  glargine (LANTUS  SOLOSTAR) 100 UNIT/ML Solostar Pen Inject 30 units under the skin in the morning and 14 units in the evening. 45 mL 1   Insulin  Pen Needle 32G X 4 MM MISC Use with Insulin  pen 100 each 3   methocarbamol  (ROBAXIN ) 500 MG tablet Take 1 tablet (500 mg total) by mouth every 6 (six) hours as needed for muscle spasms. 90 tablet 0   oxyCODONE  (OXYCONTIN ) 10 mg 12 hr tablet Take 1 tablet (10 mg total) by mouth every 12 (twelve) hours. 60 tablet 0   Oxycodone  HCl 10 MG TABS Take 1 tablet (10 mg total) by mouth every 6 (six) hours as needed. 60 tablet 0   sildenafil  (VIAGRA ) 100 MG tablet Take 0.5-1 tablets (50-100 mg total) by mouth daily as needed for erectile dysfunction. 5 tablet 11   No current facility-administered medications for this visit.    SURGICAL HISTORY:  Past Surgical History:  Procedure Laterality Date   APPLICATION OF ROBOTIC ASSISTANCE FOR SPINAL PROCEDURE N/A 02/27/2024   Procedure: APPLICATION OF ROBOTIC  ASSISTANCE FOR SPINAL PROCEDURE;  Surgeon: Lanis Pupa, MD;  Location: [facility] OR;  Service: Neurosurgery;  Laterality: N/A;   IR BONE TUMOR(S)RF ABLATION  08/01/2022   IR IMAGING GUIDED PORT INSERTION  09/21/2022   IR KYPHO LUMBAR INC FX REDUCE BONE BX UNI/BIL CANNULATION INC/IMAGING  08/01/2022   LAMINECTOMY WITH POSTERIOR LATERAL ARTHRODESIS LEVEL 4 N/A 02/27/2024   Procedure: Lumbar two LAMINECTOMY, Thoracic twelve-Lumbar four INSTRUMENTED STABILIZATION;  Surgeon: Lanis Pupa, MD;  Location: [facility] OR;  Service: Neurosurgery;  Laterality: N/A;    REVIEW OF SYSTEMS:   Review of Systems  Constitutional: Positive for stable fatigue and occasional diminished appetite. Negative for appetite change, chills, fever and unexpected weight change.  HENT: Negative for mouth sores, nosebleeds, sore throat and trouble swallowing.   Eyes: Negative for eye problems and icterus.  Respiratory: Negative for cough, hemoptysis, shortness of breath and wheezing.  Cardiovascular: Negative for chest pain and leg swelling.  Gastrointestinal: Negative for abdominal pain, constipation, diarrhea, nausea and vomiting.  Genitourinary: Negative for bladder incontinence, difficulty urinating, dysuria, frequency and hematuria.   Musculoskeletal:  Positive for chronic back pain (greatly improved since diagnosed). Negative for gait problem, neck pain and neck stiffness.  Skin: Negative for itching and rash.  Neurological: Negative for dizziness, extremity weakness, gait problem, headaches, light-headedness and seizures.  Hematological: Negative for adenopathy. Does not bruise/bleed easily.  Psychiatric/Behavioral: Negative for confusion, depression and sleep disturbance. The patient is not nervous/anxious.    PHYSICAL EXAMINATION:  There were no vitals taken for this visit.  ECOG PERFORMANCE STATUS: 1  Physical Exam  Constitutional: Oriented to person, place, and time and well-developed, well-nourished, and in no  distress.  HENT:  Head: Normocephalic and atraumatic.  Mouth/Throat: Oropharynx is clear and moist. No oropharyngeal exudate.  Eyes: Conjunctivae are normal. Right eye exhibits no discharge. Left eye exhibits no discharge. No scleral icterus.  Neck: Normal range of motion. Neck supple.  Cardiovascular: Normal rate, regular rhythm, normal heart sounds and intact distal pulses.   Pulmonary/Chest: Effort normal and breath sounds normal. No respiratory distress. No wheezes. No rales.  Abdominal: Soft. Bowel sounds are normal. Exhibits no distension and no mass. There is no tenderness.  Musculoskeletal: Normal range of motion. Exhibits no edema. He is wearing a back brace.  Lymphadenopathy:    No cervical adenopathy.  Neurological: Alert and oriented to person, place, and time. Exhibits normal muscle tone. Gait normal. Coordination normal.  Skin: Skin is warm and dry. No rash noted. Not diaphoretic. No erythema. No pallor.  Psychiatric: Mood, memory and judgment normal.  Vitals reviewed.  LABORATORY DATA: Lab Results  Component Value Date   WBC 6.8 06/04/2024   HGB 13.3 06/04/2024   HCT 38.7 (L) 06/04/2024   MCV 94.6 06/04/2024   PLT 243 06/04/2024      Chemistry      Component Value Date/Time   NA 139 06/04/2024 0914   NA 140 12/24/2020 1144   NA 142 08/02/2014 1649   K 4.0 06/04/2024 0914   K 4.2 08/02/2014 1649   CL 107 06/04/2024 0914   CL 109 (H) 08/02/2014 1649   CO2 27 06/04/2024 0914   CO2 21 08/02/2014 1649   BUN 14 06/04/2024 0914   BUN 7 12/24/2020 1144   BUN 11 08/02/2014 1649   CREATININE 0.58 (L) 06/04/2024 0914   CREATININE 0.92 08/02/2014 1649      Component Value Date/Time   CALCIUM  9.0 06/04/2024 0914   CALCIUM  8.6 08/02/2014 1649   ALKPHOS 80 06/04/2024 0914   ALKPHOS 82 08/02/2014 1649   AST 27 06/04/2024 0914   ALT 29 06/04/2024 0914   ALT 132 (H) 08/02/2014 1649   BILITOT 0.3 06/04/2024 0914       RADIOGRAPHIC STUDIES:  No results  found.   ASSESSMENT/PLAN:  This is a very pleasant 58 year old African-American male diagnosed with stage IV (T3, N2, M1 C) non-small cell lung cancer, adenocarcinoma.  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  He underwent biopsy and kyphoplasty to the L2 and to the brain metastasis.  He was diagnosed in September 2023.   His molecular studies showed no actionable mutations and his PD-L1 expression is 90%.  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    He underwent palliative radiation to L2 compression fracture as well as a solitary brain metastasis with SRS.    He is currently being treated with palliative systemic chemotherapy with carboplatin  for an AUC of 5, Alimta  500 mg per metered square, Keytruda  200 mg IV every 3 weeks.  He status post 31 cycles and he tolerated well without any concerning adverse side effects.  Starting from cycle #5 he started maintenance Alimta  and Keytruda .  IV every 3 weeks   Radiation oncology is monitoring his brain MRI and lumbar spine closely every few months. His next scan is due on 06/28/24  The patient was seen with Dr. Sherrod today.  Dr. Sherrod personally and independently reviewed the scan and discussed results with the patient today. The radiology report is not available at this time but Dr. Sherrod did not see obvious signs of progression. We will let the patient know if there are any concerns once the final report is available.   Labs were reviewed. Recommend he proceed with cycle #32 today as scheduled.   We will see him back for follow-up visit in 3 weeks for evaluation repeat blood work before undergoing cycle #32.   Allergic rhinitis Symptoms managed with Flonase  and  Zyrtec . - Continue Flonase  and Zyrtec .  Cancer treatment-related fatigue Increased fatigue likely from cumulative chemotherapy effects, variable in intensity. - Encourage rest on high-fatigue days. - Advise maintaining activity on good days. - Consider chemotherapy dose reduction if fatigue worsens but overall his fatigue is mild and he is able to perform his usual activities. We discusses risks and benefits. He is starting single agent alimta  after cycle #35 so hopefully he may have less fatigue with one treatment as opposed to two.  -We will check his TSH with his next lab draw.    The patient was advised to call immediately if he has any concerning symptoms in the interval. The patient voices understanding of current disease status and treatment options and is in agreement with the current care plan. All questions were answered. The patient knows to call the clinic with any problems, questions or concerns. We can certainly see the patient much sooner if necessary    No orders of the defined types were placed in this encounter.     Rayhana Slider L Ella Golomb, PA-C 06/18/24  ADDENDUM: Hematology/Oncology Attending:  I had a face-to-face encounter with the patient today.  I reviewed his records, lab, scan and recommended his care plan.  This is a very pleasant 58 years old African-American male with a stage IV non-small cell lung cancer, adenocarcinoma diagnosed in September 2023 with no actionable mutations and PD-L1 expression of 90%.  He underwent palliative radiotherapy to the L2 vertebral lesion as well as SRS to brain metastasis.  He also had L2 laminectomy with decompression.  The patient started palliative systemic chemoimmunotherapy with carboplatin , Alimta  and Keytruda  every 3 weeks for 4 cycles and starting from cycle #5 he has been on maintenance treatment with Alimta  and Keytruda  every 3 weeks status post 31 cycles.  He has been tolerating his treatment well with no concerning  adverse effects.  He had repeat CT scan of the chest, abdomen and pelvis performed recently.  I personally and independently reviewed the scan and discussed the results with the patient today.  His scan showed no concerning findings for progression. I recommended for him to continue his current treatment with maintenance  Alimta  and Keytruda  and he will proceed with cycle #32 today. I will see him back for follow-up visit in 3 weeks for evaluation before the next cycle of his treatment. The patient was advised to call immediately if he has any other concerning symptoms in the interval. The total time spent in the appointment was 30 minutes including review of chart and various tests results, discussions about plan of care and coordination of care plan . Disclaimer: This note was dictated with voice recognition software. Similar sounding words can inadvertently be transcribed and may be missed upon review. Sherrod MARLA Sherrod, MD

## 2024-06-19 ENCOUNTER — Other Ambulatory Visit (HOSPITAL_COMMUNITY): Payer: Self-pay

## 2024-06-19 ENCOUNTER — Ambulatory Visit (HOSPITAL_COMMUNITY)
Admission: RE | Admit: 2024-06-19 | Discharge: 2024-06-19 | Disposition: A | Discharge status: Home / Self Care | Source: Ambulatory Visit | Attending: Physician Assistant | Admitting: Physician Assistant

## 2024-06-19 ENCOUNTER — Other Ambulatory Visit: Payer: Self-pay | Admitting: Nurse Practitioner

## 2024-06-19 ENCOUNTER — Telehealth: Payer: Self-pay | Admitting: Family Medicine

## 2024-06-19 DIAGNOSIS — G893 Neoplasm related pain (acute) (chronic): Secondary | ICD-10-CM

## 2024-06-19 DIAGNOSIS — C3490 Malignant neoplasm of unspecified part of unspecified bronchus or lung: Secondary | ICD-10-CM | POA: Diagnosis present

## 2024-06-19 DIAGNOSIS — Z515 Encounter for palliative care: Secondary | ICD-10-CM

## 2024-06-19 MED ORDER — IOHEXOL 300 MG/ML  SOLN
100.0000 mL | Freq: Once | INTRAMUSCULAR | Status: AC | PRN
  Administered 2024-06-19: 100 mL via INTRAVENOUS

## 2024-06-19 MED ORDER — OXYCODONE HCL 10 MG PO TABS
10.0000 mg | ORAL_TABLET | Freq: Four times a day (QID) | ORAL | 0 refills | Status: DC | PRN
  Filled 2024-06-19: qty 60, 15d supply, fill #0

## 2024-06-19 MED ORDER — HEPARIN SOD (PORK) LOCK FLUSH 100 UNIT/ML IV SOLN
INTRAVENOUS | Status: AC
  Filled 2024-06-19: qty 5

## 2024-06-19 MED ORDER — HEPARIN SOD (PORK) LOCK FLUSH 100 UNIT/ML IV SOLN
500.0000 [IU] | Freq: Once | INTRAVENOUS | Status: AC
  Administered 2024-06-19: 500 [IU] via INTRAVENOUS

## 2024-06-19 MED ORDER — OXYCODONE HCL ER 10 MG PO T12A
10.0000 mg | EXTENDED_RELEASE_TABLET | Freq: Two times a day (BID) | ORAL | 0 refills | Status: DC
Start: 2024-06-22 — End: 2024-07-23
  Filled 2024-06-24: qty 60, 30d supply, fill #0
  Filled ????-??-??: fill #0

## 2024-06-19 NOTE — Telephone Encounter (Signed)
 Samuel [name] Agricultural consultant confirmed appt

## 2024-06-20 ENCOUNTER — Ambulatory Visit: Attending: Family Medicine | Admitting: Pharmacist

## 2024-06-20 ENCOUNTER — Encounter: Payer: Self-pay | Admitting: Pharmacist

## 2024-06-20 ENCOUNTER — Other Ambulatory Visit (HOSPITAL_COMMUNITY): Payer: Self-pay

## 2024-06-20 DIAGNOSIS — E1142 Type 2 diabetes mellitus with diabetic polyneuropathy: Secondary | ICD-10-CM | POA: Diagnosis not present

## 2024-06-20 DIAGNOSIS — Z794 Long term (current) use of insulin: Secondary | ICD-10-CM

## 2024-06-20 LAB — POCT GLYCOSYLATED HEMOGLOBIN (HGB A1C): HbA1c, POC (controlled diabetic range): 7.6 % — AB (ref 0.0–7.0)

## 2024-06-20 MED ORDER — LANTUS SOLOSTAR 100 UNIT/ML ~~LOC~~ SOPN
34.0000 [IU] | PEN_INJECTOR | Freq: Every day | SUBCUTANEOUS | 1 refills | Status: DC
  Filled 2024-06-20: qty 30, 88d supply, fill #0

## 2024-06-20 NOTE — Progress Notes (Signed)
 S:     No chief complaint on file.  58 y.o. male who presents for diabetes evaluation, education, and management. PMH is significant for stage IV NSCLC (currently undergoing palliative systemic chemotherapy w/ Dr. Sherrod), T2DM w/ hx of microalbuminuria & retinopathy, HLD, GAD/MDD, GERD.   Patient was last seen by Primary Care Provider, Dr. Delbert, on 05/06/2024. His insulin  dose was increased at that visit.   Dr. Newlin originally saw and referred Mr. Lahue to me on 01/18/24. At that visit, A1c was up to 10.5 (from 8.4 prior). I saw him in follow-up on 02/15/2024. At that visit with me, he endorsed hypoglycemia with Lantus  50u daily and self-decreased to 40u daily prior to seeing me. He requested to split his Lantus  and take 20u BID. We made this change. Additionally, we submitted a PA for CGM supplies and got approval.  I saw him subsequently on 03/19/2024. We increased his morning dose of Lantus  to 24 units and continued his PM dose of 20 units based on CGM report. As stated above, Dr. Newlin saw him 05/06/2024 and adjusted his insulin .   Most recently, I saw him 05/16/24. At that visit, I adjusted his Lantus  to 30 units daily and 14 units in the evening.   Today, he brings in his CGM for review and his sugars have improved since his last visit. CGM report listed below. His A1c today is 7.6 (down from 8.7 three months prior). He is having some nocturnal and early AM hypoglycemia. He wonders if we can stop his PM dose of Lantus  and continue with AM dose only.   Current diabetes medications include: Jardiance  25mg  daily, Lantus  30units in the morning/14 units at night (injecting 10 units) Patient reports adherence to taking all medications as prescribed.   Insurance coverage: Medicaid  Patient reports hypoglycemia. See CGM data below.   Patient denies nocturia (nighttime urination).  Patient denies neuropathy (nerve pain). Patient denies visual changes.  Patient reports self foot exams.    Patient reported dietary habits: Eats 3-4 meals/day Eats pretty healthy most of the time Appetite has improved since his last visit with me. He is able to eat more.  Patient-reported exercise habits: -3-4 days 1.5 hours at a time  O:  Date of Download: 05/16/2024 Average Glucose: 30-day avg: 172 mg/dL Glucose Management Indicator: n/a. Viewing Libre 3 receiver.   Glucose Variability: n/a. Viewing Libre 3 receiver.  (goal <36%) Time in Goal (30 days):  - Time in range 70-180: 35% - Time above range: 55% - Time below range: 5% Observed patterns: -Spikes and higher avg occurs between 12p-3p. -Hypoglycemia occurs overnight and in the early morning.   Lab Results  Component Value Date   HGBA1C 7.6 (A) 06/20/2024   There were no vitals filed for this visit.  Lipid Panel     Component Value Date/Time   CHOL 133 07/12/2023 1116   TRIG 64 07/12/2023 1116   HDL 41 07/12/2023 1116   CHOLHDL 3.2 07/12/2023 1116   CHOLHDL 2.4 03/11/2021 1843   VLDL 23 03/11/2021 1843   LDLCALC 79 07/12/2023 1116    Clinical Atherosclerotic Cardiovascular Disease (ASCVD): Yes  The ASCVD Risk score (Arnett DK, et al., 2019) failed to calculate for the following reasons:   Risk score cannot be calculated because patient has a medical history suggesting prior/existing ASCVD   A/P: Diabetes longstanding currently above but close to goal with A1c today of 7.6%. This is the best his A1c has looked for some time.  I commended him for this. His appetite is improving. I think what's helped him more than anything is his performance status post lower back surgery. His pain is well-managed and he is able to be active. CGM report reveals better glycemic control since my last visit with him with occasional hypoglycemia. We will condense his Lantus  to once daily and change his dose to 34 units.  -Change Lantus  to 34 units daily in the morning.  -Continued SGLT2-I Jardiance  (empagliflozin ) 10 mg. Counseled on sick  day rules. -Extensively discussed pathophysiology of diabetes, recommended lifestyle interventions, dietary effects on blood sugar control.  -Counseled on s/sx of and management of hypoglycemia.  -Next A1c anticipated 08/2024.  Written patient instructions provided. Patient verbalized understanding of treatment plan.  Total time in face to face counseling 30 minutes.    Follow-up:  Pharmacist in October. PCP: 08/06/2024  Herlene Fleeta Morris, PharmD, BCACP, CPP Clinical Pharmacist [facility] [phone]

## 2024-06-21 ENCOUNTER — Other Ambulatory Visit (HOSPITAL_COMMUNITY): Payer: Self-pay

## 2024-06-21 ENCOUNTER — Encounter: Payer: Self-pay | Admitting: Internal Medicine

## 2024-06-24 ENCOUNTER — Inpatient Hospital Stay

## 2024-06-24 ENCOUNTER — Inpatient Hospital Stay: Attending: Adult Health | Admitting: Physician Assistant

## 2024-06-24 ENCOUNTER — Other Ambulatory Visit (HOSPITAL_COMMUNITY): Payer: Self-pay

## 2024-06-24 ENCOUNTER — Telehealth: Payer: Self-pay | Admitting: Physician Assistant

## 2024-06-24 VITALS — BP 110/76 | HR 78 | Temp 97.9°F | Resp 17 | Ht 68.0 in | Wt 156.7 lb

## 2024-06-24 DIAGNOSIS — C3411 Malignant neoplasm of upper lobe, right bronchus or lung: Secondary | ICD-10-CM | POA: Insufficient documentation

## 2024-06-24 DIAGNOSIS — C7931 Secondary malignant neoplasm of brain: Secondary | ICD-10-CM | POA: Insufficient documentation

## 2024-06-24 DIAGNOSIS — Z5112 Encounter for antineoplastic immunotherapy: Secondary | ICD-10-CM | POA: Insufficient documentation

## 2024-06-24 DIAGNOSIS — C3490 Malignant neoplasm of unspecified part of unspecified bronchus or lung: Secondary | ICD-10-CM

## 2024-06-24 DIAGNOSIS — Z79899 Other long term (current) drug therapy: Secondary | ICD-10-CM | POA: Diagnosis not present

## 2024-06-24 DIAGNOSIS — J90 Pleural effusion, not elsewhere classified: Secondary | ICD-10-CM | POA: Diagnosis not present

## 2024-06-24 DIAGNOSIS — R5383 Other fatigue: Secondary | ICD-10-CM | POA: Insufficient documentation

## 2024-06-24 DIAGNOSIS — N200 Calculus of kidney: Secondary | ICD-10-CM | POA: Insufficient documentation

## 2024-06-24 DIAGNOSIS — E119 Type 2 diabetes mellitus without complications: Secondary | ICD-10-CM | POA: Insufficient documentation

## 2024-06-24 DIAGNOSIS — J432 Centrilobular emphysema: Secondary | ICD-10-CM | POA: Insufficient documentation

## 2024-06-24 DIAGNOSIS — F1020 Alcohol dependence, uncomplicated: Secondary | ICD-10-CM | POA: Diagnosis not present

## 2024-06-24 DIAGNOSIS — Z7984 Long term (current) use of oral hypoglycemic drugs: Secondary | ICD-10-CM | POA: Insufficient documentation

## 2024-06-24 DIAGNOSIS — Z5111 Encounter for antineoplastic chemotherapy: Secondary | ICD-10-CM | POA: Diagnosis not present

## 2024-06-24 DIAGNOSIS — C7951 Secondary malignant neoplasm of bone: Secondary | ICD-10-CM | POA: Diagnosis not present

## 2024-06-24 DIAGNOSIS — J309 Allergic rhinitis, unspecified: Secondary | ICD-10-CM | POA: Diagnosis not present

## 2024-06-24 LAB — CBC WITH DIFFERENTIAL (CANCER CENTER ONLY)
Abs Immature Granulocytes: 0.02 K/uL (ref 0.00–0.07)
Basophils Absolute: 0 K/uL (ref 0.0–0.1)
Basophils Relative: 0 %
Eosinophils Absolute: 0.1 K/uL (ref 0.0–0.5)
Eosinophils Relative: 1 %
HCT: 38.3 % — ABNORMAL LOW (ref 39.0–52.0)
Hemoglobin: 12.8 g/dL — ABNORMAL LOW (ref 13.0–17.0)
Immature Granulocytes: 0 %
Lymphocytes Relative: 14 %
Lymphs Abs: 1 K/uL (ref 0.7–4.0)
MCH: 31.7 pg (ref 26.0–34.0)
MCHC: 33.4 g/dL (ref 30.0–36.0)
MCV: 94.8 fL (ref 80.0–100.0)
Monocytes Absolute: 0.5 K/uL (ref 0.1–1.0)
Monocytes Relative: 7 %
Neutro Abs: 5.8 K/uL (ref 1.7–7.7)
Neutrophils Relative %: 78 %
Platelet Count: 274 K/uL (ref 150–400)
RBC: 4.04 MIL/uL — ABNORMAL LOW (ref 4.22–5.81)
RDW: 13.4 % (ref 11.5–15.5)
WBC Count: 7.5 K/uL (ref 4.0–10.5)
nRBC: 0 % (ref 0.0–0.2)

## 2024-06-24 LAB — CMP (CANCER CENTER ONLY)
ALT: 22 U/L (ref 0–44)
AST: 20 U/L (ref 15–41)
Albumin: 3.6 g/dL (ref 3.5–5.0)
Alkaline Phosphatase: 67 U/L (ref 38–126)
Anion gap: 4 — ABNORMAL LOW (ref 5–15)
BUN: 11 mg/dL (ref 6–20)
CO2: 29 mmol/L (ref 22–32)
Calcium: 8.6 mg/dL — ABNORMAL LOW (ref 8.9–10.3)
Chloride: 104 mmol/L (ref 98–111)
Creatinine: 0.66 mg/dL (ref 0.61–1.24)
GFR, Estimated: >60 mL/min (ref >60)
Glucose, Bld: 275 mg/dL — ABNORMAL HIGH (ref 70–99)
Potassium: 3.7 mmol/L (ref 3.5–5.1)
Sodium: 137 mmol/L (ref 135–145)
Total Bilirubin: 0.3 mg/dL (ref 0.0–1.2)
Total Protein: 7.2 g/dL (ref 6.5–8.1)

## 2024-06-24 MED ORDER — SODIUM CHLORIDE 0.9 % IV SOLN
200.0000 mg | Freq: Once | INTRAVENOUS | Status: AC
  Administered 2024-06-24: 200 mg via INTRAVENOUS
  Filled 2024-06-24: qty 200

## 2024-06-24 MED ORDER — SODIUM CHLORIDE 0.9 % IV SOLN
500.0000 mg/m2 | Freq: Once | INTRAVENOUS | Status: AC
  Administered 2024-06-24: 900 mg via INTRAVENOUS
  Filled 2024-06-24: qty 20

## 2024-06-24 MED ORDER — SODIUM CHLORIDE 0.9 % IV SOLN: Freq: Once | INTRAVENOUS | Status: AC

## 2024-06-24 MED ORDER — SODIUM CHLORIDE 0.9% FLUSH: 10.0000 mL | INTRAVENOUS | Status: DC | PRN

## 2024-06-24 MED ORDER — PROCHLORPERAZINE MALEATE 10 MG PO TABS
10.0000 mg | ORAL_TABLET | Freq: Once | ORAL | Status: AC
  Administered 2024-06-24: 10 mg via ORAL
  Filled 2024-06-24: qty 1

## 2024-06-24 MED ORDER — SODIUM CHLORIDE 0.9% FLUSH
10.0000 mL | Freq: Once | INTRAVENOUS | Status: AC
Start: 2024-06-24 — End: 2024-06-24
  Administered 2024-06-24: 10 mL

## 2024-06-24 NOTE — Telephone Encounter (Signed)
 I called him to review his scan.

## 2024-06-24 NOTE — Patient Instructions (Signed)
 [facility] - A DEPT OF [facility]  Discharge Instructions: Thank you for choosing [facility] to provide your oncology and hematology care.   If you have a lab appointment with the Cancer Center, please go directly to the Cancer Center and check in at the registration area.   Wear comfortable clothing and clothing appropriate for easy access to any Portacath or PICC line.   We strive to give you quality time with your provider. You may need to reschedule your appointment if you arrive late (15 or more minutes).  Arriving late affects you and other patients whose appointments are after yours.  Also, if you miss three or more appointments without notifying the office, you may be dismissed from the clinic at the provider's discretion.      For prescription refill requests, have your pharmacy contact our office and allow 72 hours for refills to be completed.    Today you received the following chemotherapy and/or immunotherapy agents: Pemetrexed , Pembrolizumab       To help prevent nausea and vomiting after your treatment, we encourage you to take your nausea medication as directed.  BELOW ARE SYMPTOMS THAT SHOULD BE REPORTED IMMEDIATELY: *FEVER GREATER THAN 100.4 F (38 C) OR HIGHER *CHILLS OR SWEATING *NAUSEA AND VOMITING THAT IS NOT CONTROLLED WITH YOUR NAUSEA MEDICATION *UNUSUAL SHORTNESS OF BREATH *UNUSUAL BRUISING OR BLEEDING *URINARY PROBLEMS (pain or burning when urinating, or frequent urination) *BOWEL PROBLEMS (unusual diarrhea, constipation, pain near the anus) TENDERNESS IN MOUTH AND THROAT WITH OR WITHOUT PRESENCE OF ULCERS (sore throat, sores in mouth, or a toothache) UNUSUAL RASH, SWELLING OR PAIN  UNUSUAL VAGINAL DISCHARGE OR ITCHING   Items with * indicate a potential emergency and should be followed up as soon as possible or go to the Emergency Department if any problems should occur.  Please show the CHEMOTHERAPY ALERT CARD  or IMMUNOTHERAPY ALERT CARD at check-in to the Emergency Department and triage nurse.  Should you have questions after your visit or need to cancel or reschedule your appointment, please contact [facility]  Dept: [phone]  and follow the prompts.  Office hours are 8:00 a.m. to 4:30 p.m. Monday - Friday. Please note that voicemails left after 4:00 p.m. may not be returned until the following business day.  We are closed weekends and major holidays. You have access to a nurse at all times for urgent questions. Please call the main number to the clinic Dept: [phone] and follow the prompts.   For any non-urgent questions, you may also contact your provider using MyChart. We now offer e-Visits for anyone 59 and older to request care online for non-urgent symptoms. For details visit [email or web address].   Also download the MyChart app! Go to the app store, search MyChart, open the app, select [facility], and log in with your MyChart username and password.

## 2024-06-25 ENCOUNTER — Ambulatory Visit (HOSPITAL_COMMUNITY)

## 2024-06-27 ENCOUNTER — Encounter (HOSPITAL_COMMUNITY): Payer: Self-pay | Admitting: Licensed Clinical Social Worker

## 2024-06-27 ENCOUNTER — Ambulatory Visit (HOSPITAL_COMMUNITY): Admitting: Licensed Clinical Social Worker

## 2024-06-27 DIAGNOSIS — F411 Generalized anxiety disorder: Secondary | ICD-10-CM

## 2024-06-27 DIAGNOSIS — F332 Major depressive disorder, recurrent severe without psychotic features: Secondary | ICD-10-CM

## 2024-06-27 DIAGNOSIS — F322 Major depressive disorder, single episode, severe without psychotic features: Secondary | ICD-10-CM

## 2024-06-27 NOTE — Progress Notes (Signed)
 THERAPIST PROGRESS NOTE  Virtual Visit via Video Note  I connected with Samuel [name] on 06/27/24 at  8:00 AM EDT by a video enabled telemedicine application and verified that I am speaking with the correct person using two identifiers.  Location: Patient: [facility]  Provider: Providers Home office    I discussed the limitations of evaluation and management by telemedicine and the availability of in person appointments. The patient expressed understanding and agreed to proceed.     I discussed the assessment and treatment plan with the patient. The patient was provided an opportunity to ask questions and all were answered. The patient agreed with the plan and demonstrated an understanding of the instructions.   The patient was advised to call back or seek an in-person evaluation if the symptoms worsen or if the condition fails to improve as anticipated.  I provided 30 minutes of non-face-to-face time during this encounter.   Samuel GORMAN Patee, LCSW   Participation Level: Active  Behavioral Response: CasualAlertAnxious and Depressed  Type of Therapy: Individual Therapy  Treatment Goals addressed:   ProgressTowards Goals: Progressing  Interventions: CBT, Motivational Interviewing, and Supportive  Summary: Samuel [name] is a 58 y.o. male who presents with   Suicidal/Homicidal: Nowithout intent/plan  Therapist Response:   Patient was alert and oriented x 5.  Samuel [name] was pleasant, cooperative, maintained good eye contact.  Pt engaged well in therapy session was dressed casually.  Patient presented with euthymic mood\affect.  Patient comes in today stating that everything is going well.  This is as evidenced by chemo treatment going well.  He reports overall decrease in mental health symptoms for tension, worry, worthlessness and hopelessness.  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  This will increase his opportunities for social interaction, working out, and other enjoyment activities.  Patient reports no new stressors at this time.  Samuel [name] states that he has been taking his medications as directed and consistently.  This is as evidenced by GAD-7 and PHQ-9 going below a 5 in today's session.  Intervention/plan: Plan for patient looking forward to discharge from therapy due to goals completed and above treatment plan.  Patient was provided information for [facility] to reestablish care for therapeutic services if needed.  Patient will continue with medication management team at [name].  Samuel [name] verbally agreeable to plan.  LCSW utilized interventions today for administering the PHQ-9.  LCSW administrative GAD-7.  LCSW reviewed those course with patient.  LCSW utilized psychoanalytic therapy for patient to express thoughts, feelings and emotions and nonjudgmental environment.  LCSW utilized open-ended questions and validated feelings in session.  LCSW utilized supportive therapy for praise and encouragement.     Diagnosis: GAD (generalized anxiety disorder)  Major depressive disorder, severe (HCC)  Collaboration of Care: Other Continued medication management at [facility]   Patient/Guardian was advised Release of Information must be obtained prior to any record release in order to collaborate their care with an outside provider. Patient/Guardian was advised if they have not already done so to contact the registration department to sign all necessary forms in order for us  to release information regarding their care.   Consent: Patient/Guardian gives verbal consent for treatment and assignment of benefits for services provided during this visit. Patient/Guardian expressed understanding and agreed to proceed.   Samuel GORMAN Patee, LCSW 06/27/2024

## 2024-06-28 ENCOUNTER — Ambulatory Visit (HOSPITAL_COMMUNITY)

## 2024-07-01 ENCOUNTER — Inpatient Hospital Stay

## 2024-07-03 ENCOUNTER — Ambulatory Visit: Admitting: Urology

## 2024-07-05 ENCOUNTER — Ambulatory Visit: Payer: Self-pay | Admitting: Radiation Oncology

## 2024-07-05 ENCOUNTER — Ambulatory Visit (HOSPITAL_COMMUNITY)
Admission: RE | Admit: 2024-07-05 | Discharge: 2024-07-05 | Disposition: A | Discharge status: Home / Self Care | Source: Ambulatory Visit | Attending: Radiation Oncology | Admitting: Radiation Oncology

## 2024-07-05 DIAGNOSIS — C7931 Secondary malignant neoplasm of brain: Secondary | ICD-10-CM | POA: Insufficient documentation

## 2024-07-05 MED ORDER — GADOBUTROL 1 MMOL/ML IV SOLN
7.0000 mL | Freq: Once | INTRAVENOUS | Status: AC | PRN
  Administered 2024-07-05: 7 mL via INTRAVENOUS

## 2024-07-06 ENCOUNTER — Other Ambulatory Visit: Payer: Self-pay | Admitting: Nurse Practitioner

## 2024-07-08 ENCOUNTER — Encounter: Payer: Self-pay | Admitting: Internal Medicine

## 2024-07-08 ENCOUNTER — Other Ambulatory Visit: Payer: Self-pay

## 2024-07-08 ENCOUNTER — Encounter

## 2024-07-08 ENCOUNTER — Other Ambulatory Visit: Payer: Self-pay | Admitting: Internal Medicine

## 2024-07-08 ENCOUNTER — Other Ambulatory Visit (HOSPITAL_COMMUNITY): Payer: Self-pay

## 2024-07-08 DIAGNOSIS — C3490 Malignant neoplasm of unspecified part of unspecified bronchus or lung: Secondary | ICD-10-CM

## 2024-07-08 MED ORDER — OXYCODONE HCL 10 MG PO TABS
10.0000 mg | ORAL_TABLET | Freq: Four times a day (QID) | ORAL | 0 refills | Status: DC | PRN
  Filled 2024-07-08: qty 60, 15d supply, fill #0

## 2024-07-10 ENCOUNTER — Other Ambulatory Visit: Payer: Self-pay | Admitting: Radiation Therapy

## 2024-07-10 ENCOUNTER — Ambulatory Visit
Admission: RE | Admit: 2024-07-10 | Discharge: 2024-07-10 | Disposition: A | Discharge status: Home / Self Care | Source: Ambulatory Visit | Attending: Urology | Admitting: Urology

## 2024-07-10 ENCOUNTER — Encounter: Payer: Self-pay | Admitting: Urology

## 2024-07-10 DIAGNOSIS — C7931 Secondary malignant neoplasm of brain: Secondary | ICD-10-CM

## 2024-07-10 NOTE — Progress Notes (Signed)
 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  Interval Since Last Radiation:  1 year, 4 months 08/17/2022 through 08/30/2022 Site Technique Total Dose (Gy) Dose per Fx (Gy) Completed Fx Beam Energies  Lumbar Spine: Spine 3D 30/30 3 10/10 10X, 15X  Brain: Brain_SRS IMRT 20/20 20 1/1 6XFFF   Narrative:  I spoke with the patient to conduct his routine scheduled 3 month follow up visit to review results of his recent MRI brain scan via telephone to spare the patient unnecessary potential exposure in the healthcare setting.  The patient was notified in advance and gave permission to proceed with this visit format.  He tolerated radiation therapy very well and remains without complaints. He has continued to tolerate the systemic therapy well and completed 4 cycles of the carboplatin /Alimta /Keytruda  before transitioning to maintenance therapy with Alimta /Keytruda  only, starting from cycle 5. He feels like his energy and strength have significantly improved since coming off the carboplatinin and he has been able to gradually increase his activity level which he is pleased with.   MRI of the lumbar and thoracic spine were performed 06/09/23 and showed stability of the treated metastatic disease in the lumbar spine (L2) and an overall unchanged appearance of the metastatic disease in the thoracic and lumbar spine compared to the MRI from 03/01/2023 with no new lesions or epidural soft tissue tumor identified. However, he became more symptomatic with some occasional numbness/tingling in his lower extremities and mild-moderate back pain in the mid to lower back that was becoming less well-controlled  with pain medications. Therefore, we referred him for evaluation with neurosurgery and a repeat lumbar spine MRI from 01/04/24 demonstrated previous near vertebra plana L2 with previous vertebral augmentation. There was a portion of retropulsed bone contributing to moderate stenosis at this level with some slight reversal of the normal lumbar lordosis focused at this level. Dynamic thoracolumbar spine x-rays done in the neurosurgery office demonstrated exaggerated focal kyphosis centered about the L2 fracture, most notable comparing upright extended to flexed x-rays. He subsequently underwent L2 decompression and instrumented stabilization of T12-L4 under the care of Dr. Lanis and Dr. Colon on 02/27/24. He has recovered well from the procedure and reports significant improvement in the back pain and resolution of the paraesthesias in the LEs. He is regaining strength in the LEs daily, now able to work out in the gym more consistently which he is very pleased with.  His most recent restaging CT C/A/P from 06/19/24 continues to show an excellent response to treatment with no evidence for recurrence or extraosseous metastatic disease in the thorax, abdomen or pelvis and a stable appearance of the osseous metastatic disease in the thoracolumbar spine. The plan is to continue his current systemic therapy and he has a follow up visit with Dr. Sherrod on 07/15/24.   He had a recent follow up MRI brain scan on 07/05/24 that showed re-enhancement of small treated posterior cerebral hemisphere metastases, both appearing more nodular and indistinct than at the  time of treatment, and both with mild associated T2/FLAIR hyperintensity, suggestive of radiation necrosis.  There is a NEW  patchy area of left superior cerebellar folia or meningeal  enhancement that is in close proximity to the treated left medial posterior cerebral lesion (both are visible on series 10, image 36), most likely treatment effect but small  new/progressed brain metastases are difficult to exclude so the recommendation is to closely monitor with a repeat MRI brain scan in 2-3 months to re-evaluate and review in the multidisciplinary brain conference. We reviewed these results by telephone today.  On review of systems, the patient states that he is doing well in general. He reports significant improvement in his back pain and resolution of paraesthesias in the LEs since the time of his decompression procedure in 02/2024. He continues using a back brace for support and has been able to workout at [employer] more consistently to help with stretching, strength and balance.  He has not noticed any change in his bowel or bladder habits.  He does have occasional muscle cramps/tightness in the low back, at the surgical site, particularly in the mornings but these usually resolve spontaneously throughout the day with repositioning, stretching and massage. He has noticed some increased tearing of his eyes and some increased fatigue but denies headaches, nausea, vomiting, dizziness, slurred speech, changes in visual or auditory acuity or tremors.  Overall, he is quite pleased with his progress to date.  ALLERGIES:  is allergic to penicillins and percocet [oxycodone -acetaminophen ].  Meds: Current Outpatient Medications  Medication Sig Dispense Refill   acetaminophen  (TYLENOL ) 500 MG tablet Take 1,000 mg by mouth every 6 (six) hours as needed for moderate pain (pain score 4-6).     atorvastatin  (LIPITOR) 40 MG tablet Take 1 tablet (40 mg total) by mouth daily. 90 tablet 1   buPROPion  (WELLBUTRIN  XL) 150 MG 24 hr tablet Take 1 tablet (150 mg total) by mouth daily. 90 tablet 0   cetirizine  (ZYRTEC ) 10 MG tablet Take 1 tablet (10 mg total) by mouth daily. 30 tablet 1   Continuous Glucose Receiver (FREESTYLE LIBRE 3 READER) DEVI Use to check blood sugar continuously throughout the day. 1 each 0   Continuous Glucose Sensor (FREESTYLE LIBRE 3 PLUS SENSOR)  MISC Change sensor every 15 days. Use to check blood sugar continuously. 2 each 6   empagliflozin  (JARDIANCE ) 25 MG TABS tablet Take 1 tablet (25 mg total) by mouth daily before breakfast. 90 tablet 1   escitalopram  (LEXAPRO ) 10 MG tablet Take 1 tablet (10 mg total) by mouth daily. 90 tablet 0   famotidine  (PEPCID ) 40 MG tablet Take 1 tablet (40 mg total) by mouth daily. 30 tablet 3   fluticasone  (FLONASE ) 50 MCG/ACT nasal spray Place 2 sprays into both nostrils daily. 16 g 1   folic acid  (FOLVITE ) 1 MG tablet Take 1 tablet (1 mg total) by mouth daily. 30 tablet 4   gabapentin  (NEURONTIN ) 300 MG capsule Take 2 capsules (600 mg total) by mouth 3 (three) times daily. 180 capsule 1   glucose blood (ACCU-CHEK GUIDE TEST) test strip Use as instructed twice daily 100 each 12   glucose blood (ACCU-CHEK GUIDE) test strip Use to check blood sugar 3 times daily. E11.69 100 each 6   insulin  glargine (LANTUS  SOLOSTAR) 100 UNIT/ML Solostar Pen Inject 34 Units into the skin daily. 30 mL 1   Insulin  Pen Needle 32G X 4 MM MISC Use with Insulin  pen 100 each 3   methocarbamol  (ROBAXIN ) 500 MG tablet Take 1 tablet (500 mg total) by mouth every 6 (six) hours as needed for muscle spasms. 90 tablet 0   oxyCODONE  (OXYCONTIN ) 10 mg 12  hr tablet Take 1 tablet (10 mg total) by mouth every 12 (twelve) hours. 60 tablet 0   Oxycodone  HCl 10 MG TABS Take 1 tablet (10 mg total) by mouth every 6 (six) hours as needed. 60 tablet 0   sildenafil  (VIAGRA ) 100 MG tablet Take 0.5-1 tablets (50-100 mg total) by mouth daily as needed for erectile dysfunction. 5 tablet 11   No current facility-administered medications for this encounter.    Physical Findings:  vitals were not taken for this visit.  Pain Assessment Pain Score: 2 /10 Unable to assess due to telephone follow-up visit format.  Lab Findings: Lab Results  Component Value Date   WBC 7.5 06/24/2024   HGB 12.8 (L) 06/24/2024   HCT 38.3 (L) 06/24/2024   MCV 94.8  06/24/2024   PLT 274 06/24/2024     Radiographic Findings: MR Brain W Wo Contrast Result Date: 07/05/2024 CLINICAL DATA:  58 year old male with non-small cell lung cancer with a proximally 3 small treated brain metastases 2020 01/2023. Restaging. EXAM: MRI HEAD WITHOUT AND WITH CONTRAST TECHNIQUE: Multiplanar, multiecho pulse sequences of the brain and surrounding structures were obtained without and with intravenous contrast. CONTRAST:  7mL GADAVIST  GADOBUTROL  1 MMOL/ML IV SOLN COMPARISON:  Brain MRI 03/25/2024 and earlier. FINDINGS: Brain: Small focus of susceptibility with punctate enhancement at treated inferior right cerebellar site (series 1200, image 125). No edema. Medial left posterior temporal lobe/junction with occipital lobe lesion which was identified on 08/05/2022 is enhancing again on series 1200, image 166 and series 11, image 25 now. This was faintly visible on May postcontrast images, not identified in January. Faint associated T2 and FLAIR hyperintensity there (series 5, image 23) without mass effect. Furthermore, treated right occipital enhancing metastasis also visible again, series 1200, image 175, with similar faint T2/FLAIR hyperintensity. And both of these posterior hemisphere appear somewhat more nodular and indistinct than at the time of presentation. And additionally, there is subtle abnormal enhancement of the left superior cerebellum across the tentorium from the posterior left hemisphere lesion, best seen on series 10, image 36 but also apparent on series 1200, image 158. This has a similar nodular and indistinct appearance. No associated left superior cerebellar edema or mass effect. No other abnormal intracranial enhancement or dural thickening identified. No superimposed restricted diffusion to suggest acute infarction. No midline shift, mass effect, ventriculomegaly, extra-axial collection or acute intracranial hemorrhage. Cervicomedullary junction and pituitary are within  normal limits. Punctate chronic cerebral blood products at the right occipital lobe white matter on series 6, image 64 unchanged. Elsewhere normal gray and white matter signal. Vascular: Major intracranial vascular flow voids are stable. Skull and upper cervical spine: Visualized bone marrow signal is within normal limits. Negative visible cervical spine. Sinuses/Orbits: Stable.  Mild paranasal sinus mucosal thickening. Other: Trace mastoid effusions. Benign left occipital scalp lipoma redemonstrated. IMPRESSION: 1. Re-enhancement of small treated posterior cerebral hemisphere metastases, both appearing more nodular and indistinct than at the time of treatment, and both with mild associated T2/FLAIR hyperintensity. 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 Small new/progressed brain metastases difficult to exclude. Recommend repeat MRI without and with contrast in 2-3 months to re-evaluate. 2. Treated right cerebellar metastasis remains stable with no new signal abnormality. Electronically Signed   By: VEAR Hurst M.D.   On: 07/05/2024  11:39   CT CHEST ABDOMEN PELVIS W CONTRAST Result Date: 06/24/2024 CLINICAL DATA:  Non-small-cell lung cancer. Restaging. * Tracking Code: BO * EXAM: CT CHEST, ABDOMEN, AND PELVIS WITH CONTRAST TECHNIQUE: Multidetector CT imaging of the chest, abdomen and pelvis was performed following the standard protocol during bolus administration of intravenous contrast. RADIATION DOSE REDUCTION: This exam was performed according to the departmental dose-optimization program which includes automated exposure control, adjustment of the mA and/or kV according to patient size and/or use of iterative reconstruction technique. CONTRAST:  OMNIPAQUE  IOHEXOL  300 MG/ML  SOLN COMPARISON:   03/26/2024 FINDINGS: CT CHEST FINDINGS Cardiovascular: Port-A-Cath tip at high right atrium. Bovine arch. Aortic atherosclerosis. Normal heart size, without pericardial effusion. No central pulmonary embolism, on this non-dedicated study. Mediastinum/Nodes: No supraclavicular adenopathy. No mediastinal or hilar adenopathy. Esophageal fluid level on 34/2. Lungs/Pleura: No pleural fluid. Paraseptal and centrilobular emphysema. No suspicious pulmonary nodule or mass. Musculoskeletal: Included within the abdomen pelvic section. CT ABDOMEN PELVIS FINDINGS Hepatobiliary: Beam hardening artifact from thoracolumbar spine fixation. Normal liver. Normal gallbladder, without biliary ductal dilatation. Pancreas: Normal, without mass or ductal dilatation. Spleen: Normal in size, without focal abnormality. Adrenals/Urinary Tract: Normal left adrenal gland. Right adrenal thickening mild nodularity are unchanged. Normal left kidney. Punctate right renal collecting system calculi without hydronephrosis. Normal urinary bladder. Stomach/Bowel: Normal stomach, without wall thickening. Colonic stool burden suggests constipation. Normal terminal ileum and appendix. Normal small bowel. Vascular/Lymphatic: Advanced aortic and branch vessel atherosclerosis. No abdominopelvic adenopathy. Reproductive: Normal prostate. Other: No significant free fluid. No free intraperitoneal air. No evidence of omental or peritoneal disease. Musculoskeletal: T12 through L4 trans pedicle screw fixation. Underlying severe L2 compression deformity, status post vertebral augmentation with ventral canal encroachment. Similar underlying sclerosis which could be posttraumatic or metastatic. Lytic lesion within the right-side of the L3 vertebral body is relatively similar at 2.1 cm on sagittal image 125. Old bilateral rib fractures. Eccentric right T3 somewhat ill-defined sclerosis is similar including on sagittal image 127. IMPRESSION: 1. Similar osseous metastasis  including within T3 and L3. 2. No evidence of extraosseous metastatic disease. Mildly limited evaluation of the abdomen secondary to beam hardening artifact from thoracolumbar spine fixation. 3. Similar right adrenal thickening and mild nodularity are nonspecific and warrant follow-up attention. 4. Incidental findings, including: Aortic atherosclerosis (ICD10-I70.0) and emphysema (ICD10-J43.9). Right nephrolithiasis. Possible constipation. Esophageal air fluid level suggests dysmotility or gastroesophageal reflux. Electronically Signed   By: Rockey Kilts M.D.   On: 06/24/2024 12:15    Impression/Plan: 1. 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  He has recovered well from the effects of the radiation.  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  Recommendation is for continued close monitoring with a repeat MRI scan in 3 months and review in the multidisciplinary brain and spine conference.  I will plan to follow-up with him by telephone thereafter to review results and recommendations from the multidisciplinary brain and spine conference. He will also continue in routine follow up with Dr. Sherrod, for continued management of his systemic disease and is scheduled for a follow up visit with him on 07/15/24. He has a good understanding of our recommendations and is comfortable and in agreement with the stated plan.  Regarding the metastatic disease in the spine, he will continue in follow up with neurosurgery and if  there is any clinical indication for additional radiation in the future, we are happy to offer further treatment. We will plan to connect again following his MRI brain scan in 09/2024 but he knows to call with any questions or concerns in the interim.  I personally spent 30 minutes in this encounter including chart  review, reviewing radiological studies, telephone conversation with the patient, entering orders and completing documentation.    Sabra MICAEL Rusk, PA-C

## 2024-07-10 NOTE — Progress Notes (Signed)
 Samuel [name] is contacted via phone to discuss his recent MRI Brain scan 07/05/2024.  He completed radiation therapy to his Lumbar Spine and Brain October 2023.   Does the patient complain of any of the following: Headache: Denies. Visual Changes: Denies visual changes. Hearing Changes:  Nausea: Denies Vomiting: Denies. Balance or coordination issues: Denies. Memory issues: Denies. He reports his eyes are tearing up more lately.  He has seen his eye provider. Skin: He reports his skin to the treated area is doing well, no complaints or concerns noted.    Additional comments if applicable:

## 2024-07-11 ENCOUNTER — Other Ambulatory Visit: Payer: Self-pay | Admitting: Radiation Therapy

## 2024-07-11 DIAGNOSIS — C7931 Secondary malignant neoplasm of brain: Secondary | ICD-10-CM

## 2024-07-15 ENCOUNTER — Encounter: Payer: Self-pay | Admitting: Medical Oncology

## 2024-07-15 ENCOUNTER — Inpatient Hospital Stay

## 2024-07-15 ENCOUNTER — Inpatient Hospital Stay (HOSPITAL_BASED_OUTPATIENT_CLINIC_OR_DEPARTMENT_OTHER): Admitting: Internal Medicine

## 2024-07-15 VITALS — BP 113/83 | HR 74 | Temp 97.6°F | Resp 17 | Ht 68.0 in | Wt 155.0 lb

## 2024-07-15 DIAGNOSIS — C3490 Malignant neoplasm of unspecified part of unspecified bronchus or lung: Secondary | ICD-10-CM

## 2024-07-15 DIAGNOSIS — C3491 Malignant neoplasm of unspecified part of right bronchus or lung: Secondary | ICD-10-CM | POA: Diagnosis not present

## 2024-07-15 DIAGNOSIS — Z5112 Encounter for antineoplastic immunotherapy: Secondary | ICD-10-CM | POA: Diagnosis not present

## 2024-07-15 DIAGNOSIS — C7931 Secondary malignant neoplasm of brain: Secondary | ICD-10-CM

## 2024-07-15 LAB — CMP (CANCER CENTER ONLY)
ALT: 30 U/L (ref 0–44)
AST: 30 U/L (ref 15–41)
Albumin: 3.7 g/dL (ref 3.5–5.0)
Alkaline Phosphatase: 77 U/L (ref 38–126)
Anion gap: 5 (ref 5–15)
BUN: 12 mg/dL (ref 6–20)
CO2: 26 mmol/L (ref 22–32)
Calcium: 8.6 mg/dL — ABNORMAL LOW (ref 8.9–10.3)
Chloride: 107 mmol/L (ref 98–111)
Creatinine: 0.55 mg/dL — ABNORMAL LOW (ref 0.61–1.24)
GFR, Estimated: >60 mL/min (ref >60)
Glucose, Bld: 95 mg/dL (ref 70–99)
Potassium: 3.9 mmol/L (ref 3.5–5.1)
Sodium: 138 mmol/L (ref 135–145)
Total Bilirubin: 0.3 mg/dL (ref 0.0–1.2)
Total Protein: 7 g/dL (ref 6.5–8.1)

## 2024-07-15 LAB — TSH: TSH: 0.862 u[IU]/mL (ref 0.350–4.500)

## 2024-07-15 LAB — CBC WITH DIFFERENTIAL (CANCER CENTER ONLY)
Abs Immature Granulocytes: 0.02 K/uL (ref 0.00–0.07)
Basophils Absolute: 0 K/uL (ref 0.0–0.1)
Basophils Relative: 0 %
Eosinophils Absolute: 0.1 K/uL (ref 0.0–0.5)
Eosinophils Relative: 2 %
HCT: 37.8 % — ABNORMAL LOW (ref 39.0–52.0)
Hemoglobin: 12.7 g/dL — ABNORMAL LOW (ref 13.0–17.0)
Immature Granulocytes: 0 %
Lymphocytes Relative: 17 %
Lymphs Abs: 1.3 K/uL (ref 0.7–4.0)
MCH: 31.8 pg (ref 26.0–34.0)
MCHC: 33.6 g/dL (ref 30.0–36.0)
MCV: 94.5 fL (ref 80.0–100.0)
Monocytes Absolute: 0.5 K/uL (ref 0.1–1.0)
Monocytes Relative: 7 %
Neutro Abs: 5.6 K/uL (ref 1.7–7.7)
Neutrophils Relative %: 74 %
Platelet Count: 288 K/uL (ref 150–400)
RBC: 4 MIL/uL — ABNORMAL LOW (ref 4.22–5.81)
RDW: 13.6 % (ref 11.5–15.5)
WBC Count: 7.6 K/uL (ref 4.0–10.5)
nRBC: 0 % (ref 0.0–0.2)

## 2024-07-15 MED ORDER — SODIUM CHLORIDE 0.9 % IV SOLN: Freq: Once | INTRAVENOUS | Status: AC

## 2024-07-15 MED ORDER — SODIUM CHLORIDE 0.9 % IV SOLN
500.0000 mg/m2 | Freq: Once | INTRAVENOUS | Status: AC
  Administered 2024-07-15: 900 mg via INTRAVENOUS
  Filled 2024-07-15: qty 20

## 2024-07-15 MED ORDER — PROCHLORPERAZINE MALEATE 10 MG PO TABS
10.0000 mg | ORAL_TABLET | Freq: Once | ORAL | Status: AC
  Administered 2024-07-15: 10 mg via ORAL
  Filled 2024-07-15: qty 1

## 2024-07-15 MED ORDER — CYANOCOBALAMIN 1000 MCG/ML IJ SOLN
1000.0000 ug | Freq: Once | INTRAMUSCULAR | Status: AC
  Administered 2024-07-15: 1000 ug via INTRAMUSCULAR
  Filled 2024-07-15: qty 1

## 2024-07-15 MED ORDER — SODIUM CHLORIDE 0.9% FLUSH
10.0000 mL | Freq: Once | INTRAVENOUS | Status: AC
  Administered 2024-07-15: 10 mL

## 2024-07-15 MED ORDER — SODIUM CHLORIDE 0.9 % IV SOLN
200.0000 mg | Freq: Once | INTRAVENOUS | Status: AC
  Administered 2024-07-15: 200 mg via INTRAVENOUS
  Filled 2024-07-15: qty 200

## 2024-07-15 NOTE — Patient Instructions (Signed)
 [facility] - A DEPT OF [facility]  Discharge Instructions: Thank you for choosing [facility] to provide your oncology and hematology care.   If you have a lab appointment with the Cancer Center, please go directly to the Cancer Center and check in at the registration area.   Wear comfortable clothing and clothing appropriate for easy access to any Portacath or PICC line.   We strive to give you quality time with your provider. You may need to reschedule your appointment if you arrive late (15 or more minutes).  Arriving late affects you and other patients whose appointments are after yours.  Also, if you miss three or more appointments without notifying the office, you may be dismissed from the clinic at the provider's discretion.      For prescription refill requests, have your pharmacy contact our office and allow 72 hours for refills to be completed.    Today you received the following chemotherapy and/or immunotherapy agents keytruda, alimta      To help prevent nausea and vomiting after your treatment, we encourage you to take your nausea medication as directed.  BELOW ARE SYMPTOMS THAT SHOULD BE REPORTED IMMEDIATELY: *FEVER GREATER THAN 100.4 F (38 C) OR HIGHER *CHILLS OR SWEATING *NAUSEA AND VOMITING THAT IS NOT CONTROLLED WITH YOUR NAUSEA MEDICATION *UNUSUAL SHORTNESS OF BREATH *UNUSUAL BRUISING OR BLEEDING *URINARY PROBLEMS (pain or burning when urinating, or frequent urination) *BOWEL PROBLEMS (unusual diarrhea, constipation, pain near the anus) TENDERNESS IN MOUTH AND THROAT WITH OR WITHOUT PRESENCE OF ULCERS (sore throat, sores in mouth, or a toothache) UNUSUAL RASH, SWELLING OR PAIN  UNUSUAL VAGINAL DISCHARGE OR ITCHING   Items with * indicate a potential emergency and should be followed up as soon as possible or go to the Emergency Department if any problems should occur.  Please show the CHEMOTHERAPY ALERT CARD or  IMMUNOTHERAPY ALERT CARD at check-in to the Emergency Department and triage nurse.  Should you have questions after your visit or need to cancel or reschedule your appointment, please contact [facility] - A DEPT [facility]  Dept: [phone]  and follow the prompts.  Office hours are 8:00 a.m. to 4:30 p.m. Monday - Friday. Please note that voicemails left after 4:00 p.m. may not be returned until the following business day.  We are closed weekends and major holidays. You have access to a nurse at all times for urgent questions. Please call the main number to the clinic Dept: [phone] and follow the prompts.   For any non-urgent questions, you may also contact your provider using MyChart. We now offer e-Visits for anyone 51 and older to request care online for non-urgent symptoms. For details visit [email or web address].   Also download the MyChart app! Go to the app store, search "MyChart", open the app, select [place], and log in with your MyChart username and password.

## 2024-07-15 NOTE — Progress Notes (Signed)
 [facility] [phone]   [phone]  OFFICE PROGRESS NOTE  Samuel Clam, MD [address] 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  Biomarker Findings Microsatellite status - MS-Stable Tumor Mutational Burden - 8 Muts/Mb Genomic Findings For a complete list of the genes assayed, please refer to the Appendix. KEAP1 Q227* CDKN2A/B [id number] G89V PRKCI amplification TERC amplification - equivocal? TP53 [id number] 8 Disease relevant genes with no reportable alterations: ALK, BRAF, EGFR, ERBB2, KRAS, MET, RET, ROS1  PDL1 TPS  90%  PRIOR THERAPY:  1) Status post palliative radiotherapy to the L2 lesion under the care of Dr. Patrcia. 2) Status post SRS to solitary brain metastasis under the care of Dr. Patrcia. 3) status post L2 laminectomy for decompression of the thecal sac under the care of Dr. Lanis on 02/27/2024.  CURRENT THERAPY: Palliative systemic therapy with carboplatin  for AUC of 5, Alimta  500 Mg/M2 and Keytruda  200 Mg IV every 3 weeks.  First dose September 05, 2022.  Status post 32 cycles.  Starting from cycle #5 the patient will be on maintenance treatment with Alimta  and Keytruda  every 3 weeks.  INTERVAL HISTORY: Samuel [name] 58 y.o. male returns to the clinic today for follow-up visit.Discussed the use of AI scribe software for clinical note transcription with the patient, who gave verbal consent to proceed.  History of Present Illness Samuel [name] is a 58 year old male with stage four lung cancer who presents for evaluation before starting  cycle number thirty-three of maintenance Alimta  and Keytruda .  He is currently undergoing maintenance therapy with Alimta  and Keytruda  every three weeks, having completed a total of thirty-two cycles. He is here for evaluation before starting cycle number thirty-three.  He feels 'a little tired' but has no new symptoms over the weekend. No chest pain, breathing issues, nausea, vomiting, diarrhea, or headaches.  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     MEDICAL HISTORY: Past Medical History:  Diagnosis Date   Alcohol  use disorder, severe, dependence (HCC) 12/26/2016   Anxiety    Depression    Diabetes mellitus without complication (HCC)    Metastatic cancer to brain (HCC) 08/05/2022   Metastatic cancer to spine (HCC) 07/19/2022   Pancreatitis unk   Primary lung adenocarcinoma (HCC) 08/05/2022   Suicidal ideation    Type 2 diabetes mellitus with hyperglycemia, without long-term current use of insulin  (HCC) 06/13/2022   Formatting of this note might be different from the original. 06/13/2022 A1C 13.8, FSBG 414 Start empagliflozin  5mg /metformin  1000 daily, levemir 20 qhs. (Samples given) Will apply for medassist for pharmacy    ALLERGIES:  is allergic to penicillins and percocet [oxycodone -acetaminophen ].  MEDICATIONS:  Current Outpatient Medications  Medication Sig Dispense Refill   acetaminophen  (TYLENOL ) 500 MG tablet Take 1,000 mg by mouth every 6 (six) hours as needed for moderate pain (pain score 4-6).     atorvastatin  (LIPITOR) 40 MG tablet Take 1 tablet (40 mg total) by mouth daily. 90 tablet 1   buPROPion  (WELLBUTRIN  XL) 150 MG 24 hr  tablet Take 1 tablet (150 mg total) by mouth daily. 90 tablet 0   cetirizine  (ZYRTEC ) 10 MG tablet Take 1 tablet (10 mg total) by mouth daily. 30 tablet 1   Continuous Glucose Receiver (FREESTYLE LIBRE 3 READER) DEVI Use to check blood sugar  continuously throughout the day. 1 each 0   Continuous Glucose Sensor (FREESTYLE LIBRE 3 PLUS SENSOR) MISC Change sensor every 15 days. Use to check blood sugar continuously. 2 each 6   empagliflozin  (JARDIANCE ) 25 MG TABS tablet Take 1 tablet (25 mg total) by mouth daily before breakfast. 90 tablet 1   escitalopram  (LEXAPRO ) 10 MG tablet Take 1 tablet (10 mg total) by mouth daily. 90 tablet 0   famotidine  (PEPCID ) 40 MG tablet Take 1 tablet (40 mg total) by mouth daily. 30 tablet 3   fluticasone  (FLONASE ) 50 MCG/ACT nasal spray Place 2 sprays into both nostrils daily. 16 g 1   folic acid  (FOLVITE ) 1 MG tablet Take 1 tablet (1 mg total) by mouth daily. 30 tablet 4   gabapentin  (NEURONTIN ) 300 MG capsule Take 2 capsules (600 mg total) by mouth 3 (three) times daily. 180 capsule 1   glucose blood (ACCU-CHEK GUIDE TEST) test strip Use as instructed twice daily 100 each 12   glucose blood (ACCU-CHEK GUIDE) test strip Use to check blood sugar 3 times daily. E11.69 100 each 6   insulin  glargine (LANTUS  SOLOSTAR) 100 UNIT/ML Solostar Pen Inject 34 Units into the skin daily. 30 mL 1   Insulin  Pen Needle 32G X 4 MM MISC Use with Insulin  pen 100 each 3   methocarbamol  (ROBAXIN ) 500 MG tablet Take 1 tablet (500 mg total) by mouth every 6 (six) hours as needed for muscle spasms. 90 tablet 0   oxyCODONE  (OXYCONTIN ) 10 mg 12 hr tablet Take 1 tablet (10 mg total) by mouth every 12 (twelve) hours. 60 tablet 0   Oxycodone  HCl 10 MG TABS Take 1 tablet (10 mg total) by mouth every 6 (six) hours as needed. 60 tablet 0   sildenafil  (VIAGRA ) 100 MG tablet Take 0.5-1 tablets (50-100 mg total) by mouth daily as needed for erectile dysfunction. 5 tablet 11   No current facility-administered medications for this visit.    SURGICAL HISTORY:  Past Surgical History:  Procedure Laterality Date   APPLICATION OF ROBOTIC ASSISTANCE FOR SPINAL PROCEDURE N/A 02/27/2024   Procedure: APPLICATION OF ROBOTIC ASSISTANCE FOR SPINAL  PROCEDURE;  Surgeon: Lanis Pupa, MD;  Location: [facility] OR;  Service: Neurosurgery;  Laterality: N/A;   IR BONE TUMOR(S)RF ABLATION  08/01/2022   IR IMAGING GUIDED PORT INSERTION  09/21/2022   IR KYPHO LUMBAR INC FX REDUCE BONE BX UNI/BIL CANNULATION INC/IMAGING  08/01/2022   LAMINECTOMY WITH POSTERIOR LATERAL ARTHRODESIS LEVEL 4 N/A 02/27/2024   Procedure: Lumbar two LAMINECTOMY, Thoracic twelve-Lumbar four INSTRUMENTED STABILIZATION;  Surgeon: Lanis Pupa, MD;  Location: [facility] OR;  Service: Neurosurgery;  Laterality: N/A;    REVIEW OF SYSTEMS:  A comprehensive review of systems was negative.   PHYSICAL EXAMINATION: General appearance: alert, cooperative, fatigued, and no distress Head: Normocephalic, without obvious abnormality, atraumatic Neck: no adenopathy, no JVD, supple, symmetrical, trachea midline, and thyroid  not enlarged, symmetric, no tenderness/mass/nodules Lymph nodes: Cervical, supraclavicular, and axillary nodes normal. Resp: clear to auscultation bilaterally Back: symmetric, no curvature. ROM normal. No CVA tenderness. Cardio: regular rate and rhythm, S1, S2 normal, no murmur, click, rub or gallop GI: soft, non-tender; bowel sounds normal; no masses,  no organomegaly Extremities: extremities normal, atraumatic,  no cyanosis or edema  ECOG PERFORMANCE STATUS: 1 - Symptomatic but completely ambulatory  Blood pressure 113/83, pulse 74, temperature 97.6 F (36.4 C), temperature source Temporal, resp. rate 17, height 5' 8 (1.727 m), weight 155 lb (70.3 kg), SpO2 100%.  LABORATORY DATA: Lab Results  Component Value Date   WBC 7.5 06/24/2024   HGB 12.8 (L) 06/24/2024   HCT 38.3 (L) 06/24/2024   MCV 94.8 06/24/2024   PLT 274 06/24/2024      Chemistry      Component Value Date/Time   NA 137 06/24/2024 0929   NA 140 12/24/2020 1144   NA 142 08/02/2014 1649   K 3.7 06/24/2024 0929   K 4.2 08/02/2014 1649   CL 104 06/24/2024 0929   CL 109 (H) 08/02/2014 1649   CO2  29 06/24/2024 0929   CO2 21 08/02/2014 1649   BUN 11 06/24/2024 0929   BUN 7 12/24/2020 1144   BUN 11 08/02/2014 1649   CREATININE 0.66 06/24/2024 0929   CREATININE 0.92 08/02/2014 1649      Component Value Date/Time   CALCIUM  8.6 (L) 06/24/2024 0929   CALCIUM  8.6 08/02/2014 1649   ALKPHOS 67 06/24/2024 0929   ALKPHOS 82 08/02/2014 1649   AST 20 06/24/2024 0929   ALT 22 06/24/2024 0929   ALT 132 (H) 08/02/2014 1649   BILITOT 0.3 06/24/2024 0929       RADIOGRAPHIC STUDIES: MR Brain W Wo Contrast Result Date: 07/05/2024 CLINICAL DATA:  58 year old male with non-small cell lung cancer with a proximally 3 small treated brain metastases 2020 01/2023. Restaging. EXAM: MRI HEAD WITHOUT AND WITH CONTRAST TECHNIQUE: Multiplanar, multiecho pulse sequences of the brain and surrounding structures were obtained without and with intravenous contrast. CONTRAST:  7mL GADAVIST  GADOBUTROL  1 MMOL/ML IV SOLN COMPARISON:  Brain MRI 03/25/2024 and earlier. FINDINGS: Brain: Small focus of susceptibility with punctate enhancement at treated inferior right cerebellar site (series 1200, image 125). No edema. Medial left posterior temporal lobe/junction with occipital lobe lesion which was identified on 08/05/2022 is enhancing again on series 1200, image 166 and series 11, image 25 now. This was faintly visible on May postcontrast images, not identified in January. Faint associated T2 and FLAIR hyperintensity there (series 5, image 23) without mass effect. Furthermore, treated right occipital enhancing metastasis also visible again, series 1200, image 175, with similar faint T2/FLAIR hyperintensity. And both of these posterior hemisphere appear somewhat more nodular and indistinct than at the time of presentation. And additionally, there is subtle abnormal enhancement of the left superior cerebellum across the tentorium from the posterior left hemisphere lesion, best seen on series 10, image 36 but also apparent on  series 1200, image 158. This has a similar nodular and indistinct appearance. No associated left superior cerebellar edema or mass effect. No other abnormal intracranial enhancement or dural thickening identified. No superimposed restricted diffusion to suggest acute infarction. No midline shift, mass effect, ventriculomegaly, extra-axial collection or acute intracranial hemorrhage. Cervicomedullary junction and pituitary are within normal limits. Punctate chronic cerebral blood products at the right occipital lobe white matter on series 6, image 64 unchanged. Elsewhere normal gray and white matter signal. Vascular: Major intracranial vascular flow voids are stable. Skull and upper cervical spine: Visualized bone marrow signal is within normal limits. Negative visible cervical spine. Sinuses/Orbits: Stable.  Mild paranasal sinus mucosal thickening. Other: Trace mastoid effusions. Benign left occipital scalp lipoma redemonstrated. IMPRESSION: 1. Re-enhancement of small treated posterior cerebral hemisphere metastases, both appearing more nodular and  indistinct than at the time of treatment, and both with mild associated T2/FLAIR hyperintensity. 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 Small new/progressed brain metastases difficult to exclude. Recommend repeat MRI without and with contrast in 2-3 months to re-evaluate. 2. Treated right cerebellar metastasis remains stable with no new signal abnormality. Electronically Signed   By: VEAR Hurst M.D.   On: 07/05/2024 11:39   CT CHEST ABDOMEN PELVIS W CONTRAST Result Date: 06/24/2024 CLINICAL DATA:  Non-small-cell lung cancer. Restaging. * Tracking Code: BO * EXAM: CT CHEST, ABDOMEN, AND PELVIS WITH CONTRAST TECHNIQUE: Multidetector CT imaging of the chest,  abdomen and pelvis was performed following the standard protocol during bolus administration of intravenous contrast. RADIATION DOSE REDUCTION: This exam was performed according to the departmental dose-optimization program which includes automated exposure control, adjustment of the mA and/or kV according to patient size and/or use of iterative reconstruction technique. CONTRAST:  OMNIPAQUE  IOHEXOL  300 MG/ML  SOLN COMPARISON:  03/26/2024 FINDINGS: CT CHEST FINDINGS Cardiovascular: Port-A-Cath tip at high right atrium. Bovine arch. Aortic atherosclerosis. Normal heart size, without pericardial effusion. No central pulmonary embolism, on this non-dedicated study. Mediastinum/Nodes: No supraclavicular adenopathy. No mediastinal or hilar adenopathy. Esophageal fluid level on 34/2. Lungs/Pleura: No pleural fluid. Paraseptal and centrilobular emphysema. No suspicious pulmonary nodule or mass. Musculoskeletal: Included within the abdomen pelvic section. CT ABDOMEN PELVIS FINDINGS Hepatobiliary: Beam hardening artifact from thoracolumbar spine fixation. Normal liver. Normal gallbladder, without biliary ductal dilatation. Pancreas: Normal, without mass or ductal dilatation. Spleen: Normal in size, without focal abnormality. Adrenals/Urinary Tract: Normal left adrenal gland. Right adrenal thickening mild nodularity are unchanged. Normal left kidney. Punctate right renal collecting system calculi without hydronephrosis. Normal urinary bladder. Stomach/Bowel: Normal stomach, without wall thickening. Colonic stool burden suggests constipation. Normal terminal ileum and appendix. Normal small bowel. Vascular/Lymphatic: Advanced aortic and branch vessel atherosclerosis. No abdominopelvic adenopathy. Reproductive: Normal prostate. Other: No significant free fluid. No free intraperitoneal air. No evidence of omental or peritoneal disease. Musculoskeletal: T12 through L4 trans pedicle screw fixation. Underlying severe L2  compression deformity, status post vertebral augmentation with ventral canal encroachment. Similar underlying sclerosis which could be posttraumatic or metastatic. Lytic lesion within the right-side of the L3 vertebral body is relatively similar at 2.1 cm on sagittal image 125. Old bilateral rib fractures. Eccentric right T3 somewhat ill-defined sclerosis is similar including on sagittal image 127. IMPRESSION: 1. Similar osseous metastasis including within T3 and L3. 2. No evidence of extraosseous metastatic disease. Mildly limited evaluation of the abdomen secondary to beam hardening artifact from thoracolumbar spine fixation. 3. Similar right adrenal thickening and mild nodularity are nonspecific and warrant follow-up attention. 4. Incidental findings, including: Aortic atherosclerosis (ICD10-I70.0) and emphysema (ICD10-J43.9). Right nephrolithiasis. Possible constipation. Esophageal air fluid level suggests dysmotility or gastroesophageal reflux. Electronically Signed   By: Rockey Kilts M.D.   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 He  has molecular studies by foundation 1 that showed no actionable mutations and PD-L1 expression was 90%. He underwent palliative radiotherapy to the L2 vertebral body compression fraction as well as solitary brain metastasis with SRS. He is currently undergoing palliative systemic chemotherapy with carboplatin  for AUC of 5, Alimta  500 Mg/M2 and Keytruda  200 Mg IV every 3 weeks.  Status post 32 cycles. Starting from cycle #5 he is  on treatment with maintenance Alimta  and Keytruda  every 3 weeks.  He has been tolerating this treatment well with no concerning adverse effects. Assessment and Plan Assessment & Plan Stage 4 lung cancer, currently under treatment Stage 4 lung cancer, currently under treatment with maintenance Alimta  and Keytruda . He is on cycle number 33 of the treatment. Reports fatigue but denies chest pain, dyspnea, nausea, vomiting, diarrhea, or headaches. Labs are pending. - Continue maintenance Alimta  and Keytruda  treatment. - Evaluate the need to discontinue Keytruda  after cycle 35. - Provide a letter stating diagnosis of stage 4 lung cancer and current treatment. For the pain management, he is currently on MS Contin  15 mg p.o. twice daily in addition to Oxy IR for breakthrough pain and managed by the palliative care team.  The patient was advised to call immediately if he has any concerning symptoms in the interval.  The patient voices understanding of current disease status and treatment options and is in agreement with the current care plan.  All questions were answered. The patient knows to call the clinic with any problems, questions or concerns. We can certainly see the patient much sooner if necessary.  The total time spent in the appointment was 20 minutes.  Disclaimer: This note was dictated with voice recognition software. Similar sounding words can inadvertently be transcribed and may not be corrected upon review.

## 2024-07-16 LAB — T4: T4, Total: 8.8 ug/dL (ref 4.5–12.0)

## 2024-07-17 ENCOUNTER — Encounter: Payer: Self-pay | Admitting: Internal Medicine

## 2024-07-23 ENCOUNTER — Other Ambulatory Visit: Payer: Self-pay | Admitting: Nurse Practitioner

## 2024-07-23 DIAGNOSIS — C3490 Malignant neoplasm of unspecified part of unspecified bronchus or lung: Secondary | ICD-10-CM

## 2024-07-23 DIAGNOSIS — Z515 Encounter for palliative care: Secondary | ICD-10-CM

## 2024-07-23 DIAGNOSIS — G893 Neoplasm related pain (acute) (chronic): Secondary | ICD-10-CM

## 2024-07-23 MED ORDER — OXYCODONE HCL ER 10 MG PO T12A
10.0000 mg | EXTENDED_RELEASE_TABLET | Freq: Two times a day (BID) | ORAL | 0 refills | Status: DC
  Filled 2024-07-23: qty 60, 30d supply, fill #0

## 2024-07-23 MED ORDER — OXYCODONE HCL 10 MG PO TABS
10.0000 mg | ORAL_TABLET | Freq: Four times a day (QID) | ORAL | 0 refills | Status: DC | PRN
Start: 2024-07-23 — End: 2024-08-08
  Filled 2024-07-23: qty 60, 15d supply, fill #0

## 2024-07-24 ENCOUNTER — Encounter: Payer: Self-pay | Admitting: Internal Medicine

## 2024-07-24 ENCOUNTER — Other Ambulatory Visit: Payer: Self-pay

## 2024-07-24 ENCOUNTER — Other Ambulatory Visit (HOSPITAL_COMMUNITY): Payer: Self-pay

## 2024-07-31 ENCOUNTER — Telehealth: Payer: Self-pay | Admitting: Internal Medicine

## 2024-07-31 NOTE — Progress Notes (Signed)
 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    Biomarker Findings Microsatellite status - MS-Stable Tumor Mutational Burden - 8 Muts/Mb Genomic Findings For a complete list of the genes assayed, please refer to the Appendix. KEAP1 Q227* CDKN2A/B [id number] G89V PRKCI amplification TERC amplification - equivocal? TP53 [id number]*18 8 Disease relevant genes with no reportable alterations: ALK, BRAF, EGFR, ERBB2, KRAS, MET, RET, ROS1   PDL1 TPS  90%   PRIOR THERAPY: 1) Status post palliative radiotherapy to the L2 lesion under the care of Samuel [name]. 2) Status post SRS to solitary brain metastasis under the care of Samuel [name] 3) status post L2 laminectomy for decompression of the thecal sac under the care of Samuel [name] on 02/27/2024.    CURRENT THERAPY: Palliative systemic therapy with carboplatin  for AUC of 5, Alimta  500 Mg/M2 and Keytruda  200 Mg IV every 3 weeks. First dose September 05, 2022. Status post 33 cycles. Starting from cycle #5, he started on maintenance keytruda  and alimta  IV every 3 weeks.   INTERVAL HISTORY: Samuel [name] 58 y.o. male returns to the clinic today for a follow up visit. The patient was last seen in the clinic 3 weeks ago by Samuel [name].  The patient is feeling well. For allergies he uses Flonase  and Zyrtec .  He denies any significant shortness of breath and his cough is improved.   He experiences a burning sensation in the middle of his chest over the past two weeks, which he associates with taking Viagra   for erectile dysfunction. He has a history of acid reflux, previously managed with Prilosec (omeprazole ). Currently, he is taking Pepcid  (famotidine ) but notes it is not effective. He notices his symptoms worse at night after laying down and with certain foods like spaghetti sauce. He admits he does not always sit upright after eating.     No nausea, vomiting, diarrhea, constipation, rashes, headaches, or vision changes. No fevers, chills, or night sweats. He has a history of hypoglycemia, with recent episodes of blood sugar dropping to 52 mg/dL. He adjusted his insulin  dosage from 32 units to 26 units once a day, which has improved his morning blood sugar levels to the 70s. He experiences symptoms of hypoglycemia such as sweating, shaking, and dizziness, and manages these with orange juice and glucose tablets. He continues to exercise regularly, which affects his blood sugar levels. He is exercising on a regular basis.   Samuel [name] discussed stopping keytruda  at his next appointment. He had some questions about this to make sure he understood correctly.   The purpose of his visit is for toxicity check prior to undergoing treatment today as planned with cycle #34.    MEDICAL HISTORY: Past Medical History:  Diagnosis Date   Alcohol  use disorder, severe, dependence (HCC) 12/26/2016   Anxiety    Depression    Diabetes mellitus without complication (HCC)    Metastatic cancer to brain (HCC) 08/05/2022   Metastatic cancer to spine (HCC) 07/19/2022   Pancreatitis unk   Primary lung adenocarcinoma (HCC) 08/05/2022   Suicidal ideation    Type 2 diabetes mellitus  with hyperglycemia, without long-term current use of insulin  (HCC) 06/13/2022   Formatting of this note might be different from the original. 06/13/2022 A1C 13.8, FSBG 414 Start empagliflozin  5mg /metformin  1000 daily, levemir 20 qhs. (Samples given) Will apply for medassist for pharmacy    ALLERGIES:  is allergic to penicillins and percocet  [oxycodone -acetaminophen ].  MEDICATIONS:  Current Outpatient Medications  Medication Sig Dispense Refill   omeprazole  (PRILOSEC) 20 MG capsule Take 1 capsule (20 mg total) by mouth daily. 30 capsule 2   acetaminophen  (TYLENOL ) 500 MG tablet Take 1,000 mg by mouth every 6 (six) hours as needed for moderate pain (pain score 4-6).     atorvastatin  (LIPITOR) 40 MG tablet Take 1 tablet (40 mg total) by mouth daily. 90 tablet 1   buPROPion  (WELLBUTRIN  XL) 150 MG 24 hr tablet Take 1 tablet (150 mg total) by mouth daily. 90 tablet 0   cetirizine  (ZYRTEC ) 10 MG tablet Take 1 tablet (10 mg total) by mouth daily. 30 tablet 1   Continuous Glucose Receiver (FREESTYLE LIBRE 3 READER) DEVI Use to check blood sugar continuously throughout the day. 1 each 0   Continuous Glucose Sensor (FREESTYLE LIBRE 3 PLUS SENSOR) MISC Change sensor every 15 days. Use to check blood sugar continuously. 2 each 6   empagliflozin  (JARDIANCE ) 25 MG TABS tablet Take 1 tablet (25 mg total) by mouth daily before breakfast. 90 tablet 1   escitalopram  (LEXAPRO ) 10 MG tablet Take 1 tablet (10 mg total) by mouth daily. 90 tablet 0   famotidine  (PEPCID ) 40 MG tablet Take 1 tablet (40 mg total) by mouth daily. 30 tablet 3   fluticasone  (FLONASE ) 50 MCG/ACT nasal spray Place 2 sprays into both nostrils daily. 16 g 1   folic acid  (FOLVITE ) 1 MG tablet Take 1 tablet (1 mg total) by mouth daily. 30 tablet 4   gabapentin  (NEURONTIN ) 300 MG capsule Take 2 capsules (600 mg total) by mouth 3 (three) times daily. 180 capsule 1   glucose blood (ACCU-CHEK GUIDE TEST) test strip Use as instructed twice daily 100 each 12   glucose blood (ACCU-CHEK GUIDE) test strip Use to check blood sugar 3 times daily. E11.69 100 each 6   insulin  glargine (LANTUS  SOLOSTAR) 100 UNIT/ML Solostar Pen Inject 34 Units into the skin daily. 30 mL 1   Insulin  Pen Needle 32G X 4 MM MISC Use with Insulin  pen 100 each 3   methocarbamol  (ROBAXIN ) 500 MG tablet Take 1 tablet  (500 mg total) by mouth every 6 (six) hours as needed for muscle spasms. 90 tablet 0   oxyCODONE  (OXYCONTIN ) 10 mg 12 hr tablet Take 1 tablet (10 mg total) by mouth every 12 (twelve) hours. 60 tablet 0   Oxycodone  HCl 10 MG TABS Take 1 tablet (10 mg total) by mouth every 6 (six) hours as needed. 60 tablet 0   sildenafil  (VIAGRA ) 100 MG tablet Take 0.5-1 tablets (50-100 mg total) by mouth daily as needed for erectile dysfunction. 5 tablet 11   No current facility-administered medications for this visit.    SURGICAL HISTORY:  Past Surgical History:  Procedure Laterality Date   APPLICATION OF ROBOTIC ASSISTANCE FOR SPINAL PROCEDURE N/A 02/27/2024   Procedure: APPLICATION OF ROBOTIC ASSISTANCE FOR SPINAL PROCEDURE;  Surgeon: [name] Pupa, MD;  Location: [facility] OR;  Service: Neurosurgery;  Laterality: N/A;   IR BONE TUMOR(S)RF ABLATION  08/01/2022   IR IMAGING GUIDED PORT INSERTION  09/21/2022   IR KYPHO LUMBAR INC FX REDUCE BONE BX UNI/BIL CANNULATION  INC/IMAGING  08/01/2022   LAMINECTOMY WITH POSTERIOR LATERAL ARTHRODESIS LEVEL 4 N/A 02/27/2024   Procedure: Lumbar two LAMINECTOMY, Thoracic twelve-Lumbar four INSTRUMENTED STABILIZATION;  Surgeon: [name] Pupa, MD;  Location: [facility] OR;  Service: Neurosurgery;  Laterality: N/A;    REVIEW OF SYSTEMS:   Review of Systems  Constitutional: Positive for stable fatigue. Negative for appetite change, chills, fever and unexpected weight change.  HENT: Negative for mouth sores, nosebleeds, sore throat and trouble swallowing.   Eyes: Negative for eye problems and icterus.  Respiratory: Negative for cough, hemoptysis, shortness of breath and wheezing.   Cardiovascular: Negative for chest pain and leg swelling.  Gastrointestinal: Positive for heartburn. Negative for abdominal pain, constipation, diarrhea, nausea and vomiting.  Genitourinary: Negative for bladder incontinence, difficulty urinating, dysuria, frequency and hematuria.   Musculoskeletal:   Positive for chronic back pain (greatly improved since diagnosed). Negative for gait problem, neck pain and neck stiffness.  Skin: Negative for itching and rash.  Neurological: Negative for dizziness, extremity weakness, gait problem, headaches, light-headedness and seizures.  Hematological: Negative for adenopathy. Does not bruise/bleed easily.  Psychiatric/Behavioral: Negative for confusion, depression and sleep disturbance. The patient is not nervous/anxious.    PHYSICAL EXAMINATION:  Blood pressure 128/74, pulse 84, temperature 98.7 F (37.1 C), temperature source Temporal, resp. rate 18, height 5' 8 (1.727 m), weight 156 lb 1.6 oz (70.8 kg), SpO2 98%.  ECOG PERFORMANCE STATUS: 1  Physical Exam  Constitutional: Oriented to person, place, and time and well-developed, well-nourished, and in no distress.  HENT:  Head: Normocephalic and atraumatic.  Mouth/Throat: Oropharynx is clear and moist. No oropharyngeal exudate.  Eyes: Conjunctivae are normal. Right eye exhibits no discharge. Left eye exhibits no discharge. No scleral icterus.  Neck: Normal range of motion. Neck supple.  Cardiovascular: Normal rate, regular rhythm, normal heart sounds and intact distal pulses.   Pulmonary/Chest: Effort normal and breath sounds normal. No respiratory distress. No wheezes. No rales.  Abdominal: Soft. Bowel sounds are normal. Exhibits no distension and no mass. There is no tenderness.  Musculoskeletal: Normal range of motion. Exhibits no edema. He is wearing a back brace.  Lymphadenopathy:    No cervical adenopathy.  Neurological: Alert and oriented to person, place, and time. Exhibits normal muscle tone. Gait normal. Coordination normal.  Skin: Skin is warm and dry. No rash noted. Not diaphoretic. No erythema. No pallor.  Psychiatric: Mood, memory and judgment normal.  Vitals reviewed.  LABORATORY DATA: Lab Results  Component Value Date   WBC 6.9 08/05/2024   HGB 12.6 (L) 08/05/2024   HCT  36.8 (L) 08/05/2024   MCV 94.6 08/05/2024   PLT 231 08/05/2024      Chemistry      Component Value Date/Time   NA 137 08/05/2024 1407   NA 140 12/24/2020 1144   NA 142 08/02/2014 1649   K 3.8 08/05/2024 1407   K 4.2 08/02/2014 1649   CL 104 08/05/2024 1407   CL 109 (H) 08/02/2014 1649   CO2 26 08/05/2024 1407   CO2 21 08/02/2014 1649   BUN 16 08/05/2024 1407   BUN 7 12/24/2020 1144   BUN 11 08/02/2014 1649   CREATININE 0.80 08/05/2024 1407   CREATININE 0.92 08/02/2014 1649      Component Value Date/Time   CALCIUM  8.8 (L) 08/05/2024 1407   CALCIUM  8.6 08/02/2014 1649   ALKPHOS 73 08/05/2024 1407   ALKPHOS 82 08/02/2014 1649   AST 20 08/05/2024 1407   ALT 23 08/05/2024 1407   ALT  132 (H) 08/02/2014 1649   BILITOT 0.4 08/05/2024 1407       RADIOGRAPHIC STUDIES:  No results found.    ASSESSMENT/PLAN:  This is a very pleasant 58 year old African-American male diagnosed with stage IV (T3, N2, M1 C) non-small cell lung cancer, adenocarcinoma.  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  He underwent biopsy and kyphoplasty to the L2 and to the brain metastasis.  He was diagnosed in September 2023.   His molecular studies showed no actionable mutations and his PD-L1 expression is 90%.  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    He underwent palliative radiation to L2 compression fracture as well as a solitary brain metastasis with SRS.    He is currently being treated with palliative systemic chemotherapy with carboplatin  for an AUC of 5, Alimta  500 mg per metered square, Keytruda  200 mg IV every 3 weeks.  He status post 33 cycles and he tolerated well without any concerning adverse side effects.   Starting from cycle #5 he started maintenance Alimta  and Keytruda .  IV every 3 weeks   Radiation oncology is monitoring his brain MRI and lumbar spine closely every few months.   We discussed in more detail about stopping keytruda  after 2 years. He would continue with just Alimta  IV every 3 weeks starting from his next treatment. We discussed why stage IV lung cancer is treatable but not curable. He is doing much better physically compared to when he was diagnosed.    Labs were reviewed. Recommend he proceed with cycle #34 today as scheduled.    We will see him back for follow-up visit in 3 weeks for evaluation repeat blood work before undergoing cycle #35.   Gastroesophageal reflux disease (GERD)  Famotidine  ineffective. - Discontinue famotidine . - Start omeprazole  daily. - Advise to eat earlier and remain upright for 2-3 hours post-meal. - Avoid reclining immediately after eating.   The patient was advised to call immediately if he has any concerning symptoms in the interval. The patient voices understanding of current disease status and treatment options and is in agreement with the current care plan. All questions were answered. The patient knows to call the clinic with any problems, questions or concerns. We can certainly see the patient much sooner if necessary      No orders of the defined types were placed in this encounter.    The total time spent in the appointment was 20-29 minutes  Samar Venneman L Laysa Kimmey, PA-C 08/05/24

## 2024-08-05 ENCOUNTER — Inpatient Hospital Stay: Payer: MEDICAID

## 2024-08-05 ENCOUNTER — Inpatient Hospital Stay: Payer: MEDICAID | Attending: Adult Health | Admitting: Physician Assistant

## 2024-08-05 ENCOUNTER — Other Ambulatory Visit (HOSPITAL_COMMUNITY): Payer: Self-pay

## 2024-08-05 ENCOUNTER — Encounter: Payer: Self-pay | Admitting: Internal Medicine

## 2024-08-05 VITALS — BP 128/74 | HR 84 | Temp 98.7°F | Resp 18 | Ht 68.0 in | Wt 156.1 lb

## 2024-08-05 DIAGNOSIS — J90 Pleural effusion, not elsewhere classified: Secondary | ICD-10-CM | POA: Insufficient documentation

## 2024-08-05 DIAGNOSIS — C3490 Malignant neoplasm of unspecified part of unspecified bronchus or lung: Secondary | ICD-10-CM

## 2024-08-05 DIAGNOSIS — E119 Type 2 diabetes mellitus without complications: Secondary | ICD-10-CM | POA: Diagnosis not present

## 2024-08-05 DIAGNOSIS — F1020 Alcohol dependence, uncomplicated: Secondary | ICD-10-CM | POA: Diagnosis not present

## 2024-08-05 DIAGNOSIS — Z79899 Other long term (current) drug therapy: Secondary | ICD-10-CM | POA: Diagnosis not present

## 2024-08-05 DIAGNOSIS — Z7984 Long term (current) use of oral hypoglycemic drugs: Secondary | ICD-10-CM | POA: Diagnosis not present

## 2024-08-05 DIAGNOSIS — C7951 Secondary malignant neoplasm of bone: Secondary | ICD-10-CM | POA: Insufficient documentation

## 2024-08-05 DIAGNOSIS — Z5112 Encounter for antineoplastic immunotherapy: Secondary | ICD-10-CM | POA: Diagnosis present

## 2024-08-05 DIAGNOSIS — C3481 Malignant neoplasm of overlapping sites of right bronchus and lung: Secondary | ICD-10-CM | POA: Diagnosis not present

## 2024-08-05 DIAGNOSIS — K219 Gastro-esophageal reflux disease without esophagitis: Secondary | ICD-10-CM | POA: Diagnosis not present

## 2024-08-05 DIAGNOSIS — C7931 Secondary malignant neoplasm of brain: Secondary | ICD-10-CM | POA: Insufficient documentation

## 2024-08-05 DIAGNOSIS — Z5111 Encounter for antineoplastic chemotherapy: Secondary | ICD-10-CM

## 2024-08-05 LAB — CMP (CANCER CENTER ONLY)
ALT: 23 U/L (ref 0–44)
AST: 20 U/L (ref 15–41)
Albumin: 3.7 g/dL (ref 3.5–5.0)
Alkaline Phosphatase: 73 U/L (ref 38–126)
Anion gap: 7 (ref 5–15)
BUN: 16 mg/dL (ref 6–20)
CO2: 26 mmol/L (ref 22–32)
Calcium: 8.8 mg/dL — ABNORMAL LOW (ref 8.9–10.3)
Chloride: 104 mmol/L (ref 98–111)
Creatinine: 0.8 mg/dL (ref 0.61–1.24)
GFR, Estimated: >60 mL/min (ref >60)
Glucose, Bld: 241 mg/dL — ABNORMAL HIGH (ref 70–99)
Potassium: 3.8 mmol/L (ref 3.5–5.1)
Sodium: 137 mmol/L (ref 135–145)
Total Bilirubin: 0.4 mg/dL (ref 0.0–1.2)
Total Protein: 7.2 g/dL (ref 6.5–8.1)

## 2024-08-05 LAB — CBC WITH DIFFERENTIAL (CANCER CENTER ONLY)
Abs Immature Granulocytes: 0.02 K/uL (ref 0.00–0.07)
Basophils Absolute: 0 K/uL (ref 0.0–0.1)
Basophils Relative: 0 %
Eosinophils Absolute: 0.1 K/uL (ref 0.0–0.5)
Eosinophils Relative: 1 %
HCT: 36.8 % — ABNORMAL LOW (ref 39.0–52.0)
Hemoglobin: 12.6 g/dL — ABNORMAL LOW (ref 13.0–17.0)
Immature Granulocytes: 0 %
Lymphocytes Relative: 18 %
Lymphs Abs: 1.3 K/uL (ref 0.7–4.0)
MCH: 32.4 pg (ref 26.0–34.0)
MCHC: 34.2 g/dL (ref 30.0–36.0)
MCV: 94.6 fL (ref 80.0–100.0)
Monocytes Absolute: 0.6 K/uL (ref 0.1–1.0)
Monocytes Relative: 8 %
Neutro Abs: 5 K/uL (ref 1.7–7.7)
Neutrophils Relative %: 73 %
Platelet Count: 231 K/uL (ref 150–400)
RBC: 3.89 MIL/uL — ABNORMAL LOW (ref 4.22–5.81)
RDW: 13.5 % (ref 11.5–15.5)
WBC Count: 6.9 K/uL (ref 4.0–10.5)
nRBC: 0 % (ref 0.0–0.2)

## 2024-08-05 MED ORDER — SODIUM CHLORIDE 0.9 % IV SOLN
500.0000 mg/m2 | Freq: Once | INTRAVENOUS | Status: AC
  Administered 2024-08-05: 900 mg via INTRAVENOUS
  Filled 2024-08-05: qty 20

## 2024-08-05 MED ORDER — SODIUM CHLORIDE 0.9 % IV SOLN: Freq: Once | INTRAVENOUS | Status: AC

## 2024-08-05 MED ORDER — OMEPRAZOLE 20 MG PO CPDR
20.0000 mg | DELAYED_RELEASE_CAPSULE | Freq: Every day | ORAL | 2 refills | Status: DC
  Filled 2024-08-05: qty 30, 30d supply, fill #0

## 2024-08-05 MED ORDER — PROCHLORPERAZINE MALEATE 10 MG PO TABS
10.0000 mg | ORAL_TABLET | Freq: Once | ORAL | Status: AC
  Administered 2024-08-05: 10 mg via ORAL
  Filled 2024-08-05: qty 1

## 2024-08-05 MED ORDER — SODIUM CHLORIDE 0.9 % IV SOLN
200.0000 mg | Freq: Once | INTRAVENOUS | Status: AC
  Administered 2024-08-05: 200 mg via INTRAVENOUS
  Filled 2024-08-05: qty 200

## 2024-08-06 ENCOUNTER — Other Ambulatory Visit: Payer: Self-pay

## 2024-08-06 ENCOUNTER — Encounter: Payer: Self-pay | Admitting: Internal Medicine

## 2024-08-06 ENCOUNTER — Encounter: Payer: Self-pay | Admitting: Family Medicine

## 2024-08-06 ENCOUNTER — Telehealth (HOSPITAL_COMMUNITY): Payer: Self-pay

## 2024-08-06 ENCOUNTER — Other Ambulatory Visit (HOSPITAL_COMMUNITY): Payer: Self-pay

## 2024-08-06 ENCOUNTER — Ambulatory Visit: Payer: MEDICAID | Attending: Family Medicine | Admitting: Family Medicine

## 2024-08-06 ENCOUNTER — Telehealth: Payer: Self-pay

## 2024-08-06 ENCOUNTER — Other Ambulatory Visit (HOSPITAL_BASED_OUTPATIENT_CLINIC_OR_DEPARTMENT_OTHER): Payer: Self-pay

## 2024-08-06 VITALS — BP 128/79 | HR 79 | Ht 68.0 in | Wt 157.4 lb

## 2024-08-06 DIAGNOSIS — Z1211 Encounter for screening for malignant neoplasm of colon: Secondary | ICD-10-CM

## 2024-08-06 DIAGNOSIS — C3490 Malignant neoplasm of unspecified part of unspecified bronchus or lung: Secondary | ICD-10-CM

## 2024-08-06 DIAGNOSIS — M792 Neuralgia and neuritis, unspecified: Secondary | ICD-10-CM

## 2024-08-06 DIAGNOSIS — Z794 Long term (current) use of insulin: Secondary | ICD-10-CM | POA: Diagnosis not present

## 2024-08-06 DIAGNOSIS — K219 Gastro-esophageal reflux disease without esophagitis: Secondary | ICD-10-CM

## 2024-08-06 DIAGNOSIS — Z7984 Long term (current) use of oral hypoglycemic drugs: Secondary | ICD-10-CM | POA: Diagnosis not present

## 2024-08-06 DIAGNOSIS — E1142 Type 2 diabetes mellitus with diabetic polyneuropathy: Secondary | ICD-10-CM

## 2024-08-06 DIAGNOSIS — F17200 Nicotine dependence, unspecified, uncomplicated: Secondary | ICD-10-CM

## 2024-08-06 DIAGNOSIS — C7951 Secondary malignant neoplasm of bone: Secondary | ICD-10-CM

## 2024-08-06 DIAGNOSIS — G893 Neoplasm related pain (acute) (chronic): Secondary | ICD-10-CM

## 2024-08-06 DIAGNOSIS — I700 Atherosclerosis of aorta: Secondary | ICD-10-CM | POA: Diagnosis not present

## 2024-08-06 MED ORDER — GABAPENTIN 300 MG PO CAPS
600.0000 mg | ORAL_CAPSULE | Freq: Three times a day (TID) | ORAL | 1 refills | Status: AC
Start: 2024-08-06 — End: ?
  Filled 2024-08-06: qty 180, 30d supply, fill #0
  Filled 2024-10-18: qty 180, 30d supply, fill #1

## 2024-08-06 MED ORDER — LANTUS SOLOSTAR 100 UNIT/ML ~~LOC~~ SOPN
26.0000 [IU] | PEN_INJECTOR | Freq: Every day | SUBCUTANEOUS | 6 refills | Status: DC
  Filled 2024-08-06: qty 30, 115d supply, fill #0

## 2024-08-06 MED ORDER — EMPAGLIFLOZIN 25 MG PO TABS
25.0000 mg | ORAL_TABLET | Freq: Every day | ORAL | 1 refills | Status: AC
  Filled 2024-08-06: qty 90, 90d supply, fill #0

## 2024-08-06 MED ORDER — ATORVASTATIN CALCIUM 40 MG PO TABS
40.0000 mg | ORAL_TABLET | Freq: Every day | ORAL | 1 refills | Status: AC
Start: 2024-08-06 — End: ?
  Filled 2024-08-06: qty 90, 90d supply, fill #0

## 2024-08-06 NOTE — Telephone Encounter (Signed)
 Pharmacy Patient Advocate Encounter  Received notification from [employer] [employer] that Prior Authorization for FREESTYLE LIBRE 3 PLUS SENSOR has been APPROVED from 08/06/2024 to 08/06/2025

## 2024-08-06 NOTE — Progress Notes (Signed)
 Subjective:  Patient ID: Samuel [name], male    DOB: 07-18-1966  Age: 58 y.o. MRN: [id number]  CC: Medical Management of Chronic Issues     Discussed the use of AI scribe software for clinical note transcription with the patient, who gave verbal consent to proceed.  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  He is undergoing treatment for metastatic lung cancer with recent chemotherapy infusion and is scheduled for the next treatment on October 6th.  MRI brain revealed stable metastasis with no new signal abnormality.  CT chest and abdomen from last month revealed no evidence of extraosseous metastatic disease.  He is closely followed by oncology.  He underwent kyphoplasty for back pain, which is now minimal.  He experiences episodes of hypoglycemia with blood sugar levels in the 50s, occurring two to three times a week. He adjusted his insulin  dose from 32 to 26 units and is monitoring his diet and meal timing to manage these episodes.  He experiences chest burning when taking Viagra , which he switched back to from Cialis , without shortness of breath. He started omeprazole  for acid reflux at his visit with oncology yesterday.  Current medications include atorvastatin , Jardiance , gabapentin , omeprazole , and Wellbutrin . Wellbutrin  is helping reduce smoking cravings, leading to decreased smoking.    Past Medical History:  Diagnosis Date   Alcohol  use disorder, severe, dependence (HCC) 12/26/2016   Anxiety    Depression    Diabetes mellitus without complication (HCC)    Metastatic cancer to brain  (HCC) 08/05/2022   Metastatic cancer to spine (HCC) 07/19/2022   Pancreatitis unk   Primary lung adenocarcinoma (HCC) 08/05/2022   Suicidal ideation    Type 2 diabetes mellitus with hyperglycemia, without long-term current use of insulin  (HCC) 06/13/2022   Formatting of this note might be different from the original. 06/13/2022 A1C 13.8, FSBG 414 Start empagliflozin  5mg /metformin  1000 daily, levemir 20 qhs. (Samples given) Will apply for medassist for pharmacy    Past Surgical History:  Procedure Laterality Date   APPLICATION OF ROBOTIC ASSISTANCE FOR SPINAL PROCEDURE N/A 02/27/2024   Procedure: APPLICATION OF ROBOTIC ASSISTANCE FOR SPINAL PROCEDURE;  Surgeon: Lanis Pupa, MD;  Location: [facility] OR;  Service: Neurosurgery;  Laterality: N/A;   IR BONE TUMOR(S)RF ABLATION  08/01/2022   IR IMAGING GUIDED PORT INSERTION  09/21/2022   IR KYPHO LUMBAR INC FX REDUCE BONE BX UNI/BIL CANNULATION INC/IMAGING  08/01/2022   LAMINECTOMY WITH POSTERIOR LATERAL ARTHRODESIS LEVEL 4 N/A 02/27/2024   Procedure: Lumbar two LAMINECTOMY, Thoracic twelve-Lumbar four INSTRUMENTED STABILIZATION;  Surgeon: Lanis Pupa, MD;  Location: [facility] OR;  Service: Neurosurgery;  Laterality: N/A;    No family history on file.  Social History   Socioeconomic History   Marital status: Single    Spouse name: Not on file   Number of children: 0   Years of education: Not on file   Highest education level: Associate degree: academic program  Occupational History   Not on file  Tobacco Use   Smoking status: Every Day    Current packs/day: 0.50    Average packs/day: 0.5 packs/day for 1.7 years (0.9 ttl pk-yrs)  Types: Cigarettes    Start date: 11/16/2022    Last attempt to quit: 08/17/2022   Smokeless tobacco: Never  Vaping Use   Vaping status: Never Used  Substance and Sexual Activity   Alcohol  use: Yes    Alcohol /week: 2.0 standard drinks of alcohol     Types: 2 Cans of beer per week   Drug use: No   Sexual  activity: Yes    Partners: Female    Birth control/protection: None  Other Topics Concern   Not on file  Social History Narrative   Not on file   Social Drivers of Health   Financial Resource Strain: Low Risk  (06/27/2024)   Overall Financial Resource Strain (CARDIA)    Difficulty of Paying Living Expenses: Not very hard  Recent Concern: Financial Resource Strain - Medium Risk (04/23/2024)   Overall Financial Resource Strain (CARDIA)    Difficulty of Paying Living Expenses: Somewhat hard  Food Insecurity: No Food Insecurity (03/28/2024)   Hunger Vital Sign    Worried About Running Out of Food in the Last Year: Never true    Ran Out of Food in the Last Year: Never true  Transportation Needs: No Transportation Needs (03/28/2024)   PRAPARE - Administrator, Civil Service (Medical): No    Lack of Transportation (Non-Medical): No  Physical Activity: Sufficiently Active (06/27/2024)   Exercise Vital Sign    Days of Exercise per Week: 3 days    Minutes of Exercise per Session: 50 min  Recent Concern: Physical Activity - Insufficiently Active (04/23/2024)   Exercise Vital Sign    Days of Exercise per Week: 2 days    Minutes of Exercise per Session: 40 min  Stress: No Stress Concern Present (06/27/2024)   [employer] of Occupational Health - Occupational Stress Questionnaire    Feeling of Stress: Not at all  Recent Concern: Stress - Stress Concern Present (04/23/2024)   [employer] of Occupational Health - Occupational Stress Questionnaire    Feeling of Stress : To some extent  Social Connections: Moderately Isolated (06/27/2024)   Social Connection and Isolation Panel    Frequency of Communication with Friends and Family: Once a week    Frequency of Social Gatherings with Friends and Family: More than three times a week    Attends Religious Services: 1 to 4 times per year    Active Member of [employer] or Organizations: No    Attends Banker Meetings: Never     Marital Status: Divorced    Allergies  Allergen Reactions   Penicillins Hives   Percocet [Oxycodone -Acetaminophen ] Nausea Only and Other (See Comments)    Pt states the pill is hard to swallow. States he was told it may be the coating on the tablet that he is allergic to.     Outpatient Medications Prior to Visit  Medication Sig Dispense Refill   acetaminophen  (TYLENOL ) 500 MG tablet Take 1,000 mg by mouth every 6 (six) hours as needed for moderate pain (pain score 4-6).     buPROPion  (WELLBUTRIN  XL) 150 MG 24 hr tablet Take 1 tablet (150 mg total) by mouth daily. 90 tablet 0   cetirizine  (ZYRTEC ) 10 MG tablet Take 1 tablet (10 mg total) by mouth daily. 30 tablet 1   Continuous Glucose Receiver (FREESTYLE LIBRE 3 READER) DEVI Use to check blood sugar continuously throughout the day. 1 each 0   Continuous Glucose Sensor (FREESTYLE LIBRE 3 PLUS SENSOR) MISC Change sensor every 15 days. Use to  check blood sugar continuously. 2 each 6   escitalopram  (LEXAPRO ) 10 MG tablet Take 1 tablet (10 mg total) by mouth daily. 90 tablet 0   famotidine  (PEPCID ) 40 MG tablet Take 1 tablet (40 mg total) by mouth daily. 30 tablet 3   fluticasone  (FLONASE ) 50 MCG/ACT nasal spray Place 2 sprays into both nostrils daily. 16 g 1   folic acid  (FOLVITE ) 1 MG tablet Take 1 tablet (1 mg total) by mouth daily. 30 tablet 4   glucose blood (ACCU-CHEK GUIDE TEST) test strip Use as instructed twice daily 100 each 12   glucose blood (ACCU-CHEK GUIDE) test strip Use to check blood sugar 3 times daily. E11.69 100 each 6   Insulin  Pen Needle 32G X 4 MM MISC Use with Insulin  pen 100 each 3   methocarbamol  (ROBAXIN ) 500 MG tablet Take 1 tablet (500 mg total) by mouth every 6 (six) hours as needed for muscle spasms. 90 tablet 0   omeprazole  (PRILOSEC) 20 MG capsule Take 1 capsule (20 mg total) by mouth daily. 30 capsule 2   oxyCODONE  (OXYCONTIN ) 10 mg 12 hr tablet Take 1 tablet (10 mg total) by mouth every 12 (twelve) hours. 60  tablet 0   Oxycodone  HCl 10 MG TABS Take 1 tablet (10 mg total) by mouth every 6 (six) hours as needed. 60 tablet 0   sildenafil  (VIAGRA ) 100 MG tablet Take 0.5-1 tablets (50-100 mg total) by mouth daily as needed for erectile dysfunction. 5 tablet 11   atorvastatin  (LIPITOR) 40 MG tablet Take 1 tablet (40 mg total) by mouth daily. 90 tablet 1   empagliflozin  (JARDIANCE ) 25 MG TABS tablet Take 1 tablet (25 mg total) by mouth daily before breakfast. 90 tablet 1   gabapentin  (NEURONTIN ) 300 MG capsule Take 2 capsules (600 mg total) by mouth 3 (three) times daily. 180 capsule 1   insulin  glargine (LANTUS  SOLOSTAR) 100 UNIT/ML Solostar Pen Inject 34 Units into the skin daily. 30 mL 1   No facility-administered medications prior to visit.     ROS Review of Systems  Constitutional:  Negative for activity change and appetite change.  HENT:  Negative for sinus pressure and sore throat.   Respiratory:  Negative for chest tightness, shortness of breath and wheezing.   Cardiovascular:  Negative for chest pain and palpitations.  Gastrointestinal:  Negative for abdominal distention, abdominal pain and constipation.  Genitourinary: Negative.   Musculoskeletal: Negative.   Psychiatric/Behavioral:  Negative for behavioral problems and dysphoric mood.     Objective:  BP 128/79   Pulse 79   Ht 5' 8 (1.727 m)   Wt 157 lb 6.4 oz (71.4 kg)   SpO2 100%   BMI 23.93 kg/m      08/06/2024    8:44 AM 08/05/2024    2:24 PM 07/15/2024    8:12 AM  BP/Weight  Systolic BP 128 128 113  Diastolic BP 79 74 83  Wt. (Lbs) 157.4 156.1 155  BMI 23.93 kg/m2 23.73 kg/m2 23.57 kg/m2      Physical Exam Constitutional:      Appearance: He is well-developed.  Cardiovascular:     Rate and Rhythm: Normal rate.     Heart sounds: Normal heart sounds. No murmur heard. Pulmonary:     Effort: Pulmonary effort is normal.     Breath sounds: Normal breath sounds. No wheezing or rales.  Chest:     Chest wall: No  tenderness.  Abdominal:     General: Bowel sounds are normal.  There is no distension.     Palpations: Abdomen is soft. There is no mass.     Tenderness: There is no abdominal tenderness.  Musculoskeletal:        General: Normal range of motion.     Right lower leg: No edema.     Left lower leg: No edema.  Neurological:     Mental Status: He is alert and oriented to person, place, and time.  Psychiatric:        Mood and Affect: Mood normal.        Latest Ref Rng & Units 08/05/2024    2:07 PM 07/15/2024    7:53 AM 06/24/2024    9:29 AM  CMP  Glucose 70 - 99 mg/dL 758  95  724   BUN 6 - 20 mg/dL 16  12  11    Creatinine 0.61 - 1.24 mg/dL 9.19  9.44  9.33   Sodium 135 - 145 mmol/L 137  138  137   Potassium 3.5 - 5.1 mmol/L 3.8  3.9  3.7   Chloride 98 - 111 mmol/L 104  107  104   CO2 22 - 32 mmol/L 26  26  29    Calcium  8.9 - 10.3 mg/dL 8.8  8.6  8.6   Total Protein 6.5 - 8.1 g/dL 7.2  7.0  7.2   Total Bilirubin 0.0 - 1.2 mg/dL 0.4  0.3  0.3   Alkaline Phos 38 - 126 U/L 73  77  67   AST 15 - 41 U/L 20  30  20    ALT 0 - 44 U/L 23  30  22      Lipid Panel     Component Value Date/Time   CHOL 133 07/12/2023 1116   TRIG 64 07/12/2023 1116   HDL 41 07/12/2023 1116   CHOLHDL 3.2 07/12/2023 1116   CHOLHDL 2.4 03/11/2021 1843   VLDL 23 03/11/2021 1843   LDLCALC 79 07/12/2023 1116    CBC    Component Value Date/Time   WBC 6.9 08/05/2024 1407   WBC 5.3 08/29/2023 0354   RBC 3.89 (L) 08/05/2024 1407   HGB 12.6 (L) 08/05/2024 1407   HGB 14.7 12/24/2020 1144   HCT 36.8 (L) 08/05/2024 1407   HCT 44.3 12/24/2020 1144   PLT 231 08/05/2024 1407   PLT 201 12/24/2020 1144   MCV 94.6 08/05/2024 1407   MCV 94 12/24/2020 1144   MCV 96 08/02/2014 1649   MCH 32.4 08/05/2024 1407   MCHC 34.2 08/05/2024 1407   RDW 13.5 08/05/2024 1407   RDW 12.3 12/24/2020 1144   RDW 14.3 08/02/2014 1649   LYMPHSABS 1.3 08/05/2024 1407   LYMPHSABS 2.5 12/24/2020 1144   LYMPHSABS 1.6 09/14/2012 0541    MONOABS 0.6 08/05/2024 1407   MONOABS 1.0 09/14/2012 0541   EOSABS 0.1 08/05/2024 1407   EOSABS 0.1 12/24/2020 1144   EOSABS 0.0 09/14/2012 0541   BASOSABS 0.0 08/05/2024 1407   BASOSABS 0.0 12/24/2020 1144   BASOSABS 0.0 09/14/2012 0541    Lab Results  Component Value Date   HGBA1C 7.6 (A) 06/20/2024       Assessment & Plan Primary lung adenocarcinoma with metastases to spine and brain Metastases to brain and spine are stable, undergoing chemotherapy, post-kyphoplasty with minimal back pain. - Continue chemotherapy with next infusion on October 6. - Evaluate need for Keytruda  with oncology team per patient.  Neoplasm related pain/neuropathic pain -Stable - Gabapentin  refilled  Type 2 diabetes mellitus with hypoglycemia and  diabetic polyneuropathy Long-term use of insulin  Long-term use of oral hypoglycemic drug Hypoglycemia persists despite insulin  reduction. Diabetic polyneuropathy managed with gabapentin . - Advise consistent evening snack to prevent morning hypoglycemia. - Continue insulin  glargine at 26 units, adjust to 24 units if hypoglycemia persists. - Continue gabapentin  for diabetic polyneuropathy.  Gastroesophageal reflux disease Reports chest burning, possibly related to reflux. - Continue current reflux treatment and monitor for symptom improvement. - Chest burning with Viagra , possibly a side effect. Reflux treatment may contribute. - Advise discontinuation of Viagra  if chest pain persists. - Monitor for improvement with reflux treatment.  Aortic atherosclerosis Managed with atorvastatin . - Continue atorvastatin  for hyperlipidemia management.    Nicotine  dependence, current Reducing smoking with bupropion . - Continue bupropion  to aid in smoking cessation.     Healthcare maintenance Screening for colon cancer-colonoscopy referral placed  Meds ordered this encounter  Medications   insulin  glargine (LANTUS  SOLOSTAR) 100 UNIT/ML Solostar Pen     Sig: Inject 26 Units into the skin daily.    Dispense:  30 mL    Refill:  6    Dose change   atorvastatin  (LIPITOR) 40 MG tablet    Sig: Take 1 tablet (40 mg total) by mouth daily.    Dispense:  90 tablet    Refill:  1   empagliflozin  (JARDIANCE ) 25 MG TABS tablet    Sig: Take 1 tablet (25 mg total) by mouth daily before breakfast.    Dispense:  90 tablet    Refill:  1   gabapentin  (NEURONTIN ) 300 MG capsule    Sig: Take 2 capsules (600 mg total) by mouth 3 (three) times daily.    Dispense:  180 capsule    Refill:  1    Follow-up: Return in about 6 months (around 02/03/2025) for Chronic medical conditions.       Corrina Sabin, MD, FAAFP. [facility] [place], [place] [phone]   08/06/2024, 10:18 AM

## 2024-08-06 NOTE — Telephone Encounter (Signed)
 Pharmacy Patient Advocate Encounter  Received notification from [employer] [place] [employer]ICA[employer] that Prior Authorization for JARDIANCE  has been APPROVED from 08/06/2024 to 08/06/2025

## 2024-08-06 NOTE — Patient Instructions (Signed)
 VISIT SUMMARY:  You had a follow-up appointment to review your ongoing treatment for metastatic lung cancer and other health conditions. Your cancer remains stable with no new spread, and your back pain has improved after kyphoplasty. We also discussed your episodes of low blood sugar, chest burning with Viagra , and your progress in reducing smoking.  YOUR PLAN:  -PRIMARY LUNG ADENOCARCINOMA WITH METASTASES TO SPINE AND BRAIN: Your lung cancer has spread to your spine and brain but is currently stable. You will continue with your chemotherapy, and your next treatment is scheduled for October 6th. We will also evaluate the need for adding Keytruda  to your treatment plan with your oncology team.  -TYPE 2 DIABETES MELLITUS WITH HYPOGLYCEMIA AND DIABETIC POLYNEUROPATHY: You have diabetes with episodes of low blood sugar and nerve pain. To help prevent low blood sugar, have a consistent evening snack. Continue taking 26 units of insulin  glargine, and if low blood sugar continues, reduce the dose to 24 units. Keep taking gabapentin  for nerve pain.  -GASTROESOPHAGEAL REFLUX DISEASE: You have acid reflux, which may be causing chest burning. Continue your current treatment for reflux and monitor if your symptoms improve.  -ERECTILE DYSFUNCTION: You experience chest burning when taking Viagra , which may be a side effect. If the chest pain continues, stop taking Viagra  and monitor if your reflux treatment helps.  -HYPERLIPIDEMIA: You have high cholesterol, which is being managed with atorvastatin . Continue taking atorvastatin  as prescribed.  -NICOTINE  DEPENDENCE, CURRENT: You are working on reducing smoking with the help of bupropion . Continue taking bupropion  to support your efforts to quit smoking.  INSTRUCTIONS:  Your next chemotherapy infusion is scheduled for October 6th. Please have a consistent evening snack to help prevent low blood sugar. If you continue to experience low blood sugar, reduce your  insulin  glargine dose to 24 units. Monitor your chest burning symptoms and discontinue Viagra  if the pain persists. Continue all your current medications as prescribed. Follow up with your oncology team to evaluate the need for Keytruda .

## 2024-08-08 ENCOUNTER — Encounter: Payer: Self-pay | Admitting: Internal Medicine

## 2024-08-08 ENCOUNTER — Other Ambulatory Visit: Payer: Self-pay | Admitting: Family Medicine

## 2024-08-08 ENCOUNTER — Other Ambulatory Visit (HOSPITAL_COMMUNITY): Payer: Self-pay

## 2024-08-08 ENCOUNTER — Other Ambulatory Visit: Payer: Self-pay

## 2024-08-08 ENCOUNTER — Other Ambulatory Visit: Payer: Self-pay | Admitting: Nurse Practitioner

## 2024-08-08 DIAGNOSIS — J329 Chronic sinusitis, unspecified: Secondary | ICD-10-CM

## 2024-08-08 MED ORDER — OXYCODONE HCL 10 MG PO TABS
10.0000 mg | ORAL_TABLET | Freq: Four times a day (QID) | ORAL | 0 refills | Status: DC | PRN
  Filled 2024-08-08: qty 60, 15d supply, fill #0

## 2024-08-08 MED ORDER — FLUTICASONE PROPIONATE 50 MCG/ACT NA SUSP
2.0000 | Freq: Every day | NASAL | 1 refills | Status: AC
  Filled 2024-08-08: qty 16, 30d supply, fill #0
  Filled 2024-09-12: qty 16, 30d supply, fill #1
  Filled 2024-10-18: qty 16, 30d supply, fill #2
  Filled 2024-11-22: qty 16, 30d supply, fill #3

## 2024-08-13 ENCOUNTER — Encounter (HOSPITAL_COMMUNITY): Payer: MEDICAID | Admitting: Physician Assistant

## 2024-08-14 ENCOUNTER — Other Ambulatory Visit (HOSPITAL_COMMUNITY): Payer: Self-pay

## 2024-08-20 NOTE — Progress Notes (Signed)
 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    Biomarker Findings Microsatellite status - MS-Stable Tumor Mutational Burden - 8 Muts/Mb Genomic Findings For a complete list of the genes assayed, please refer to the Appendix. KEAP1 Q227* CDKN2A/B [id number] G89V PRKCI amplification TERC amplification - equivocal? TP53 [id number]*18 8 Disease relevant genes with no reportable alterations: ALK, BRAF, EGFR, ERBB2, KRAS, MET, RET, ROS1   PDL1 TPS  90%  PRIOR THERAPY:  1) Status post palliative radiotherapy to the L2 lesion under the care of Dr. Patrcia. 2) Status post SRS to solitary brain metastasis under the care of Dr. Patrcia 3) status post L2 laminectomy for decompression of the thecal sac under the care of Dr. Lanis on 02/27/2024.  CURRENT THERAPY: Palliative systemic therapy with carboplatin  for AUC of 5, Alimta  500 Mg/M2 and Keytruda  200 Mg IV every 3 weeks. First dose September 05, 2022. Status post 34 cycles. Starting from cycle #5, he started on maintenance keytruda  and alimta  IV every 3 weeks.   INTERVAL HISTORY: Samuel [name] 58 y.o. male returns to the clinic today for a follow up visit accompanied by his girlfriend. The patient was last seen in the clinic 3 weeks ago by myself.  The patient is feeling well. Today is his two year mark of starting treatment.   There have been no changes in his condition since the last visit, with no fevers, chills, or night sweats. His breathing remains unchanged. He has switched from Pepcid   to Prilosec for reflux management, which has been effective. No nausea, vomiting, diarrhea, or constipation.  He has developed skin lesions, described as small, raised, red, and dry. These lesions are itchy, and he has been using regular lotion for relief.  He experiences increased tearing of the eyes, which he attributes to allergies, as he is allergic to trees. He uses Flonase  and Zyrtec  for allergy management. He has seen an eye specialist for diabetes-related concerns but has not yet received glasses. The tearing is noted to be more frequent throughout the day.  He is currently on Alimta  and Keytruda  as part of his treatment regimen.   The purpose of his visit is for toxicity check prior to undergoing treatment today as planned with cycle #35.   MEDICAL HISTORY: Past Medical History:  Diagnosis Date   Alcohol  use disorder, severe, dependence (HCC) 12/26/2016   Anxiety    Depression    Diabetes mellitus without complication (HCC)    Metastatic cancer to brain (HCC) 08/05/2022   Metastatic cancer to spine (HCC) 07/19/2022   Pancreatitis unk   Primary lung adenocarcinoma (HCC) 08/05/2022   Suicidal ideation    Type 2 diabetes mellitus with hyperglycemia, without long-term current use of insulin  (HCC) 06/13/2022   Formatting of this note might be different from the original. 06/13/2022 A1C 13.8, FSBG 414 Start empagliflozin  5mg /metformin  1000 daily, levemir 20 qhs. (Samples given) Will apply for medassist for pharmacy    ALLERGIES:  is allergic to penicillins and percocet [oxycodone -acetaminophen ].  MEDICATIONS:  Current Outpatient Medications  Medication Sig Dispense Refill   acetaminophen  (TYLENOL ) 500 MG tablet  Take 1,000 mg by mouth every 6 (six) hours as needed for moderate pain (pain score 4-6).     atorvastatin  (LIPITOR) 40 MG tablet Take 1 tablet (40 mg total) by mouth daily. 90 tablet 1   buPROPion  (WELLBUTRIN  XL) 150 MG 24 hr tablet Take 1 tablet (150 mg total) by mouth  daily. 90 tablet 0   cetirizine  (ZYRTEC ) 10 MG tablet Take 1 tablet (10 mg total) by mouth daily. 30 tablet 1   Continuous Glucose Receiver (FREESTYLE LIBRE 3 READER) DEVI Use to check blood sugar continuously throughout the day. 1 each 0   Continuous Glucose Sensor (FREESTYLE LIBRE 3 PLUS SENSOR) MISC Change sensor every 15 days. Use to check blood sugar continuously. 2 each 6   empagliflozin  (JARDIANCE ) 25 MG TABS tablet Take 1 tablet (25 mg total) by mouth daily before breakfast. 90 tablet 1   escitalopram  (LEXAPRO ) 10 MG tablet Take 1 tablet (10 mg total) by mouth daily. 90 tablet 0   famotidine  (PEPCID ) 40 MG tablet Take 1 tablet (40 mg total) by mouth daily. 30 tablet 3   fluticasone  (FLONASE ) 50 MCG/ACT nasal spray Place 2 sprays into both nostrils daily. 48 g 1   folic acid  (FOLVITE ) 1 MG tablet Take 1 tablet (1 mg total) by mouth daily. 30 tablet 4   gabapentin  (NEURONTIN ) 300 MG capsule Take 2 capsules (600 mg total) by mouth 3 (three) times daily. 180 capsule 1   glucose blood (ACCU-CHEK GUIDE TEST) test strip Use as instructed twice daily 100 each 12   glucose blood (ACCU-CHEK GUIDE) test strip Use to check blood sugar 3 times daily. E11.69 100 each 6   insulin  glargine (LANTUS  SOLOSTAR) 100 UNIT/ML Solostar Pen Inject 26 Units into the skin daily. 30 mL 6   Insulin  Pen Needle 32G X 4 MM MISC Use with Insulin  pen 100 each 3   methocarbamol  (ROBAXIN ) 500 MG tablet Take 1 tablet (500 mg total) by mouth every 6 (six) hours as needed for muscle spasms. 90 tablet 0   omeprazole  (PRILOSEC) 20 MG capsule Take 1 capsule (20 mg total) by mouth daily. 30 capsule 2   oxyCODONE  (OXYCONTIN ) 10 mg 12 hr tablet Take 1 tablet (10 mg total) by mouth every 12 (twelve) hours. 60 tablet 0   Oxycodone  HCl 10 MG TABS Take 1 tablet (10 mg total) by mouth every 6 (six) hours as needed. 60 tablet 0   sildenafil  (VIAGRA ) 100 MG tablet Take 0.5-1 tablets (50-100 mg total) by mouth daily as needed for erectile  dysfunction. 5 tablet 11   No current facility-administered medications for this visit.    SURGICAL HISTORY:  Past Surgical History:  Procedure Laterality Date   APPLICATION OF ROBOTIC ASSISTANCE FOR SPINAL PROCEDURE N/A 02/27/2024   Procedure: APPLICATION OF ROBOTIC ASSISTANCE FOR SPINAL PROCEDURE;  Surgeon: Lanis Pupa, MD;  Location: [facility] OR;  Service: Neurosurgery;  Laterality: N/A;   IR BONE TUMOR(S)RF ABLATION  08/01/2022   IR IMAGING GUIDED PORT INSERTION  09/21/2022   IR KYPHO LUMBAR INC FX REDUCE BONE BX UNI/BIL CANNULATION INC/IMAGING  08/01/2022   LAMINECTOMY WITH POSTERIOR LATERAL ARTHRODESIS LEVEL 4 N/A 02/27/2024   Procedure: Lumbar two LAMINECTOMY, Thoracic twelve-Lumbar four INSTRUMENTED STABILIZATION;  Surgeon: Lanis Pupa, MD;  Location: [facility] OR;  Service: Neurosurgery;  Laterality: N/A;    REVIEW OF SYSTEMS:   Review of Systems  Constitutional: Positive for stable fatigue. Negative for appetite change, chills, fever and unexpected weight change.  HENT: Positive for tearing. Negative  for mouth sores, nosebleeds, sore throat and trouble swallowing.   Eyes: Negative for eye problems and icterus.  Respiratory: Negative for cough, hemoptysis, shortness of breath and wheezing.   Cardiovascular: Negative for chest pain and leg swelling.  Gastrointestinal: Negative for abdominal pain, constipation, diarrhea, nausea and vomiting.  Genitourinary: Negative for bladder incontinence, difficulty urinating, dysuria, frequency and hematuria.   Musculoskeletal: Positive for chronic back pain (greatly improved since diagnosed). Negative for gait problem, neck pain and neck stiffness.  Skin: Positive for a few scattered skin lesions.  Neurological: Negative for dizziness, extremity weakness, gait problem, headaches, light-headedness and seizures.  Hematological: Negative for adenopathy. Does not bruise/bleed easily.  Psychiatric/Behavioral: Negative for confusion, depression and  sleep disturbance. The patient is not nervous/anxious.     PHYSICAL EXAMINATION:  Blood pressure (!) 140/85, pulse 80, temperature 97.6 F (36.4 C), temperature source Temporal, resp. rate 15, weight 155 lb 9.6 oz (70.6 kg), SpO2 100%.  ECOG PERFORMANCE STATUS: 1  Physical Exam  Constitutional: Oriented to person, place, and time and well-developed, well-nourished, and in no distress.  HENT:  Head: Normocephalic and atraumatic.  Mouth/Throat: Oropharynx is clear and moist. No oropharyngeal exudate.  Eyes: Conjunctivae are normal. Right eye exhibits no discharge. Left eye exhibits no discharge. No scleral icterus.  Neck: Normal range of motion. Neck supple.  Cardiovascular: Normal rate, regular rhythm, normal heart sounds and intact distal pulses.   Pulmonary/Chest: Effort normal and breath sounds normal. No respiratory distress. No wheezes. No rales.  Abdominal: Soft. Bowel sounds are normal. Exhibits no distension and no mass. There is no tenderness.  Musculoskeletal: Normal range of motion. Exhibits no edema. He is wearing a back brace.  Lymphadenopathy:    No cervical adenopathy.  Neurological: Alert and oriented to person, place, and time. Exhibits normal muscle tone. Gait normal. Coordination normal.  Skin: Skin is warm and dry. No rash noted. Not diaphoretic. No erythema. No pallor.  Psychiatric: Mood, memory and judgment normal.  Vitals reviewed.  LABORATORY DATA: Lab Results  Component Value Date   WBC 6.1 08/27/2024   HGB 12.5 (L) 08/27/2024   HCT 38.4 (L) 08/27/2024   MCV 98.5 08/27/2024   PLT 224 08/27/2024      Chemistry      Component Value Date/Time   NA 137 08/05/2024 1407   NA 140 12/24/2020 1144   NA 142 08/02/2014 1649   K 3.8 08/05/2024 1407   K 4.2 08/02/2014 1649   CL 104 08/05/2024 1407   CL 109 (H) 08/02/2014 1649   CO2 26 08/05/2024 1407   CO2 21 08/02/2014 1649   BUN 16 08/05/2024 1407   BUN 7 12/24/2020 1144   BUN 11 08/02/2014 1649    CREATININE 0.80 08/05/2024 1407   CREATININE 0.92 08/02/2014 1649      Component Value Date/Time   CALCIUM  8.8 (L) 08/05/2024 1407   CALCIUM  8.6 08/02/2014 1649   ALKPHOS 73 08/05/2024 1407   ALKPHOS 82 08/02/2014 1649   AST 20 08/05/2024 1407   ALT 23 08/05/2024 1407   ALT 132 (H) 08/02/2014 1649   BILITOT 0.4 08/05/2024 1407       RADIOGRAPHIC STUDIES:  No results found.   ASSESSMENT/PLAN:  This is a very pleasant 58 year old African-American male diagnosed with stage IV (T3, N2, M1 C) non-small cell lung cancer, adenocarcinoma.  He presented with a right pulmonary nodule in addition to right hilar mediastinal lymphadenopathy and pleural effusion, metastatic disease to the brain, and extensive metastatic bone disease  to the lumbar spine.  He underwent biopsy and kyphoplasty to the L2 and to the brain metastasis.  He was diagnosed in September 2023.   His molecular studies showed no actionable mutations and his PD-L1 expression is 90%.  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    He underwent palliative radiation to L2 compression fracture as well as a solitary brain metastasis with SRS.    He is currently being treated with palliative systemic chemotherapy with carboplatin  for an AUC of 5, Alimta  500 mg per metered square, Keytruda  200 mg IV every 3 weeks.  He status post 34 cycles and he tolerated well without any concerning adverse side effects.  Starting from cycle #5 he started maintenance Alimta  and Keytruda .  IV every 3 weeks   Radiation oncology is monitoring his brain MRI and lumbar spine closely every few months.   Labs were reviewed. Recommend he proceed with cycle #35 today as scheduled.    We will see him back for follow-up visit in 3 weeks for evaluation repeat blood work before undergoing cycle #36. We will decide if he will proceed with single agent  Alimta  at that time.   I will arrange for a restaging CT of the CAP prior to his next appointment in 3 weeks.   Chemotherapy-induced epiphora Increased tearing likely due to Alimta  causing spasms in lacrimal duct.  - Recommend using Refresh Tears (artificial tears).   Gastroesophageal reflux disease Prilosec effective for GERD management.  Allergic rhinitis Managed with Flonase  and Zyrtec . No changes in symptoms. - Continue Flonase  and Zyrtec .  Chemotherapy-induced skin eruptions Raised, red, dry, itchy skin eruptions likely due to immunotherapy. - Recommend using steroid cream for itchy skin eruptions.  The patient was advised to call immediately if he has any concerning symptoms in the interval. The patient voices understanding of current disease status and treatment options and is in agreement with the current care plan. All questions were answered. The patient knows to call the clinic with any problems, questions or concerns. We can certainly see the patient much sooner if necessary   I spent 8 minutes counseling the patient face to face. The total time spent in the appointment was 20 minutes.    Orders Placed This Encounter  Procedures   CT CHEST ABDOMEN PELVIS W CONTRAST    Standing Status:   Future    Expected Date:   09/10/2024    Expiration Date:   08/27/2025    If indicated for the ordered procedure, I authorize the administration of contrast media per Radiology protocol:   Yes    Does the patient have a contrast media/X-ray dye allergy?:   No    Preferred imaging location?:   [facility]    If indicated for the ordered procedure, I authorize the administration of oral contrast media per Radiology protocol:   Yes     Chaka Jefferys L Johntavius Shepard, PA-C 08/27/24  ADDENDUM: Hematology/Oncology Attending:  I had a face-to-face encounter with the patient today.  I reviewed his record, lab and recommended his care plan.  This is a very pleasant 58 years old  African-American male with a stage IV non-small cell lung cancer diagnosed in September 2023 with no actionable mutations and PD-L1 expression of 90%.  The patient started palliative systemic chemoimmunotherapy with carboplatin , Alimta  and Keytruda  every 3 weeks for 4 cycles and starting from cycle #5 he  has been on maintenance treatment with Alimta  and Keytruda  every 3 weeks.  He is status post 34 cycles.  The patient is feeling fine with no concerning complaints.  He will proceed with cycle #35 of his treatment with maintenance Alimta  and Keytruda .  We will arrange repeat scan of the chest, abdomen and pelvis in 3 weeks.  If the patient has no evidence for disease progression, we may consider discontinuing Keytruda  since he completed 2 years of this treatment and stay on single agent Alimta  as maintenance. The patient celebrated the 2 years of treatment today. He was advised to call immediately if he has any other concerning symptoms in the interval. The total time spent in the appointment was 20 minutes including review of chart and various tests results, discussions about plan of care and coordination of care plan . Disclaimer: This note was dictated with voice recognition software. Similar sounding words can inadvertently be transcribed and may be missed upon review. Sherrod MARLA Sherrod, MD

## 2024-08-22 ENCOUNTER — Other Ambulatory Visit: Payer: Self-pay | Admitting: Nurse Practitioner

## 2024-08-22 ENCOUNTER — Encounter: Payer: Self-pay | Admitting: Internal Medicine

## 2024-08-22 ENCOUNTER — Other Ambulatory Visit (HOSPITAL_COMMUNITY): Payer: Self-pay

## 2024-08-22 DIAGNOSIS — G893 Neoplasm related pain (acute) (chronic): Secondary | ICD-10-CM

## 2024-08-22 DIAGNOSIS — Z515 Encounter for palliative care: Secondary | ICD-10-CM

## 2024-08-22 DIAGNOSIS — C3490 Malignant neoplasm of unspecified part of unspecified bronchus or lung: Secondary | ICD-10-CM

## 2024-08-22 MED ORDER — OXYCODONE HCL ER 10 MG PO T12A
10.0000 mg | EXTENDED_RELEASE_TABLET | Freq: Two times a day (BID) | ORAL | 0 refills | Status: DC
  Filled 2024-08-22: qty 60, 30d supply, fill #0

## 2024-08-22 MED ORDER — OXYCODONE HCL 10 MG PO TABS
10.0000 mg | ORAL_TABLET | Freq: Four times a day (QID) | ORAL | 0 refills | Status: DC | PRN
  Filled 2024-08-22: qty 60, 15d supply, fill #0

## 2024-08-23 ENCOUNTER — Encounter: Payer: Self-pay | Admitting: Internal Medicine

## 2024-08-23 ENCOUNTER — Other Ambulatory Visit (HOSPITAL_COMMUNITY): Payer: Self-pay

## 2024-08-26 ENCOUNTER — Inpatient Hospital Stay: Payer: MEDICAID

## 2024-08-26 ENCOUNTER — Inpatient Hospital Stay: Payer: MEDICAID | Admitting: Internal Medicine

## 2024-08-27 ENCOUNTER — Inpatient Hospital Stay: Payer: MEDICAID

## 2024-08-27 ENCOUNTER — Inpatient Hospital Stay: Payer: MEDICAID | Attending: Adult Health

## 2024-08-27 ENCOUNTER — Inpatient Hospital Stay (HOSPITAL_BASED_OUTPATIENT_CLINIC_OR_DEPARTMENT_OTHER): Payer: MEDICAID | Admitting: Physician Assistant

## 2024-08-27 VITALS — BP 140/85 | HR 80 | Temp 97.6°F | Resp 15 | Wt 155.6 lb

## 2024-08-27 DIAGNOSIS — C3490 Malignant neoplasm of unspecified part of unspecified bronchus or lung: Secondary | ICD-10-CM

## 2024-08-27 DIAGNOSIS — E119 Type 2 diabetes mellitus without complications: Secondary | ICD-10-CM | POA: Insufficient documentation

## 2024-08-27 DIAGNOSIS — T451X5A Adverse effect of antineoplastic and immunosuppressive drugs, initial encounter: Secondary | ICD-10-CM | POA: Insufficient documentation

## 2024-08-27 DIAGNOSIS — K219 Gastro-esophageal reflux disease without esophagitis: Secondary | ICD-10-CM | POA: Insufficient documentation

## 2024-08-27 DIAGNOSIS — L989 Disorder of the skin and subcutaneous tissue, unspecified: Secondary | ICD-10-CM | POA: Diagnosis not present

## 2024-08-27 DIAGNOSIS — C3411 Malignant neoplasm of upper lobe, right bronchus or lung: Secondary | ICD-10-CM | POA: Insufficient documentation

## 2024-08-27 DIAGNOSIS — Z8719 Personal history of other diseases of the digestive system: Secondary | ICD-10-CM | POA: Insufficient documentation

## 2024-08-27 DIAGNOSIS — H04209 Unspecified epiphora, unspecified lacrimal gland: Secondary | ICD-10-CM | POA: Diagnosis not present

## 2024-08-27 DIAGNOSIS — C7931 Secondary malignant neoplasm of brain: Secondary | ICD-10-CM | POA: Diagnosis not present

## 2024-08-27 DIAGNOSIS — Z5112 Encounter for antineoplastic immunotherapy: Secondary | ICD-10-CM | POA: Diagnosis present

## 2024-08-27 DIAGNOSIS — J301 Allergic rhinitis due to pollen: Secondary | ICD-10-CM | POA: Diagnosis not present

## 2024-08-27 DIAGNOSIS — C7951 Secondary malignant neoplasm of bone: Secondary | ICD-10-CM | POA: Diagnosis not present

## 2024-08-27 DIAGNOSIS — F1020 Alcohol dependence, uncomplicated: Secondary | ICD-10-CM | POA: Diagnosis not present

## 2024-08-27 DIAGNOSIS — Z7984 Long term (current) use of oral hypoglycemic drugs: Secondary | ICD-10-CM | POA: Insufficient documentation

## 2024-08-27 DIAGNOSIS — Z5111 Encounter for antineoplastic chemotherapy: Secondary | ICD-10-CM | POA: Diagnosis not present

## 2024-08-27 DIAGNOSIS — Z79899 Other long term (current) drug therapy: Secondary | ICD-10-CM | POA: Insufficient documentation

## 2024-08-27 LAB — CMP (CANCER CENTER ONLY)
ALT: 27 U/L (ref 0–44)
AST: 26 U/L (ref 15–41)
Albumin: 3.7 g/dL (ref 3.5–5.0)
Alkaline Phosphatase: 80 U/L (ref 38–126)
Anion gap: 6 (ref 5–15)
BUN: 12 mg/dL (ref 6–20)
CO2: 27 mmol/L (ref 22–32)
Calcium: 9.3 mg/dL (ref 8.9–10.3)
Chloride: 106 mmol/L (ref 98–111)
Creatinine: 0.64 mg/dL (ref 0.61–1.24)
GFR, Estimated: >60 mL/min (ref >60)
Glucose, Bld: 267 mg/dL — ABNORMAL HIGH (ref 70–99)
Potassium: 3.8 mmol/L (ref 3.5–5.1)
Sodium: 139 mmol/L (ref 135–145)
Total Bilirubin: 0.3 mg/dL (ref 0.0–1.2)
Total Protein: 7.7 g/dL (ref 6.5–8.1)

## 2024-08-27 LAB — CBC WITH DIFFERENTIAL (CANCER CENTER ONLY)
Abs Immature Granulocytes: 0.02 K/uL (ref 0.00–0.07)
Basophils Absolute: 0 K/uL (ref 0.0–0.1)
Basophils Relative: 1 %
Eosinophils Absolute: 0.1 K/uL (ref 0.0–0.5)
Eosinophils Relative: 1 %
HCT: 38.4 % — ABNORMAL LOW (ref 39.0–52.0)
Hemoglobin: 12.5 g/dL — ABNORMAL LOW (ref 13.0–17.0)
Immature Granulocytes: 0 %
Lymphocytes Relative: 17 %
Lymphs Abs: 1 K/uL (ref 0.7–4.0)
MCH: 32.1 pg (ref 26.0–34.0)
MCHC: 32.6 g/dL (ref 30.0–36.0)
MCV: 98.5 fL (ref 80.0–100.0)
Monocytes Absolute: 0.6 K/uL (ref 0.1–1.0)
Monocytes Relative: 9 %
Neutro Abs: 4.4 K/uL (ref 1.7–7.7)
Neutrophils Relative %: 72 %
Platelet Count: 224 K/uL (ref 150–400)
RBC: 3.9 MIL/uL — ABNORMAL LOW (ref 4.22–5.81)
RDW: 13.6 % (ref 11.5–15.5)
WBC Count: 6.1 K/uL (ref 4.0–10.5)
nRBC: 0 % (ref 0.0–0.2)

## 2024-08-27 MED ORDER — SODIUM CHLORIDE 0.9 % IV SOLN: Freq: Once | INTRAVENOUS | Status: AC

## 2024-08-27 MED ORDER — PROCHLORPERAZINE MALEATE 10 MG PO TABS
10.0000 mg | ORAL_TABLET | Freq: Once | ORAL | Status: AC
  Administered 2024-08-27: 10 mg via ORAL
  Filled 2024-08-27: qty 1

## 2024-08-27 MED ORDER — SODIUM CHLORIDE 0.9 % IV SOLN
200.0000 mg | Freq: Once | INTRAVENOUS | Status: AC
  Administered 2024-08-27: 200 mg via INTRAVENOUS
  Filled 2024-08-27: qty 200

## 2024-08-27 MED ORDER — SODIUM CHLORIDE 0.9 % IV SOLN
500.0000 mg/m2 | Freq: Once | INTRAVENOUS | Status: AC
  Administered 2024-08-27: 900 mg via INTRAVENOUS
  Filled 2024-08-27: qty 20

## 2024-08-27 NOTE — Patient Instructions (Signed)
 [facility] - A DEPT OF [facility]  Discharge Instructions: Thank you for choosing [facility] to provide your oncology and hematology care.   If you have a lab appointment with the Cancer Center, please go directly to the Cancer Center and check in at the registration area.   Wear comfortable clothing and clothing appropriate for easy access to any Portacath or PICC line.   We strive to give you quality time with your provider. You may need to reschedule your appointment if you arrive late (15 or more minutes).  Arriving late affects you and other patients whose appointments are after yours.  Also, if you miss three or more appointments without notifying the office, you may be dismissed from the clinic at the provider's discretion.      For prescription refill requests, have your pharmacy contact our office and allow 72 hours for refills to be completed.    Today you received the following chemotherapy and/or immunotherapy agents: Keytruda, Alimta.       To help prevent nausea and vomiting after your treatment, we encourage you to take your nausea medication as directed.  BELOW ARE SYMPTOMS THAT SHOULD BE REPORTED IMMEDIATELY: *FEVER GREATER THAN 100.4 F (38 C) OR HIGHER *CHILLS OR SWEATING *NAUSEA AND VOMITING THAT IS NOT CONTROLLED WITH YOUR NAUSEA MEDICATION *UNUSUAL SHORTNESS OF BREATH *UNUSUAL BRUISING OR BLEEDING *URINARY PROBLEMS (pain or burning when urinating, or frequent urination) *BOWEL PROBLEMS (unusual diarrhea, constipation, pain near the anus) TENDERNESS IN MOUTH AND THROAT WITH OR WITHOUT PRESENCE OF ULCERS (sore throat, sores in mouth, or a toothache) UNUSUAL RASH, SWELLING OR PAIN  UNUSUAL VAGINAL DISCHARGE OR ITCHING   Items with * indicate a potential emergency and should be followed up as soon as possible or go to the Emergency Department if any problems should occur.  Please show the CHEMOTHERAPY ALERT CARD or  IMMUNOTHERAPY ALERT CARD at check-in to the Emergency Department and triage nurse.  Should you have questions after your visit or need to cancel or reschedule your appointment, please contact [facility] - A DEPT OF [facility]  Dept: [phone]  and follow the prompts.  Office hours are 8:00 a.m. to 4:30 p.m. Monday - Friday. Please note that voicemails left after 4:00 p.m. may not be returned until the following business day.  We are closed weekends and major holidays. You have access to a nurse at all times for urgent questions. Please call the main number to the clinic Dept: [phone] and follow the prompts.   For any non-urgent questions, you may also contact your provider using MyChart. We now offer e-Visits for anyone 61 and older to request care online for non-urgent symptoms. For details visit [email or web address].   Also download the MyChart app! Go to the app store, search "MyChart", open the app, select , and log in with your MyChart username and password.

## 2024-08-29 ENCOUNTER — Encounter: Payer: Self-pay | Admitting: Internal Medicine

## 2024-08-29 ENCOUNTER — Other Ambulatory Visit: Payer: Self-pay

## 2024-09-06 ENCOUNTER — Other Ambulatory Visit (HOSPITAL_COMMUNITY): Payer: Self-pay

## 2024-09-06 ENCOUNTER — Other Ambulatory Visit: Payer: Self-pay | Admitting: Nurse Practitioner

## 2024-09-06 ENCOUNTER — Encounter: Payer: Self-pay | Admitting: Internal Medicine

## 2024-09-06 MED ORDER — OXYCODONE HCL 10 MG PO TABS
10.0000 mg | ORAL_TABLET | Freq: Four times a day (QID) | ORAL | 0 refills | Status: DC | PRN
  Filled 2024-09-06: qty 60, 15d supply, fill #0

## 2024-09-10 ENCOUNTER — Ambulatory Visit (HOSPITAL_COMMUNITY)
Admission: RE | Admit: 2024-09-10 | Discharge: 2024-09-10 | Disposition: A | Discharge status: Home / Self Care | Payer: MEDICAID | Source: Ambulatory Visit | Attending: Physician Assistant | Admitting: Physician Assistant

## 2024-09-10 DIAGNOSIS — C3490 Malignant neoplasm of unspecified part of unspecified bronchus or lung: Secondary | ICD-10-CM | POA: Insufficient documentation

## 2024-09-10 MED ORDER — IOHEXOL 300 MG/ML  SOLN
100.0000 mL | Freq: Once | INTRAMUSCULAR | Status: AC | PRN
  Administered 2024-09-10: 100 mL via INTRAVENOUS

## 2024-09-11 NOTE — Progress Notes (Incomplete)
 Follow up call to receive results from brain MRI on 10/02/24  Pain/headaches/vision?

## 2024-09-13 ENCOUNTER — Encounter: Payer: Self-pay | Admitting: Internal Medicine

## 2024-09-13 ENCOUNTER — Other Ambulatory Visit (HOSPITAL_COMMUNITY): Payer: Self-pay

## 2024-09-16 ENCOUNTER — Inpatient Hospital Stay: Payer: MEDICAID

## 2024-09-16 ENCOUNTER — Other Ambulatory Visit (HOSPITAL_COMMUNITY): Payer: Self-pay

## 2024-09-16 ENCOUNTER — Inpatient Hospital Stay: Payer: MEDICAID | Admitting: Internal Medicine

## 2024-09-16 VITALS — BP 146/72 | HR 83 | Temp 97.6°F | Resp 17 | Ht 68.0 in | Wt 158.0 lb

## 2024-09-16 DIAGNOSIS — C3491 Malignant neoplasm of unspecified part of right bronchus or lung: Secondary | ICD-10-CM | POA: Diagnosis not present

## 2024-09-16 DIAGNOSIS — Z5112 Encounter for antineoplastic immunotherapy: Secondary | ICD-10-CM | POA: Diagnosis not present

## 2024-09-16 DIAGNOSIS — C3490 Malignant neoplasm of unspecified part of unspecified bronchus or lung: Secondary | ICD-10-CM

## 2024-09-16 LAB — CBC WITH DIFFERENTIAL (CANCER CENTER ONLY)
Abs Immature Granulocytes: 0.01 K/uL (ref 0.00–0.07)
Basophils Absolute: 0 K/uL (ref 0.0–0.1)
Basophils Relative: 0 %
Eosinophils Absolute: 0.1 K/uL (ref 0.0–0.5)
Eosinophils Relative: 1 %
HCT: 38.3 % — ABNORMAL LOW (ref 39.0–52.0)
Hemoglobin: 12.9 g/dL — ABNORMAL LOW (ref 13.0–17.0)
Immature Granulocytes: 0 %
Lymphocytes Relative: 21 %
Lymphs Abs: 1.5 K/uL (ref 0.7–4.0)
MCH: 32.7 pg (ref 26.0–34.0)
MCHC: 33.7 g/dL (ref 30.0–36.0)
MCV: 97 fL (ref 80.0–100.0)
Monocytes Absolute: 0.6 K/uL (ref 0.1–1.0)
Monocytes Relative: 8 %
Neutro Abs: 5 K/uL (ref 1.7–7.7)
Neutrophils Relative %: 70 %
Platelet Count: 253 K/uL (ref 150–400)
RBC: 3.95 MIL/uL — ABNORMAL LOW (ref 4.22–5.81)
RDW: 13.3 % (ref 11.5–15.5)
WBC Count: 7.1 K/uL (ref 4.0–10.5)
nRBC: 0 % (ref 0.0–0.2)

## 2024-09-16 LAB — CMP (CANCER CENTER ONLY)
ALT: 22 U/L (ref 0–44)
AST: 20 U/L (ref 15–41)
Albumin: 3.7 g/dL (ref 3.5–5.0)
Alkaline Phosphatase: 73 U/L (ref 38–126)
Anion gap: 5 (ref 5–15)
BUN: 12 mg/dL (ref 6–20)
CO2: 28 mmol/L (ref 22–32)
Calcium: 8.9 mg/dL (ref 8.9–10.3)
Chloride: 106 mmol/L (ref 98–111)
Creatinine: 0.64 mg/dL (ref 0.61–1.24)
GFR, Estimated: >60 mL/min (ref >60)
Glucose, Bld: 216 mg/dL — ABNORMAL HIGH (ref 70–99)
Potassium: 3.6 mmol/L (ref 3.5–5.1)
Sodium: 139 mmol/L (ref 135–145)
Total Bilirubin: 0.2 mg/dL (ref 0.0–1.2)
Total Protein: 7.3 g/dL (ref 6.5–8.1)

## 2024-09-16 MED ORDER — SODIUM CHLORIDE 0.9 % IV SOLN
200.0000 mg | Freq: Once | INTRAVENOUS | Status: AC
  Administered 2024-09-16: 200 mg via INTRAVENOUS
  Filled 2024-09-16: qty 200

## 2024-09-16 MED ORDER — PROCHLORPERAZINE MALEATE 10 MG PO TABS
10.0000 mg | ORAL_TABLET | Freq: Once | ORAL | Status: AC
  Administered 2024-09-16: 10 mg via ORAL
  Filled 2024-09-16: qty 1

## 2024-09-16 MED ORDER — SODIUM CHLORIDE 0.9 % IV SOLN: Freq: Once | INTRAVENOUS | Status: AC

## 2024-09-16 MED ORDER — SODIUM CHLORIDE 0.9 % IV SOLN
500.0000 mg/m2 | Freq: Once | INTRAVENOUS | Status: AC
  Administered 2024-09-16: 900 mg via INTRAVENOUS
  Filled 2024-09-16: qty 20

## 2024-09-16 MED ORDER — CYANOCOBALAMIN 1000 MCG/ML IJ SOLN
1000.0000 ug | Freq: Once | INTRAMUSCULAR | Status: AC
  Administered 2024-09-16: 1000 ug via INTRAMUSCULAR
  Filled 2024-09-16: qty 1

## 2024-09-16 NOTE — Progress Notes (Signed)
 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  Biomarker Findings Microsatellite status - MS-Stable Tumor Mutational Burden - 8 Muts/Mb Genomic Findings For a complete list of the genes assayed, please refer to the Appendix. KEAP1 Q227* CDKN2A/B [id number] G89V PRKCI amplification TERC amplification - equivocal? TP53 [id number]*18 8 Disease relevant genes with no reportable alterations: ALK, BRAF, EGFR, ERBB2, KRAS, MET, RET, ROS1  PDL1 TPS  90%  PRIOR THERAPY:  1) Status post palliative radiotherapy to the L2 lesion under the care of Dr. Patrcia. 2) Status post SRS to solitary brain metastasis under the care of Dr. Patrcia. 3) status post L2 laminectomy for decompression of the thecal sac under the care of Dr. Lanis on 02/27/2024.  CURRENT THERAPY: Palliative systemic therapy with carboplatin  for AUC of 5, Alimta  500 Mg/M2 and Keytruda  200 Mg IV every 3 weeks.  First dose September 05, 2022.  Status post 35 cycles.  Starting from cycle #5 the patient will be on maintenance treatment with Alimta  and Keytruda  every 3 weeks.  INTERVAL HISTORY: Samuel [name] 58 y.o. male returns to the clinic today for follow-up visit.Discussed the use of AI scribe software for clinical note transcription with the patient, who gave verbal consent to proceed.  History of Present Illness Samuel [name] is a 58 year old male with stage four non-small cell lung cancer who presents for evaluation  before starting cycle number thirty-six of maintenance treatment.  He was diagnosed with stage four non-small cell lung cancer, adenocarcinoma, in September 2023. There is no actionable mutation, and PD-L1 expression is 90%. He initially received palliative systemic chemo-immunotherapy with carboplatin  and Alimta  every three weeks for four cycles. Since cycle five, he has been on maintenance treatment with Alimta  and Keytruda  every three weeks, and he is now status post thirty-five cycles.  He experiences dull, consistent pain in the left upper quadrant, located under the left rib cage, sometimes radiating to the lower back. He has no history of kidney stones, hematuria, or dysuria. He reports that he has been hydrating well.  He experienced nausea and diarrhea for five consecutive days last week, which has since resolved. He was constipated yesterday but had a bowel movement today. He continues to hydrate well.  He reports occasional spasms in the middle of his chest, which he describes as more of a spasm than heartburn. He has a history of heartburn and was previously on Pepcid , but is now taking Prilosec, which he finds helpful.    MEDICAL HISTORY: Past Medical History:  Diagnosis Date   Alcohol  use disorder, severe, dependence (HCC) 12/26/2016   Anxiety    Depression    Diabetes mellitus without complication (HCC)    Metastatic cancer to brain (HCC) 08/05/2022   Metastatic cancer to spine (HCC) 07/19/2022   Pancreatitis unk   Primary lung adenocarcinoma (HCC) 08/05/2022   Suicidal ideation    Type 2 diabetes mellitus with hyperglycemia, without long-term current use of insulin  (HCC) 06/13/2022   Formatting of this note might be different from the  original. 06/13/2022 A1C 13.8, FSBG 414 Start empagliflozin  5mg /metformin  1000 daily, levemir 20 qhs. (Samples given) Will apply for medassist for pharmacy    ALLERGIES:  is allergic to penicillins and percocet  [oxycodone -acetaminophen ].  MEDICATIONS:  Current Outpatient Medications  Medication Sig Dispense Refill   acetaminophen  (TYLENOL ) 500 MG tablet Take 1,000 mg by mouth every 6 (six) hours as needed for moderate pain (pain score 4-6).     atorvastatin  (LIPITOR) 40 MG tablet Take 1 tablet (40 mg total) by mouth daily. 90 tablet 1   buPROPion  (WELLBUTRIN  XL) 150 MG 24 hr tablet Take 1 tablet (150 mg total) by mouth daily. 90 tablet 0   cetirizine  (ZYRTEC ) 10 MG tablet Take 1 tablet (10 mg total) by mouth daily. 30 tablet 1   Continuous Glucose Receiver (FREESTYLE LIBRE 3 READER) DEVI Use to check blood sugar continuously throughout the day. 1 each 0   Continuous Glucose Sensor (FREESTYLE LIBRE 3 PLUS SENSOR) MISC Change sensor every 15 days. Use to check blood sugar continuously. 2 each 6   empagliflozin  (JARDIANCE ) 25 MG TABS tablet Take 1 tablet (25 mg total) by mouth daily before breakfast. 90 tablet 1   escitalopram  (LEXAPRO ) 10 MG tablet Take 1 tablet (10 mg total) by mouth daily. 90 tablet 0   famotidine  (PEPCID ) 40 MG tablet Take 1 tablet (40 mg total) by mouth daily. 30 tablet 3   fluticasone  (FLONASE ) 50 MCG/ACT nasal spray Place 2 sprays into both nostrils daily. 48 g 1   folic acid  (FOLVITE ) 1 MG tablet Take 1 tablet (1 mg total) by mouth daily. 30 tablet 4   gabapentin  (NEURONTIN ) 300 MG capsule Take 2 capsules (600 mg total) by mouth 3 (three) times daily. 180 capsule 1   glucose blood (ACCU-CHEK GUIDE TEST) test strip Use as instructed twice daily 100 each 12   glucose blood (ACCU-CHEK GUIDE) test strip Use to check blood sugar 3 times daily. E11.69 100 each 6   insulin  glargine (LANTUS  SOLOSTAR) 100 UNIT/ML Solostar Pen Inject 26 Units into the skin daily. 30 mL 6   Insulin  Pen Needle 32G X 4 MM MISC Use with Insulin  pen 100 each 3   methocarbamol  (ROBAXIN ) 500 MG tablet Take 1 tablet (500 mg total) by mouth every 6 (six) hours as needed for muscle spasms. 90 tablet 0   omeprazole   (PRILOSEC) 20 MG capsule Take 1 capsule (20 mg total) by mouth daily. 30 capsule 2   oxyCODONE  (OXYCONTIN ) 10 mg 12 hr tablet Take 1 tablet (10 mg total) by mouth every 12 (twelve) hours. 60 tablet 0   Oxycodone  HCl 10 MG TABS Take 1 tablet (10 mg total) by mouth every 6 (six) hours as needed. 60 tablet 0   sildenafil  (VIAGRA ) 100 MG tablet Take 0.5-1 tablets (50-100 mg total) by mouth daily as needed for erectile dysfunction. 5 tablet 11   No current facility-administered medications for this visit.    SURGICAL HISTORY:  Past Surgical History:  Procedure Laterality Date   APPLICATION OF ROBOTIC ASSISTANCE FOR SPINAL PROCEDURE N/A 02/27/2024   Procedure: APPLICATION OF ROBOTIC ASSISTANCE FOR SPINAL PROCEDURE;  Surgeon: Lanis Pupa, MD;  Location: [facility] OR;  Service: Neurosurgery;  Laterality: N/A;   IR BONE TUMOR(S)RF ABLATION  08/01/2022   IR IMAGING GUIDED PORT INSERTION  09/21/2022   IR KYPHO LUMBAR INC FX REDUCE BONE BX UNI/BIL CANNULATION INC/IMAGING  08/01/2022   LAMINECTOMY WITH POSTERIOR LATERAL ARTHRODESIS LEVEL 4 N/A 02/27/2024   Procedure: Lumbar two LAMINECTOMY, Thoracic  twelve-Lumbar four INSTRUMENTED STABILIZATION;  Surgeon: Lanis Pupa, MD;  Location: [facility] OR;  Service: Neurosurgery;  Laterality: N/A;    REVIEW OF SYSTEMS:  Constitutional: positive for fatigue Eyes: negative Ears, nose, mouth, throat, and face: negative Respiratory: negative Cardiovascular: negative Gastrointestinal: positive for abdominal pain and dyspepsia Genitourinary:negative Integument/breast: negative Hematologic/lymphatic: negative Musculoskeletal:negative Neurological: negative Behavioral/Psych: negative Endocrine: negative Allergic/Immunologic: negative   PHYSICAL EXAMINATION: General appearance: alert, cooperative, fatigued, and no distress Head: Normocephalic, without obvious abnormality, atraumatic Neck: no adenopathy, no JVD, supple, symmetrical, trachea midline, and thyroid  not  enlarged, symmetric, no tenderness/mass/nodules Lymph nodes: Cervical, supraclavicular, and axillary nodes normal. Resp: clear to auscultation bilaterally Back: symmetric, no curvature. ROM normal. No CVA tenderness. Cardio: regular rate and rhythm, S1, S2 normal, no murmur, click, rub or gallop GI: soft, non-tender; bowel sounds normal; no masses,  no organomegaly Extremities: extremities normal, atraumatic, no cyanosis or edema Neurologic: Alert and oriented X 3, normal strength and tone. Normal symmetric reflexes. Normal coordination and gait  ECOG PERFORMANCE STATUS: 1 - Symptomatic but completely ambulatory  Blood pressure (!) 146/72, pulse 83, temperature 97.6 F (36.4 C), temperature source Temporal, resp. rate 17, height 5' 8 (1.727 m), weight 158 lb (71.7 kg), SpO2 100%.  LABORATORY DATA: Lab Results  Component Value Date   WBC 7.1 09/16/2024   HGB 12.9 (L) 09/16/2024   HCT 38.3 (L) 09/16/2024   MCV 97.0 09/16/2024   PLT 253 09/16/2024      Chemistry      Component Value Date/Time   NA 139 08/27/2024 1124   NA 140 12/24/2020 1144   NA 142 08/02/2014 1649   K 3.8 08/27/2024 1124   K 4.2 08/02/2014 1649   CL 106 08/27/2024 1124   CL 109 (H) 08/02/2014 1649   CO2 27 08/27/2024 1124   CO2 21 08/02/2014 1649   BUN 12 08/27/2024 1124   BUN 7 12/24/2020 1144   BUN 11 08/02/2014 1649   CREATININE 0.64 08/27/2024 1124   CREATININE 0.92 08/02/2014 1649      Component Value Date/Time   CALCIUM  9.3 08/27/2024 1124   CALCIUM  8.6 08/02/2014 1649   ALKPHOS 80 08/27/2024 1124   ALKPHOS 82 08/02/2014 1649   AST 26 08/27/2024 1124   ALT 27 08/27/2024 1124   ALT 132 (H) 08/02/2014 1649   BILITOT 0.3 08/27/2024 1124       RADIOGRAPHIC STUDIES: CT CHEST ABDOMEN PELVIS W CONTRAST Result Date: 09/11/2024 CLINICAL DATA:  Lung cancer restaging. EXAM: CT CHEST, ABDOMEN, AND PELVIS WITH CONTRAST TECHNIQUE: Multidetector CT imaging of the chest, abdomen and pelvis was  performed following the standard protocol during bolus administration of intravenous contrast. RADIATION DOSE REDUCTION: This exam was performed according to the departmental dose-optimization program which includes automated exposure control, adjustment of the mA and/or kV according to patient size and/or use of iterative reconstruction technique. CONTRAST:  OMNIPAQUE  IOHEXOL  300 MG/ML  SOLN COMPARISON:  CT dated 06/19/2024. FINDINGS: CT CHEST FINDINGS Cardiovascular: There is no cardiomegaly or pericardial effusion. Mild atherosclerotic calcification of the thoracic aorta. No aneurysmal dilatation or dissection. The origins of the great vessels of the aortic arch and the central pulmonary arteries are patent. Right-sided Port-A-Cath with tip at the cavoatrial junction. Mediastinum/Nodes: No hilar or mediastinal adenopathy. Mildly thickened esophagus suspicious for esophagitis. Small amount of fluid content noted within the esophagus. No mediastinal fluid collection. Lungs/Pleura: Background of paraseptal emphysema. No focal consolidation, pleural effusion or pneumothorax. The central airways are patent. Musculoskeletal: Old healed bilateral  posterior rib fractures. Faint sclerotic changes of T3 similar to prior CT. No acute osseous pathology. No new osseous metastasis. CT ABDOMEN PELVIS FINDINGS No intra-abdominal free air or free fluid. Hepatobiliary: The liver is unremarkable. Mild biliary dilatation. The gallbladder is unremarkable. Pancreas: The pancreas is poorly visualized but grossly unremarkable. Spleen: Normal in size without focal abnormality. Adrenals/Urinary Tract: The adrenal glands unremarkable. Several tiny nonobstructing right renal calculi measure up to 2 mm. No hydronephrosis or shadowing stone. The left kidney, visualized ureters, and urinary bladder appear unremarkable. Stomach/Bowel: There is apparent infiltration of the fat plane involving the third portion of the duodenum which is  poorly visualized due to streak artifact caused by spinal hardware (63/5 and 74/2). Clinical correlation recommended to evaluate for possibility of duodenitis. Alternatively this may be related to infiltration of the retroperitoneal fat plane. This finding is similar to prior CT. There is no bowel obstruction. No evidence of acute appendicitis. Vascular/Lymphatic: Moderate aortoiliac atherosclerotic disease. The IVC is unremarkable. No portal venous gas. No obvious adenopathy. Reproductive: The prostate is grossly unremarkable. Other: None Musculoskeletal: Compression fracture of L2 vertebra with near complete loss of vertebral body height and vertebroplasty. Similar appearance of a 7 mm retropulsed fragment causing focal narrowing of the central canal. There is L2 laminectomy. Sclerotic changes of L3 and L4 as seen on the prior CT. T12-L4 posterior fusion hardware noted. No acute osseous pathology. IMPRESSION: 1. No acute intrathoracic, abdominal, or pelvic pathology. 2. Stable sclerotic changes of T3, L3, and L4. No new osseous metastasis. No evidence of extraosseous metastasis. 3. Mildly thickened esophagus suspicious for esophagitis. 4. Apparent infiltration of the fat plane involving the third portion of the duodenum. Clinical correlation recommended to evaluate for possibility of duodenitis. Alternatively this may be related to infiltration of the retroperitoneal fat plane. This finding is similar to prior CT. 5. Aortic Atherosclerosis (ICD10-I70.0) and Emphysema (ICD10-J43.9). Electronically Signed   By: Vanetta Chou M.D.   On: 09/11/2024 21:28      ASSESSMENT AND PLAN: This is a very pleasant 58 years old African-American male diagnosed with stage IV (T3a, N2, M1 C) non-small cell lung cancer, adenocarcinoma presented with right pulmonary nodules in addition to right hilar and mediastinal lymphadenopathy, pleural effusion, brain metastasis as well as extensive metastatic bone disease in the lumbar  spines status post biopsy with kyphoplasty at the L2 lesion and brain metastasis diagnosed in September 2023. He has molecular studies by foundation 1 that showed no actionable mutations and PD-L1 expression was 90%. He underwent palliative radiotherapy to the L2 vertebral body compression fraction as well as solitary brain metastasis with SRS. He is currently undergoing palliative systemic chemotherapy with carboplatin  for AUC of 5, Alimta  500 Mg/M2 and Keytruda  200 Mg IV every 3 weeks.  Status post 35 cycles. Starting from cycle #5 he is on treatment with maintenance Alimta  and Keytruda  every 3 weeks.  He has been tolerating this treatment well with no concerning adverse effects. He had repeat CT scan of the chest, abdomen and pelvis performed recently.  I personally and independently reviewed the scan and discussed the result with the patient today.  PET scan showed no concerning findings for disease progression but there was some suspicious for esophagitis as well as duodenitis. Assessment and Plan Assessment & Plan Stage IV non-small cell lung cancer, adenocarcinoma Diagnosed in September 2023 with no actionable mutation and PD-L1 expression of 90%. Currently on maintenance treatment with Alimta  and Keytruda  every three weeks, status post 35 cycles. He  prefers to continue treatment due to its effectiveness and lack of significant side effects. The decision to continue Keytruda  is supported by its effectiveness and high PD-L1 expression, with consideration to stop only if coverage issues arise or significant side effects develop. - Continue Alimta  and Keytruda  every three weeks  Abdominal pain Reports dull pain in the left upper quadrant, which he associates with past episodes of pancreatitis. However, the anatomical location described does not correspond with the pancreas. No signs of kidney stones or urinary issues reported. - Check pancreatic enzymes if symptoms persist or worsen - Advise to  hydrate well  Gastroesophageal reflux disease (GERD) Experiences spasms and occasional heartburn, previously managed with Pepcid  and currently on Prilosec. Symptoms may be related to past radiation treatment. - Continue Prilosec for symptom management He was advised to call immediately if he has any concerning symptoms in the interval. The patient voices understanding of current disease status and treatment options and is in agreement with the current care plan.  All questions were answered. The patient knows to call the clinic with any problems, questions or concerns. We can certainly see the patient much sooner if necessary.  The total time spent in the appointment was 30 minutes.  Disclaimer: This note was dictated with voice recognition software. Similar sounding words can inadvertently be transcribed and may not be corrected upon review.

## 2024-09-17 ENCOUNTER — Ambulatory Visit (HOSPITAL_COMMUNITY): Admission: RE | Admit: 2024-09-17 | Payer: MEDICAID | Source: Ambulatory Visit

## 2024-09-17 ENCOUNTER — Encounter: Payer: Self-pay | Admitting: Internal Medicine

## 2024-09-20 ENCOUNTER — Other Ambulatory Visit (HOSPITAL_COMMUNITY): Payer: Self-pay

## 2024-09-20 ENCOUNTER — Ambulatory Visit: Payer: MEDICAID | Attending: Family Medicine | Admitting: Pharmacist

## 2024-09-20 ENCOUNTER — Encounter: Payer: Self-pay | Admitting: Pharmacist

## 2024-09-20 DIAGNOSIS — Z794 Long term (current) use of insulin: Secondary | ICD-10-CM

## 2024-09-20 DIAGNOSIS — Z7984 Long term (current) use of oral hypoglycemic drugs: Secondary | ICD-10-CM | POA: Diagnosis not present

## 2024-09-20 DIAGNOSIS — E1142 Type 2 diabetes mellitus with diabetic polyneuropathy: Secondary | ICD-10-CM

## 2024-09-20 LAB — POCT GLYCOSYLATED HEMOGLOBIN (HGB A1C): HbA1c, POC (controlled diabetic range): 8.3 % — AB (ref 0.0–7.0)

## 2024-09-20 MED ORDER — LANTUS SOLOSTAR 100 UNIT/ML ~~LOC~~ SOPN
30.0000 [IU] | PEN_INJECTOR | Freq: Every day | SUBCUTANEOUS | 6 refills | Status: AC
  Filled 2024-09-20: qty 27, 90d supply, fill #0

## 2024-09-20 NOTE — Progress Notes (Signed)
    S:     No chief complaint on file.  58 y.o. male who presents for diabetes evaluation, education, and management. PMH is significant for stage IV NSCLC (currently undergoing palliative systemic chemotherapy +immunotherapy w/ Dr. Sherrod), T2DM w/ hx of microalbuminuria & retinopathy, HLD, GAD/MDD, GERD.   Patient was last seen by Primary Care Provider, Dr. Delbert, on 08/06/2024. His insulin  dose was decreased at that visit due to hypoglycemia.   Today, he brings in his CGM for review and his sugars at [facility] increased since his last visit. CGM report listed below. His A1c today is 8.3% (up from 7.6% three months prior).   Current diabetes medications include: Jardiance  25mg  daily, Lantus  26 units daily Patient reports adherence to taking all medications as prescribed.   Insurance coverage: Medicaid  Patient denies hypoglycemia.   Patient denies nocturia (nighttime urination).  Patient denies neuropathy (nerve pain). Patient denies visual changes.  Patient reports self foot exams.   Patient reported dietary habits: Eats 3-4 meals/day -Appetite has improved since his last visit with me. He is able to eat more. -Admits lately that his diet has slipped a little. He is not as strict as he was when I saw him last time.  Patient-reported exercise habits: -3-4 days 1.5 hours at a time  O:  Date of Download: 09/20/2024 Average Glucose:  -30-day avg: 186 mg/dL Glucose Management Indicator: n/a Time in Goal (30 days):  - Time in range 70-180: 51% - Time above range: 48% - Time below range: 0% Observed patterns: -Spikes and higher avg occurs between 12p-12a. -His lowest avgs are between 6a an 12p but even then, this avg over last 30 days is 169 mg/dL -Hypoglycemia has resolved since his last visit with me.  Lab Results  Component Value Date   HGBA1C 7.6 (A) 06/20/2024   There were no vitals filed for this visit.  Lipid Panel     Component Value Date/Time   CHOL 133  07/12/2023 1116   TRIG 64 07/12/2023 1116   HDL 41 07/12/2023 1116   CHOLHDL 3.2 07/12/2023 1116   CHOLHDL 2.4 03/11/2021 1843   VLDL 23 03/11/2021 1843   LDLCALC 79 07/12/2023 1116    Clinical Atherosclerotic Cardiovascular Disease (ASCVD): Yes  The ASCVD Risk score (Arnett DK, et al., 2019) failed to calculate for the following reasons:   Risk score cannot be calculated because patient has a medical history suggesting prior/existing ASCVD   A/P: Diabetes longstanding currently above goal. Glycemic control has worsened since last visit. With that being said, he acknowledges dietary indiscretion. He also plans to increase his exercise. We will increase his Lantus  from 26 to 30 units daily and have him returbn in 4-6 weeks for recheck.   -INCREASE Lantus  to 30 units daily in the morning.  -Continued SGLT2-I Jardiance  (empagliflozin ) 10 mg. Counseled on sick day rules. -Extensively discussed pathophysiology of diabetes, recommended lifestyle interventions, dietary effects on blood sugar control.  -Counseled on s/sx of and management of hypoglycemia.  -Next A1c anticipated 11/2024.  Written patient instructions provided. Patient verbalized understanding of treatment plan.  Total time in face to face counseling 30 minutes.    Follow-up:  Pharmacist in 4-6 WEEKS. PCP: 02/05/2024  Herlene Fleeta Morris, PharmD, BCACP, CPP Clinical Pharmacist [facility] [phone]

## 2024-09-22 ENCOUNTER — Other Ambulatory Visit: Payer: Self-pay | Admitting: Nurse Practitioner

## 2024-09-22 DIAGNOSIS — C3490 Malignant neoplasm of unspecified part of unspecified bronchus or lung: Secondary | ICD-10-CM

## 2024-09-22 DIAGNOSIS — G893 Neoplasm related pain (acute) (chronic): Secondary | ICD-10-CM

## 2024-09-22 DIAGNOSIS — Z515 Encounter for palliative care: Secondary | ICD-10-CM

## 2024-09-23 ENCOUNTER — Ambulatory Visit (HOSPITAL_COMMUNITY): Payer: MEDICAID

## 2024-09-23 ENCOUNTER — Inpatient Hospital Stay: Payer: MEDICAID

## 2024-09-23 ENCOUNTER — Other Ambulatory Visit (HOSPITAL_COMMUNITY): Payer: Self-pay

## 2024-09-23 ENCOUNTER — Encounter: Payer: Self-pay | Admitting: Internal Medicine

## 2024-09-23 ENCOUNTER — Other Ambulatory Visit: Payer: Self-pay

## 2024-09-23 MED ORDER — OXYCODONE HCL 10 MG PO TABS
10.0000 mg | ORAL_TABLET | Freq: Four times a day (QID) | ORAL | 0 refills | Status: DC | PRN
  Filled 2024-09-23: qty 60, 15d supply, fill #0

## 2024-09-23 MED ORDER — OXYCODONE HCL ER 10 MG PO T12A
10.0000 mg | EXTENDED_RELEASE_TABLET | Freq: Two times a day (BID) | ORAL | 0 refills | Status: DC
  Filled 2024-09-23: qty 60, 30d supply, fill #0

## 2024-09-25 ENCOUNTER — Ambulatory Visit: Payer: MEDICAID | Admitting: Urology

## 2024-09-30 ENCOUNTER — Inpatient Hospital Stay: Payer: MEDICAID

## 2024-10-02 ENCOUNTER — Ambulatory Visit (HOSPITAL_COMMUNITY)
Admission: RE | Admit: 2024-10-02 | Discharge: 2024-10-02 | Disposition: A | Discharge status: Home / Self Care | Payer: MEDICAID | Source: Ambulatory Visit | Attending: Radiation Oncology | Admitting: Radiation Oncology

## 2024-10-02 DIAGNOSIS — C7931 Secondary malignant neoplasm of brain: Secondary | ICD-10-CM | POA: Insufficient documentation

## 2024-10-02 MED ORDER — GADOBUTROL 1 MMOL/ML IV SOLN
7.0000 mL | Freq: Once | INTRAVENOUS | Status: AC | PRN
  Administered 2024-10-02: 7 mL via INTRAVENOUS

## 2024-10-02 NOTE — Progress Notes (Unsigned)
 [facility] OFFICE PROGRESS NOTE  Samuel Clam, MD [address] 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    Biomarker Findings Microsatellite status - MS-Stable Tumor Mutational Burden - 8 Muts/Mb Genomic Findings For a complete list of the genes assayed, please refer to the Appendix. KEAP1 Q227* CDKN2A/B [id number] G89V PRKCI amplification TERC amplification - equivocal? TP53 D290fs*18 8 Disease relevant genes with no reportable alterations: ALK, BRAF, EGFR, ERBB2, KRAS, MET, RET, ROS1   PDL1 TPS  90%  PRIOR THERAPY: 1) Status post palliative radiotherapy to the L2 lesion under the care of Dr. Patrcia. 2) Status post SRS to solitary brain metastasis under the care of Dr. Patrcia 3) status post L2 laminectomy for decompression of the thecal sac under the care of Dr. Lanis on 02/27/2024.  CURRENT THERAPY:  Palliative systemic therapy with carboplatin  for AUC of 5, Alimta  500 Mg/M2 and Keytruda  200 Mg IV every 3 weeks. First dose September 05, 2022. Status post 36 cycles. Starting from cycle #5, he started on maintenance keytruda  and alimta  IV every 3 weeks.   INTERVAL HISTORY: Samuel [name] 58 y.o. male returns to the clinic today for a follow up visit.   The patient was last seen in the clinic 3 weeks ago by Dr. Sherrod.  The patient is feeling well.   There have been no changes in his condition since the last visit, with no fevers, chills, or night sweats. His breathing remains unchanged. He has switched from Pepcid  to Prilosec for reflux management, which has been effective. No nausea,  vomiting, diarrhea, or constipation.   He denies rashes or skin change. He has been using refresh tears for the tearing from his Alimta .   He is here for evaluation and repeat blood work before undergoing cycle #37  MEDICAL HISTORY: Past Medical History:  Diagnosis Date   Alcohol  use disorder, severe, dependence (HCC) 12/26/2016   Anxiety    Depression    Diabetes mellitus without complication (HCC)    Metastatic cancer to brain (HCC) 08/05/2022   Metastatic cancer to spine (HCC) 07/19/2022   Pancreatitis unk   Primary lung adenocarcinoma (HCC) 08/05/2022   Suicidal ideation    Type 2 diabetes mellitus with hyperglycemia, without long-term current use of insulin  (HCC) 06/13/2022   Formatting of this note might be different from the original. 06/13/2022 A1C 13.8, FSBG 414 Start empagliflozin  5mg /metformin  1000 daily, levemir 20 qhs. (Samples given) Will apply for medassist for pharmacy    ALLERGIES:  is allergic to penicillins and percocet [oxycodone -acetaminophen ].  MEDICATIONS:  Current Outpatient Medications  Medication Sig Dispense Refill   acetaminophen  (TYLENOL ) 500 MG tablet Take 1,000 mg by mouth every 6 (six) hours as needed for moderate pain (pain score 4-6).     atorvastatin  (LIPITOR) 40 MG tablet Take 1 tablet (40 mg total) by mouth daily. 90 tablet 1   buPROPion  (WELLBUTRIN  XL) 150 MG 24 hr tablet Take 1 tablet (150 mg total) by mouth daily. 90 tablet 0   cetirizine  (ZYRTEC ) 10 MG tablet Take 1 tablet (10 mg total) by mouth daily. 30 tablet 1   Continuous Glucose Receiver (FREESTYLE LIBRE 3 READER) DEVI Use  to check blood sugar continuously throughout the day. 1 each 0   Continuous Glucose Sensor (FREESTYLE LIBRE 3 PLUS SENSOR) MISC Change sensor every 15 days. Use to check blood sugar continuously. 2 each 6   empagliflozin  (JARDIANCE ) 25 MG TABS tablet Take 1 tablet (25 mg total) by mouth daily before breakfast. 90 tablet 1   escitalopram  (LEXAPRO ) 10 MG tablet Take 1  tablet (10 mg total) by mouth daily. 90 tablet 0   fluticasone  (FLONASE ) 50 MCG/ACT nasal spray Place 2 sprays into both nostrils daily. 48 g 1   folic acid  (FOLVITE ) 1 MG tablet Take 1 tablet (1 mg total) by mouth daily. 30 tablet 4   gabapentin  (NEURONTIN ) 300 MG capsule Take 2 capsules (600 mg total) by mouth 3 (three) times daily. 180 capsule 1   glucose blood (ACCU-CHEK GUIDE TEST) test strip Use as instructed twice daily 100 each 12   glucose blood (ACCU-CHEK GUIDE) test strip Use to check blood sugar 3 times daily. E11.69 100 each 6   insulin  glargine (LANTUS  SOLOSTAR) 100 UNIT/ML Solostar Pen Inject 30 Units into the skin daily. 30 mL 6   Insulin  Pen Needle 32G X 4 MM MISC Use with Insulin  pen 100 each 3   methocarbamol  (ROBAXIN ) 500 MG tablet Take 1 tablet (500 mg total) by mouth every 6 (six) hours as needed for muscle spasms. 90 tablet 0   omeprazole  (PRILOSEC) 20 MG capsule Take 1 capsule (20 mg total) by mouth daily. 30 capsule 2   oxyCODONE  (OXYCONTIN ) 10 mg 12 hr tablet Take 1 tablet (10 mg total) by mouth every 12 (twelve) hours. 60 tablet 0   Oxycodone  HCl 10 MG TABS Take 1 tablet (10 mg total) by mouth every 6 (six) hours as needed. 60 tablet 0   sildenafil  (VIAGRA ) 100 MG tablet Take 0.5-1 tablets (50-100 mg total) by mouth daily as needed for erectile dysfunction. 5 tablet 11   No current facility-administered medications for this visit.    SURGICAL HISTORY:  Past Surgical History:  Procedure Laterality Date   APPLICATION OF ROBOTIC ASSISTANCE FOR SPINAL PROCEDURE N/A 02/27/2024   Procedure: APPLICATION OF ROBOTIC ASSISTANCE FOR SPINAL PROCEDURE;  Surgeon: Lanis Pupa, MD;  Location: [facility] OR;  Service: Neurosurgery;  Laterality: N/A;   IR BONE TUMOR(S)RF ABLATION  08/01/2022   IR IMAGING GUIDED PORT INSERTION  09/21/2022   IR KYPHO LUMBAR INC FX REDUCE BONE BX UNI/BIL CANNULATION INC/IMAGING  08/01/2022   LAMINECTOMY WITH POSTERIOR LATERAL ARTHRODESIS LEVEL 4 N/A 02/27/2024    Procedure: Lumbar two LAMINECTOMY, Thoracic twelve-Lumbar four INSTRUMENTED STABILIZATION;  Surgeon: Lanis Pupa, MD;  Location: [facility] OR;  Service: Neurosurgery;  Laterality: N/A;    REVIEW OF SYSTEMS:   Review of Systems  Constitutional: Positive for stable fatigue. Negative for appetite change, chills, fever and unexpected weight change.  HENT: Positive for tearing. Negative for mouth sores, nosebleeds, sore throat and trouble swallowing.   Eyes: Negative for eye problems and icterus.  Respiratory: Negative for cough, hemoptysis, shortness of breath and wheezing.   Cardiovascular: Negative for chest pain and leg swelling.  Gastrointestinal: Negative for abdominal pain, constipation, diarrhea, nausea and vomiting.  Genitourinary: Negative for bladder incontinence, difficulty urinating, dysuria, frequency and hematuria.   Musculoskeletal: Positive for chronic back pain (greatly improved since diagnosed). Negative for gait problem, neck pain and neck stiffness.  Skin: Positive for a few scattered skin lesions.  Neurological: Negative for dizziness, extremity weakness, gait problem, headaches, light-headedness and seizures.  Hematological: Negative for  adenopathy. Does not bruise/bleed easily.  Psychiatric/Behavioral: Negative for confusion, depression and sleep disturbance. The patient is not nervous/anxious.     PHYSICAL EXAMINATION:  Blood pressure 130/84, pulse 82, temperature 97.6 F (36.4 C), temperature source Temporal, resp. rate 17, height 5' 8 (1.727 m), weight 160 lb (72.6 kg), SpO2 100%.  ECOG PERFORMANCE STATUS: 1  Physical Exam  Constitutional: Oriented to person, place, and time and well-developed, well-nourished, and in no distress.  HENT:  Head: Normocephalic and atraumatic.  Mouth/Throat: Oropharynx is clear and moist. No oropharyngeal exudate.  Eyes: Conjunctivae are normal. Right eye exhibits no discharge. Left eye exhibits no discharge. No scleral icterus.   Neck: Normal range of motion. Neck supple.  Cardiovascular: Normal rate, regular rhythm, normal heart sounds and intact distal pulses.   Pulmonary/Chest: Effort normal and breath sounds normal. No respiratory distress. No wheezes. No rales.  Abdominal: Soft. Bowel sounds are normal. Exhibits no distension and no mass. There is no tenderness.  Musculoskeletal: Normal range of motion. Exhibits no edema. He is wearing a back brace.  Lymphadenopathy:    No cervical adenopathy.  Neurological: Alert and oriented to person, place, and time. Exhibits normal muscle tone. Gait normal. Coordination normal.  Skin: Skin is warm and dry. No rash noted. Not diaphoretic. No erythema. No pallor.  Psychiatric: Mood, memory and judgment normal.  Vitals reviewed.  LABORATORY DATA: Lab Results  Component Value Date   WBC 7.2 10/07/2024   HGB 13.6 10/07/2024   HCT 40.5 10/07/2024   MCV 96.7 10/07/2024   PLT 228 10/07/2024      Chemistry      Component Value Date/Time   NA 140 10/07/2024 1424   NA 140 12/24/2020 1144   NA 142 08/02/2014 1649   K 3.9 10/07/2024 1424   K 4.2 08/02/2014 1649   CL 106 10/07/2024 1424   CL 109 (H) 08/02/2014 1649   CO2 27 10/07/2024 1424   CO2 21 08/02/2014 1649   BUN 13 10/07/2024 1424   BUN 7 12/24/2020 1144   BUN 11 08/02/2014 1649   CREATININE 0.66 10/07/2024 1424   CREATININE 0.92 08/02/2014 1649      Component Value Date/Time   CALCIUM  9.2 10/07/2024 1424   CALCIUM  8.6 08/02/2014 1649   ALKPHOS 83 10/07/2024 1424   ALKPHOS 82 08/02/2014 1649   AST 31 10/07/2024 1424   ALT 38 10/07/2024 1424   ALT 132 (H) 08/02/2014 1649   BILITOT 0.3 10/07/2024 1424       RADIOGRAPHIC STUDIES:  MR Brain W Wo Contrast Result Date: 10/02/2024 EXAM: MRI BRAIN WITH AND WITHOUT CONTRAST 10/02/2024 01:04:20 PM TECHNIQUE: Multiplanar multisequence MRI of the head/brain was performed with and without the administration of intravenous contrast. COMPARISON: Head MRI  07/05/2024. CLINICAL HISTORY: Brain metastases, assess treatment response. Non-small cell lung cancer. FINDINGS: BRAIN AND VENTRICLES: There is no evidence of an acute infarct, midline shift, hydrocephalus, or extra axial fluid collection. Cerebral volume is normal. Enhancing brain lesions follows (with references to series 1200): 1. 3 mm lesion in the inferior right cerebellar hemisphere, unchanged (image 95). 2. 8 mm lesion in the posteromedial left temporal lobe, increased in size with increased, mild edema (image 138). 3. 11 mm lesion in the right occipital lobe, increased in size with increased, mild edema (image 140). A small focus of enhancement along the superior surface of the left cerebellar hemisphere across the tentorium from the posterior left temporal metastasis is unchanged and is curvilinear in orientation on reformats (series  1200, image 131 and series 10, image 17). Chronic microhemorrhages are again noted associated with the right cerebellar lesion and in the right occipital white matter. No new enhancing intracranial lesions are identified. Major intracranial vascular flow voids are preserved. ORBITS: No acute abnormality. SINUSES: Mild to moderate mucosal thickening in the paranasal sinuses. Minimal bilateral mastoid fluid. BONES AND SOFT TISSUES: Normal bone marrow signal and enhancement. Occipital scalp lipoma. IMPRESSION: 1. Mildly increased size of the treated right occipital and left temporal lesions with increased, mild edema, indeterminate for progressive post-treatment changes versus recurrent tumor. 2. Unchanged mild enhancement in the superior left cerebellar hemisphere which is again noted to be in close proximity to the treated left temporal metastasis and may be treatment-related. 3. Unchanged treated right cerebellar metastasis. Electronically signed by: Dasie Hamburg MD 10/02/2024 02:17 PM EST RP Workstation: [id number]   CT CHEST ABDOMEN PELVIS W CONTRAST Result Date:  09/11/2024 CLINICAL DATA:  Lung cancer restaging. EXAM: CT CHEST, ABDOMEN, AND PELVIS WITH CONTRAST TECHNIQUE: Multidetector CT imaging of the chest, abdomen and pelvis was performed following the standard protocol during bolus administration of intravenous contrast. RADIATION DOSE REDUCTION: This exam was performed according to the departmental dose-optimization program which includes automated exposure control, adjustment of the mA and/or kV according to patient size and/or use of iterative reconstruction technique. CONTRAST:  OMNIPAQUE  IOHEXOL  300 MG/ML  SOLN COMPARISON:  CT dated 06/19/2024. FINDINGS: CT CHEST FINDINGS Cardiovascular: There is no cardiomegaly or pericardial effusion. Mild atherosclerotic calcification of the thoracic aorta. No aneurysmal dilatation or dissection. The origins of the great vessels of the aortic arch and the central pulmonary arteries are patent. Right-sided Port-A-Cath with tip at the cavoatrial junction. Mediastinum/Nodes: No hilar or mediastinal adenopathy. Mildly thickened esophagus suspicious for esophagitis. Small amount of fluid content noted within the esophagus. No mediastinal fluid collection. Lungs/Pleura: Background of paraseptal emphysema. No focal consolidation, pleural effusion or pneumothorax. The central airways are patent. Musculoskeletal: Old healed bilateral posterior rib fractures. Faint sclerotic changes of T3 similar to prior CT. No acute osseous pathology. No new osseous metastasis. CT ABDOMEN PELVIS FINDINGS No intra-abdominal free air or free fluid. Hepatobiliary: The liver is unremarkable. Mild biliary dilatation. The gallbladder is unremarkable. Pancreas: The pancreas is poorly visualized but grossly unremarkable. Spleen: Normal in size without focal abnormality. Adrenals/Urinary Tract: The adrenal glands unremarkable. Several tiny nonobstructing right renal calculi measure up to 2 mm. No hydronephrosis or shadowing stone. The left kidney,  visualized ureters, and urinary bladder appear unremarkable. Stomach/Bowel: There is apparent infiltration of the fat plane involving the third portion of the duodenum which is poorly visualized due to streak artifact caused by spinal hardware (63/5 and 74/2). Clinical correlation recommended to evaluate for possibility of duodenitis. Alternatively this may be related to infiltration of the retroperitoneal fat plane. This finding is similar to prior CT. There is no bowel obstruction. No evidence of acute appendicitis. Vascular/Lymphatic: Moderate aortoiliac atherosclerotic disease. The IVC is unremarkable. No portal venous gas. No obvious adenopathy. Reproductive: The prostate is grossly unremarkable. Other: None Musculoskeletal: Compression fracture of L2 vertebra with near complete loss of vertebral body height and vertebroplasty. Similar appearance of a 7 mm retropulsed fragment causing focal narrowing of the central canal. There is L2 laminectomy. Sclerotic changes of L3 and L4 as seen on the prior CT. T12-L4 posterior fusion hardware noted. No acute osseous pathology. IMPRESSION: 1. No acute intrathoracic, abdominal, or pelvic pathology. 2. Stable sclerotic changes of T3, L3, and L4. No new osseous metastasis. No  evidence of extraosseous metastasis. 3. Mildly thickened esophagus suspicious for esophagitis. 4. Apparent infiltration of the fat plane involving the third portion of the duodenum. Clinical correlation recommended to evaluate for possibility of duodenitis. Alternatively this may be related to infiltration of the retroperitoneal fat plane. This finding is similar to prior CT. 5. Aortic Atherosclerosis (ICD10-I70.0) and Emphysema (ICD10-J43.9). Electronically Signed   By: Vanetta Chou M.D.   On: 09/11/2024 21:28     ASSESSMENT/PLAN:  This is a very pleasant 58 year old African-American male diagnosed with stage IV (T3, N2, M1 C) non-small cell lung cancer, adenocarcinoma.  He presented with a  right pulmonary nodule in addition to right hilar mediastinal lymphadenopathy and pleural effusion, metastatic disease to the brain, and extensive metastatic bone disease to the lumbar spine.  He underwent biopsy and kyphoplasty to the L2 and to the brain metastasis.  He was diagnosed in September 2023.   His molecular studies showed no actionable mutations and his PD-L1 expression is 90%.  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    He underwent palliative radiation to L2 compression fracture as well as a solitary brain metastasis with SRS.    He is currently being treated with palliative systemic chemotherapy with carboplatin  for an AUC of 5, Alimta  500 mg per metered square, Keytruda  200 mg IV every 3 weeks.  He status post 36 cycles and he tolerated well without any concerning adverse side effects.  Starting from cycle #5 he started maintenance Alimta  and Keytruda .  IV every 3 weeks   After 2 years of keytruda , the patient decided to continue due to his high PDL1 and lack of side effects.    Radiation oncology is monitoring his brain MRI and lumbar spine closely every few months. He has a follow up with Ashlynn later this week on 10/09/24.   Labs were reviewed. Recommend he proceed with cycle #37 today as scheduled.    We will see him back for follow-up visit in 3 weeks for evaluation repeat blood work before undergoing cycle #38.    Chemotherapy-induced epiphora Increased tearing likely due to Alimta  causing spasms in lacrimal duct.  - Recommend using Refresh Tears (artificial tears).   Gastroesophageal reflux disease Prilosec effective for GERD management.   The patient was advised to call immediately if he has any concerning symptoms in the interval. The patient voices understanding of current disease status and treatment options and is in agreement with the current care  plan. All questions were answered. The patient knows to call the clinic with any problems, questions or concerns. We can certainly see the patient much sooner if necessary    No orders of the defined types were placed in this encounter.   The total time spent in the appointment was 20-29 minutes  Rain Friedt L Adler Chartrand, PA-C 10/09/24

## 2024-10-06 ENCOUNTER — Other Ambulatory Visit: Payer: Self-pay | Admitting: Physician Assistant

## 2024-10-06 DIAGNOSIS — C3491 Malignant neoplasm of unspecified part of right bronchus or lung: Secondary | ICD-10-CM

## 2024-10-07 ENCOUNTER — Inpatient Hospital Stay: Payer: MEDICAID | Admitting: Physician Assistant

## 2024-10-07 ENCOUNTER — Inpatient Hospital Stay: Payer: MEDICAID | Attending: Adult Health

## 2024-10-07 ENCOUNTER — Inpatient Hospital Stay: Payer: MEDICAID

## 2024-10-07 ENCOUNTER — Other Ambulatory Visit: Payer: Self-pay | Admitting: Nurse Practitioner

## 2024-10-07 ENCOUNTER — Encounter: Payer: Self-pay | Admitting: Internal Medicine

## 2024-10-07 ENCOUNTER — Other Ambulatory Visit (HOSPITAL_COMMUNITY): Payer: Self-pay

## 2024-10-07 VITALS — BP 130/84 | HR 82 | Temp 97.6°F | Resp 17 | Ht 68.0 in | Wt 160.0 lb

## 2024-10-07 DIAGNOSIS — T451X5A Adverse effect of antineoplastic and immunosuppressive drugs, initial encounter: Secondary | ICD-10-CM | POA: Diagnosis not present

## 2024-10-07 DIAGNOSIS — M4856XA Collapsed vertebra, not elsewhere classified, lumbar region, initial encounter for fracture: Secondary | ICD-10-CM | POA: Insufficient documentation

## 2024-10-07 DIAGNOSIS — C7951 Secondary malignant neoplasm of bone: Secondary | ICD-10-CM | POA: Insufficient documentation

## 2024-10-07 DIAGNOSIS — Z7984 Long term (current) use of oral hypoglycemic drugs: Secondary | ICD-10-CM | POA: Diagnosis not present

## 2024-10-07 DIAGNOSIS — K219 Gastro-esophageal reflux disease without esophagitis: Secondary | ICD-10-CM | POA: Diagnosis not present

## 2024-10-07 DIAGNOSIS — I700 Atherosclerosis of aorta: Secondary | ICD-10-CM | POA: Diagnosis not present

## 2024-10-07 DIAGNOSIS — Z5111 Encounter for antineoplastic chemotherapy: Secondary | ICD-10-CM | POA: Diagnosis not present

## 2024-10-07 DIAGNOSIS — H04209 Unspecified epiphora, unspecified lacrimal gland: Secondary | ICD-10-CM | POA: Insufficient documentation

## 2024-10-07 DIAGNOSIS — C3491 Malignant neoplasm of unspecified part of right bronchus or lung: Secondary | ICD-10-CM | POA: Insufficient documentation

## 2024-10-07 DIAGNOSIS — Z79899 Other long term (current) drug therapy: Secondary | ICD-10-CM | POA: Diagnosis not present

## 2024-10-07 DIAGNOSIS — C3490 Malignant neoplasm of unspecified part of unspecified bronchus or lung: Secondary | ICD-10-CM

## 2024-10-07 DIAGNOSIS — E119 Type 2 diabetes mellitus without complications: Secondary | ICD-10-CM | POA: Diagnosis not present

## 2024-10-07 DIAGNOSIS — J438 Other emphysema: Secondary | ICD-10-CM | POA: Diagnosis not present

## 2024-10-07 DIAGNOSIS — F1020 Alcohol dependence, uncomplicated: Secondary | ICD-10-CM | POA: Diagnosis not present

## 2024-10-07 DIAGNOSIS — Z5112 Encounter for antineoplastic immunotherapy: Secondary | ICD-10-CM | POA: Diagnosis not present

## 2024-10-07 DIAGNOSIS — N200 Calculus of kidney: Secondary | ICD-10-CM | POA: Insufficient documentation

## 2024-10-07 DIAGNOSIS — C7931 Secondary malignant neoplasm of brain: Secondary | ICD-10-CM | POA: Insufficient documentation

## 2024-10-07 LAB — CMP (CANCER CENTER ONLY)
ALT: 38 U/L (ref 0–44)
AST: 31 U/L (ref 15–41)
Albumin: 4 g/dL (ref 3.5–5.0)
Alkaline Phosphatase: 83 U/L (ref 38–126)
Anion gap: 7 (ref 5–15)
BUN: 13 mg/dL (ref 6–20)
CO2: 27 mmol/L (ref 22–32)
Calcium: 9.2 mg/dL (ref 8.9–10.3)
Chloride: 106 mmol/L (ref 98–111)
Creatinine: 0.66 mg/dL (ref 0.61–1.24)
GFR, Estimated: >60 mL/min (ref >60)
Glucose, Bld: 164 mg/dL — ABNORMAL HIGH (ref 70–99)
Potassium: 3.9 mmol/L (ref 3.5–5.1)
Sodium: 140 mmol/L (ref 135–145)
Total Bilirubin: 0.3 mg/dL (ref 0.0–1.2)
Total Protein: 7.7 g/dL (ref 6.5–8.1)

## 2024-10-07 LAB — CBC WITH DIFFERENTIAL (CANCER CENTER ONLY)
Abs Immature Granulocytes: 0.02 K/uL (ref 0.00–0.07)
Basophils Absolute: 0 K/uL (ref 0.0–0.1)
Basophils Relative: 0 %
Eosinophils Absolute: 0.1 K/uL (ref 0.0–0.5)
Eosinophils Relative: 1 %
HCT: 40.5 % (ref 39.0–52.0)
Hemoglobin: 13.6 g/dL (ref 13.0–17.0)
Immature Granulocytes: 0 %
Lymphocytes Relative: 21 %
Lymphs Abs: 1.5 K/uL (ref 0.7–4.0)
MCH: 32.5 pg (ref 26.0–34.0)
MCHC: 33.6 g/dL (ref 30.0–36.0)
MCV: 96.7 fL (ref 80.0–100.0)
Monocytes Absolute: 0.6 K/uL (ref 0.1–1.0)
Monocytes Relative: 8 %
Neutro Abs: 5 K/uL (ref 1.7–7.7)
Neutrophils Relative %: 70 %
Platelet Count: 228 K/uL (ref 150–400)
RBC: 4.19 MIL/uL — ABNORMAL LOW (ref 4.22–5.81)
RDW: 13.3 % (ref 11.5–15.5)
WBC Count: 7.2 K/uL (ref 4.0–10.5)
nRBC: 0 % (ref 0.0–0.2)

## 2024-10-07 LAB — TSH: TSH: 0.8 u[IU]/mL (ref 0.350–4.500)

## 2024-10-07 LAB — T4, FREE: Free T4: 1.16 ng/dL — ABNORMAL HIGH (ref 0.61–1.12)

## 2024-10-07 MED ORDER — SODIUM CHLORIDE 0.9 % IV SOLN: Freq: Once | INTRAVENOUS | Status: AC

## 2024-10-07 MED ORDER — OXYCODONE HCL 10 MG PO TABS
10.0000 mg | ORAL_TABLET | Freq: Four times a day (QID) | ORAL | 0 refills | Status: DC | PRN
  Filled 2024-10-07 – 2024-10-08 (×2): qty 60, 15d supply, fill #0

## 2024-10-07 MED ORDER — SODIUM CHLORIDE 0.9 % IV SOLN
500.0000 mg/m2 | Freq: Once | INTRAVENOUS | Status: AC
  Administered 2024-10-07: 900 mg via INTRAVENOUS
  Filled 2024-10-07: qty 36

## 2024-10-07 MED ORDER — OMEPRAZOLE 20 MG PO CPDR
20.0000 mg | DELAYED_RELEASE_CAPSULE | Freq: Every day | ORAL | 2 refills | Status: AC
  Filled 2024-10-07: qty 30, 30d supply, fill #0
  Filled 2024-12-06: qty 30, 30d supply, fill #1

## 2024-10-07 MED ORDER — PROCHLORPERAZINE MALEATE 10 MG PO TABS
10.0000 mg | ORAL_TABLET | Freq: Once | ORAL | Status: AC
  Administered 2024-10-07: 10 mg via ORAL
  Filled 2024-10-07: qty 1

## 2024-10-07 MED ORDER — SODIUM CHLORIDE 0.9 % IV SOLN
200.0000 mg | Freq: Once | INTRAVENOUS | Status: AC
  Administered 2024-10-07: 200 mg via INTRAVENOUS
  Filled 2024-10-07: qty 200

## 2024-10-07 NOTE — Patient Instructions (Signed)
 [facility] - A DEPT OF [facility]  Discharge Instructions: Thank you for choosing [facility] to provide your oncology and hematology care.   If you have a lab appointment with the Cancer Center, please go directly to the Cancer Center and check in at the registration area.   Wear comfortable clothing and clothing appropriate for easy access to any Portacath or PICC line.   We strive to give you quality time with your provider. You may need to reschedule your appointment if you arrive late (15 or more minutes).  Arriving late affects you and other patients whose appointments are after yours.  Also, if you miss three or more appointments without notifying the office, you may be dismissed from the clinic at the provider's discretion.      For prescription refill requests, have your pharmacy contact our office and allow 72 hours for refills to be completed.    Today you received the following chemotherapy and/or immunotherapy agents: Keytruda, Alimta.       To help prevent nausea and vomiting after your treatment, we encourage you to take your nausea medication as directed.  BELOW ARE SYMPTOMS THAT SHOULD BE REPORTED IMMEDIATELY: *FEVER GREATER THAN 100.4 F (38 C) OR HIGHER *CHILLS OR SWEATING *NAUSEA AND VOMITING THAT IS NOT CONTROLLED WITH YOUR NAUSEA MEDICATION *UNUSUAL SHORTNESS OF BREATH *UNUSUAL BRUISING OR BLEEDING *URINARY PROBLEMS (pain or burning when urinating, or frequent urination) *BOWEL PROBLEMS (unusual diarrhea, constipation, pain near the anus) TENDERNESS IN MOUTH AND THROAT WITH OR WITHOUT PRESENCE OF ULCERS (sore throat, sores in mouth, or a toothache) UNUSUAL RASH, SWELLING OR PAIN  UNUSUAL VAGINAL DISCHARGE OR ITCHING   Items with * indicate a potential emergency and should be followed up as soon as possible or go to the Emergency Department if any problems should occur.  Please show the CHEMOTHERAPY ALERT CARD or  IMMUNOTHERAPY ALERT CARD at check-in to the Emergency Department and triage nurse.  Should you have questions after your visit or need to cancel or reschedule your appointment, please contact [facility] - A DEPT OF [facility]  Dept: [phone]  and follow the prompts.  Office hours are 8:00 a.m. to 4:30 p.m. Monday - Friday. Please note that voicemails left after 4:00 p.m. may not be returned until the following business day.  We are closed weekends and major holidays. You have access to a nurse at all times for urgent questions. Please call the main number to the clinic Dept: [phone] and follow the prompts.   For any non-urgent questions, you may also contact your provider using MyChart. We now offer e-Visits for anyone 61 and older to request care online for non-urgent symptoms. For details visit [email or web address].   Also download the MyChart app! Go to the app store, search "MyChart", open the app, select , and log in with your MyChart username and password.

## 2024-10-07 NOTE — Progress Notes (Signed)
 Follow up call to receive results from brain MRI on 10/02/24  Pain/headaches/vision?  IMPRESSION: 1. Mildly increased size of the treated right occipital and left temporal lesions with increased, mild edema, indeterminate for progressive post-treatment changes versus recurrent tumor. 2. Unchanged mild enhancement in the superior left cerebellar hemisphere which is again noted to be in close proximity to the treated left temporal metastasis and may be treatment-related. 3. Unchanged treated right cerebellar metastasis.

## 2024-10-08 ENCOUNTER — Telehealth (HOSPITAL_COMMUNITY): Payer: Self-pay

## 2024-10-08 ENCOUNTER — Other Ambulatory Visit: Payer: Self-pay

## 2024-10-08 ENCOUNTER — Other Ambulatory Visit (HOSPITAL_COMMUNITY): Payer: Self-pay

## 2024-10-09 ENCOUNTER — Ambulatory Visit
Admission: RE | Admit: 2024-10-09 | Discharge: 2024-10-09 | Disposition: A | Discharge status: Home / Self Care | Payer: MEDICAID | Source: Ambulatory Visit | Attending: Urology | Admitting: Urology

## 2024-10-09 ENCOUNTER — Encounter: Payer: Self-pay | Admitting: Internal Medicine

## 2024-10-09 NOTE — Progress Notes (Incomplete)
 Follow up call to receive results from brain MRI on 10/02/24  Pain/headaches/vision?  IMPRESSION: 1. Mildly increased size of the treated right occipital and left temporal lesions with increased, mild edema, indeterminate for progressive post-treatment changes versus recurrent tumor. 2. Unchanged mild enhancement in the superior left cerebellar hemisphere which is again noted to be in close proximity to the treated left temporal metastasis and may be treatment-related. 3. Unchanged treated right cerebellar metastasis.

## 2024-10-14 ENCOUNTER — Inpatient Hospital Stay: Payer: MEDICAID

## 2024-10-16 ENCOUNTER — Other Ambulatory Visit (HOSPITAL_COMMUNITY): Payer: Self-pay

## 2024-10-16 ENCOUNTER — Ambulatory Visit
Admission: RE | Admit: 2024-10-16 | Discharge: 2024-10-16 | Disposition: A | Discharge status: Home / Self Care | Payer: MEDICAID | Source: Ambulatory Visit | Attending: Urology | Admitting: Urology

## 2024-10-16 DIAGNOSIS — C7931 Secondary malignant neoplasm of brain: Secondary | ICD-10-CM

## 2024-10-16 DIAGNOSIS — C3490 Malignant neoplasm of unspecified part of unspecified bronchus or lung: Secondary | ICD-10-CM

## 2024-10-16 MED ORDER — DEXAMETHASONE 2 MG PO TABS
2.0000 mg | ORAL_TABLET | Freq: Two times a day (BID) | ORAL | 0 refills | Status: AC
Start: 2024-10-16 — End: ?
  Filled 2024-10-16: qty 14, 7d supply, fill #0

## 2024-10-16 NOTE — Progress Notes (Signed)
 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  Interval Since Last Radiation:  2 years 08/17/2022 through 08/30/2022 Site Technique Total Dose (Gy) Dose per Fx (Gy) Completed Fx Beam Energies  Lumbar Spine: Spine 3D 30/30 3 10/10 10X, 15X  Brain: Brain_SRS IMRT 20/20 20 1/1 6XFFF   Narrative:  I spoke with the patient to conduct his routine scheduled 3 month follow up visit to review results of his recent MRI brain scan via telephone to spare the patient unnecessary potential exposure in the healthcare setting.  The patient was notified in advance and gave permission to proceed with this visit format.  He tolerated radiation therapy very well and remains without complaints. He has continued to tolerate the systemic therapy well and completed 4 cycles of the carboplatin /Alimta /Keytruda  before transitioning to maintenance therapy with Alimta /Keytruda  only, starting from cycle 5. He feels like his energy and strength have significantly improved since coming off the carboplatinin and he has been able to gradually increase his activity level which he is pleased with.   MRI of the lumbar and thoracic spine were performed 06/09/23 and showed stability of the treated metastatic disease in the lumbar spine (L2) and an overall unchanged appearance of the metastatic disease in the thoracic and lumbar spine compared to the MRI from 03/01/2023 with no new lesions or epidural soft tissue tumor identified. However, he became more symptomatic with some occasional numbness/tingling in his lower extremities and mild-moderate back pain in the mid to lower back that was becoming less well-controlled with  pain medications. Therefore, we referred him for evaluation with neurosurgery and a repeat lumbar spine MRI from 01/04/24 demonstrated previous near vertebra plana L2 with previous vertebral augmentation. There was a portion of retropulsed bone contributing to moderate stenosis at this level with some slight reversal of the normal lumbar lordosis focused at this level. Dynamic thoracolumbar spine x-rays done in the neurosurgery office demonstrated exaggerated focal kyphosis centered about the L2 fracture, most notable comparing upright extended to flexed x-rays. He subsequently underwent L2 decompression and instrumented stabilization of T12-L4 under the care of Dr. Lanis and Dr. Colon on 02/27/24. He has recovered well from the procedure and reports significant improvement in the back pain and resolution of the paraesthesias in the LEs. He is now able to work out in the gym more consistently which he is very pleased with.  His most recent restaging CT C/A/P from 09/10/24 continues to show an excellent response to treatment with no evidence for recurrence or extraosseous metastatic disease in the thorax, abdomen or pelvis and a stable appearance of the osseous metastatic disease in the thoracolumbar spine. The plan is to continue his current systemic therapy and he has a follow up visit with Dr. Sherrod on 11/18/24.   He had a recent follow up MRI brain scan on 10/02/24 that showed persistent enhancement of small treated right occipital and left temporal lesions, that are mildly increased in size and with mild increased edema, most suggestive of radiation necrosis but cannot completely rule out recurrent tumor.  There is an unchanged appearance of the mild enhancement in the superior left cerebellar hemisphere which is in close proximity  to the treated left temporal lesion (both are visible on series 10, image 36), most likely treatment effect.  His imaging was reviewed in the recent multidisciplinary brain  conference on 10/14/2024 and the recommendation is to continue with close monitoring with a repeat MRI brain scan in 2-3 months to re-evaluate and review in the multidisciplinary brain conference.  We will also trial a short course of steroids 1 week prior to his next scan to help us  better differentiate between the suspected radionecrosis versus progressive disease.  We reviewed these results and recommendations by telephone today.  On review of systems, the patient states that he is doing well in general. He reports significant improvement in his back pain and resolution of paraesthesias in the LEs since the time of his decompression procedure in 02/2024. He continues using a back brace for support and has been able to workout at [employer] more consistently to help with stretching, strength and balance.  He has not noticed any change in his bowel or bladder habits.  He does have occasional muscle cramps/tightness in the low back, at the surgical site, particularly in the mornings but these usually resolve spontaneously throughout the day with repositioning, stretching and massage. He has noticed some increased tearing of his eyes, occasional imbalance and some increased fatigue but denies headaches, nausea, vomiting, dizziness, slurred speech, changes in visual or auditory acuity or tremors.  Overall, he feels that he is doing well and is quite pleased with his progress to date.  ALLERGIES:  is allergic to penicillins and percocet [oxycodone -acetaminophen ].  Meds: Current Outpatient Medications  Medication Sig Dispense Refill   acetaminophen  (TYLENOL ) 500 MG tablet Take 1,000 mg by mouth every 6 (six) hours as needed for moderate pain (pain score 4-6).     atorvastatin  (LIPITOR) 40 MG tablet Take 1 tablet (40 mg total) by mouth daily. 90 tablet 1   buPROPion  (WELLBUTRIN  XL) 150 MG 24 hr tablet Take 1 tablet (150 mg total) by mouth daily. 90 tablet 0   cetirizine  (ZYRTEC ) 10 MG tablet Take 1 tablet  (10 mg total) by mouth daily. 30 tablet 1   Continuous Glucose Receiver (FREESTYLE LIBRE 3 READER) DEVI Use to check blood sugar continuously throughout the day. 1 each 0   Continuous Glucose Sensor (FREESTYLE LIBRE 3 PLUS SENSOR) MISC Change sensor every 15 days. Use to check blood sugar continuously. 2 each 6   empagliflozin  (JARDIANCE ) 25 MG TABS tablet Take 1 tablet (25 mg total) by mouth daily before breakfast. 90 tablet 1   escitalopram  (LEXAPRO ) 10 MG tablet Take 1 tablet (10 mg total) by mouth daily. 90 tablet 0   fluticasone  (FLONASE ) 50 MCG/ACT nasal spray Place 2 sprays into both nostrils daily. 48 g 1   folic acid  (FOLVITE ) 1 MG tablet Take 1 tablet (1 mg total) by mouth daily. 30 tablet 4   gabapentin  (NEURONTIN ) 300 MG capsule Take 2 capsules (600 mg total) by mouth 3 (three) times daily. 180 capsule 1   glucose blood (ACCU-CHEK GUIDE TEST) test strip Use as instructed twice daily 100 each 12   glucose blood (ACCU-CHEK GUIDE) test strip Use to check blood sugar 3 times daily. E11.69 100 each 6   insulin  glargine (LANTUS  SOLOSTAR) 100 UNIT/ML Solostar Pen Inject 30 Units into the skin daily. 30 mL 6   Insulin  Pen Needle 32G X 4 MM MISC Use with Insulin  pen 100 each 3   methocarbamol  (ROBAXIN ) 500 MG tablet Take 1 tablet (500 mg total) by  mouth every 6 (six) hours as needed for muscle spasms. 90 tablet 0   omeprazole  (PRILOSEC) 20 MG capsule Take 1 capsule (20 mg total) by mouth daily. 30 capsule 2   oxyCODONE  (OXYCONTIN ) 10 mg 12 hr tablet Take 1 tablet (10 mg total) by mouth every 12 (twelve) hours. 60 tablet 0   Oxycodone  HCl 10 MG TABS Take 1 tablet (10 mg total) by mouth every 6 (six) hours as needed. 60 tablet 0   sildenafil  (VIAGRA ) 100 MG tablet Take 0.5-1 tablets (50-100 mg total) by mouth daily as needed for erectile dysfunction. 5 tablet 11   No current facility-administered medications for this encounter.    Physical Findings:  vitals were not taken for this visit.    /10 Unable to assess due to telephone follow-up visit format.  Lab Findings: Lab Results  Component Value Date   WBC 7.2 10/07/2024   HGB 13.6 10/07/2024   HCT 40.5 10/07/2024   MCV 96.7 10/07/2024   PLT 228 10/07/2024     Radiographic Findings: MR Brain W Wo Contrast Result Date: 10/02/2024 EXAM: MRI BRAIN WITH AND WITHOUT CONTRAST 10/02/2024 01:04:20 PM TECHNIQUE: Multiplanar multisequence MRI of the head/brain was performed with and without the administration of intravenous contrast. COMPARISON: Head MRI 07/05/2024. CLINICAL HISTORY: Brain metastases, assess treatment response. Non-small cell lung cancer. FINDINGS: BRAIN AND VENTRICLES: There is no evidence of an acute infarct, midline shift, hydrocephalus, or extra axial fluid collection. Cerebral volume is normal. Enhancing brain lesions follows (with references to series 1200): 1. 3 mm lesion in the inferior right cerebellar hemisphere, unchanged (image 95). 2. 8 mm lesion in the posteromedial left temporal lobe, increased in size with increased, mild edema (image 138). 3. 11 mm lesion in the right occipital lobe, increased in size with increased, mild edema (image 140). A small focus of enhancement along the superior surface of the left cerebellar hemisphere across the tentorium from the posterior left temporal metastasis is unchanged and is curvilinear in orientation on reformats (series 1200, image 131 and series 10, image 17). Chronic microhemorrhages are again noted associated with the right cerebellar lesion and in the right occipital white matter. No new enhancing intracranial lesions are identified. Major intracranial vascular flow voids are preserved. ORBITS: No acute abnormality. SINUSES: Mild to moderate mucosal thickening in the paranasal sinuses. Minimal bilateral mastoid fluid. BONES AND SOFT TISSUES: Normal bone marrow signal and enhancement. Occipital scalp lipoma. IMPRESSION: 1. Mildly increased size of the treated right  occipital and left temporal lesions with increased, mild edema, indeterminate for progressive post-treatment changes versus recurrent tumor. 2. Unchanged mild enhancement in the superior left cerebellar hemisphere which is again noted to be in close proximity to the treated left temporal metastasis and may be treatment-related. 3. Unchanged treated right cerebellar metastasis. Electronically signed by: Dasie Hamburg MD 10/02/2024 02:17 PM EST RP Workstation: [id number]    Impression/Plan: 1. 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  He has recovered well from the effects of the radiation.  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  Recommendation is for continued close monitoring with a repeat MRI scan in 3 months and review in the multidisciplinary brain and spine conference.  I will send a prescription for 2 mg Decadron  twice daily for 1 week prior to his next scan.  I will plan  to follow-up with him by telephone thereafter to review results and recommendations from the multidisciplinary brain and spine conference. He will also continue in routine follow up with Dr. Sherrod, for continued management of his systemic disease and is scheduled for a follow up visit with him on 11/18/24. He has a good understanding of our recommendations and is comfortable and in agreement with the stated plan.  Regarding the metastatic disease in the spine, he will continue in follow up with neurosurgery and if  there is any clinical indication for additional radiation in the future, we are happy to offer further treatment. We will plan to connect again following his MRI brain scan in 12/2024 but he knows to call with any questions or concerns in the interim.  I personally spent 30 minutes in this encounter including chart review, reviewing  radiological studies, telephone conversation with the patient, entering orders and completing documentation.    Sabra MICAEL Rusk, PA-C

## 2024-10-16 NOTE — Progress Notes (Signed)
 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    Follow up call to receive results from brain MRI on 10/02/24  Patient has no complaints other than some lower back pain (4/10) but he says that is nothing new   IMPRESSION: 1. Mildly increased size of the treated right occipital and left temporal lesions with increased, mild edema, indeterminate for progressive post-treatment changes versus recurrent tumor. 2. Unchanged mild enhancement in the superior left cerebellar hemisphere which is again noted to be in close proximity to the treated left temporal metastasis and may be treatment-related. 3. Unchanged treated right cerebellar metastasis.

## 2024-10-18 ENCOUNTER — Other Ambulatory Visit: Payer: Self-pay | Admitting: Family Medicine

## 2024-10-18 ENCOUNTER — Other Ambulatory Visit (HOSPITAL_COMMUNITY): Payer: Self-pay | Admitting: Physician Assistant

## 2024-10-18 DIAGNOSIS — E1142 Type 2 diabetes mellitus with diabetic polyneuropathy: Secondary | ICD-10-CM

## 2024-10-18 DIAGNOSIS — F4310 Post-traumatic stress disorder, unspecified: Secondary | ICD-10-CM

## 2024-10-18 DIAGNOSIS — J329 Chronic sinusitis, unspecified: Secondary | ICD-10-CM

## 2024-10-18 DIAGNOSIS — F411 Generalized anxiety disorder: Secondary | ICD-10-CM

## 2024-10-18 DIAGNOSIS — F322 Major depressive disorder, single episode, severe without psychotic features: Secondary | ICD-10-CM

## 2024-10-19 ENCOUNTER — Other Ambulatory Visit (HOSPITAL_COMMUNITY): Payer: Self-pay

## 2024-10-19 ENCOUNTER — Encounter: Payer: Self-pay | Admitting: Internal Medicine

## 2024-10-20 ENCOUNTER — Other Ambulatory Visit: Payer: Self-pay

## 2024-10-20 MED ORDER — FREESTYLE LIBRE 3 PLUS SENSOR MISC
6 refills | Status: AC
  Filled 2024-10-20: qty 2, 30d supply, fill #0
  Filled 2024-11-22: qty 2, 30d supply, fill #1
  Filled 2024-12-22: qty 2, 30d supply, fill #2

## 2024-10-20 MED ORDER — ACCU-CHEK GUIDE TEST VI STRP
ORAL_STRIP | 12 refills | Status: AC
  Filled 2024-10-20: qty 100, 50d supply, fill #0
  Filled 2024-11-22: qty 100, 50d supply, fill #1

## 2024-10-20 MED ORDER — CETIRIZINE HCL 10 MG PO TABS
10.0000 mg | ORAL_TABLET | Freq: Every day | ORAL | 1 refills | Status: AC
  Filled 2024-10-20: qty 30, 30d supply, fill #0

## 2024-10-21 ENCOUNTER — Other Ambulatory Visit: Payer: Self-pay

## 2024-10-21 ENCOUNTER — Other Ambulatory Visit (HOSPITAL_COMMUNITY): Payer: Self-pay

## 2024-10-21 ENCOUNTER — Other Ambulatory Visit: Payer: Self-pay | Admitting: Radiation Therapy

## 2024-10-21 DIAGNOSIS — C7931 Secondary malignant neoplasm of brain: Secondary | ICD-10-CM

## 2024-10-22 ENCOUNTER — Other Ambulatory Visit: Payer: Self-pay | Admitting: Nurse Practitioner

## 2024-10-22 ENCOUNTER — Other Ambulatory Visit (HOSPITAL_COMMUNITY): Payer: Self-pay

## 2024-10-22 ENCOUNTER — Other Ambulatory Visit: Payer: Self-pay

## 2024-10-22 DIAGNOSIS — G893 Neoplasm related pain (acute) (chronic): Secondary | ICD-10-CM

## 2024-10-22 DIAGNOSIS — Z515 Encounter for palliative care: Secondary | ICD-10-CM

## 2024-10-22 DIAGNOSIS — C3490 Malignant neoplasm of unspecified part of unspecified bronchus or lung: Secondary | ICD-10-CM

## 2024-10-22 MED ORDER — OXYCODONE HCL ER 10 MG PO T12A
10.0000 mg | EXTENDED_RELEASE_TABLET | Freq: Two times a day (BID) | ORAL | 0 refills | Status: DC
  Filled 2024-10-22: qty 60, 30d supply, fill #0

## 2024-10-22 MED ORDER — OXYCODONE HCL 10 MG PO TABS
10.0000 mg | ORAL_TABLET | Freq: Four times a day (QID) | ORAL | 0 refills | Status: DC | PRN
  Filled 2024-10-22: qty 60, 15d supply, fill #0

## 2024-10-23 ENCOUNTER — Other Ambulatory Visit (HOSPITAL_COMMUNITY): Payer: Self-pay

## 2024-10-27 ENCOUNTER — Other Ambulatory Visit: Payer: Self-pay | Admitting: Nurse Practitioner

## 2024-10-27 DIAGNOSIS — C3490 Malignant neoplasm of unspecified part of unspecified bronchus or lung: Secondary | ICD-10-CM

## 2024-10-27 NOTE — Assessment & Plan Note (Signed)
 stage IV (T3, N2, M1 C) non-small cell lung cancer, adenocarcinoma.  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  He underwent biopsy and kyphoplasty to the L2 and to the brain metastasis.  He was diagnosed in September 2023.   His molecular studies showed no actionable mutations and his PD-L1 expression is 90%.  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    He underwent palliative radiation to L2 compression fracture as well as a solitary brain metastasis with SRS.    He is currently being treated with palliative systemic chemotherapy with carboplatin  for an AUC of 5, Alimta  500 mg per metered square, Keytruda  200 mg IV every 3 weeks.  He status post 36 cycles and he tolerated well without any concerning adverse side effects.  Starting from cycle #5 he started maintenance Alimta  and Keytruda .  IV every 3 weeks     After 2 years of keytruda , the patient decided to continue due to his high PDL1 and lack of side effects.    Radiation oncology is monitoring his brain MRI and lumbar spine closely every few months. He has a follow up with Ashlynn later this week on 10/09/24.   Labs were reviewed. Recommend he proceed with cycle #37 today as scheduled.    We will see him back for follow-up visit in 3 weeks for evaluation repeat blood work before undergoing cycle #38.

## 2024-10-27 NOTE — Progress Notes (Unsigned)
 Patient Care Team: Samuel Clam, MD as PCP - General (Family Medicine) Samuel [name] SAILOR, NP as Nurse Practitioner (Nurse Practitioner)  Clinic Day:  10/28/2024  Referring physician: [name], MD  ASSESSMENT & PLAN:   Assessment & Plan: Primary lung adenocarcinoma (HCC) stage IV (T3, N2, M1 C) non-small cell lung cancer, adenocarcinoma.  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  He underwent biopsy and kyphoplasty to the L2 and to the brain metastasis.  He was diagnosed in September 2023.  His molecular studies showed no actionable mutations and his PD-L1 expression is 90%.  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   He underwent palliative radiation to L2 compression fracture as well as a solitary brain metastasis with SRS.   He is currently being treated with palliative systemic chemotherapy with carboplatin  for an AUC of 5, Alimta  500 mg per metered square, Keytruda  200 mg IV every 3 weeks.  He status post 36 cycles and he tolerated well without any concerning adverse side effects.  Starting from cycle #5 he started maintenance Alimta  and Keytruda .  IV every 3 weeks.   After 2 years of keytruda , the patient decided to continue due to his high PDL1 and lack of side effects.   Radiation oncology is monitoring his brain MRI and lumbar spine closely every few months. He has a follow up with Samuel [name] later this week on 10/09/24.  Labs were reviewed. Recommend he proceed with cycle #38 today as scheduled.   We will see him back for follow-up visit in 3 weeks for evaluation repeat blood work before undergoing cycle #38.    Chemotherapy-induced epiphora Increased tearing likely due to Alimta  causing  spasms in lacrimal duct.  - Recommend using Refresh Tears (artificial tears).   Gastroesophageal reflux disease Patient continues to have intermittent GERD.  Was previously started on Prilosec which she takes as needed.  This continues to be effective.  Balance concerns Patient mentions balance has been problematic over the past few months, gradually getting worse.  Has also noticed increased in headache frequency.  Radiology oncology continues to manage brain MRIs due to prior brain metastases.  Most recent brain MRI was done 10/02/2024.  There was a mild increase in size of the treated right occipital and left temporal lobe lesions with mild edema.  Unclear if this was progressive posttreatment changes or recurrent tumor.  There was a stable enhancement in the superior left cerebellar hemisphere which is in close proximity to the treated located left temporal metastases.  This is felt to be treatment related.  There was also unchanged treated right cerebellar metastases.  Discussed patient's concerns with Cassie, PA.  Advised that patient monitor symptoms.  He should contact radiation oncology if her symptoms have become more frequent or more severe.  Plan - CBC unremarkable. - CMP unremarkable other than blood sugar of 165. - Thyroid  panel pending. Encourage patient to contact radiation oncology for worsening neurological symptoms.  May consider repeat brain MRI sooner than February if symptoms continue to become more frequent or severe. Labs and patient presentation are appropriate for treatment today. Proceed with cycle #38 maintenance Keytruda  and Alimta . Lab/flush, follow-up, and cycle #39 as scheduled.  The patient understands the plans discussed today and is in agreement with  them.  He knows to contact our office if he develops concerns prior to his next appointment.  I provided 25 minutes of face-to-face time during this encounter and > 50% was spent counseling as documented under my  assessment and plan.    Samuel Samuel Lessen, NP  [facility] - A DEPT OF [facility] [address] [place] [address] Dept: [phone] Dept Fax: [phone]   No orders of the defined types were placed in this encounter.     CHIEF COMPLAINT:  CC: Primary adenocarcinoma of the lung   Current Treatment: Palliative systemic therapy with Keytruda  and Alimta  IV every 3 weeks  INTERVAL HISTORY:  Samuel [name] is here today for repeat clinical assessment.  He last saw Samuel [name], Samuel [name], on 10/07/2024.  Since then he has noticed an increase in balance problems and feel feels uncoordinated.  He states he is doing well otherwise.  He denies chest pain, chest pressure, or shortness of breath. He denies headaches or visual disturbances. He denies abdominal pain, nausea, vomiting, or changes in bowel or bladder habits.  He denies fevers or chills. He denies pain. His appetite is good. His weight has increased 10 pounds over last 3 weeks.  I have reviewed the past medical history, past surgical history, social history and family history with the patient and they are unchanged from previous note.  ALLERGIES:  is allergic to penicillins and percocet [oxycodone -acetaminophen ].  MEDICATIONS:  Current Outpatient Medications  Medication Sig Dispense Refill   acetaminophen  (TYLENOL ) 500 MG tablet Take 1,000 mg by mouth every 6 (six) hours as needed for moderate pain (pain score 4-6).     atorvastatin  (LIPITOR) 40 MG tablet Take 1 tablet (40 mg total) by mouth daily. 90 tablet 1   buPROPion  (WELLBUTRIN  XL) 150 MG 24 hr tablet Take 1 tablet (150 mg total) by mouth daily. 90 tablet 0   cetirizine  (ZYRTEC ) 10 MG tablet Take 1 tablet (10 mg total) by mouth daily. 30 tablet 1   Continuous Glucose Receiver (FREESTYLE LIBRE 3 READER) DEVI Use to check blood sugar continuously throughout the day. 1 each 0   Continuous Glucose Sensor (FREESTYLE LIBRE 3 PLUS  SENSOR) MISC Change sensor every 15 days. Use to check blood sugar continuously. 2 each 6   dexamethasone  (DECADRON ) 2 MG tablet Take 1 tablet (2 mg total) by mouth 2 (two) times daily. Begin taking 1 week prior to your next MRI brain scan. 14 tablet 0   empagliflozin  (JARDIANCE ) 25 MG TABS tablet Take 1 tablet (25 mg total) by mouth daily before breakfast. 90 tablet 1   escitalopram  (LEXAPRO ) 10 MG tablet Take 1 tablet (10 mg total) by mouth daily. 90 tablet 0   fluticasone  (FLONASE ) 50 MCG/ACT nasal spray Place 2 sprays into both nostrils daily. 48 g 1   folic acid  (FOLVITE ) 1 MG tablet Take 1 tablet (1 mg total) by mouth daily. 30 tablet 4   gabapentin  (NEURONTIN ) 300 MG capsule Take 2 capsules (600 mg total) by mouth 3 (three) times daily. 180 capsule 1   glucose blood (ACCU-CHEK GUIDE TEST) test strip Use as instructed twice daily 100 each 12   insulin  glargine (LANTUS  SOLOSTAR) 100 UNIT/ML Solostar Pen Inject 30 Units into the skin daily. 30 mL 6   Insulin  Pen Needle 32G X 4 MM MISC Use with Insulin  pen 100 each 3   methocarbamol  (ROBAXIN ) 500 MG tablet Take 1 tablet (500 mg total)  by mouth every 6 (six) hours as needed for muscle spasms. 90 tablet 0   omeprazole  (PRILOSEC) 20 MG capsule Take 1 capsule (20 mg total) by mouth daily. 30 capsule 2   oxyCODONE  (OXYCONTIN ) 10 mg 12 hr tablet Take 1 tablet (10 mg total) by mouth every 12 (twelve) hours. 60 tablet 0   Oxycodone  HCl 10 MG TABS Take 1 tablet (10 mg total) by mouth every 6 (six) hours as needed. 60 tablet 0   sildenafil  (VIAGRA ) 100 MG tablet Take 0.5-1 tablets (50-100 mg total) by mouth daily as needed for erectile dysfunction. 5 tablet 11   No current facility-administered medications for this visit.    HISTORY OF PRESENT ILLNESS:   Oncology History  Primary lung adenocarcinoma (HCC)  08/05/2022 Initial Diagnosis   Primary lung adenocarcinoma (HCC)   08/29/2022 Cancer Staging   Staging form: Lung, AJCC 8th Edition - Clinical:  Stage IVB (cT3, cN2, cM1c) - Signed by Sherrod Sherrod, MD on 08/29/2022   09/05/2022 -  Chemotherapy   Patient is on Treatment Plan : LUNG Carboplatin  (5) + Pemetrexed  (500) + Pembrolizumab  (200) D1 q21d Induction x 4 cycles / Maintenance Pemetrexed  (500) + Pembrolizumab  (200) D1 q21d         REVIEW OF SYSTEMS:   Constitutional: Denies fevers, chills or abnormal weight loss Eyes: Denies blurriness of vision Ears, nose, mouth, throat, and face: Denies mucositis or sore throat Respiratory: Denies cough, dyspnea or wheezes Cardiovascular: Denies palpitation, chest discomfort or lower extremity swelling Gastrointestinal:  Denies nausea, heartburn or change in bowel habits Skin: Denies abnormal skin rashes Lymphatics: Denies new lymphadenopathy or easy bruising Neurological:Denies numbness, tingling or new weaknesses. Feels like balance is becoming more of a concern.  Feels uncoordinated.  Having increased frequency and severity of headaches. Behavioral/Psych: Mood is stable, no new changes  All other systems were reviewed with the patient and are negative.   VITALS:   Today's Vitals   10/28/24 1304  BP: 135/69  Pulse: 89  Resp: 18  Temp: 98 F (36.7 C)  SpO2: 100%  Weight: 170 lb (77.1 kg)   Body mass index is 25.85 kg/m.   Wt Readings from Last 3 Encounters:  10/28/24 170 lb (77.1 kg)  10/28/24 170 lb (77.1 kg)  10/07/24 160 lb (72.6 kg)    Body mass index is 25.85 kg/m.  Performance status (ECOG): 2 - Symptomatic, <50% confined to bed  PHYSICAL EXAM:   GENERAL:alert, no distress and comfortable SKIN: skin color, texture, turgor are normal, no rashes or significant lesions EYES: normal, Conjunctiva are pink and non-injected, sclera clear OROPHARYNX:no exudate, no erythema and lips, buccal mucosa, and tongue normal  NECK: supple, thyroid  normal size, non-tender, without nodularity LYMPH:  no palpable lymphadenopathy in the cervical, axillary or inguinal LUNGS:  clear to auscultation and percussion with normal breathing effort HEART: regular rate & rhythm and no murmurs and no lower extremity edema ABDOMEN:abdomen soft, non-tender and normal bowel sounds Musculoskeletal:no cyanosis of digits and no clubbing  NEURO: alert & oriented x 3 with fluent speech, no focal motor/sensory deficits.    LABORATORY DATA:  I have reviewed the data as listed    Component Value Date/Time   NA 140 10/28/2024 0833   NA 140 12/24/2020 1144   NA 142 08/02/2014 1649   K 4.1 10/28/2024 0833   K 4.2 08/02/2014 1649   CL 106 10/28/2024 0833   CL 109 (H) 08/02/2014 1649   CO2 25 10/28/2024 0833   CO2  21 08/02/2014 1649   GLUCOSE 165 (H) 10/28/2024 0833   GLUCOSE 204 (H) 08/02/2014 1649   BUN 15 10/28/2024 0833   BUN 7 12/24/2020 1144   BUN 11 08/02/2014 1649   CREATININE 0.63 10/28/2024 0833   CREATININE 0.92 08/02/2014 1649   CALCIUM  9.0 10/28/2024 0833   CALCIUM  8.6 08/02/2014 1649   PROT 7.3 10/28/2024 0833   PROT 6.9 12/24/2020 1144   PROT 8.3 (H) 08/02/2014 1649   ALBUMIN  3.9 10/28/2024 0833   ALBUMIN  4.3 12/24/2020 1144   ALBUMIN  3.6 08/02/2014 1649   AST 35 10/28/2024 0833   ALT 35 10/28/2024 0833   ALT 132 (H) 08/02/2014 1649   ALKPHOS 82 10/28/2024 0833   ALKPHOS 82 08/02/2014 1649   BILITOT 0.2 10/28/2024 0833   GFRNONAA >60 10/28/2024 0833   GFRNONAA >60 08/02/2014 1649   GFRAA 130 12/24/2020 1144   GFRAA >60 08/02/2014 1649    Lab Results  Component Value Date   WBC 7.0 10/28/2024   NEUTROABS 4.8 10/28/2024   HGB 12.4 (L) 10/28/2024   HCT 36.9 (L) 10/28/2024   MCV 96.1 10/28/2024   PLT 204 10/28/2024     RADIOGRAPHIC STUDIES: MR Brain W Wo Contrast Result Date: 10/02/2024 EXAM: MRI BRAIN WITH AND WITHOUT CONTRAST 10/02/2024 01:04:20 PM TECHNIQUE: Multiplanar multisequence MRI of the head/brain was performed with and without the administration of intravenous contrast. COMPARISON: Head MRI 07/05/2024. CLINICAL HISTORY: Brain  metastases, assess treatment response. Non-small cell lung cancer. FINDINGS: BRAIN AND VENTRICLES: There is no evidence of an acute infarct, midline shift, hydrocephalus, or extra axial fluid collection. Cerebral volume is normal. Enhancing brain lesions follows (with references to series 1200): 1. 3 mm lesion in the inferior right cerebellar hemisphere, unchanged (image 95). 2. 8 mm lesion in the posteromedial left temporal lobe, increased in size with increased, mild edema (image 138). 3. 11 mm lesion in the right occipital lobe, increased in size with increased, mild edema (image 140). A small focus of enhancement along the superior surface of the left cerebellar hemisphere across the tentorium from the posterior left temporal metastasis is unchanged and is curvilinear in orientation on reformats (series 1200, image 131 and series 10, image 17). Chronic microhemorrhages are again noted associated with the right cerebellar lesion and in the right occipital white matter. No new enhancing intracranial lesions are identified. Major intracranial vascular flow voids are preserved. ORBITS: No acute abnormality. SINUSES: Mild to moderate mucosal thickening in the paranasal sinuses. Minimal bilateral mastoid fluid. BONES AND SOFT TISSUES: Normal bone marrow signal and enhancement. Occipital scalp lipoma. IMPRESSION: 1. Mildly increased size of the treated right occipital and left temporal lesions with increased, mild edema, indeterminate for progressive post-treatment changes versus recurrent tumor. 2. Unchanged mild enhancement in the superior left cerebellar hemisphere which is again noted to be in close proximity to the treated left temporal metastasis and may be treatment-related. 3. Unchanged treated right cerebellar metastasis. Electronically signed by: Dasie Hamburg MD 10/02/2024 02:17 PM EST RP Workstation: [id number]

## 2024-10-28 ENCOUNTER — Inpatient Hospital Stay (HOSPITAL_BASED_OUTPATIENT_CLINIC_OR_DEPARTMENT_OTHER): Payer: MEDICAID | Admitting: Nurse Practitioner

## 2024-10-28 ENCOUNTER — Encounter: Payer: Self-pay | Admitting: Nurse Practitioner

## 2024-10-28 ENCOUNTER — Inpatient Hospital Stay: Payer: MEDICAID | Attending: Adult Health

## 2024-10-28 ENCOUNTER — Inpatient Hospital Stay: Payer: MEDICAID

## 2024-10-28 VITALS — BP 135/69 | HR 89 | Temp 98.0°F | Resp 18 | Wt 170.0 lb

## 2024-10-28 DIAGNOSIS — C3411 Malignant neoplasm of upper lobe, right bronchus or lung: Secondary | ICD-10-CM | POA: Insufficient documentation

## 2024-10-28 DIAGNOSIS — C7951 Secondary malignant neoplasm of bone: Secondary | ICD-10-CM | POA: Insufficient documentation

## 2024-10-28 DIAGNOSIS — Z7952 Long term (current) use of systemic steroids: Secondary | ICD-10-CM | POA: Diagnosis not present

## 2024-10-28 DIAGNOSIS — Z8719 Personal history of other diseases of the digestive system: Secondary | ICD-10-CM | POA: Insufficient documentation

## 2024-10-28 DIAGNOSIS — R6883 Chills (without fever): Secondary | ICD-10-CM | POA: Insufficient documentation

## 2024-10-28 DIAGNOSIS — Z794 Long term (current) use of insulin: Secondary | ICD-10-CM | POA: Diagnosis not present

## 2024-10-28 DIAGNOSIS — Z7984 Long term (current) use of oral hypoglycemic drugs: Secondary | ICD-10-CM | POA: Insufficient documentation

## 2024-10-28 DIAGNOSIS — Z79899 Other long term (current) drug therapy: Secondary | ICD-10-CM | POA: Insufficient documentation

## 2024-10-28 DIAGNOSIS — H04209 Unspecified epiphora, unspecified lacrimal gland: Secondary | ICD-10-CM | POA: Insufficient documentation

## 2024-10-28 DIAGNOSIS — C3490 Malignant neoplasm of unspecified part of unspecified bronchus or lung: Secondary | ICD-10-CM

## 2024-10-28 DIAGNOSIS — E119 Type 2 diabetes mellitus without complications: Secondary | ICD-10-CM | POA: Diagnosis not present

## 2024-10-28 DIAGNOSIS — F1020 Alcohol dependence, uncomplicated: Secondary | ICD-10-CM | POA: Insufficient documentation

## 2024-10-28 DIAGNOSIS — K219 Gastro-esophageal reflux disease without esophagitis: Secondary | ICD-10-CM | POA: Insufficient documentation

## 2024-10-28 DIAGNOSIS — C7931 Secondary malignant neoplasm of brain: Secondary | ICD-10-CM | POA: Insufficient documentation

## 2024-10-28 DIAGNOSIS — T451X5A Adverse effect of antineoplastic and immunosuppressive drugs, initial encounter: Secondary | ICD-10-CM | POA: Insufficient documentation

## 2024-10-28 DIAGNOSIS — Z5112 Encounter for antineoplastic immunotherapy: Secondary | ICD-10-CM | POA: Diagnosis present

## 2024-10-28 LAB — CMP (CANCER CENTER ONLY)
ALT: 35 U/L (ref 0–44)
AST: 35 U/L (ref 15–41)
Albumin: 3.9 g/dL (ref 3.5–5.0)
Alkaline Phosphatase: 82 U/L (ref 38–126)
Anion gap: 8 (ref 5–15)
BUN: 15 mg/dL (ref 6–20)
CO2: 25 mmol/L (ref 22–32)
Calcium: 9 mg/dL (ref 8.9–10.3)
Chloride: 106 mmol/L (ref 98–111)
Creatinine: 0.63 mg/dL (ref 0.61–1.24)
GFR, Estimated: >60 mL/min (ref >60)
Glucose, Bld: 165 mg/dL — ABNORMAL HIGH (ref 70–99)
Potassium: 4.1 mmol/L (ref 3.5–5.1)
Sodium: 140 mmol/L (ref 135–145)
Total Bilirubin: 0.2 mg/dL (ref 0.0–1.2)
Total Protein: 7.3 g/dL (ref 6.5–8.1)

## 2024-10-28 LAB — CBC WITH DIFFERENTIAL (CANCER CENTER ONLY)
Abs Immature Granulocytes: 0.01 K/uL (ref 0.00–0.07)
Basophils Absolute: 0 K/uL (ref 0.0–0.1)
Basophils Relative: 0 %
Eosinophils Absolute: 0.1 K/uL (ref 0.0–0.5)
Eosinophils Relative: 1 %
HCT: 36.9 % — ABNORMAL LOW (ref 39.0–52.0)
Hemoglobin: 12.4 g/dL — ABNORMAL LOW (ref 13.0–17.0)
Immature Granulocytes: 0 %
Lymphocytes Relative: 19 %
Lymphs Abs: 1.4 K/uL (ref 0.7–4.0)
MCH: 32.3 pg (ref 26.0–34.0)
MCHC: 33.6 g/dL (ref 30.0–36.0)
MCV: 96.1 fL (ref 80.0–100.0)
Monocytes Absolute: 0.7 K/uL (ref 0.1–1.0)
Monocytes Relative: 10 %
Neutro Abs: 4.8 K/uL (ref 1.7–7.7)
Neutrophils Relative %: 70 %
Platelet Count: 204 K/uL (ref 150–400)
RBC: 3.84 MIL/uL — ABNORMAL LOW (ref 4.22–5.81)
RDW: 13 % (ref 11.5–15.5)
WBC Count: 7 K/uL (ref 4.0–10.5)
nRBC: 0 % (ref 0.0–0.2)

## 2024-10-28 LAB — TSH: TSH: 1.44 u[IU]/mL (ref 0.350–4.500)

## 2024-10-28 LAB — T4, FREE: Free T4: 0.9 ng/dL (ref 0.61–1.12)

## 2024-10-28 MED ORDER — SODIUM CHLORIDE 0.9 % IV SOLN
500.0000 mg/m2 | Freq: Once | INTRAVENOUS | Status: AC
  Administered 2024-10-28: 900 mg via INTRAVENOUS
  Filled 2024-10-28: qty 20

## 2024-10-28 MED ORDER — PROCHLORPERAZINE MALEATE 10 MG PO TABS
10.0000 mg | ORAL_TABLET | Freq: Once | ORAL | Status: AC
  Administered 2024-10-28: 10 mg via ORAL
  Filled 2024-10-28: qty 1

## 2024-10-28 MED ORDER — SODIUM CHLORIDE 0.9 % IV SOLN: Freq: Once | INTRAVENOUS | Status: AC

## 2024-10-28 MED ORDER — SODIUM CHLORIDE 0.9 % IV SOLN
200.0000 mg | Freq: Once | INTRAVENOUS | Status: AC
  Administered 2024-10-28: 200 mg via INTRAVENOUS
  Filled 2024-10-28: qty 200

## 2024-10-28 NOTE — Patient Instructions (Signed)
 [facility] - A DEPT OF [facility]  Discharge Instructions: Thank you for choosing [facility] to provide your oncology and hematology care.   If you have a lab appointment with the Cancer Center, please go directly to the Cancer Center and check in at the registration area.   Wear comfortable clothing and clothing appropriate for easy access to any Portacath or PICC line.   We strive to give you quality time with your provider. You may need to reschedule your appointment if you arrive late (15 or more minutes).  Arriving late affects you and other patients whose appointments are after yours.  Also, if you miss three or more appointments without notifying the office, you may be dismissed from the clinic at the provider's discretion.      For prescription refill requests, have your pharmacy contact our office and allow 72 hours for refills to be completed.    Today you received the following chemotherapy and/or immunotherapy agents keytruda  and alimta        To help prevent nausea and vomiting after your treatment, we encourage you to take your nausea medication as directed.  BELOW ARE SYMPTOMS THAT SHOULD BE REPORTED IMMEDIATELY: *FEVER GREATER THAN 100.4 F (38 C) OR HIGHER *CHILLS OR SWEATING *NAUSEA AND VOMITING THAT IS NOT CONTROLLED WITH YOUR NAUSEA MEDICATION *UNUSUAL SHORTNESS OF BREATH *UNUSUAL BRUISING OR BLEEDING *URINARY PROBLEMS (pain or burning when urinating, or frequent urination) *BOWEL PROBLEMS (unusual diarrhea, constipation, pain near the anus) TENDERNESS IN MOUTH AND THROAT WITH OR WITHOUT PRESENCE OF ULCERS (sore throat, sores in mouth, or a toothache) UNUSUAL RASH, SWELLING OR PAIN  UNUSUAL VAGINAL DISCHARGE OR ITCHING   Items with * indicate a potential emergency and should be followed up as soon as possible or go to the Emergency Department if any problems should occur.  Please show the CHEMOTHERAPY ALERT CARD or  IMMUNOTHERAPY ALERT CARD at check-in to the Emergency Department and triage nurse.  Should you have questions after your visit or need to cancel or reschedule your appointment, please contact [facility]  Dept: [phone]  and follow the prompts.  Office hours are 8:00 a.m. to 4:30 p.m. Monday - Friday. Please note that voicemails left after 4:00 p.m. may not be returned until the following business day.  We are closed weekends and major holidays. You have access to a nurse at all times for urgent questions. Please call the main number to the clinic Dept: [phone] and follow the prompts.   For any non-urgent questions, you may also contact your provider using MyChart. We now offer e-Visits for anyone 33 and older to request care online for non-urgent symptoms. For details visit [email or web address].   Also download the MyChart app! Go to the app store, search MyChart, open the app, select [place], and log in with your MyChart username and password.

## 2024-11-06 ENCOUNTER — Other Ambulatory Visit (HOSPITAL_COMMUNITY): Payer: Self-pay

## 2024-11-06 ENCOUNTER — Other Ambulatory Visit: Payer: Self-pay | Admitting: Nurse Practitioner

## 2024-11-06 MED ORDER — OXYCODONE HCL 10 MG PO TABS
10.0000 mg | ORAL_TABLET | Freq: Four times a day (QID) | ORAL | 0 refills | Status: DC | PRN
  Filled 2024-11-06: qty 60, 15d supply, fill #0

## 2024-11-07 ENCOUNTER — Other Ambulatory Visit (HOSPITAL_COMMUNITY): Payer: Self-pay

## 2024-11-18 ENCOUNTER — Encounter: Payer: Self-pay | Admitting: Medical Oncology

## 2024-11-18 ENCOUNTER — Inpatient Hospital Stay: Payer: MEDICAID | Admitting: Internal Medicine

## 2024-11-18 ENCOUNTER — Inpatient Hospital Stay: Payer: MEDICAID

## 2024-11-18 ENCOUNTER — Other Ambulatory Visit: Payer: Self-pay | Admitting: Internal Medicine

## 2024-11-18 VITALS — BP 122/70 | HR 89 | Temp 97.6°F | Resp 17 | Ht 68.0 in | Wt 161.0 lb

## 2024-11-18 VITALS — BP 144/78 | HR 81 | Temp 97.9°F | Resp 17

## 2024-11-18 DIAGNOSIS — C3490 Malignant neoplasm of unspecified part of unspecified bronchus or lung: Secondary | ICD-10-CM

## 2024-11-18 DIAGNOSIS — C3491 Malignant neoplasm of unspecified part of right bronchus or lung: Secondary | ICD-10-CM

## 2024-11-18 DIAGNOSIS — Z5112 Encounter for antineoplastic immunotherapy: Secondary | ICD-10-CM | POA: Diagnosis not present

## 2024-11-18 LAB — CBC WITH DIFFERENTIAL (CANCER CENTER ONLY)
Abs Immature Granulocytes: 0.02 K/uL (ref 0.00–0.07)
Basophils Absolute: 0 K/uL (ref 0.0–0.1)
Basophils Relative: 1 %
Eosinophils Absolute: 0.1 K/uL (ref 0.0–0.5)
Eosinophils Relative: 1 %
HCT: 36.4 % — ABNORMAL LOW (ref 39.0–52.0)
Hemoglobin: 11.9 g/dL — ABNORMAL LOW (ref 13.0–17.0)
Immature Granulocytes: 0 %
Lymphocytes Relative: 18 %
Lymphs Abs: 1.1 K/uL (ref 0.7–4.0)
MCH: 31.6 pg (ref 26.0–34.0)
MCHC: 32.7 g/dL (ref 30.0–36.0)
MCV: 96.8 fL (ref 80.0–100.0)
Monocytes Absolute: 0.5 K/uL (ref 0.1–1.0)
Monocytes Relative: 9 %
Neutro Abs: 4.2 K/uL (ref 1.7–7.7)
Neutrophils Relative %: 71 %
Platelet Count: 233 K/uL (ref 150–400)
RBC: 3.76 MIL/uL — ABNORMAL LOW (ref 4.22–5.81)
RDW: 13.5 % (ref 11.5–15.5)
WBC Count: 5.9 K/uL (ref 4.0–10.5)
nRBC: 0 % (ref 0.0–0.2)

## 2024-11-18 LAB — CMP (CANCER CENTER ONLY)
ALT: 20 U/L (ref 0–44)
AST: 22 U/L (ref 15–41)
Albumin: 3.8 g/dL (ref 3.5–5.0)
Alkaline Phosphatase: 80 U/L (ref 38–126)
Anion gap: 8 (ref 5–15)
BUN: 18 mg/dL (ref 6–20)
CO2: 26 mmol/L (ref 22–32)
Calcium: 9.1 mg/dL (ref 8.9–10.3)
Chloride: 107 mmol/L (ref 98–111)
Creatinine: 0.66 mg/dL (ref 0.61–1.24)
GFR, Estimated: >60 mL/min
Glucose, Bld: 241 mg/dL — ABNORMAL HIGH (ref 70–99)
Potassium: 4 mmol/L (ref 3.5–5.1)
Sodium: 141 mmol/L (ref 135–145)
Total Bilirubin: 0.2 mg/dL (ref 0.0–1.2)
Total Protein: 7.2 g/dL (ref 6.5–8.1)

## 2024-11-18 LAB — TSH: TSH: 1.55 u[IU]/mL (ref 0.350–4.500)

## 2024-11-18 LAB — T4, FREE: Free T4: 1.48 ng/dL (ref 0.80–2.00)

## 2024-11-18 MED ORDER — SODIUM CHLORIDE 0.9% FLUSH: 10.0000 mL | INTRAVENOUS | Status: DC | PRN

## 2024-11-18 MED ORDER — OXYCODONE HCL 5 MG PO TABS
5.0000 mg | ORAL_TABLET | Freq: Once | ORAL | Status: AC
  Administered 2024-11-18: 5 mg via ORAL
  Filled 2024-11-18: qty 1

## 2024-11-18 MED ORDER — CYANOCOBALAMIN 1000 MCG/ML IJ SOLN
1000.0000 ug | Freq: Once | INTRAMUSCULAR | Status: AC
  Administered 2024-11-18: 1000 ug via INTRAMUSCULAR
  Filled 2024-11-18: qty 1

## 2024-11-18 MED ORDER — PROCHLORPERAZINE MALEATE 10 MG PO TABS
10.0000 mg | ORAL_TABLET | Freq: Once | ORAL | Status: AC
  Administered 2024-11-18: 10 mg via ORAL
  Filled 2024-11-18: qty 1

## 2024-11-18 MED ORDER — SODIUM CHLORIDE 0.9 % IV SOLN
200.0000 mg | Freq: Once | INTRAVENOUS | Status: AC
  Administered 2024-11-18: 200 mg via INTRAVENOUS
  Filled 2024-11-18: qty 200

## 2024-11-18 MED ORDER — SODIUM CHLORIDE 0.9 % IV SOLN
500.0000 mg/m2 | Freq: Once | INTRAVENOUS | Status: AC
  Administered 2024-11-18: 900 mg via INTRAVENOUS
  Filled 2024-11-18: qty 20

## 2024-11-18 MED ORDER — SODIUM CHLORIDE 0.9 % IV SOLN: Freq: Once | INTRAVENOUS | Status: AC

## 2024-11-18 NOTE — Progress Notes (Signed)
 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  Biomarker Findings Microsatellite status - MS-Stable Tumor Mutational Burden - 8 Muts/Mb Genomic Findings For a complete list of the genes assayed, please refer to the Appendix. KEAP1 Q227* CDKN2A/B p16INK4a G89V PRKCI amplification TERC amplification - equivocal TP53 D244fs*18 8 Disease relevant genes with no reportable alterations: ALK, BRAF, EGFR, ERBB2, KRAS, MET, RET, ROS1  PDL1 TPS  90%  PRIOR THERAPY:  1) Status post palliative radiotherapy to the L2 lesion under the care of Dr. Patrcia. 2) Status post SRS to solitary brain metastasis under the care of Dr. Patrcia. 3) status post L2 laminectomy for decompression of the thecal sac under the care of Dr. Lanis on 02/27/2024.  CURRENT THERAPY: Palliative systemic therapy with carboplatin  for AUC of 5, Alimta  500 Mg/M2 and Keytruda  200 Mg IV every 3 weeks.  First dose September 05, 2022.  Status post 35 cycles.  Starting from cycle #5 the patient will be on maintenance treatment with Alimta  and Keytruda  every 3 weeks.  INTERVAL HISTORY: Samuel [name] 58 y.o. male returns to the clinic today for follow-up visit.Discussed the use of AI scribe software for clinical note transcription with the patient, who gave verbal consent to proceed.  History of Present Illness Samuel [name] is a 58 year old male with stage IV lung adenocarcinoma on maintenance pemetrexed  and  pembrolizumab  who presents for pre-cycle 39 evaluation.  He was initially treated with four cycles of carboplatin , pemetrexed , and pembrolizumab , followed by maintenance pemetrexed  and pembrolizumab  from cycle five onward. He has completed 38 cycles of systemic therapy and remains on palliative intent treatment.  He describes intermittent chills, previously occurring postprandially but now occurring at other times, including a recent episode requiring use of a blanket. He denies chest pain, dyspnea, or other systemic symptoms. He notes probable weight gain over the holidays.  He has a history of type 2 diabetes mellitus. His most recent hemoglobin was 11.9 g/dL. He monitors blood glucose regularly.    MEDICAL HISTORY: Past Medical History:  Diagnosis Date   Alcohol  use disorder, severe, dependence (HCC) 12/26/2016   Anxiety    Depression    Diabetes mellitus without complication (HCC)    Metastatic cancer to brain (HCC) 08/05/2022   Metastatic cancer to spine (HCC) 07/19/2022   Pancreatitis unk   Primary lung adenocarcinoma (HCC) 08/05/2022   Suicidal ideation    Type 2 diabetes mellitus with hyperglycemia, without long-term current use of insulin  (HCC) 06/13/2022   Formatting of this note might be different from the original. 06/13/2022 A1C 13.8, FSBG 414 Start empagliflozin  5mg /metformin  1000 daily, levemir 20 qhs. (Samples given) Will apply for medassist for pharmacy    ALLERGIES:  is allergic to penicillins and percocet [oxycodone -acetaminophen ].  MEDICATIONS:  Current Outpatient Medications  Medication Sig Dispense Refill   acetaminophen  (TYLENOL ) 500 MG tablet Take 1,000 mg by mouth every 6 (six) hours as needed for moderate pain (pain score 4-6).     atorvastatin  (LIPITOR) 40 MG tablet Take 1 tablet (40 mg  total) by mouth daily. 90 tablet 1   buPROPion  (WELLBUTRIN  XL) 150 MG 24 hr tablet Take 1 tablet (150 mg total) by mouth daily. 90 tablet 0   cetirizine  (ZYRTEC ) 10 MG  tablet Take 1 tablet (10 mg total) by mouth daily. 30 tablet 1   Continuous Glucose Receiver (FREESTYLE LIBRE 3 READER) DEVI Use to check blood sugar continuously throughout the day. 1 each 0   Continuous Glucose Sensor (FREESTYLE LIBRE 3 PLUS SENSOR) MISC Change sensor every 15 days. Use to check blood sugar continuously. 2 each 6   dexamethasone  (DECADRON ) 2 MG tablet Take 1 tablet (2 mg total) by mouth 2 (two) times daily. Begin taking 1 week prior to your next MRI brain scan. 14 tablet 0   empagliflozin  (JARDIANCE ) 25 MG TABS tablet Take 1 tablet (25 mg total) by mouth daily before breakfast. 90 tablet 1   escitalopram  (LEXAPRO ) 10 MG tablet Take 1 tablet (10 mg total) by mouth daily. 90 tablet 0   fluticasone  (FLONASE ) 50 MCG/ACT nasal spray Place 2 sprays into both nostrils daily. 48 g 1   folic acid  (FOLVITE ) 1 MG tablet Take 1 tablet (1 mg total) by mouth daily. 30 tablet 4   gabapentin  (NEURONTIN ) 300 MG capsule Take 2 capsules (600 mg total) by mouth 3 (three) times daily. 180 capsule 1   glucose blood (ACCU-CHEK GUIDE TEST) test strip Use as instructed twice daily 100 each 12   insulin  glargine (LANTUS  SOLOSTAR) 100 UNIT/ML Solostar Pen Inject 30 Units into the skin daily. 30 mL 6   Insulin  Pen Needle 32G X 4 MM MISC Use with Insulin  pen 100 each 3   methocarbamol  (ROBAXIN ) 500 MG tablet Take 1 tablet (500 mg total) by mouth every 6 (six) hours as needed for muscle spasms. 90 tablet 0   omeprazole  (PRILOSEC) 20 MG capsule Take 1 capsule (20 mg total) by mouth daily. 30 capsule 2   oxyCODONE  (OXYCONTIN ) 10 mg 12 hr tablet Take 1 tablet (10 mg total) by mouth every 12 (twelve) hours. 60 tablet 0   Oxycodone  HCl 10 MG TABS Take 1 tablet (10 mg total) by mouth every 6 (six) hours as needed. 60 tablet 0   sildenafil  (VIAGRA ) 100 MG tablet Take 0.5-1 tablets (50-100 mg total) by mouth daily as needed for erectile dysfunction. 5 tablet 11   No current facility-administered medications for this  visit.    SURGICAL HISTORY:  Past Surgical History:  Procedure Laterality Date   APPLICATION OF ROBOTIC ASSISTANCE FOR SPINAL PROCEDURE N/A 02/27/2024   Procedure: APPLICATION OF ROBOTIC ASSISTANCE FOR SPINAL PROCEDURE;  Surgeon: Lanis Pupa, MD;  Location: [facility] OR;  Service: Neurosurgery;  Laterality: N/A;   IR BONE TUMOR(S)RF ABLATION  08/01/2022   IR IMAGING GUIDED PORT INSERTION  09/21/2022   IR KYPHO LUMBAR INC FX REDUCE BONE BX UNI/BIL CANNULATION INC/IMAGING  08/01/2022   LAMINECTOMY WITH POSTERIOR LATERAL ARTHRODESIS LEVEL 4 N/A 02/27/2024   Procedure: Lumbar two LAMINECTOMY, Thoracic twelve-Lumbar four INSTRUMENTED STABILIZATION;  Surgeon: Lanis Pupa, MD;  Location: [facility] OR;  Service: Neurosurgery;  Laterality: N/A;    REVIEW OF SYSTEMS:  A comprehensive review of systems was negative except for: Constitutional: positive for fatigue   PHYSICAL EXAMINATION: General appearance: alert, cooperative, fatigued, and no distress Head: Normocephalic, without obvious abnormality, atraumatic Neck: no adenopathy, no JVD, supple, symmetrical, trachea midline, and thyroid  not enlarged, symmetric, no tenderness/mass/nodules Lymph nodes: Cervical, supraclavicular, and axillary nodes normal. Resp: clear to auscultation bilaterally Back: symmetric, no  curvature. ROM normal. No CVA tenderness. Cardio: regular rate and rhythm, S1, S2 normal, no murmur, click, rub or gallop GI: soft, non-tender; bowel sounds normal; no masses,  no organomegaly Extremities: extremities normal, atraumatic, no cyanosis or edema  ECOG PERFORMANCE STATUS: 1 - Symptomatic but completely ambulatory  Blood pressure 122/70, pulse 89, temperature 97.6 F (36.4 C), temperature source Temporal, resp. rate 17, height 5' 8 (1.727 m), weight 161 lb (73 kg), SpO2 100%.  LABORATORY DATA: Lab Results  Component Value Date   WBC 7.0 10/28/2024   HGB 12.4 (L) 10/28/2024   HCT 36.9 (L) 10/28/2024   MCV 96.1 10/28/2024    PLT 204 10/28/2024      Chemistry      Component Value Date/Time   NA 140 10/28/2024 0833   NA 140 12/24/2020 1144   NA 142 08/02/2014 1649   K 4.1 10/28/2024 0833   K 4.2 08/02/2014 1649   CL 106 10/28/2024 0833   CL 109 (H) 08/02/2014 1649   CO2 25 10/28/2024 0833   CO2 21 08/02/2014 1649   BUN 15 10/28/2024 0833   BUN 7 12/24/2020 1144   BUN 11 08/02/2014 1649   CREATININE 0.63 10/28/2024 0833   CREATININE 0.92 08/02/2014 1649      Component Value Date/Time   CALCIUM  9.0 10/28/2024 0833   CALCIUM  8.6 08/02/2014 1649   ALKPHOS 82 10/28/2024 0833   ALKPHOS 82 08/02/2014 1649   AST 35 10/28/2024 0833   ALT 35 10/28/2024 0833   ALT 132 (H) 08/02/2014 1649   BILITOT 0.2 10/28/2024 0833       RADIOGRAPHIC STUDIES: No results found.     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 He has molecular studies by foundation 1 that showed no actionable mutations and PD-L1 expression was 90%. He underwent palliative radiotherapy to the L2 vertebral body compression fraction as well as solitary brain metastasis with SRS. He is currently undergoing palliative systemic chemotherapy with carboplatin  for AUC of 5, Alimta  500 Mg/M2 and Keytruda  200 Mg IV every 3 weeks.  Status post 38 cycles. Starting from cycle #5 he is on treatment with maintenance Alimta  and Keytruda  every 3 weeks.  He has been tolerating this treatment well with no concerning adverse effects. Assessment and Plan Assessment & Plan Stage IV lung adenocarcinoma Metastatic lung adenocarcinoma diagnosed  September 2023, negative for actionable mutations, high PD-L1 expression. He has completed 38 cycles of systemic therapy (carboplatin , pemetrexed , pembrolizumab  initially, then maintenance pemetrexed  and pembrolizumab ). He remains clinically stable with intermittent chills and no new symptoms. Blood counts remain stable. He is being evaluated prior to cycle 39 of maintenance therapy. - Proceed with cycle 39 of maintenance pemetrexed  and pembrolizumab . - Order repeat imaging next month to assess disease status. - Review imaging results at next visit. - Follow up in three weeks.  Anemia Mild, stable anemia with hemoglobin 11.9 g/dL. No intervention required currently. - Monitor hemoglobin and complete blood counts regularly.  Type 2 diabetes mellitus Type 2 diabetes mellitus. Intermittent chills; advised to monitor blood glucose during these episodes. He is knowledgeable regarding self-monitoring and uses a glucose meter. -  Instructed to check blood glucose during episodes of chills. Patient was advised to call immediately if he has any concerning symptoms in the interval.   The patient voices understanding of current disease status and treatment options and is in agreement with the current care plan.  All questions were answered. The patient knows to call the clinic with any problems, questions or concerns. We can certainly see the patient much sooner if necessary.  The total time spent in the appointment was 20 minutes.  Disclaimer: This note was dictated with voice recognition software. Similar sounding words can inadvertently be transcribed and may not be corrected upon review.        "

## 2024-11-18 NOTE — Patient Instructions (Signed)
 [facility] - A DEPT OF [facility]  Discharge Instructions: Thank you for choosing [facility] to provide your oncology and hematology care.   If you have a lab appointment with the Cancer Center, please go directly to the Cancer Center and check in at the registration area.   Wear comfortable clothing and clothing appropriate for easy access to any Portacath or PICC line.   We strive to give you quality time with your provider. You may need to reschedule your appointment if you arrive late (15 or more minutes).  Arriving late affects you and other patients whose appointments are after yours.  Also, if you miss three or more appointments without notifying the office, you may be dismissed from the clinic at the provider's discretion.      For prescription refill requests, have your pharmacy contact our office and allow 72 hours for refills to be completed.    Today you received the following chemotherapy and/or immunotherapy agents keytruda  and alimta        To help prevent nausea and vomiting after your treatment, we encourage you to take your nausea medication as directed.  BELOW ARE SYMPTOMS THAT SHOULD BE REPORTED IMMEDIATELY: *FEVER GREATER THAN 100.4 F (38 C) OR HIGHER *CHILLS OR SWEATING *NAUSEA AND VOMITING THAT IS NOT CONTROLLED WITH YOUR NAUSEA MEDICATION *UNUSUAL SHORTNESS OF BREATH *UNUSUAL BRUISING OR BLEEDING *URINARY PROBLEMS (pain or burning when urinating, or frequent urination) *BOWEL PROBLEMS (unusual diarrhea, constipation, pain near the anus) TENDERNESS IN MOUTH AND THROAT WITH OR WITHOUT PRESENCE OF ULCERS (sore throat, sores in mouth, or a toothache) UNUSUAL RASH, SWELLING OR PAIN  UNUSUAL VAGINAL DISCHARGE OR ITCHING   Items with * indicate a potential emergency and should be followed up as soon as possible or go to the Emergency Department if any problems should occur.  Please show the CHEMOTHERAPY ALERT CARD or  IMMUNOTHERAPY ALERT CARD at check-in to the Emergency Department and triage nurse.  Should you have questions after your visit or need to cancel or reschedule your appointment, please contact [facility]  Dept: [phone]  and follow the prompts.  Office hours are 8:00 a.m. to 4:30 p.m. Monday - Friday. Please note that voicemails left after 4:00 p.m. may not be returned until the following business day.  We are closed weekends and major holidays. You have access to a nurse at all times for urgent questions. Please call the main number to the clinic Dept: [phone] and follow the prompts.   For any non-urgent questions, you may also contact your provider using MyChart. We now offer e-Visits for anyone 33 and older to request care online for non-urgent symptoms. For details visit [email or web address].   Also download the MyChart app! Go to the app store, search MyChart, open the app, select [place], and log in with your MyChart username and password.

## 2024-11-22 ENCOUNTER — Other Ambulatory Visit: Payer: Self-pay | Admitting: Nurse Practitioner

## 2024-11-22 ENCOUNTER — Other Ambulatory Visit: Payer: Self-pay

## 2024-11-22 ENCOUNTER — Other Ambulatory Visit (HOSPITAL_COMMUNITY): Payer: Self-pay

## 2024-11-22 DIAGNOSIS — Z515 Encounter for palliative care: Secondary | ICD-10-CM

## 2024-11-22 DIAGNOSIS — C3490 Malignant neoplasm of unspecified part of unspecified bronchus or lung: Secondary | ICD-10-CM

## 2024-11-22 DIAGNOSIS — G893 Neoplasm related pain (acute) (chronic): Secondary | ICD-10-CM

## 2024-11-22 MED ORDER — OXYCODONE HCL ER 10 MG PO T12A
10.0000 mg | EXTENDED_RELEASE_TABLET | Freq: Two times a day (BID) | ORAL | 0 refills | Status: DC
  Filled 2024-11-22: qty 60, 30d supply, fill #0

## 2024-11-22 MED ORDER — OXYCODONE HCL 10 MG PO TABS
10.0000 mg | ORAL_TABLET | Freq: Four times a day (QID) | ORAL | 0 refills | Status: DC | PRN
  Filled 2024-11-22: qty 60, 15d supply, fill #0

## 2024-11-25 ENCOUNTER — Ambulatory Visit: Payer: MEDICAID | Attending: Family Medicine | Admitting: Pharmacist

## 2024-11-25 ENCOUNTER — Encounter: Payer: Self-pay | Admitting: Pharmacist

## 2024-11-25 ENCOUNTER — Other Ambulatory Visit (HOSPITAL_COMMUNITY): Payer: Self-pay

## 2024-11-25 ENCOUNTER — Encounter: Payer: Self-pay | Admitting: Internal Medicine

## 2024-11-25 DIAGNOSIS — Z794 Long term (current) use of insulin: Secondary | ICD-10-CM | POA: Diagnosis not present

## 2024-11-25 DIAGNOSIS — E1142 Type 2 diabetes mellitus with diabetic polyneuropathy: Secondary | ICD-10-CM

## 2024-11-25 DIAGNOSIS — Z7984 Long term (current) use of oral hypoglycemic drugs: Secondary | ICD-10-CM | POA: Diagnosis not present

## 2024-11-25 LAB — POCT GLYCOSYLATED HEMOGLOBIN (HGB A1C): HbA1c, POC (controlled diabetic range): 8 % — AB (ref 0.0–7.0)

## 2024-11-25 MED ORDER — METFORMIN HCL ER 500 MG PO TB24
500.0000 mg | ORAL_TABLET | Freq: Every day | ORAL | 1 refills | Status: AC
  Filled 2024-11-25 – 2024-12-06 (×2): qty 90, 90d supply, fill #0

## 2024-11-25 NOTE — Progress Notes (Signed)
 "   S:     No chief complaint on file.  59 y.o. male who presents for diabetes evaluation, education, and management. PMH is significant for stage IV NSCLC (currently undergoing palliative systemic chemotherapy + immunotherapy w/ Dr. Sherrod), T2DM w/ hx of microalbuminuria & retinopathy, HLD, GAD/MDD, GERD.   Patient was last seen by Primary Care Provider, Dr. Delbert, on 08/06/24. I last saw him on 09/20/2024. His A1c at that visit was 8.3, up from 7.6% three months prior. I adjusted his insulin .   Today, he brings in his CGM for review and his sugars at home have increased slightly since his last visit. CGM report listed below. Of note, I discussed his diabetes hx with him in more detail today. Specifically, he tells me he used to take metformin . Was unable to tolerate 1000 mg BID of regular release metformin  but has never tried XR metformin  at the lower dose.    Current diabetes medications include: Jardiance  25mg  daily, Lantus  30 units daily (still taking 26 units) Patient reports adherence to taking all medications as prescribed.   Insurance coverage: Medicaid  Patient denies hypoglycemia.   Patient denies nocturia (nighttime urination).  Patient denies neuropathy (nerve pain). Actually endorses improvement since his last visit with me.  Patient denies visual changes.  Patient reports self foot exams.   Patient reported dietary habits: Eats 3-4 meals/day -Appetite has improved since his last visit with me. He is able to eat more. -Admits lately that his diet has slipped a little with the holidays.   Patient-reported exercise habits: -None at this time  O:  Date of Download: 11/25/2024 Average Glucose:  -30-day avg: 191 mg/dL Glucose Management Indicator: n/a Time in Goal (30 days):  - Time in range 70-180: 42% - Time above range: 57% - Time below range: 1% Observed patterns: -Spikes and higher avg occurs between 12p-12a. -His lowest avgs are between 6a an 12p but even  then, this avg over last 30 days is 169 mg/dL -Hypoglycemia has resolved since his last visit with me.  Lab Results  Component Value Date   HGBA1C 8.0 (A) 11/25/2024   There were no vitals filed for this visit.  Lipid Panel     Component Value Date/Time   CHOL 133 07/12/2023 1116   TRIG 64 07/12/2023 1116   HDL 41 07/12/2023 1116   CHOLHDL 3.2 07/12/2023 1116   CHOLHDL 2.4 03/11/2021 1843   VLDL 23 03/11/2021 1843   LDLCALC 79 07/12/2023 1116    Clinical Atherosclerotic Cardiovascular Disease (ASCVD): Yes  The ASCVD Risk score (Arnett DK, et al., 2019) failed to calculate for the following reasons:   Risk score cannot be calculated because patient has a medical history suggesting prior/existing ASCVD   * - Cholesterol units were assumed   A/P: Diabetes longstanding currently above goal. Glycemic control has worsened since last visit. With that being said, he acknowledges dietary indiscretion and physical inactivity. He plans to increase his exercise in the new year. We will increase his Lantus  from 26 to 30 units daily and start metformin  500 mg XR daily. -INCREASE Lantus  to 30 units daily in the morning.  -START metformin  500 mg XR daily.  -Continued SGLT2-I Jardiance  (empagliflozin ) 10 mg. Counseled on sick day rules. -Extensively discussed pathophysiology of diabetes, recommended lifestyle interventions, dietary effects on blood sugar control.  -Counseled on s/sx of and management of hypoglycemia.  -Next A1c anticipated 02/2025.  Written patient instructions provided. Patient verbalized understanding of treatment plan.  Total time  in face to face counseling 30 minutes.    Follow-up:  Pharmacist in in 4-6 weeks. PCP: 02/04/2025  Herlene Fleeta Morris, PharmD, BCACP, CPP Clinical Pharmacist [facility] [phone] "

## 2024-12-01 ENCOUNTER — Ambulatory Visit (HOSPITAL_COMMUNITY): Payer: MEDICAID

## 2024-12-01 ENCOUNTER — Ambulatory Visit (HOSPITAL_BASED_OUTPATIENT_CLINIC_OR_DEPARTMENT_OTHER)
Admission: RE | Admit: 2024-12-01 | Discharge: 2024-12-01 | Payer: MEDICAID | Attending: Internal Medicine | Admitting: Internal Medicine

## 2024-12-01 DIAGNOSIS — C3490 Malignant neoplasm of unspecified part of unspecified bronchus or lung: Secondary | ICD-10-CM | POA: Diagnosis present

## 2024-12-01 LAB — POCT I-STAT CREATININE: Creatinine, Ser: 0.8 mg/dL (ref 0.61–1.24)

## 2024-12-01 MED ORDER — IOHEXOL 300 MG/ML  SOLN
100.0000 mL | Freq: Once | INTRAMUSCULAR | Status: AC | PRN
  Administered 2024-12-01: 100 mL via INTRAVENOUS

## 2024-12-02 ENCOUNTER — Other Ambulatory Visit: Payer: Self-pay | Admitting: Internal Medicine

## 2024-12-02 DIAGNOSIS — C3490 Malignant neoplasm of unspecified part of unspecified bronchus or lung: Secondary | ICD-10-CM

## 2024-12-05 ENCOUNTER — Other Ambulatory Visit (HOSPITAL_COMMUNITY): Payer: Self-pay

## 2024-12-06 ENCOUNTER — Encounter: Payer: Self-pay | Admitting: Internal Medicine

## 2024-12-06 ENCOUNTER — Other Ambulatory Visit (HOSPITAL_COMMUNITY): Payer: Self-pay

## 2024-12-06 ENCOUNTER — Other Ambulatory Visit: Payer: Self-pay

## 2024-12-06 ENCOUNTER — Other Ambulatory Visit: Payer: Self-pay | Admitting: Nurse Practitioner

## 2024-12-07 ENCOUNTER — Other Ambulatory Visit (HOSPITAL_COMMUNITY): Payer: Self-pay

## 2024-12-07 MED ORDER — OXYCODONE HCL 10 MG PO TABS
10.0000 mg | ORAL_TABLET | Freq: Four times a day (QID) | ORAL | 0 refills | Status: DC | PRN
  Filled 2024-12-07: qty 60, 15d supply, fill #0

## 2024-12-09 ENCOUNTER — Inpatient Hospital Stay: Payer: MEDICAID | Attending: Adult Health

## 2024-12-09 ENCOUNTER — Encounter: Payer: Self-pay | Admitting: Nurse Practitioner

## 2024-12-09 ENCOUNTER — Inpatient Hospital Stay: Payer: MEDICAID | Admitting: Nurse Practitioner

## 2024-12-09 ENCOUNTER — Inpatient Hospital Stay: Payer: MEDICAID

## 2024-12-09 ENCOUNTER — Other Ambulatory Visit (HOSPITAL_COMMUNITY): Payer: Self-pay

## 2024-12-09 ENCOUNTER — Inpatient Hospital Stay: Payer: MEDICAID | Admitting: Internal Medicine

## 2024-12-09 VITALS — BP 131/73 | HR 82 | Resp 18

## 2024-12-09 VITALS — BP 110/60 | HR 80 | Temp 97.7°F | Resp 17 | Ht 68.0 in | Wt 164.0 lb

## 2024-12-09 DIAGNOSIS — C3491 Malignant neoplasm of unspecified part of right bronchus or lung: Secondary | ICD-10-CM

## 2024-12-09 DIAGNOSIS — Z79899 Other long term (current) drug therapy: Secondary | ICD-10-CM | POA: Diagnosis not present

## 2024-12-09 DIAGNOSIS — M792 Neuralgia and neuritis, unspecified: Secondary | ICD-10-CM

## 2024-12-09 DIAGNOSIS — C7931 Secondary malignant neoplasm of brain: Secondary | ICD-10-CM | POA: Diagnosis not present

## 2024-12-09 DIAGNOSIS — N3289 Other specified disorders of bladder: Secondary | ICD-10-CM | POA: Insufficient documentation

## 2024-12-09 DIAGNOSIS — C3401 Malignant neoplasm of right main bronchus: Secondary | ICD-10-CM | POA: Insufficient documentation

## 2024-12-09 DIAGNOSIS — C3490 Malignant neoplasm of unspecified part of unspecified bronchus or lung: Secondary | ICD-10-CM

## 2024-12-09 DIAGNOSIS — C7951 Secondary malignant neoplasm of bone: Secondary | ICD-10-CM

## 2024-12-09 DIAGNOSIS — F1020 Alcohol dependence, uncomplicated: Secondary | ICD-10-CM | POA: Diagnosis not present

## 2024-12-09 DIAGNOSIS — I700 Atherosclerosis of aorta: Secondary | ICD-10-CM | POA: Diagnosis not present

## 2024-12-09 DIAGNOSIS — E119 Type 2 diabetes mellitus without complications: Secondary | ICD-10-CM | POA: Insufficient documentation

## 2024-12-09 DIAGNOSIS — G893 Neoplasm related pain (acute) (chronic): Secondary | ICD-10-CM

## 2024-12-09 DIAGNOSIS — Z515 Encounter for palliative care: Secondary | ICD-10-CM

## 2024-12-09 DIAGNOSIS — Z923 Personal history of irradiation: Secondary | ICD-10-CM | POA: Diagnosis not present

## 2024-12-09 DIAGNOSIS — Z8719 Personal history of other diseases of the digestive system: Secondary | ICD-10-CM | POA: Insufficient documentation

## 2024-12-09 DIAGNOSIS — J438 Other emphysema: Secondary | ICD-10-CM | POA: Insufficient documentation

## 2024-12-09 DIAGNOSIS — Z7984 Long term (current) use of oral hypoglycemic drugs: Secondary | ICD-10-CM | POA: Insufficient documentation

## 2024-12-09 DIAGNOSIS — R530 Neoplastic (malignant) related fatigue: Secondary | ICD-10-CM

## 2024-12-09 DIAGNOSIS — Z5112 Encounter for antineoplastic immunotherapy: Secondary | ICD-10-CM | POA: Insufficient documentation

## 2024-12-09 DIAGNOSIS — M62838 Other muscle spasm: Secondary | ICD-10-CM

## 2024-12-09 LAB — CBC WITH DIFFERENTIAL (CANCER CENTER ONLY)
Abs Immature Granulocytes: 0.03 K/uL (ref 0.00–0.07)
Basophils Absolute: 0 K/uL (ref 0.0–0.1)
Basophils Relative: 0 %
Eosinophils Absolute: 0.1 K/uL (ref 0.0–0.5)
Eosinophils Relative: 1 %
HCT: 36.9 % — ABNORMAL LOW (ref 39.0–52.0)
Hemoglobin: 12.4 g/dL — ABNORMAL LOW (ref 13.0–17.0)
Immature Granulocytes: 0 %
Lymphocytes Relative: 19 %
Lymphs Abs: 1.5 K/uL (ref 0.7–4.0)
MCH: 32.4 pg (ref 26.0–34.0)
MCHC: 33.6 g/dL (ref 30.0–36.0)
MCV: 96.3 fL (ref 80.0–100.0)
Monocytes Absolute: 0.7 K/uL (ref 0.1–1.0)
Monocytes Relative: 10 %
Neutro Abs: 5.2 K/uL (ref 1.7–7.7)
Neutrophils Relative %: 70 %
Platelet Count: 269 K/uL (ref 150–400)
RBC: 3.83 MIL/uL — ABNORMAL LOW (ref 4.22–5.81)
RDW: 13.4 % (ref 11.5–15.5)
WBC Count: 7.5 K/uL (ref 4.0–10.5)
nRBC: 0 % (ref 0.0–0.2)

## 2024-12-09 LAB — CMP (CANCER CENTER ONLY)
ALT: 25 U/L (ref 0–44)
AST: 30 U/L (ref 15–41)
Albumin: 3.9 g/dL (ref 3.5–5.0)
Alkaline Phosphatase: 76 U/L (ref 38–126)
Anion gap: 8 (ref 5–15)
BUN: 12 mg/dL (ref 6–20)
CO2: 28 mmol/L (ref 22–32)
Calcium: 9.4 mg/dL (ref 8.9–10.3)
Chloride: 105 mmol/L (ref 98–111)
Creatinine: 0.65 mg/dL (ref 0.61–1.24)
GFR, Estimated: >60 mL/min
Glucose, Bld: 117 mg/dL — ABNORMAL HIGH (ref 70–99)
Potassium: 3.9 mmol/L (ref 3.5–5.1)
Sodium: 141 mmol/L (ref 135–145)
Total Bilirubin: 0.2 mg/dL (ref 0.0–1.2)
Total Protein: 7.5 g/dL (ref 6.5–8.1)

## 2024-12-09 LAB — TSH: TSH: 2.08 u[IU]/mL (ref 0.350–4.500)

## 2024-12-09 LAB — T4, FREE: Free T4: 1.7 ng/dL (ref 0.80–2.00)

## 2024-12-09 MED ORDER — SODIUM CHLORIDE 0.9 % IV SOLN: Freq: Once | INTRAVENOUS | Status: AC

## 2024-12-09 MED ORDER — SODIUM CHLORIDE 0.9 % IV SOLN
200.0000 mg | Freq: Once | INTRAVENOUS | Status: AC
  Administered 2024-12-09: 200 mg via INTRAVENOUS
  Filled 2024-12-09: qty 200

## 2024-12-09 MED ORDER — PROCHLORPERAZINE MALEATE 10 MG PO TABS
10.0000 mg | ORAL_TABLET | Freq: Once | ORAL | Status: AC
  Administered 2024-12-09: 10 mg via ORAL
  Filled 2024-12-09: qty 1

## 2024-12-09 MED ORDER — SODIUM CHLORIDE 0.9 % IV SOLN
500.0000 mg/m2 | Freq: Once | INTRAVENOUS | Status: AC
  Administered 2024-12-09: 900 mg via INTRAVENOUS
  Filled 2024-12-09: qty 20

## 2024-12-09 NOTE — Patient Instructions (Signed)
 [facility] - A DEPT OF [facility]  Discharge Instructions: Thank you for choosing [facility] to provide your oncology and hematology care.   If you have a lab appointment with the Cancer Center, please go directly to the Cancer Center and check in at the registration area.   Wear comfortable clothing and clothing appropriate for easy access to any Portacath or PICC line.   We strive to give you quality time with your provider. You may need to reschedule your appointment if you arrive late (15 or more minutes).  Arriving late affects you and other patients whose appointments are after yours.  Also, if you miss three or more appointments without notifying the office, you may be dismissed from the clinic at the provider's discretion.      For prescription refill requests, have your pharmacy contact our office and allow 72 hours for refills to be completed.    Today you received the following chemotherapy and/or immunotherapy agents: pembrolizumab and pemetrexed      To help prevent nausea and vomiting after your treatment, we encourage you to take your nausea medication as directed.  BELOW ARE SYMPTOMS THAT SHOULD BE REPORTED IMMEDIATELY: *FEVER GREATER THAN 100.4 F (38 C) OR HIGHER *CHILLS OR SWEATING *NAUSEA AND VOMITING THAT IS NOT CONTROLLED WITH YOUR NAUSEA MEDICATION *UNUSUAL SHORTNESS OF BREATH *UNUSUAL BRUISING OR BLEEDING *URINARY PROBLEMS (pain or burning when urinating, or frequent urination) *BOWEL PROBLEMS (unusual diarrhea, constipation, pain near the anus) TENDERNESS IN MOUTH AND THROAT WITH OR WITHOUT PRESENCE OF ULCERS (sore throat, sores in mouth, or a toothache) UNUSUAL RASH, SWELLING OR PAIN  UNUSUAL VAGINAL DISCHARGE OR ITCHING   Items with * indicate a potential emergency and should be followed up as soon as possible or go to the Emergency Department if any problems should occur.  Please show the CHEMOTHERAPY ALERT  CARD or IMMUNOTHERAPY ALERT CARD at check-in to the Emergency Department and triage nurse.  Should you have questions after your visit or need to cancel or reschedule your appointment, please contact [facility] - A DEPT OF [facility]  Dept: [phone]  and follow the prompts.  Office hours are 8:00 a.m. to 4:30 p.m. Monday - Friday. Please note that voicemails left after 4:00 p.m. may not be returned until the following business day.  We are closed weekends and major holidays. You have access to a nurse at all times for urgent questions. Please call the main number to the clinic Dept: [phone] and follow the prompts.   For any non-urgent questions, you may also contact your provider using MyChart. We now offer e-Visits for anyone 75 and older to request care online for non-urgent symptoms. For details visit [email or web address].   Also download the MyChart app! Go to the app store, search "MyChart", open the app, select [place], and log in with your MyChart username and password.

## 2024-12-09 NOTE — Progress Notes (Signed)
 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  Biomarker Findings Microsatellite status - MS-Stable Tumor Mutational Burden - 8 Muts/Mb Genomic Findings For a complete list of the genes assayed, please refer to the Appendix. KEAP1 Q227* CDKN2A/B [id number] G89V PRKCI amplification TERC amplification - equivocal TP53 D264fs*18 8 Disease relevant genes with no reportable alterations: ALK, BRAF, EGFR, ERBB2, KRAS, MET, RET, ROS1  PDL1 TPS  90%  PRIOR THERAPY:  1) Status post palliative radiotherapy to the L2 lesion under the care of Dr. Patrcia. 2) Status post SRS to solitary brain metastasis under the care of Dr. Patrcia. 3) status post L2 laminectomy for decompression of the thecal sac under the care of Dr. Lanis on 02/27/2024.  CURRENT THERAPY: Palliative systemic therapy with carboplatin  for AUC of 5, Alimta  500 Mg/M2 and Keytruda  200 Mg IV every 3 weeks.  First dose September 05, 2022.  Status post 39 cycles.  Starting from cycle #5 the patient will be on maintenance treatment with Alimta  and Keytruda  every 3 weeks.  INTERVAL HISTORY: Samuel [name] 59 y.o. male returns to the clinic today for follow-up visit.Discussed the use of AI scribe software for clinical note transcription with the patient, who gave verbal consent to proceed.  History of Present Illness Samuel [name] is a 59 year old male with stage IV non-small cell lung adenocarcinoma who presents for evaluation  and restaging prior to cycle 40 of maintenance Alimta  and Keytruda .  He was diagnosed with stage IV non-small cell lung adenocarcinoma (PD-L1 90%, no actionable mutation) in September 2023. Initial therapy included four cycles of carboplatin , pemetrexed , and pembrolizumab  every three weeks, followed by maintenance pemetrexed  and pembrolizumab  every three weeks beginning with cycle five. He has completed 39 cycles of systemic therapy. Recent CT imaging of the chest, abdomen, and pelvis was performed for restaging, and a brain MRI is scheduled to evaluate post-radiation changes.  He denies chest pain, dyspnea, nausea, vomiting, diarrhea, headaches, abdominal discomfort, or other new symptoms. He reports overall well-being and has not experienced significant adverse effects from ongoing therapy.  He is currently taking a short course of corticosteroids the week prior to his scheduled MRI, as recommended by radiation oncology, to address swelling and scar tissue at the prior radiation site. He is not on long-term corticosteroids due to potential interaction with immunotherapy.    MEDICAL HISTORY: Past Medical History:  Diagnosis Date   Alcohol  use disorder, severe, dependence (HCC) 12/26/2016   Anxiety    Depression    Diabetes mellitus without complication (HCC)    Metastatic cancer to brain (HCC) 08/05/2022   Metastatic cancer to spine (HCC) 07/19/2022   Pancreatitis unk   Primary lung adenocarcinoma (HCC) 08/05/2022   Suicidal ideation    Type 2 diabetes mellitus with hyperglycemia, without long-term current use of insulin  (HCC) 06/13/2022   Formatting of this note might be different from the original. 06/13/2022 A1C 13.8, FSBG 414 Start empagliflozin  5mg /metformin  1000 daily, levemir 20 qhs. (Samples given) Will apply for medassist for pharmacy  ALLERGIES:  is allergic to penicillins.  MEDICATIONS:  Current Outpatient Medications  Medication Sig Dispense Refill   acetaminophen   (TYLENOL ) 500 MG tablet Take 1,000 mg by mouth every 6 (six) hours as needed for moderate pain (pain score 4-6).     atorvastatin  (LIPITOR) 40 MG tablet Take 1 tablet (40 mg total) by mouth daily. 90 tablet 1   buPROPion  (WELLBUTRIN  XL) 150 MG 24 hr tablet Take 1 tablet (150 mg total) by mouth daily. 90 tablet 0   cetirizine  (ZYRTEC ) 10 MG tablet Take 1 tablet (10 mg total) by mouth daily. 30 tablet 1   Continuous Glucose Receiver (FREESTYLE LIBRE 3 READER) DEVI Use to check blood sugar continuously throughout the day. 1 each 0   Continuous Glucose Sensor (FREESTYLE LIBRE 3 PLUS SENSOR) MISC Change sensor every 15 days. Use to check blood sugar continuously. 2 each 6   dexamethasone  (DECADRON ) 2 MG tablet Take 1 tablet (2 mg total) by mouth 2 (two) times daily. Begin taking 1 week prior to your next MRI brain scan. 14 tablet 0   empagliflozin  (JARDIANCE ) 25 MG TABS tablet Take 1 tablet (25 mg total) by mouth daily before breakfast. 90 tablet 1   escitalopram  (LEXAPRO ) 10 MG tablet Take 1 tablet (10 mg total) by mouth daily. 90 tablet 0   fluticasone  (FLONASE ) 50 MCG/ACT nasal spray Place 2 sprays into both nostrils daily. 48 g 1   folic acid  (FOLVITE ) 1 MG tablet Take 1 tablet (1 mg total) by mouth daily. 30 tablet 4   gabapentin  (NEURONTIN ) 300 MG capsule Take 2 capsules (600 mg total) by mouth 3 (three) times daily. 180 capsule 1   glucose blood (ACCU-CHEK GUIDE TEST) test strip Use as instructed twice daily 100 each 12   insulin  glargine (LANTUS  SOLOSTAR) 100 UNIT/ML Solostar Pen Inject 30 Units into the skin daily. 30 mL 6   Insulin  Pen Needle 32G X 4 MM MISC Use with Insulin  pen 100 each 3   metFORMIN  (GLUCOPHAGE -XR) 500 MG 24 hr tablet Take 1 tablet (500 mg total) by mouth daily with breakfast. 90 tablet 1   methocarbamol  (ROBAXIN ) 500 MG tablet Take 1 tablet (500 mg total) by mouth every 6 (six) hours as needed for muscle spasms. 90 tablet 0   omeprazole  (PRILOSEC) 20 MG capsule Take 1  capsule (20 mg total) by mouth daily. 30 capsule 2   oxyCODONE  (OXYCONTIN ) 10 mg 12 hr tablet Take 1 tablet (10 mg total) by mouth every 12 (twelve) hours. 60 tablet 0   Oxycodone  HCl 10 MG TABS Take 1 tablet (10 mg total) by mouth every 6 (six) hours as needed. 60 tablet 0   sildenafil  (VIAGRA ) 100 MG tablet Take 0.5-1 tablets (50-100 mg total) by mouth daily as needed for erectile dysfunction. 5 tablet 11   No current facility-administered medications for this visit.    SURGICAL HISTORY:  Past Surgical History:  Procedure Laterality Date   APPLICATION OF ROBOTIC ASSISTANCE FOR SPINAL PROCEDURE N/A 02/27/2024   Procedure: APPLICATION OF ROBOTIC ASSISTANCE FOR SPINAL PROCEDURE;  Surgeon: Lanis Pupa, MD;  Location: [facility] OR;  Service: Neurosurgery;  Laterality: N/A;   IR BONE TUMOR(S)RF ABLATION  08/01/2022   IR IMAGING GUIDED PORT INSERTION  09/21/2022   IR KYPHO LUMBAR INC FX REDUCE BONE BX UNI/BIL CANNULATION INC/IMAGING  08/01/2022   LAMINECTOMY WITH POSTERIOR LATERAL ARTHRODESIS LEVEL 4 N/A 02/27/2024   Procedure: Lumbar two LAMINECTOMY, Thoracic twelve-Lumbar four INSTRUMENTED STABILIZATION;  Surgeon: Lanis Pupa, MD;  Location:  [facility] OR;  Service: Neurosurgery;  Laterality: N/A;    REVIEW OF SYSTEMS:  Constitutional: negative Eyes: negative Ears, nose, mouth, throat, and face: negative Respiratory: negative Cardiovascular: negative Gastrointestinal: negative Genitourinary:negative Integument/breast: negative Hematologic/lymphatic: negative Musculoskeletal:negative Neurological: negative Behavioral/Psych: negative Endocrine: negative Allergic/Immunologic: negative   PHYSICAL EXAMINATION: General appearance: alert, cooperative, fatigued, and no distress Head: Normocephalic, without obvious abnormality, atraumatic Neck: no adenopathy, no JVD, supple, symmetrical, trachea midline, and thyroid  not enlarged, symmetric, no tenderness/mass/nodules Lymph nodes: Cervical,  supraclavicular, and axillary nodes normal. Resp: clear to auscultation bilaterally Back: symmetric, no curvature. ROM normal. No CVA tenderness. Cardio: regular rate and rhythm, S1, S2 normal, no murmur, click, rub or gallop GI: soft, non-tender; bowel sounds normal; no masses,  no organomegaly Extremities: extremities normal, atraumatic, no cyanosis or edema Neurologic: Alert and oriented X 3, normal strength and tone. Normal symmetric reflexes. Normal coordination and gait  ECOG PERFORMANCE STATUS: 1 - Symptomatic but completely ambulatory  Blood pressure 110/60, pulse 80, temperature 97.7 F (36.5 C), temperature source Temporal, resp. rate 17, height 5' 8 (1.727 m), weight 164 lb (74.4 kg), SpO2 99%.  LABORATORY DATA: Lab Results  Component Value Date   WBC 7.5 12/09/2024   HGB 12.4 (L) 12/09/2024   HCT 36.9 (L) 12/09/2024   MCV 96.3 12/09/2024   PLT 269 12/09/2024      Chemistry      Component Value Date/Time   NA 141 11/18/2024 0755   NA 140 12/24/2020 1144   NA 142 08/02/2014 1649   K 4.0 11/18/2024 0755   K 4.2 08/02/2014 1649   CL 107 11/18/2024 0755   CL 109 (H) 08/02/2014 1649   CO2 26 11/18/2024 0755   CO2 21 08/02/2014 1649   BUN 18 11/18/2024 0755   BUN 7 12/24/2020 1144   BUN 11 08/02/2014 1649   CREATININE 0.80 12/01/2024 1004   CREATININE 0.66 11/18/2024 0755   CREATININE 0.92 08/02/2014 1649      Component Value Date/Time   CALCIUM  9.1 11/18/2024 0755   CALCIUM  8.6 08/02/2014 1649   ALKPHOS 80 11/18/2024 0755   ALKPHOS 82 08/02/2014 1649   AST 22 11/18/2024 0755   ALT 20 11/18/2024 0755   ALT 132 (H) 08/02/2014 1649   BILITOT 0.2 11/18/2024 0755       RADIOGRAPHIC STUDIES: CT CHEST ABDOMEN PELVIS W CONTRAST Result Date: 12/03/2024 CLINICAL DATA:  Non-small-cell lung cancer restaging * Tracking Code: BO * EXAM: CT CHEST, ABDOMEN, AND PELVIS WITH CONTRAST TECHNIQUE: Multidetector CT imaging of the chest, abdomen and pelvis was performed  following the standard protocol during bolus administration of intravenous contrast. RADIATION DOSE REDUCTION: This exam was performed according to the departmental dose-optimization program which includes automated exposure control, adjustment of the mA and/or kV according to patient size and/or use of iterative reconstruction technique. CONTRAST:  OMNIPAQUE  IOHEXOL  300 MG/ML  SOLN COMPARISON:  09/10/2024 FINDINGS: CT CHEST FINDINGS Cardiovascular: Right chest port catheter. Aortic atherosclerosis. Normal heart size. No pericardial effusion. Mediastinum/Nodes: No enlarged mediastinal, hilar, or axillary lymph nodes. Thyroid  gland, trachea, and esophagus demonstrate no significant findings. Lungs/Pleura: Mild paraseptal emphysema. No pleural effusion or pneumothorax. Musculoskeletal: No chest wall abnormality. No acute osseous findings. CT ABDOMEN PELVIS FINDINGS Hepatobiliary: No solid liver abnormality is seen. No gallstones, gallbladder wall thickening, or biliary dilatation. Pancreas: Unremarkable. No pancreatic ductal dilatation or surrounding inflammatory changes. Spleen: Normal in size without significant abnormality. Adrenals/Urinary Tract: Adrenal glands are unremarkable. Kidneys are normal, without renal calculi, solid lesion, or  hydronephrosis. Diffuse urinary bladder wall thickening (series 5, image 71). Stomach/Bowel: Stomach is within normal limits. Appendix appears normal. No evidence of bowel wall thickening, distention, or inflammatory changes. Vascular/Lymphatic: Aortic atherosclerosis. No enlarged abdominal or pelvic lymph nodes. Reproductive: No mass or other abnormality. Other: No abdominal wall hernia or abnormality. No ascites. Musculoskeletal: No acute osseous findings. Unchanged bridging fusion T12 through L4 across a high-grade vertebral plana deformity of L2, with unchanged retropulsion of fracture fragments and prior cement augmentation (series 5, image 69). Unchanged mixed lytic  and sclerotic lesions L3 and L4 (series 5, image 71) IMPRESSION: 1. No evidence of lymphadenopathy or soft tissue metastatic disease in the chest, abdomen, or pelvis. 2. Unchanged mixed lytic and sclerotic lesions of L3 and L4. 3. Unchanged bridging fusion T12 through L4 across a high-grade vertebral plana deformity of L2, with unchanged retropulsion of fracture fragments and prior cement augmentation. 4. Diffuse urinary bladder wall thickening, suggestive of nonspecific infectious or inflammatory cystitis or chronic outlet obstruction. Correlate with urinalysis. 5. Emphysema. Aortic Atherosclerosis (ICD10-I70.0) and Emphysema (ICD10-J43.9). Electronically Signed   By: Marolyn JONETTA Jaksch M.D.   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 He has molecular studies by foundation 1 that showed no actionable mutations and PD-L1 expression was 90%. He underwent palliative radiotherapy to the L2 vertebral body compression fraction as well as solitary brain metastasis with SRS. He is currently undergoing palliative systemic chemotherapy with carboplatin  for AUC of 5, Alimta  500 Mg/M2 and Keytruda  200 Mg IV every 3 weeks.  Status post 38 cycles. Starting from cycle #5 he is on treatment with maintenance Alimta  and Keytruda  every 3 weeks.  He has been tolerating this treatment well with no concerning adverse effects. He had repeat CT scan of the chest, abdomen and pelvis performed recently.  I personally independently reviewed the scan and discussed the result with the patient and  his girlfriend today.  His scan showed no concerning findings for disease progression. Assessment and Plan Assessment & Plan Stage IV non-small cell lung adenocarcinoma Metastatic adenocarcinoma with high PD-L1 expression, currently well-managed on maintenance therapy. He remains asymptomatic and tolerates treatment without significant adverse effects. Recent CT imaging of the chest, abdomen, and pelvis demonstrated stable disease. Scheduled MRI brain for further evaluation of prior radiation changes.  He is followed by radiation oncology for management of brain findings.  Antineoplastic chemotherapy and immunotherapy administration He is receiving maintenance pemetrexed  and pembrolizumab  following prior induction with carboplatin . He tolerates therapy with minimal side effects and remains clinically stable. Radiation oncology prescribed a short course of steroids for perilesional edema prior to MRI, with explicit counseling regarding the risks of prolonged steroid use due to potential interference with immunotherapy efficacy. Long-term steroids are to be avoided while on immunotherapy. - Discussed with the patient discontinuation of treatment since he completed more than 2 years or if he developed any adverse effects.  He would like to continue treatment as planned. - Administered cycle 40 of pemetrexed  (Alimta ) and pembrolizumab  (Keytruda ). - Prescribed short course of steroids prior to MRI, with counseling to avoid prolonged steroid  use while on immunotherapy. - Scheduled follow-up in three weeks for next cycle. He was advised to call immediately if he has any concerning symptoms in the interval. He was advised to call immediately if he has any other concerning symptoms in the interval.  The patient voices understanding of current disease status and treatment options and is in agreement with the current care plan.  All questions were answered. The patient knows to call the clinic with any  problems, questions or concerns. We can certainly see the patient much sooner if necessary. The total time spent in the appointment was 30 minutes including review of chart and various tests results, discussions about plan of care and coordination of care plan .   Disclaimer: This note was dictated with voice recognition software. Similar sounding words can inadvertently be transcribed and may not be corrected upon review.        "

## 2024-12-09 NOTE — Progress Notes (Signed)
 "    Palliative Medicine [facility]  Telephone:(336) [phone] Fax:(336) [phone]   Name: Samuel [name] Date: 12/09/2024 MRN: [id number]  DOB: October 03, 1966  Patient Care Team: [name], MD as PCP - General (Family Medicine) Samuel [name], Samuel SAILOR, NP as Nurse Practitioner (Nurse Practitioner)    INTERVAL HISTORY: Samuel [name] is a 59 y.o. 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  Palliative ask to see for symptom management and goals of care.   SOCIAL HISTORY:     reports that he has been smoking cigarettes. He started smoking about 2 years ago. He has a 1 pack-year smoking history. He has never used smokeless tobacco. He reports current alcohol  use of about 2.0 standard drinks of alcohol  per week. He reports that he does not use drugs.  ADVANCE DIRECTIVES:  Patient does not have an advanced directives. Education and packet provided. He has expressed interest in completing. States his sister, Samuel [name] would be his designated clinical research associate.   CODE STATUS: Full code  PAST MEDICAL HISTORY: Past Medical History:  Diagnosis Date   Alcohol  use disorder, severe, dependence (HCC) 12/26/2016   Anxiety    Depression    Diabetes mellitus without complication (HCC)    Metastatic cancer to brain (HCC) 08/05/2022   Metastatic cancer to spine (HCC) 07/19/2022   Pancreatitis unk   Primary lung adenocarcinoma (HCC) 08/05/2022   Suicidal ideation    Type 2 diabetes mellitus with hyperglycemia, without long-term current use of insulin  (HCC) 06/13/2022   Formatting of this note might be different from the original. 06/13/2022 A1C 13.8, FSBG 414 Start empagliflozin  5mg /metformin  1000 daily, levemir 20 qhs. (Samples given) Will apply for medassist for pharmacy    ALLERGIES:  is allergic to penicillins.  MEDICATIONS:   Current Outpatient Medications  Medication Sig Dispense Refill   acetaminophen  (TYLENOL ) 500 MG tablet Take 1,000 mg by mouth every 6 (six) hours as needed for moderate pain (pain score 4-6).     atorvastatin  (LIPITOR) 40 MG tablet Take 1 tablet (40 mg total) by mouth daily. 90 tablet 1   buPROPion  (WELLBUTRIN  XL) 150 MG 24 hr tablet Take 1 tablet (150 mg total) by mouth daily. 90 tablet 0   cetirizine  (ZYRTEC ) 10 MG tablet Take 1 tablet (10 mg total) by mouth daily. 30 tablet 1   Continuous Glucose Receiver (FREESTYLE LIBRE 3 READER) DEVI Use to check blood sugar continuously throughout the day. 1 each 0   Continuous Glucose Sensor (FREESTYLE LIBRE 3 PLUS SENSOR) MISC Change sensor every 15 days. Use to check blood sugar continuously. 2 each 6   dexamethasone  (DECADRON ) 2 MG tablet Take 1 tablet (2 mg total) by mouth 2 (two) times daily. Begin taking 1 week prior to your next MRI brain scan. 14 tablet 0   empagliflozin  (JARDIANCE ) 25 MG TABS tablet Take 1 tablet (25 mg total) by mouth daily before breakfast. 90 tablet 1   escitalopram  (LEXAPRO ) 10 MG tablet Take 1 tablet (10 mg total) by mouth daily. 90 tablet 0   fluticasone  (FLONASE ) 50 MCG/ACT nasal spray Place 2 sprays into both nostrils daily. 48 g 1   folic acid  (FOLVITE ) 1 MG tablet Take 1 tablet (1 mg total) by mouth daily. 30 tablet 4   gabapentin  (NEURONTIN ) 300 MG capsule Take 2 capsules (600 mg total) by mouth 3 (three) times daily. 180 capsule 1  glucose blood (ACCU-CHEK GUIDE TEST) test strip Use as instructed twice daily 100 each 12   insulin  glargine (LANTUS  SOLOSTAR) 100 UNIT/ML Solostar Pen Inject 30 Units into the skin daily. 30 mL 6   Insulin  Pen Needle 32G X 4 MM MISC Use with Insulin  pen 100 each 3   metFORMIN  (GLUCOPHAGE -XR) 500 MG 24 hr tablet Take 1 tablet (500 mg total) by mouth daily with breakfast. 90 tablet 1   methocarbamol  (ROBAXIN ) 500 MG tablet Take 1 tablet (500 mg total) by mouth every 6 (six) hours as needed  for muscle spasms. 90 tablet 0   omeprazole  (PRILOSEC) 20 MG capsule Take 1 capsule (20 mg total) by mouth daily. 30 capsule 2   oxyCODONE  (OXYCONTIN ) 10 mg 12 hr tablet Take 1 tablet (10 mg total) by mouth every 12 (twelve) hours. 60 tablet 0   Oxycodone  HCl 10 MG TABS Take 1 tablet (10 mg total) by mouth every 6 (six) hours as needed. 60 tablet 0   sildenafil  (VIAGRA ) 100 MG tablet Take 0.5-1 tablets (50-100 mg total) by mouth daily as needed for erectile dysfunction. 5 tablet 11   No current facility-administered medications for this visit.   Facility-Administered Medications Ordered in Other Visits  Medication Dose Route Frequency Provider Last Rate Last Admin   pembrolizumab  (KEYTRUDA ) 200 mg in sodium chloride  0.9 % 50 mL chemo infusion  200 mg Intravenous Once Mohamed, Mohamed, MD 116 mL/hr at 12/09/24 0923 200 mg at 12/09/24 9076   PEMEtrexed  Disodium (ALIMTA ) 900 mg in sodium chloride  0.9 % 100 mL chemo infusion  500 mg/m2 (Order-Specific) Intravenous Once Mohamed, Mohamed, MD       VITAL SIGNS: There were no vitals taken for this visit. There were no vitals filed for this visit.   Estimated body mass index is 24.94 kg/m as calculated from the following:   Height as of an earlier encounter on 12/09/24: 5' 8 (1.727 m).   Weight as of an earlier encounter on 12/09/24: 164 lb (74.4 kg).   PERFORMANCE STATUS (ECOG) : 1 - Symptomatic but completely ambulatory  Physical Exam General: NAD Cardiovascular: regular rate and rhythm Pulmonary: normal breathing pattern  Extremities: no edema, no joint deformities Skin: no rashes Neurological: AAO x3  IMPRESSION: Discussed the use of AI scribe software for clinical note transcription with the patient, who gave verbal consent to proceed.  History of Present Illness Samuel [name] is a 59 year old male with stage IV non-small cell lung cancer who was seen during his infusion. He is accompanied by his Significant Other. No acute  distress noted. Shares he had a good holiday. They went to the beach for New Years Eve and able to see a magic show. He is much appreciative of his current quality of life.   No concerns for uncontrolled nausea, vomiting, constipation, or diarrhea. His appetite is good. Weight stable at 164lbs. Occasional fatigue. He acknowledges a desire to be more active but notes an improvement in activity levels compared to before.  We discussed his pain at length. Jamol reports his pain is currently well controlled. His current regimen includes Oxycontin  10mg  every 12 hours and Oxycodone  10mg  every 6 hours as needed. Does not require use around the clock. No adjustments to current regimen at this time. He is taking medication appropriately.   All questions answered and support provided.   Assessment & Plan Cancer related pain He reports improved pain control and increased activity levels, although not at the desired level.  No adjustments to regimen.  -Oxycontin  10mg  every 12 hours -Oxycodone  10mg  every 6 hours as needed.   Appetite  Much improved. Weight is stable at 164lbs.   Health Maintenance  Patient reports feeling well overall.   Follow-Up I will plan to see patient back in 4-6 weeks. Sooner if needed.   Patient expressed understanding and was in agreement with this plan. He also understands that He can call the clinic at any time with any questions, concerns, or complaints.   Any controlled substances utilized were prescribed in the context of palliative care. PDMP has been reviewed.   I personally spent a total of 30 minutes in the care of the patient today including preparing to see the patient, getting/reviewing separately obtained history, performing a medically appropriate exam/evaluation, counseling and educating, placing orders, and documenting clinical information in the EHR. Visit consisted of counseling and education dealing with the complex and emotionally intense issues of symptom  management and palliative care in the setting of serious and potentially life-threatening illness.  Levon Borer, AGPCNP-BC  Palliative Medicine Team/[facility]    "

## 2024-12-13 ENCOUNTER — Encounter: Payer: Self-pay | Admitting: Internal Medicine

## 2024-12-18 ENCOUNTER — Other Ambulatory Visit: Payer: Self-pay | Admitting: Radiation Therapy

## 2024-12-22 ENCOUNTER — Other Ambulatory Visit: Payer: Self-pay | Admitting: Nurse Practitioner

## 2024-12-22 DIAGNOSIS — Z515 Encounter for palliative care: Secondary | ICD-10-CM

## 2024-12-22 DIAGNOSIS — G893 Neoplasm related pain (acute) (chronic): Secondary | ICD-10-CM

## 2024-12-22 DIAGNOSIS — C3490 Malignant neoplasm of unspecified part of unspecified bronchus or lung: Secondary | ICD-10-CM

## 2024-12-23 ENCOUNTER — Encounter: Payer: Self-pay | Admitting: Internal Medicine

## 2024-12-23 ENCOUNTER — Other Ambulatory Visit (HOSPITAL_COMMUNITY): Payer: Self-pay

## 2024-12-23 MED ORDER — OXYCODONE HCL ER 10 MG PO T12A
10.0000 mg | EXTENDED_RELEASE_TABLET | Freq: Two times a day (BID) | ORAL | 0 refills | Status: AC
  Filled 2024-12-23: qty 60, 30d supply, fill #0

## 2024-12-23 MED ORDER — OXYCODONE HCL 10 MG PO TABS
10.0000 mg | ORAL_TABLET | Freq: Four times a day (QID) | ORAL | 0 refills | Status: AC | PRN
  Filled 2024-12-23: qty 60, 15d supply, fill #0

## 2024-12-25 NOTE — Progress Notes (Unsigned)
 [facility] OFFICE PROGRESS NOTE  Samuel Clam, MD 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    Biomarker Findings Microsatellite status - MS-Stable Tumor Mutational Burden - 8 Muts/Mb Genomic Findings For a complete list of the genes assayed, please refer to the Appendix. KEAP1 Q227* CDKN2A/B p16INK4a G89V PRKCI amplification TERC amplification - equivocal TP53 D243fs*18 8 Disease relevant genes with no reportable alterations: ALK, BRAF, EGFR, ERBB2, KRAS, MET, RET, ROS1   PDL1 TPS  90%  PRIOR THERAPY:  1) Status post palliative radiotherapy to the L2 lesion under the care of Dr. Patrcia. 2) Status post SRS to solitary brain metastasis under the care of Dr. Patrcia 3) status post L2 laminectomy for decompression of the thecal sac under the care of Dr. Lanis on 02/27/2024.  CURRENT THERAPY: Palliative systemic therapy with carboplatin  for AUC of 5, Alimta  500 Mg/M2 and Keytruda  200 Mg IV every 3 weeks. First dose September 05, 2022. Status post 40 cycles. Starting from cycle #5, he started on maintenance keytruda  and alimta  IV every 3 weeks.   INTERVAL HISTORY: Samuel [name] 59 y.o. male returns to the clinic today for a follow up visit.    The patient was last seen in the clinic 3 weeks ago by Dr. Sherrod.  The patient is feeling well.   There have been no changes in his condition since the last visit, with no fevers, chills, or night sweats. His breathing remains unchanged. He has switched from Pepcid  to Prilosec for reflux management, which has been effective. No nausea,  vomiting, diarrhea, or constipation.   He denies rashes or skin change. He has been using refresh tears for the tearing from his Alimta .    He is here for evaluation and repeat blood work before undergoing cycle #41    MEDICAL HISTORY: Past Medical History:  Diagnosis Date   Alcohol  use disorder, severe, dependence (HCC) 12/26/2016   Anxiety    Depression    Diabetes mellitus without complication (HCC)    Metastatic cancer to brain (HCC) 08/05/2022   Metastatic cancer to spine (HCC) 07/19/2022   Pancreatitis unk   Primary lung adenocarcinoma (HCC) 08/05/2022   Suicidal ideation    Type 2 diabetes mellitus with hyperglycemia, without long-term current use of insulin  (HCC) 06/13/2022   Formatting of this note might be different from the original. 06/13/2022 A1C 13.8, FSBG 414 Start empagliflozin  5mg /metformin  1000 daily, levemir 20 qhs. (Samples given) Will apply for medassist for pharmacy    ALLERGIES:  is allergic to penicillins.  MEDICATIONS:  Current Outpatient Medications  Medication Sig Dispense Refill   acetaminophen  (TYLENOL ) 500 MG tablet Take 1,000 mg by mouth every 6 (six) hours as needed for moderate pain (pain score 4-6).     atorvastatin  (LIPITOR) 40 MG tablet Take 1 tablet (40 mg total) by mouth daily. 90 tablet 1   buPROPion  (WELLBUTRIN  XL) 150 MG 24 hr tablet Take 1 tablet (150 mg total) by mouth daily. 90 tablet 0   cetirizine  (ZYRTEC ) 10 MG tablet Take 1 tablet (10 mg total) by mouth daily. 30 tablet 1   Continuous Glucose Receiver (FREESTYLE LIBRE 3 READER) DEVI  Use to check blood sugar continuously throughout the day. 1 each 0   Continuous Glucose Sensor (FREESTYLE LIBRE 3 PLUS SENSOR) MISC Change sensor every 15 days. Use to check blood sugar continuously. 2 each 6   dexamethasone  (DECADRON ) 2 MG tablet Take 1 tablet (2 mg total) by mouth 2 (two) times daily. Begin taking 1 week prior to your next MRI brain scan. 14 tablet 0   empagliflozin  (JARDIANCE ) 25 MG  TABS tablet Take 1 tablet (25 mg total) by mouth daily before breakfast. 90 tablet 1   escitalopram  (LEXAPRO ) 10 MG tablet Take 1 tablet (10 mg total) by mouth daily. 90 tablet 0   fluticasone  (FLONASE ) 50 MCG/ACT nasal spray Place 2 sprays into both nostrils daily. 48 g 1   folic acid  (FOLVITE ) 1 MG tablet Take 1 tablet (1 mg total) by mouth daily. 30 tablet 4   gabapentin  (NEURONTIN ) 300 MG capsule Take 2 capsules (600 mg total) by mouth 3 (three) times daily. 180 capsule 1   glucose blood (ACCU-CHEK GUIDE TEST) test strip Use as instructed twice daily 100 each 12   insulin  glargine (LANTUS  SOLOSTAR) 100 UNIT/ML Solostar Pen Inject 30 Units into the skin daily. 30 mL 6   Insulin  Pen Needle 32G X 4 MM MISC Use with Insulin  pen 100 each 3   metFORMIN  (GLUCOPHAGE -XR) 500 MG 24 hr tablet Take 1 tablet (500 mg total) by mouth daily with breakfast. 90 tablet 1   methocarbamol  (ROBAXIN ) 500 MG tablet Take 1 tablet (500 mg total) by mouth every 6 (six) hours as needed for muscle spasms. 90 tablet 0   omeprazole  (PRILOSEC) 20 MG capsule Take 1 capsule (20 mg total) by mouth daily. 30 capsule 2   oxyCODONE  (OXYCONTIN ) 10 mg 12 hr tablet Take 1 tablet (10 mg total) by mouth every 12 (twelve) hours. 60 tablet 0   Oxycodone  HCl 10 MG TABS Take 1 tablet (10 mg total) by mouth every 6 (six) hours as needed. 60 tablet 0   sildenafil  (VIAGRA ) 100 MG tablet Take 0.5-1 tablets (50-100 mg total) by mouth daily as needed for erectile dysfunction. 5 tablet 11   No current facility-administered medications for this visit.    SURGICAL HISTORY:  Past Surgical History:  Procedure Laterality Date   APPLICATION OF ROBOTIC ASSISTANCE FOR SPINAL PROCEDURE N/A 02/27/2024   Procedure: APPLICATION OF ROBOTIC ASSISTANCE FOR SPINAL PROCEDURE;  Surgeon: Lanis Pupa, MD;  Location: [facility] OR;  Service: Neurosurgery;  Laterality: N/A;   IR BONE TUMOR(S)RF ABLATION  08/01/2022   IR IMAGING GUIDED PORT INSERTION  09/21/2022   IR  KYPHO LUMBAR INC FX REDUCE BONE BX UNI/BIL CANNULATION INC/IMAGING  08/01/2022   LAMINECTOMY WITH POSTERIOR LATERAL ARTHRODESIS LEVEL 4 N/A 02/27/2024   Procedure: Lumbar two LAMINECTOMY, Thoracic twelve-Lumbar four INSTRUMENTED STABILIZATION;  Surgeon: Lanis Pupa, MD;  Location: [facility] OR;  Service: Neurosurgery;  Laterality: N/A;    REVIEW OF SYSTEMS:   Review of Systems  Constitutional: Negative for appetite change, chills, fatigue, fever and unexpected weight change.  HENT:   Negative for mouth sores, nosebleeds, sore throat and trouble swallowing.   Eyes: Negative for eye problems and icterus.  Respiratory: Negative for cough, hemoptysis, shortness of breath and wheezing.   Cardiovascular: Negative for chest pain and leg swelling.  Gastrointestinal: Negative for abdominal pain, constipation, diarrhea, nausea and vomiting.  Genitourinary: Negative for bladder incontinence, difficulty urinating, dysuria, frequency and hematuria.   Musculoskeletal: Negative for back pain, gait problem, neck pain and neck stiffness.  Skin: Negative for itching and rash.  Neurological: Negative for dizziness, extremity weakness, gait problem, headaches, light-headedness and seizures.  Hematological: Negative for adenopathy. Does not bruise/bleed easily.  Psychiatric/Behavioral: Negative for confusion, depression and sleep disturbance. The patient is not nervous/anxious.     PHYSICAL EXAMINATION:  There were no vitals taken for this visit.  ECOG PERFORMANCE STATUS: {CHL ONC ECOG [id number]  Physical Exam  Constitutional: Oriented to person, place, and time and well-developed, well-nourished, and in no distress. No distress.  HENT:  Head: Normocephalic and atraumatic.  Mouth/Throat: Oropharynx is clear and moist. No oropharyngeal exudate.  Eyes: Conjunctivae are normal. Right eye exhibits no discharge. Left eye exhibits no discharge. No scleral icterus.  Neck: Normal range of motion. Neck supple.   Cardiovascular: Normal rate, regular rhythm, normal heart sounds and intact distal pulses.   Pulmonary/Chest: Effort normal and breath sounds normal. No respiratory distress. No wheezes. No rales.  Abdominal: Soft. Bowel sounds are normal. Exhibits no distension and no mass. There is no tenderness.  Musculoskeletal: Normal range of motion. Exhibits no edema.  Lymphadenopathy:    No cervical adenopathy.  Neurological: Alert and oriented to person, place, and time. Exhibits normal muscle tone. Gait normal. Coordination normal.  Skin: Skin is warm and dry. No rash noted. Not diaphoretic. No erythema. No pallor.  Psychiatric: Mood, memory and judgment normal.  Vitals reviewed.  LABORATORY DATA: Lab Results  Component Value Date   WBC 7.5 12/09/2024   HGB 12.4 (L) 12/09/2024   HCT 36.9 (L) 12/09/2024   MCV 96.3 12/09/2024   PLT 269 12/09/2024      Chemistry      Component Value Date/Time   NA 141 12/09/2024 0804   NA 140 12/24/2020 1144   NA 142 08/02/2014 1649   K 3.9 12/09/2024 0804   K 4.2 08/02/2014 1649   CL 105 12/09/2024 0804   CL 109 (H) 08/02/2014 1649   CO2 28 12/09/2024 0804   CO2 21 08/02/2014 1649   BUN 12 12/09/2024 0804   BUN 7 12/24/2020 1144   BUN 11 08/02/2014 1649   CREATININE 0.65 12/09/2024 0804   CREATININE 0.92 08/02/2014 1649      Component Value Date/Time   CALCIUM  9.4 12/09/2024 0804   CALCIUM  8.6 08/02/2014 1649   ALKPHOS 76 12/09/2024 0804   ALKPHOS 82 08/02/2014 1649   AST 30 12/09/2024 0804   ALT 25 12/09/2024 0804   ALT 132 (H) 08/02/2014 1649   BILITOT 0.2 12/09/2024 0804       RADIOGRAPHIC STUDIES:  CT CHEST ABDOMEN PELVIS W CONTRAST Result Date: 12/03/2024 CLINICAL DATA:  Non-small-cell lung cancer restaging * Tracking Code: BO * EXAM: CT CHEST, ABDOMEN, AND PELVIS WITH CONTRAST TECHNIQUE: Multidetector CT imaging of the chest, abdomen and pelvis was performed following the standard protocol during bolus administration of  intravenous contrast. RADIATION DOSE REDUCTION: This exam was performed according to the departmental dose-optimization program which includes automated exposure control, adjustment of the mA and/or kV according to patient size and/or use of iterative reconstruction technique. CONTRAST:  OMNIPAQUE  IOHEXOL  300 MG/ML  SOLN COMPARISON:  09/10/2024 FINDINGS: CT CHEST FINDINGS Cardiovascular: Right chest port catheter. Aortic atherosclerosis. Normal heart size. No pericardial effusion. Mediastinum/Nodes: No enlarged mediastinal, hilar, or axillary lymph nodes. Thyroid  gland, trachea, and esophagus demonstrate no significant findings. Lungs/Pleura: Mild paraseptal emphysema. No pleural effusion or pneumothorax. Musculoskeletal: No chest wall abnormality. No acute osseous findings. CT ABDOMEN PELVIS FINDINGS Hepatobiliary: No solid liver abnormality is seen. No  gallstones, gallbladder wall thickening, or biliary dilatation. Pancreas: Unremarkable. No pancreatic ductal dilatation or surrounding inflammatory changes. Spleen: Normal in size without significant abnormality. Adrenals/Urinary Tract: Adrenal glands are unremarkable. Kidneys are normal, without renal calculi, solid lesion, or hydronephrosis. Diffuse urinary bladder wall thickening (series 5, image 71). Stomach/Bowel: Stomach is within normal limits. Appendix appears normal. No evidence of bowel wall thickening, distention, or inflammatory changes. Vascular/Lymphatic: Aortic atherosclerosis. No enlarged abdominal or pelvic lymph nodes. Reproductive: No mass or other abnormality. Other: No abdominal wall hernia or abnormality. No ascites. Musculoskeletal: No acute osseous findings. Unchanged bridging fusion T12 through L4 across a high-grade vertebral plana deformity of L2, with unchanged retropulsion of fracture fragments and prior cement augmentation (series 5, image 69). Unchanged mixed lytic and sclerotic lesions L3 and L4 (series 5, image 71) IMPRESSION:  1. No evidence of lymphadenopathy or soft tissue metastatic disease in the chest, abdomen, or pelvis. 2. Unchanged mixed lytic and sclerotic lesions of L3 and L4. 3. Unchanged bridging fusion T12 through L4 across a high-grade vertebral plana deformity of L2, with unchanged retropulsion of fracture fragments and prior cement augmentation. 4. Diffuse urinary bladder wall thickening, suggestive of nonspecific infectious or inflammatory cystitis or chronic outlet obstruction. Correlate with urinalysis. 5. Emphysema. Aortic Atherosclerosis (ICD10-I70.0) and Emphysema (ICD10-J43.9). Electronically Signed   By: Samuel [name] M.D.   On: 12/03/2024 11:27     ASSESSMENT/PLAN:  This is a very pleasant 59 year old African-American male diagnosed with stage IV (T3, N2, M1 C) non-small cell lung cancer, adenocarcinoma.  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  He underwent biopsy and kyphoplasty to the L2 and to the brain metastasis.  He was diagnosed in September 2023.   His molecular studies showed no actionable mutations and his PD-L1 expression is 90%.  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    He underwent palliative radiation to L2 compression fracture as well as a solitary brain metastasis with SRS.    He is currently being treated with palliative systemic chemotherapy with carboplatin  for an AUC of 5, Alimta  500 mg per metered square, Keytruda  200 mg IV every 3 weeks.  He status post 39 cycles and he tolerated well without any concerning adverse side effects.  Starting from cycle #5 he started maintenance Alimta  and Keytruda .  IV every 3 weeks   After 2 years of keytruda , the patient decided to continue due to his high PDL1  and lack of side effects.    Radiation oncology is monitoring his brain MRI and lumbar spine closely every few months. He has a follow up with Ashlynn later this week on 10/09/24.   Labs were reviewed. Recommend he proceed with cycle #40 today as scheduled.    We will see him back for follow-up visit in 3 weeks for evaluation repeat blood work before undergoing cycle #41.    Chemotherapy-induced epiphora Increased tearing likely due to Alimta  causing spasms in lacrimal duct.  - Recommend using Refresh Tears (artificial tears).   Gastroesophageal reflux disease Prilosec effective for GERD management.   The patient was advised to call immediately if she has any concerning symptoms in the interval. The patient voices understanding of current disease status and treatment options and is in agreement with the current care  plan. All questions were answered. The patient knows to call the clinic with any problems, questions or concerns. We can certainly see the patient much sooner if necessary    No orders of the defined types were placed in this encounter.    I spent {CHL ONC TIME VISIT - DTPQU:[id number]} counseling the patient face to face. The total time spent in the appointment was {CHL ONC TIME VISIT - DTPQU:[id number]}.  Marlee Armenteros L Kizer Nobbe, PA-C 12/25/24

## 2024-12-30 ENCOUNTER — Inpatient Hospital Stay: Payer: MEDICAID | Attending: Adult Health

## 2024-12-30 ENCOUNTER — Inpatient Hospital Stay: Payer: MEDICAID

## 2024-12-30 ENCOUNTER — Inpatient Hospital Stay: Payer: MEDICAID | Admitting: Nurse Practitioner

## 2024-12-30 ENCOUNTER — Inpatient Hospital Stay: Payer: MEDICAID | Admitting: Physician Assistant

## 2025-01-03 ENCOUNTER — Ambulatory Visit (HOSPITAL_COMMUNITY): Payer: MEDICAID

## 2025-01-06 ENCOUNTER — Ambulatory Visit: Payer: Self-pay | Admitting: Pharmacist

## 2025-01-06 ENCOUNTER — Inpatient Hospital Stay: Payer: MEDICAID

## 2025-01-08 ENCOUNTER — Ambulatory Visit: Payer: MEDICAID | Admitting: Urology

## 2025-01-20 ENCOUNTER — Inpatient Hospital Stay: Payer: MEDICAID

## 2025-01-20 ENCOUNTER — Inpatient Hospital Stay: Payer: MEDICAID | Admitting: Internal Medicine

## 2025-02-04 ENCOUNTER — Ambulatory Visit: Admitting: Family Medicine

## 2025-02-10 ENCOUNTER — Inpatient Hospital Stay: Payer: MEDICAID | Attending: Adult Health

## 2025-02-10 ENCOUNTER — Inpatient Hospital Stay: Payer: MEDICAID | Admitting: Physician Assistant

## 2025-02-10 ENCOUNTER — Inpatient Hospital Stay: Payer: MEDICAID

## 2025-03-03 ENCOUNTER — Inpatient Hospital Stay: Payer: MEDICAID | Attending: Adult Health

## 2025-03-03 ENCOUNTER — Inpatient Hospital Stay: Payer: MEDICAID | Admitting: Internal Medicine

## 2025-03-03 ENCOUNTER — Inpatient Hospital Stay: Payer: MEDICAID
